# Patient Record
Sex: Female | Born: 1935
Health system: Southern US, Community
[De-identification: ages and names within clinical notes are randomized; demographics above are authoritative.]

## PROBLEM LIST (undated history)

## (undated) DIAGNOSIS — Z9889 Other specified postprocedural states: Secondary | ICD-10-CM

## (undated) DIAGNOSIS — K922 Gastrointestinal hemorrhage, unspecified: Secondary | ICD-10-CM

## (undated) DIAGNOSIS — K649 Unspecified hemorrhoids: Secondary | ICD-10-CM

## (undated) DIAGNOSIS — K5790 Diverticulosis of intestine, part unspecified, without perforation or abscess without bleeding: Secondary | ICD-10-CM

## (undated) DIAGNOSIS — J4489 Other specified chronic obstructive pulmonary disease: Secondary | ICD-10-CM

## (undated) DIAGNOSIS — E7800 Pure hypercholesterolemia, unspecified: Secondary | ICD-10-CM

## (undated) DIAGNOSIS — R112 Nausea with vomiting, unspecified: Secondary | ICD-10-CM

## (undated) DIAGNOSIS — R011 Cardiac murmur, unspecified: Secondary | ICD-10-CM

## (undated) DIAGNOSIS — D649 Anemia, unspecified: Secondary | ICD-10-CM

## (undated) DIAGNOSIS — R0902 Hypoxemia: Secondary | ICD-10-CM

## (undated) DIAGNOSIS — J449 Chronic obstructive pulmonary disease, unspecified: Secondary | ICD-10-CM

## (undated) DIAGNOSIS — T8859XA Other complications of anesthesia, initial encounter: Secondary | ICD-10-CM

## (undated) DIAGNOSIS — D122 Benign neoplasm of ascending colon: Secondary | ICD-10-CM

## (undated) DIAGNOSIS — I10 Essential (primary) hypertension: Secondary | ICD-10-CM

## (undated) DIAGNOSIS — Z923 Personal history of irradiation: Secondary | ICD-10-CM

## (undated) DIAGNOSIS — D120 Benign neoplasm of cecum: Secondary | ICD-10-CM

## (undated) HISTORY — PX: COLECTOMY: SHX59

## (undated) HISTORY — DX: Essential (primary) hypertension: I10

## (undated) HISTORY — DX: Anemia, unspecified: D64.9

## (undated) HISTORY — DX: Diverticulosis of intestine, part unspecified, without perforation or abscess without bleeding: K57.90

## (undated) HISTORY — DX: Chronic obstructive pulmonary disease, unspecified: J44.9

## (undated) HISTORY — DX: Unspecified hemorrhoids: K64.9

## (undated) HISTORY — DX: Benign neoplasm of cecum: D12.0

## (undated) HISTORY — DX: Personal history of irradiation: Z92.3

## (undated) HISTORY — DX: Other specified chronic obstructive pulmonary disease: J44.89

## (undated) HISTORY — PX: BUNIONECTOMY: SHX129

## (undated) HISTORY — DX: Cardiac murmur, unspecified: R01.1

## (undated) HISTORY — PX: TONSILLECTOMY: SUR1361

## (undated) HISTORY — DX: Gastrointestinal hemorrhage, unspecified: K92.2

## (undated) HISTORY — DX: Hypoxemia: R09.02

## (undated) HISTORY — DX: Benign neoplasm of ascending colon: D12.2

## (undated) HISTORY — PX: JOINT REPLACEMENT: SHX530

## (undated) HISTORY — DX: Pure hypercholesterolemia, unspecified: E78.00

---

## 2000-05-10 ENCOUNTER — Encounter: Admission: RE | Admit: 2000-05-10 | Discharge: 2000-05-10 | Payer: Self-pay | Admitting: Family Medicine

## 2000-05-10 ENCOUNTER — Encounter: Payer: Self-pay | Admitting: Family Medicine

## 2000-07-27 ENCOUNTER — Inpatient Hospital Stay (HOSPITAL_COMMUNITY): Admission: EM | Admit: 2000-07-27 | Discharge: 2000-07-30 | Payer: Self-pay | Admitting: Emergency Medicine

## 2000-07-27 ENCOUNTER — Encounter (INDEPENDENT_AMBULATORY_CARE_PROVIDER_SITE_OTHER): Payer: Self-pay

## 2001-03-02 ENCOUNTER — Encounter: Payer: Self-pay | Admitting: Cardiology

## 2001-03-02 ENCOUNTER — Ambulatory Visit (HOSPITAL_COMMUNITY): Admission: RE | Admit: 2001-03-02 | Discharge: 2001-03-02 | Payer: Self-pay | Admitting: Cardiology

## 2001-06-13 ENCOUNTER — Encounter: Payer: Self-pay | Admitting: Family Medicine

## 2001-06-13 ENCOUNTER — Encounter: Admission: RE | Admit: 2001-06-13 | Discharge: 2001-06-13 | Payer: Self-pay | Admitting: Family Medicine

## 2002-02-23 ENCOUNTER — Ambulatory Visit (HOSPITAL_COMMUNITY): Admission: RE | Admit: 2002-02-23 | Discharge: 2002-02-23 | Payer: Self-pay | Admitting: Internal Medicine

## 2002-02-23 ENCOUNTER — Encounter: Payer: Self-pay | Admitting: Internal Medicine

## 2002-02-26 ENCOUNTER — Ambulatory Visit (HOSPITAL_COMMUNITY): Admission: RE | Admit: 2002-02-26 | Discharge: 2002-02-26 | Payer: Self-pay | Admitting: Internal Medicine

## 2002-02-26 ENCOUNTER — Encounter: Payer: Self-pay | Admitting: Internal Medicine

## 2002-06-21 ENCOUNTER — Encounter: Payer: Self-pay | Admitting: Family Medicine

## 2002-06-21 ENCOUNTER — Ambulatory Visit (HOSPITAL_COMMUNITY): Admission: RE | Admit: 2002-06-21 | Discharge: 2002-06-21 | Payer: Self-pay | Admitting: Family Medicine

## 2003-01-01 ENCOUNTER — Ambulatory Visit (HOSPITAL_COMMUNITY): Admission: RE | Admit: 2003-01-01 | Discharge: 2003-01-01 | Payer: Self-pay | Admitting: Internal Medicine

## 2003-01-01 ENCOUNTER — Encounter: Payer: Self-pay | Admitting: Internal Medicine

## 2003-03-22 ENCOUNTER — Encounter: Payer: Self-pay | Admitting: Internal Medicine

## 2003-03-22 ENCOUNTER — Ambulatory Visit (HOSPITAL_COMMUNITY): Admission: RE | Admit: 2003-03-22 | Discharge: 2003-03-22 | Payer: Self-pay | Admitting: Internal Medicine

## 2003-03-25 ENCOUNTER — Encounter: Payer: Self-pay | Admitting: Internal Medicine

## 2003-03-25 ENCOUNTER — Ambulatory Visit (HOSPITAL_COMMUNITY): Admission: RE | Admit: 2003-03-25 | Discharge: 2003-03-25 | Payer: Self-pay | Admitting: Internal Medicine

## 2003-05-07 ENCOUNTER — Encounter: Payer: Self-pay | Admitting: Neurosurgery

## 2003-05-07 ENCOUNTER — Ambulatory Visit (HOSPITAL_COMMUNITY): Admission: RE | Admit: 2003-05-07 | Discharge: 2003-05-07 | Payer: Self-pay | Admitting: Neurosurgery

## 2003-08-13 ENCOUNTER — Encounter: Payer: Self-pay | Admitting: Internal Medicine

## 2003-08-13 ENCOUNTER — Ambulatory Visit (HOSPITAL_COMMUNITY): Admission: RE | Admit: 2003-08-13 | Discharge: 2003-08-13 | Payer: Self-pay | Admitting: Internal Medicine

## 2004-04-15 ENCOUNTER — Ambulatory Visit (HOSPITAL_COMMUNITY): Admission: RE | Admit: 2004-04-15 | Discharge: 2004-04-15 | Payer: Self-pay | Admitting: Internal Medicine

## 2004-04-16 ENCOUNTER — Ambulatory Visit (HOSPITAL_COMMUNITY): Admission: RE | Admit: 2004-04-16 | Discharge: 2004-04-16 | Payer: Self-pay | Admitting: Internal Medicine

## 2004-09-22 ENCOUNTER — Ambulatory Visit (HOSPITAL_COMMUNITY): Admission: RE | Admit: 2004-09-22 | Discharge: 2004-09-22 | Payer: Self-pay | Admitting: Internal Medicine

## 2005-04-23 ENCOUNTER — Ambulatory Visit (HOSPITAL_COMMUNITY): Admission: RE | Admit: 2005-04-23 | Discharge: 2005-04-23 | Payer: Self-pay | Admitting: Internal Medicine

## 2005-06-16 ENCOUNTER — Ambulatory Visit: Payer: Self-pay | Admitting: Internal Medicine

## 2005-07-07 ENCOUNTER — Ambulatory Visit: Payer: Self-pay | Admitting: Internal Medicine

## 2005-10-19 ENCOUNTER — Ambulatory Visit (HOSPITAL_COMMUNITY): Admission: RE | Admit: 2005-10-19 | Discharge: 2005-10-19 | Payer: Self-pay | Admitting: Internal Medicine

## 2006-05-24 ENCOUNTER — Ambulatory Visit (HOSPITAL_COMMUNITY): Admission: RE | Admit: 2006-05-24 | Discharge: 2006-05-24 | Payer: Self-pay | Admitting: Ophthalmology

## 2006-06-07 ENCOUNTER — Ambulatory Visit (HOSPITAL_COMMUNITY): Admission: RE | Admit: 2006-06-07 | Discharge: 2006-06-07 | Payer: Self-pay | Admitting: Ophthalmology

## 2006-06-28 ENCOUNTER — Observation Stay (HOSPITAL_COMMUNITY): Admission: EM | Admit: 2006-06-28 | Discharge: 2006-07-01 | Payer: Self-pay | Admitting: Emergency Medicine

## 2006-06-30 ENCOUNTER — Ambulatory Visit: Payer: Self-pay | Admitting: Internal Medicine

## 2006-11-16 ENCOUNTER — Ambulatory Visit (HOSPITAL_COMMUNITY): Admission: RE | Admit: 2006-11-16 | Discharge: 2006-11-16 | Payer: Self-pay | Admitting: Internal Medicine

## 2007-07-20 ENCOUNTER — Ambulatory Visit (HOSPITAL_COMMUNITY): Admission: RE | Admit: 2007-07-20 | Discharge: 2007-07-20 | Payer: Self-pay | Admitting: Internal Medicine

## 2007-07-21 ENCOUNTER — Ambulatory Visit (HOSPITAL_COMMUNITY): Admission: RE | Admit: 2007-07-21 | Discharge: 2007-07-21 | Payer: Self-pay | Admitting: Internal Medicine

## 2007-11-24 ENCOUNTER — Ambulatory Visit (HOSPITAL_COMMUNITY): Admission: RE | Admit: 2007-11-24 | Discharge: 2007-11-24 | Payer: Self-pay | Admitting: Internal Medicine

## 2008-08-20 ENCOUNTER — Ambulatory Visit (HOSPITAL_COMMUNITY): Admission: RE | Admit: 2008-08-20 | Discharge: 2008-08-20 | Payer: Self-pay | Admitting: Internal Medicine

## 2008-12-03 ENCOUNTER — Ambulatory Visit (HOSPITAL_COMMUNITY): Admission: RE | Admit: 2008-12-03 | Discharge: 2008-12-03 | Payer: Self-pay | Admitting: Internal Medicine

## 2009-09-23 ENCOUNTER — Ambulatory Visit (HOSPITAL_COMMUNITY): Admission: RE | Admit: 2009-09-23 | Discharge: 2009-09-23 | Payer: Self-pay | Admitting: Family Medicine

## 2010-03-02 ENCOUNTER — Ambulatory Visit (HOSPITAL_COMMUNITY): Admission: RE | Admit: 2010-03-02 | Discharge: 2010-03-02 | Payer: Self-pay | Admitting: Internal Medicine

## 2011-04-02 NOTE — Discharge Summary (Signed)
Kathleen Pope, Kathleen Pope                  ACCOUNT NO.:  [id number]   MEDICAL RECORD NO.:  [id number]          PATIENT TYPE:  INP   LOCATION:  1621                         FACILITY:  [facility]   PHYSICIAN:  Lonia Blood, M.D.      DATE OF BIRTH:  [birth year]   DATE OF ADMISSION:  06/28/2006  DATE OF DISCHARGE:  07/01/2006                                 DISCHARGE SUMMARY   PRIMARY CARE PHYSICIAN:  Madelin Rear. Sherwood Gambler, M.D.   GASTROENTEROLOGIST:  Barbette Hair. Arlyce Dice, MD, [facility]   DISCHARGE DIAGNOSES:  1. Lower gastrointestinal bleed secondary to an arteriovenous      malformation.  2. Acute blood loss anemia.  3. Hypertension.  4. Tobacco abuse.   DISCHARGE MEDICATIONS:  1. Norvasc 5 mg daily.  2. Iron tablet, two tablets daily.  3. Centrum Silver one daily.  4. Calcium one daily.  5. Echinacea one daily.   DISPOSITION:  The patient is currently stable.  Her hemoglobin is stable at  10.4.  No further bleeds and back to her baseline.  She will be discharged  home to resume her care with her primary care physician and schedule  followup with Dr. Arlyce Dice as needed.   PROCEDURES PERFORMED:  Colonoscopy performed by Dr. Melvia Heaps on June 29, 2006 that showed a bleeding AVM in the cecum.  Laser therapy was used to  ablate it, and it was obliterated.  Another cecal AVM was not bleeding and  was also ablated.   CONSULTATIONS:  Dr. Melvia Heaps, [facility].   HISTORY OF PRESENT ILLNESS:  Please refer to the dictated history and  physical by Dr. Isidor Holts.  In short, however, this is a 77 year old  female who has a prior history of lower GI bleed due to AVM and also history  of hypertension and tobacco use.  She came in secondary to having  hematochezia for a couple of days, with no history of black stools.  The  patient was subsequently admitted for management of lower GI bleed.   HOSPITAL COURSE:  Problem 1.  Lower GI bleed.  The patient was admitted to  the intensive  care unit.  She had two wide bore IV's and was started on IV  Protonix.  Serial H&H's were followed.  GI was consulted.  She subsequently  underwent colonoscopy as indicated above, and a bleeding AVM was found in  the cecal region which has been ablated.  Since then, she has been stable,  with her hemoglobin very much stable.  She will therefore be discharged on  her current therapy.  She will have followup as needed with both Dr. Arlyce Dice  and Dr. Sherwood Gambler.   Problem 2.  Acute blood loss anemia.  The patient's hemoglobin on admission  was 12.1.  It gradually dropped to 10.7 and 11.6.  Since her procedure, her  hemoglobin has stayed stable at 10.4.  She will probably need a check in a  few weeks.  She had longstanding anemia and has been taking an iron tablet,  and she should continue.   Problem 3.  Hypertension.  Her blood pressure was for the most part  controlled.  She has been placed back on her low-dose Norvasc.   Problem 4.  Tobacco abuse.  The patient received nicotine patch in the  hospital.  She has been counseled extensively regarding smoking as well.   Otherwise, all medical issues are stable.   DISCHARGE LABORATORY DATA:  White count 6.3, hemoglobin 10.4, platelets 203.      Lonia Blood, M.D.  Electronically Signed     LG/MEDQ  D:  07/01/2006  T:  07/01/2006  Job:  [id number]

## 2011-04-02 NOTE — H&P (Signed)
NAMELILIANAH, Kathleen Pope                  ACCOUNT NO.:  [id number]   MEDICAL RECORD NO.:  [id number]          PATIENT TYPE:  INP   LOCATION:  0104                         FACILITY:  [facility]   PHYSICIAN:  Isidor Holts, M.D.  DATE OF BIRTH:  1933-08-18   DATE OF ADMISSION:  06/28/2006  DATE OF DISCHARGE:                                HISTORY & PHYSICAL   PRIMARY CARE PHYSICIAN:  Madelin Rear. Sherwood Gambler, M.D., [place], [place]  [place].   PRIMARY GASTROENTEROLOGIST:  Wilhemina Bonito. Marina Goodell, M.D.   CHIEF COMPLAINT:  Passage of blood in the stools.   HISTORY OF PRESENT ILLNESS:  This is a 77 year old female. For Past Medical  History, see below.  The patient has had episodes of lower GI bleed in the  past, i.e. 2000-2001 and is currently on long-term oral iron therapy, for  iron-deficiency anemia.  According to the patient, she had a colonoscopy in  October 2006, which was reportedly normal.  She had been quite well until  morning of June 28, 2006, when following breakfast, she went to the  bathroom to move her bowels and had formed stools with some blood on it.  Later, following lunch at about 12:30 p.m., she passed what she felt was  diarrhea, but was really bright red blood which filled the bowl.  Had some  loose motions. She had some lower abdominal pain just prior, but none since.  She has had no further bloody stools since.  Denies vomiting or nausea.   PAST MEDICAL HISTORY:  1. History of lower GI bleed in 2001 secondary to cecal AVM, confirmed by      colonoscopy and treated with hemostatic Bicap therapy.  2. Lower GI bleed late 2000, status post sigmoid resection early 2001 for      presumed diverticular source.  3. Iron-deficiency anemia.  4. History of hemorrhoids.  5. Hypertension.  6. History of prior foot surgery.  7. Status post bilateral cataract surgery with lens implants,      approximately 1 month ago.  8. History of esophageal ring confirmed by EGD September 2001.  9.  Smoker.   MEDICATION HISTORY.:  1. Norvasc 5 mg p.o. daily.  2. Iron tablets 2 pills p.o. daily.  3. Centrum Silver multivitamins one p.o. daily.  4. Calcium supplements one p.o. daily.  5. Echinacea over-the-counter one p.o. daily.   ALLERGIES:  No known drug allergies.   REVIEW OF SYSTEMS:  As per HPI and Chief Complaint, otherwise negative.   SOCIAL HISTORY:  The patient is married, has one daughter.  She is a smoker,  approximately one pack of cigarettes per day, has been smoking for about 50  years.  Denies alcohol use or drug abuse. 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   FAMILY HISTORY:  Is noncontributory.  The patient's mother died at age 61.  She had a history of breast cancer.  Her father died at age 96 years from  emphysema.  He had a history of prostate cancer.   PHYSICAL EXAMINATION:  VITAL SIGNS:  Temperature 100.5, pulse 97 per minute  and regular, respiratory 18, blood pressure 121/80 mmHg, pulse oximeter  100%.  GENERAL:  The patient does not appear to be in obvious acute distress.  Alert, communicative, not short of breath at rest.  HEENT: No clinical pallor or jaundice.  No conjunctival injection.  NECK: Supple.  JVP not seen.  No palpable lymphadenopathy.  No palpable  goiter.  CHEST:  Clinically clear to auscultation.  No wheezes or crackles.  HEART:  Heart sounds 1 and 2 heard, normal, regular, no murmurs.  ABDOMEN: Full, soft and nontender.  Old surgical scar is noted in the lower  abdomen, infraumbilically. There is no palpable organomegaly.  No palpable  masses.  Normal bowel sounds.  LOWER EXTREMITY EXAMINATION:  No pitting edema.  Palpable peripheral pulses.  MUSCULOSKELETAL: Examination was quite unremarkable.  CENTRAL NERVOUS SYSTEM: No focal neurologic deficit on gross examination.   INVESTIGATIONS:  CBC: WBC 6.8, hemoglobin 12.1, hematocrit 36.4, MCV 95.6,  platelets 234.  Electrolytes: Sodium 143,  potassium 3.9, chloride 108, CO2  30, BUN 15, creatinine 0.8, glucose 104. INR 0.9.   ASSESSMENT AND PLAN:  1. Lower gastrointestinal bleed. Query source.  Differential diagnostic      considerations include diverticula, hemorrhoids recurrent arteriovenous      malformation.  Reportedly normal colonoscopic findings in October 2006,      militate against malignancy or neoplasm.  Since the patient appears to      have active bleeding, we shall admit for close monitoring, to the step-      down unit, do serial hemoglobin and hematocrit and request GI      consultation for workup.   1. Hypertension.  This appears controlled at present.  We shall monitor,      and institute antihypertensive medications as indicated.   1. Smoking history.  The patient will be counseled appropriately, and we      shall utilize Nicoderm CQ patch.   Further management will depend on clinical course.      Isidor Holts, M.D.  Electronically Signed     CO/MEDQ  D:  06/28/2006  T:  06/28/2006  Job:  [id number]   cc:   Madelin Rear. Sherwood Gambler, MD  Fax: [phone]   Wilhemina Bonito. Marina Goodell, M.D. [facility]  [address]  [place]  [place] [address]

## 2011-04-02 NOTE — Consult Note (Signed)
Kathleen Pope, Kathleen Pope                  ACCOUNT NO.:  [id number]   MEDICAL RECORD NO.:  [id number]          PATIENT TYPE:  INP   LOCATION:  0165                         FACILITY:  [facility]   PHYSICIAN:  Iva Boop, MD,FACGDATE OF BIRTH:  10/20/1933   DATE OF CONSULTATION:  06/28/2006  DATE OF DISCHARGE:                                   CONSULTATION   REFERRING PHYSICIAN:  Isidor Holts, M.D.   PRIMARY CARE PHYSICIAN:  Kathleen Pope, M.D.   REASON FOR CONSULTATION:  GI bleed.   ASSESSMENT:  Hematochezia/gastrointestinal bleed which I suspect is a lower  bleed.  The patient has a past history of bleeding from cecal angiodysplasia  we should say and also has diverticulosis, both of which could be causing  bleeding.  Her BUN is 15 and creatinine 0.8 which goes against an upper  gastrointestinal bleed.  She is currently hemodynamically stable on a  hemoglobin of 12.   RECOMMENDATIONS/PLAN:  1. Agree with admission.  2. She should have a colonoscopy and possible upper gastrointestinal      endoscopy tomorrow.  We will plan to prep her tomorrow for colonoscopy      to sort out what is going on here.  She may need ablation of AVMs.  She      did have a colonoscopy just last year in August of 2006 which      demonstrated no significant abnormalities.  No diverticulosis was      described but it is possible that she could have diverticulosis not      seen previously.  She is status post a segmental resection for a      presumed diverticular bleed prior to the discovery of her cecal      angiodysplasia.   HISTORY:  A 77 year old woman that was in her usual state of health,  actually had her physical last week and was doing fine.  Today, she passed  some bright red blood and then had more with a diarrheal and bloody stool  with a fairly large amount.  There has been no melena.  Her stools have been  iron colored.  She was hemoccult positive here in the emergency department.  Laboratory studies as above.  She has not been lightheaded or had abdominal  pain or vomiting or change in bowel habits or any significant weight loss.   GI review of systems otherwise negative.   MEDICATIONS:  1. Norvasc 5 mg daily.  2. Iron tablets 2 a day.  3. Centrum Silver daily.  4. Calcium daily.  5. Echinacea.   ALLERGIES:  None known.   PAST MEDICAL HISTORY:  1. As above regarding GI bleeding.  Note, she did have complaints of      bright red blood per rectum prior to her colonoscopy in August of 2006      but there was no mention of hemorrhoid on that exam.  2. Hypertension.  3. Prior sigmoid resection for a presumed diverticular bleed.  4. History of hemorrhoids reported.  5. Hypertension.  6. Prior foot surgery.  7. Cataract  surgery and lens implant one month ago.  8. Prior esophageal ring dilated by EGD 2001.  9. Smoker.   SOCIAL HISTORY:  Married, one daughter, lives with her husband.  She is  retired from [employer].  She is a smoker.  No alcohol.   FAMILY HISTORY:  No colon cancer.   REVIEW OF SYSTEMS:  As above.  Otherwise all other systems are negative.   PHYSICAL EXAMINATION:  GENERAL:  Reveals a pleasant petite fit-appearing  elderly white woman in no acute distress.  VITAL SIGNS:  Temperature 98.2, blood pressure 128/70, pulse 78,  respirations 18.  HEENT:  Eyes anicteric.  Mouth free of lesions.  NECK:  Supple.  No thyromegaly or mass.  CHEST:  Clear.  HEART:  S1 and S2.  No rubs or gallops.  Somewhat distant heart sounds.  ABDOMEN:  Thin, soft and nontender.  RECTAL:  Not repeated.  EXTREMITIES:  No edema.  SKIN:  Tanned.  LYMPH NODES:  No neck or supraclavicular nodes.   ADDITIONAL LAB DATA:  INR 0.9.  Remainder of her BMET looks normal.  White  count and platelets normal.  MCV 95.   I appreciate the opportunity to care for this patient.  Dr. Brien Few was covering  for Dr. Sherwood Pope.      Iva Boop, MD,FACG  Electronically Signed      CEG/MEDQ  D:  06/28/2006  T:  06/29/2006  Job:  [id number]   cc:   Kathleen Rear. Sherwood Gambler, MD  Fax: [phone]

## 2011-04-02 NOTE — Procedures (Signed)
[place]  Patient:    Kathleen Pope, Kathleen Pope                           MRN: [id number] Proc. Date: 07/29/00 Adm. Date:  [id number] Disc. Date: [id number] Attending:  Estella Husk CC:         Patrica Duel, M.D., [place], [place]  Dr. Audie Pinto, [place]   Procedure Report  PROCEDURE PERFORMED:  Esophagogastroduodenoscopy  INDICATION:  GI bleeding and iron deficiency anemia.  HISTORY:  A 77 year old female with recurrent GI bleeding and anemia.  She just underwent colonoscopy.  See that dictation. She is now for upper endoscopy.  PHYSICAL EXAMINATION:  Completed prior to colonoscopy. See that dictation.  DESCRIPTION OF PROCEDURE:  After completing colonoscopy, the patient remained in left lateral decubitus position.  Additional sedation was provided with Versed 2.5 mg IV.  The Olympus endoscope was passed orally under direct vision into the esophagus.  The esophagus revealed an incidental fibrous ring at the gastroesophageal junction.  The endoscope passed beyond this without resistance.  The stomach, duodenal bulb, and post bulbar duodenum to the third portion appeared normal.  No evidence of vascular malformation or other mucosal lesion.  IMPRESSION:   Fibrous esophageal ring.  Otherwise normal upper endoscopy. Recent problems with bleeding and anemia secondary to cecal arteriovenous malformation (see colonoscopy report). DD:  07/29/00 TD:  08/01/00 Job: [id number]

## 2011-04-02 NOTE — Procedures (Signed)
[place]  Patient:    Kathleen Pope, Kathleen Pope                           MRN: [id number] Proc. Date: 07/29/00 Adm. Date:  [id number] Disc. Date: [id number] Attending:  Estella Husk CC:         Patrica Duel, M.D., [place], [place]  Dr. Sherwood Gambler, M.D., [place], [place]   Procedure Report  PROCEDURE PERFORMED:  Colonoscopy with polypectomy and hemostatic therapy.  INDICATION:  Recurrent GI bleed.  HISTORY:  This is a 77 year old female with recurrent GI bleeding and recurrent anemia.  Earlier this year, she underwent segmental resection of her sigmoid colon as diverticular source was felt the cause for bleeding.  She was admitted to our hospital two days with recurrent lower GI bleeding.  She was also noted to have iron deficiency anemia.  She has been transfused.  She is now for colonoscopy and upper endoscopy.  The nature of the procedure, as well as the risks, benefits and alternatives were reviewed.  She understood and agreed to proceed.  PHYSICAL EXAMINATION:  GENERAL:  Thin female in no acute distress.  She is alert and oriented.  VITAL SIGNS:  Stable.  LUNGS: Clear.  HEART:  Regular.  ABDOMEN:  Soft.  DESCRIPTION OF PROCEDURE:  After informed consent was obtained, the patient was sedated with 100 mcg of Fentanyl and 7.5 mg of Versed IV.  Rectal exam was performed and revealed no external hemorrhoids.  The Olympus colonoscope was then passed under direct vision per rectum and advanced through the entire length of the colon to the cecal tip.  Preparation was excellent.  In the base of the cecum was a small actively bleeding vascular malformation.  A 7 French bicap probe was used to coagulate the lesion.  Complete hemostasis was achieved.  Photographs were taken pre and post therapy.  The remainder of the colon was unremarkable.  She did have prominent vascular pattern throughout. Nothing that would be characterized as true vascular  malformation.  At approximately 25 cm from the anal verge was healthy appearing surgical anastomosis.  In the rectum was 8 mm adenomatous appearing polyp which was removed with hot biopsy forceps and submitted for pathologic analysis. Retroflexed view of the rectum demonstrated small internal hemorrhoids.  In addition, middle hemorrhoids were noted.  IMPRESSION: 1. Iron deficiency and recurrent lower gastrointestinal bleeding secondary    to vascular malformation of the cecum status post endoscopic hemostatic    therapy. 2. Rectal polyp status post polypectomy. 3. Status post sigmoid colectomy. 4. Innocuous appearing hemorrhoids.  RECOMMENDATIONS: 1. Continue iron therapy t.i.d 2. Observe for evidence of rebleeding. 3. Avoid aspirin and nonsteroidal products. 4. Follow up polyp pathology.  If adenomatous than repeat colonoscopy    in three years. 5. Proceed to upper endoscopy to exclude upper gastrointestinal vascular    malformations. DD:  07/29/00 TD:  08/01/00 Job: [id number]

## 2011-04-02 NOTE — Discharge Summary (Signed)
[place]  Patient:    Kathleen Pope, Kathleen Pope                           MRN: [id number] Adm. Date:  [id number] Disc. Date: [id number] Attending:  Estella Husk Dictator:   Dianah Field, P.A. CC:         Conception Chancy, M.D., [address], [place], [place]  Patrica Duel, M.D., [address], [place], [place] [address]  Barbette Hair. Arlyce Dice, M.D. [facility]   Discharge Summary  ADMISSION DIAGNOSES: 1. Lower gastrointestinal bleed.  Rule out diverticula, rule out hemorrhoidal,    rule out ischemic, though doubt the latter because this is painless and the    bleeding has been chronic.  Rule out arteriovenous malformations, less    likely this represents and upper gastrointestinal source. 2. Anemia, symptomatic with presyncopal symptoms. 3. Slightly increased blood urea nitrogen.  Rule out secondary to volume    contraction, prerenal source versus upper gastrointestinal bleeding source. 4. Status post sigmoid resection for bleeding presumed to be diverticular in    source. 5. History of hemorrhoids to which bleeding has been attributed in the past. 6. Status post foot surgery. 7. Hypertension. 8. Headaches. 9. Status post foot surgery.  DISCHARGE DIAGNOSES: 1. Acute gastrointestinal bleeding of large volumes secondary to bleeding    cecal arteriovenous malformation.  Status post colonoscopy with hemostatic    therapy with hemostatic bicap therapy. 2. Status post rectal polypectomy with pathology pending at the time of this    dictation. 3. Anemia requiring transfusion with 3 units of packed red blood cells during    this admission.  Hemoglobin and hematocrit stable following these    transfusions. 4. Elevated blood urea nitrogen, resolved with hydration and transfusions. 5. Elevated glucose during this admission.  Rule out glucose intolerance/adult    onset diabetes mellitus.  This was not worked up during this admission.  CONSULTATIONS:   None.  PROCEDURES: 1. Colonoscopy with Dr. Yancey Flemings on July 29, 2000:  This showed    anastomosis at 25 cm, a rectal polyp was removed with hot biopsy and sent    for pathology.  In the cecum a bleeding AVM was seen.  This was treated    with bicap therapy. 2. Upper endoscopy on July 27, 2000:  This showed incidental esophageal    ring; otherwise, the study was normal to the third duodenum with no    evidence for AVM.  BRIEF HISTORY:  Kathleen Pope is a 77 year old white female with a history of GI bleeding attributed to both diverticulosis and hemorrhoids in the past.  She had a fairly large bleed in the spring of 2001 and ended up having a sigmoid colon resection for bleeding which was presumed to be diverticular in origin. Dr. Lovell Sheehan performed this in [place] in March 2001.  Endoscopy studies have included colonoscopy about three to four years ago by Dr. Arlyce Dice at which time hemorrhoids were found and felt to be the source of her intermittent mild to moderate bleeding.  Dr. Lovell Sheehan has also performed colonoscopies on the patient in the past.  Since her surgery the patient has had resumption of mild to moderate rectal bleeding which is pretty well controlled with hemorrhoid therapy.  She got frustrated with this and bleeding was becoming somewhat refractory to suppository therapy so she went back to see Dr. Arlyce Dice about a week ago in the office.  He set  her up for colonoscopy and endoscopy in about two weeks; however, on the evening prior to admission and on the morning of admission the patient developed more large scale painless hematochezia.  She became dizzy and weak.  She went to see Dr. Carver Fila at his office in [place].  He sent her to the hospital for transfusions.  However, there were no blood products available for transfusion for this patient so she was sent on to the emergency room at [facility].  There she was intercepted to the emergency department  by Dr. Yancey Flemings, covering the hospital for Dr. Arlyce Dice.  A review of the records show that her blood pressure had been 90/60 during the ambulance ride with a pulse of 110.  She was started on IV fluids with a bolus of normal saline while being transported.  By the time she arrived at [facility] blood pressure was 124/96 with a pulse of 96.  She was not unstable during transport or in the ER.  Dr. Marina Goodell admitted her for transfusions, telemetry monitoring, and studies to include colonoscopy and upper endoscopy once she stabilized and the bleeding slowed down.  Gastric irritant medication product usage includes aspirin daily along with BCs and Aleve on occasion which amounts to about one to two times a week.  She had been on Nu-Iron once daily, but Dr. Arlyce Dice had asked her to stop taking this in anticipation of colonoscopy.  In the emergency room labs did reveal a hemoglobin of 6.3.  Apparently about a month ago it had been somewhere in the 10 to 10.5 range.  Coags were normal.  Her MCV was just minimally reduced.  LABORATORY AND X-RAY DATA:  Hemoglobin was 6.3 on admission and measured 11.8 at discharge.  Hematocrit initially 20.6, 36.5 at discharge.  White blood cell count was 9.6, platelets were 259,000.  PT 13, INR 1.0, PTT 23.  Chemistries were notable for initial BUN of 26, corrected to 15.  Albumin low at 3.1. Bilirubin, alkaline phosphatase, AST, and ALT all within normal limits.  Serum glucoses read 123 and 140.  Sodium, potassium, creatinine were within normal limits.  HOSPITAL COURSE:  The afternoon that the patient came into the hospital she began transfusions with packed red blood cells.  She received a total of 3 units over the next 18 hours.  The following morning her hemoglobin measured 12.1.  Review of her vital signs showed these to be stable without any tachycardia or hypotension.  She had had no further bleeding since she had come to the emergency room.  She was  feeling stronger and had no further  dizziness.  The afternoon of hospital day #2 she drank GoLYTELY prep without incidence.  The following day the colonoscopy and upper endoscopy were performed.  Findings are noted above.  Dr. Marina Goodell had found the source of the bleeding to be a bleeding cecal AVM.  She had no evidence for further diverticular disease.  Anastomosis was widely patent.  He also did remove a rectal polyp.  The pathology was sent, but is pending at time of this dictation.  As the patient has been very stable since her admission and this includes vital signs as well as blood counts, plan is for discharge on the morning of September 15 if she has no further problems.  FOLLOWUP:  Planned follow-up is with Dr. Marina Goodell on Tuesday, October 2 in the afternoon.  DISCHARGE INSTRUCTIONS:  In the meantime she is to follow a low residue diet for  approximately 10 days.  She is to avoid any aspirin or any nonsteroidal products.  She is to call the office if she has any further GI bleeding problems.  MEDICATIONS AT DISCHARGE: 1. Iron sulfate 325 mg one p.o. t.i.d. 2. Norvasc 5 mg p.o. q.d. 3. Tylenol p.r.n.  CONDITION ON DISCHARGE:  Her condition at the time of this dictation was stable. DD:  07/29/00 TD:  08/01/00 Job: [id number]

## 2011-04-22 ENCOUNTER — Other Ambulatory Visit (HOSPITAL_COMMUNITY): Payer: Self-pay | Admitting: Internal Medicine

## 2011-04-22 DIAGNOSIS — Z139 Encounter for screening, unspecified: Secondary | ICD-10-CM

## 2011-05-04 ENCOUNTER — Ambulatory Visit (HOSPITAL_COMMUNITY)
Admission: RE | Admit: 2011-05-04 | Discharge: 2011-05-04 | Disposition: A | Discharge status: Home / Self Care | Payer: Medicare Other | Source: Ambulatory Visit | Attending: Internal Medicine | Admitting: Internal Medicine

## 2011-05-04 DIAGNOSIS — Z139 Encounter for screening, unspecified: Secondary | ICD-10-CM

## 2011-05-04 DIAGNOSIS — Z1231 Encounter for screening mammogram for malignant neoplasm of breast: Secondary | ICD-10-CM | POA: Insufficient documentation

## 2011-07-23 ENCOUNTER — Other Ambulatory Visit (HOSPITAL_COMMUNITY): Payer: Self-pay | Admitting: Internal Medicine

## 2011-07-23 DIAGNOSIS — J449 Chronic obstructive pulmonary disease, unspecified: Secondary | ICD-10-CM

## 2011-07-23 DIAGNOSIS — Z01419 Encounter for gynecological examination (general) (routine) without abnormal findings: Secondary | ICD-10-CM

## 2011-07-23 DIAGNOSIS — Z0000 Encounter for general adult medical examination without abnormal findings: Secondary | ICD-10-CM

## 2011-07-27 ENCOUNTER — Ambulatory Visit (HOSPITAL_COMMUNITY)
Admission: RE | Admit: 2011-07-27 | Discharge: 2011-07-27 | Disposition: A | Discharge status: Home / Self Care | Payer: Medicare Other | Source: Ambulatory Visit | Attending: Internal Medicine | Admitting: Internal Medicine

## 2011-07-27 DIAGNOSIS — I6529 Occlusion and stenosis of unspecified carotid artery: Secondary | ICD-10-CM | POA: Insufficient documentation

## 2011-07-27 DIAGNOSIS — R0989 Other specified symptoms and signs involving the circulatory and respiratory systems: Secondary | ICD-10-CM | POA: Insufficient documentation

## 2011-07-27 DIAGNOSIS — J449 Chronic obstructive pulmonary disease, unspecified: Secondary | ICD-10-CM

## 2011-07-27 DIAGNOSIS — Z0000 Encounter for general adult medical examination without abnormal findings: Secondary | ICD-10-CM

## 2011-07-27 DIAGNOSIS — Z87891 Personal history of nicotine dependence: Secondary | ICD-10-CM | POA: Insufficient documentation

## 2011-07-27 DIAGNOSIS — H538 Other visual disturbances: Secondary | ICD-10-CM | POA: Insufficient documentation

## 2011-07-27 DIAGNOSIS — Z01419 Encounter for gynecological examination (general) (routine) without abnormal findings: Secondary | ICD-10-CM

## 2011-07-27 DIAGNOSIS — I10 Essential (primary) hypertension: Secondary | ICD-10-CM | POA: Insufficient documentation

## 2011-07-27 DIAGNOSIS — R55 Syncope and collapse: Secondary | ICD-10-CM | POA: Insufficient documentation

## 2011-08-13 ENCOUNTER — Other Ambulatory Visit (HOSPITAL_COMMUNITY): Payer: Self-pay | Admitting: Internal Medicine

## 2011-08-17 ENCOUNTER — Ambulatory Visit (HOSPITAL_COMMUNITY): Payer: Medicare Other

## 2012-05-02 ENCOUNTER — Other Ambulatory Visit (HOSPITAL_COMMUNITY): Payer: Self-pay | Admitting: Internal Medicine

## 2012-05-02 DIAGNOSIS — Z139 Encounter for screening, unspecified: Secondary | ICD-10-CM

## 2012-05-05 ENCOUNTER — Ambulatory Visit (HOSPITAL_COMMUNITY)
Admission: RE | Admit: 2012-05-05 | Discharge: 2012-05-05 | Disposition: A | Discharge status: Home / Self Care | Payer: Medicare Other | Source: Ambulatory Visit | Attending: Internal Medicine | Admitting: Internal Medicine

## 2012-05-05 DIAGNOSIS — Z1231 Encounter for screening mammogram for malignant neoplasm of breast: Secondary | ICD-10-CM | POA: Insufficient documentation

## 2012-05-05 DIAGNOSIS — Z139 Encounter for screening, unspecified: Secondary | ICD-10-CM

## 2012-08-01 ENCOUNTER — Other Ambulatory Visit (HOSPITAL_COMMUNITY): Payer: Self-pay | Admitting: Internal Medicine

## 2012-08-01 ENCOUNTER — Ambulatory Visit (HOSPITAL_COMMUNITY)
Admission: RE | Admit: 2012-08-01 | Discharge: 2012-08-01 | Disposition: A | Discharge status: Home / Self Care | Payer: Medicare Other | Source: Ambulatory Visit | Attending: Internal Medicine | Admitting: Internal Medicine

## 2012-08-01 DIAGNOSIS — M899 Disorder of bone, unspecified: Secondary | ICD-10-CM | POA: Insufficient documentation

## 2012-08-01 DIAGNOSIS — R52 Pain, unspecified: Secondary | ICD-10-CM

## 2012-08-01 DIAGNOSIS — M949 Disorder of cartilage, unspecified: Secondary | ICD-10-CM | POA: Insufficient documentation

## 2012-08-01 DIAGNOSIS — M25539 Pain in unspecified wrist: Secondary | ICD-10-CM | POA: Insufficient documentation

## 2012-08-03 ENCOUNTER — Encounter: Payer: Self-pay | Admitting: *Deleted

## 2012-08-04 ENCOUNTER — Encounter: Payer: Self-pay | Admitting: Cardiovascular Disease

## 2012-08-04 ENCOUNTER — Ambulatory Visit (INDEPENDENT_AMBULATORY_CARE_PROVIDER_SITE_OTHER): Payer: Medicare Other | Admitting: Cardiovascular Disease

## 2012-08-04 VITALS — BP 145/84 | HR 82 | Ht 62.0 in | Wt 102.0 lb

## 2012-08-04 DIAGNOSIS — R011 Cardiac murmur, unspecified: Secondary | ICD-10-CM | POA: Insufficient documentation

## 2012-08-04 DIAGNOSIS — I350 Nonrheumatic aortic (valve) stenosis: Secondary | ICD-10-CM

## 2012-08-04 DIAGNOSIS — R0989 Other specified symptoms and signs involving the circulatory and respiratory systems: Secondary | ICD-10-CM

## 2012-08-04 DIAGNOSIS — F17210 Nicotine dependence, cigarettes, uncomplicated: Secondary | ICD-10-CM | POA: Insufficient documentation

## 2012-08-04 DIAGNOSIS — E782 Mixed hyperlipidemia: Secondary | ICD-10-CM | POA: Insufficient documentation

## 2012-08-04 DIAGNOSIS — F17200 Nicotine dependence, unspecified, uncomplicated: Secondary | ICD-10-CM

## 2012-08-04 DIAGNOSIS — I359 Nonrheumatic aortic valve disorder, unspecified: Secondary | ICD-10-CM

## 2012-08-04 NOTE — Patient Instructions (Signed)
Your physician recommends that you schedule a follow-up appointment in: 6 months  Your physician has requested that you have a carotid duplex. This test is an ultrasound of the carotid arteries in your neck. It looks at blood flow through these arteries that supply the brain with blood. Allow one hour for this exam. There are no restrictions or special instructions.  Your physician has requested that you have an echocardiogram. Echocardiography is a painless test that uses sound waves to create images of your heart. It provides your doctor with information about the size and shape of your heart and how well your heart's chambers and valves are working. This procedure takes approximately one hour. There are no restrictions for this procedure.

## 2012-08-04 NOTE — Progress Notes (Signed)
Patient ID: Kathleen Pope, female   DOB: March 09, 1933, 78 y.o.   MRN: [id number] 78 yo smoking since age 39.  No motivation to quit.  Referred by Dr Sherwood Gambler for bruit and murmur.  She indicates these last evaluated 3-4 years ago but I have no records.  No TIA or stroke symptoms.  No chest pain or history of CAD.  Has had murmur before.  She has no claudication.  Compliant with meds and on statin.  Widowed last two years but daughter lives near by.  Mild dyspnea with exertion.  No palpitations or syncope  ROS: Denies fever, malais, weight loss, blurry vision, decreased visual acuity, cough, sputum, SOB, hemoptysis, pleuritic pain, palpitaitons, heartburn, abdominal pain, melena, lower extremity edema, claudication, or rash.  All other systems reviewed and negative   General: Affect appropriate Thin bronchitic female HEENT: normal Neck supple with no adenopathy JVP normal bilateral  bruits no thyromegaly Lungs clear with no wheezing and good diaphragmatic motion Heart:  S1/S2 mild to moderate AS  murmur,rub, gallop or click PMI normal Abdomen: benighn, BS positve, no tenderness, no AAA no bruit.  No HSM or HJR Distal pulses intact with no bruits No edema Neuro non-focal Skin warm and dry No muscular weakness  Medications Current Outpatient Prescriptions  Medication Sig Dispense Refill  . amLODipine (NORVASC) 5 MG tablet Take 5 mg by mouth daily.      Marland Kitchen aspirin 81 MG tablet Take 81 mg by mouth daily.      . calcium carbonate (OS-CAL) 600 MG TABS Take 600 mg by mouth daily.      . ciprofloxacin (CIPRO) 500 MG tablet Take 500 mg by mouth 2 (two) times daily.      . ferrous sulfate 325 (65 FE) MG EC tablet Take 325 mg by mouth 3 (three) times daily with meals.      . fish oil-omega-3 fatty acids 1000 MG capsule Take 1 g by mouth daily.      . Multiple Vitamin (MULTIVITAMIN) tablet Take 1 tablet by mouth daily.      . pravastatin (PRAVACHOL) 10 MG tablet Take 10 mg by mouth daily.         Allergies Review of patient's allergies indicates no known allergies.  Family History: Family History  Problem Relation Age of Onset  . Cancer Mother     Breast Ca  . Cancer Father     Lung Ca  . Cancer Sister     Breast Ca    Social History: History   Social History  . Marital Status: Widowed    Spouse Name: N/A    Number of Children: N/A  . Years of Education: N/A   Occupational History  . Not on file.   Social History Main Topics  . Smoking status: Current Every Day Smoker -- 0.8 packs/day for 59 years    Types: Cigarettes  . Smokeless tobacco: Not on file  . Alcohol Use: No  . Drug Use: No  . Sexually Active: Not on file   Other Topics Concern  . Not on file   Social History Narrative  . No narrative on file    Electrocardiogram:  NSR rate 79  nonspecfic T wave changes  Assessment and Plan

## 2012-08-04 NOTE — Assessment & Plan Note (Signed)
Counseled for less than 10 minutes no motivation to quit.   

## 2012-08-04 NOTE — Assessment & Plan Note (Signed)
Bilateral carotid bruits  Wants duplex done at [facility]  Discussed criteria for surgery  If stenosis more than 40 % by velocity criteria should have duplex q 6 months.  ASA  Gave her Dr Lacinda Axon name for future reference  Relation of smoking to vascular disease discussed

## 2012-08-04 NOTE — Assessment & Plan Note (Signed)
Cholesterol is at goal.  Continue current dose of statin and diet Rx.  No myalgias or side effects.  F/U  LFT's in 6 months. No results found for this basename: LDLCALC  Labs with primary            

## 2012-08-04 NOTE — Assessment & Plan Note (Signed)
Mild to moderate AS murmur No symptoms F/U echo

## 2012-08-07 ENCOUNTER — Ambulatory Visit (HOSPITAL_COMMUNITY)
Admission: RE | Admit: 2012-08-07 | Discharge: 2012-08-07 | Disposition: A | Discharge status: Home / Self Care | Payer: Medicare Other | Source: Ambulatory Visit | Attending: Cardiovascular Disease | Admitting: Cardiovascular Disease

## 2012-08-07 DIAGNOSIS — F17200 Nicotine dependence, unspecified, uncomplicated: Secondary | ICD-10-CM | POA: Insufficient documentation

## 2012-08-07 DIAGNOSIS — I359 Nonrheumatic aortic valve disorder, unspecified: Secondary | ICD-10-CM

## 2012-08-07 DIAGNOSIS — I350 Nonrheumatic aortic (valve) stenosis: Secondary | ICD-10-CM

## 2012-08-07 DIAGNOSIS — R0989 Other specified symptoms and signs involving the circulatory and respiratory systems: Secondary | ICD-10-CM | POA: Insufficient documentation

## 2012-08-07 DIAGNOSIS — E785 Hyperlipidemia, unspecified: Secondary | ICD-10-CM | POA: Insufficient documentation

## 2012-08-07 NOTE — Progress Notes (Signed)
  Echocardiogram 2D Echocardiogram has been performed.  Efrat, Zuidema 08/07/2012, 2:32 PM

## 2012-08-08 ENCOUNTER — Other Ambulatory Visit: Payer: Self-pay | Admitting: *Deleted

## 2012-08-08 ENCOUNTER — Telehealth: Payer: Self-pay | Admitting: Internal Medicine

## 2012-08-08 DIAGNOSIS — I350 Nonrheumatic aortic (valve) stenosis: Secondary | ICD-10-CM

## 2012-08-08 DIAGNOSIS — I340 Nonrheumatic mitral (valve) insufficiency: Secondary | ICD-10-CM

## 2012-08-08 NOTE — Telephone Encounter (Signed)
Left message at home number and on cell number to call back.  Pt states that she has had dark stools for the past 2-3 days. Pt states she has had bleeding areas in her colon in the past that had to be taken care of by [facility]. Pt requesting sooner appt .  Pt scheduled to see Dr. Marina Goodell 08/09/12@8 :45am. Pt aware of appt date and time.

## 2012-08-09 ENCOUNTER — Telehealth: Payer: Self-pay

## 2012-08-09 ENCOUNTER — Ambulatory Visit (INDEPENDENT_AMBULATORY_CARE_PROVIDER_SITE_OTHER): Payer: Medicare Other | Admitting: Internal Medicine

## 2012-08-09 ENCOUNTER — Encounter: Payer: Self-pay | Admitting: Internal Medicine

## 2012-08-09 ENCOUNTER — Encounter (HOSPITAL_COMMUNITY): Payer: Self-pay | Admitting: *Deleted

## 2012-08-09 ENCOUNTER — Observation Stay (HOSPITAL_COMMUNITY)
Admission: AD | Admit: 2012-08-09 | Discharge: 2012-08-10 | Disposition: A | Discharge status: Home / Self Care | Payer: Medicare Other | Source: Ambulatory Visit | Attending: Internal Medicine | Admitting: Internal Medicine

## 2012-08-09 VITALS — BP 120/70 | HR 88 | Ht 61.0 in | Wt 103.4 lb

## 2012-08-09 DIAGNOSIS — K5521 Angiodysplasia of colon with hemorrhage: Principal | ICD-10-CM | POA: Insufficient documentation

## 2012-08-09 DIAGNOSIS — E7800 Pure hypercholesterolemia, unspecified: Secondary | ICD-10-CM | POA: Insufficient documentation

## 2012-08-09 DIAGNOSIS — K922 Gastrointestinal hemorrhage, unspecified: Secondary | ICD-10-CM

## 2012-08-09 DIAGNOSIS — K5520 Angiodysplasia of colon without hemorrhage: Secondary | ICD-10-CM

## 2012-08-09 DIAGNOSIS — Z79899 Other long term (current) drug therapy: Secondary | ICD-10-CM | POA: Insufficient documentation

## 2012-08-09 DIAGNOSIS — Q2733 Arteriovenous malformation of digestive system vessel: Secondary | ICD-10-CM

## 2012-08-09 DIAGNOSIS — J4489 Other specified chronic obstructive pulmonary disease: Secondary | ICD-10-CM | POA: Insufficient documentation

## 2012-08-09 DIAGNOSIS — K5730 Diverticulosis of large intestine without perforation or abscess without bleeding: Secondary | ICD-10-CM | POA: Insufficient documentation

## 2012-08-09 DIAGNOSIS — K921 Melena: Secondary | ICD-10-CM

## 2012-08-09 DIAGNOSIS — Z23 Encounter for immunization: Secondary | ICD-10-CM | POA: Insufficient documentation

## 2012-08-09 DIAGNOSIS — J449 Chronic obstructive pulmonary disease, unspecified: Secondary | ICD-10-CM | POA: Insufficient documentation

## 2012-08-09 DIAGNOSIS — Z7982 Long term (current) use of aspirin: Secondary | ICD-10-CM | POA: Insufficient documentation

## 2012-08-09 DIAGNOSIS — E785 Hyperlipidemia, unspecified: Secondary | ICD-10-CM | POA: Insufficient documentation

## 2012-08-09 DIAGNOSIS — I10 Essential (primary) hypertension: Secondary | ICD-10-CM | POA: Insufficient documentation

## 2012-08-09 DIAGNOSIS — D649 Anemia, unspecified: Secondary | ICD-10-CM | POA: Insufficient documentation

## 2012-08-09 DIAGNOSIS — K648 Other hemorrhoids: Secondary | ICD-10-CM | POA: Insufficient documentation

## 2012-08-09 LAB — HEMOGLOBIN AND HEMATOCRIT, BLOOD: HCT: 41.5 % (ref 36.0–46.0)

## 2012-08-09 LAB — CBC
MCHC: 34.8 g/dL (ref 30.0–36.0)
RDW: 14.1 % (ref 11.5–15.5)

## 2012-08-09 LAB — COMPREHENSIVE METABOLIC PANEL
ALT: 17 U/L (ref 0–35)
Albumin: 4.2 g/dL (ref 3.5–5.2)
Alkaline Phosphatase: 72 U/L (ref 39–117)
Potassium: 4.7 mEq/L (ref 3.5–5.1)
Sodium: 142 mEq/L (ref 135–145)
Total Protein: 7.5 g/dL (ref 6.0–8.3)

## 2012-08-09 LAB — PROTIME-INR: Prothrombin Time: 11.9 seconds (ref 11.6–15.2)

## 2012-08-09 LAB — PREPARE RBC (CROSSMATCH)

## 2012-08-09 MED ORDER — INFLUENZA VIRUS VACC SPLIT PF IM SUSP
0.5000 mL | INTRAMUSCULAR | Status: AC
  Administered 2012-08-10: 0.5 mL via INTRAMUSCULAR
  Filled 2012-08-09: qty 0.5

## 2012-08-09 MED ORDER — PEG-KCL-NACL-NASULF-NA ASC-C 100 G PO SOLR
1.0000 | Freq: Once | ORAL | Status: AC
  Administered 2012-08-09: 100 g via ORAL
  Filled 2012-08-09: qty 1

## 2012-08-09 MED ORDER — KCL IN DEXTROSE-NACL 10-5-0.45 MEQ/L-%-% IV SOLN
INTRAVENOUS | Status: DC
  Administered 2012-08-09: 14:00:00 via INTRAVENOUS
  Administered 2012-08-10: 1000 mL via INTRAVENOUS
  Filled 2012-08-09 (×4): qty 1000

## 2012-08-09 MED ORDER — SIMVASTATIN 10 MG PO TABS
10.0000 mg | ORAL_TABLET | Freq: Every day | ORAL | Status: DC
  Administered 2012-08-09: 10 mg via ORAL
  Filled 2012-08-09 (×2): qty 1

## 2012-08-09 MED ORDER — HYDROMORPHONE HCL PF 1 MG/ML IJ SOLN: 0.5000 mg | INTRAMUSCULAR | Status: DC | PRN

## 2012-08-09 MED ORDER — ONDANSETRON HCL 4 MG/2ML IJ SOLN: 4.0000 mg | Freq: Four times a day (QID) | INTRAMUSCULAR | Status: DC | PRN

## 2012-08-09 MED ORDER — PANTOPRAZOLE SODIUM 40 MG PO TBEC
40.0000 mg | DELAYED_RELEASE_TABLET | Freq: Every day | ORAL | Status: DC
  Filled 2012-08-09: qty 1

## 2012-08-09 MED ORDER — ONDANSETRON HCL 4 MG PO TABS: 4.0000 mg | ORAL_TABLET | Freq: Four times a day (QID) | ORAL | Status: DC | PRN

## 2012-08-09 MED ORDER — ACETAMINOPHEN 650 MG RE SUPP: 650.0000 mg | Freq: Four times a day (QID) | RECTAL | Status: DC | PRN

## 2012-08-09 MED ORDER — ACETAMINOPHEN 325 MG PO TABS: 650.0000 mg | ORAL_TABLET | Freq: Four times a day (QID) | ORAL | Status: DC | PRN

## 2012-08-09 MED ORDER — HYDROCODONE-ACETAMINOPHEN 5-325 MG PO TABS: 1.0000 | ORAL_TABLET | ORAL | Status: DC | PRN

## 2012-08-09 NOTE — Patient Instructions (Addendum)
You have been given a separate informational sheet regarding your tobacco use, the importance of quitting and local resources to help you quit.  You will be hearing from the admitting department for [facility] later today to let you know they have a bed for you.

## 2012-08-09 NOTE — H&P (Signed)
Primary Care Physician:  FUSCO,LAWRENCE J., MD Primary Gastroenterologist:  Dr. John Perry  CHIEF COMPLAINT:  Gi bleeding  HPI: Kathleen Pope is a 78 y.o. female , known to Dr. Perry, but not seen over the past several years. She has history of hypertension, hyperlipidemia, COPD, and recurrent GI bleeding felt secondary to colonic AVMs . Patient has remote history of GI bleed dating back 10-15 years, and at that time her bleeding was felt to be diverticular, and she underwent a sigmoid colectomy. Unfortunately her bleeding recurred thereafter and she was transferred to our institution. She had colonoscopy which showed bleeding AVMs in the cecum. She underwent endoscopic treatment with cauterization and did well thereafter without any problems. She has been maintained on chronic iron therapy. She had colonoscopy in 2006 for asymptomatic Hemoccult positive stool without anemia ,at that time  no significant abnormalities were noted. In August of 2008 she had of recurrent hematochezia and underwent colonoscopy while she was hospitalized. At that time she was found to have 2 cecal AVMs which were treated endoscopically.  Last week patient had a routine physical and was feeling well however about 3 days ago she started having dark stools that she states were "leaching out red blood" i, also noted occasional red streaks within the dark stool. Patient feels that this is typical for her bleeding. She has no complaints of shortness of breath, dizziness, chest pain or abdominal pain. Her GI review of systems was otherwise negative . He does take a baby ASA  everyday She was seen in the office this morning by Dr. Perry and decision was made to admit her for recurrent lower GI bleeding probably secondary to colonic AVM's.    Past Medical History:   Chronic airway obstruction, not elsewhere clas*              Lower GI bleed                                  07/26/2000    HTN (hypertension)                                            Hypercholesteremia                                           Anemia                                                       COPD (chronic obstructive pulmonary disease)                Past Surgical History:   COLECTOMY                                                    TONSILLECTOMY                                                  BUNIONECTOMY                                                   Comment:left  Prior to Admission medications : Medication amLODipine (NORVASC) 5 MG tablet, Sig Take 5 mg by mouth daily., Start Date , End Date , Taking? Yes, Authorizing Provider Historical Provider, MD  Medication aspirin 81 MG tablet, Sig Take 81 mg by mouth daily., Start Date , End Date , Taking? Yes, Authorizing Provider Historical Provider, MD  Medication calcium carbonate (OS-CAL) 600 MG TABS, Sig Take 600 mg by mouth daily., Start Date , End Date , Taking? Yes, Authorizing Provider Historical Provider, MD  Medication ferrous sulfate 325 (65 FE) MG EC tablet, Sig Take 325 mg by mouth 3 (three) times daily with meals., Start Date , End Date , Taking? Yes, Authorizing Provider Historical Provider, MD  Medication fish oil-omega-3 fatty acids 1000 MG capsule, Sig Take 1 g by mouth daily., Start Date , End Date , Taking? Yes, Authorizing Provider Historical Provider, MD  Medication Multiple Vitamin (MULTIVITAMIN) tablet, Sig Take 1 tablet by mouth daily., Start Date , End Date , Taking? Yes, Authorizing Provider Historical Provider, MD  Medication pravastatin (PRAVACHOL) 10 MG tablet, Sig Take 10 mg by mouth daily., Start Date , End Date , Taking? Yes, Authorizing Provider Historical Provider, MD    Current Outpatient Prescriptions: amLODipine (NORVASC) 5 MG tablet, Take 5 mg by mouth daily., Disp: , Rfl:  aspirin 81 MG tablet, Take 81 mg by mouth daily., Disp: , Rfl:  calcium carbonate (OS-CAL) 600 MG TABS, Take 600 mg by mouth daily., Disp: , Rfl:  ferrous sulfate 325 (65 FE) MG EC  tablet, Take 325 mg by mouth 3 (three) times daily with meals., Disp: , Rfl:  fish oil-omega-3 fatty acids 1000 MG capsule, Take 1 g by mouth daily., Disp: , Rfl:  Multiple Vitamin (MULTIVITAMIN) tablet, Take 1 tablet by mouth daily., Disp: , Rfl:  pravastatin (PRAVACHOL) 10 MG tablet, Take 10 mg by mouth daily., Disp: , Rfl:     Allergies as of 08/09/2012 (No Known Allergies)  - Review Complete 08/09/2012   Review of patient's family history indicates:   Breast cancer                  Mother                   Lung cancer                    Father                   Breast cancer                  Sister                   Anuerysm                       Brother                    Comment: in stomach   Social History   Marital Status: Widowed             Spouse Name:                        Years of Education:                 Number of children: 1           Occupational History Occupation          Employer            Comment              retired                                   Social History Main Topics   Smoking Status: Current Every Day Smoker        Packs/Day: .8    Years: 59        Types: Cigarettes   Smokeless Status: Never Used                       Alcohol Use: No             Drug Use: No             Sexual Activity: Not on file        Other Topics            Concern   None on file  Social History Narrative   None on file    Review of Systems: Pertinent positive and negative review of systems were noted in the above HPI section.  All other review of systems was otherwise negative.  Physical Exam: Vital signs in last 24 hours: Blood pressure 120/70 pulse 88 height 5 foot 1 weight 103   General:   Alert,  well-developed, cooperative female in NAD Head:  Normocephalic and atraumatic. Eyes:  Sclera clear, no icterus.   Conjunctiva pink. Ears:  Normal auditory acuity. Mouth:  No deformity or lesions.  Oropharynx pink & moist. Neck:  Supple; no masses or  thyromegaly. Lungs:  Clear throughout to auscultation.   No wheezes, crackles, or rhonchi. No acute distress. Heart:  Regular rate and rhythm; no murmurs. Abdomen:  Soft, nontender and nondistended. No masses, hepatomegaly. No obvious masses.  Normal bowel sounds.    Rectal:  Per Dr. Perry  dark and. cherry-red streaked stool grossly Hemoccult-positive Msk:  Symmetrical without gross deformities. Normal posture. Pulses:  Normal pulses noted. Extremities:  Without edema. Neurologic:  Alert and  oriented x4;  grossly normal neurologically. Skin:  Intact without significant lesions or rashes.  Psych:  Alert and cooperative. Normal mood and affect.  Lab Results: Pending  Studies/Results: Us Carotid Duplex Bilateral  08/07/2012  *RADIOLOGY REPORT*  Clinical Data: Carotid bruit  BILATERAL CAROTID DUPLEX ULTRASOUND  Technique: Gray scale imaging, color Doppler and duplex ultrasound was performed of bilateral carotid and vertebral arteries in the neck.  Comparison:  None.  Criteria:  Quantification of carotid stenosis is based on velocity parameters that correlate the residual internal carotid diameter with NASCET-based stenosis levels, using the diameter of the distal internal carotid lumen as the denominator for stenosis measurement.  The following velocity measurements were obtained:                   PEAK SYSTOLIC/END DIASTOLIC RIGHT ICA:                        76/20cm/sec CCA:                          66/13cm/sec SYSTOLIC ICA/CCA RATIO:     1.15 DIASTOLIC ICA/CCA RATIO:    1.57 ECA:                        87cm/sec  LEFT ICA:                        76/25cm/sec CCA:                        73/11cm/sec SYSTOLIC ICA/CCA RATIO:     1.04 DIASTOLIC ICA/CCA RATIO:    2.22 ECA:                        78cm/sec  Findings:  RIGHT CAROTID ARTERY: Some calcific plaque is noted in the region of the carotid bulb extending into the proximal internal carotid artery.  The waveforms, velocities and flow velocity ratios  however demonstrate no evidence of focal hemodynamically significant stenosis.  RIGHT VERTEBRAL ARTERY:  Antegrade in nature  LEFT CAROTID ARTERY: Some calcific plaque is noted in the region of the carotid bulb extending into the proximal internal carotid artery.  Evaluation of the waveforms, velocities and flow velocity ratios however demonstrate no evidence of focal hemodynamically significant stenosis.  LEFT VERTEBRAL ARTERY:  Antegrade in nature  IMPRESSION: Atherosclerotic plaque bilaterally without focal hemodynamically significant stenosis.   Original Report Authenticated By: MARK L. LUKENS, M.D.     Impression / Plan:  #1  79-year-old female with recurrent lower GI bleeding, probably secondary to known colonic AVMs. Patient stable hemodynamically at the time of admission. #2  previous history of lower GI bleeding secondary to colonic AVMs requiring endoscopic therapy on 2 previous occasions #3 diverticulosis-that is post remote sigmoid colectomy #4 hypertension #5 COPD #6 hyperlipidemia  Plan; patient is admitted to the [facility] service for supportive management Serial hemoglobins and transfusions as indicated She will  need repeat colonoscopy and endoscopic therapy if indicated this admission For details please see the orders.     [email or web address]  Gregroy Dombkowski  08/09/2012, 11:10 AM    

## 2012-08-09 NOTE — Telephone Encounter (Signed)
Spoke with Shaun in Patient Placement and gave him all applicable information.  They are going to call pt as soon as they are sure they have a bed at [facility].

## 2012-08-09 NOTE — Progress Notes (Signed)
HISTORY OF PRESENT ILLNESS:  Kathleen Pope is a 78 y.o. female with hypertension, hyperlipidemia, COPD, and recurrent GI bleeding secondary to colonic AVMs. The patient's history dates back 10-15 years ago when she developed significant recurrent GI bleeding that was initially evaluated elsewhere. The cause was felt to be diverticular in she underwent sigmoid colectomy. Unfortunately, bleeding recurred and she was transferred to our institution. Colonoscopy was performed and revealed bleeding AV malformations of the cecum. These were treated endoscopically with cauterization. She did quite well thereafter without problems. She has been maintained on chronic iron therapy. She did undergo colonoscopy in 2006 for asymptomatic Hemoccult positive stool without anemia. No significant abnormalities identified. Again, she continued on iron. However in August of 2008, she had moderate hematochezia for which she underwent repeat colonoscopy during a three-day hospitalization. At that time, she was found to have 2 cecal AVMs which were treated endoscopically. Last week she underwent her routine annual physical exam with no problems found. However, about 3 days ago she began to notice dark stools that were leaching out red blood. As well occasional red streaks within the dark stool. This was typical for her bleeding. No associated symptoms such as shortness of breath, abdominal pain, or chest pain. She denies dizziness. GI review of systems are otherwise negative.  REVIEW OF SYSTEMS:  All non-GI ROS negative except for smoker's cough  Past Medical History  Diagnosis Date  . Chronic airway obstruction, not elsewhere classified   . Lower GI bleed 07/26/2000  . HTN (hypertension)   . Hypercholesteremia   . Anemia   . COPD (chronic obstructive pulmonary disease)     Past Surgical History  Procedure Date  . Colectomy   . Tonsillectomy   . Bunionectomy     left    Social History TRISH MANCINELLI  reports that she  has been smoking Cigarettes.  She has a 47.2 pack-year smoking history. She has never used smokeless tobacco. She reports that she does not drink alcohol or use illicit drugs.  family history includes Anuerysm in her brother; Breast cancer in her mother and sister; and Lung cancer in her father.  No Known Allergies     PHYSICAL EXAMINATION: Vital signs: BP 120/70  Pulse 88  Ht 5\' 1"  (1.549 m)  Wt 103 lb 6 oz (46.891 kg)  BMI 19.53 kg/m2 General: Well-developed, well-nourished, no acute distress HEENT: Sclerae are anicteric, conjunctiva pink. Oral mucosa intact Lungs: Clear Heart: Regular Abdomen: soft, nontender, nondistended, no obvious ascites, no peritoneal signs, normal bowel sounds. No organomegaly. Rectal: Dark and cherry-red Street Hemoccult-positive stool Extremities: No edema Psychiatric: alert and oriented x3. Cooperative    ASSESSMENT:  #1. Hemodynamically stable low-grade persisting lower GI bleed. Presumably due to AVMs. Last colonoscopy 2007 #2. General medical problems   PLAN:  I think it would be best to admit the patient to the hospital as AVMs generally continue to bleed and less treated. Certainly this is her history. We prep her bowel for colonoscopy with possible therapeutics. I have discussed this with our hospital team GI physician and physician assistant.

## 2012-08-09 NOTE — Plan of Care (Signed)
Problem: Altered GI Function ([place]-1.4) Goal: Adequate hydration Outcome: Progressing Clear liquids

## 2012-08-10 ENCOUNTER — Encounter (HOSPITAL_COMMUNITY)
Admission: AD | Disposition: A | Discharge status: Home / Self Care | Payer: Self-pay | Source: Ambulatory Visit | Attending: Internal Medicine

## 2012-08-10 ENCOUNTER — Encounter (HOSPITAL_COMMUNITY): Payer: Self-pay | Admitting: Gastroenterology

## 2012-08-10 HISTORY — PX: COLONOSCOPY: SHX5424

## 2012-08-10 LAB — HEMOGLOBIN AND HEMATOCRIT, BLOOD: Hemoglobin: 12.9 g/dL (ref 12.0–15.0)

## 2012-08-10 SURGERY — COLONOSCOPY
Anesthesia: Moderate Sedation

## 2012-08-10 MED ORDER — FENTANYL CITRATE 0.05 MG/ML IJ SOLN
INTRAMUSCULAR | Status: DC | PRN
  Administered 2012-08-10: 12.5 ug via INTRAVENOUS
  Administered 2012-08-10: 25 ug via INTRAVENOUS
  Administered 2012-08-10 (×2): 12.5 ug via INTRAVENOUS

## 2012-08-10 MED ORDER — FENTANYL CITRATE 0.05 MG/ML IJ SOLN
INTRAMUSCULAR | Status: AC
  Filled 2012-08-10: qty 2

## 2012-08-10 MED ORDER — INFLUENZA VIRUS VACC SPLIT PF IM SUSP: 0.5000 mL | INTRAMUSCULAR | Status: DC

## 2012-08-10 MED ORDER — BIOTENE DRY MOUTH MT LIQD: 15.0000 mL | Freq: Two times a day (BID) | OROMUCOSAL | Status: DC

## 2012-08-10 MED ORDER — CHLORHEXIDINE GLUCONATE 0.12 % MT SOLN
15.0000 mL | Freq: Two times a day (BID) | OROMUCOSAL | Status: DC
  Filled 2012-08-10 (×3): qty 15

## 2012-08-10 MED ORDER — MIDAZOLAM HCL 5 MG/5ML IJ SOLN
INTRAMUSCULAR | Status: DC | PRN
  Administered 2012-08-10 (×2): 1 mg via INTRAVENOUS
  Administered 2012-08-10: 2 mg via INTRAVENOUS
  Administered 2012-08-10: 1 mg via INTRAVENOUS

## 2012-08-10 MED ORDER — MIDAZOLAM HCL 10 MG/2ML IJ SOLN
INTRAMUSCULAR | Status: AC
  Filled 2012-08-10: qty 2

## 2012-08-10 NOTE — Discharge Summary (Signed)
[facility] Discharge Summary  Name: Kathleen Pope MRN: [id number] DOB: 07/14/1933 78 y.o. 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 Patient currently stable status post colonoscopy and endoscopic ablation of cecal AVMs 08/10/2012 #2 mild anemia secondary to above  Consultations: None  Procedures Performed:  Dg Wrist Complete Left  08/01/2012  *RADIOLOGY REPORT*  Clinical Data: Left wrist pain for 2 months, no known injury  LEFT WRIST - COMPLETE 3+ VIEW  Comparison: None  Findings: Osseous demineralization. Joint spaces preserved. No acute fracture, dislocation, or bone destruction.  IMPRESSION: Osseous demineralization. No acute abnormalities.   Original Report Authenticated By: Lollie Marrow, M.D.    US Carotid Duplex Bilateral  08/07/2012  *RADIOLOGY REPORT*  Clinical Data: Carotid bruit  BILATERAL CAROTID DUPLEX ULTRASOUND  Technique: Wallace Cullens scale imaging, color Doppler and duplex ultrasound was performed of bilateral carotid and vertebral arteries in the neck.  Comparison:  None.  Criteria:  Quantification of carotid stenosis is based on velocity parameters that correlate the residual internal carotid diameter with NASCET-based stenosis levels, using the diameter of the distal internal carotid lumen as the denominator for stenosis measurement.  The following velocity measurements were obtained:                   PEAK SYSTOLIC/END DIASTOLIC RIGHT ICA:                        76/20cm/sec CCA:                        66/13cm/sec SYSTOLIC ICA/CCA RATIO:     1.15 DIASTOLIC ICA/CCA RATIO:    1.57 ECA:                        87cm/sec  LEFT ICA:                        76/25cm/sec CCA:                        73/11cm/sec SYSTOLIC ICA/CCA RATIO:     1.04 DIASTOLIC ICA/CCA RATIO:    2.22 ECA:                         78cm/sec  Findings:  RIGHT CAROTID ARTERY: Some calcific plaque is noted in the region of the carotid bulb extending into the proximal internal carotid artery.  The waveforms, velocities and flow velocity ratios however demonstrate no evidence of focal hemodynamically significant stenosis.  RIGHT VERTEBRAL ARTERY:  Antegrade in nature  LEFT CAROTID ARTERY: Some calcific plaque is noted in the region of the carotid bulb extending into the proximal internal carotid artery.  Evaluation of the waveforms, velocities and flow velocity ratios however demonstrate no evidence of focal hemodynamically significant stenosis.  LEFT VERTEBRAL ARTERY:  Antegrade in nature  IMPRESSION: Atherosclerotic plaque bilaterally without focal hemodynamically significant stenosis.   Original Report Authenticated By: Phillips Odor, M.D.     GI Procedures: Colonoscopy per Dr. Russella Dar 08/10/2012  History/Physical Exam:  See Admission H&P  Admission HPI: Kathleen Pope is a very nice 78 year old white female known to Dr. Marina Goodell who has prior history of lower GI bleeding secondary to  cecal AVMs and had had endoscopic ablation twice in the past. Her last colonoscopy I believe was in 2008. Patient presented to the office yesterday with several day history of black stools very similar to her prior episodes. She had been taking a baby aspirin daily. She had no other GI symptoms. She was admitted with recurrent lower GI bleeding.  Hospital Course by problem list: 28 was admitted on 08/09/2012 with recurrent lower GI bleeding. She was hemodynamically stable and was in the normal range at 14.8 She was scheduled for colonoscopy on the morning of 08/10/2012 and underwent bowel prep the evening prior. Caps tolerated the prep without difficulty was still passing small amounts of dark red blood. On the morning of 9/26 her hemoglobin was 12.9 and she felt fine. Colonoscopy was done per Dr. Russella Dar and she was found to have at least 2 cecal AVMs one  of which was actively oozing in the AVMs were cauterized with good hemostasis. Patient is allowed discharged home this afternoon. She will remain off of all aspirin and NSAIDs for the next 2 weeks. As her hemoglobin is still in the normal range I do not think she needs followup CBC. She will followup with Dr. Wendie Agreste on an as-needed basis for any evidence of recurrent bleeding  Discharge Vitals:  BP 122/68  Pulse 98  Temp 98.4 F (36.9 C) (Oral)  Resp 11  Ht 5\' 1"  (1.549 m)  Wt 103 lb 12.8 oz (47.083 kg)  BMI 19.61 kg/m2  SpO2 97%  Discharge Labs:  Results for orders placed during the hospital encounter of 08/09/12 (from the past 24 hour(s))  PREPARE RBC (CROSSMATCH)     Status: Normal   Collection Time   08/09/12  1:30 PM      Component Value Range   Order Confirmation ORDER PROCESSED BY BLOOD BANK    TYPE AND SCREEN     Status: Normal (Preliminary result)   Collection Time   08/09/12  2:00 PM      Component Value Range   ABO/RH(D) B POS     Antibody Screen NEG     Sample Expiration 08/12/2012     Unit Number [id number]     Blood Component Type RED CELLS,LR     Unit division 00     Status of Unit ALLOCATED     Transfusion Status OK TO TRANSFUSE     Crossmatch Result Compatible     Unit Number [id number]     Blood Component Type RED CELLS,LR     Unit division 00     Status of Unit ALLOCATED     Transfusion Status OK TO TRANSFUSE     Crossmatch Result Compatible    CBC     Status: Normal   Collection Time   08/09/12  2:00 PM      Component Value Range   WBC 9.6  4.0 - 10.5 K/uL   RBC 4.39  3.87 - 5.11 MIL/uL   Hemoglobin 14.3  12.0 - 15.0 g/dL   HCT 30.8  65.7 - 84.6 %   MCV 93.6  78.0 - 100.0 fL   MCH 32.6  26.0 - 34.0 pg   MCHC 34.8  30.0 - 36.0 g/dL   RDW 96.2  95.2 - 84.1 %   Platelets 313  150 - 400 K/uL  COMPREHENSIVE METABOLIC PANEL     Status: Abnormal   Collection Time   08/09/12  2:00 PM      Component Value Range   Sodium  142  135 - 145 mEq/L    Potassium 4.7  3.5 - 5.1 mEq/L   Chloride 103  96 - 112 mEq/L   CO2 30  19 - 32 mEq/L   Glucose, Bld 95  70 - 99 mg/dL   BUN 13  6 - 23 mg/dL   Creatinine, Ser 5.62  0.50 - 1.10 mg/dL   Calcium 13.0  8.4 - 86.5 mg/dL   Total Protein 7.5  6.0 - 8.3 g/dL   Albumin 4.2  3.5 - 5.2 g/dL   AST 23  0 - 37 U/L   ALT 17  0 - 35 U/L   Alkaline Phosphatase 72  39 - 117 U/L   Total Bilirubin 0.4  0.3 - 1.2 mg/dL   GFR calc non Af Amer 81 (*) >90 mL/min   GFR calc Af Amer >90  >90 mL/min  PROTIME-INR     Status: Normal   Collection Time   08/09/12  2:00 PM      Component Value Range   Prothrombin Time 11.9  11.6 - 15.2 seconds   INR 0.88  0.00 - 1.49  HEMOGLOBIN AND HEMATOCRIT, BLOOD     Status: Normal   Collection Time   08/09/12  8:57 PM      Component Value Range   Hemoglobin 14.2  12.0 - 15.0 g/dL   HCT 78.4  69.6 - 29.5 %  HEMOGLOBIN AND HEMATOCRIT, BLOOD     Status: Normal   Collection Time   08/10/12  5:15 AM      Component Value Range   Hemoglobin 12.9  12.0 - 15.0 g/dL   HCT 28.4  13.2 - 44.0 %    Disposition and follow-up:   Ms.Iliyana S Rankin was discharged from [facility] in stable condition.    Follow-up Appointments: Follow up with Dr. Marina Goodell  As needed for recurrent problems with bleeding   Discharge Medications:   Medication List     As of 08/10/2012  1:13 PM    STOP taking these medications         aspirin 81 MG tablet      TAKE these medications         acetaminophen 500 MG tablet   Commonly known as: TYLENOL   Take 1,000 mg by mouth every 6 (six) hours as needed. Pain      amLODipine 5 MG tablet   Commonly known as: NORVASC   Take 5 mg by mouth daily.      calcium carbonate 600 MG Tabs   Commonly known as: OS-CAL   Take 600 mg by mouth daily.      ferrous sulfate 325 (65 FE) MG EC tablet   Take 325 mg by mouth 3 (three) times daily with meals.      fish oil-omega-3 fatty acids 1000 MG capsule   Take 1 g by mouth daily.      multivitamin  tablet   Take 1 tablet by mouth daily.      pravastatin 10 MG tablet   Commonly known as: PRAVACHOL   Take 10 mg by mouth daily.        Signed: Mike Gip 08/10/2012, 1:13 PM

## 2012-08-10 NOTE — Op Note (Signed)
[facility] [address] [place], [address]   COLONOSCOPY PROCEDURE REPORT PATIENT: Kathleen Pope, Kathleen Pope  MR#: [id number] BIRTHDATE: May 25, 1933 , 79  yrs. old GENDER: Female ENDOSCOPIST: Meryl Dare, MD, [facility]  PROCEDURE DATE:  08/10/2012 PROCEDURE:   Colonoscopy with control of bleeding ASA CLASS:   Class III INDICATIONS: hematochezia and heme-positive stool. MEDICATIONS: These medications were titrated to patient response per physician's verbal order and Fentanyl 62.5 mcg IV and Versed 5 mg IV DESCRIPTION OF PROCEDURE:   After the risks benefits and alternatives of the procedure were thoroughly explained, informed consent was obtained.  A digital rectal exam revealed no abnormalities of the rectum.   The Colonoscope [id number]-[id number] endoscope was introduced through the anus and advanced to the cecum, which was identified by both the appendix and ileocecal valve. No adverse events experienced.   The quality of the prep was Moviprep fair  The instrument was then slowly withdrawn as the colon was fully examined.   COLON FINDINGS: 2 small, 3 mm, angiodysplastic lesions with active hemorrhaging was found at the cecum.  Thermal therapy was used to control bleeding. Hemostasis was achieved. 1 medium sized, 6 mm, angiodysplastic lesion was found at the cecum.  Thermal therapy was used to ablate the AVM.   There was evidence of a prior colo-colonic surgical anastomosis in the sigmoid colon.  The colon was otherwise normal. There was no diverticulosis, inflammation, polyps or cancers unless previously stated.  Retroflexed views revealed moderate internal hemorrhoids. The time to cecum=3 minutes 00 seconds.  Withdrawal time=12 minutes 00 seconds.  The scope was withdrawn and the procedure completed. COMPLICATIONS: There were no complications.  ENDOSCOPIC IMPRESSION: 1.   2 angiodysplastic lesions with hemorrhaging at the cecum 2.   Medium sized angiodysplastic  lesion without hemorrhaging at the cecum 3.   There was evidence of a prior colo-colonic surgical anastomosis in the sigmoid colon 4.   Moderate sized internal hemorrhoids   RECOMMENDATIONS: 1.  Hold aspirin, aspirin products, and anti-inflammatory medication for at least 2 weeks. 2.  Call to schedule a follow-up appointment with Dr. Marina Goodell in 3-4 weeks 3.  If she has persistent or recurrent bleeding the colonoscopy may need to be repeated as the bowel prep was only fair  eSigned:  Meryl Dare, MD, [facility] 08/10/2012 11:31 AM   cc: Yancey Flemings, MD and Elfredia Nevins, MD   PATIENT NAME:  [name], [name] MR#: [id number]

## 2012-08-10 NOTE — Discharge Summary (Signed)
I have taken an interval history, reviewed the chart and examined the patient. I agree with the extender's note, impression and recommendations.   Eshan Trupiano T. Russell Quinney MD FACG 

## 2012-08-10 NOTE — Interval H&P Note (Signed)
History and Physical Interval Note:  08/10/2012 10:52 AM  Kathleen Pope  has presented today for surgery, with the diagnosis of gi bleed -colonic AVM's  The various methods of treatment have been discussed with the patient and family. After consideration of risks, benefits and other options for treatment, the patient has consented to  Procedure(s) (LRB) with comments: COLONOSCOPY (N/A) as a surgical intervention .  The patient's history has been reviewed, patient examined, no change in status, stable for surgery.  I have reviewed the patient's chart and labs.  Questions were answered to the patient's satisfaction.     Venita Lick. Russella Dar MD Clementeen Graham

## 2012-08-10 NOTE — Progress Notes (Signed)
Patient ID: Kathleen Pope, female   DOB: 10/15/1933, 78 y.o.   MRN: [id number]  [facility] Progress Note  Subjective: Tolerated prep without difficulty- stool liquid but has some dark red blood in it. HGB 14 down to 12.9 this am.  Objective:  Vital signs in last 24 hours: Temp:  [97.8 F (36.6 C)-98.4 F (36.9 C)] 98.4 F (36.9 C) (09/26 0605) Pulse Rate:  [72-82] 82  (09/26 0605) Resp:  [16-17] 17  (09/26 0605) BP: (108-146)/(66-81) 108/66 mmHg (09/26 0605) SpO2:  [99 %] 99 % (09/26 0605) Weight:  [103 lb 12.8 oz (47.083 kg)] 103 lb 12.8 oz (47.083 kg) (09/25 1446) Last BM Date: 08/09/12 General:   Alert,  Well-developed,    in NAD Heart:  Regular rate and rhythm; no murmurs Pulm;clear Abdomen:  Soft, nontender and nondistended. Normal bowel sounds, without guarding, and without rebound.   Extremities:  Without edema. Neurologic:  Alert and  oriented x4;  grossly normal neurologically. Psych:  Alert and cooperative. Normal mood and affect.  Intake/Output from previous day: 09/25 0701 - 09/26 0700 In: 4087.6 [P.O.:2880; I.V.:1207.6] Out: 2650 [Urine:2650] Intake/Output this shift: Total I/O In: 1374 [P.O.:360; I.V.:1014] Out: -   Lab Results:  Basename 08/10/12 0515 08/09/12 2057 08/09/12 1400  WBC -- -- 9.6  HGB 12.9 14.2 14.3  HCT 37.6 41.5 41.1  PLT -- -- 313   BMET  Basename 08/09/12 1400  NA 142  K 4.7  CL 103  CO2 30  GLUCOSE 95  BUN 13  CREATININE 0.70  CALCIUM 10.1   LFT  Basename 08/09/12 1400  PROT 7.5  ALBUMIN 4.2  AST 23  ALT 17  ALKPHOS 72  BILITOT 0.4  BILIDIR --  IBILI --   PT/INR  Basename 08/09/12 1400  LABPROT 11.9  INR 0.88     Assessment / Plan: #1 78 yo female with hx of GI bleeding secondary to colonic AVM's- now with recurrent bleeding.Not anemic but  hgb has dropped. For Colonoscopy this am- await findings- if AVM's found and treated, can probably be discharged home later today- if colon negative will  need EGD.   Active Problems:  * No active hospital problems. *     LOS: 1 day   Amy Esterwood  08/10/2012, 8:47 AM   I have taken an interval history, reviewed the chart and examined the patient. I agree with the extender's note, impression and recommendations. Presumed bleeding from known cecal AVMs. For colonoscopy today. Mild Hb drop from 14.3 to 12.9.  Venita Lick. Russella Dar MD Clementeen Graham

## 2012-08-10 NOTE — H&P (View-Only) (Signed)
Primary Care Physician:  Cassell Smiles., MD Primary Gastroenterologist:  Dr. Yancey Flemings  CHIEF COMPLAINT:  Gi bleeding  HPI: Kathleen Pope is a 78 y.o. female , known to Dr. Marina Goodell, but not seen over the past several years. She has history of hypertension, hyperlipidemia, COPD, and recurrent GI bleeding felt secondary to colonic AVMs . Patient has remote history of GI bleed dating back 10-15 years, and at that time her bleeding was felt to be diverticular, and she underwent a sigmoid colectomy. Unfortunately her bleeding recurred thereafter and she was transferred to our institution. She had colonoscopy which showed bleeding AVMs in the cecum. She underwent endoscopic treatment with cauterization and did well thereafter without any problems. She has been maintained on chronic iron therapy. She had colonoscopy in 2006 for asymptomatic Hemoccult positive stool without anemia ,at that time  no significant abnormalities were noted. In August of 2008 she had of recurrent hematochezia and underwent colonoscopy while she was hospitalized. At that time she was found to have 2 cecal AVMs which were treated endoscopically.  Last week patient had a routine physical and was feeling well however about 3 days ago she started having dark stools that she states were "leaching out red blood" i, also noted occasional red streaks within the dark stool. Patient feels that this is typical for her bleeding. She has no complaints of shortness of breath, dizziness, chest pain or abdominal pain. Her GI review of systems was otherwise negative . He does take a baby ASA  everyday She was seen in the office this morning by Dr. Marina Goodell and decision was made to admit her for recurrent lower GI bleeding probably secondary to colonic AVM's.    Past Medical History:   Chronic airway obstruction, not elsewhere clas*              Lower GI bleed                                  07/26/2000    HTN (hypertension)                                            Hypercholesteremia                                           Anemia                                                       COPD (chronic obstructive pulmonary disease)                Past Surgical History:   COLECTOMY                                                    TONSILLECTOMY  BUNIONECTOMY                                                   Comment:left  Prior to Admission medications : Medication amLODipine (NORVASC) 5 MG tablet, Sig Take 5 mg by mouth daily., Start Date , End Date , Taking? Yes, Authorizing Provider Historical Provider, MD  Medication aspirin 81 MG tablet, Sig Take 81 mg by mouth daily., Start Date , End Date , Taking? Yes, Authorizing Provider Historical Provider, MD  Medication calcium carbonate (OS-CAL) 600 MG TABS, Sig Take 600 mg by mouth daily., Start Date , End Date , Taking? Yes, Authorizing Provider Historical Provider, MD  Medication ferrous sulfate 325 (65 FE) MG EC tablet, Sig Take 325 mg by mouth 3 (three) times daily with meals., Start Date , End Date , Taking? Yes, Authorizing Provider Historical Provider, MD  Medication fish oil-omega-3 fatty acids 1000 MG capsule, Sig Take 1 g by mouth daily., Start Date , End Date , Taking? Yes, Authorizing Provider Historical Provider, MD  Medication Multiple Vitamin (MULTIVITAMIN) tablet, Sig Take 1 tablet by mouth daily., Start Date , End Date , Taking? Yes, Authorizing Provider Historical Provider, MD  Medication pravastatin (PRAVACHOL) 10 MG tablet, Sig Take 10 mg by mouth daily., Start Date , End Date , Taking? Yes, Authorizing Provider Historical Provider, MD    Current Outpatient Prescriptions: amLODipine (NORVASC) 5 MG tablet, Take 5 mg by mouth daily., Disp: , Rfl:  aspirin 81 MG tablet, Take 81 mg by mouth daily., Disp: , Rfl:  calcium carbonate (OS-CAL) 600 MG TABS, Take 600 mg by mouth daily., Disp: , Rfl:  ferrous sulfate 325 (65 FE) MG EC  tablet, Take 325 mg by mouth 3 (three) times daily with meals., Disp: , Rfl:  fish oil-omega-3 fatty acids 1000 MG capsule, Take 1 g by mouth daily., Disp: , Rfl:  Multiple Vitamin (MULTIVITAMIN) tablet, Take 1 tablet by mouth daily., Disp: , Rfl:  pravastatin (PRAVACHOL) 10 MG tablet, Take 10 mg by mouth daily., Disp: , Rfl:     Allergies as of 08/09/2012 (No Known Allergies)  - Review Complete 08/09/2012   Review of patient's family history indicates:   Breast cancer                  Mother                   Lung cancer                    Father                   Breast cancer                  Sister                   Anuerysm                       Brother                    Comment: in stomach   Social History   Marital Status: Widowed             Spouse Name:  Years of Education:                 Number of children: 1           Occupational History Occupation          Associate Professor            Comment              retired                                   Chief Executive Officer History Main Topics   Smoking Status: Current Every Day Smoker        Packs/Day: .8    Years: 78        Types: Cigarettes   Smokeless Status: Never Used                       Alcohol Use: No             Drug Use: No             Sexual Activity: Not on file        Other Topics            Concern   None on file  Social History Narrative   None on file    Review of Systems: Pertinent positive and negative review of systems were noted in the above HPI section.  All other review of systems was otherwise negative.  Physical Exam: Vital signs in last 24 hours: Blood pressure 120/70 pulse 88 height 5 foot 1 weight 103   General:   Alert,  well-developed, cooperative female in NAD Head:  Normocephalic and atraumatic. Eyes:  Sclera clear, no icterus.   Conjunctiva pink. Ears:  Normal auditory acuity. Mouth:  No deformity or lesions.  Oropharynx pink & moist. Neck:  Supple; no masses or  thyromegaly. Lungs:  Clear throughout to auscultation.   No wheezes, crackles, or rhonchi. No acute distress. Heart:  Regular rate and rhythm; no murmurs. Abdomen:  Soft, nontender and nondistended. No masses, hepatomegaly. No obvious masses.  Normal bowel sounds.    Rectal:  Per Dr. Marina Goodell  dark and. cherry-red streaked stool grossly Hemoccult-positive Msk:  Symmetrical without gross deformities. Normal posture. Pulses:  Normal pulses noted. Extremities:  Without edema. Neurologic:  Alert and  oriented x4;  grossly normal neurologically. Skin:  Intact without significant lesions or rashes.  Psych:  Alert and cooperative. Normal mood and affect.  Lab Results: Pending  Studies/Results: US Carotid Duplex Bilateral  08/07/2012  *RADIOLOGY REPORT*  Clinical Data: Carotid bruit  BILATERAL CAROTID DUPLEX ULTRASOUND  Technique: Wallace Cullens scale imaging, color Doppler and duplex ultrasound was performed of bilateral carotid and vertebral arteries in the neck.  Comparison:  None.  Criteria:  Quantification of carotid stenosis is based on velocity parameters that correlate the residual internal carotid diameter with NASCET-based stenosis levels, using the diameter of the distal internal carotid lumen as the denominator for stenosis measurement.  The following velocity measurements were obtained:                   PEAK SYSTOLIC/END DIASTOLIC RIGHT ICA:                        76/20cm/sec CCA:  66/13cm/sec SYSTOLIC ICA/CCA RATIO:     1.15 DIASTOLIC ICA/CCA RATIO:    1.57 ECA:                        87cm/sec  LEFT ICA:                        76/25cm/sec CCA:                        73/11cm/sec SYSTOLIC ICA/CCA RATIO:     1.04 DIASTOLIC ICA/CCA RATIO:    2.22 ECA:                        78cm/sec  Findings:  RIGHT CAROTID ARTERY: Some calcific plaque is noted in the region of the carotid bulb extending into the proximal internal carotid artery.  The waveforms, velocities and flow velocity ratios  however demonstrate no evidence of focal hemodynamically significant stenosis.  RIGHT VERTEBRAL ARTERY:  Antegrade in nature  LEFT CAROTID ARTERY: Some calcific plaque is noted in the region of the carotid bulb extending into the proximal internal carotid artery.  Evaluation of the waveforms, velocities and flow velocity ratios however demonstrate no evidence of focal hemodynamically significant stenosis.  LEFT VERTEBRAL ARTERY:  Antegrade in nature  IMPRESSION: Atherosclerotic plaque bilaterally without focal hemodynamically significant stenosis.   Original Report Authenticated By: Phillips Odor, M.D.     Impression / Plan:  #19  78 year old female with recurrent lower GI bleeding, probably secondary to known colonic AVMs. Patient stable hemodynamically at the time of admission. #2  previous history of lower GI bleeding secondary to colonic AVMs requiring endoscopic therapy on 2 previous occasions #3 diverticulosis-that is post remote sigmoid colectomy #4 hypertension #5 COPD #6 hyperlipidemia  Plan; patient is admitted to the [facility] GI service for supportive management Serial hemoglobins and transfusions as indicated She will  need repeat colonoscopy and endoscopic therapy if indicated this admission For details please see the orders.     @RRHLOS @  Amy Esterwood  08/09/2012, 11:10 AM

## 2012-08-11 ENCOUNTER — Encounter (HOSPITAL_COMMUNITY): Payer: Self-pay | Admitting: Gastroenterology

## 2012-08-11 ENCOUNTER — Encounter (HOSPITAL_COMMUNITY): Payer: Self-pay

## 2012-08-13 LAB — TYPE AND SCREEN
ABO/RH(D): B POS
Antibody Screen: NEGATIVE
Unit division: 0

## 2012-08-17 ENCOUNTER — Inpatient Hospital Stay (HOSPITAL_COMMUNITY)
Admission: EM | Admit: 2012-08-17 | Discharge: 2012-08-20 | DRG: 919 | Disposition: A | Discharge status: Home / Self Care | Payer: Medicare Other | Attending: Family Medicine | Admitting: Family Medicine

## 2012-08-17 ENCOUNTER — Encounter (HOSPITAL_COMMUNITY): Payer: Self-pay

## 2012-08-17 DIAGNOSIS — E782 Mixed hyperlipidemia: Secondary | ICD-10-CM | POA: Diagnosis present

## 2012-08-17 DIAGNOSIS — Z79899 Other long term (current) drug therapy: Secondary | ICD-10-CM

## 2012-08-17 DIAGNOSIS — F17210 Nicotine dependence, cigarettes, uncomplicated: Secondary | ICD-10-CM | POA: Diagnosis present

## 2012-08-17 DIAGNOSIS — F17200 Nicotine dependence, unspecified, uncomplicated: Secondary | ICD-10-CM

## 2012-08-17 DIAGNOSIS — K648 Other hemorrhoids: Secondary | ICD-10-CM | POA: Diagnosis present

## 2012-08-17 DIAGNOSIS — I10 Essential (primary) hypertension: Secondary | ICD-10-CM | POA: Diagnosis present

## 2012-08-17 DIAGNOSIS — Y838 Other surgical procedures as the cause of abnormal reaction of the patient, or of later complication, without mention of misadventure at the time of the procedure: Secondary | ICD-10-CM | POA: Diagnosis present

## 2012-08-17 DIAGNOSIS — Z9089 Acquired absence of other organs: Secondary | ICD-10-CM

## 2012-08-17 DIAGNOSIS — R011 Cardiac murmur, unspecified: Secondary | ICD-10-CM | POA: Diagnosis present

## 2012-08-17 DIAGNOSIS — K5730 Diverticulosis of large intestine without perforation or abscess without bleeding: Secondary | ICD-10-CM | POA: Diagnosis present

## 2012-08-17 DIAGNOSIS — J449 Chronic obstructive pulmonary disease, unspecified: Secondary | ICD-10-CM | POA: Diagnosis present

## 2012-08-17 DIAGNOSIS — Z9049 Acquired absence of other specified parts of digestive tract: Secondary | ICD-10-CM

## 2012-08-17 DIAGNOSIS — IMO0002 Reserved for concepts with insufficient information to code with codable children: Principal | ICD-10-CM | POA: Diagnosis present

## 2012-08-17 DIAGNOSIS — J4489 Other specified chronic obstructive pulmonary disease: Secondary | ICD-10-CM | POA: Diagnosis present

## 2012-08-17 DIAGNOSIS — K5521 Angiodysplasia of colon with hemorrhage: Secondary | ICD-10-CM | POA: Diagnosis present

## 2012-08-17 DIAGNOSIS — K922 Gastrointestinal hemorrhage, unspecified: Secondary | ICD-10-CM | POA: Diagnosis present

## 2012-08-17 DIAGNOSIS — K5520 Angiodysplasia of colon without hemorrhage: Secondary | ICD-10-CM

## 2012-08-17 DIAGNOSIS — Y92009 Unspecified place in unspecified non-institutional (private) residence as the place of occurrence of the external cause: Secondary | ICD-10-CM

## 2012-08-17 DIAGNOSIS — D62 Acute posthemorrhagic anemia: Secondary | ICD-10-CM | POA: Diagnosis present

## 2012-08-17 DIAGNOSIS — K625 Hemorrhage of anus and rectum: Secondary | ICD-10-CM

## 2012-08-17 HISTORY — DX: Gastrointestinal hemorrhage, unspecified: K92.2

## 2012-08-17 LAB — SAMPLE TO BLOOD BANK

## 2012-08-17 MED ORDER — SODIUM CHLORIDE 0.9 % IV SOLN
INTRAVENOUS | Status: DC
  Administered 2012-08-17 – 2012-08-19 (×5): via INTRAVENOUS

## 2012-08-17 NOTE — ED Notes (Signed)
Pt c/o rectal bleeding.  Was seen last week by Dr Russella Dar.  Pt had dx of AVM cauterized last Thursday 9/26.  Pt had no bleeding until tonight.  Pt was having diarrhea tonight with blood red x 3.

## 2012-08-18 ENCOUNTER — Encounter (HOSPITAL_COMMUNITY): Payer: Self-pay | Admitting: Internal Medicine

## 2012-08-18 DIAGNOSIS — Q2733 Arteriovenous malformation of digestive system vessel: Secondary | ICD-10-CM

## 2012-08-18 DIAGNOSIS — K5521 Angiodysplasia of colon with hemorrhage: Secondary | ICD-10-CM

## 2012-08-18 DIAGNOSIS — R011 Cardiac murmur, unspecified: Secondary | ICD-10-CM

## 2012-08-18 DIAGNOSIS — K5520 Angiodysplasia of colon without hemorrhage: Secondary | ICD-10-CM

## 2012-08-18 DIAGNOSIS — F17200 Nicotine dependence, unspecified, uncomplicated: Secondary | ICD-10-CM

## 2012-08-18 DIAGNOSIS — D62 Acute posthemorrhagic anemia: Secondary | ICD-10-CM | POA: Diagnosis present

## 2012-08-18 DIAGNOSIS — I10 Essential (primary) hypertension: Secondary | ICD-10-CM | POA: Diagnosis present

## 2012-08-18 DIAGNOSIS — K625 Hemorrhage of anus and rectum: Secondary | ICD-10-CM

## 2012-08-18 DIAGNOSIS — K922 Gastrointestinal hemorrhage, unspecified: Secondary | ICD-10-CM

## 2012-08-18 HISTORY — DX: Angiodysplasia of colon without hemorrhage: K55.20

## 2012-08-18 HISTORY — DX: Acute posthemorrhagic anemia: D62

## 2012-08-18 HISTORY — DX: Gastrointestinal hemorrhage, unspecified: K92.2

## 2012-08-18 LAB — CBC
HCT: 29.6 % — ABNORMAL LOW (ref 36.0–46.0)
HCT: 30.6 % — ABNORMAL LOW (ref 36.0–46.0)
HCT: 33.2 % — ABNORMAL LOW (ref 36.0–46.0)
Hemoglobin: 10.2 g/dL — ABNORMAL LOW (ref 12.0–15.0)
Hemoglobin: 10.2 g/dL — ABNORMAL LOW (ref 12.0–15.0)
Hemoglobin: 11.5 g/dL — ABNORMAL LOW (ref 12.0–15.0)
MCH: 32.5 pg (ref 26.0–34.0)
MCH: 32.6 pg (ref 26.0–34.0)
MCHC: 34.6 g/dL (ref 30.0–36.0)
MCV: 93.9 fL (ref 78.0–100.0)
MCV: 94.1 fL (ref 78.0–100.0)
MCV: 95 fL (ref 78.0–100.0)
Platelets: 249 10*3/uL (ref 150–400)
RBC: 3.04 MIL/uL — ABNORMAL LOW (ref 3.87–5.11)
RBC: 3.13 MIL/uL — ABNORMAL LOW (ref 3.87–5.11)
RBC: 3.14 MIL/uL — ABNORMAL LOW (ref 3.87–5.11)
RBC: 3.53 MIL/uL — ABNORMAL LOW (ref 3.87–5.11)
RDW: 14.1 % (ref 11.5–15.5)
WBC: 8.3 10*3/uL (ref 4.0–10.5)
WBC: 7.3 10*3/uL (ref 4.0–10.5)
WBC: 8.6 10*3/uL (ref 4.0–10.5)

## 2012-08-18 LAB — TYPE AND SCREEN

## 2012-08-18 LAB — COMPREHENSIVE METABOLIC PANEL
BUN: 21 mg/dL (ref 6–23)
CO2: 25 mEq/L (ref 19–32)
Calcium: 9.3 mg/dL (ref 8.4–10.5)
Creatinine, Ser: 0.76 mg/dL (ref 0.50–1.10)
GFR calc Af Amer: >90 mL/min (ref >90)
GFR calc non Af Amer: 79 mL/min — ABNORMAL LOW (ref >90)
Glucose, Bld: 118 mg/dL — ABNORMAL HIGH (ref 70–99)

## 2012-08-18 LAB — PROTIME-INR: Prothrombin Time: 13.2 seconds (ref 11.6–15.2)

## 2012-08-18 LAB — OCCULT BLOOD, POC DEVICE: Fecal Occult Bld: POSITIVE

## 2012-08-18 MED ORDER — ACETAMINOPHEN 500 MG PO TABS: 1000.0000 mg | ORAL_TABLET | Freq: Four times a day (QID) | ORAL | Status: DC | PRN

## 2012-08-18 MED ORDER — OMEGA-3-ACID ETHYL ESTERS 1 G PO CAPS
1.0000 g | ORAL_CAPSULE | Freq: Every day | ORAL | Status: DC
  Administered 2012-08-18 – 2012-08-20 (×3): 1 g via ORAL
  Filled 2012-08-18 (×3): qty 1

## 2012-08-18 MED ORDER — METOCLOPRAMIDE HCL 5 MG/ML IJ SOLN
10.0000 mg | Freq: Once | INTRAMUSCULAR | Status: AC
  Administered 2012-08-18: 10 mg via INTRAVENOUS
  Filled 2012-08-18: qty 2

## 2012-08-18 MED ORDER — AMLODIPINE BESYLATE 5 MG PO TABS
5.0000 mg | ORAL_TABLET | Freq: Every day | ORAL | Status: DC
  Administered 2012-08-18 – 2012-08-20 (×3): 5 mg via ORAL
  Filled 2012-08-18 (×3): qty 1

## 2012-08-18 MED ORDER — ONDANSETRON HCL 4 MG PO TABS: 4.0000 mg | ORAL_TABLET | Freq: Four times a day (QID) | ORAL | Status: DC | PRN

## 2012-08-18 MED ORDER — METOCLOPRAMIDE HCL 5 MG/ML IJ SOLN: 10.0000 mg | Freq: Once | INTRAMUSCULAR | Status: DC

## 2012-08-18 MED ORDER — PEG-KCL-NACL-NASULF-NA ASC-C 100 G PO SOLR
0.5000 | Freq: Once | ORAL | Status: AC
  Administered 2012-08-18: 50 g via ORAL
  Filled 2012-08-18: qty 1

## 2012-08-18 MED ORDER — PEG-KCL-NACL-NASULF-NA ASC-C 100 G PO SOLR
1.0000 | Freq: Once | ORAL | Status: DC
  Filled 2012-08-18: qty 1

## 2012-08-18 MED ORDER — PEG-KCL-NACL-NASULF-NA ASC-C 100 G PO SOLR: 1.0000 | Freq: Once | ORAL | Status: DC

## 2012-08-18 MED ORDER — DEXTROSE-NACL 5-0.9 % IV SOLN
INTRAVENOUS | Status: DC
  Administered 2012-08-18: 05:00:00 via INTRAVENOUS

## 2012-08-18 MED ORDER — METOCLOPRAMIDE HCL 5 MG/ML IJ SOLN
10.0000 mg | Freq: Once | INTRAMUSCULAR | Status: AC
  Administered 2012-08-19: 10 mg via INTRAVENOUS
  Filled 2012-08-18: qty 2

## 2012-08-18 MED ORDER — ONDANSETRON HCL 4 MG/2ML IJ SOLN: 4.0000 mg | Freq: Four times a day (QID) | INTRAMUSCULAR | Status: DC | PRN

## 2012-08-18 MED ORDER — SIMVASTATIN 5 MG PO TABS
5.0000 mg | ORAL_TABLET | Freq: Every day | ORAL | Status: DC
  Administered 2012-08-18 – 2012-08-19 (×2): 5 mg via ORAL
  Filled 2012-08-18 (×3): qty 1

## 2012-08-18 MED ORDER — PEG-KCL-NACL-NASULF-NA ASC-C 100 G PO SOLR
0.5000 | Freq: Once | ORAL | Status: AC
  Administered 2012-08-19: 50 g via ORAL
  Filled 2012-08-18: qty 1

## 2012-08-18 MED ORDER — ADULT MULTIVITAMIN W/MINERALS CH
1.0000 | ORAL_TABLET | Freq: Every day | ORAL | Status: DC
  Administered 2012-08-18 – 2012-08-20 (×3): 1 via ORAL
  Filled 2012-08-18 (×3): qty 1

## 2012-08-18 MED ORDER — ACETAMINOPHEN 325 MG PO TABS
650.0000 mg | ORAL_TABLET | Freq: Four times a day (QID) | ORAL | Status: DC | PRN
  Administered 2012-08-19: 650 mg via ORAL
  Filled 2012-08-18: qty 2

## 2012-08-18 NOTE — H&P (Signed)
[facility] History and Physical  Kathleen Pope ZOX:[id number] DOB: 05-Jan-1933    PCP:   Kathleen Pope., MD   Chief Complaint: Several episodes of BRBPR.  HPI: Kathleen Pope is an 78 y.o. 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  She denied lightheadedness, CP, SOB, or N/V.  She does have mild abdominal cramps. Evaluation in the ER showed stable hemodynamics, Hb of 11.5 grams/DL, Cr of 8.11 with BUN of 21.  Because of her advanced age, hospitalist was asked to admit her for known cecal AVM bleed in order to follow her Hct.  Rewiew of Systems:  Constitutional: Negative for malaise, fever and chills. No significant weight loss or weight gain Eyes: Negative for eye pain, redness and discharge, diplopia, visual changes, or flashes of light. ENMT: Negative for ear pain, hoarseness, nasal congestion, sinus pressure and sore throat. No headaches; tinnitus, drooling, or problem swallowing. Cardiovascular: Negative for chest pain, palpitations, diaphoresis, dyspnea and peripheral edema. ; No orthopnea, PND Respiratory: Negative for cough, hemoptysis, wheezing and stridor. No pleuritic chestpain. Gastrointestinal: Negative for nausea, vomiting, diarrhea, constipation, abdominal pain, melena,  hematemesis, jaundice    Genitourinary: Negative for frequency, dysuria, incontinence,flank pain and hematuria; Musculoskeletal: Negative for back pain and neck pain. Negative for swelling and trauma.;  Skin: . Negative for pruritus, rash, abrasions, bruising and skin lesion.; ulcerations Neuro: Negative for headache, lightheadedness and neck stiffness. Negative for weakness, altered level of consciousness , altered  mental status, extremity weakness, burning feet, involuntary movement, seizure and syncope.  Psych: negative for anxiety, depression, insomnia, tearfulness, panic attacks, hallucinations, paranoia, suicidal or homicidal ideation    Past Medical History  Diagnosis Date  . Chronic airway obstruction, not elsewhere classified   . Lower GI bleed 07/26/2000  . HTN (hypertension)   . Hypercholesteremia   . Anemia   . COPD (chronic obstructive pulmonary disease)     Past Surgical History  Procedure Date  . Colectomy   . Tonsillectomy   . Bunionectomy     left  . Colonoscopy 08/10/2012    Procedure: COLONOSCOPY;  Surgeon: Kathleen Dare, MD,FACG;  Location: [facility] ENDOSCOPY;  Service: Endoscopy;  Laterality: N/A;    Medications:  HOME MEDS: Prior to Admission medications   Medication Sig Start Date End Date Taking? Authorizing Provider  acetaminophen (TYLENOL) 500 MG tablet Take 1,000 mg by mouth every 6 (six) hours as needed. Pain   Yes Historical Provider, MD  amLODipine (NORVASC) 5 MG tablet Take 5 mg by mouth daily.   Yes Historical Provider, MD  calcium carbonate (OS-CAL) 600 MG TABS Take 600 mg by mouth daily.   Yes Historical Provider, MD  ferrous sulfate 325 (65 FE) MG EC tablet Take 325 mg by mouth 3 (three) times daily with meals.   Yes Historical Provider, MD  fish oil-omega-3 fatty acids 1000 MG capsule Take 1 g by mouth daily.   Yes Historical Provider, MD  Multiple Vitamin (MULTIVITAMIN) tablet Take 1 tablet by mouth daily.   Yes Historical Provider, MD  pravastatin (PRAVACHOL) 10 MG tablet Take 10 mg by mouth daily.   Yes Historical Provider, MD     Allergies:  No Known Allergies  Social History:   reports that  she has been smoking Cigarettes.  She has a 47.2 pack-year smoking history. She has never used smokeless tobacco. She reports that she does not drink alcohol or use illicit drugs.  Family History: Family History  Problem Relation Age of Onset  . Breast cancer  Mother   . Lung cancer Father   . Breast cancer Sister   . Anuerysm Brother     in stomach     Physical Exam: Filed Vitals:   08/17/12 2140 08/18/12 0017 08/18/12 0018 08/18/12 0018  BP: 126/72 120/65 125/75 111/74  Pulse: 100 74 80 87  Temp: 98.7 F (37.1 C)     TempSrc: Oral     Resp: 18     SpO2: 100% 97% 96% 98%   Blood pressure 111/74, pulse 87, temperature 98.7 F (37.1 C), temperature source Oral, resp. rate 18, SpO2 98.00%.  GEN:  Pleasant  patient lying in the stretcher in no acute distress; cooperative with exam. PSYCH:  alert and oriented x4; does not appear anxious or depressed; affect is appropriate. HEENT: Mucous membranes pink and anicteric; PERRLA; EOM intact; no cervical lymphadenopathy nor thyromegaly or carotid bruit; no JVD; There were no stridor. Neck is very supple. Breasts:: Not examined CHEST WALL: No tenderness CHEST: Normal respiration, clear to auscultation bilaterally.  HEART: Regular rate and rhythm.  There is a III/IV SEM consistent with AS. BACK: No kyphosis or scoliosis; no CVA tenderness ABDOMEN: soft and non-tender; no masses, no organomegaly, normal abdominal bowel sounds; no pannus; no intertriginous candida. There is no rebound and no distention. Rectal Exam: Not done EXTREMITIES: No bone or joint deformity; age-appropriate arthropathy of the hands and knees; no edema; no ulcerations.  There is no calf tenderness. Genitalia: not examined PULSES: 2+ and symmetric SKIN: Normal hydration no rash or ulceration CNS: Cranial nerves 2-12 grossly intact no focal lateralizing neurologic deficit.  Speech is fluent; uvula elevated with phonation, facial symmetry and tongue midline. DTR are normal bilaterally, cerebella exam is intact, barbinski is negative and strengths are equaled bilaterally.  No sensory loss.   Labs on Admission:  Basic Metabolic Panel:  Lab 08/17/12 1610  NA 138  K 4.1  CL 100  CO2 25  GLUCOSE 118*  BUN 21  CREATININE 0.76   CALCIUM 9.3  MG --  PHOS --   Liver Function Tests:  Lab 08/17/12 2330  AST 26  ALT 15  ALKPHOS 64  BILITOT 0.2*  PROT 6.5  ALBUMIN 3.7   No results found for this basename: LIPASE:5,AMYLASE:5 in the last 168 hours No results found for this basename: AMMONIA:5 in the last 168 hours CBC:  Lab 08/17/12 2330  WBC 12.1*  NEUTROABS --  HGB 11.5*  HCT 33.2*  MCV 94.1  PLT 354   Cardiac Enzymes: No results found for this basename: CKTOTAL:5,CKMB:5,CKMBINDEX:5,TROPONINI:5 in the last 168 hours  CBG: No results found for this basename: GLUCAP:5 in the last 168 hours   Radiological Exams on Admission: No results found.   Assessment/Plan Present on Admission:  .Murmur .Smoker .Mixed hyperlipidemia .Chronic lower GI bleeding   PLAN:  Will admit her to general medical floor.  Follow her H/H every 6 hours.  If she continues to bleed, I suspect she will need to be re-scope.  If she stops bleeding, I am not convinced the utility of being rescoped, but will defer to GI.  Please consult Dr Russella Dar.  I have considered conjugated estrogen, but will defer that to GI as well.  She is  very stable, but I will hold her Norvasc until we are sure she is not bleeding again.  For her murmur, her echo did not show any significant AS, so we'll follow.  I have recommended that she stop using cigarettes.  She is full code and will be admitted to [facility] service.  Other plans as per orders.  Code Status:FULL CODE.   Houston Siren, MD. [facility] Pager [phone] 7pm to 7am.  08/18/2012, 1:51 AM

## 2012-08-18 NOTE — Consult Note (Signed)
Referring Provider: Dr. Irene Limbo Primary Care Physician:  Cassell Smiles., MD Primary Gastroenterologist:  Dr. Marina Goodell  Reason for Consultation:  GIB  HPI: Kathleen Pope is a 78 y.o. female who is known to Dr. Marina Goodell.  She was recently admitted to [facility] hospital and discharged on 9/26 after suffering a GIB that was secondary to cecal AVM's.  She had been admitted from the office on that occasion for dark stools with blood streaks.  She underwent a colonoscopy by Dr. Russella Dar on 9/26 and was found to have cecal AVM's (one was oozing) to which APC was applied.  Colonoscopy prep was only fair, according to Dr. Ardell Isaacs report, and it was recommended that if she were to bleed again then a repeat colonoscopy may need to be considered.  On discharge her Hgb was 12.9 grams.  Her ASA was to be held for two weeks, and she has not been taking it.  She returned to [facility] hospital on the evening of 10/3 with complaints of recurrent bleeding.  Started around 730 last night.  Described as bloody diarrhea, about 3 times before coming to the ED, then continued while she is here.  Last was about a couple of hours ago and the blood is becoming less in amounts.  Hgb in ER  Was 11.5 grams, then dropping to 10.1 grams, but has then been stable over the last 5 hours.  Denies abdominal pain, nausea, vomiting, dizziness, SOB, chest pain, fatigue, weakness, etc.  Patient has remote history of GI bleed dating back 10-15 years, and at that time her bleeding was felt to be diverticular, and she underwent a sigmoid colectomy. Unfortunately her bleeding recurred thereafter and she was transferred to our institution. She had colonoscopy which showed bleeding AVMs in the cecum. She underwent endoscopic treatment with cauterization and did well thereafter without any problems. She has been maintained on chronic iron therapy. She had colonoscopy in 2006 for asymptomatic Hemoccult positive stool without anemia ,at that time no significant abnormalities  were noted. In August of 2008 she had of recurrent hematochezia and underwent colonoscopy while she was hospitalized. At that time she was found to have 2 cecal AVMs which were treated endoscopically.  After that episode she did not have any further issues until the most recent occasion just one week ago.   Past Medical History  Diagnosis Date  . Lower GI bleed 07/26/2000  . HTN (hypertension)   . Hypercholesteremia   . Anemia   . COPD (chronic obstructive pulmonary disease)     Past Surgical History  Procedure Date  . Colectomy (sigmoid colectomy for diverticular disease)   . Tonsillectomy   . Bunionectomy     left  . Colonoscopy 08/10/2012    Procedure: COLONOSCOPY;  Surgeon: Meryl Dare, MD,FACG;  Location: [facility];  Service: Endoscopy;  Laterality: N/A;    Prior to Admission medications   Medication Sig Start Date End Date Taking? Authorizing Provider  acetaminophen (TYLENOL) 500 MG tablet Take 1,000 mg by mouth every 6 (six) hours as needed. Pain   Yes Historical Provider, MD  amLODipine (NORVASC) 5 MG tablet Take 5 mg by mouth daily.   Yes Historical Provider, MD  calcium carbonate (OS-CAL) 600 MG TABS Take 600 mg by mouth daily.   Yes Historical Provider, MD  ferrous sulfate 325 (65 FE) MG EC tablet Take 325 mg by mouth 3 (three) times daily with meals.   Yes Historical Provider, MD  fish oil-omega-3 fatty acids 1000 MG capsule Take  1 g by mouth daily.   Yes Historical Provider, MD  Multiple Vitamin (MULTIVITAMIN) tablet Take 1 tablet by mouth daily.   Yes Historical Provider, MD  pravastatin (PRAVACHOL) 10 MG tablet Take 10 mg by mouth daily.   Yes Historical Provider, MD    Current Facility-Administered Medications  Medication Dose Route Frequency Provider Last Rate Last Dose  . 0.9 %  sodium chloride infusion   Intravenous Continuous Ward Givens, MD 100 mL/hr at 08/18/12 0950    . acetaminophen (TYLENOL) tablet 650 mg  650 mg Oral Q6H PRN Standley Brooking, MD       . amLODipine (NORVASC) tablet 5 mg  5 mg Oral Daily Standley Brooking, MD      . multivitamin with minerals tablet 1 tablet  1 tablet Oral Daily Houston Siren, MD   1 tablet at 08/18/12 0954  . omega-3 acid ethyl esters (LOVAZA) capsule 1 g  1 g Oral Daily Houston Siren, MD   1 g at 08/18/12 0954  . ondansetron (ZOFRAN) tablet 4 mg  4 mg Oral Q6H PRN Houston Siren, MD       Or  . ondansetron [facility]) injection 4 mg  4 mg Intravenous Q6H PRN Houston Siren, MD      . simvastatin (ZOCOR) tablet 5 mg  5 mg Oral q1800 Houston Siren, MD      . DISCONTD: acetaminophen (TYLENOL) tablet 1,000 mg  1,000 mg Oral Q6H PRN Houston Siren, MD      . DISCONTD: dextrose 5 %-0.9 % sodium chloride infusion   Intravenous Continuous Houston Siren, MD 100 mL/hr at 08/18/12 0436      Allergies as of 08/17/2012  . (No Known Allergies)    Family History  Problem Relation Age of Onset  . Breast cancer Mother   . Lung cancer Father   . Breast cancer Sister   . Anuerysm Brother     in stomach    History   Social History  . Marital Status: Widowed    Spouse Name: N/A    Number of Children: 1  . Years of Education: N/A   Occupational History  . retired    Social History Main Topics  . Smoking status: Current Every Day Smoker -- 0.8 packs/day for 59 years    Types: Cigarettes  . Smokeless tobacco: Never Used  . Alcohol Use: No  . Drug Use: No  . Sexually Active: Not on file   Other Topics Concern  . Not on file   Social History Narrative  . No narrative on file    Review of Systems: Ten point ROS O/W negative except as mentioned in HPI.  Physical Exam: Vital signs in last 24 hours: Temp:  [98.5 F (36.9 C)-98.7 F (37.1 C)] 98.5 F (36.9 C) (10/04 0255) Pulse Rate:  [74-100] 76  (10/04 0332) Resp:  [18] 18  (10/04 0255) BP: (111-144)/(65-75) 127/71 mmHg (10/04 0330) SpO2:  [90 %-100 %] 98 % (10/04 0332) Weight:  [103 lb (46.72 kg)] 103 lb (46.72 kg) (10/04 0255) Last BM Date: 08/18/12 General:   Alert,   Well-developed, well-nourished, pleasant and cooperative in NAD Head:  Normocephalic and atraumatic. Eyes:  Sclera clear, no icterus.  Conjunctiva pink. Ears:  Normal auditory acuity. Mouth:  No deformity or lesions.   Lungs:  Clear throughout to auscultation.  No wheezes, crackles, or rhonchi.  Heart:  Regular rate and rhythm; 3/6 SEM noted Abdomen:  Soft, nontender, BS active, nonpalp mass or hsm.  Rectal:  Deferred.  Msk:  Symmetrical without gross deformities. Pulses:  Normal pulses noted. Extremities:  Without clubbing or edema. Neurologic:  Alert and  oriented x4;  grossly normal neurologically. Skin:  Intact without significant lesions or rashes. Psych:  Alert and cooperative. Normal mood and affect.  Intake/Output from previous day: 10/03 0701 - 10/04 0700 In: 1 [P.O.:1] Out: -   Lab Results:  Basename 08/18/12 0824 08/18/12 0310 08/17/12 2330  WBC 7.3 8.3 12.1*  HGB 10.2* 10.1* 11.5*  HCT 29.6* 28.5* 33.2*  PLT 293 249 354   BMET  Basename 08/17/12 2330  NA 138  K 4.1  CL 100  CO2 25  GLUCOSE 118*  BUN 21  CREATININE 0.76  CALCIUM 9.3   LFT  Basename 08/17/12 2330  PROT 6.5  ALBUMIN 3.7  AST 26  ALT 15  ALKPHOS 64  BILITOT 0.2*  BILIDIR --  IBILI --   PT/INR  Basename 08/18/12 0230  LABPROT 13.2  INR 1.01    IMPRESSION:  -LGIB:  Likely secondary to her known cecal AVM's. -History of cecal AVM's with bleeding in the past, requiring endoscopic ablation on three occasions. -ABL anemia:  Secondary to the above.  Hgb down, but currently stable. -S/P sigmoid colectomy several years ago for diverticular disease  PLAN: -Monitor Hgb and transfuse prn. -Will discuss with Dr. Leone Payor regarding repeat colonoscopic therapy.  ZEHR, JESSICA D.  08/18/2012, 10:05 AM  Pager number [phone]  [facility] Attending  I have also seen and assessed the patient and agree with the above note. My hx and PE same.  She has recurrent hematochezia about 1 week  after ablation of AVM's at colonoscopy, and the prep was fair so perhaps has other bleeding AVM's. Plan repeat colonoscopy tomorrow The risks and benefits as well as alternatives of endoscopic procedure(s) have been discussed and reviewed. All questions answered. The patient agrees to proceed.  Iva Boop, MD, [facility] [phone] (pager) 08/18/2012 5:01 PM

## 2012-08-18 NOTE — ED Provider Notes (Cosign Needed)
History     CSN: [id number]  Arrival date & time 08/17/12  2135   First MD Initiated Contact with Patient 08/17/12 2302      Chief Complaint  Patient presents with  . Rectal Bleeding    (Consider location/radiation/quality/duration/timing/severity/associated sxs/prior treatment) HPI  Patient reports about 7:30 PM she started having bright red rectal bleeding and had about 3 episodes. She denies feeling weak or dizzy. She denies having chest pain or shortness of breath. She states she feels like she has a "rolling" in her lower abdomen. She relates she's had similar episodes in the past, the first time was in 2001 and she required 9 transfusions of red blood cells, the second episode was in 2007. She reports last month she was seen by Dr. Russella Dar and had cautery done of some AVM malformations in her GI tract.  PCP Dr. Sherwood Gambler GI Dr. Marina Goodell  Past Medical History  Diagnosis Date  . Chronic airway obstruction, not elsewhere classified   . Lower GI bleed 07/26/2000  . HTN (hypertension)   . Hypercholesteremia   . Anemia   . COPD (chronic obstructive pulmonary disease)     Past Surgical History  Procedure Date  . Colectomy   . Tonsillectomy   . Bunionectomy     left  . Colonoscopy 08/10/2012    Procedure: COLONOSCOPY;  Surgeon: Meryl Dare, MD,FACG;  Location: [facility];  Service: Endoscopy;  Laterality: N/A;    Family History  Problem Relation Age of Onset  . Breast cancer Mother   . Lung cancer Father   . Breast cancer Sister   . Anuerysm Brother     in stomach     History   Social History  . Marital Status: Widowed    Spouse Name: N/A    Number of Children: 1  . Years of Education: N/A   Occupational History  . retired    Social History Main Topics  . Smoking status: Current Every Day Smoker -- 0.8 packs/day for 59 years    Types: Cigarettes  . Smokeless tobacco: Never Used  . Alcohol Use: No  . Drug Use: No  . Sexually Active: Not on file        OB History    Grav Para Term Preterm Abortions TAB SAB Ect Mult Living                  Review of Systems  All other systems reviewed and are negative.    Allergies  Review of patient's allergies indicates no known allergies.  Home Medications   Current Outpatient Rx  Name Route Sig Dispense Refill  . ACETAMINOPHEN 500 MG PO TABS Oral Take 1,000 mg by mouth every 6 (six) hours as needed. Pain    . AMLODIPINE BESYLATE 5 MG PO TABS Oral Take 5 mg by mouth daily.    Marland Kitchen CALCIUM CARBONATE 600 MG PO TABS Oral Take 600 mg by mouth daily.    Marland Kitchen FERROUS SULFATE 325 (65 FE) MG PO TBEC Oral Take 325 mg by mouth 3 (three) times daily with meals.    . OMEGA-3 FATTY ACIDS 1000 MG PO CAPS Oral Take 1 g by mouth daily.    Marland Kitchen ONE-DAILY MULTI VITAMINS PO TABS Oral Take 1 tablet by mouth daily.    Marland Kitchen PRAVASTATIN SODIUM 10 MG PO TABS Oral Take 10 mg by mouth daily.      BP 111/74  Pulse 87  Temp 98.7 F (37.1 C) (Oral)  Resp 18  SpO2 98%  Vital signs normal   Orthostatic vital signs normal   Physical Exam  Nursing note and vitals reviewed. Constitutional: She is oriented to person, place, and time. She appears well-developed and well-nourished.  Non-toxic appearance. She does not appear ill. No distress.  HENT:  Head: Normocephalic and atraumatic.  Right Ear: External ear normal.  Left Ear: External ear normal.  Nose: Nose normal. No mucosal edema or rhinorrhea.  Mouth/Throat: Oropharynx is clear and moist and mucous membranes are normal. No dental abscesses or uvula swelling.  Eyes: Conjunctivae normal and EOM are normal. Pupils are equal, round, and reactive to light.  Neck: Normal range of motion and full passive range of motion without pain. Neck supple.  Cardiovascular: Normal rate and regular rhythm.  Exam reveals no gallop and no friction rub.   Murmur heard.  Systolic murmur is present  Pulmonary/Chest: Effort normal and breath sounds normal. No respiratory distress.  She has no wheezes. She has no rhonchi. She has no rales. She exhibits no tenderness and no crepitus.  Abdominal: Soft. Normal appearance and bowel sounds are normal. She exhibits no distension. There is no tenderness. There is no rebound and no guarding.  Musculoskeletal: Normal range of motion. She exhibits no edema and no tenderness.       Moves all extremities well.   Neurological: She is alert and oriented to person, place, and time. She has normal strength. No cranial nerve deficit.  Skin: Skin is warm, dry and intact. No rash noted. No erythema. No pallor.  Psychiatric: She has a normal mood and affect. Her speech is normal and behavior is normal. Her mood appears not anxious.    ED Course  Procedures (including critical care time)   Medications  0.9 %  sodium chloride infusion (  Intravenous New Bag/Given 08/17/12 2347)   Pt passed BRBPR while in the ED.  Review of prior note shows patient had hemoglobin of 14.8 on September 26. She had rectal bleeding felt to be from AV malformations in her cecum and had ablation done.  01:17 Dr Conley Rolls admit to obs, med-surg bed, team 8  Results for orders placed during the hospital encounter of 08/17/12  CBC      Component Value Range   WBC 12.1 (*) 4.0 - 10.5 K/uL   RBC 3.53 (*) 3.87 - 5.11 MIL/uL   Hemoglobin 11.5 (*) 12.0 - 15.0 g/dL   HCT 91.4 (*) 78.2 - 95.6 %   MCV 94.1  78.0 - 100.0 fL   MCH 32.6  26.0 - 34.0 pg   MCHC 34.6  30.0 - 36.0 g/dL   RDW 21.3  08.6 - 57.8 %   Platelets 354  150 - 400 K/uL  COMPREHENSIVE METABOLIC PANEL      Component Value Range   Sodium 138  135 - 145 mEq/L   Potassium 4.1  3.5 - 5.1 mEq/L   Chloride 100  96 - 112 mEq/L   CO2 25  19 - 32 mEq/L   Glucose, Bld 118 (*) 70 - 99 mg/dL   BUN 21  6 - 23 mg/dL   Creatinine, Ser 4.69  0.50 - 1.10 mg/dL   Calcium 9.3  8.4 - 62.9 mg/dL   Total Protein 6.5  6.0 - 8.3 g/dL   Albumin 3.7  3.5 - 5.2 g/dL   AST 26  0 - 37 U/L   ALT 15  0 - 35 U/L   Alkaline  Phosphatase 64  39 - 117  U/L   Total Bilirubin 0.2 (*) 0.3 - 1.2 mg/dL   GFR calc non Af Amer 79 (*) >90 mL/min   GFR calc Af Amer >90  >90 mL/min  SAMPLE TO BLOOD BANK      Component Value Range   Blood Bank Specimen SAMPLE AVAILABLE FOR TESTING     Sample Expiration 08/20/2012    OCCULT BLOOD, POC DEVICE      Component Value Range   Fecal Occult Bld POSITIVE     Laboratory interpretation all normal except mild  Anemia compared to most recent Hb's    1. Bright red rectal bleeding     Plan admission  Devoria Albe, MD, FACEP   MDM    CRITICAL CARE Performed by: Devoria Albe L   Total critical care time: 31 min  Critical care time was exclusive of separately billable procedures and treating other patients.  Critical care was necessary to treat or prevent imminent or life-threatening deterioration.  Critical care was time spent personally by me on the following activities: development of treatment plan with patient and/or surrogate as well as nursing, discussions with consultants, evaluation of patient's response to treatment, examination of patient, obtaining history from patient or surrogate, ordering and performing treatments and interventions, ordering and review of laboratory studies, ordering and review of radiographic studies, pulse oximetry and re-evaluation of patient's condition.       Ward Givens, MD 08/18/12 1610  Ward Givens, MD 08/18/12 [phone]

## 2012-08-18 NOTE — Care Management Note (Signed)
    Page 1 of 1   08/21/2012     8:25:43 AM   CARE MANAGEMENT NOTE 08/21/2012  Patient:  Kathleen Pope, Kathleen Pope   Account Number:  [id number]  Date Initiated:  08/18/2012  Documentation initiated by:  Lorenda Ishihara  Subjective/Objective Assessment:   78 yo female admitted with hematochezia. PTA lived at home alone.     Action/Plan:   Anticipated DC Date:  08/21/2012   Anticipated DC Plan:  HOME/SELF CARE      DC Planning Services  CM consult      Choice offered to / List presented to:             Status of service:  Completed, signed off Medicare Important Message given?   (If response is "NO", the following Medicare IM given date fields will be blank) Date Medicare IM given:   Date Additional Medicare IM given:    Discharge Disposition:  HOME/SELF CARE  Per UR Regulation:  Reviewed for med. necessity/level of care/duration of stay  If discussed at Long Length of Stay Meetings, dates discussed:    Comments:

## 2012-08-18 NOTE — Progress Notes (Addendum)
[facility] PROGRESS NOTE  Kathleen Pope ZOX:[id number] DOB: 10-31-1933 DOA: 08/17/2012 PCP: Cassell Smiles., MD GI: Yancey Flemings, MD  Assessment/Plan: 1. GIB--continues intermittently per patient. Presumed lower based on HPI and clinical history (cecal AVM). GI consult placed.  2. ABLA--slight decrease from 10/3, down from 14.2 08/09/2012 but stable over last 5 hours. Serial CBC, GI consult. 3. History of cecal AVM 4. HTN--stable. Resume Norvasc. 5. COPD--stable.  6. Cigarette smoker--I recommended cessation to patient, patient declines nicotine patch  Code Status: Full code Family Communication: none present Disposition Plan: home when improved  Brendia Sacks, MD  [facility] Team 6 Pager [phone]. If 8PM-8AM, please contact night-coverage at [email or web address], password [facility] 08/18/2012, 9:08 AM  LOS: 1 day   Brief narrative: 78 year old woman presented to ED with rectal bleeding. PMH cecal AVM cauterized 9/26. Discharged 9/26. Admitted for recurrent GIB.  Consultants:  [facility]  Procedures:    HPI/Subjective: Afebrile, vitals stable. Reports intermittent bleeding, no pain. No other complaints.   Objective: Filed Vitals:   08/18/12 0018 08/18/12 0255 08/18/12 0330 08/18/12 0332  BP: 111/74 144/72 127/71   Pulse: 87 76  76  Temp:  98.5 F (36.9 C)    TempSrc:  Oral    Resp:  18    Height:  5' 1.5" (1.562 m)    Weight:  46.72 kg (103 lb)    SpO2: 98% 90%  98%    Intake/Output Summary (Last 24 hours) at 08/18/12 0908 Last data filed at 08/18/12 0622  Gross per 24 hour  Intake      1 ml  Output      0 ml  Net      1 ml   Filed Weights   08/18/12 0255  Weight: 46.72 kg (103 lb)    Exam:  General:  Appears calm and comfortable Cardiovascular: RRR, no m/r/g. No LE edema. Respiratory: CTA bilaterally, no w/r/r. Normal respiratory effort. Abdomen: soft, ntnd Psychiatric: grossly normal mood and affect, speech fluent and appropriate  Data  Reviewed: Basic Metabolic Panel:  Lab 08/17/12 8295  NA 138  K 4.1  CL 100  CO2 25  GLUCOSE 118*  BUN 21  CREATININE 0.76  CALCIUM 9.3  MG --  PHOS --   Liver Function Tests:  Lab 08/17/12 2330  AST 26  ALT 15  ALKPHOS 64  BILITOT 0.2*  PROT 6.5  ALBUMIN 3.7   CBC:  Lab 08/18/12 0824 08/18/12 0310 08/17/12 2330  WBC 7.3 8.3 12.1*  NEUTROABS -- -- --  HGB 10.2* 10.1* 11.5*  HCT 29.6* 28.5* 33.2*  MCV 94.6 93.8 94.1  PLT 293 249 354   Studies: No results found.  Scheduled Meds:   . multivitamin with minerals  1 tablet Oral Daily  . omega-3 acid ethyl esters  1 g Oral Daily  . simvastatin  5 mg Oral q1800   Continuous Infusions:   . sodium chloride 100 mL/hr at 08/17/12 2347  . dextrose 5 % and 0.9% NaCl 100 mL/hr at 08/18/12 0436    Principal Problem:  *GIB (gastrointestinal bleeding) Active Problems:  Smoker  AVM (arteriovenous malformation) of colon with hemorrhage  HTN (hypertension)  Acute blood loss anemia     Brendia Sacks, MD  [facility] Team 6 Pager [phone]. If 8PM-8AM, please contact night-coverage at [email or web address], password [facility] 08/18/2012, 9:08 AM  LOS: 1 day   Time spent: 25 minutes

## 2012-08-18 NOTE — ED Notes (Signed)
Pt reports stools that were little dark in the morning, but did not see any blood until 1930 in the evening. Pt reports, "It just gushed out of me. Two more times it did that." Pt c/o hx of rectal bleeding, most recent episode was last week. Pt had colonoscopy last week, pt reports "two blood vessels that were leaking and the third one looked iffy."

## 2012-08-19 ENCOUNTER — Encounter (HOSPITAL_COMMUNITY)
Admission: EM | Disposition: A | Discharge status: Home / Self Care | Payer: Self-pay | Source: Home / Self Care | Attending: Family Medicine

## 2012-08-19 ENCOUNTER — Encounter (HOSPITAL_COMMUNITY): Payer: Self-pay | Admitting: *Deleted

## 2012-08-19 HISTORY — PX: COLONOSCOPY: SHX5424

## 2012-08-19 LAB — CBC
Hemoglobin: 9.7 g/dL — ABNORMAL LOW (ref 12.0–15.0)
MCH: 32.2 pg (ref 26.0–34.0)
MCHC: 33.9 g/dL (ref 30.0–36.0)

## 2012-08-19 SURGERY — COLONOSCOPY
Anesthesia: Moderate Sedation

## 2012-08-19 MED ORDER — FENTANYL CITRATE 0.05 MG/ML IJ SOLN
INTRAMUSCULAR | Status: DC | PRN
  Administered 2012-08-19 (×2): 25 ug via INTRAVENOUS

## 2012-08-19 MED ORDER — SODIUM CHLORIDE 0.9 % IV SOLN: INTRAVENOUS | Status: DC

## 2012-08-19 MED ORDER — MIDAZOLAM HCL 5 MG/5ML IJ SOLN
INTRAMUSCULAR | Status: DC | PRN
  Administered 2012-08-19: 2 mg via INTRAVENOUS
  Administered 2012-08-19: 1 mg via INTRAVENOUS
  Administered 2012-08-19: 2 mg via INTRAVENOUS

## 2012-08-19 MED ORDER — EPINEPHRINE HCL 0.1 MG/ML IJ SOLN
INTRAMUSCULAR | Status: AC
  Filled 2012-08-19: qty 10

## 2012-08-19 NOTE — Progress Notes (Signed)
[facility] PROGRESS NOTE  ARIEL DIMITRI ZOX:[id number] DOB: 13-Dec-1932 DOA: 08/17/2012 PCP: Cassell Smiles., MD GI: Yancey Flemings, MD  Assessment/Plan: 1. GIB--secondary to cecal AVM. Follow CBC, home 1-2 days. No  NSAIDS/ASA/anticoagulants, avoid MRI x 1 month due to clips 2. ABLA--stable, repeat CBC in AM, secondary to GIB. 3. History of cecal AVM 4. HTN--stable. Continue Norvasc. 5. COPD--stable.  6. Cigarette smoker--I recommended cessation to patient, patient declines nicotine patch  Code Status: Full code Family Communication: discussed with daughter at bedside Disposition Plan: home when improved  Brendia Sacks, MD  [facility] Team 6 Pager [phone]. If 8PM-8AM, please contact night-coverage at [email or web address], password [facility] 08/19/2012, 3:07 PM  LOS: 2 days   Brief narrative: 78 year old woman presented to ED with rectal bleeding. PMH cecal AVM cauterized 9/26. Discharged 9/26. Admitted for recurrent GIB.  Consultants:  [facility]  Procedures:  10/5 colonoscopy--ENDOSCOPIC IMPRESSION: 1. Small ulcer with non-bleeding visible vesselat the cecum; injection was given to prevent more and control bleeding. 1:10,000 Epinephrine solution; 4 hemoclips placed also - 3 adherent. This ulcer is a post-treatment ulcer from previous APC ablation Another smaller cecal ulcer with clean-base also seen. 2. Mild diverticulosis was noted in the right colon 3. Small internal hemorrhoids 4. The colon mucosa was otherwise normal good prep   HPI/Subjective: No new complaints, no pain. Had bleeding up to procedure.   Objective: Filed Vitals:   08/19/12 0848 08/19/12 0859 08/19/12 1028 08/19/12 1400  BP: 121/62 133/71 126/58 120/70  Pulse:    78  Temp:   97.8 F (36.6 C) 98 F (36.7 C)  TempSrc:   Oral Oral  Resp: 14 16 16 16   Height:      Weight:      SpO2: 98% 99% 98% 96%    Intake/Output Summary (Last 24 hours) at 08/19/12 1507 Last data filed at 08/19/12 1400  Gross per 24  hour  Intake   1990 ml  Output    625 ml  Net   1365 ml   Filed Weights   08/18/12 0255  Weight: 46.72 kg (103 lb)    Exam:  General:  Appears calm and comfortable Cardiovascular: RRR, no m/r/g. No LE edema. Respiratory: CTA bilaterally, no w/r/r. Normal respiratory effort. Psychiatric: grossly normal mood and affect, speech fluent and appropriate  Data Reviewed: Basic Metabolic Panel:  Lab 08/17/12 8295  NA 138  K 4.1  CL 100  CO2 25  GLUCOSE 118*  BUN 21  CREATININE 0.76  CALCIUM 9.3  MG --  PHOS --   Liver Function Tests:  Lab 08/17/12 2330  AST 26  ALT 15  ALKPHOS 64  BILITOT 0.2*  PROT 6.5  ALBUMIN 3.7   CBC:  Lab 08/19/12 0440 08/18/12 2100 08/18/12 1501 08/18/12 0824 08/18/12 0310  WBC 7.1 8.6 7.6 7.3 8.3  NEUTROABS -- -- -- -- --  HGB 9.7* 10.6* 10.2* 10.2* 10.1*  HCT 28.6* 30.6* 29.5* 29.6* 28.5*  MCV 95.0 95.0 93.9 94.6 93.8  PLT 298 304 291 293 249   Studies: No results found.  Scheduled Meds:    . amLODipine  5 mg Oral Daily  . metoCLOPramide (REGLAN) injection  10 mg Intravenous Once  . metoCLOPramide (REGLAN) injection  10 mg Intravenous Once  . metoCLOPramide (REGLAN) injection  10 mg Intravenous Once  . multivitamin with minerals  1 tablet Oral Daily  . omega-3 acid ethyl esters  1 g Oral Daily  . peg 3350 powder  0.5 kit Oral Once  Followed by  . peg 3350 powder  0.5 kit Oral Once  . simvastatin  5 mg Oral q1800   Continuous Infusions:    . sodium chloride 100 mL/hr at 08/19/12 1333  . DISCONTD: sodium chloride      Principal Problem:  *GI hemorrhage from post-treatment cecal ulcer Active Problems:  Smoker  AVM (arteriovenous malformation) of colon with hemorrhage  HTN (hypertension)  Acute blood loss anemia     Brendia Sacks, MD  [facility] Team 6 Pager [phone]. If 8PM-8AM, please contact night-coverage at [email or web address], password [facility] 08/19/2012, 3:07 PM  LOS: 2 days   Time spent: 15  minutes

## 2012-08-19 NOTE — Op Note (Signed)
[facility] [address] [place], [address]   COLONOSCOPY PROCEDURE REPORT  PATIENT: Kathleen Pope, Kathleen Pope  MR#: [id number] BIRTHDATE: Mar 22, 1933 , 79  yrs. old GENDER: Female ENDOSCOPIST: Iva Boop, MD, [facility] REFERRED XL:KGMWN Hospitalist PROCEDURE DATE:  08/19/2012 PROCEDURE:   Colonoscopy with control of bleeding ASA CLASS:   Class III INDICATIONS:hematochezia, lower GI bleed about 1 week after APC treatment of cecal AVM's. MEDICATIONS: Fentanyl 50 mcg IV and Versed 5 mg IV  DESCRIPTION OF PROCEDURE:   After the risks benefits and alternatives of the procedure were thoroughly explained, informed consent was obtained.  A digital rectal exam revealed no abnormalities of the rectum.   The [id number])  endoscope was introduced through the anus and advanced to the cecum, which was identified by both the appendix and ileocecal valve. No adverse events experienced.   The quality of the prep was good, using MoviPrep  The instrument was then slowly withdrawn as the colon was fully examined.      COLON FINDINGS: A small ulcer (4-5 mm) was found at the cecum. There was a non-bleeding visible vessel in the ulcer.  A 1:10,000 Epinephrine solution injection was given to prevent further bleeding. 4 cc total.   Four (4) hemoclip  placements were made to prevent further bleeding. 3 were adherent. there was some difficulty maintining appropriate placement across the ulcer but after these three that was accomplished.  There was no blood loss from the maneuver. Another smaller ulcer with clean base also seen in cecum- no therapy.   Mild diverticulosis was noted The finding was in the right colon.   Small internal hemorrhoids were found. The colon mucosa was otherwise normal.  Retroflexed views revealed internal hemorrhoids. The time to cecum=6 minutes 0 seconds. Withdrawal time=15 minutes 0 seconds.  The scope was withdrawn and the procedure  completed. COMPLICATIONS: There were no complications.  ENDOSCOPIC IMPRESSION: 1.   Small ulcer with non-bleeding visible vesselat the cecum; injection was given to prevent more and control bleeding.  1:10,000 Epinephrine solution;  4 hemoclips placed also - 3 adherent. This ulcer is a post-treatment ulcer from previous APC ablation Another smaller cecal ulcer with clean-base also seen. 2.   Mild diverticulosis was noted in the right colon 3.   Small internal hemorrhoids 4.   The colon mucosa was otherwise normal good prep  RECOMMENDATIONS: clears observe x 24-48 hours for durability of therapy no NSAIDS/ASA/anticoagulants avoid MRI x 1 month due to clips   eSigned:  Iva Boop, MD, [facility] 08/19/2012 8:46 AM   cc: Elfredia Nevins, MD and The Patient   PATIENT NAME:  Kathleen Pope, Kathleen Pope MR#: [id number]

## 2012-08-20 ENCOUNTER — Encounter (HOSPITAL_COMMUNITY): Payer: Self-pay | Admitting: Internal Medicine

## 2012-08-20 LAB — CBC
Hemoglobin: 9.2 g/dL — ABNORMAL LOW (ref 12.0–15.0)
MCHC: 34.7 g/dL (ref 30.0–36.0)
RBC: 2.81 MIL/uL — ABNORMAL LOW (ref 3.87–5.11)

## 2012-08-20 NOTE — Discharge Summary (Signed)
Physician Discharge Summary  Kathleen Pope Kathleen Pope DOB: 09-Apr-1933 DOA: 08/17/2012  PCP: Kathleen Smiles., Pope GI: Kathleen Pope  Admit date: 08/17/2012 Discharge date: 08/20/2012  Recommendations for Outpatient Follow-up:  1. Follow-up CBC 2-5 days to follow-up anemia.  2. Avoid MRI x 1 month due to clips   Follow-up Information    Follow up with Kathleen Smiles., Pope. In 2 weeks.   Contact information:   [address] [place] [address] [phone]       Follow up with Kathleen Pope. On 09/01/2012. (as scheduled)    Contact information:   [address] [place] [address] [phone]         Discharge Diagnoses:  1. GIB 2. ABLA 3. History of cecal AVM  4. HTN 5. COPD 6. Cigarette smoker  Discharge Condition: improved Disposition: home  Diet recommendation: heart healthy  History of present illness:  78 year old woman presented to ED with rectal bleeding. PMH cecal AVM cauterized 9/26. Discharged 9/26. Admitted for recurrent GIB.  Hospital Course:  [name] was admitted for GIB, ABLA and seen by [facility]. She underwent colonoscopy (results below) with injection of vessel at cecum and clip placement. She had no further bleeding after this procedure, hemoglobin is stable and she has no pain. She was seen by Dr. Leone Pope today and cleared for discharge. She has follow-up with Dr. Marina Pope 10/18  1. GIB--secondary to cecal AVM. CBC essentially stable, no further bleeding since yesterday AM. No  NSAIDS/ASA/anticoagulants, avoid MRI x 1 month due to clips  2. ABLA--stable, repeat CBC next week at [facility].  3. History of cecal AVM  4. HTN--stable. Continue Norvasc.  5. COPD--stable.   6. Cigarette smoker--I recommended cessation to patient  Consultants:  [facility]  Procedures:  10/5 colonoscopy--ENDOSCOPIC IMPRESSION: 1. Small ulcer with non-bleeding visible vessel at the cecum; injection was given to prevent  more and control bleeding. 1:10,000 Epinephrine solution; 4 hemoclips placed also - 3 adherent. This ulcer is a post-treatment ulcer from previous APC ablation Another smaller cecal ulcer with clean-base also seen. 2. Mild diverticulosis was noted in the right colon 3. Small internal hemorrhoids 4. The colon mucosa was otherwise normal good prep   Discharge Instructions  Discharge Orders    Future Appointments: Provider: Department: Dept Phone: Center:   09/01/2012 10:45 AM Kathleen Pope [facility] Office [phone] [facility]     Future Orders Please Complete By Expires   Diet - low sodium heart healthy      Discharge instructions      Comments:   Keep follow-up appointment with Dr. Marina Pope. Kathleen Pope office will contact you this coming week to arrange for repeat blood count. No NSAIDS-- no aspirin, ibuprofen, naproxen, naprosyn, BC/Goody powders, Advil, Motrin or blood thinners. Call physician or seek immediate medical attention for bleeding, pain or worsening of condition.   Activity as tolerated - No restrictions          Medication List     As of 08/20/2012  1:06 PM    TAKE these medications         acetaminophen 500 MG tablet   Commonly known as: TYLENOL   Take 1,000 mg by mouth every 6 (six) hours as needed. Pain      amLODipine 5 MG tablet   Commonly known as: NORVASC   Take 5 mg by mouth daily.      calcium carbonate 600  MG Tabs   Commonly known as: OS-CAL   Take 600 mg by mouth daily.      ferrous sulfate 325 (65 FE) MG EC tablet   Take 325 mg by mouth 3 (three) times daily with meals.      fish oil-omega-3 fatty acids 1000 MG capsule   Take 1 g by mouth daily.      multivitamin tablet   Take 1 tablet by mouth daily.      pravastatin 10 MG tablet   Commonly known as: PRAVACHOL   Take 10 mg by mouth daily.        The results of significant diagnostics from this hospitalization (including imaging, microbiology, ancillary and laboratory) are listed  below for reference.    Labs: Basic Metabolic Panel:  Lab 08/17/12 4098  NA 138  K 4.1  CL 100  CO2 25  GLUCOSE 118*  BUN 21  CREATININE 0.76  CALCIUM 9.3  MG --  PHOS --   Liver Function Tests:  Lab 08/17/12 2330  AST 26  ALT 15  ALKPHOS 64  BILITOT 0.2*  PROT 6.5  ALBUMIN 3.7   CBC:  Lab 08/20/12 0427 08/19/12 0440 08/18/12 2100 08/18/12 1501 08/18/12 0824  WBC 7.0 7.1 8.6 7.6 7.3  NEUTROABS -- -- -- -- --  HGB 9.2* 9.7* 10.6* 10.2* 10.2*  HCT 26.5* 28.6* 30.6* 29.5* 29.6*  MCV 94.3 95.0 95.0 93.9 94.6  PLT 275 298 304 291 293    Principal Problem:  *GI hemorrhage from post-treatment cecal ulcer Active Problems:  Smoker  AVM (arteriovenous malformation) of colon with hemorrhage  HTN (hypertension)  Acute blood loss anemia   Time coordinating discharge: 20 minutes  Signed:  Brendia Sacks, Pope [facility] 08/20/2012, 1:06 PM

## 2012-08-20 NOTE — Progress Notes (Signed)
[facility] PROGRESS NOTE  Kathleen Pope:[id number] DOB: 08/01/1933 DOA: 08/17/2012 PCP: Cassell Smiles., MD GI: Yancey Flemings, MD  Assessment/Plan: 1. GIB--secondary to cecal AVM. CBC essentially stable, no further bleeding since yesterday AM. No  NSAIDS/ASA/anticoagulants, avoid MRI x 1 month due to clips 2. ABLA--stable, repeat CBC next week at [facility]. 3. History of cecal AVM 4. HTN--stable. Continue Norvasc. 5. COPD--stable.  6. Cigarette smoker--I recommended cessation to patient, patient declines nicotine patch  Code Status: Full code Family Communication: none present Disposition Plan: home this afternoon  Brendia Sacks, MD  [facility] Team 6 Pager [phone]. If 8PM-8AM, please contact night-coverage at [email or web address], password [facility] 08/20/2012, 12:54 PM  LOS: 3 days   Brief narrative: 78 year old woman presented to ED with rectal bleeding. PMH cecal AVM cauterized 9/26. Discharged 9/26. Admitted for recurrent GIB.  Consultants:  [facility]  Procedures:  10/5 colonoscopy--ENDOSCOPIC IMPRESSION: 1. Small ulcer with non-bleeding visible vesselat the cecum; injection was given to prevent more and control bleeding. 1:10,000 Epinephrine solution; 4 hemoclips placed also - 3 adherent. This ulcer is a post-treatment ulcer from previous APC ablation Another smaller cecal ulcer with clean-base also seen. 2. Mild diverticulosis was noted in the right colon 3. Small internal hemorrhoids 4. The colon mucosa was otherwise normal good prep   HPI/Subjective: No further bleeding, no complaints, no pain.   Objective: Filed Vitals:   08/19/12 1400 08/19/12 2210 08/20/12 0530 08/20/12 1031  BP: 120/70 101/65 94/55 126/73  Pulse: 78 80 79   Temp: 98 F (36.7 C) 99.3 F (37.4 C) 98.1 F (36.7 C)   TempSrc: Oral Oral Oral   Resp: 16 18 18    Height:      Weight:      SpO2: 96% 97% 100%     Intake/Output Summary (Last 24 hours) at 08/20/12 1254 Last data filed at 08/20/12  1058  Gross per 24 hour  Intake   1720 ml  Output   2550 ml  Net   -830 ml   Filed Weights   08/18/12 0255  Weight: 46.72 kg (103 lb)    Exam:  General:  Appears calm and comfortable. Cardiovascular: RRR, no m/r/g.  Respiratory: CTA bilaterally, no w/r/r. Normal respiratory effort. Psychiatric: grossly normal mood and affect, speech fluent and appropriate  Data Reviewed: Basic Metabolic Panel:  Lab 08/17/12 8295  NA 138  K 4.1  CL 100  CO2 25  GLUCOSE 118*  BUN 21  CREATININE 0.76  CALCIUM 9.3  MG --  PHOS --   Liver Function Tests:  Lab 08/17/12 2330  AST 26  ALT 15  ALKPHOS 64  BILITOT 0.2*  PROT 6.5  ALBUMIN 3.7   CBC:  Lab 08/20/12 0427 08/19/12 0440 08/18/12 2100 08/18/12 1501 08/18/12 0824  WBC 7.0 7.1 8.6 7.6 7.3  NEUTROABS -- -- -- -- --  HGB 9.2* 9.7* 10.6* 10.2* 10.2*  HCT 26.5* 28.6* 30.6* 29.5* 29.6*  MCV 94.3 95.0 95.0 93.9 94.6  PLT 275 298 304 291 293   Studies: No results found.  Scheduled Meds:    . amLODipine  5 mg Oral Daily  . metoCLOPramide (REGLAN) injection  10 mg Intravenous Once  . multivitamin with minerals  1 tablet Oral Daily  . omega-3 acid ethyl esters  1 g Oral Daily  . simvastatin  5 mg Oral q1800   Continuous Infusions:    . DISCONTD: sodium chloride 100 mL/hr at 08/19/12 1333    Principal Problem:  *GI hemorrhage  from post-treatment cecal ulcer Active Problems:  Smoker  AVM (arteriovenous malformation) of colon with hemorrhage  HTN (hypertension)  Acute blood loss anemia     Brendia Sacks, MD  [facility] Team 6 Pager [phone]. If 8PM-8AM, please contact night-coverage at [email or web address], password [facility] 08/20/2012, 12:54 PM  LOS: 3 days

## 2012-08-20 NOTE — Progress Notes (Signed)
     [facility] Daily Rounding Note 08/20/2012, 9:29 AM  SUBJECTIVE: No stools overnight No abdominal pain today + hungry OBJECTIVE: General: NAD  Vital signs in last 24 hours: Temp:  [97.8 F (36.6 C)-99.3 F (37.4 C)] 98.1 F (36.7 C) (10/06 0530) Pulse Rate:  [78-80] 79  (10/06 0530) Resp:  [16-18] 18  (10/06 0530) BP: (94-126)/(55-70) 94/55 mmHg (10/06 0530) SpO2:  [96 %-100 %] 100 % (10/06 0530) Last BM Date: 08/19/12  Abdomen: soft and nontedner Neuro/Psych:  appropriate  Intake/Output from previous day: 10/05 0701 - 10/06 0700 In: 1538.3 [P.O.:720; I.V.:818.3] Out: 2050 [Urine:2050]   Lab Results:  [facility] 08/20/12 0427 08/19/12 0440 08/18/12 2100  WBC 7.0 7.1 8.6  HGB 9.2* 9.7* 10.6*  HCT 26.5* 28.6* 30.6*  PLT 275 298 304      Studies/Results: Colonoscopy 10/5 1. Small ulcer with non-bleeding visible vesselat the cecum;  injection was given to prevent more and control bleeding. 1:10,000  Epinephrine solution; 4 hemoclips placed also - 3 adherent. This  ulcer is a post-treatment ulcer from previous APC ablation Another  smaller cecal ulcer with clean-base also seen.  2. Mild diverticulosis was noted in the right colon  3. Small internal hemorrhoids  4. The colon mucosa was otherwise normal good prep  ASSESMENT: 1) GI bleed from post-treatment cecal ulcer - appears to be resolved 2) Acute blood loss anemia - overall stable, slight drift   PLAN: 1) solid food, home later today (PM) or tomorrow as long as stable 2) She already has a 10/18 follow-up with Dr. Yancey Flemings 3) She should have a CBC next week - let me know w/ EMR communication if we need to do that in our office and we will call her   LOS: 3 days   Stan Head  08/20/2012, 9:29 AM  Pager [phone]

## 2012-08-21 ENCOUNTER — Encounter (HOSPITAL_COMMUNITY): Payer: Self-pay | Admitting: Internal Medicine

## 2012-08-22 ENCOUNTER — Telehealth: Payer: Self-pay

## 2012-08-22 DIAGNOSIS — D649 Anemia, unspecified: Secondary | ICD-10-CM

## 2012-08-22 NOTE — Telephone Encounter (Signed)
Message copied by Annett Fabian on Tue Aug 22, 2012  4:31 PM ------      Message from: Iva Boop      Created: Sun Aug 20, 2012  2:03 PM      Regarding: needs a CBC next week                    ----- Message -----         From: Standley Brooking, MD         Sent: 08/20/2012  12:56 PM           To: Iva Boop, MD            Baldo Ash,            Thanks for seeing her today. I'm planning discharge later today. Can your office contact her next week for follow-up CBC.            Thanks,            Jesusita Oka

## 2012-08-29 ENCOUNTER — Other Ambulatory Visit (INDEPENDENT_AMBULATORY_CARE_PROVIDER_SITE_OTHER): Payer: Medicare Other

## 2012-08-29 DIAGNOSIS — D649 Anemia, unspecified: Secondary | ICD-10-CM

## 2012-08-29 LAB — CBC WITH DIFFERENTIAL/PLATELET
Basophils Relative: 0.4 % (ref 0.0–3.0)
Eosinophils Relative: 2.6 % (ref 0.0–5.0)
MCV: 101.4 fl — ABNORMAL HIGH (ref 78.0–100.0)
Monocytes Absolute: 0.6 10*3/uL (ref 0.1–1.0)
Monocytes Relative: 8.3 % (ref 3.0–12.0)
Neutrophils Relative %: 61.8 % (ref 43.0–77.0)
RBC: 3.35 Mil/uL — ABNORMAL LOW (ref 3.87–5.11)
WBC: 7.6 10*3/uL (ref 4.5–10.5)

## 2012-08-29 NOTE — Progress Notes (Signed)
Quick Note:  Hgb much better She sees Dr. Marina Goodell 10/18 ______

## 2012-09-01 ENCOUNTER — Encounter: Payer: Self-pay | Admitting: Internal Medicine

## 2012-09-01 ENCOUNTER — Ambulatory Visit: Payer: Medicare Other | Admitting: Internal Medicine

## 2012-09-01 ENCOUNTER — Ambulatory Visit (INDEPENDENT_AMBULATORY_CARE_PROVIDER_SITE_OTHER): Payer: Medicare Other | Admitting: Internal Medicine

## 2012-09-01 VITALS — BP 118/70 | HR 80 | Ht 61.0 in | Wt 101.0 lb

## 2012-09-01 DIAGNOSIS — K5521 Angiodysplasia of colon with hemorrhage: Secondary | ICD-10-CM

## 2012-09-01 DIAGNOSIS — D62 Acute posthemorrhagic anemia: Secondary | ICD-10-CM

## 2012-09-01 DIAGNOSIS — Q2733 Arteriovenous malformation of digestive system vessel: Secondary | ICD-10-CM

## 2012-09-01 NOTE — Progress Notes (Signed)
HISTORY OF PRESENT ILLNESS:  Kathleen Pope is a 78 y.o. female with past medical history as listed below. She was recently hospitalized for subacute GI bleeding secondary to colonic AVMs that were treated endoscopically. She was readmitted to the hospital with recurrent bleeding. Repeat colonoscopy revealed ulceration in the area of previous therapy. Epinephrine injection and endoclips applied. She was subsequently discharged home in good condition on iron therapy 3 times daily. She presents today for schedule followup. Since her hospital discharge she reports no complaints. No further bleeding. Could energy levels. Recent hemoglobin improved to 11.4.  REVIEW OF SYSTEMS:  All non-GI ROS negative   Past Medical History  Diagnosis Date  . Chronic airway obstruction, not elsewhere classified   . Lower GI bleed multiple    AVM's  . HTN (hypertension)   . Hypercholesteremia   . Anemia   . COPD (chronic obstructive pulmonary disease)   . GI hemorrhage from post-treatment cecal ulcer 08/18/2012  . Diverticulosis   . Hemorrhoids     Past Surgical History  Procedure Date  . Colectomy   . Tonsillectomy   . Bunionectomy     left  . Colonoscopy 08/10/2012    Procedure: COLONOSCOPY;  Surgeon: Meryl Dare, MD,FACG;  Location: [facility];  Service: Endoscopy;  Laterality: N/A;  . Colonoscopy 08/19/2012    Procedure: COLONOSCOPY;  Surgeon: Iva Boop, MD;  Location: [facility];  Service: Endoscopy;  Laterality: N/A;    Social History Kathleen Pope  reports that she has been smoking Cigarettes.  She has a 47.2 pack-year smoking history. She has never used smokeless tobacco. She reports that she does not drink alcohol or use illicit drugs.  family history includes Anuerysm in her brother; Breast cancer in her mother and sister; and Lung cancer in her father.  No Known Allergies     PHYSICAL EXAMINATION: Vital signs: BP 118/70  Pulse 80  Ht 5\' 1"  (1.549 m)  Wt 101 lb (45.813 kg)   BMI 19.08 kg/m2 General: Well-developed, well-nourished, no acute distress Abdomen: not reexamined. Psychiatric: alert and oriented x3. Cooperative    ASSESSMENT:  #1. History of recurrent GI bleeding secondary to colonic AVMs. Recent hospitalization x2 as described. Currently asymptomatic on iron   PLAN:  #1. Return to iron twice daily as previous #2. Her PCP check CBC in one month. Thereafter as clinically indicated. GI followup as needed

## 2012-09-01 NOTE — Patient Instructions (Signed)
You have been given a separate informational sheet regarding your tobacco use, the importance of quitting and local resources to help you quit.  

## 2012-12-21 ENCOUNTER — Other Ambulatory Visit: Payer: Self-pay | Admitting: Physician Assistant

## 2013-03-08 ENCOUNTER — Encounter: Payer: Self-pay | Admitting: Nurse Practitioner

## 2013-03-08 ENCOUNTER — Telehealth: Payer: Self-pay

## 2013-03-08 ENCOUNTER — Ambulatory Visit (INDEPENDENT_AMBULATORY_CARE_PROVIDER_SITE_OTHER): Payer: Medicare Other | Admitting: Nurse Practitioner

## 2013-03-08 VITALS — BP 150/72 | HR 80 | Ht 61.0 in | Wt 105.5 lb

## 2013-03-08 DIAGNOSIS — K644 Residual hemorrhoidal skin tags: Secondary | ICD-10-CM | POA: Insufficient documentation

## 2013-03-08 DIAGNOSIS — K625 Hemorrhage of anus and rectum: Secondary | ICD-10-CM

## 2013-03-08 HISTORY — DX: Residual hemorrhoidal skin tags: K64.4

## 2013-03-08 MED ORDER — HYDROCORTISONE ACETATE 25 MG RE SUPP: RECTAL | Status: DC

## 2013-03-08 NOTE — Progress Notes (Addendum)
03/08/2013 Kathleen Pope [id number] 10-20-1933   History of Present Illness:  Patient is a 79 year old female known to Dr. 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 She has a remote history of a sigmoid colectomy for diverticular disease. She was last hospitalized for bleeding AVMs October of last year.   Patient is worked in today for evaluation of painless rectal bleeding. She had a BM this am with bright red blood. One hour later she had a bowel movement without any blood. She denies constipation or any other bowel changes. No associated abdominal pain, nausea or vomiting. She is not dizzy or short of breath. Patient does not take any blood thinners.  Current Medications, Allergies, Past Medical History, Past Surgical History, Family History and Social History were reviewed in [employer] record.   Physical Exam: General: Thin white female in no acute distress Head: Normocephalic and atraumatic Eyes:  sclerae anicteric, conjunctiva pink  Ears: Normal auditory acuity Lungs: Clear throughout to auscultation Heart: Regular rate and rhythm. Murmur present Abdomen: Soft, non tender and non distended. No masses, no hepatomegaly. Normal bowel sounds Rectal: smal external hemorrhoid Musculoskeletal: Symmetrical with no gross deformities  Extremities: No edema  Neurological: Alert oriented x 4, grossly nonfocal Psychological:  Alert and cooperative. Normal mood and affect  Assessment and Recommendations:  Painless rectal bleeding, isolated episode this am. There is a small external hemorrhoid present, no appreciable internal hemorrhoids on anoscopy. Will treat her with Anusol HC suppositories in case bleeding was in fact hemorrhoidal. She has a history of bleeding secondary to colonic AVMs .Recurrent AVMs not excluded but it is reassuring that the patient had a second bowel movement this morning without any blood. Patient knows if bleeding  increases/persists she will need to call us back ASAP.

## 2013-03-08 NOTE — Telephone Encounter (Signed)
Pt with h/o gi bleeds with hospitalization due to avm's. Pt states she was told to come in emergently if she ever had rectal bleeding. Pt showed up in the lobby stating she is having rectal bleeding. Pt scheduled to see Willette Cluster NP today at 11am. Pt aware.

## 2013-03-08 NOTE — Patient Instructions (Addendum)
You have been given a separate informational sheet regarding your tobacco use, the importance of quitting and local resources to help you quit. We have sent a prescription for suppositories to [facility].   Call us Monday the 28th, you can ask for Pam at [phone]. We want to know how you are doing regarding the bleeding.

## 2013-03-09 ENCOUNTER — Encounter (HOSPITAL_COMMUNITY): Payer: Self-pay | Admitting: Emergency Medicine

## 2013-03-09 ENCOUNTER — Encounter: Payer: Self-pay | Admitting: Nurse Practitioner

## 2013-03-09 ENCOUNTER — Inpatient Hospital Stay (HOSPITAL_COMMUNITY)
Admission: EM | Admit: 2013-03-09 | Discharge: 2013-03-11 | DRG: 393 | Disposition: A | Discharge status: Home / Self Care | Payer: Medicare Other | Attending: Internal Medicine | Admitting: Internal Medicine

## 2013-03-09 DIAGNOSIS — K648 Other hemorrhoids: Principal | ICD-10-CM | POA: Diagnosis present

## 2013-03-09 DIAGNOSIS — K625 Hemorrhage of anus and rectum: Secondary | ICD-10-CM

## 2013-03-09 DIAGNOSIS — J4489 Other specified chronic obstructive pulmonary disease: Secondary | ICD-10-CM | POA: Diagnosis present

## 2013-03-09 DIAGNOSIS — R0989 Other specified symptoms and signs involving the circulatory and respiratory systems: Secondary | ICD-10-CM

## 2013-03-09 DIAGNOSIS — J449 Chronic obstructive pulmonary disease, unspecified: Secondary | ICD-10-CM | POA: Diagnosis present

## 2013-03-09 DIAGNOSIS — K921 Melena: Secondary | ICD-10-CM

## 2013-03-09 DIAGNOSIS — K644 Residual hemorrhoidal skin tags: Secondary | ICD-10-CM

## 2013-03-09 DIAGNOSIS — Z79899 Other long term (current) drug therapy: Secondary | ICD-10-CM

## 2013-03-09 DIAGNOSIS — K922 Gastrointestinal hemorrhage, unspecified: Secondary | ICD-10-CM

## 2013-03-09 DIAGNOSIS — I10 Essential (primary) hypertension: Secondary | ICD-10-CM

## 2013-03-09 DIAGNOSIS — K5521 Angiodysplasia of colon with hemorrhage: Secondary | ICD-10-CM | POA: Diagnosis present

## 2013-03-09 DIAGNOSIS — E785 Hyperlipidemia, unspecified: Secondary | ICD-10-CM | POA: Diagnosis present

## 2013-03-09 DIAGNOSIS — E782 Mixed hyperlipidemia: Secondary | ICD-10-CM

## 2013-03-09 DIAGNOSIS — D62 Acute posthemorrhagic anemia: Secondary | ICD-10-CM

## 2013-03-09 DIAGNOSIS — E7800 Pure hypercholesterolemia, unspecified: Secondary | ICD-10-CM | POA: Diagnosis present

## 2013-03-09 DIAGNOSIS — K5520 Angiodysplasia of colon without hemorrhage: Secondary | ICD-10-CM

## 2013-03-09 DIAGNOSIS — R011 Cardiac murmur, unspecified: Secondary | ICD-10-CM

## 2013-03-09 DIAGNOSIS — Q2733 Arteriovenous malformation of digestive system vessel: Secondary | ICD-10-CM

## 2013-03-09 DIAGNOSIS — F17200 Nicotine dependence, unspecified, uncomplicated: Secondary | ICD-10-CM | POA: Diagnosis present

## 2013-03-09 LAB — OCCULT BLOOD, POC DEVICE: Fecal Occult Bld: POSITIVE — AB

## 2013-03-09 LAB — URINALYSIS, ROUTINE W REFLEX MICROSCOPIC
Glucose, UA: 500 mg/dL — AB
Hgb urine dipstick: NEGATIVE
Protein, ur: NEGATIVE mg/dL
Specific Gravity, Urine: 1.036 — ABNORMAL HIGH (ref 1.005–1.030)
pH: 6.5 (ref 5.0–8.0)

## 2013-03-09 LAB — COMPREHENSIVE METABOLIC PANEL
Alkaline Phosphatase: 74 U/L (ref 39–117)
BUN: 9 mg/dL (ref 6–23)
CO2: 27 mEq/L (ref 19–32)
Chloride: 102 mEq/L (ref 96–112)
Creatinine, Ser: 0.72 mg/dL (ref 0.50–1.10)
GFR calc non Af Amer: 80 mL/min — ABNORMAL LOW (ref >90)
Glucose, Bld: 124 mg/dL — ABNORMAL HIGH (ref 70–99)
Potassium: 3.5 mEq/L (ref 3.5–5.1)
Total Bilirubin: 0.3 mg/dL (ref 0.3–1.2)

## 2013-03-09 LAB — CBC
HCT: 40 % (ref 36.0–46.0)
Hemoglobin: 13.9 g/dL (ref 12.0–15.0)
MCV: 94.6 fL (ref 78.0–100.0)
RBC: 4.23 MIL/uL (ref 3.87–5.11)
WBC: 7.7 10*3/uL (ref 4.0–10.5)

## 2013-03-09 LAB — PROTIME-INR: Prothrombin Time: 12.1 seconds (ref 11.6–15.2)

## 2013-03-09 LAB — APTT: aPTT: 31 seconds (ref 24–37)

## 2013-03-09 MED ORDER — SODIUM CHLORIDE 0.9 % IV SOLN
INTRAVENOUS | Status: DC
  Administered 2013-03-09: 22:00:00 via INTRAVENOUS

## 2013-03-09 NOTE — Progress Notes (Signed)
Patient well known to me. Agree with initial assessment and plans 

## 2013-03-09 NOTE — ED Provider Notes (Signed)
History    CSN: [id number] Arrival date & time 03/09/13  1939 First MD Initiated Contact with Patient 03/09/13 2059    GI Dr Marina Goodell  Chief Complaint  Patient presents with  . Rectal Bleeding    HPI Patient presents to emergency room with complaints of rectal bleeding. Patient noticed the onset of the symptoms yesterday. She went to go to the bathroom and noticed blood mixed in the stool. She remained appointment and saw her gastroenterologist's office yesterday. He denied in and didn't see any obvious source for the bleeding although felt it was possible it could be related to hemorrhoids. Patient was discharged home and instructed to monitor for any recurrent episodes of bleeding. Patient had another episode of bleeding today. Again it was blood mixed with stool. She has not noticed any clots. She called her doctor was told to come to the emergency department. Patient denies any chest pain abdominal pain or shortness of breath. She does not feel lightheaded or weak.  She does have a history of prior intestinal bleeding that was ultimately diagnosed as an arteriovenous malformation. The last time she required any hospitalization was many years ago. Past Medical History  Diagnosis Date  . Chronic airway obstruction, not elsewhere classified   . Lower GI bleed multiple    AVM's  . HTN (hypertension)   . Hypercholesteremia   . Anemia   . COPD (chronic obstructive pulmonary disease)   . GI hemorrhage from post-treatment cecal ulcer 08/18/2012  . Diverticulosis   . Hemorrhoids     Past Surgical History  Procedure Laterality Date  . Colectomy    . Tonsillectomy    . Bunionectomy      left  . Colonoscopy  08/10/2012    Procedure: COLONOSCOPY;  Surgeon: Meryl Dare, MD,FACG;  Location: [facility];  Service: Endoscopy;  Laterality: N/A;  . Colonoscopy  08/19/2012    Procedure: COLONOSCOPY;  Surgeon: Iva Boop, MD;  Location: [facility];  Service: Endoscopy;  Laterality: N/A;     Family History  Problem Relation Age of Onset  . Breast cancer Mother   . Lung cancer Father   . Breast cancer Sister   . Anuerysm Brother     in stomach    History  Substance Use Topics  . Smoking status: Current Every Day Smoker -- 0.80 packs/day for 59 years    Types: Cigarettes  . Smokeless tobacco: Never Used  . Alcohol Use: No    OB History   Grav Para Term Preterm Abortions TAB SAB Ect Mult Living                  Review of Systems  All other systems reviewed and are negative.    Allergies  Review of patient's allergies indicates no known allergies.  Home Medications   Current Outpatient Rx  Name  Route  Sig  Dispense  Refill  . acetaminophen (TYLENOL) 500 MG tablet   Oral   Take 1,000 mg by mouth every 6 (six) hours as needed. Pain         . amLODipine (NORVASC) 5 MG tablet   Oral   Take 5 mg by mouth daily.         . calcium carbonate (OS-CAL) 600 MG TABS   Oral   Take 600 mg by mouth daily.         Marland Kitchen ECHINACEA PO   Oral   Take 1 capsule by mouth daily.         Marland Kitchen  ferrous sulfate 325 (65 FE) MG EC tablet   Oral   Take 325 mg by mouth 2 (two) times daily.          . fish oil-omega-3 fatty acids 1000 MG capsule   Oral   Take 1 g by mouth daily.         . hydrocortisone (ANUSOL-HC) 25 MG suppository      Use 1 suppository at bedtime.   7 suppository   1   . Multiple Vitamin (MULTIVITAMIN) tablet   Oral   Take 1 tablet by mouth daily.         . pravastatin (PRAVACHOL) 10 MG tablet   Oral   Take 10 mg by mouth daily.           BP 137/80  Pulse 83  Temp(Src) 98.6 F (37 C) (Oral)  Resp 18  Wt 105 lb (47.628 kg)  BMI 19.85 kg/m2  SpO2 97%  Physical Exam  Nursing note and vitals reviewed. Constitutional: She appears well-developed and well-nourished. No distress.  HENT:  Head: Normocephalic and atraumatic.  Right Ear: External ear normal.  Left Ear: External ear normal.  Eyes: Conjunctivae are normal.  Right eye exhibits no discharge. Left eye exhibits no discharge. No scleral icterus.  Neck: Neck supple. No tracheal deviation present.  Cardiovascular: Normal rate, regular rhythm and intact distal pulses.   Pulmonary/Chest: Effort normal and breath sounds normal. No stridor. No respiratory distress. She has no wheezes. She has no rales.  Abdominal: Soft. Bowel sounds are normal. She exhibits no distension. There is no tenderness. There is no rebound and no guarding.  Genitourinary:  Small amount of blood noted on DRE, no clots, small external non thrombosed hemorrhoid without bleeding  Musculoskeletal: She exhibits no edema and no tenderness.  Neurological: She is alert. She has normal strength. No sensory deficit. Cranial nerve deficit:  no gross defecits noted. She exhibits normal muscle tone. She displays no seizure activity. Coordination normal.  Skin: Skin is warm and dry. No rash noted.  Psychiatric: She has a normal mood and affect.    ED Course  Procedures (including critical care time)  Labs Reviewed  URINALYSIS, ROUTINE W REFLEX MICROSCOPIC - Abnormal; Notable for the following:    Specific Gravity, Urine 1.036 (*)    Glucose, UA 500 (*)    Ketones, ur 15 (*)    All other components within normal limits  COMPREHENSIVE METABOLIC PANEL - Abnormal; Notable for the following:    Glucose, Bld 124 (*)    GFR calc non Af Amer 80 (*)    All other components within normal limits  OCCULT BLOOD, POC DEVICE - Abnormal; Notable for the following:    Fecal Occult Bld POSITIVE (*)    All other components within normal limits  CBC  PROTIME-INR  APTT  TYPE AND SCREEN   No results found.   1. Rectal bleeding       MDM  The patient is fortunately hemodynamically stable.  She has a history of significant GI bleeding associated with arterial venous malformations. I have spoken with Dr. Marina Goodell, gastroenterology.  He recommends admission for observation. He will see the patient in the  morning.  I've spoken with Dr. Ardyth Harps and we will admit  the patient for observation.       Celene Kras, MD 03/09/13 [phone]

## 2013-03-09 NOTE — ED Notes (Signed)
Patient has history of weak blood vessels in her colon which causes bleeding in which she has to be hospitalized.  Patient has had area cauterized to prevent bleeding.  Patient denies pain, constipation, vomiting, or nausea.

## 2013-03-09 NOTE — H&P (Signed)
[facility]          History and Physical    PCP:   Cassell Smiles., MD   Chief Complaint:  Rectal bleeding  HPI: Patient is a pleasant 79 year old white woman with history of repeated episodes of GI bleed secondary to colonic AVMs. She had her first episode of bleeding this time around yesterday. She states she filled the toilet bowl of red blood. She went to see her gastroenterologist, Dr. Marina Goodell. Since she was stable she was allowed to go back home and advised to return to the emergency department with any further bleeding. She again had another episode tonight after she returned from dinner. She decided to come into the hospital. EDP has already consulted Dr. Marina Goodell who has recommended admission for observation given she has had pretty severe episodes of GI bleeding in the past. We have been asked to admit her for further evaluation and management.  Allergies:  No Known Allergies    Past Medical History  Diagnosis Date  . Chronic airway obstruction, not elsewhere classified   . Lower GI bleed multiple    AVM's  . HTN (hypertension)   . Hypercholesteremia   . Anemia   . COPD (chronic obstructive pulmonary disease)   . GI hemorrhage from post-treatment cecal ulcer 08/18/2012  . Diverticulosis   . Hemorrhoids     Past Surgical History  Procedure Laterality Date  . Colectomy    . Tonsillectomy    . Bunionectomy      left  . Colonoscopy  08/10/2012    Procedure: COLONOSCOPY;  Surgeon: Meryl Dare, MD,FACG;  Location: [facility];  Service: Endoscopy;  Laterality: N/A;  . Colonoscopy  08/19/2012    Procedure: COLONOSCOPY;  Surgeon: Iva Boop, MD;  Location: [facility];  Service: Endoscopy;  Laterality: N/A;    Prior to Admission medications   Medication Sig Start Date End Date Taking? Authorizing Provider  acetaminophen (TYLENOL) 500 MG tablet Take 1,000 mg by mouth every 6 (six) hours as needed. Pain   Yes Historical Provider, MD  amLODipine  (NORVASC) 5 MG tablet Take 5 mg by mouth daily.   Yes Historical Provider, MD  calcium carbonate (OS-CAL) 600 MG TABS Take 600 mg by mouth daily.   Yes Historical Provider, MD  ECHINACEA PO Take 1 capsule by mouth daily.   Yes Historical Provider, MD  ferrous sulfate 325 (65 FE) MG EC tablet Take 325 mg by mouth 2 (two) times daily.    Yes Historical Provider, MD  fish oil-omega-3 fatty acids 1000 MG capsule Take 1 g by mouth daily.   Yes Historical Provider, MD  hydrocortisone (ANUSOL-HC) 25 MG suppository Use 1 suppository at bedtime. 03/08/13  Yes Meredith Pel, NP  Multiple Vitamin (MULTIVITAMIN) tablet Take 1 tablet by mouth daily.   Yes Historical Provider, MD  pravastatin (PRAVACHOL) 10 MG tablet Take 10 mg by mouth daily.   Yes Historical Provider, MD    Social History:  reports that she has been smoking Cigarettes.  She has a 47.2 pack-year smoking history. She has never used smokeless tobacco. She reports that she does not drink alcohol or use illicit drugs.  Family History  Problem Relation Age of Onset  . Breast cancer Mother   . Lung cancer Father   . Breast cancer Sister   . Anuerysm Brother     in stomach    Review of Systems:  Constitutional: Denies fever, chills, diaphoresis, appetite change and fatigue.  HEENT: Denies photophobia,  eye pain, redness, hearing loss, ear pain, congestion, sore throat, rhinorrhea, sneezing, mouth sores, trouble swallowing, neck pain, neck stiffness and tinnitus.   Respiratory: Denies SOB, DOE, cough, chest tightness,  and wheezing.   Cardiovascular: Denies chest pain, palpitations and leg swelling.  Gastrointestinal: Denies nausea, vomiting, abdominal pain, diarrhea, constipation  and abdominal distention.  Genitourinary: Denies dysuria, urgency, frequency, hematuria, flank pain and difficulty urinating.  Musculoskeletal: Denies myalgias, back pain, joint swelling, arthralgias and gait problem.  Skin: Denies pallor, rash and wound.   Neurological: Denies dizziness, seizures, syncope, weakness, light-headedness, numbness and headaches.  Hematological: Denies adenopathy. Easy bruising, personal or family bleeding history  Psychiatric/Behavioral: Denies suicidal ideation, mood changes, confusion, nervousness, sleep disturbance and agitation   Physical Exam: Blood pressure 137/72, pulse 72, temperature 98.7 F (37.1 C), temperature source Oral, resp. rate 20, weight 47.628 kg (105 lb), SpO2 96.00%. General: Alert, awake, oriented x3, in no acute distress, pleasant and cooperative with exam. HEENT: Normocephalic, atraumatic, pupils equal round and reactive to light, extraocular movements intact, moist mucous membranes. Neck: Supple, no JVD, no lymphadenopathy, no bruits, no goiter. Cardiovascular: Regular rate and rhythm, prominent systolic ejection murmur loudest at the aortic area. Lungs: Clear to auscultation bilaterally. Abdomen: Soft, nontender, nondistended, positive bowel sounds, no masses or organomegaly noted. Extremities: No clubbing, cyanosis or edema, positive pedal pulses. Neurologic: Grossly intact and nonfocal. I have not ambulated her.  Labs on Admission:  Results for orders placed during the hospital encounter of 03/09/13 (from the past 48 hour(s))  URINALYSIS, ROUTINE W REFLEX MICROSCOPIC     Status: Abnormal   Collection Time    03/09/13  8:28 PM      Result Value Range   Color, Urine YELLOW  YELLOW   APPearance CLEAR  CLEAR   Specific Gravity, Urine 1.036 (*) 1.005 - 1.030   pH 6.5  5.0 - 8.0   Glucose, UA 500 (*) NEGATIVE mg/dL   Hgb urine dipstick NEGATIVE  NEGATIVE   Bilirubin Urine NEGATIVE  NEGATIVE   Ketones, ur 15 (*) NEGATIVE mg/dL   Protein, ur NEGATIVE  NEGATIVE mg/dL   Urobilinogen, UA 1.0  0.0 - 1.0 mg/dL   Nitrite NEGATIVE  NEGATIVE   Leukocytes, UA NEGATIVE  NEGATIVE   Comment: MICROSCOPIC NOT DONE ON URINES WITH NEGATIVE PROTEIN, BLOOD, LEUKOCYTES, NITRITE, OR GLUCOSE <1000  mg/dL.  CBC     Status: None   Collection Time    03/09/13  9:10 PM      Result Value Range   WBC 7.7  4.0 - 10.5 K/uL   RBC 4.23  3.87 - 5.11 MIL/uL   Hemoglobin 13.9  12.0 - 15.0 g/dL   HCT 47.8  29.5 - 62.1 %   MCV 94.6  78.0 - 100.0 fL   MCH 32.9  26.0 - 34.0 pg   MCHC 34.8  30.0 - 36.0 g/dL   RDW 30.8  65.7 - 84.6 %   Platelets 284  150 - 400 K/uL  COMPREHENSIVE METABOLIC PANEL     Status: Abnormal   Collection Time    03/09/13  9:10 PM      Result Value Range   Sodium 140  135 - 145 mEq/L   Potassium 3.5  3.5 - 5.1 mEq/L   Chloride 102  96 - 112 mEq/L   CO2 27  19 - 32 mEq/L   Glucose, Bld 124 (*) 70 - 99 mg/dL   BUN 9  6 - 23 mg/dL   Creatinine, Ser  0.72  0.50 - 1.10 mg/dL   Calcium 9.6  8.4 - 40.9 mg/dL   Total Protein 6.9  6.0 - 8.3 g/dL   Albumin 3.8  3.5 - 5.2 g/dL   AST 28  0 - 37 U/L   ALT 17  0 - 35 U/L   Alkaline Phosphatase 74  39 - 117 U/L   Total Bilirubin 0.3  0.3 - 1.2 mg/dL   GFR calc non Af Amer 80 (*) >90 mL/min   GFR calc Af Amer >90  >90 mL/min   Comment:            The eGFR has been calculated     using the CKD EPI equation.     This calculation has not been     validated in all clinical     situations.     eGFR's persistently     <90 mL/min signify     possible Chronic Kidney Disease.  TYPE AND SCREEN     Status: None   Collection Time    03/09/13  9:10 PM      Result Value Range   ABO/RH(D) B POS     Antibody Screen NEG     Sample Expiration 03/12/2013    PROTIME-INR     Status: None   Collection Time    03/09/13  9:10 PM      Result Value Range   Prothrombin Time 12.1  11.6 - 15.2 seconds   INR 0.90  0.00 - 1.49  APTT     Status: None   Collection Time    03/09/13  9:10 PM      Result Value Range   aPTT 31  24 - 37 seconds  OCCULT BLOOD, POC DEVICE     Status: Abnormal   Collection Time    03/09/13  9:27 PM      Result Value Range   Fecal Occult Bld POSITIVE (*) NEGATIVE    Radiological Exams on Admission: No results  found.  Assessment/Plan Principal Problem:   Hematochezia Active Problems:   AVM (arteriovenous malformation) of colon with hemorrhage   Lower GI bleed -Likely from her history of AVMs versus hemorrhoids. -She is hemodynamically stable and hemoglobin is 13.9. Because of this I will elect admit her to a telemetry floor. -2 units of PRBCs have been placed on hold but no transfusion has been ordered at the moment. -We'll recheck CBC in the morning. -EDP has consulted Dr. Marina Goodell with [facility] and they will see her in the morning.  DVT prophylaxis -SCDs.   Time Spent on Admission: 70 minutes  HERNANDEZ ACOSTA,ESTELA [facility] Pager: [phone] 03/09/2013, 11:59 PM

## 2013-03-09 NOTE — ED Notes (Signed)
Pt states she went to bathroom yesterday morning and noticed bright red blood in stool. She has a history of rectal bleeding since 2001. She did see Yancey Flemings her gastro MD, and he instructed her to come to the ED

## 2013-03-10 ENCOUNTER — Encounter (HOSPITAL_COMMUNITY): Payer: Self-pay | Admitting: *Deleted

## 2013-03-10 DIAGNOSIS — R0989 Other specified symptoms and signs involving the circulatory and respiratory systems: Secondary | ICD-10-CM

## 2013-03-10 DIAGNOSIS — K921 Melena: Secondary | ICD-10-CM

## 2013-03-10 DIAGNOSIS — D62 Acute posthemorrhagic anemia: Secondary | ICD-10-CM

## 2013-03-10 LAB — CBC
Hemoglobin: 12.7 g/dL (ref 12.0–15.0)
MCHC: 34 g/dL (ref 30.0–36.0)
Platelets: 250 10*3/uL (ref 150–400)
RDW: 14.6 % (ref 11.5–15.5)

## 2013-03-10 LAB — HEMOGLOBIN AND HEMATOCRIT, BLOOD
HCT: 39.5 % (ref 36.0–46.0)
Hemoglobin: 13.5 g/dL (ref 12.0–15.0)

## 2013-03-10 LAB — BASIC METABOLIC PANEL
BUN: 6 mg/dL (ref 6–23)
Calcium: 8.7 mg/dL (ref 8.4–10.5)
GFR calc Af Amer: >90 mL/min (ref >90)
GFR calc non Af Amer: 83 mL/min — ABNORMAL LOW (ref >90)
Potassium: 3.6 mEq/L (ref 3.5–5.1)

## 2013-03-10 MED ORDER — SODIUM CHLORIDE 0.9 % IJ SOLN
3.0000 mL | Freq: Two times a day (BID) | INTRAMUSCULAR | Status: DC
  Administered 2013-03-10: 3 mL via INTRAVENOUS

## 2013-03-10 MED ORDER — ONDANSETRON HCL 4 MG PO TABS: 4.0000 mg | ORAL_TABLET | Freq: Four times a day (QID) | ORAL | Status: DC | PRN

## 2013-03-10 MED ORDER — SODIUM CHLORIDE 0.9 % IV SOLN: INTRAVENOUS | Status: AC

## 2013-03-10 MED ORDER — PEG-KCL-NACL-NASULF-NA ASC-C 100 G PO SOLR: 1.0000 | Freq: Once | ORAL | Status: DC

## 2013-03-10 MED ORDER — PEG-KCL-NACL-NASULF-NA ASC-C 100 G PO SOLR
0.5000 | Freq: Once | ORAL | Status: AC
  Administered 2013-03-11: 50 g via ORAL

## 2013-03-10 MED ORDER — CARVEDILOL 3.125 MG PO TABS
3.1250 mg | ORAL_TABLET | Freq: Two times a day (BID) | ORAL | Status: DC
  Administered 2013-03-10 – 2013-03-11 (×2): 3.125 mg via ORAL
  Filled 2013-03-10 (×4): qty 1

## 2013-03-10 MED ORDER — ACETAMINOPHEN 325 MG PO TABS: 650.0000 mg | ORAL_TABLET | Freq: Four times a day (QID) | ORAL | Status: DC | PRN

## 2013-03-10 MED ORDER — PEG-KCL-NACL-NASULF-NA ASC-C 100 G PO SOLR
0.5000 | Freq: Once | ORAL | Status: AC
  Administered 2013-03-10: 50 g via ORAL
  Filled 2013-03-10: qty 1

## 2013-03-10 MED ORDER — PANTOPRAZOLE SODIUM 40 MG PO TBEC
40.0000 mg | DELAYED_RELEASE_TABLET | Freq: Every day | ORAL | Status: DC
  Administered 2013-03-10 – 2013-03-11 (×2): 40 mg via ORAL
  Filled 2013-03-10 (×2): qty 1

## 2013-03-10 MED ORDER — OMEGA-3 FATTY ACIDS 1000 MG PO CAPS: 1.0000 g | ORAL_CAPSULE | Freq: Every day | ORAL | Status: DC

## 2013-03-10 MED ORDER — ACETAMINOPHEN 650 MG RE SUPP: 650.0000 mg | Freq: Four times a day (QID) | RECTAL | Status: DC | PRN

## 2013-03-10 MED ORDER — AMLODIPINE BESYLATE 5 MG PO TABS
5.0000 mg | ORAL_TABLET | Freq: Every day | ORAL | Status: DC
  Administered 2013-03-10 – 2013-03-11 (×2): 5 mg via ORAL
  Filled 2013-03-10 (×2): qty 1

## 2013-03-10 MED ORDER — SODIUM CHLORIDE 0.9 % IV SOLN
INTRAVENOUS | Status: DC
  Administered 2013-03-10: 07:00:00 via INTRAVENOUS

## 2013-03-10 MED ORDER — ONDANSETRON HCL 4 MG/2ML IJ SOLN: 4.0000 mg | Freq: Four times a day (QID) | INTRAMUSCULAR | Status: DC | PRN

## 2013-03-10 MED ORDER — OMEGA-3-ACID ETHYL ESTERS 1 G PO CAPS
1.0000 g | ORAL_CAPSULE | Freq: Every day | ORAL | Status: DC
  Administered 2013-03-10 – 2013-03-11 (×2): 1 g via ORAL
  Filled 2013-03-10 (×2): qty 1

## 2013-03-10 MED ORDER — FERROUS SULFATE 325 (65 FE) MG PO TABS
325.0000 mg | ORAL_TABLET | Freq: Two times a day (BID) | ORAL | Status: DC
  Administered 2013-03-10 – 2013-03-11 (×3): 325 mg via ORAL
  Filled 2013-03-10 (×5): qty 1

## 2013-03-10 MED ORDER — CALCIUM CARBONATE 1250 (500 CA) MG PO TABS
1250.0000 mg | ORAL_TABLET | Freq: Every day | ORAL | Status: DC
Start: 2013-03-10 — End: 2013-03-11
  Administered 2013-03-10 – 2013-03-11 (×2): 1250 mg via ORAL
  Filled 2013-03-10 (×4): qty 1

## 2013-03-10 MED ORDER — ADULT MULTIVITAMIN W/MINERALS CH
1.0000 | ORAL_TABLET | Freq: Every day | ORAL | Status: DC
  Administered 2013-03-10 – 2013-03-11 (×2): 1 via ORAL
  Filled 2013-03-10 (×2): qty 1

## 2013-03-10 MED ORDER — OXYCODONE HCL 5 MG PO TABS: 5.0000 mg | ORAL_TABLET | ORAL | Status: DC | PRN

## 2013-03-10 MED ORDER — SENNOSIDES-DOCUSATE SODIUM 8.6-50 MG PO TABS
1.0000 | ORAL_TABLET | Freq: Every evening | ORAL | Status: DC | PRN
  Filled 2013-03-10: qty 1

## 2013-03-10 MED ORDER — SIMVASTATIN 5 MG PO TABS
5.0000 mg | ORAL_TABLET | Freq: Every day | ORAL | Status: DC
  Administered 2013-03-10: 5 mg via ORAL
  Filled 2013-03-10 (×2): qty 1

## 2013-03-10 NOTE — Consult Note (Signed)
Referring Provider: [facility] Primary Care Physician:  Cassell Smiles., MD Primary Gastroenterologist:  Dr.Rowen Hur Marina Goodell  Reason for Consultation:  Rectal bleeding  HPI: Kathleen Pope is a 79 y.o. female known to Dr. Marina Goodell from prior GI evaluation . She has history of colonic AVMs and prior GI bleeding. Actually is status post remote sigmoid colectomy for diverticular disease. She had a GI bleed in September of 2013 and was found to have 2 small cecal AVM's which were treated with APC. Unfortunately she developed recurrent bleeding in October of 2013 and was found to have a small ulcer at the prior APC site .  She had done well until recently and started noticing bright red blood with a bowel movement 2-3 days ago. She was seen in the office by Willette Cluster nurse practitioner on 03/08/2013 and at that time bleeding was thought possibly secondary to internal hemorrhoids and she was treated with suppositories. Her hemoglobin was stable. Unfortunately she continued to pass bright red blood and says that she passed a large amount yesterday with the bowel movement although being bright red blood and then had dripping of bright red blood in the commode. She was concerned that she was bleeding from AVMs again and came onto the emergency room. Since admitted she has been very stable and has actually not had any further active bleeding. She says she did have a small bowel movement this morning with no obvious blood. Her hemoglobin has remained about 13  She's not on any aspirin or NSAIDs and denies any abdominal pain cramping etc. with these episodes.   Past Medical History  Diagnosis Date  . Chronic airway obstruction, not elsewhere classified   . Lower GI bleed multiple    AVM's  . HTN (hypertension)   . Hypercholesteremia   . Anemia   . COPD (chronic obstructive pulmonary disease)   . GI hemorrhage from post-treatment cecal ulcer 08/18/2012  . Diverticulosis   . Hemorrhoids     Past  Surgical History  Procedure Laterality Date  . Colectomy    . Tonsillectomy    . Bunionectomy      left  . Colonoscopy  08/10/2012    Procedure: COLONOSCOPY;  Surgeon: Meryl Dare, MD,FACG;  Location: [facility];  Service: Endoscopy;  Laterality: N/A;  . Colonoscopy  08/19/2012    Procedure: COLONOSCOPY;  Surgeon: Iva Boop, MD;  Location: [facility];  Service: Endoscopy;  Laterality: N/A;    Prior to Admission medications   Medication Sig Start Date End Date Taking? Authorizing Provider  acetaminophen (TYLENOL) 500 MG tablet Take 1,000 mg by mouth every 6 (six) hours as needed. Pain   Yes Historical Provider, MD  amLODipine (NORVASC) 5 MG tablet Take 5 mg by mouth daily.   Yes Historical Provider, MD  calcium carbonate (OS-CAL) 600 MG TABS Take 600 mg by mouth daily.   Yes Historical Provider, MD  ECHINACEA PO Take 1 capsule by mouth daily.   Yes Historical Provider, MD  ferrous sulfate 325 (65 FE) MG EC tablet Take 325 mg by mouth 2 (two) times daily.    Yes Historical Provider, MD  fish oil-omega-3 fatty acids 1000 MG capsule Take 1 g by mouth daily.   Yes Historical Provider, MD  hydrocortisone (ANUSOL-HC) 25 MG suppository Use 1 suppository at bedtime. 03/08/13  Yes Meredith Pel, NP  Multiple Vitamin (MULTIVITAMIN) tablet Take 1 tablet by mouth daily.   Yes Historical Provider, MD  pravastatin (PRAVACHOL) 10 MG tablet Take 10  mg by mouth daily.   Yes Historical Provider, MD    Current Facility-Administered Medications  Medication Dose Route Frequency Provider Last Rate Last Dose  . 0.9 %  sodium chloride infusion   Intravenous Continuous Leroy Sea, MD 35 mL/hr at 03/10/13 0845    . acetaminophen (TYLENOL) tablet 650 mg  650 mg Oral Q6H PRN Henderson Cloud, MD       Or  . acetaminophen (TYLENOL) suppository 650 mg  650 mg Rectal Q6H PRN Henderson Cloud, MD      . amLODipine (NORVASC) tablet 5 mg  5 mg Oral Daily Estela Isaiah Blakes, MD    5 mg at 03/10/13 0935  . calcium carbonate (OS-CAL - dosed in mg of elemental calcium) tablet 1,250 mg  1,250 mg Oral Q breakfast Henderson Cloud, MD   1,250 mg at 03/10/13 0806  . carvedilol (COREG) tablet 3.125 mg  3.125 mg Oral BID WC Leroy Sea, MD      . ferrous sulfate tablet 325 mg  325 mg Oral BID PC Estela Isaiah Blakes, MD   325 mg at 03/10/13 0806  . multivitamin with minerals tablet 1 tablet  1 tablet Oral Daily Henderson Cloud, MD   1 tablet at 03/10/13 0935  . omega-3 acid ethyl esters (LOVAZA) capsule 1 g  1 g Oral Daily Meredeth Ide, MD   1 g at 03/10/13 0935  . ondansetron (ZOFRAN) tablet 4 mg  4 mg Oral Q6H PRN Henderson Cloud, MD       Or  . ondansetron [facility]) injection 4 mg  4 mg Intravenous Q6H PRN Henderson Cloud, MD      . oxyCODONE (Oxy IR/ROXICODONE) immediate release tablet 5 mg  5 mg Oral Q4H PRN Henderson Cloud, MD      . pantoprazole (PROTONIX) EC tablet 40 mg  40 mg Oral Daily Leroy Sea, MD   40 mg at 03/10/13 1333  . senna-docusate (Senokot-S) tablet 1 tablet  1 tablet Oral QHS PRN Henderson Cloud, MD      . simvastatin (ZOCOR) tablet 5 mg  5 mg Oral q1800 Henderson Cloud, MD      . sodium chloride 0.9 % injection 3 mL  3 mL Intravenous Q12H Henderson Cloud, MD        Allergies as of 03/09/2013  . (No Known Allergies)    Family History  Problem Relation Age of Onset  . Breast cancer Mother   . Lung cancer Father   . Breast cancer Sister   . Anuerysm Brother     in stomach    History   Social History  . Marital Status: Widowed    Spouse Name: N/A    Number of Children: 1  . Years of Education: N/A   Occupational History  . retired    Social History Main Topics  . Smoking status: Current Every Day Smoker -- 0.80 packs/day for 59 years    Types: Cigarettes  . Smokeless tobacco: Never Used  . Alcohol Use: No  . Drug Use: No  . Sexually Active:  No   Other Topics Concern  . Not on file   Social History Narrative  . No narrative on file    Review of Systems: Pertinent positive and negative review of systems were noted in the above HPI section.  All other review of systems was otherwise  negative.   Physical Exam: Vital signs in last 24 hours: Temp:  [97.7 F (36.5 C)-98.7 F (37.1 C)] 97.7 F (36.5 C) (04/26 0130) Pulse Rate:  [64-83] 64 (04/26 0130) Resp:  [14-20] 14 (04/26 0130) BP: (137-167)/(72-80) 167/76 mmHg (04/26 0130) SpO2:  [96 %-99 %] 99 % (04/26 0130) Weight:  [103 lb 8 oz (46.947 kg)-105 lb (47.628 kg)] 103 lb 8 oz (46.947 kg) (04/26 0130) Last BM Date: 03/09/13 General:   Alert,  Well-developed, well-nourished,thin elderly WF pleasant and cooperative in NAD Head:  Normocephalic and atraumatic. Eyes:  Sclera clear, no icterus.   Conjunctiva pink. Ears:  Normal auditory acuity. Nose:  No deformity, discharge,  or lesions. Mouth:  No deformity or lesions.   Neck:  Supple; no masses or thyromegaly. Lungs:  Clear throughout to auscultation.   No wheezes, crackles, or rhonchi. Heart:  Regular rate and rhythm; soft murmur Abdomen:  Soft,nontender, BS active,nonpalp mass or hsm.   Rectal:  Deferred  Msk:  Symmetrical without gross deformities. . Pulses:  Normal pulses noted. Extremities:  Without clubbing or edema. Neurologic:  Alert and  oriented x4;  grossly normal neurologically. Skin:  Intact without significant lesions or rashes.. Psych:  Alert and cooperative. Normal mood and affect.  Intake/Output from previous day: 04/25 0701 - 04/26 0700 In: 720 [P.O.:120; I.V.:600] Out: 950 [Urine:950] Intake/Output this shift:    Lab Results:  Recent Labs  03/09/13 2110 03/10/13 0535 03/10/13 1250  WBC 7.7 6.2  --   HGB 13.9 12.7 13.5  HCT 40.0 37.4 39.5  PLT 284 250  --    BMET  Recent Labs  03/09/13 2110 03/10/13 0535  NA 140 143  K 3.5 3.6  CL 102 107  CO2 27 28  GLUCOSE 124* 86  BUN 9  6  CREATININE 0.72 0.64  CALCIUM 9.6 8.7   LFT  Recent Labs  03/09/13 2110  PROT 6.9  ALBUMIN 3.8  AST 28  ALT 17  ALKPHOS 74  BILITOT 0.3   PT/INR  Recent Labs  03/09/13 2110  LABPROT 12.1  INR 0.90    IMPRESSION:  #1 79 yo female with persistent low-grade rectal bleeding and prior history of 2 admissions for acute hemorrhage secondary to colonic AVMs. Her bleeding may in fact be local anal rectal, however cannot rule out bleeding secondary to recurrent AVMs  #2 history of diverticulosis status post sigmoid colectomy #3 hypertension #4 smoker   PLAN: Will schedule for colonoscopy with Dr. Marina Goodell tomorrow morning and plan bowel prep later in the day today. Procedure was discussed in detail with the patient and she is agreeable to proceed Continue serial hemoglobins Further plans based on colonoscopy findings   Amy Esterwood  03/10/2013, 1:48 PM     GI ATTENDING  History, laboratories, prior endoscopy reports reviewed. Patient seen and examined. Agree with history, physical, assessment, and plans as outlined above.Kathleen Pope is 79 years old and well known to me. Prior history of recurrent lower GI bleeding secondary to AVMs. Also status post sigmoid colectomy for suspected diverticular bleeding which was subsequently proven to be AVMs. She has had low-grade bleeding recently. This has made her anxious. She was seen in the office as noted. Hemoglobin have been stable. Hemodynamically stable. We will plan colonoscopy tomorrow to further assess the nature of her bleeding. I have discussed this with her.The nature of the procedure, as well as the risks, benefits, and alternatives were carefully and thoroughly reviewed with the patient. Ample time for discussion  and questions allowed. The patient understood, was satisfied, and agreed to proceed.  Wilhemina Bonito. Eda Keys., M.D. [facility] Division of Gastroenterology

## 2013-03-10 NOTE — Progress Notes (Signed)
[facility]                                                                                Patient Demographics  Kathleen Pope, is a 79 y.o. female, DOB - June 21, 1933, WUJ:[id number], NFA:[id number]  Admit date - 03/09/2013  Admitting Physician Kathleen Cloud, MD  Outpatient Primary MD for the patient is Kathleen Smiles., MD  LOS - 1   Chief Complaint  Patient presents with  . Rectal Bleeding        Assessment & Plan    1. Hematochezia with H/O AVM (arteriovenous malformation) of colon with hemorrhage - no further bleeds, H&H is stable, she has no abdominal cramping or pain, await GI to see and recommend, continue monitoring H&H type screen has been done. Will add PPI.    2. Dyslipidemia home dose statin.    3. Hypertension on Norvasc, added Coreg for better control.      Code Status: Full  Family Communication:  None present  Disposition Plan: home   Procedures     Consults  GI   DVT Prophylaxis    SCDs   Lab Results  Component Value Date   PLT 250 03/10/2013    Medications  Scheduled Meds: . amLODipine  5 mg Oral Daily  . calcium carbonate  1,250 mg Oral Q breakfast  . ferrous sulfate  325 mg Oral BID PC  . multivitamin with minerals  1 tablet Oral Daily  . omega-3 acid ethyl esters  1 g Oral Daily  . simvastatin  5 mg Oral q1800  . sodium chloride  3 mL Intravenous Q12H   Continuous Infusions: . sodium chloride     PRN Meds:.acetaminophen, acetaminophen, ondansetron (ZOFRAN) IV, ondansetron, oxyCODONE, senna-docusate  Antibiotics     Anti-infectives   None       Time Spent in minutes  35   Kathleen Pope on 03/10/2013 at 10:08 AM  Between 7am to 7pm - Pager - [phone]  After 7pm go to [email or web address] - password [email or web address]  And look for the night coverage person covering for me after hours  [facility] Office  [phone]    Subjective:   Kathleen Pope today has, No headache, No chest pain, No  abdominal pain - No Nausea, No new weakness tingling or numbness, No Cough - SOB.    Objective:   Filed Vitals:   03/09/13 1947 03/09/13 2342 03/10/13 0130  BP: 137/80 137/72 167/76  Pulse: 83 72 64  Temp: 98.6 F (37 C) 98.7 F (37.1 C) 97.7 F (36.5 C)  TempSrc: Oral Oral Oral  Resp: 18 20 14   Height:   5\' 1"  (1.549 m)  Weight: 47.628 kg (105 lb)  46.947 kg (103 lb 8 oz)  SpO2: 97% 96% 99%    Wt Readings from Last 3 Encounters:  03/10/13 46.947 kg (103 lb 8 oz)  03/08/13 47.854 kg (105 lb 8 oz)  09/01/12 45.813 kg (101 lb)     Intake/Output Summary (Last 24 hours) at 03/10/13 1008 Last data filed at 03/10/13 0641  Gross per 24 hour  Intake    720 ml  Output    950 ml  Net   -  230 ml    Exam Awake Alert, Oriented X 3, No new F.N deficits, Normal affect [place].AT,PERRAL Supple Neck,No JVD, No cervical lymphadenopathy appriciated.  Symmetrical Chest wall movement, Good air movement bilaterally, CTAB RRR,No Gallops,Rubs or new Murmurs, No Parasternal Heave +ve B.Sounds, Abd Soft, Non tender, No organomegaly appriciated, No rebound - guarding or rigidity. No Cyanosis, Clubbing or edema, No new Rash or bruise      Data Review   Micro Results No results found for this or any previous visit (from the past 240 hour(s)).  Radiology Reports No results found.  CBC  Recent Labs Lab 03/09/13 2110 03/10/13 0535  WBC 7.7 6.2  HGB 13.9 12.7  HCT 40.0 37.4  PLT 284 250  MCV 94.6 95.7  MCH 32.9 32.5  MCHC 34.8 34.0  RDW 14.6 14.6    Chemistries   Recent Labs Lab 03/09/13 2110 03/10/13 0535  NA 140 143  K 3.5 3.6  CL 102 107  CO2 27 28  GLUCOSE 124* 86  BUN 9 6  CREATININE 0.72 0.64  CALCIUM 9.6 8.7  AST 28  --   ALT 17  --   ALKPHOS 74  --   BILITOT 0.3  --    ------------------------------------------------------------------------------------------------------------------ estimated creatinine clearance is 42.9 ml/min (by C-G formula based on Cr of  0.64). ------------------------------------------------------------------------------------------------------------------ No results found for this basename: HGBA1C,  in the last 72 hours ------------------------------------------------------------------------------------------------------------------ No results found for this basename: CHOL, HDL, LDLCALC, TRIG, CHOLHDL, LDLDIRECT,  in the last 72 hours ------------------------------------------------------------------------------------------------------------------ No results found for this basename: TSH, T4TOTAL, FREET3, T3FREE, THYROIDAB,  in the last 72 hours ------------------------------------------------------------------------------------------------------------------ No results found for this basename: VITAMINB12, FOLATE, FERRITIN, TIBC, IRON, RETICCTPCT,  in the last 72 hours  Coagulation profile  Recent Labs Lab 03/09/13 2110  INR 0.90    No results found for this basename: DDIMER,  in the last 72 hours  Cardiac Enzymes No results found for this basename: CK, CKMB, TROPONINI, MYOGLOBIN,  in the last 168 hours ------------------------------------------------------------------------------------------------------------------ No components found with this basename: POCBNP,

## 2013-03-11 ENCOUNTER — Encounter (HOSPITAL_COMMUNITY)
Admission: EM | Disposition: A | Discharge status: Home / Self Care | Payer: Self-pay | Source: Home / Self Care | Attending: Internal Medicine

## 2013-03-11 ENCOUNTER — Encounter (HOSPITAL_COMMUNITY): Payer: Self-pay | Admitting: *Deleted

## 2013-03-11 HISTORY — PX: COLONOSCOPY: SHX5424

## 2013-03-11 SURGERY — COLONOSCOPY
Anesthesia: Moderate Sedation

## 2013-03-11 MED ORDER — SODIUM CHLORIDE 0.9 % IV SOLN
INTRAVENOUS | Status: DC
  Administered 2013-03-11: 500 mL via INTRAVENOUS

## 2013-03-11 MED ORDER — MIDAZOLAM HCL 5 MG/5ML IJ SOLN
INTRAMUSCULAR | Status: DC | PRN
  Administered 2013-03-11: 1 mg via INTRAVENOUS
  Administered 2013-03-11 (×2): 2 mg via INTRAVENOUS

## 2013-03-11 MED ORDER — PANTOPRAZOLE SODIUM 40 MG PO TBEC: 40.0000 mg | DELAYED_RELEASE_TABLET | Freq: Every day | ORAL | Status: DC

## 2013-03-11 MED ORDER — FENTANYL CITRATE 0.05 MG/ML IJ SOLN
INTRAMUSCULAR | Status: DC | PRN
  Administered 2013-03-11 (×2): 25 ug via INTRAVENOUS

## 2013-03-11 NOTE — Progress Notes (Signed)
Patient ID: Kathleen Pope, female   DOB: [birth year], 79 y.o.   MRN: [id number] [facility] Progress Note  Subjective: Tolerated prep without difficulty, no active bleeding- hoping for discharge later today. HGB 13.8-stable  Objective:  Vital signs in last 24 hours: Temp:  [97.7 F (36.5 C)-97.8 F (36.6 C)] 97.7 F (36.5 C) (04/27 0519) Pulse Rate:  [63-70] 63 (04/27 0519) Resp:  [16-18] 16 (04/27 0519) BP: (123-153)/(73-78) 153/75 mmHg (04/27 0519) SpO2:  [97 %-100 %] 100 % (04/27 0519) Last BM Date: 03/09/13 General:   Alert,  Well-developed, thin elderly wf   in NAD Heart:  Regular rate and rhythm; no murmurs Pulm;clear Abdomen:  Soft, nontender and nondistended. Normal bowel sounds, without guarding, and without rebound.   Extremities:  Without edema. Neurologic:  Alert and  oriented x4;  grossly normal neurologically. Psych:  Alert and cooperative. Normal mood and affect.  Intake/Output from previous day: 04/26 0701 - 04/27 0700 In: 765 [I.V.:765] Out: 950 [Urine:950] Intake/Output this shift:    Lab Results:  Recent Labs  03/09/13 2110 03/10/13 0535 03/10/13 1250 03/10/13 1906 03/11/13 0103  WBC 7.7 6.2  --   --   --   HGB 13.9 12.7 13.5 13.6 13.8  HCT 40.0 37.4 39.5 40.0 40.9  PLT 284 250  --   --   --    BMET  Recent Labs  03/09/13 2110 03/10/13 0535  NA 140 143  K 3.5 3.6  CL 102 107  CO2 27 28  GLUCOSE 124* 86  BUN 9 6  CREATININE 0.72 0.64  CALCIUM 9.6 8.7   LFT  Recent Labs  03/09/13 2110  PROT 6.9  ALBUMIN 3.8  AST 28  ALT 17  ALKPHOS 74  BILITOT 0.3   PT/INR  Recent Labs  03/09/13 2110  LABPROT 12.1  INR 0.90    Assessment / Plan: # 79 yo female with hx of colonic AVM's  With recurrent low grade GI bleeding For colonoscopy today with APC if indicated Discharge home this afternoon  As long as no unexpected findings on colonoscopy. Principal Problem:   Hematochezia Active Problems:   AVM (arteriovenous  malformation) of colon with hemorrhage     LOS: 2 days   Amy Esterwood  03/11/2013, 9:56 AM   GI ATTENDING.  Seen and examined. Agree. For colonoscopy now.The nature of the procedure, as well as the risks, benefits, and alternatives were carefully and thoroughly reviewed with the patient. Ample time for discussion and questions allowed. The patient understood, was satisfied, and agreed to proceed.  Wilhemina Bonito. Eda Keys., M.D. [facility] Division of Gastroenterology

## 2013-03-11 NOTE — Discharge Summary (Signed)
[facility]                                                                                   Kathleen Pope, is a 79 y.o. female  DOB Oct 25, 1933  MRN [id number].  Admission date:  03/09/2013  Discharge Date:  03/11/2013  Primary MD  Cassell Smiles., MD  Admitting Physician  Henderson Cloud, MD  Admission Diagnosis  Rectal bleeding [569.3] AVM (arteriovenous malformation) of colon with hemorrhage [747.61]  Discharge Diagnosis     Principal Problem:   Hematochezia Active Problems:   AVM (arteriovenous malformation) of colon with hemorrhage       Past Medical History  Diagnosis Date  . Chronic airway obstruction, not elsewhere classified   . Lower GI bleed multiple    AVM's  . HTN (hypertension)   . Hypercholesteremia   . Anemia   . COPD (chronic obstructive pulmonary disease)   . GI hemorrhage from post-treatment cecal ulcer 08/18/2012  . Diverticulosis   . Hemorrhoids     Past Surgical History  Procedure Laterality Date  . Colectomy    . Tonsillectomy    . Bunionectomy      left  . Colonoscopy  08/10/2012    Procedure: COLONOSCOPY;  Surgeon: Meryl Dare, MD,FACG;  Location: [facility];  Service: Endoscopy;  Laterality: N/A;  . Colonoscopy  08/19/2012    Procedure: COLONOSCOPY;  Surgeon: Iva Boop, MD;  Location: [facility];  Service: Endoscopy;  Laterality: N/A;     Recommendations for primary care physician for things to follow:   Monitor CBC closely   Discharge Diagnoses:   Principal Problem:   Hematochezia Active Problems:   AVM (arteriovenous malformation) of colon with hemorrhage    Discharge Condition: Stable   Diet recommendation: See Discharge Instructions below   Consults GI    History of present illness and  Hospital Course:     Kindly see H&P for history of present illness and admission details, please review complete Labs, Consult reports and Test reports for all details in brief Kathleen Pope, is a 79  y.o. female, with history of lower GI bleed due to AV malformations, dyslipidemia and hypertension was to the hospital after she had some bright red blood per rectum, her H&H remained stable and she never required any transfusions, she remained stable hemodynamically and had no further lower GI bleed while in the hospital. She was seen by her gastroenterologist Dr. Marina Goodell and underwent a colonoscopy with results below. She is symptom free and stable will be discharged home with close followup with PCP and her GI physician.      Today   Subjective:   Kathleen Pope today has no headache,no chest abdominal pain,no new weakness tingling or numbness, feels much better wants to go home today.    Objective:   Blood pressure 153/75, pulse 63, temperature 97.7 F (36.5 C), temperature source Oral, resp. rate 16, height 5\' 1"  (1.549 m), weight 46.947 kg (103 lb 8 oz), SpO2 100.00%.   Intake/Output Summary (Last 24 hours) at 03/11/13 0836 Last data filed at 03/11/13 0600  Gross per 24 hour  Intake    765  ml  Output    950 ml  Net   -185 ml    Exam Awake Alert, Oriented *3, No new F.N deficits, Normal affect [place].AT,PERRAL Supple Neck,No JVD, No cervical lymphadenopathy appriciated.  Symmetrical Chest wall movement, Good air movement bilaterally, CTAB RRR,No Gallops,Rubs or new Murmurs, No Parasternal Heave +ve B.Sounds, Abd Soft, Non tender, No organomegaly appriciated, No rebound -guarding or rigidity. No Cyanosis, Clubbing or edema, No new Rash or bruise   Data Review   Major procedures and Radiology Reports - PLEASE review detailed and final reports for all details in brief -   Colonoscopy -   ENDOSCOPIC IMPRESSION:   1. There was evidence of a prior colo-colonic surgical anastomosis in the sigmoid colon  2. The colonic mucosa was otherwise normal  3. Bleeding secondary to internal hemorrhoids    RECOMMENDATIONS:   1 .Continue medicated suppositories and hemorrhoidal care  2.  Okay for discharge home later today with family. Discussed with patient and family.Will sign off      Micro Results     No results found for this or any previous visit (from the past 240 hour(s)).   CBC w Diff: Lab Results  Component Value Date   WBC 6.2 03/10/2013   HGB 13.8 03/11/2013   HCT 40.9 03/11/2013   PLT 250 03/10/2013   LYMPHOPCT 26.9 08/29/2012   MONOPCT 8.3 08/29/2012   EOSPCT 2.6 08/29/2012   BASOPCT 0.4 08/29/2012    CMP: Lab Results  Component Value Date   NA 143 03/10/2013   K 3.6 03/10/2013   CL 107 03/10/2013   CO2 28 03/10/2013   BUN 6 03/10/2013   CREATININE 0.64 03/10/2013   PROT 6.9 03/09/2013   ALBUMIN 3.8 03/09/2013   BILITOT 0.3 03/09/2013   ALKPHOS 74 03/09/2013   AST 28 03/09/2013   ALT 17 03/09/2013  .   Discharge Instructions     Follow with Primary MD Cassell Smiles., MD in 3 days   Get CBC, CMP, checked 3 days by Primary MD and again as instructed by your Primary MD.   Get Medicines reviewed and adjusted.  Please request your Prim.MD to go over all Hospital Tests and Procedure/Radiological results at the follow up, please get all Hospital records sent to your Prim MD by signing hospital release before you go home.  Activity: As tolerated with Full fall precautions use walker/cane & assistance as needed   Diet:  Heart Healthy  For Heart failure patients - Check your Weight same time everyday, if you gain over 2 pounds, or you develop in leg swelling, experience more shortness of breath or chest pain, call your Primary MD immediately. Follow Cardiac Low Salt Diet and 1.8 lit/day fluid restriction.  Disposition Home   If you experience worsening of your admission symptoms, develop shortness of breath, life threatening emergency, suicidal or homicidal thoughts you must seek medical attention immediately by calling 911 or calling your MD immediately  if symptoms less severe.  You Must read complete instructions/literature along with all the  possible adverse reactions/side effects for all the Medicines you take and that have been prescribed to you. Take any new Medicines after you have completely understood and accpet all the possible adverse reactions/side effects.   Do not drive and provide baby sitting services if your were admitted for syncope or siezures until you have seen by Primary MD or a Neurologist and advised to do so again.  Do not drive when taking Pain medications.  Do not take more than prescribed Pain, Sleep and Anxiety Medications  Special Instructions: If you have smoked or chewed Tobacco  in the last 2 yrs please stop smoking, stop any regular Alcohol  and or any Recreational drug use.  Wear Seat belts while driving.   Follow-up Information   Follow up with Cassell Smiles., MD. Schedule an appointment as soon as possible for a visit in 3 days.   Contact information:   [address] [place] [address] [phone]       Follow up with Yancey Flemings, MD. Schedule an appointment as soon as possible for a visit in 1 week.   Contact information:   [address] [place] [address] [phone]         Discharge Medications     Medication List    TAKE these medications       acetaminophen 500 MG tablet  Commonly known as:  TYLENOL  Take 1,000 mg by mouth every 6 (six) hours as needed. Pain     amLODipine 5 MG tablet  Commonly known as:  NORVASC  Take 5 mg by mouth daily.     calcium carbonate 600 MG Tabs  Commonly known as:  OS-CAL  Take 600 mg by mouth daily.     ECHINACEA PO  Take 1 capsule by mouth daily.     ferrous sulfate 325 (65 FE) MG EC tablet  Take 325 mg by mouth 2 (two) times daily.     fish oil-omega-3 fatty acids 1000 MG capsule  Take 1 g by mouth daily.     hydrocortisone 25 MG suppository  Commonly known as:  ANUSOL-HC  Use 1 suppository at bedtime.     multivitamin tablet  Take 1 tablet by mouth daily.     pantoprazole 40 MG  tablet  Commonly known as:  PROTONIX  Take 1 tablet (40 mg total) by mouth daily.     pravastatin 10 MG tablet  Commonly known as:  PRAVACHOL  Take 10 mg by mouth daily.           Total Time in preparing paper work, data evaluation and todays exam - 35 minutes  Leroy Sea M.D on 03/11/2013 at 8:36 AM  [facility] Office  [phone]

## 2013-03-11 NOTE — Op Note (Signed)
[facility] [address] [place], [address]   COLONOSCOPY PROCEDURE REPORT  PATIENT: Kathleen Pope, Kathleen Pope  MR#: [id number] BIRTHDATE: 12-01-1932 , 80  yrs. old GENDER: Female ENDOSCOPIST: Roxy Cedar, MD REFERRED AO:[facility] PROCEDURE DATE:  03/11/2013 PROCEDURE:   Colonoscopy, diagnostic ASA CLASS:   Class II INDICATIONS:hematochezia. MEDICATIONS: Fentanyl 50 mcg IV and Versed 5 mg IV  DESCRIPTION OF PROCEDURE:   After the risks benefits and alternatives of the procedure were thoroughly explained, informed consent was obtained.  A digital rectal exam revealed no abnormalities of the rectum.   The Pentax Adult Colonscope [id number] endoscope was introduced through the anus and advanced to the cecum, which was identified by both the appendix and ileocecal valve. No adverse events experienced.   The quality of the prep was excellent, using MoviPrep  The instrument was then slowly withdrawn as the colon was fully examined.      COLON FINDINGS: The colonoscope was advanced per rectum to the cecal tip. There was evidence of scarring from prior treatment of cecal AVMs. However, no significant AVMs were bleeding.There was evidence of a prior colo-colonic surgical anastomosis in the sigmoid colon. The colon mucosa was otherwise normal.  Retroflexed views revealed internal hemorrhoids. The time to cecum=4 minutes 0 seconds. Withdrawal time=9 minutes 0 seconds.  The scope was withdrawn and the procedure completed. COMPLICATIONS: There were no complications.  ENDOSCOPIC IMPRESSION: 1.   There was evidence of a prior colo-colonic surgical anastomosis in the sigmoid colon 2.   The colonic mucosa was otherwise normal 3.   Bleeding secondary to internal hemorrhoids  RECOMMENDATIONS: 1 .Continue medicated suppositories and hemorrhoidal care 2. Okay for discharge home later today with family. Discussed with patient and family.Will sign off    eSigned:  Roxy Cedar, MD 03/11/2013 1:16 PM  cc: The Patient and Elfredia Nevins, MD   PATIENT NAME:  Kathleen Pope, Kathleen Pope MR#: [id number]

## 2013-03-11 NOTE — Progress Notes (Signed)
Pt to endo for colonscopy.

## 2013-03-12 ENCOUNTER — Encounter (HOSPITAL_COMMUNITY): Payer: Self-pay | Admitting: Internal Medicine

## 2013-03-13 LAB — TYPE AND SCREEN
ABO/RH(D): B POS
Unit division: 0

## 2013-03-21 ENCOUNTER — Telehealth: Payer: Self-pay | Admitting: Internal Medicine

## 2013-03-21 NOTE — Telephone Encounter (Signed)
Pt states she has been having rectal bleeding since her colon from her hemorrhoids. Pt is using the suppositories she was given but wants to know how long this may last. Discussed with pt that it takes a while for the irritation to calm down and to give [place] a call if she is still having bleeding once she has finished the script. Pt aware.

## 2013-03-22 ENCOUNTER — Telehealth: Payer: Self-pay | Admitting: Nurse Practitioner

## 2013-03-22 MED ORDER — HYDROCORTISONE ACETATE 25 MG RE SUPP: 25.0000 mg | Freq: Two times a day (BID) | RECTAL | Status: AC

## 2013-03-22 NOTE — Telephone Encounter (Signed)
Daily metamucil, sitz baths, Anusol suppositories BID for 4 more weeks. If still bleeding at that point can refer to general surgery

## 2013-03-22 NOTE — Telephone Encounter (Signed)
Pt aware and script sent to pharmacy. 

## 2013-07-02 ENCOUNTER — Other Ambulatory Visit (HOSPITAL_COMMUNITY): Payer: Self-pay | Admitting: Internal Medicine

## 2013-07-02 DIAGNOSIS — Z139 Encounter for screening, unspecified: Secondary | ICD-10-CM

## 2013-07-05 ENCOUNTER — Ambulatory Visit (HOSPITAL_COMMUNITY)
Admission: RE | Admit: 2013-07-05 | Discharge: 2013-07-05 | Disposition: A | Discharge status: Home / Self Care | Payer: Medicare Other | Source: Ambulatory Visit | Attending: Internal Medicine | Admitting: Internal Medicine

## 2013-07-05 DIAGNOSIS — Z1231 Encounter for screening mammogram for malignant neoplasm of breast: Secondary | ICD-10-CM | POA: Insufficient documentation

## 2013-07-05 DIAGNOSIS — Z139 Encounter for screening, unspecified: Secondary | ICD-10-CM

## 2013-07-10 ENCOUNTER — Other Ambulatory Visit: Payer: Self-pay | Admitting: Internal Medicine

## 2013-07-10 DIAGNOSIS — R928 Other abnormal and inconclusive findings on diagnostic imaging of breast: Secondary | ICD-10-CM

## 2013-07-25 ENCOUNTER — Ambulatory Visit (HOSPITAL_COMMUNITY)
Admission: RE | Admit: 2013-07-25 | Discharge: 2013-07-25 | Disposition: A | Discharge status: Home / Self Care | Payer: Medicare Other | Source: Ambulatory Visit | Attending: Internal Medicine | Admitting: Internal Medicine

## 2013-07-25 ENCOUNTER — Other Ambulatory Visit: Payer: Self-pay | Admitting: Internal Medicine

## 2013-07-25 DIAGNOSIS — R928 Other abnormal and inconclusive findings on diagnostic imaging of breast: Secondary | ICD-10-CM

## 2013-09-21 ENCOUNTER — Other Ambulatory Visit (HOSPITAL_COMMUNITY): Payer: Self-pay | Admitting: Internal Medicine

## 2013-09-21 DIAGNOSIS — R0989 Other specified symptoms and signs involving the circulatory and respiratory systems: Secondary | ICD-10-CM

## 2013-09-21 DIAGNOSIS — R011 Cardiac murmur, unspecified: Secondary | ICD-10-CM

## 2013-09-25 ENCOUNTER — Ambulatory Visit (HOSPITAL_COMMUNITY)
Admission: RE | Admit: 2013-09-25 | Discharge: 2013-09-25 | Disposition: A | Discharge status: Home / Self Care | Payer: Medicare Other | Source: Ambulatory Visit | Attending: Internal Medicine | Admitting: Internal Medicine

## 2013-09-25 DIAGNOSIS — R0989 Other specified symptoms and signs involving the circulatory and respiratory systems: Secondary | ICD-10-CM

## 2013-09-25 DIAGNOSIS — I658 Occlusion and stenosis of other precerebral arteries: Secondary | ICD-10-CM | POA: Insufficient documentation

## 2013-09-25 DIAGNOSIS — R011 Cardiac murmur, unspecified: Secondary | ICD-10-CM

## 2013-09-25 DIAGNOSIS — I6529 Occlusion and stenosis of unspecified carotid artery: Secondary | ICD-10-CM | POA: Insufficient documentation

## 2013-09-25 DIAGNOSIS — I700 Atherosclerosis of aorta: Secondary | ICD-10-CM | POA: Insufficient documentation

## 2013-12-19 ENCOUNTER — Other Ambulatory Visit (HOSPITAL_COMMUNITY): Payer: Self-pay | Admitting: Internal Medicine

## 2013-12-19 DIAGNOSIS — R928 Other abnormal and inconclusive findings on diagnostic imaging of breast: Secondary | ICD-10-CM

## 2014-01-23 ENCOUNTER — Ambulatory Visit (HOSPITAL_COMMUNITY)
Admission: RE | Admit: 2014-01-23 | Discharge: 2014-01-23 | Disposition: A | Discharge status: Home / Self Care | Payer: Medicare Other | Source: Ambulatory Visit | Attending: Internal Medicine | Admitting: Internal Medicine

## 2014-01-23 ENCOUNTER — Encounter (HOSPITAL_COMMUNITY): Payer: Medicare Other

## 2014-01-23 DIAGNOSIS — Z1231 Encounter for screening mammogram for malignant neoplasm of breast: Secondary | ICD-10-CM | POA: Insufficient documentation

## 2014-01-23 DIAGNOSIS — R928 Other abnormal and inconclusive findings on diagnostic imaging of breast: Secondary | ICD-10-CM

## 2014-03-14 ENCOUNTER — Ambulatory Visit (INDEPENDENT_AMBULATORY_CARE_PROVIDER_SITE_OTHER): Payer: Medicare Other | Admitting: Orthopedic Surgery

## 2014-03-14 ENCOUNTER — Encounter: Payer: Self-pay | Admitting: Orthopedic Surgery

## 2014-03-14 ENCOUNTER — Ambulatory Visit (INDEPENDENT_AMBULATORY_CARE_PROVIDER_SITE_OTHER): Payer: Medicare Other

## 2014-03-14 VITALS — BP 139/80 | Ht 61.5 in | Wt 105.0 lb

## 2014-03-14 DIAGNOSIS — M654 Radial styloid tenosynovitis [de Quervain]: Secondary | ICD-10-CM

## 2014-03-14 DIAGNOSIS — M25539 Pain in unspecified wrist: Secondary | ICD-10-CM

## 2014-03-14 DIAGNOSIS — M25531 Pain in right wrist: Secondary | ICD-10-CM

## 2014-03-14 HISTORY — DX: Radial styloid tenosynovitis (de quervain): M65.4

## 2014-03-14 NOTE — Progress Notes (Signed)
   Subjective:    Patient ID: Kathleen Pope, female    DOB: 19-Aug-1933, 80 y.o.   MRN: [id number]  HPI Comments: Pain right wrist x 6 weeks   Wrist Pain  This is a new problem. Episode onset: 6 weeks. The problem occurs constantly. The quality of the pain is described as aching, burning and sharp. The pain is at a severity of 9/10. Associated symptoms include joint locking and a limited range of motion. Pertinent negatives include no fever, inability to bear weight, itching, numbness, stiffness or tingling.      Review of Systems  Constitutional: Negative for fever.  Eyes: Positive for discharge.  Musculoskeletal: Positive for gait problem. Negative for stiffness.  Skin: Negative for itching.  Neurological: Negative for tingling and numbness.  Hematological: Bruises/bleeds easily.  All other systems reviewed and are negative.      Objective:   Physical Exam  Nursing note and vitals reviewed. Constitutional: She is oriented to person, place, and time. She appears well-developed and well-nourished. No distress.  HENT:  Head: Normocephalic and atraumatic.  Cardiovascular: Intact distal pulses.   Neurological: She is alert and oriented to person, place, and time. She has normal reflexes. She displays normal reflexes. She exhibits normal muscle tone.  Skin: Skin is warm and dry. No rash noted. She is not diaphoretic. No pallor.  Psychiatric: She has a normal mood and affect. Judgment and thought content normal.  Ortho Exam Right wrist compared to left wrist right wrist is swollen there is tenderness over the extensor compartment #1 is painful ulnar deviation positive Finkelstein sign. Extensor flexor tendons are intact strong normal excursion. No instability noted in the metacarpophalangeal joint       Assessment & Plan:  X-rays done in our office showed no major changes some mild degenerative changes in the hand and wrist area.  Impression Encounter Diagnoses  Name Primary?  .  Right wrist pain Yes  . De Quervain's disease (tenosynovitis)     Our plan is for wrist splint involving the thumb. Ice 3 times a day Aspercreme 3 times a day Tylenol for pain secondary to GI problems.

## 2014-03-14 NOTE — Patient Instructions (Addendum)
Apply Aspercreme 3 times a day Ice 3 times a day Wear splint for 6 weeks    De Quervain's Disease Harriet Pho disease is a condition often seen in racquet sports where there is a soreness (inflammation) in the cord like structures (tendons) which attach muscle to bone on the thumb side of the wrist. There may be a tightening of the tissuesaround the tendons. This condition is often helped by giving up or modifying the activity which caused it. When conservative treatment does not help, surgery may be required. Conservative treatment could include changes in the activity which brought about the problem or made it worse. Anti-inflammatory medications and injections may be used to help decrease the inflammation and help with pain control. Your caregiver will help you determine which is best for you. DIAGNOSIS  Often the diagnosis (learning what is wrong) can be made by examination. Sometimes x-rays are required. HOME CARE INSTRUCTIONS   Apply ice to the sore area for 15-20 minutes, 03-04 times per day while awake. Put the ice in a plastic bag and place a towel between the bag of ice and your skin. This is especially helpful if it can be done after all activities involving the sore wrist.  Temporary splinting may help.  Only take over-the-counter or prescription medicines for pain, discomfort or fever as directed by your caregiver. SEEK MEDICAL CARE IF:   Pain relief is not obtained with medications, or if you have increasing pain and seem to be getting worse rather than better. MAKE SURE YOU:   Understand these instructions.  Will watch your condition.  Will get help right away if you are not doing well or get worse. Document Released: 07/27/2001 Document Revised: 01/24/2012 Document Reviewed: 11/01/2005 [facility] Patient Information 2014 [place].

## 2014-04-11 ENCOUNTER — Other Ambulatory Visit (HOSPITAL_COMMUNITY): Payer: Self-pay | Admitting: Family Medicine

## 2014-04-11 ENCOUNTER — Ambulatory Visit (HOSPITAL_COMMUNITY)
Admission: RE | Admit: 2014-04-11 | Discharge: 2014-04-11 | Disposition: A | Discharge status: Home / Self Care | Payer: Medicare Other | Source: Ambulatory Visit | Attending: Family Medicine | Admitting: Family Medicine

## 2014-04-11 DIAGNOSIS — J069 Acute upper respiratory infection, unspecified: Secondary | ICD-10-CM

## 2014-04-11 DIAGNOSIS — J449 Chronic obstructive pulmonary disease, unspecified: Secondary | ICD-10-CM | POA: Insufficient documentation

## 2014-04-11 DIAGNOSIS — R0989 Other specified symptoms and signs involving the circulatory and respiratory systems: Secondary | ICD-10-CM | POA: Insufficient documentation

## 2014-04-11 DIAGNOSIS — R05 Cough: Secondary | ICD-10-CM | POA: Insufficient documentation

## 2014-04-11 DIAGNOSIS — J4489 Other specified chronic obstructive pulmonary disease: Secondary | ICD-10-CM | POA: Insufficient documentation

## 2014-04-11 DIAGNOSIS — R059 Cough, unspecified: Secondary | ICD-10-CM | POA: Insufficient documentation

## 2014-04-25 ENCOUNTER — Encounter: Payer: Self-pay | Admitting: Orthopedic Surgery

## 2014-04-25 ENCOUNTER — Ambulatory Visit (INDEPENDENT_AMBULATORY_CARE_PROVIDER_SITE_OTHER): Payer: Medicare Other | Admitting: Orthopedic Surgery

## 2014-04-25 DIAGNOSIS — M654 Radial styloid tenosynovitis [de Quervain]: Secondary | ICD-10-CM

## 2014-04-25 NOTE — Patient Instructions (Signed)
Ice and topical arthritis cream

## 2014-04-25 NOTE — Progress Notes (Signed)
Patient ID: Kathleen Pope, female   DOB: 1933/08/08, 80 y.o.   MRN: [id number]  Chief Complaint  Patient presents with  . Wrist Pain    dequervains f/u     previous history: HPI Comments: Pain right wrist x 6 weeks   Wrist Pain   This is a new problem. Episode onset: 6 weeks. The problem occurs constantly. The quality of the pain is described as aching, burning and sharp. The pain is at a severity of 9/10. Associated symptoms include joint locking and a limited range of motion. Pertinent negatives include no fever, inability to bear weight, itching, numbness, stiffness or tingling.   She says its better   Our plan is for wrist splint involving the thumb. Ice 3 times a day Aspercreme 3 times a day Tylenol for pain secondary to GI problems.  ROS normal     General the patient is well-developed and well-nourished grooming and hygiene are normal Oriented x3 Mood and affect normal Inspection of the right wrist no tenderness  Range of motion normal  All joints are stable Motor exam is normal Skin clean dry and intact  Cardiovascular exam is normal Sensory exam normal  Nrmal Finkelsteins test   rec ice and aspercreme   Discharge

## 2014-06-28 ENCOUNTER — Other Ambulatory Visit (HOSPITAL_COMMUNITY): Payer: Self-pay | Admitting: Internal Medicine

## 2014-06-28 DIAGNOSIS — R928 Other abnormal and inconclusive findings on diagnostic imaging of breast: Secondary | ICD-10-CM

## 2014-07-30 ENCOUNTER — Ambulatory Visit (HOSPITAL_COMMUNITY)
Admission: RE | Admit: 2014-07-30 | Discharge: 2014-07-30 | Disposition: A | Discharge status: Home / Self Care | Payer: Medicare Other | Source: Ambulatory Visit | Attending: Internal Medicine | Admitting: Internal Medicine

## 2014-07-30 DIAGNOSIS — Z09 Encounter for follow-up examination after completed treatment for conditions other than malignant neoplasm: Secondary | ICD-10-CM | POA: Insufficient documentation

## 2014-07-30 DIAGNOSIS — N6489 Other specified disorders of breast: Secondary | ICD-10-CM | POA: Insufficient documentation

## 2014-07-30 DIAGNOSIS — R928 Other abnormal and inconclusive findings on diagnostic imaging of breast: Secondary | ICD-10-CM

## 2014-10-01 ENCOUNTER — Other Ambulatory Visit (HOSPITAL_COMMUNITY): Payer: Self-pay | Admitting: Internal Medicine

## 2014-10-01 DIAGNOSIS — I7090 Unspecified atherosclerosis: Secondary | ICD-10-CM

## 2014-10-01 DIAGNOSIS — Z0000 Encounter for general adult medical examination without abnormal findings: Secondary | ICD-10-CM

## 2014-10-07 ENCOUNTER — Ambulatory Visit (HOSPITAL_COMMUNITY)
Admission: RE | Admit: 2014-10-07 | Discharge: 2014-10-07 | Disposition: A | Discharge status: Home / Self Care | Payer: Medicare Other | Source: Ambulatory Visit | Attending: Internal Medicine | Admitting: Internal Medicine

## 2014-10-07 DIAGNOSIS — I7090 Unspecified atherosclerosis: Secondary | ICD-10-CM

## 2014-10-07 DIAGNOSIS — F17210 Nicotine dependence, cigarettes, uncomplicated: Secondary | ICD-10-CM | POA: Insufficient documentation

## 2014-10-07 DIAGNOSIS — E119 Type 2 diabetes mellitus without complications: Secondary | ICD-10-CM | POA: Diagnosis not present

## 2014-10-07 DIAGNOSIS — I10 Essential (primary) hypertension: Secondary | ICD-10-CM | POA: Diagnosis not present

## 2014-10-07 DIAGNOSIS — M8580 Other specified disorders of bone density and structure, unspecified site: Secondary | ICD-10-CM | POA: Diagnosis present

## 2014-10-07 DIAGNOSIS — Z720 Tobacco use: Secondary | ICD-10-CM | POA: Insufficient documentation

## 2014-10-07 DIAGNOSIS — Z780 Asymptomatic menopausal state: Secondary | ICD-10-CM | POA: Insufficient documentation

## 2014-10-07 DIAGNOSIS — Z0000 Encounter for general adult medical examination without abnormal findings: Secondary | ICD-10-CM

## 2014-10-07 DIAGNOSIS — Z13820 Encounter for screening for osteoporosis: Secondary | ICD-10-CM | POA: Insufficient documentation

## 2014-10-07 DIAGNOSIS — I6523 Occlusion and stenosis of bilateral carotid arteries: Secondary | ICD-10-CM | POA: Diagnosis not present

## 2015-08-07 ENCOUNTER — Other Ambulatory Visit (HOSPITAL_COMMUNITY): Payer: Self-pay | Admitting: Internal Medicine

## 2015-08-07 DIAGNOSIS — Z1231 Encounter for screening mammogram for malignant neoplasm of breast: Secondary | ICD-10-CM

## 2015-08-13 ENCOUNTER — Ambulatory Visit (HOSPITAL_COMMUNITY)
Admission: RE | Admit: 2015-08-13 | Discharge: 2015-08-13 | Disposition: A | Discharge status: Home / Self Care | Payer: Medicare Other | Source: Ambulatory Visit | Attending: Internal Medicine | Admitting: Internal Medicine

## 2015-08-13 DIAGNOSIS — Z1231 Encounter for screening mammogram for malignant neoplasm of breast: Secondary | ICD-10-CM | POA: Diagnosis not present

## 2016-08-10 ENCOUNTER — Other Ambulatory Visit (HOSPITAL_COMMUNITY): Payer: Self-pay | Admitting: Internal Medicine

## 2016-08-10 DIAGNOSIS — Z1231 Encounter for screening mammogram for malignant neoplasm of breast: Secondary | ICD-10-CM

## 2016-08-16 ENCOUNTER — Ambulatory Visit (HOSPITAL_COMMUNITY)
Admission: RE | Admit: 2016-08-16 | Discharge: 2016-08-16 | Disposition: A | Discharge status: Home / Self Care | Payer: Medicare Other | Source: Ambulatory Visit | Attending: Internal Medicine | Admitting: Internal Medicine

## 2016-08-16 DIAGNOSIS — Z1231 Encounter for screening mammogram for malignant neoplasm of breast: Secondary | ICD-10-CM | POA: Diagnosis not present

## 2016-10-05 ENCOUNTER — Other Ambulatory Visit (HOSPITAL_COMMUNITY): Payer: Self-pay | Admitting: Internal Medicine

## 2016-10-05 DIAGNOSIS — I6529 Occlusion and stenosis of unspecified carotid artery: Secondary | ICD-10-CM | POA: Diagnosis not present

## 2016-10-05 DIAGNOSIS — Z0001 Encounter for general adult medical examination with abnormal findings: Secondary | ICD-10-CM | POA: Diagnosis not present

## 2016-10-05 DIAGNOSIS — Z1389 Encounter for screening for other disorder: Secondary | ICD-10-CM | POA: Diagnosis not present

## 2016-10-05 DIAGNOSIS — I6523 Occlusion and stenosis of bilateral carotid arteries: Secondary | ICD-10-CM

## 2016-10-05 DIAGNOSIS — J439 Emphysema, unspecified: Secondary | ICD-10-CM | POA: Diagnosis not present

## 2016-10-05 DIAGNOSIS — Z6820 Body mass index (BMI) 20.0-20.9, adult: Secondary | ICD-10-CM | POA: Diagnosis not present

## 2016-10-05 DIAGNOSIS — Z23 Encounter for immunization: Secondary | ICD-10-CM | POA: Diagnosis not present

## 2016-10-05 DIAGNOSIS — J449 Chronic obstructive pulmonary disease, unspecified: Secondary | ICD-10-CM | POA: Diagnosis not present

## 2016-10-12 ENCOUNTER — Ambulatory Visit (HOSPITAL_COMMUNITY)
Admission: RE | Admit: 2016-10-12 | Discharge: 2016-10-12 | Disposition: A | Discharge status: Home / Self Care | Payer: Medicare Other | Source: Ambulatory Visit | Attending: Internal Medicine | Admitting: Internal Medicine

## 2016-10-12 DIAGNOSIS — E041 Nontoxic single thyroid nodule: Secondary | ICD-10-CM | POA: Diagnosis not present

## 2016-10-12 DIAGNOSIS — I6523 Occlusion and stenosis of bilateral carotid arteries: Secondary | ICD-10-CM

## 2016-10-12 DIAGNOSIS — I6529 Occlusion and stenosis of unspecified carotid artery: Secondary | ICD-10-CM | POA: Insufficient documentation

## 2017-03-10 DIAGNOSIS — K529 Noninfective gastroenteritis and colitis, unspecified: Secondary | ICD-10-CM | POA: Diagnosis not present

## 2017-03-10 DIAGNOSIS — J449 Chronic obstructive pulmonary disease, unspecified: Secondary | ICD-10-CM | POA: Diagnosis not present

## 2017-03-10 DIAGNOSIS — Z681 Body mass index (BMI) 19 or less, adult: Secondary | ICD-10-CM | POA: Diagnosis not present

## 2017-03-10 DIAGNOSIS — R011 Cardiac murmur, unspecified: Secondary | ICD-10-CM | POA: Diagnosis not present

## 2017-03-14 ENCOUNTER — Other Ambulatory Visit (INDEPENDENT_AMBULATORY_CARE_PROVIDER_SITE_OTHER): Payer: Medicare Other

## 2017-03-14 ENCOUNTER — Ambulatory Visit (INDEPENDENT_AMBULATORY_CARE_PROVIDER_SITE_OTHER): Payer: Medicare Other | Admitting: Physician Assistant

## 2017-03-14 ENCOUNTER — Encounter: Payer: Self-pay | Admitting: Physician Assistant

## 2017-03-14 VITALS — BP 126/74 | Ht 61.0 in | Wt 103.0 lb

## 2017-03-14 DIAGNOSIS — R112 Nausea with vomiting, unspecified: Secondary | ICD-10-CM

## 2017-03-14 DIAGNOSIS — K529 Noninfective gastroenteritis and colitis, unspecified: Secondary | ICD-10-CM

## 2017-03-14 DIAGNOSIS — R197 Diarrhea, unspecified: Secondary | ICD-10-CM

## 2017-03-14 LAB — CBC WITH DIFFERENTIAL/PLATELET
Basophils Absolute: 0 10*3/uL (ref 0.0–0.1)
Basophils Relative: 0.1 % (ref 0.0–3.0)
EOS ABS: 0.2 10*3/uL (ref 0.0–0.7)
Eosinophils Relative: 2.6 % (ref 0.0–5.0)
HCT: 43.2 % (ref 36.0–46.0)
Hemoglobin: 14.9 g/dL (ref 12.0–15.0)
LYMPHS ABS: 2.6 10*3/uL (ref 0.7–4.0)
Lymphocytes Relative: 30.7 % (ref 12.0–46.0)
MCHC: 34.4 g/dL (ref 30.0–36.0)
MCV: 96.2 fl (ref 78.0–100.0)
Monocytes Absolute: 0.8 10*3/uL (ref 0.1–1.0)
Monocytes Relative: 9.2 % (ref 3.0–12.0)
Neutro Abs: 4.9 10*3/uL (ref 1.4–7.7)
Neutrophils Relative %: 57.4 % (ref 43.0–77.0)
Platelets: 295 10*3/uL (ref 150.0–400.0)
RBC: 4.49 Mil/uL (ref 3.87–5.11)
RDW: 13.9 % (ref 11.5–15.5)
WBC: 8.6 10*3/uL (ref 4.0–10.5)

## 2017-03-14 LAB — BASIC METABOLIC PANEL
BUN: 10 mg/dL (ref 6–23)
CO2: 30 mEq/L (ref 19–32)
Calcium: 10 mg/dL (ref 8.4–10.5)
Chloride: 105 mEq/L (ref 96–112)
Creatinine, Ser: 0.71 mg/dL (ref 0.40–1.20)
GFR: 83.75 mL/min (ref >60.00)
Glucose, Bld: 105 mg/dL — ABNORMAL HIGH (ref 70–99)
Potassium: 3.9 mEq/L (ref 3.5–5.1)
SODIUM: 142 meq/L (ref 135–145)

## 2017-03-14 MED ORDER — SACCHAROMYCES BOULARDII 250 MG PO CAPS: 250.0000 mg | ORAL_CAPSULE | Freq: Two times a day (BID) | ORAL | 0 refills | Status: DC

## 2017-03-14 MED ORDER — DICYCLOMINE HCL 10 MG PO CAPS: ORAL_CAPSULE | ORAL | 0 refills | Status: DC

## 2017-03-14 NOTE — Progress Notes (Signed)
Subjective:    Patient ID: Kathleen Pope, female    DOB: 04-28-1933, 83 y.o.   MRN: [id number]  HPI Kathleen Pope is a pleasant 83 year old white female known to Dr. Henrene Pastor who was last seen in our office in 2014. She has history of colonic AVMs with previous GI bleeding and APC treatments. She is also status post sigmoid colectomy for sigmoid diverticular disease, has history of hypertension. Colonoscopy was done in April 2014. At that time no AVMs were seen however there was scarring from prior APC treatment and small internal hemorrhoids, no polyps. Patient comes in today with complaints of 2 week history of diarrhea. She says she had been having diarrhea fairly immediately after every meal and describes loose but not watery stools without blood. She has not had much abdominal pain or cramping but has had a constant rumbling unsettled feeling in her abdomen. She had one day of nausea and vomiting associated with this illness, no fever or chills. Appetite has been okay and she has been able to eat without difficulty. She says the diarrhea had about resolved and then had a recurrence last night. She has not been on any new medications, no antibiotics over the past 3-4 months. No known infectious exposures. She does have well water at home but uses bottled water or drinking.  Review of Systems Pertinent positive and negative review of systems were noted in the above HPI section.  All other review of systems was otherwise negative.  Outpatient Encounter Prescriptions as of 03/14/2017  Medication Sig  . acetaminophen (TYLENOL) 500 MG tablet Take 1,000 mg by mouth every 6 (six) hours as needed. Pain  . amLODipine (NORVASC) 5 MG tablet Take 5 mg by mouth daily.  . calcium carbonate (OS-CAL) 600 MG TABS Take 600 mg by mouth daily.  Marland Kitchen ECHINACEA PO Take 1 capsule by mouth daily.  . ferrous sulfate 325 (65 FE) MG EC tablet Take 325 mg by mouth 2 (two) times daily.   . fish oil-omega-3 fatty acids 1000 MG capsule  Take 1 g by mouth daily.  . hydrocortisone (ANUSOL-HC) 25 MG suppository Use 1 suppository at bedtime.  . Multiple Vitamin (MULTIVITAMIN) tablet Take 1 tablet by mouth daily.  . pantoprazole (PROTONIX) 40 MG tablet Take 1 tablet (40 mg total) by mouth daily.  . pravastatin (PRAVACHOL) 10 MG tablet Take 10 mg by mouth daily.  Marland Kitchen dicyclomine (BENTYL) 10 MG capsule Take 1 tab every 6 hours as neded for diarrhea and abdominal  Upset.  . saccharomyces boulardii (FLORASTOR) 250 MG capsule Take 1 capsule (250 mg total) by mouth 2 (two) times daily.   No facility-administered encounter medications on file as of 03/14/2017.    No Known Allergies Patient Active Problem List   Diagnosis Date Noted  . De Quervain's disease (tenosynovitis) 03/14/2014  . Rectal bleeding 03/09/2013  . Hematochezia 03/09/2013  . External hemorrhoids 03/08/2013  . Chronic lower GI bleeding 08/18/2012  . GI hemorrhage from post-treatment cecal ulcer 08/18/2012  . AVM (arteriovenous malformation) of colon with hemorrhage 08/18/2012  . HTN (hypertension) 08/18/2012  . Acute blood loss anemia 08/18/2012  . Bruit 08/04/2012  . Mixed hyperlipidemia 08/04/2012  . Smoker 08/04/2012  . Murmur 08/04/2012   Social History   Social History  . Marital status: Widowed    Spouse name: N/A  . Number of children: 1  . Years of education: N/A   Occupational History  . retired Retired   Social History Main Topics  . Smoking  status: Current Every Day Smoker    Packs/day: 0.80    Years: 59.00    Types: Cigarettes  . Smokeless tobacco: Never Used  . Alcohol use No  . Drug use: No  . Sexual activity: No   Other Topics Concern  . Not on file   Social History Narrative  . No narrative on file    [name] family history includes Anuerysm in her brother; Breast cancer in her mother and sister; Lung cancer in her father.      Objective:    Vitals:   03/14/17 1044  BP: 126/74    Physical Exam  well-developed  elderly white female in no acute distress, pleasant blood pressure 126/74, height 5 foot 1, weight 103, BMI 19.4. HEENT; nontraumatic normocephalic EOMI PERRLA sclerae anicteric, Cardiovascular;regular rate and rhythm with G5-X6 systolic murmur, Pulmonary; clear bilaterally, Abdomen; flat soft minimally tender bilateral lower quadrants is no guarding or rebound no palpable mass or hepatosplenomegaly, Rectal ;exam not done, Extremities; no clubbing cyanosis or edema skin warm and dry, Neuropsych ;mood and affect appropriate       Assessment & Plan:   #4 83 year old white female with 2 week history of acute diarrhea, moderate with 3-4 bowel movements per day and one day of associated nausea and vomiting . Symptoms are most consistent with an acute gastroenteritis likely viral, rule out food borne or possible giardiasis. #2 history of colonic AVMs status post previous APC #3 history of iron deficiency anemia secondary to above on chronic oral iron #4 status post previous sigmoid colectomy for diverticular disease  Plan; CBC with differential, BMET,, and GI pathogen panel Start Florastor one by mouth twice a day 1 month Bentyl 10 mg every 6 hours when necessary for diarrhea and GI upset. Patient was reassured that symptoms should continue to improve over the next 1-2 weeks. We will call her with results of the GI pathogen panel, and have asked her to call if symptoms have not resolved within 2 weeks.  Amy S Esterwood PA-C 03/14/2017   Cc: Redmond School, MD

## 2017-03-14 NOTE — Patient Instructions (Signed)
Please go to the basement level to have your labs drawn.   We sent prescriptions to [facility].  1.  Florasor  2. Bentyl ( dicyclomine)   Call us back in 2 weeks to speak to Amy's nurse.  Please call us sooner if you need to speak to the nurse with concerns.

## 2017-03-14 NOTE — Progress Notes (Signed)
Assessment and plans reviewed  

## 2017-03-24 ENCOUNTER — Telehealth: Payer: Self-pay | Admitting: Physician Assistant

## 2017-03-24 NOTE — Telephone Encounter (Signed)
Yes , that is fine -take for another month- if she has any worsening of sxs she needs to do the stool studies

## 2017-03-24 NOTE — Telephone Encounter (Signed)
She is continuing to have loose stools within the hour of eating her meals. She has taken 1 box of Florastor (20 days). She takes Bentyl with each meal. No liquid stools. She did not do the stools study. She asks should she repeat the Florastor?  She says she does not feel sick.

## 2017-03-25 ENCOUNTER — Other Ambulatory Visit: Payer: Self-pay

## 2017-03-25 MED ORDER — SACCHAROMYCES BOULARDII 250 MG PO CAPS
250.0000 mg | ORAL_CAPSULE | Freq: Two times a day (BID) | ORAL | 0 refills | Status: DC
Start: 2017-03-25 — End: 2017-04-06

## 2017-03-25 NOTE — Telephone Encounter (Signed)
Patient agrees with this plan.

## 2017-03-31 ENCOUNTER — Other Ambulatory Visit: Payer: Medicare Other

## 2017-03-31 DIAGNOSIS — R197 Diarrhea, unspecified: Secondary | ICD-10-CM | POA: Diagnosis not present

## 2017-03-31 DIAGNOSIS — R112 Nausea with vomiting, unspecified: Secondary | ICD-10-CM | POA: Diagnosis not present

## 2017-03-31 DIAGNOSIS — K529 Noninfective gastroenteritis and colitis, unspecified: Secondary | ICD-10-CM | POA: Diagnosis not present

## 2017-04-04 LAB — GASTROINTESTINAL PATHOGEN PANEL PCR
C. DIFFICILE TOX A/B, PCR: NOT DETECTED
CAMPYLOBACTER, PCR: NOT DETECTED
CRYPTOSPORIDIUM, PCR: NOT DETECTED
E coli (ETEC) LT/ST PCR: NOT DETECTED
E coli (STEC) stx1/stx2, PCR: NOT DETECTED
E coli 0157, PCR: NOT DETECTED
Giardia lamblia, PCR: NOT DETECTED
Norovirus, PCR: NOT DETECTED
ROTAVIRUS, PCR: NOT DETECTED
SALMONELLA, PCR: NOT DETECTED
Shigella, PCR: NOT DETECTED

## 2017-04-06 ENCOUNTER — Telehealth: Payer: Self-pay | Admitting: Gastroenterology

## 2017-04-06 ENCOUNTER — Ambulatory Visit (INDEPENDENT_AMBULATORY_CARE_PROVIDER_SITE_OTHER): Payer: Medicare Other | Admitting: Gastroenterology

## 2017-04-06 ENCOUNTER — Encounter: Payer: Self-pay | Admitting: Gastroenterology

## 2017-04-06 VITALS — BP 130/76 | HR 86 | Temp 98.9°F | Ht 61.0 in | Wt 99.5 lb

## 2017-04-06 DIAGNOSIS — R195 Other fecal abnormalities: Secondary | ICD-10-CM | POA: Diagnosis not present

## 2017-04-06 MED ORDER — RIFAXIMIN 550 MG PO TABS: 550.0000 mg | ORAL_TABLET | Freq: Three times a day (TID) | ORAL | 0 refills | Status: DC

## 2017-04-06 NOTE — Patient Instructions (Signed)
We have sent the following medications to [facility] . 1. Xifaxan 550 mg.  You will be getting a call from [facility] regarding cost.   After taking the medication call us in 2 weeks with an  Update. As for Katina Degree RN.

## 2017-04-06 NOTE — Progress Notes (Signed)
04/06/2017 Kathleen Pope [id number] [birth year]   HISTORY OF PRESENT ILLNESS:  This is an 83 year old female who was seen here on April 30 by another one of our physician assistants for complaints of ongoing loose stools/diarrhea following an acute onset of diarrheal illness accompanied by an episode of vomiting. Thought to be infectious gastroenteritis. GI pathogen panel was negative. She tried taking Florastor probiotic for the past few weeks as recommended, but has not had any improvement in her symptoms. She continues with 3-4 liquid stools daily. She has lost 3.5 pounds since her last visit here.  She is eating well.  Otherwise denies any abdominal pain, rectal bleeding, etc.   Past Medical History:  Diagnosis Date  . Anemia   . Chronic airway obstruction, not elsewhere classified   . COPD (chronic obstructive pulmonary disease) ([place])   . Diverticulosis   . GI hemorrhage from post-treatment cecal ulcer 08/18/2012  . Hemorrhoids   . HTN (hypertension)   . Hypercholesteremia   . Lower GI bleed multiple   AVM's   Past Surgical History:  Procedure Laterality Date  . BUNIONECTOMY     left  . COLECTOMY    . COLONOSCOPY  08/10/2012   Procedure: COLONOSCOPY;  Surgeon: Ladene Artist, MD,FACG;  Location: [facility];  Service: Endoscopy;  Laterality: N/A;  . COLONOSCOPY  08/19/2012   Procedure: COLONOSCOPY;  Surgeon: Gatha Mayer, MD;  Location: [facility];  Service: Endoscopy;  Laterality: N/A;  . COLONOSCOPY N/A 03/11/2013   Procedure: COLONOSCOPY;  Surgeon: Irene Shipper, MD;  Location: [facility];  Service: Endoscopy;  Laterality: N/A;  . TONSILLECTOMY      reports that she has been smoking Cigarettes.  She has a 47.20 pack-year smoking history. She has never used smokeless tobacco. She reports that she does not drink alcohol or use drugs. family history includes Anuerysm in her brother; Breast cancer in her mother and sister; Lung cancer in her father. No Known  Allergies    Outpatient Encounter Prescriptions as of 04/06/2017  Medication Sig  . acetaminophen (TYLENOL) 500 MG tablet Take 1,000 mg by mouth every 6 (six) hours as needed. Pain  . amLODipine (NORVASC) 5 MG tablet Take 5 mg by mouth daily.  . calcium carbonate (OS-CAL) 600 MG TABS Take 600 mg by mouth daily.  Marland Kitchen dicyclomine (BENTYL) 10 MG capsule Take 1 tab every 6 hours as neded for diarrhea and abdominal  Upset.  Marland Kitchen ECHINACEA PO Take 1 capsule by mouth daily.  . ferrous sulfate 325 (65 FE) MG EC tablet Take 325 mg by mouth 2 (two) times daily.   . fish oil-omega-3 fatty acids 1000 MG capsule Take 1 g by mouth daily.  . hydrocortisone (ANUSOL-HC) 25 MG suppository Use 1 suppository at bedtime.  . Multiple Vitamin (MULTIVITAMIN) tablet Take 1 tablet by mouth daily.  . pantoprazole (PROTONIX) 40 MG tablet Take 1 tablet (40 mg total) by mouth daily.  . pravastatin (PRAVACHOL) 10 MG tablet Take 10 mg by mouth daily.  . [DISCONTINUED] saccharomyces boulardii (FLORASTOR) 250 MG capsule Take 1 capsule (250 mg total) by mouth 2 (two) times daily.   No facility-administered encounter medications on file as of 04/06/2017.     REVIEW OF SYSTEMS  : All other systems reviewed and negative except where noted in the History of Present Illness.   PHYSICAL EXAM: BP 130/76   Pulse 86   Temp 98.9 F (37.2 C)   Ht '5\' 1"'$  (1.549 m)  Wt 99 lb 8 oz (45.1 kg)   BMI 18.80 kg/m  General: Well developed white female in no acute distress Head: Normocephalic and atraumatic Eyes:  Sclerae anicteric, conjunctiva pink. Ears: Normal auditory acuity Lungs: Clear throughout to auscultation; no increased WOB. Heart: Regular rate and rhythm; SEM noted. Abdomen: Soft, nontender, non distended. No masses or hepatomegaly noted. Normal bowel sounds Musculoskeletal: Symmetrical with no gross deformities  Skin: No lesions on visible extremities Extremities: No edema  Neurological: Alert oriented x 4, grossly  non-focal Psychological:  Alert and cooperative. Normal mood and affect  ASSESSMENT AND PLAN: #66 83 year old white female with 5-6 week history of acute diarrhea, moderate with 3-4 bowel movements per day and one day of associated nausea and vomiting . Symptoms are most consistent with an acute gastroenteritis.  Loose stools still persist.  ? Post-infectious IBS.  Stool studies negative.  No improvement with Florastor and dicyclomine.  Will try two week course of Xifaxan 550 mg TID to see if that helps.  If no resolution with that then will consider colonoscopy.  She will call back after the treatment with an update on her symptoms. #2 history of colonic AVMs status post previous APC #3 history of iron deficiency anemia secondary to above on chronic oral iron #4 status post previous sigmoid colectomy for diverticular disease  CC:  Redmond School, MD

## 2017-04-06 NOTE — Progress Notes (Signed)
Initial assessment and plans reviewed

## 2017-04-07 NOTE — Telephone Encounter (Signed)
Well we can try a course of flagyl instead.  250 mg TID for 10 days.  No ETOH while on the medication or for 3 days following the medication.

## 2017-04-07 NOTE — Telephone Encounter (Signed)
Patient states that she cannot afford xifixan. Best # [phone]. Patient states that her copay would be $600

## 2017-04-07 NOTE — Telephone Encounter (Signed)
Kathleen Pope,  The co-pay for the Xifaxan 550 mg is $ 600.00 and the patient stated she cannot afford that.  What do you want to do?

## 2017-04-08 ENCOUNTER — Telehealth: Payer: Self-pay | Admitting: *Deleted

## 2017-04-08 ENCOUNTER — Other Ambulatory Visit: Payer: Self-pay | Admitting: *Deleted

## 2017-04-08 MED ORDER — METRONIDAZOLE 250 MG PO TABS: ORAL_TABLET | ORAL | 0 refills | Status: DC

## 2017-04-08 NOTE — Telephone Encounter (Signed)
[place] and asked what her co-pay would be for the Flagyl 250 mg, TID for 10 days. I was told the cost to the patient would be $3.30 . Called the patient to advise.

## 2017-04-08 NOTE — Telephone Encounter (Signed)
Called patient to advise we sent another script for Flagyl 250 mg to [facility].  Advised her that we sent it electronically and she is to avoid alcohol while on the medication and 3 days after.

## 2017-04-18 ENCOUNTER — Telehealth: Payer: Self-pay | Admitting: Internal Medicine

## 2017-04-18 NOTE — Telephone Encounter (Signed)
Colestid 2 grams bid. Have her call back in 2 weeks with update. May need colon with biopsies if problem persists. Thanks

## 2017-04-18 NOTE — Telephone Encounter (Signed)
Pt states she ran into Dr. Henrene Pastor when her daughter was having a procedure and he told her to call back and let him know how the flagyl and xifaxan worked. Pt states neither med helped at all. Now pt states she has a white coating on her tongue. Pt reports Dr. Henrene Pastor had something else he might have her try before doing a colon. Please advise.

## 2017-04-19 ENCOUNTER — Telehealth: Payer: Self-pay | Admitting: Gastroenterology

## 2017-04-19 MED ORDER — COLESTIPOL HCL 1 G PO TABS: 2.0000 g | ORAL_TABLET | Freq: Two times a day (BID) | ORAL | 3 refills | Status: DC

## 2017-04-19 NOTE — Telephone Encounter (Signed)
Discussed with pt that it is used to treat diarrhea also. Pt verbalized understanding and knows to call back with an update in a couple of weeks.

## 2017-04-19 NOTE — Telephone Encounter (Signed)
Routed to [name] 

## 2017-04-19 NOTE — Telephone Encounter (Signed)
Script sent to the pharmacy, pt aware.

## 2017-08-11 ENCOUNTER — Other Ambulatory Visit (HOSPITAL_COMMUNITY): Payer: Self-pay | Admitting: Internal Medicine

## 2017-08-11 DIAGNOSIS — Z1231 Encounter for screening mammogram for malignant neoplasm of breast: Secondary | ICD-10-CM

## 2017-08-17 ENCOUNTER — Ambulatory Visit (HOSPITAL_COMMUNITY)
Admission: RE | Admit: 2017-08-17 | Discharge: 2017-08-17 | Disposition: A | Discharge status: Home / Self Care | Payer: Medicare Other | Source: Ambulatory Visit | Attending: Internal Medicine | Admitting: Internal Medicine

## 2017-08-17 DIAGNOSIS — Z1231 Encounter for screening mammogram for malignant neoplasm of breast: Secondary | ICD-10-CM | POA: Insufficient documentation

## 2017-09-14 DIAGNOSIS — Z23 Encounter for immunization: Secondary | ICD-10-CM | POA: Diagnosis not present

## 2017-10-21 DIAGNOSIS — Z0001 Encounter for general adult medical examination with abnormal findings: Secondary | ICD-10-CM | POA: Diagnosis not present

## 2017-10-21 DIAGNOSIS — I6523 Occlusion and stenosis of bilateral carotid arteries: Secondary | ICD-10-CM | POA: Diagnosis not present

## 2017-10-21 DIAGNOSIS — I350 Nonrheumatic aortic (valve) stenosis: Secondary | ICD-10-CM | POA: Diagnosis not present

## 2017-10-21 DIAGNOSIS — Z681 Body mass index (BMI) 19 or less, adult: Secondary | ICD-10-CM | POA: Diagnosis not present

## 2017-10-21 DIAGNOSIS — E782 Mixed hyperlipidemia: Secondary | ICD-10-CM | POA: Diagnosis not present

## 2017-10-21 DIAGNOSIS — Z1389 Encounter for screening for other disorder: Secondary | ICD-10-CM | POA: Diagnosis not present

## 2017-10-21 DIAGNOSIS — E748 Other specified disorders of carbohydrate metabolism: Secondary | ICD-10-CM | POA: Diagnosis not present

## 2017-11-11 ENCOUNTER — Other Ambulatory Visit (HOSPITAL_COMMUNITY): Payer: Self-pay | Admitting: Internal Medicine

## 2017-11-11 DIAGNOSIS — I6529 Occlusion and stenosis of unspecified carotid artery: Secondary | ICD-10-CM

## 2017-11-17 DIAGNOSIS — E748 Other specified disorders of carbohydrate metabolism: Secondary | ICD-10-CM | POA: Diagnosis not present

## 2017-11-18 ENCOUNTER — Ambulatory Visit (HOSPITAL_COMMUNITY)
Admission: RE | Admit: 2017-11-18 | Discharge: 2017-11-18 | Disposition: A | Discharge status: Home / Self Care | Payer: Medicare Other | Source: Ambulatory Visit | Attending: Internal Medicine | Admitting: Internal Medicine

## 2017-11-18 DIAGNOSIS — I6529 Occlusion and stenosis of unspecified carotid artery: Secondary | ICD-10-CM

## 2017-11-18 DIAGNOSIS — I6523 Occlusion and stenosis of bilateral carotid arteries: Secondary | ICD-10-CM | POA: Insufficient documentation

## 2018-02-21 ENCOUNTER — Telehealth: Payer: Self-pay | Admitting: Internal Medicine

## 2018-02-24 ENCOUNTER — Other Ambulatory Visit: Payer: Self-pay

## 2018-02-24 MED ORDER — CHOLESTYRAMINE 4 G PO PACK: 4.0000 g | PACK | Freq: Two times a day (BID) | ORAL | 12 refills | Status: DC

## 2018-02-24 NOTE — Telephone Encounter (Signed)
Dr Henrene Pastor what alternative would you like to give?

## 2018-02-24 NOTE — Telephone Encounter (Signed)
Pt notified and aware. ?

## 2018-02-24 NOTE — Telephone Encounter (Signed)
Questran 4 g twice daily in juice

## 2018-06-09 ENCOUNTER — Ambulatory Visit: Payer: Medicare Other | Admitting: Internal Medicine

## 2018-06-09 ENCOUNTER — Encounter: Payer: Self-pay | Admitting: Internal Medicine

## 2018-06-09 VITALS — BP 118/68 | HR 78 | Ht 61.5 in | Wt 93.4 lb

## 2018-06-09 DIAGNOSIS — R197 Diarrhea, unspecified: Secondary | ICD-10-CM

## 2018-06-09 MED ORDER — NA SULFATE-K SULFATE-MG SULF 17.5-3.13-1.6 GM/177ML PO SOLN: 1.0000 | Freq: Once | ORAL | 0 refills | Status: AC

## 2018-06-09 NOTE — Progress Notes (Signed)
HISTORY OF PRESENT ILLNESS:  Kathleen Pope is a 84 y.o. female with past medical history as listed below who has a history of GI bleeding secondary to colonic AVMs requiring endoscopic hemostatic therapy. I last saw the patient in the office in 2013. She last underwent complete colonoscopy in 2014 with the only findings of prior sigmoid colon surgery and internal hemorrhoids. She was last seen in this office by the GI physician assistant May 2018 or problems with diarrhea. Workup negative. Did not try Xifaxan due to cost. Was placed on Colestid with resolution of diarrhea. Came off Colestid and did well until approximately 2 months ago. She now describes postprandial diarrhea over that time course. Occasionally she is awoken in the early hours of the morning with the need to defecate. She did try to reinstitute Colestid at the previous dose but this did not help. Imodium does help though she has been reluctant. Otherwise she feels well. No abdominal pain or bleeding. She has lost some weight since her diarrhea returned. This was the case/G her. However she was able to gain weight after her problems with diarrhea resolved. She is currently 6 pounds less than 1 year ago. She is accompanied today by her daughter Wednesday. No new medications. She does stay on iron chronically due to the history of AVMs.Marland Kitchen  REVIEW OF SYSTEMS:  All non-GI ROS negative except for urinary leakage  Past Medical History:  Diagnosis Date  . Anemia   . Chronic airway obstruction, not elsewhere classified   . COPD (chronic obstructive pulmonary disease) ([place])   . Diverticulosis   . GI hemorrhage from post-treatment cecal ulcer 08/18/2012  . Hemorrhoids   . HTN (hypertension)   . Hypercholesteremia   . Lower GI bleed multiple   AVM's    Past Surgical History:  Procedure Laterality Date  . BUNIONECTOMY     left  . COLECTOMY    . COLONOSCOPY  08/10/2012   Procedure: COLONOSCOPY;  Surgeon: Ladene Artist, MD,FACG;   Location: [facility];  Service: Endoscopy;  Laterality: N/A;  . COLONOSCOPY  08/19/2012   Procedure: COLONOSCOPY;  Surgeon: Gatha Mayer, MD;  Location: [facility];  Service: Endoscopy;  Laterality: N/A;  . COLONOSCOPY N/A 03/11/2013   Procedure: COLONOSCOPY;  Surgeon: Irene Shipper, MD;  Location: [facility];  Service: Endoscopy;  Laterality: N/A;  . TONSILLECTOMY      Social History JEANEANE ADAMEC  reports that she has been smoking cigarettes.  She has a 47.20 pack-year smoking history. She has never used smokeless tobacco. She reports that she does not drink alcohol or use drugs.  family history includes Anuerysm in her brother; Breast cancer in her mother and sister; Lung cancer in her father.  No Known Allergies     PHYSICAL EXAMINATION: Vital signs: BP 118/68   Pulse 78   Ht 5' 1.5" (1.562 m)   Wt 93 lb 6.4 oz (42.4 kg)   BMI 17.36 kg/m   Constitutional:pleasant, thin, generally well-appearing, no acute distress Psychiatric: alert and oriented x3, cooperative Eyes: extraocular movements intact, anicteric, conjunctiva pink Mouth: oral pharynx moist, no lesions Neck: supple no lymphadenopathy Cardiovascular: heart regular rate and rhythm, no murmur Lungs: clear to auscultation bilaterally Abdomen: soft, nontender, nondistended, no obvious ascites, no peritoneal signs, normal bowel sounds, no organomegaly Rectal:omitted Extremities: no clubbing, cyanosis, or lower extremity edema bilaterally Skin: no lesions on visible extremities Neuro: No focal deficits. Cranial nerves intact  ASSESSMENT:  #1. Recurrent chronic diarrhea. Rule out  collagenous colitis #2. History of bleeding colonic AVM status post endoscopic hemostatic therapy   PLAN:  #1. Okay to use Imodium on a scheduled basis. #2. Schedule colonoscopy with biopsies.The nature of the procedure, as well as the risks, benefits, and alternatives were carefully and thoroughly reviewed with the patient. Ample time  for discussion and questions allowed. The patient understood, was satisfied, and agreed to proceed.

## 2018-06-09 NOTE — Patient Instructions (Signed)
You have been scheduled for a colonoscopy. Please follow written instructions given to you at your visit today.  Please pick up your prep supplies at the pharmacy within the next 1-3 days. If you use inhalers (even only as needed), please bring them with you on the day of your procedure. Your physician has requested that you go to www.startemmi.com and enter the access code given to you at your visit today. This web site gives a general overview about your procedure. However, you should still follow specific instructions given to you by our office regarding your preparation for the procedure.   Use Imodium as needed

## 2018-06-15 ENCOUNTER — Telehealth: Payer: Self-pay | Admitting: Internal Medicine

## 2018-06-15 MED ORDER — SUPREP BOWEL PREP KIT 17.5-3.13-1.6 GM/177ML PO SOLN: 1.0000 | ORAL | 0 refills | Status: DC

## 2018-06-15 NOTE — Telephone Encounter (Signed)
Patient has been advised that Suprep was sent to [facility]. She also asked that colonoscopy be made as "screening procedure" so insurance will cover all the way. I advised that it appears Dr Henrene Pastor is doing procedure for diarrhea. We cannot just place as screening procedure as this would be insurance fraud. She verbalizes understanding.

## 2018-07-05 ENCOUNTER — Encounter: Payer: Self-pay | Admitting: Internal Medicine

## 2018-07-19 ENCOUNTER — Encounter: Payer: Self-pay | Admitting: Internal Medicine

## 2018-07-19 ENCOUNTER — Ambulatory Visit (AMBULATORY_SURGERY_CENTER): Payer: Medicare Other | Admitting: Internal Medicine

## 2018-07-19 VITALS — BP 155/75 | HR 77 | Temp 98.4°F | Resp 18 | Ht 65.0 in | Wt 93.0 lb

## 2018-07-19 DIAGNOSIS — R197 Diarrhea, unspecified: Secondary | ICD-10-CM

## 2018-07-19 DIAGNOSIS — I10 Essential (primary) hypertension: Secondary | ICD-10-CM | POA: Diagnosis not present

## 2018-07-19 DIAGNOSIS — J449 Chronic obstructive pulmonary disease, unspecified: Secondary | ICD-10-CM | POA: Diagnosis not present

## 2018-07-19 MED ORDER — SODIUM CHLORIDE 0.9 % IV SOLN: 500.0000 mL | Freq: Once | INTRAVENOUS | Status: DC

## 2018-07-19 NOTE — Progress Notes (Signed)
Pt's states no medical or surgical changes since previsit or office visit. 

## 2018-07-19 NOTE — Progress Notes (Signed)
Report to PACU, RN, vss, BBS= Clear.  

## 2018-07-19 NOTE — Progress Notes (Signed)
Called to room to assist during endoscopic procedure.  Patient ID and intended procedure confirmed with present staff. Received instructions for my participation in the procedure from the performing physician.  

## 2018-07-19 NOTE — Patient Instructions (Signed)
Okay to use Imodium 1-2 tabs up to three times a day for diarrhea.   YOU HAD AN ENDOSCOPIC PROCEDURE TODAY AT [facility]:   Refer to the procedure report that was given to you for any specific questions about what was found during the examination.  If the procedure report does not answer your questions, please call your gastroenterologist to clarify.  If you requested that your care partner not be given the details of your procedure findings, then the procedure report has been included in a sealed envelope for you to review at your convenience later.  YOU SHOULD EXPECT: Some feelings of bloating in the abdomen. Passage of more gas than usual.  Walking can help get rid of the air that was put into your GI tract during the procedure and reduce the bloating. If you had a lower endoscopy (such as a colonoscopy or flexible sigmoidoscopy) you may notice spotting of blood in your stool or on the toilet paper. If you underwent a bowel prep for your procedure, you may not have a normal bowel movement for a few days.  Please Note:  You might notice some irritation and congestion in your nose or some drainage.  This is from the oxygen used during your procedure.  There is no need for concern and it should clear up in a day or so.  SYMPTOMS TO REPORT IMMEDIATELY:   Following lower endoscopy (colonoscopy or flexible sigmoidoscopy):  Excessive amounts of blood in the stool  Significant tenderness or worsening of abdominal pains  Swelling of the abdomen that is new, acute  Fever of 100F or higher   For urgent or emergent issues, a gastroenterologist can be reached at any hour by calling [phone].   DIET:  We do recommend a small meal at first, but then you may proceed to your regular diet.  Drink plenty of fluids but you should avoid alcoholic beverages for 24 hours.  ACTIVITY:  You should plan to take it easy for the rest of today and you should NOT DRIVE or use heavy machinery  until tomorrow (because of the sedation medicines used during the test).    FOLLOW UP: Our staff will call the number listed on your records the next business day following your procedure to check on you and address any questions or concerns that you may have regarding the information given to you following your procedure. If we do not reach you, we will leave a message.  However, if you are feeling well and you are not experiencing any problems, there is no need to return our call.  We will assume that you have returned to your regular daily activities without incident.  If any biopsies were taken you will be contacted by phone or by letter within the next 1-3 weeks.  Please call us at [phone] if you have not heard about the biopsies in 3 weeks.    SIGNATURES/CONFIDENTIALITY: You and/or your care partner have signed paperwork which will be entered into your electronic medical record.  These signatures attest to the fact that that the information above on your After Visit Summary has been reviewed and is understood.  Full responsibility of the confidentiality of this discharge information lies with you and/or your care-partner.

## 2018-07-19 NOTE — Op Note (Signed)
[place] Patient Name: Kathleen Pope Procedure Date: 07/19/2018 1:49 PM MRN: [id number] Endoscopist: Docia Chuck. Henrene Pastor , MD Age: 84 Referring MD:  Date of Birth: [birth year] Gender: Female Account #: [id number] Procedure:                Colonoscopy, with biopsies Indications:              Clinically significant diarrhea of unexplained                            origin Medicines:                Monitored Anesthesia Care Procedure:                Pre-Anesthesia Assessment:                           - Prior to the procedure, a History and Physical                            was performed, and patient medications and                            allergies were reviewed. The patient's tolerance of                            previous anesthesia was also reviewed. The risks                            and benefits of the procedure and the sedation                            options and risks were discussed with the patient.                            All questions were answered, and informed consent                            was obtained. Prior Anticoagulants: The patient has                            taken no previous anticoagulant or antiplatelet                            agents. ASA Grade Assessment: II - A patient with                            mild systemic disease. After reviewing the risks                            and benefits, the patient was deemed in                            satisfactory condition to undergo the procedure.  After obtaining informed consent, the colonoscope                            was passed under direct vision. Throughout the                            procedure, the patient's blood pressure, pulse, and                            oxygen saturations were monitored continuously. The                            Colonoscope was introduced through the anus and                            advanced to the the cecum, identified by                      appendiceal orifice and ileocecal valve. The                            ileocecal valve, appendiceal orifice, and rectum                            were photographed. The quality of the bowel                            preparation was adequate. The colonoscopy was                            performed without difficulty. The patient tolerated                            the procedure well. The bowel preparation used was                            SUPREP. Scope In: 1:59:48 PM Scope Out: 2:26:20 PM Scope Withdrawal Time: 0 hours 19 minutes 34 seconds  Total Procedure Duration: 0 hours 26 minutes 32 seconds  Findings:                 The entire examined colon appeared normal on direct                            and retroflexion views. There was evidence of prior                            sigmoid colon resection. Biopsies for histology                            were taken with a cold forceps from the entire                            colon for evaluation of microscopic colitis. Complications:  No immediate complications. Estimated blood loss:                            None. Estimated Blood Loss:     Estimated blood loss: none. Impression:               - The entire examined colon is normal on direct and                            retroflexion views. Recommendation:           - Repeat colonoscopy is not recommended for                            surveillance.                           - Patient has a contact number available for                            emergencies. The signs and symptoms of potential                            delayed complications were discussed with the                            patient. Return to normal activities tomorrow.                            Written discharge instructions were provided to the                            patient.                           - Resume previous diet.                           - Continue present  medications.                           - Await pathology results. We will contact you with                            the results.                           - okay to use Imodium 1 or 2 three times daily for                            diarrhea Porchea Charrier N. Henrene Pastor, MD 07/19/2018 2:30:21 PM This report has been signed electronically.

## 2018-07-20 ENCOUNTER — Other Ambulatory Visit (HOSPITAL_COMMUNITY): Payer: Self-pay | Admitting: Internal Medicine

## 2018-07-20 ENCOUNTER — Telehealth: Payer: Self-pay | Admitting: *Deleted

## 2018-07-20 DIAGNOSIS — Z1231 Encounter for screening mammogram for malignant neoplasm of breast: Secondary | ICD-10-CM

## 2018-07-20 NOTE — Telephone Encounter (Signed)
  Follow up Call-  Call back number 07/19/2018  Post procedure Call Back phone  # [phone]  Permission to leave phone message Yes  Some recent data might be hidden     Patient questions:  Do you have a fever, pain , or abdominal swelling? No. Pain Score  0 *  Have you tolerated food without any problems? Yes.    Have you been able to return to your normal activities? Yes.    Do you have any questions about your discharge instructions: Diet   No. Medications  No. Follow up visit  No.  Do you have questions or concerns about your Care? Yes.    Actions: * If pain score is 4 or above: No action needed, pain <4.  Pt described a small amount of blood on the toilet tissue.  Advised her that this is normal and to call if symptoms worsen.

## 2018-07-21 ENCOUNTER — Other Ambulatory Visit: Payer: Self-pay

## 2018-07-21 ENCOUNTER — Encounter: Payer: Self-pay | Admitting: Internal Medicine

## 2018-07-21 DIAGNOSIS — R197 Diarrhea, unspecified: Secondary | ICD-10-CM

## 2018-07-21 MED ORDER — BUDESONIDE 3 MG PO CPEP: 9.0000 mg | ORAL_CAPSULE | Freq: Every day | ORAL | 1 refills | Status: DC

## 2018-08-08 DIAGNOSIS — Z23 Encounter for immunization: Secondary | ICD-10-CM | POA: Diagnosis not present

## 2018-08-18 ENCOUNTER — Ambulatory Visit (HOSPITAL_COMMUNITY)
Admission: RE | Admit: 2018-08-18 | Discharge: 2018-08-18 | Disposition: A | Discharge status: Home / Self Care | Payer: Medicare Other | Source: Ambulatory Visit | Attending: Internal Medicine | Admitting: Internal Medicine

## 2018-08-18 ENCOUNTER — Encounter (HOSPITAL_COMMUNITY): Payer: Self-pay

## 2018-08-18 DIAGNOSIS — Z1231 Encounter for screening mammogram for malignant neoplasm of breast: Secondary | ICD-10-CM | POA: Diagnosis not present

## 2018-08-31 ENCOUNTER — Encounter (INDEPENDENT_AMBULATORY_CARE_PROVIDER_SITE_OTHER): Payer: Self-pay

## 2018-08-31 ENCOUNTER — Ambulatory Visit: Payer: Medicare Other | Admitting: Internal Medicine

## 2018-08-31 ENCOUNTER — Encounter: Payer: Self-pay | Admitting: Internal Medicine

## 2018-08-31 VITALS — BP 142/84 | HR 90 | Wt 92.0 lb

## 2018-08-31 DIAGNOSIS — K52831 Collagenous colitis: Secondary | ICD-10-CM

## 2018-08-31 DIAGNOSIS — R197 Diarrhea, unspecified: Secondary | ICD-10-CM | POA: Diagnosis not present

## 2018-08-31 MED ORDER — BUDESONIDE 3 MG PO CPEP: 9.0000 mg | ORAL_CAPSULE | Freq: Every day | ORAL | 6 refills | Status: DC

## 2018-08-31 NOTE — Patient Instructions (Signed)
We have sent the following medications to your pharmacy for you to pick up at your convenience:  Budesonide  Take 9mg  (3 a day) until January 1.  Then take 6mg  (2 a day) until February 1.  Then take 3mg  (1 a day) until March 1.  Then stop.   Please follow up in 6 months (April)

## 2018-08-31 NOTE — Progress Notes (Signed)
HISTORY OF PRESENT ILLNESS:  Kathleen Pope is a 84 y.o. female with past medical history as listed below who presents today for follow-up regarding problems with diarrhea.  Last evaluated in this office June 09, 2018.  See that dictation.  Underwent colonoscopy July 19, 2018.  The entire colon appeared normal with evidence of prior surgery.  Random colon biopsies revealed collagenous colitis.  She was started on budesonide 9 mg daily.  She presents today for follow-up.  She is accompanied by her daughter.  Over the course of several weeks the patient's diarrhea resolved.  She continues on her medication.  Currently with formed bowel movements without urgency or diarrhea.  She is quite pleased.  No appreciable medication side effects.  REVIEW OF SYSTEMS:  All non-GI ROS negative entirely  Past Medical History:  Diagnosis Date  . Anemia    pt denies, yet takes iron  . Chronic airway obstruction, not elsewhere classified   . COPD (chronic obstructive pulmonary disease) ([place])   . Diverticulosis   . GI hemorrhage from post-treatment cecal ulcer 08/18/2012  . Hemorrhoids   . HTN (hypertension)   . Hypercholesteremia   . Lower GI bleed multiple   AVM's    Past Surgical History:  Procedure Laterality Date  . BUNIONECTOMY     left  . COLECTOMY    . COLONOSCOPY  08/10/2012   Procedure: COLONOSCOPY;  Surgeon: Ladene Artist, MD,FACG;  Location: [facility];  Service: Endoscopy;  Laterality: N/A;  . COLONOSCOPY  08/19/2012   Procedure: COLONOSCOPY;  Surgeon: Gatha Mayer, MD;  Location: [facility];  Service: Endoscopy;  Laterality: N/A;  . COLONOSCOPY N/A 03/11/2013   Procedure: COLONOSCOPY;  Surgeon: Irene Shipper, MD;  Location: [facility];  Service: Endoscopy;  Laterality: N/A;  . TONSILLECTOMY      Social History SHENIECE RUGGLES  reports that she has been smoking cigarettes. She has a 47.20 pack-year smoking history. She has never used smokeless tobacco. She reports that she does not  drink alcohol or use drugs.  family history includes Anuerysm in her brother; Breast cancer in her mother and sister; Lung cancer in her father.  No Known Allergies     PHYSICAL EXAMINATION: Vital signs: BP (!) 142/84   Pulse 90   Wt 92 lb (41.7 kg)   BMI 15.31 kg/m   Constitutional: Thin but otherwise generally well-appearing, no acute distress Psychiatric: alert and oriented x3, cooperative Abdomen: Not reexamined Neuro: Alert and oriented  ASSESSMENT:  1.  Collagenous colitis.  Diarrhea improved on budesonide   PLAN:  1.  Continue budesonide 9 mg daily until January 1.  Then decrease to 6 mg daily for 1 month, 3 mg daily for 1 month, then stop.   2.  Routine GI office follow-up 6 months.  Contact the office in the interim for any questions or problems   25 minutes spent face-to-face with the patient.  Greater than 50% the time used for counseling regarding her collagenous colitis, its etiology, treatment, and reviewing medication tapering strategy and follow-up

## 2018-09-20 ENCOUNTER — Ambulatory Visit (HOSPITAL_COMMUNITY)
Admission: RE | Admit: 2018-09-20 | Discharge: 2018-09-20 | Disposition: A | Discharge status: Home / Self Care | Payer: Medicare Other | Source: Ambulatory Visit | Attending: Family Medicine | Admitting: Family Medicine

## 2018-09-20 ENCOUNTER — Other Ambulatory Visit (HOSPITAL_COMMUNITY): Payer: Self-pay | Admitting: Family Medicine

## 2018-09-20 DIAGNOSIS — Z1389 Encounter for screening for other disorder: Secondary | ICD-10-CM | POA: Diagnosis not present

## 2018-09-20 DIAGNOSIS — J449 Chronic obstructive pulmonary disease, unspecified: Secondary | ICD-10-CM

## 2018-09-20 DIAGNOSIS — I700 Atherosclerosis of aorta: Secondary | ICD-10-CM | POA: Diagnosis not present

## 2018-09-20 DIAGNOSIS — J069 Acute upper respiratory infection, unspecified: Secondary | ICD-10-CM

## 2018-09-20 DIAGNOSIS — J439 Emphysema, unspecified: Secondary | ICD-10-CM | POA: Diagnosis not present

## 2018-09-20 DIAGNOSIS — Z681 Body mass index (BMI) 19 or less, adult: Secondary | ICD-10-CM | POA: Diagnosis not present

## 2018-10-30 DIAGNOSIS — I10 Essential (primary) hypertension: Secondary | ICD-10-CM | POA: Diagnosis not present

## 2018-10-30 DIAGNOSIS — Z1389 Encounter for screening for other disorder: Secondary | ICD-10-CM | POA: Diagnosis not present

## 2018-10-30 DIAGNOSIS — Z0001 Encounter for general adult medical examination with abnormal findings: Secondary | ICD-10-CM | POA: Diagnosis not present

## 2018-10-30 DIAGNOSIS — J449 Chronic obstructive pulmonary disease, unspecified: Secondary | ICD-10-CM | POA: Diagnosis not present

## 2018-10-30 DIAGNOSIS — Z681 Body mass index (BMI) 19 or less, adult: Secondary | ICD-10-CM | POA: Diagnosis not present

## 2019-03-06 ENCOUNTER — Ambulatory Visit: Payer: Medicare Other | Admitting: Internal Medicine

## 2019-08-24 ENCOUNTER — Other Ambulatory Visit (HOSPITAL_COMMUNITY): Payer: Self-pay | Admitting: Internal Medicine

## 2019-08-24 DIAGNOSIS — Z1231 Encounter for screening mammogram for malignant neoplasm of breast: Secondary | ICD-10-CM

## 2019-08-27 DIAGNOSIS — Z23 Encounter for immunization: Secondary | ICD-10-CM | POA: Diagnosis not present

## 2019-09-05 ENCOUNTER — Ambulatory Visit (HOSPITAL_COMMUNITY)
Admission: RE | Admit: 2019-09-05 | Discharge: 2019-09-05 | Disposition: A | Discharge status: Home / Self Care | Payer: Medicare Other | Source: Ambulatory Visit | Attending: Internal Medicine | Admitting: Internal Medicine

## 2019-09-05 ENCOUNTER — Other Ambulatory Visit: Payer: Self-pay

## 2019-09-05 DIAGNOSIS — Z1231 Encounter for screening mammogram for malignant neoplasm of breast: Secondary | ICD-10-CM | POA: Insufficient documentation

## 2019-11-01 DIAGNOSIS — Z681 Body mass index (BMI) 19 or less, adult: Secondary | ICD-10-CM | POA: Diagnosis not present

## 2019-11-01 DIAGNOSIS — I10 Essential (primary) hypertension: Secondary | ICD-10-CM | POA: Diagnosis not present

## 2019-11-01 DIAGNOSIS — J449 Chronic obstructive pulmonary disease, unspecified: Secondary | ICD-10-CM | POA: Diagnosis not present

## 2019-11-01 DIAGNOSIS — Z0000 Encounter for general adult medical examination without abnormal findings: Secondary | ICD-10-CM | POA: Diagnosis not present

## 2019-11-01 DIAGNOSIS — Z1389 Encounter for screening for other disorder: Secondary | ICD-10-CM | POA: Diagnosis not present

## 2019-11-12 ENCOUNTER — Other Ambulatory Visit (HOSPITAL_COMMUNITY): Payer: Self-pay | Admitting: Internal Medicine

## 2019-11-12 ENCOUNTER — Other Ambulatory Visit: Payer: Self-pay | Admitting: Internal Medicine

## 2019-11-12 DIAGNOSIS — I6523 Occlusion and stenosis of bilateral carotid arteries: Secondary | ICD-10-CM

## 2019-11-15 ENCOUNTER — Ambulatory Visit (HOSPITAL_COMMUNITY)
Admission: RE | Admit: 2019-11-15 | Discharge: 2019-11-15 | Disposition: A | Discharge status: Home / Self Care | Payer: Medicare Other | Source: Ambulatory Visit | Attending: Internal Medicine | Admitting: Internal Medicine

## 2019-11-15 ENCOUNTER — Other Ambulatory Visit: Payer: Self-pay

## 2019-11-15 DIAGNOSIS — I6523 Occlusion and stenosis of bilateral carotid arteries: Secondary | ICD-10-CM | POA: Insufficient documentation

## 2020-01-31 ENCOUNTER — Telehealth: Payer: Self-pay | Admitting: Internal Medicine

## 2020-01-31 NOTE — Telephone Encounter (Signed)
Okay to refill budesonide.  Start 9 mg daily and stay on that dose until she sees me in follow-up.  She should follow-up with me in 4 to 6 weeks.  Thanks

## 2020-01-31 NOTE — Telephone Encounter (Signed)
Spoke with pt and she is aware, script sent to pharmacy. Pt scheduled to see Dr. Henrene Pastor 03/12/20@10am . Pt aware of appt.

## 2020-01-31 NOTE — Telephone Encounter (Signed)
Pt states she was diagnosed with collagenous colitis in 2019 by Dr. Henrene Pastor and was prescribed budesonide. Pt reports that her diarrhea symptoms are back, states she has had diarrhea for about a week. Patient is requesting refill on budesonide. Please advise.

## 2020-02-01 ENCOUNTER — Other Ambulatory Visit: Payer: Self-pay | Admitting: Internal Medicine

## 2020-02-15 DIAGNOSIS — H5213 Myopia, bilateral: Secondary | ICD-10-CM | POA: Diagnosis not present

## 2020-03-03 ENCOUNTER — Ambulatory Visit: Payer: Medicare Other | Admitting: Dermatology

## 2020-03-12 ENCOUNTER — Other Ambulatory Visit: Payer: Self-pay

## 2020-03-12 ENCOUNTER — Ambulatory Visit: Payer: Medicare Other | Admitting: Internal Medicine

## 2020-03-12 ENCOUNTER — Encounter: Payer: Self-pay | Admitting: Internal Medicine

## 2020-03-12 VITALS — BP 128/76 | HR 74 | Temp 97.7°F | Ht 61.5 in | Wt 90.8 lb

## 2020-03-12 DIAGNOSIS — K52831 Collagenous colitis: Secondary | ICD-10-CM

## 2020-03-12 MED ORDER — BUDESONIDE 3 MG PO CPEP: ORAL_CAPSULE | ORAL | 1 refills | Status: DC

## 2020-03-12 NOTE — Progress Notes (Signed)
HISTORY OF PRESENT ILLNESS:  Kathleen Pope is a 86 y.o. female with past medical history as listed below who presents today regarding recent problems with recurrent diarrhea felt secondary to collagenous colitis.  She was diagnosed with collagenous colitis September 2019 when she underwent colonoscopy to evaluate clinically significant diarrhea of unexplained origin.  She was placed on budesonide which was tapered over the course of about 4 months.  She did well until 6 weeks ago when she contacted the office complaining of recurrent diarrhea.  Typically has 1 bowel movement per day.  Was having 4 diarrheal stools per day which did not respond to Imodium.  She was reinitiated on budesonide 9 mg daily.  She has continued on that dose until this follow-up.  She is accompanied today by her daughter.  After about 3 weeks she reports significant improvement in her symptoms.  Currently experiencing 1 bowel movement per day which is only slightly loose.  No blood.  No abdominal pain.  No other complaints.  She has received her Covid vaccination series  REVIEW OF SYSTEMS:  All non-GI ROS negative unless otherwise stated in the HPI except for urinary leakage  Past Medical History:  Diagnosis Date  . Anemia    pt denies, yet takes iron  . Chronic airway obstruction, not elsewhere classified   . COPD (chronic obstructive pulmonary disease) ([place])   . Diverticulosis   . GI hemorrhage from post-treatment cecal ulcer 08/18/2012  . Hemorrhoids   . HTN (hypertension)   . Hypercholesteremia   . Lower GI bleed multiple   AVM's    Past Surgical History:  Procedure Laterality Date  . BUNIONECTOMY     left  . COLECTOMY    . COLONOSCOPY  08/10/2012   Procedure: COLONOSCOPY;  Surgeon: Ladene Artist, MD,FACG;  Location: [facility];  Service: Endoscopy;  Laterality: N/A;  . COLONOSCOPY  08/19/2012   Procedure: COLONOSCOPY;  Surgeon: Gatha Mayer, MD;  Location: [facility];  Service: Endoscopy;  Laterality:  N/A;  . COLONOSCOPY N/A 03/11/2013   Procedure: COLONOSCOPY;  Surgeon: Irene Shipper, MD;  Location: [facility];  Service: Endoscopy;  Laterality: N/A;  . TONSILLECTOMY      Social History Kathleen Pope  reports that she has been smoking cigarettes. She has a 47.20 pack-year smoking history. She has never used smokeless tobacco. She reports that she does not drink alcohol or use drugs.  family history includes Anuerysm in her brother; Breast cancer in her mother and sister; Lung cancer in her father.  No Known Allergies     PHYSICAL EXAMINATION: Vital signs: BP 128/76   Pulse 74   Temp 97.7 F (36.5 C)   Ht 5' 1.5" (1.562 m)   Wt 90 lb 12.8 oz (41.2 kg)   BMI 16.88 kg/m   Constitutional: Thin but generally well-appearing, no acute distress Psychiatric: alert and oriented x3, cooperative Eyes: extraocular movements intact, anicteric, conjunctiva pink Mouth: oral pharynx moist, no lesions Neck: supple no lymphadenopathy Cardiovascular: heart regular rate and rhythm, no murmur Lungs: clear to auscultation bilaterally Abdomen: soft, nontender, nondistended, no obvious ascites, no peritoneal signs, normal bowel sounds, no organomegaly Rectal: Omitted Extremities: no clubbing, cyanosis, or lower extremity edema bilaterally Skin: no lesions on visible extremities Neuro: No focal deficits.  Cranial nerves intact  ASSESSMENT:  1.  Collagenous colitis.  Recent flare with associated diarrhea.  Has responded to budesonide 2.  History of bleeding AVMs of the colon   PLAN:  1.  Continue budesonide 9 mg daily for an additional 2 weeks then begin to taper by 3 mg monthly until off.  Contact the office in the interim for questions or problems.  Her prescription has been refilled.  Medication risks reviewed.

## 2020-03-12 NOTE — Patient Instructions (Signed)
We have sent the following medications to your pharmacy for you to pick up at your convenience:  Budesonide

## 2020-03-14 DIAGNOSIS — J449 Chronic obstructive pulmonary disease, unspecified: Secondary | ICD-10-CM | POA: Diagnosis not present

## 2020-03-14 DIAGNOSIS — Z720 Tobacco use: Secondary | ICD-10-CM | POA: Diagnosis not present

## 2020-03-14 DIAGNOSIS — I10 Essential (primary) hypertension: Secondary | ICD-10-CM | POA: Diagnosis not present

## 2020-03-14 DIAGNOSIS — I6522 Occlusion and stenosis of left carotid artery: Secondary | ICD-10-CM | POA: Diagnosis not present

## 2020-05-07 DIAGNOSIS — Z681 Body mass index (BMI) 19 or less, adult: Secondary | ICD-10-CM | POA: Diagnosis not present

## 2020-05-07 DIAGNOSIS — M66811 Spontaneous rupture of other tendons, right shoulder: Secondary | ICD-10-CM | POA: Diagnosis not present

## 2020-05-07 DIAGNOSIS — J449 Chronic obstructive pulmonary disease, unspecified: Secondary | ICD-10-CM | POA: Diagnosis not present

## 2020-05-07 DIAGNOSIS — I10 Essential (primary) hypertension: Secondary | ICD-10-CM | POA: Diagnosis not present

## 2020-05-14 DIAGNOSIS — I10 Essential (primary) hypertension: Secondary | ICD-10-CM | POA: Diagnosis not present

## 2020-05-14 DIAGNOSIS — J449 Chronic obstructive pulmonary disease, unspecified: Secondary | ICD-10-CM | POA: Diagnosis not present

## 2020-05-14 DIAGNOSIS — Z720 Tobacco use: Secondary | ICD-10-CM | POA: Diagnosis not present

## 2020-05-14 DIAGNOSIS — I6523 Occlusion and stenosis of bilateral carotid arteries: Secondary | ICD-10-CM | POA: Diagnosis not present

## 2020-07-15 DIAGNOSIS — J449 Chronic obstructive pulmonary disease, unspecified: Secondary | ICD-10-CM | POA: Diagnosis not present

## 2020-07-15 DIAGNOSIS — I6523 Occlusion and stenosis of bilateral carotid arteries: Secondary | ICD-10-CM | POA: Diagnosis not present

## 2020-07-15 DIAGNOSIS — Z720 Tobacco use: Secondary | ICD-10-CM | POA: Diagnosis not present

## 2020-07-15 DIAGNOSIS — I10 Essential (primary) hypertension: Secondary | ICD-10-CM | POA: Diagnosis not present

## 2020-07-28 ENCOUNTER — Other Ambulatory Visit (HOSPITAL_COMMUNITY): Payer: Self-pay | Admitting: Internal Medicine

## 2020-07-28 DIAGNOSIS — Z1231 Encounter for screening mammogram for malignant neoplasm of breast: Secondary | ICD-10-CM

## 2020-09-05 ENCOUNTER — Ambulatory Visit (HOSPITAL_COMMUNITY): Payer: Medicare Other

## 2020-09-13 DIAGNOSIS — E7849 Other hyperlipidemia: Secondary | ICD-10-CM | POA: Diagnosis not present

## 2020-09-13 DIAGNOSIS — I10 Essential (primary) hypertension: Secondary | ICD-10-CM | POA: Diagnosis not present

## 2020-09-17 ENCOUNTER — Ambulatory Visit (HOSPITAL_COMMUNITY)
Admission: RE | Admit: 2020-09-17 | Discharge: 2020-09-17 | Disposition: A | Discharge status: Home / Self Care | Payer: Medicare Other | Source: Ambulatory Visit | Attending: Internal Medicine | Admitting: Internal Medicine

## 2020-09-17 ENCOUNTER — Other Ambulatory Visit: Payer: Self-pay

## 2020-09-17 DIAGNOSIS — Z1231 Encounter for screening mammogram for malignant neoplasm of breast: Secondary | ICD-10-CM | POA: Diagnosis not present

## 2020-09-18 ENCOUNTER — Other Ambulatory Visit: Payer: Self-pay

## 2020-09-18 ENCOUNTER — Other Ambulatory Visit (INDEPENDENT_AMBULATORY_CARE_PROVIDER_SITE_OTHER): Payer: Medicare Other

## 2020-09-18 ENCOUNTER — Telehealth: Payer: Self-pay | Admitting: Internal Medicine

## 2020-09-18 DIAGNOSIS — R718 Other abnormality of red blood cells: Secondary | ICD-10-CM

## 2020-09-18 DIAGNOSIS — K921 Melena: Secondary | ICD-10-CM

## 2020-09-18 LAB — CBC WITH DIFFERENTIAL/PLATELET
Basophils Absolute: 0 10*3/uL (ref 0.0–0.1)
Basophils Relative: 0.2 % (ref 0.0–3.0)
Eosinophils Absolute: 0.1 10*3/uL (ref 0.0–0.7)
Eosinophils Relative: 1.5 % (ref 0.0–5.0)
HCT: 33.2 % — ABNORMAL LOW (ref 36.0–46.0)
Hemoglobin: 11.5 g/dL — ABNORMAL LOW (ref 12.0–15.0)
Lymphocytes Relative: 16.1 % (ref 12.0–46.0)
Lymphs Abs: 1.6 10*3/uL (ref 0.7–4.0)
MCHC: 34.8 g/dL (ref 30.0–36.0)
MCV: 103.5 fl — ABNORMAL HIGH (ref 78.0–100.0)
Monocytes Absolute: 1.1 10*3/uL — ABNORMAL HIGH (ref 0.1–1.0)
Monocytes Relative: 11.2 % (ref 3.0–12.0)
Neutro Abs: 7 10*3/uL (ref 1.4–7.7)
Neutrophils Relative %: 71 % (ref 43.0–77.0)
Platelets: 395 10*3/uL (ref 150.0–400.0)
RBC: 3.21 Mil/uL — ABNORMAL LOW (ref 3.87–5.11)
RDW: 13.8 % (ref 11.5–15.5)
WBC: 9.8 10*3/uL (ref 4.0–10.5)

## 2020-09-18 NOTE — Telephone Encounter (Signed)
Patient called states she is having blood come out when she has a BM started happening today please advise

## 2020-09-18 NOTE — Telephone Encounter (Signed)
1.  Keep a close eye on this 2.  She just had colonoscopy in 2019.  I do not think she needs a repeat colonoscopy just yet. 3.  Add Metamucil 2 tablespoons daily 4.  For persistent significant recurrent bleeding, proceed to the emergency room for evaluation 5.  Obtain CBC 6.  Have her give [place] a clinical update tomorrow

## 2020-09-18 NOTE — Telephone Encounter (Signed)
Pt states this morning she had a BM and there was bright red blood on/in the stool and on the toilet tissue when she wiped. She passes a little more stool later this am and there was more on the toilet tissue this time. Pt states it does not color the toilet bowl red. Reports she has had this happen before. States she has not been constipated recently at all. Pt calling wanting to schedule a colonoscopy, reports she had had this happen in the past. Please advise.

## 2020-09-18 NOTE — Telephone Encounter (Signed)
Spoke with pt and went over Dr. Blanch Media recommendations step by step with pt and she repeated them. Pt knows to come for lab and call with update tomorrow.

## 2020-09-20 ENCOUNTER — Emergency Department (HOSPITAL_COMMUNITY)
Admission: EM | Admit: 2020-09-20 | Discharge: 2020-09-20 | Disposition: A | Discharge status: Home / Self Care | Payer: Medicare Other | Attending: Emergency Medicine | Admitting: Emergency Medicine

## 2020-09-20 ENCOUNTER — Other Ambulatory Visit: Payer: Self-pay

## 2020-09-20 ENCOUNTER — Encounter (HOSPITAL_COMMUNITY): Payer: Self-pay | Admitting: Emergency Medicine

## 2020-09-20 ENCOUNTER — Emergency Department (HOSPITAL_COMMUNITY): Payer: Medicare Other

## 2020-09-20 DIAGNOSIS — J90 Pleural effusion, not elsewhere classified: Secondary | ICD-10-CM

## 2020-09-20 DIAGNOSIS — Z7982 Long term (current) use of aspirin: Secondary | ICD-10-CM | POA: Diagnosis not present

## 2020-09-20 DIAGNOSIS — J181 Lobar pneumonia, unspecified organism: Secondary | ICD-10-CM | POA: Insufficient documentation

## 2020-09-20 DIAGNOSIS — J918 Pleural effusion in other conditions classified elsewhere: Secondary | ICD-10-CM | POA: Diagnosis not present

## 2020-09-20 DIAGNOSIS — R059 Cough, unspecified: Secondary | ICD-10-CM | POA: Diagnosis not present

## 2020-09-20 DIAGNOSIS — Z20822 Contact with and (suspected) exposure to covid-19: Secondary | ICD-10-CM | POA: Insufficient documentation

## 2020-09-20 DIAGNOSIS — R0602 Shortness of breath: Secondary | ICD-10-CM | POA: Diagnosis not present

## 2020-09-20 DIAGNOSIS — I10 Essential (primary) hypertension: Secondary | ICD-10-CM | POA: Diagnosis not present

## 2020-09-20 DIAGNOSIS — J439 Emphysema, unspecified: Secondary | ICD-10-CM | POA: Diagnosis not present

## 2020-09-20 DIAGNOSIS — J449 Chronic obstructive pulmonary disease, unspecified: Secondary | ICD-10-CM | POA: Diagnosis not present

## 2020-09-20 DIAGNOSIS — F17210 Nicotine dependence, cigarettes, uncomplicated: Secondary | ICD-10-CM | POA: Diagnosis not present

## 2020-09-20 DIAGNOSIS — J189 Pneumonia, unspecified organism: Secondary | ICD-10-CM | POA: Diagnosis not present

## 2020-09-20 DIAGNOSIS — Z79899 Other long term (current) drug therapy: Secondary | ICD-10-CM | POA: Diagnosis not present

## 2020-09-20 LAB — COMPREHENSIVE METABOLIC PANEL
ALT: 14 U/L (ref 0–44)
AST: 17 U/L (ref 15–41)
Albumin: 3.2 g/dL — ABNORMAL LOW (ref 3.5–5.0)
Alkaline Phosphatase: 58 U/L (ref 38–126)
Anion gap: 9 (ref 5–15)
BUN: 12 mg/dL (ref 8–23)
CO2: 28 mmol/L (ref 22–32)
Calcium: 9.4 mg/dL (ref 8.9–10.3)
Chloride: 105 mmol/L (ref 98–111)
Creatinine, Ser: 0.67 mg/dL (ref 0.44–1.00)
GFR, Estimated: >60 mL/min (ref >60)
Glucose, Bld: 118 mg/dL — ABNORMAL HIGH (ref 70–99)
Potassium: 3.8 mmol/L (ref 3.5–5.1)
Sodium: 142 mmol/L (ref 135–145)
Total Bilirubin: 0.5 mg/dL (ref 0.3–1.2)
Total Protein: 6.4 g/dL — ABNORMAL LOW (ref 6.5–8.1)

## 2020-09-20 LAB — RESPIRATORY PANEL BY RT PCR (FLU A&B, COVID)
Influenza A by PCR: NEGATIVE
Influenza B by PCR: NEGATIVE
SARS Coronavirus 2 by RT PCR: NEGATIVE

## 2020-09-20 LAB — CBC WITH DIFFERENTIAL/PLATELET
Abs Immature Granulocytes: 0.03 10*3/uL (ref 0.00–0.07)
Basophils Absolute: 0 10*3/uL (ref 0.0–0.1)
Basophils Relative: 0 %
Eosinophils Absolute: 0.3 10*3/uL (ref 0.0–0.5)
Eosinophils Relative: 4 %
HCT: 32.9 % — ABNORMAL LOW (ref 36.0–46.0)
Hemoglobin: 11 g/dL — ABNORMAL LOW (ref 12.0–15.0)
Immature Granulocytes: 0 %
Lymphocytes Relative: 13 %
Lymphs Abs: 1.1 10*3/uL (ref 0.7–4.0)
MCH: 32.4 pg (ref 26.0–34.0)
MCHC: 33.4 g/dL (ref 30.0–36.0)
MCV: 97.1 fL (ref 80.0–100.0)
Monocytes Absolute: 0.8 10*3/uL (ref 0.1–1.0)
Monocytes Relative: 10 %
Neutro Abs: 6 10*3/uL (ref 1.7–7.7)
Neutrophils Relative %: 73 %
Platelets: 489 10*3/uL — ABNORMAL HIGH (ref 150–400)
RBC: 3.39 MIL/uL — ABNORMAL LOW (ref 3.87–5.11)
RDW: 13.4 % (ref 11.5–15.5)
WBC: 8.3 10*3/uL (ref 4.0–10.5)
nRBC: 0 % (ref 0.0–0.2)

## 2020-09-20 MED ORDER — ALBUTEROL SULFATE HFA 108 (90 BASE) MCG/ACT IN AERS: 2.0000 | INHALATION_SPRAY | Freq: Once | RESPIRATORY_TRACT | Status: DC

## 2020-09-20 MED ORDER — IPRATROPIUM BROMIDE HFA 17 MCG/ACT IN AERS
2.0000 | INHALATION_SPRAY | Freq: Once | RESPIRATORY_TRACT | Status: DC
  Filled 2020-09-20: qty 12.9

## 2020-09-20 MED ORDER — DOXYCYCLINE HYCLATE 100 MG PO CAPS: 100.0000 mg | ORAL_CAPSULE | Freq: Two times a day (BID) | ORAL | 0 refills | Status: AC

## 2020-09-20 MED ORDER — AEROCHAMBER PLUS FLO-VU SMALL MISC
1.0000 | Freq: Once | Status: DC
  Filled 2020-09-20: qty 1

## 2020-09-20 MED ORDER — FUROSEMIDE 20 MG PO TABS: 20.0000 mg | ORAL_TABLET | Freq: Every day | ORAL | 0 refills | Status: DC

## 2020-09-20 NOTE — Discharge Instructions (Addendum)
You were seen and evaluated in the emergency department for your cough, and shortness of breath.  You tested negative for COVID-19, influenza A/B.  This is good news! Unfortunately, your chest x-ray revealed the changes concerning for pneumonia in your left lung, as well as some surrounding fluid in your left lung base, called a pleural effusion.  Some patients do require admission to the hospital for treatment of this kind of infection, however your vital signs were very reassuring and remained normal throughout your stay in the emergency department, as well as when you were walking around.  For this reason we feel you are safe to go home and be treated outpatient.  You have been prescribed a course of antibiotics called doxycycline; it will be important that you take these for the entire duration of 1 week.  Additionally you can call within 3 days of fluid pill called Lasix, which will help remove the fluid from your lungs.  Please be aware that this medication will make you urinate more frequently.  Please follow-up within the next week with your primary care doctor, so that he may reevaluate you.  Please follow-up with your gastroenterologist, Dr. Henrene Pastor in the next few days as well.  We rechecked your hemoglobin while you were here, and while it was a bit lower today than it was 2 days ago at his office, it is not dangerous for you to go home.  Please follow-up with him so that he may continue to monitor this blood level.  Please return to the emergency department if develop your symptoms worsen or do not improve despite antibiotic therapy.  For example you should return here if you develop any worsening shortness of breath, new chest pain or palpitations, nausea or vomiting that does not stop, or any other new severe symptoms.

## 2020-09-20 NOTE — ED Notes (Signed)
Pt ambulated in hallway. Oxygen stayed at 93-94% on room air. Tolerated well.

## 2020-09-20 NOTE — ED Triage Notes (Signed)
Pt c/o of sob and cough x 3 days

## 2020-09-20 NOTE — ED Provider Notes (Signed)
[facility] EMERGENCY DEPARTMENT Provider Note   CSN: [id number] Arrival date & time: 09/20/20  0846     History Chief Complaint  Patient presents with  . Shortness of Breath    Kathleen Pope is a 86 y.o. female who presents with concern for 3 days of cough, chest tightness, feeling short of breath.  Patient has history of COPD, but is not on any medications at home, is not on supplemental oxygen at home. She denies chest pain, but endorses chest tightness, discomfort.  That 3 days ago she started coughing very persistently, coughing up thick yellow mucus.  She states that now she is coughing up more watery clear to yellow mucus.  She states that she is coughing constantly, inhibiting her ability to sleep at night.  She feels short of breath.  She endorses impaired sense of taste and smell, states that has been happening for years secondary to her smoking, but "has gotten worse this week".  States she is eating and drinking normally for her at this time.  Patient did receive 2 doses of COVID-19 vaccination in January, February 2021.  She denies fevers, chills at home, denies abdominal pain, nausea, vomiting, diarrhea.  She states she has history of GI bleeding secondary to AVM of the colon, states that on Monday she had 1 episode of blood in her stool, "a large amount" of blood on the toilet tissue.  She states that she call to Dr. Henrene Pastor in [place], her gastroenterologist, who had her presented to their office for basic blood work.  They stated that her hemoglobin was low at that time, but not critically so.  States that she has not had any further bleeding per rectum, or blood in her stools.  Normal brown bowel movements, without hematochezia or melena since Monday.  Also endorse history of hemorrhoids, feels that this was likely the source of her symptoms.  I personally reviewed the patient's medical records.  His history of COPD, hyperlipidemia, chronic lower GI bleeding secondary to AVM  of the colon (chronically on iron supplementation), cecal ulcer, hemorrhoids, hypertension and known existing heart murmur.  Current smoker, 1 pack/day.  CBC on 09/18/2020 with microcytic anemia, hemoglobin 11.5, MCV 103.5.  HPI     Past Medical History:  Diagnosis Date  . Anemia    pt denies, yet takes iron  . Chronic airway obstruction, not elsewhere classified   . COPD (chronic obstructive pulmonary disease) ([place])   . Diverticulosis   . GI hemorrhage from post-treatment cecal ulcer 08/18/2012  . Hemorrhoids   . HTN (hypertension)   . Hypercholesteremia   . Lower GI bleed multiple   AVM's    Patient Active Problem List   Diagnosis Date Noted  . Loose stools 04/06/2017  . De Quervain's disease (tenosynovitis) 03/14/2014  . Rectal bleeding 03/09/2013  . Hematochezia 03/09/2013  . External hemorrhoids 03/08/2013  . Chronic lower GI bleeding 08/18/2012  . GI hemorrhage from post-treatment cecal ulcer 08/18/2012  . AVM (arteriovenous malformation) of colon with hemorrhage 08/18/2012  . HTN (hypertension) 08/18/2012  . Acute blood loss anemia 08/18/2012  . Bruit 08/04/2012  . Mixed hyperlipidemia 08/04/2012  . Smoker 08/04/2012  . Murmur 08/04/2012    Past Surgical History:  Procedure Laterality Date  . BUNIONECTOMY     left  . COLECTOMY    . COLONOSCOPY  08/10/2012   Procedure: COLONOSCOPY;  Surgeon: Ladene Artist, MD,FACG;  Location: [facility];  Service: Endoscopy;  Laterality: N/A;  . COLONOSCOPY  08/19/2012   Procedure: COLONOSCOPY;  Surgeon: Gatha Mayer, MD;  Location: [facility];  Service: Endoscopy;  Laterality: N/A;  . COLONOSCOPY N/A 03/11/2013   Procedure: COLONOSCOPY;  Surgeon: Irene Shipper, MD;  Location: [facility];  Service: Endoscopy;  Laterality: N/A;  . TONSILLECTOMY       OB History   No obstetric history on file.     Family History  Problem Relation Age of Onset  . Breast cancer Mother   . Lung cancer Father   . Breast cancer  Sister   . Anuerysm Brother        in stomach  . Colon cancer Neg Hx   . Esophageal cancer Neg Hx   . Rectal cancer Neg Hx     Social History   Tobacco Use  . Smoking status: Current Every Day Smoker    Packs/day: 0.80    Years: 59.00    Pack years: 47.20    Types: Cigarettes  . Smokeless tobacco: Never Used  . Tobacco comment: tobacco info given 06/09/18  Vaping Use  . Vaping Use: Never used  Substance Use Topics  . Alcohol use: No  . Drug use: No    Home Medications Prior to Admission medications   Medication Sig Start Date End Date Taking? Authorizing Provider  acetaminophen (TYLENOL) 500 MG tablet Take 1,000 mg by mouth every 6 (six) hours as needed. Pain    [provider]  amLODipine (NORVASC) 5 MG tablet Take 5 mg by mouth daily.    [provider]  aspirin EC 81 MG tablet Take 81 mg by mouth every other day.    [provider]  budesonide (ENTOCORT EC) 3 MG 24 hr capsule Take 9mg  for one month; then 6 mg for one month; then 3 mg for one month; then stop 03/12/20   Irene Shipper, MD  calcium carbonate (OS-CAL) 600 MG TABS Take 600 mg by mouth daily.    [provider]  doxycycline (VIBRAMYCIN) 100 MG capsule Take 1 capsule (100 mg total) by mouth 2 (two) times daily for 7 days. 09/20/20 09/27/20  Karma Hiney, Eugene Garnet R, PA-C  ECHINACEA PO Take 1 capsule by mouth daily.    [provider]  ferrous sulfate 325 (65 FE) MG EC tablet Take 325 mg by mouth 2 (two) times daily.     [provider]  fish oil-omega-3 fatty acids 1000 MG capsule Take 1 g by mouth daily.    [provider]  furosemide (LASIX) 20 MG tablet Take 1 tablet (20 mg total) by mouth daily for 3 days. 09/20/20 09/23/20  Rahiem Schellinger, Eugene Garnet R, PA-C  Multiple Vitamin (MULTIVITAMIN) tablet Take 1 tablet by mouth daily.    [provider]  pravastatin (PRAVACHOL) 10 MG tablet Take 10 mg by mouth daily.    [provider]    Allergies     Patient has no known allergies.  Review of Systems   Review of Systems  Constitutional: Positive for fatigue. Negative for activity change, appetite change, chills, diaphoresis and fever.  HENT: Negative for trouble swallowing and voice change.   Eyes: Positive for discharge. Negative for visual disturbance.       Watery discharge from both eyes.  Respiratory: Positive for cough, chest tightness and shortness of breath. Negative for stridor.   Cardiovascular: Negative for chest pain, palpitations and leg swelling.  Gastrointestinal: Positive for blood in stool. Negative for abdominal pain, diarrhea, nausea and vomiting.  Genitourinary: Negative for difficulty  urinating, dysuria, frequency and urgency.  Musculoskeletal: Negative.   Skin: Negative.   Neurological: Negative for dizziness, syncope, weakness, light-headedness and headaches.  Hematological: Bruises/bleeds easily.       AVM near the colon, history of GI bleeding.  Psychiatric/Behavioral: Negative.     Physical Exam Updated Vital Signs BP (!) 168/90   Pulse 86   Temp 98.8 F (37.1 C) (Oral)   Resp 16   Ht 5\' 1"  (1.549 m)   Wt 40.8 kg   SpO2 97%   BMI 17.01 kg/m   Physical Exam Vitals and nursing note reviewed.  Constitutional:      General: She is awake.     Appearance: She is well-groomed. She is cachectic.  HENT:     Head: Normocephalic and atraumatic.     Nose: Rhinorrhea present. Rhinorrhea is clear.     Mouth/Throat:     Mouth: Mucous membranes are moist.     Pharynx: Oropharynx is clear. Uvula midline. No oropharyngeal exudate or posterior oropharyngeal erythema.  Eyes:     General:        Right eye: No discharge.        Left eye: No discharge.     Extraocular Movements: Extraocular movements intact.     Conjunctiva/sclera: Conjunctivae normal.     Pupils: Pupils are equal, round, and reactive to light.     Comments: Watering of the eyes bilaterally.  Neck:     Trachea: Phonation normal.    Cardiovascular:     Rate and Rhythm: Normal rate and regular rhythm.     Pulses: Normal pulses.          Radial pulses are 2+ on the right side and 2+ on the left side.       Dorsalis pedis pulses are 2+ on the right side and 2+ on the left side.     Heart sounds: Murmur heard.  Systolic murmur is present with a grade of 4/6.   Pulmonary:     Effort: Pulmonary effort is normal. Prolonged expiration present. No tachypnea, accessory muscle usage, respiratory distress or retractions.     Breath sounds: Examination of the left-middle field reveals wheezing and rales. Examination of the left-lower field reveals decreased breath sounds and rales. Decreased breath sounds, wheezing and rales present. No rhonchi.  Abdominal:     General: There is no distension.     Tenderness: There is no abdominal tenderness. There is no guarding or rebound.  Musculoskeletal:        General: No deformity.     Cervical back: Neck supple. No muscular tenderness.     Right lower leg: No edema.     Left lower leg: No edema.  Lymphadenopathy:     Cervical: No cervical adenopathy.  Skin:    General: Skin is warm and dry.     Capillary Refill: Capillary refill takes 2 to 3 seconds.  Neurological:     Mental Status: She is alert and oriented to person, place, and time.  Psychiatric:        Mood and Affect: Mood normal.        Behavior: Behavior is cooperative.     ED Results / Procedures / Treatments   Labs (all labs ordered are listed, but only abnormal results are displayed) Labs Reviewed  CBC WITH DIFFERENTIAL/PLATELET - Abnormal; Notable for the following components:      Result Value   RBC 3.39 (*)    Hemoglobin 11.0 (*)    HCT  32.9 (*)    Platelets 489 (*)    All other components within normal limits  COMPREHENSIVE METABOLIC PANEL - Abnormal; Notable for the following components:   Glucose, Bld 118 (*)    Total Protein 6.4 (*)    Albumin 3.2 (*)    All other components within normal limits   RESPIRATORY PANEL BY RT PCR (FLU A&B, COVID)    EKG EKG Interpretation  Date/Time:  Saturday September 20 2020 09:19:02 EDT Ventricular Rate:  89 PR Interval:    QRS Duration: 95 QT Interval:  366 QTC Calculation: 446 R Axis:   59 Text Interpretation: Normal sinus rhythm LVH with secondary repolarization abnormality Baseline wander in lead(s) V1 V3 No STEMI Confirmed by Nanda Quinton [phone]) on 09/20/2020 10:29:22 AM   Radiology DG Chest Portable 1 View  Result Date: 09/20/2020 CLINICAL DATA:  86 year old female with history of shortness of breath and cough for the past 3 days. History of COPD. EXAM: PORTABLE CHEST 1 VIEW COMPARISON:  Chest x-ray 09/20/2018. FINDINGS: Moderate left pleural effusion with opacity at the left lung base which may reflect atelectasis and/or consolidation. Mild diffuse interstitial prominence and peribronchial cuffing with emphysematous changes, similar to prior studies. No pneumothorax. No evidence of pulmonary edema. Heart size is normal. Upper mediastinal contours are within normal limits. Aortic atherosclerosis. IMPRESSION: 1. Atelectasis and/or consolidation in the left lung base with moderate left pleural effusion. 2. Emphysema. 3. Aortic atherosclerosis. Electronically Signed   By: Vinnie Langton M.D.   On: 09/20/2020 11:05    Procedures Procedures (including critical care time)  Medications Ordered in ED Medications - No data to display  ED Course  I have reviewed the triage vital signs and the nursing notes.  Pertinent labs & imaging results that were available during my care of the patient were reviewed by me and considered in my medical decision making (see chart for details).  Clinical Course as of Sep 20 1242  Sat Sep 20, 2020  1010 ED EKG [RS]    Clinical Course User Index [RS] Rayola Everhart, Sharlene Dory   MDM Rules/Calculators/A&P                         86 year old patient with history of COPD, chronic GI bleeding, who presents  with 3 days of cough, chest tightness, shortness of breath.  She has been vaccinated against COVID-19.   Differential diagnosis for this patient's shortness of breath include but are not limited to COPD exacerbation, pneumonia, pulmonary edema, pleural effusion, PE anemia, COVID-19 infection.   Patient hypertensive on intake 175/81, tachypneic with 21 breaths/min.  SPO2 97% on room air, patient is afebrile.  Physical exam concerning for rales on the left lung, otherwise reassuring physical exam.  Patient maintained oxygen saturations over 93% on room air throughout my examination.  She is talking in full sentences, without increased work of breathing.  Chest x-ray, EKG, CBC, CMP, respiratory pathogen panel pending.  EKG with Normal sinus rhythm, no STEMI.  Chest x-ray with Moderate left pleural effusion with opacity in the left lung base which may represent atelectasis and/or consolidation.  Emphysema.  Aortic atherosclerosis.  CBC, mild anemia hemoglobin 11; previously 11.5 on 09/18/20. CMP unremarkable.   Respiratory pathogen panel was negative for COVID-19, influenza A/B.  Patient ambulated on pulse ox by nurse; patient maintained oxygen saturations of 93 to 94% on room air while ambulating.  At the time of my reevaluation of the patient she is resting  comfortably in her hospital bed.  Heart rate, oxygen saturation, blood pressure all remained reassuring.  She is no longer tachypneic.  Case discussed with attending physician, Dr. Laverta Baltimore, who also saw the patient.   At this time I do not feel any further work-up is necessary in the emergency department.  Clear etiology for patient's symptoms with possible pneumonia as well as pleural effusion of the left lung.  Will discharge home with antibiotic therapy for CAP, short course of Lasix. Recommend close follow-up with her primary care doctor for reevaluation.  Recommend close follow-up with her GI providers for further monitoring of her  hemoglobin, stable at this time, in context of chronic GI bleed secondary to colonic AVM.  Patient to continue her oral iron supplementation at home.  Glada voiced understanding of her medical evaluation and treatment plan.  Each of her questions were answered to her expressed satisfaction.  Strict return precautions were given.  Patient is stable for discharge.  CHEROKEE BOCCIO was evaluated in Emergency Department on 09/20/2020 for the symptoms described in the history of present illness. She was evaluated in the context of the global COVID-19 pandemic, which necessitated consideration that the patient might be at risk for infection with the SARS-CoV-2 virus that causes COVID-19. Institutional protocols and algorithms that pertain to the evaluation of patients at risk for COVID-19 are in a state of rapid change based on information released by regulatory bodies including the CDC and federal and state organizations. These policies and algorithms were followed during the patient's care in the ED.  Final Clinical Impression(s) / ED Diagnoses Final diagnoses:  Community acquired pneumonia of left lower lobe of lung  Pleural effusion    Rx / DC Orders ED Discharge Orders         Ordered    furosemide (LASIX) 20 MG tablet  Daily        09/20/20 1154    doxycycline (VIBRAMYCIN) 100 MG capsule  2 times daily        09/20/20 [address] Gypsy Balsam, PA-C 09/20/20 1243    [place] Olds, MD 09/21/20 [phone]

## 2020-09-30 DIAGNOSIS — I10 Essential (primary) hypertension: Secondary | ICD-10-CM | POA: Diagnosis not present

## 2020-09-30 DIAGNOSIS — J449 Chronic obstructive pulmonary disease, unspecified: Secondary | ICD-10-CM | POA: Diagnosis not present

## 2020-09-30 DIAGNOSIS — Z681 Body mass index (BMI) 19 or less, adult: Secondary | ICD-10-CM | POA: Diagnosis not present

## 2020-09-30 DIAGNOSIS — J189 Pneumonia, unspecified organism: Secondary | ICD-10-CM | POA: Diagnosis not present

## 2020-10-13 DIAGNOSIS — C44629 Squamous cell carcinoma of skin of left upper limb, including shoulder: Secondary | ICD-10-CM | POA: Diagnosis not present

## 2020-11-03 ENCOUNTER — Other Ambulatory Visit (HOSPITAL_COMMUNITY): Payer: Self-pay | Admitting: Internal Medicine

## 2020-11-03 ENCOUNTER — Other Ambulatory Visit: Payer: Self-pay

## 2020-11-03 ENCOUNTER — Ambulatory Visit (HOSPITAL_COMMUNITY)
Admission: RE | Admit: 2020-11-03 | Discharge: 2020-11-03 | Disposition: A | Discharge status: Home / Self Care | Payer: Medicare Other | Source: Ambulatory Visit | Attending: Internal Medicine | Admitting: Internal Medicine

## 2020-11-03 DIAGNOSIS — J439 Emphysema, unspecified: Secondary | ICD-10-CM | POA: Diagnosis not present

## 2020-11-03 DIAGNOSIS — R053 Chronic cough: Secondary | ICD-10-CM | POA: Insufficient documentation

## 2020-11-03 DIAGNOSIS — R059 Cough, unspecified: Secondary | ICD-10-CM | POA: Diagnosis not present

## 2020-11-03 DIAGNOSIS — J189 Pneumonia, unspecified organism: Secondary | ICD-10-CM | POA: Diagnosis not present

## 2020-11-03 DIAGNOSIS — Z0001 Encounter for general adult medical examination with abnormal findings: Secondary | ICD-10-CM | POA: Diagnosis not present

## 2020-11-03 DIAGNOSIS — J449 Chronic obstructive pulmonary disease, unspecified: Secondary | ICD-10-CM | POA: Diagnosis not present

## 2020-11-03 DIAGNOSIS — J90 Pleural effusion, not elsewhere classified: Secondary | ICD-10-CM | POA: Diagnosis not present

## 2020-11-12 DIAGNOSIS — Z1389 Encounter for screening for other disorder: Secondary | ICD-10-CM | POA: Diagnosis not present

## 2020-11-12 DIAGNOSIS — Z681 Body mass index (BMI) 19 or less, adult: Secondary | ICD-10-CM | POA: Diagnosis not present

## 2020-11-12 DIAGNOSIS — Z0000 Encounter for general adult medical examination without abnormal findings: Secondary | ICD-10-CM | POA: Diagnosis not present

## 2020-11-14 DIAGNOSIS — I10 Essential (primary) hypertension: Secondary | ICD-10-CM | POA: Diagnosis not present

## 2020-11-14 DIAGNOSIS — E7849 Other hyperlipidemia: Secondary | ICD-10-CM | POA: Diagnosis not present

## 2020-11-27 ENCOUNTER — Other Ambulatory Visit: Payer: Self-pay | Admitting: Internal Medicine

## 2020-11-27 ENCOUNTER — Other Ambulatory Visit: Payer: Self-pay

## 2020-11-27 ENCOUNTER — Ambulatory Visit: Payer: Medicare Other | Admitting: Internal Medicine

## 2020-11-27 ENCOUNTER — Encounter: Payer: Self-pay | Admitting: Internal Medicine

## 2020-11-27 ENCOUNTER — Other Ambulatory Visit (HOSPITAL_COMMUNITY): Payer: Self-pay | Admitting: Internal Medicine

## 2020-11-27 DIAGNOSIS — R0989 Other specified symptoms and signs involving the circulatory and respiratory systems: Secondary | ICD-10-CM

## 2020-11-27 DIAGNOSIS — J90 Pleural effusion, not elsewhere classified: Secondary | ICD-10-CM | POA: Insufficient documentation

## 2020-11-27 DIAGNOSIS — F17210 Nicotine dependence, cigarettes, uncomplicated: Secondary | ICD-10-CM | POA: Diagnosis not present

## 2020-11-27 NOTE — Patient Instructions (Addendum)
For cough / congestion >  Mucinex dm 1200 mg every 12 hours as needed   Please remember to go to the lab and x-ray department at [facility]   for your tests - we will call you with the results when they are available.  The key is to stop smoking completely before smoking completely stops you!   I will send my recommendations to Dr Gerarda Fraction

## 2020-11-27 NOTE — Progress Notes (Signed)
Kathleen Pope, female    DOB: September 04, 1933   MRN: [id number]   Brief patient profile:  50  yowf active smoker with "collagenous colitis" per Dr Kathleen Pope remembers one episode of "bad bronchitis"  Around 2012 requiring prolonged abx but then did fine again s flares until November 2021 > ER  09/20/20 with sob with cough x 3 days with  cxr 1. Atelectasis and/or consolidation in the left lung base with moderate left pleural effusion. 2. Emphysema. 3. Aortic atherosclerosis.  rx doxy x 3 rounds / prednisone and still cough with discolored mucus so referred to pulmonary clinic in [place]  11/27/2020 by Dr  Kathleen Pope     History of Present Illness  11/27/2020  Pulmonary/ 1st office eval/ Kathleen Pope / Kathleen Pope Office  Chief Complaint  Patient presents with  . Consult    Patient recently diagnosed with Pneumonia and currently taking antibiotic and has finished steroids. Patient has productive cough with light tan sputum. Worse at night  Dyspnea:  Not limited by breathing from desired activities  / sedentary now now longer walking neighborhood  Cough: tan never bloody worse immediately hs x "always"  Sleep: finally gets to sleep and does fine but flares again in am  SABA use: none   No obvious day to day or daytime variability or assoc   mucus plugs or hemoptysis or cp or chest tightness, subjective wheeze or overt sinus or hb symptoms.     Also denies any obvious fluctuation of symptoms with weather or environmental changes or other aggravating or alleviating factors except as outlined above   No unusual exposure hx or h/o childhood pna/ asthma or knowledge of premature birth.  Current Allergies, Complete Past Medical History, Past Surgical History, Family History, and Social History were reviewed in [employer] record.  ROS  The following are not active complaints unless bolded Hoarseness, sore throat, dysphagia, dental problems, itching, sneezing,  nasal congestion or discharge  of excess mucus or purulent secretions, ear ache,   fever, chills, sweats, unintended wt loss or wt gain, classically pleuritic or exertional cp,  orthopnea pnd or arm/hand swelling  or leg swelling, presyncope, palpitations, abdominal pain, anorexia, nausea, vomiting, diarrhea  or change in bowel habits or change in bladder habits, change in stools or change in urine, dysuria, hematuria,  rash, arthralgias, visual complaints, headache, numbness, weakness or ataxia or problems with walking or coordination,  change in mood or  memory.           Past Medical History:  Diagnosis Date  . Anemia    pt denies, yet takes iron  . Chronic airway obstruction, not elsewhere classified   . COPD (chronic obstructive pulmonary disease) ([place])   . Diverticulosis   . GI hemorrhage from post-treatment cecal ulcer 08/18/2012  . Hemorrhoids   . HTN (hypertension)   . Hypercholesteremia   . Lower GI bleed multiple   AVM's    Outpatient Medications Prior to Visit  Medication Sig Dispense Refill  . acetaminophen (TYLENOL) 500 MG tablet Take 1,000 mg by mouth every 6 (six) hours as needed. Pain    . amLODipine (NORVASC) 5 MG tablet Take 5 mg by mouth daily.    Marland Kitchen aspirin EC 81 MG tablet Take 81 mg by mouth every other day.    . budesonide (ENTOCORT EC) 3 MG 24 hr capsule Take 51m for one month; then 6 mg for one month; then 3 mg for one month; then stop 180 capsule 1  .  calcium carbonate (OS-CAL) 600 MG TABS Take 600 mg by mouth daily.    Marland Kitchen doxycycline (VIBRAMYCIN) 100 MG capsule Take 100 mg by mouth every 12 (twelve) hours.    Marland Kitchen ECHINACEA PO Take 1 capsule by mouth daily.    . ferrous sulfate 325 (65 FE) MG EC tablet Take 325 mg by mouth 2 (two) times daily.     . fish oil-omega-3 fatty acids 1000 MG capsule Take 1 g by mouth daily.    . furosemide (LASIX) 20 MG tablet Take 1 tablet (20 mg total) by mouth daily for 3 days. 3 tablet 0  . methylPREDNISolone (MEDROL DOSEPAK) 4 MG TBPK tablet Take by mouth.    .  Multiple Vitamin (MULTIVITAMIN) tablet Take 1 tablet by mouth daily.    . pravastatin (PRAVACHOL) 10 MG tablet Take 10 mg by mouth daily.     No facility-administered medications prior to visit.     Objective:     BP (!) 160/70 (BP Location: Left Arm, Patient Position: Sitting, Cuff Size: Normal)   Pulse 91   Temp (!) 97 F (36.1 C) (Temporal)   Ht 5' 1"  (1.549 m)   Wt 87 lb 3.2 oz (39.6 kg)   SpO2 98%   BMI 16.48 kg/m   SpO2: 98 %   Thin amb wf who tends to avoiding  answering a single question asked in a straightforward manner, tending to go off on tangents or answer questions with ambiguous medical terms  And likes to focus on what other doctors say than her actual symptoms, which she minimizes then makes comments like "I couldn't possible have fluid on my lungs"   HEENT : pt wearing mask not removed for exam due to covid -19 concerns.    NECK :  without JVD/Nodes/TM/ nl carotid upstrokes bilaterally   LUNGS: no acc muscle use,  Nl contour chest with minimal insp/exp rhonchi and decreased bs with dullness o@ R base  without cough on insp or exp maneuvers   CV:  RRR  no s3   3/6 SEM or increase in P2, and no edema   ABD:  soft and nontender with nl inspiratory excursion in the supine position. No bruits or organomegaly appreciated, bowel sounds nl  MS:  Nl gait/ ext warm without deformities, calf tenderness, cyanosis or clubbing No obvious joint restrictions   SKIN: warm and dry without lesions    NEURO:  alert,  Peculiar affect,  nl sensorium with  no motor or cerebellar deficits apparent.     I personally reviewed images and agree with radiology impression as follows:  CXR:   Pa and lat 11/03/20  1. Interval resolution of left base infiltrate and left pleural effusion. 2. Mild right base infiltrate and right pleural effusion noted on today's exam.    Assessment   Pleural effusion on right New onset in setting of pna with effusion on L 09/20/20 rx  doxy/pred  She has assoc ongoing cough with slt discolored mucus and aortic stenosis by exam so ddx is small parapneumonic effusion but w/u should start with repeat cxr/ bnp/esr and go from there - understand AS w/u in progress and I support that as well.  Pt declined further w/u - wants to speak to Dr Kathleen Pope to see if there is really necessary.   Discussed in detail all the  indications, usual  risks and alternatives  relative to the benefits with patient who does not agree to proceed with w/u as outlined so can f/u  with another pulmonary doctor of Dr Nolon Rod chosing    Cigarette smoker Counseled re importance of smoking cessation but did not meet time criteria for separate billing          Each maintenance medication was reviewed in detail including emphasizing most importantly the difference between maintenance and prns and under what circumstances the prns are to be triggered using an action plan format where appropriate.  Total time for H and P, chart review, counseling,   and generating customized AVS unique to this initial  office visit / same day charting = 45 min          Christinia Gully, MD 11/27/2020

## 2020-11-28 ENCOUNTER — Encounter: Payer: Self-pay | Admitting: Internal Medicine

## 2020-11-28 NOTE — Assessment & Plan Note (Addendum)
New onset in setting of pna with effusion on L 09/20/20 rx doxy/pred  She has assoc ongoing cough with slt discolored mucus and aortic stenosis by exam so ddx is small parapneumonic effusion but w/u should start with repeat cxr/ bnp/esr and go from there - understand AS w/u in progress and I support that as well.  Pt declined further w/u - wants to speak to Dr Gerarda Fraction to see if there is really necessary.   Discussed in detail all the  indications, usual  risks and alternatives  relative to the benefits with patient who does not agree to proceed with w/u as outlined so can f/u with another pulmonary doctor of Dr Nolon Rod chosing

## 2020-11-28 NOTE — Assessment & Plan Note (Signed)
Counseled re importance of smoking cessation but did not meet time criteria for separate billing           Each maintenance medication was reviewed in detail including emphasizing most importantly the difference between maintenance and prns and under what circumstances the prns are to be triggered using an action plan format where appropriate.  Total time for H and P, chart review, counseling,   and generating customized AVS unique to this initial  office visit / same day charting = 45 min

## 2020-12-03 DIAGNOSIS — Z85828 Personal history of other malignant neoplasm of skin: Secondary | ICD-10-CM | POA: Diagnosis not present

## 2020-12-03 DIAGNOSIS — Z08 Encounter for follow-up examination after completed treatment for malignant neoplasm: Secondary | ICD-10-CM | POA: Diagnosis not present

## 2020-12-03 DIAGNOSIS — C44622 Squamous cell carcinoma of skin of right upper limb, including shoulder: Secondary | ICD-10-CM | POA: Diagnosis not present

## 2020-12-04 ENCOUNTER — Other Ambulatory Visit (HOSPITAL_COMMUNITY)
Admission: RE | Admit: 2020-12-04 | Discharge: 2020-12-04 | Disposition: A | Discharge status: Home / Self Care | Payer: Medicare Other | Source: Ambulatory Visit | Attending: Internal Medicine | Admitting: Internal Medicine

## 2020-12-04 ENCOUNTER — Other Ambulatory Visit: Payer: Self-pay

## 2020-12-04 ENCOUNTER — Ambulatory Visit (HOSPITAL_COMMUNITY)
Admission: RE | Admit: 2020-12-04 | Discharge: 2020-12-04 | Disposition: A | Discharge status: Home / Self Care | Payer: Medicare Other | Source: Ambulatory Visit | Attending: Internal Medicine | Admitting: Internal Medicine

## 2020-12-04 DIAGNOSIS — J90 Pleural effusion, not elsewhere classified: Secondary | ICD-10-CM

## 2020-12-04 DIAGNOSIS — R0609 Other forms of dyspnea: Secondary | ICD-10-CM | POA: Insufficient documentation

## 2020-12-04 DIAGNOSIS — R0989 Other specified symptoms and signs involving the circulatory and respiratory systems: Secondary | ICD-10-CM | POA: Insufficient documentation

## 2020-12-04 DIAGNOSIS — I6523 Occlusion and stenosis of bilateral carotid arteries: Secondary | ICD-10-CM | POA: Insufficient documentation

## 2020-12-04 LAB — BRAIN NATRIURETIC PEPTIDE: B Natriuretic Peptide: 178 pg/mL — ABNORMAL HIGH (ref 0.0–100.0)

## 2020-12-04 LAB — BASIC METABOLIC PANEL
Anion gap: 7 (ref 5–15)
BUN: 11 mg/dL (ref 8–23)
CO2: 28 mmol/L (ref 22–32)
Calcium: 9.7 mg/dL (ref 8.9–10.3)
Chloride: 104 mmol/L (ref 98–111)
Creatinine, Ser: 0.75 mg/dL (ref 0.44–1.00)
GFR, Estimated: >60 mL/min (ref >60)
Glucose, Bld: 134 mg/dL — ABNORMAL HIGH (ref 70–99)
Potassium: 3.3 mmol/L — ABNORMAL LOW (ref 3.5–5.1)
Sodium: 139 mmol/L (ref 135–145)

## 2020-12-04 LAB — CBC WITH DIFFERENTIAL/PLATELET
Abs Immature Granulocytes: 0.02 10*3/uL (ref 0.00–0.07)
Basophils Absolute: 0 10*3/uL (ref 0.0–0.1)
Basophils Relative: 0 %
Eosinophils Absolute: 0.2 10*3/uL (ref 0.0–0.5)
Eosinophils Relative: 2 %
HCT: 41.5 % (ref 36.0–46.0)
Hemoglobin: 13.1 g/dL (ref 12.0–15.0)
Immature Granulocytes: 0 %
Lymphocytes Relative: 24 %
Lymphs Abs: 1.5 10*3/uL (ref 0.7–4.0)
MCH: 31.6 pg (ref 26.0–34.0)
MCHC: 31.6 g/dL (ref 30.0–36.0)
MCV: 100.2 fL — ABNORMAL HIGH (ref 80.0–100.0)
Monocytes Absolute: 0.6 10*3/uL (ref 0.1–1.0)
Monocytes Relative: 9 %
Neutro Abs: 4.1 10*3/uL (ref 1.7–7.7)
Neutrophils Relative %: 65 %
Platelets: 281 10*3/uL (ref 150–400)
RBC: 4.14 MIL/uL (ref 3.87–5.11)
RDW: 13.5 % (ref 11.5–15.5)
WBC: 6.4 10*3/uL (ref 4.0–10.5)
nRBC: 0 % (ref 0.0–0.2)

## 2020-12-04 LAB — SEDIMENTATION RATE: Sed Rate: 30 mm/hr — ABNORMAL HIGH (ref 0–22)

## 2020-12-05 ENCOUNTER — Ambulatory Visit: Payer: Medicare Other | Admitting: Cardiology

## 2020-12-05 ENCOUNTER — Other Ambulatory Visit: Payer: Self-pay

## 2020-12-05 DIAGNOSIS — J90 Pleural effusion, not elsewhere classified: Secondary | ICD-10-CM

## 2020-12-05 NOTE — Progress Notes (Signed)
Called patient and let her know Dr Gustavus Bryant response that all signs of pneumonia are gone. All questions answered and patient expressed full understanding. Nothing further needed at this time.

## 2020-12-05 NOTE — Progress Notes (Signed)
Called and went over xray result with patient. Patient would like to know if there is still any pneumonia in either lung? Dr Melvyn Novas please advise.   Patient ok with repeat chest xray with nipple markers and states she will go to [facility] Monday to complete. Order placed per Dr Melvyn Novas.

## 2020-12-05 NOTE — Progress Notes (Signed)
Called and went over lab result per Dr Melvyn Novas with patient. All questions answered and patient expressed full understanding of result and Dr Gustavus Bryant recommendation to eat more fruits and vegetables. Nothing further needed at this time.

## 2020-12-08 ENCOUNTER — Other Ambulatory Visit: Payer: Self-pay

## 2020-12-08 ENCOUNTER — Ambulatory Visit (HOSPITAL_COMMUNITY)
Admission: RE | Admit: 2020-12-08 | Discharge: 2020-12-08 | Disposition: A | Discharge status: Home / Self Care | Payer: Medicare Other | Source: Ambulatory Visit | Attending: Internal Medicine | Admitting: Internal Medicine

## 2020-12-08 DIAGNOSIS — I700 Atherosclerosis of aorta: Secondary | ICD-10-CM | POA: Diagnosis not present

## 2020-12-08 DIAGNOSIS — J449 Chronic obstructive pulmonary disease, unspecified: Secondary | ICD-10-CM | POA: Diagnosis not present

## 2020-12-08 DIAGNOSIS — J90 Pleural effusion, not elsewhere classified: Secondary | ICD-10-CM | POA: Insufficient documentation

## 2020-12-08 DIAGNOSIS — J8410 Pulmonary fibrosis, unspecified: Secondary | ICD-10-CM | POA: Diagnosis not present

## 2020-12-08 DIAGNOSIS — J984 Other disorders of lung: Secondary | ICD-10-CM | POA: Diagnosis not present

## 2020-12-09 NOTE — Progress Notes (Signed)
Spoke with pt and notified of results per Dr. Wert. Pt verbalized understanding and denied any questions. 

## 2020-12-11 DIAGNOSIS — C44622 Squamous cell carcinoma of skin of right upper limb, including shoulder: Secondary | ICD-10-CM | POA: Diagnosis not present

## 2020-12-14 NOTE — Progress Notes (Signed)
Cardiology Office Note:    Date:  12/17/2020   ID:  Kathleen Pope, DOB 08-09-1933, MRN [id number]  PCP:  Redmond School, MD  [place] Cardiologist:  No primary care provider on file.  [facility] Electrophysiologist:  None   Referring MD: Redmond School, MD     History of Present Illness:    Kathleen Pope is a 87 y.o. female with a hx of active tobacco use, HTN, COPD, HLD, history of GIB from AVM who was referred by Dr. Gerarda Fraction for further management of aortic stenosis.  Patient states that she feels overall well. No chest pain on exertion, SOB, lightheadedness or dizziness. Feels less steady on her feet and does not walk as regularly as she used to but she is overall active and runs errands and cleaning the house. No symptoms with exertion. No palpitations, dizziness, syncope, LE edema, orthopnea. No bleeding issues. Current smoker and smokes 1ppd since the age of 69. Not interested in quitting.  Blood pressure is being monitored by PCP with plans to increase amlodipine. She also was noted to have possible significant plaque in bilateral carotids. Planned for vascular evaluation.  Last TTE 2013 with mild AS with AVA 1.43, mean gradient 64mHg  Past Medical History:  Diagnosis Date  . Anemia    pt denies, yet takes iron  . Chronic airway obstruction, not elsewhere classified   . COPD (chronic obstructive pulmonary disease) ([place]   . Diverticulosis   . GI hemorrhage from post-treatment cecal ulcer 08/18/2012  . Hemorrhoids   . HTN (hypertension)   . Hypercholesteremia   . Lower GI bleed multiple   AVM's    Past Surgical History:  Procedure Laterality Date  . BUNIONECTOMY     left  . COLECTOMY    . COLONOSCOPY  08/10/2012   Procedure: COLONOSCOPY;  Surgeon: MLadene Artist MD,FACG;  Location: [facility];  Service: Endoscopy;  Laterality: N/A;  . COLONOSCOPY  08/19/2012   Procedure: COLONOSCOPY;  Surgeon: CGatha Mayer MD;  Location: [facility];  Service:  Endoscopy;  Laterality: N/A;  . COLONOSCOPY N/A 03/11/2013   Procedure: COLONOSCOPY;  Surgeon: JIrene Shipper MD;  Location: [facility];  Service: Endoscopy;  Laterality: N/A;  . TONSILLECTOMY      Current Medications: Current Meds  Medication Sig  . acetaminophen (TYLENOL) 500 MG tablet Take 1,000 mg by mouth every 6 (six) hours as needed. Pain  . amLODipine (NORVASC) 5 MG tablet Take 5 mg by mouth daily.  .Marland Kitchenaspirin EC 81 MG tablet Take 81 mg by mouth every other day.  . budesonide (ENTOCORT EC) 3 MG 24 hr capsule Take 977mfor one month; then 6 mg for one month; then 3 mg for one month; then stop  . calcium carbonate (OS-CAL) 600 MG TABS Take 600 mg by mouth daily.  . Marland Kitchenoxycycline (VIBRAMYCIN) 100 MG capsule Take 100 mg by mouth every 12 (twelve) hours.  . Marland KitchenCHINACEA PO Take 1 capsule by mouth daily.  . ferrous sulfate 325 (65 FE) MG EC tablet Take 325 mg by mouth 2 (two) times daily.   . fish oil-omega-3 fatty acids 1000 MG capsule Take 1 g by mouth daily.  . methylPREDNISolone (MEDROL DOSEPAK) 4 MG TBPK tablet Take by mouth.  . Multiple Vitamin (MULTIVITAMIN) tablet Take 1 tablet by mouth daily.  . pravastatin (PRAVACHOL) 10 MG tablet Take 10 mg by mouth daily.     Allergies:   Patient has no known allergies.   Social History  Socioeconomic History  . Marital status: Widowed    Spouse name: Not on file  . Number of children: 1  . Years of education: Not on file  . Highest education level: Not on file  Occupational History  . Occupation: retired    Fish farm manager: RETIRED  Tobacco Use  . Smoking status: Current Every Day Smoker    Packs/day: 1.00    Years: 59.00    Pack years: 59.00    Types: Cigarettes  . Smokeless tobacco: Never Used  . Tobacco comment: tobacco info given 06/09/18  Vaping Use  . Vaping Use: Never used  Substance and Sexual Activity  . Alcohol use: No  . Drug use: No  . Sexual activity: Never  Other Topics Concern  . Not on file  Social History  Narrative  . Not on file   Social Determinants of Health   Financial Resource Strain: Not on file  Food Insecurity: Not on file  Transportation Needs: Not on file  Physical Activity: Not on file  Stress: Not on file  Social Connections: Not on file     Family History: The patient's family history includes Anuerysm in her brother; Breast cancer in her mother and sister; Lung cancer in her father. There is no history of Colon cancer, Esophageal cancer, or Rectal cancer.  ROS:   Please see the history of present illness.    Review of Systems  Constitutional: Negative for chills, fever and weight loss.  HENT: Negative for nosebleeds.   Eyes: Negative for blurred vision.  Respiratory: Negative for shortness of breath.   Cardiovascular: Negative for chest pain, palpitations, orthopnea, claudication, leg swelling and PND.  Gastrointestinal: Negative for nausea and vomiting.  Genitourinary: Negative for hematuria.  Musculoskeletal: Negative for falls.  Neurological: Negative for dizziness and loss of consciousness.  Endo/Heme/Allergies: Negative for polydipsia.  Psychiatric/Behavioral: Negative for substance abuse.    EKGs/Labs/Other Studies Reviewed:    The following studies were reviewed today: Carotid ultrasound 12/04/20:  RIGHT CAROTID ARTERY: No significant calcified disease of the right common carotid artery. Intermediate waveform maintained. Heterogeneous plaque without significant calcifications at the right carotid bifurcation. Low resistance waveform of the right ICA. No significant tortuosity.  RIGHT VERTEBRAL ARTERY: Antegrade flow with low resistance waveform.  LEFT CAROTID ARTERY: No significant calcified disease of the left common carotid artery. Intermediate waveform maintained. Heterogeneous plaque at the left carotid bifurcation without significant calcifications. Low resistance waveform of the left ICA.  LEFT VERTEBRAL ARTERY:  Antegrade flow with low  resistance waveform.  IMPRESSION: Heterogeneous plaque at the bilateral carotid bifurcation, with discordant results regarding degree of stenosis by established duplex criteria. Peak velocity suggests no significant stenosis, with the ICA/ CCA ratio suggesting a greater degree of stenosis. If establishing a more accurate degree of stenosis is required, cerebral angiogram should be considered, or as a second best test, CTA.  TTE 2013: Study Conclusions   - Left ventricle: Systolic function was vigorous. The  estimated ejection fraction was in the range of 70% to  75%. Wall motion was normal; there were no regional wall  motion abnormalities. Doppler parameters are consistent  with abnormal left ventricular relaxation (grade 1  diastolic dysfunction). Doppler parameters are consistent  with elevated mean left atrial filling pressure.  - Aortic valve: Moderately calcified leaflets. Valve  structure poorly visualized, asymmetric, cannot exclude  bicuspid morphology. There was mild stenosis. Trivial  regurgitation. Mean gradient: 54m Hg (S). Valve area:  1.43cm^2(VTI). Valve area: 1.47cm^2 (Vmax).  - Mitral valve:  Calcified annulus. Mildly thickened leaflets  . Mild regurgitation directed posteriorly.  - Tricuspid valve: Mild regurgitation.  - Pericardium, extracardiac: A prominent pericardial fat pad  was present.    EKG:  EKG 09/22/20: NSR with LVH and repol  Recent Labs: 09/20/2020: ALT 14 12/04/2020: B Natriuretic Peptide 178.0; BUN 11; Creatinine, Ser 0.75; Hemoglobin 13.1; Platelets 281; Potassium 3.3; Sodium 139  Recent Lipid Panel No results found for: CHOL, TRIG, HDL, CHOLHDL, VLDL, LDLCALC, LDLDIRECT    Physical Exam:    VS:  BP (!) 134/92   Pulse 96   Ht 5' 1"  (1.549 m)   Wt 88 lb (39.9 kg)   SpO2 97%   BMI 16.63 kg/m     Wt Readings from Last 3 Encounters:  12/17/20 88 lb (39.9 kg)  11/27/20 87 lb 3.2 oz (39.6 kg)  09/20/20 90 lb  (40.8 kg)     GEN:  Thin, elderly female, NAD HEENT: Normal NECK: No JVD; prominent AS murmur heard in carotids CARDIAC: RRR, 2/6 harsh, early peaking systolic murmur radiating to the carotids RESPIRATORY:  Clear to auscultation without rales, wheezing or rhonchi  ABDOMEN: Soft, non-tender, non-distended MUSCULOSKELETAL:  No edema; No deformity  SKIN: Warm and dry NEUROLOGIC:  Alert and oriented x 3 PSYCHIATRIC:  Normal affect   ASSESSMENT:    1. Aortic valve stenosis, etiology of cardiac valve disease unspecified   2. Primary hypertension   3. Pure hypercholesterolemia   4. Chronic obstructive pulmonary disease, unspecified COPD type ([place])   5. Bilateral carotid artery stenosis   6. Tobacco abuse    PLAN:    In order of problems listed above:  #Mild Aortic Stenosis: Last TTE with  mild AS with AVA 1.43, mean gradient 36mHg. Patient is reassuringly asymptomatic with no chest pain, exertional symptoms, LE edema, orthopnea, dizziness/lightheadedness or SOB.  -Check TTE to monitor for progression  #New onset parapneumonic effusion: Seen by pulmonary and recommended CXR, BNP and ESR however patient declined at that time. No SOB today and received a short course of lasix with improved symptoms -Follow-up with pulm as scheduled  #Suspected Carotid Artery Stenosis: U/s suggestive of carotid stenosis but velocities discordant. Asymptomatic with no stroke like symptoms. Plan for vascular follow-up. -Follow-up with vascular as scheduled -Continue ASA and pravastatin  #COPD: #Active Tobacco use: Patient is not interested in quitting at this time. -Follow-up with pulm as scheduled -Continue to try to encourage smoking cessation  #HTN: Managed by PCP. Has been mildly elevated at home with plans to up-titrate per PCP. -PCP plans to up-titrate amlodipine  #HLD: -Continue prava 122mdaily  Medication Adjustments/Labs and Tests Ordered: Current medicines are reviewed at length  with the patient today.  Concerns regarding medicines are outlined above.  Orders Placed This Encounter  Procedures  . ECHOCARDIOGRAM COMPLETE   No orders of the defined types were placed in this encounter.   Patient Instructions  Medication Instructions:  Your physician recommends that you continue on your current medications as directed. Please refer to the Current Medication list given to you today.  *If you need a refill on your cardiac medications before your next appointment, please call your pharmacy*   Lab Work: NONE If you have labs (blood work) drawn today and your tests are completely normal, you will receive your results only by: . Marland KitchenyChart Message (if you have MyChart) OR . A paper copy in the mail If you have any lab test that is abnormal or we need to change your treatment,  we will call you to review the results.   Testing/Procedures: Your physician has requested that you have an echocardiogram. Echocardiography is a painless test that uses sound waves to create images of your heart. It provides your doctor with information about the size and shape of your heart and how well your heart's chambers and valves are working. This procedure takes approximately one hour. There are no restrictions for this procedure.  Follow-Up: At [facility], you and your health needs are our priority.  As part of our continuing mission to provide you with exceptional heart care, we have created designated Provider Care Teams.  These Care Teams include your primary Cardiologist (physician) and Advanced Practice Providers (APPs -  Physician Assistants and Nurse Practitioners) who all work together to provide you with the care you need, when you need it.  We recommend signing up for the patient portal called "MyChart".  Sign up information is provided on this After Visit Summary.  MyChart is used to connect with patients for Virtual Visits (Telemedicine).  Patients are able to view lab/test  results, encounter notes, upcoming appointments, etc.  Non-urgent messages can be sent to your provider as well.   To learn more about what you can do with MyChart, go to [email or web address].    Your next appointment:   6 month(s)  The format for your next appointment:   In Person  Provider:   You may see DR. Khushi Zupko or one of the following Advanced Practice Providers on your designated Care Team:    Richardson Dopp, PA-C  Robbie Lis, [place]         Signed, Freada Bergeron, MD  12/17/2020 10:27 AM    [place]

## 2020-12-16 DIAGNOSIS — C4490 Unspecified malignant neoplasm of skin, unspecified: Secondary | ICD-10-CM | POA: Diagnosis not present

## 2020-12-16 DIAGNOSIS — I10 Essential (primary) hypertension: Secondary | ICD-10-CM | POA: Diagnosis not present

## 2020-12-16 DIAGNOSIS — Z681 Body mass index (BMI) 19 or less, adult: Secondary | ICD-10-CM | POA: Diagnosis not present

## 2020-12-16 DIAGNOSIS — I6523 Occlusion and stenosis of bilateral carotid arteries: Secondary | ICD-10-CM | POA: Diagnosis not present

## 2020-12-16 DIAGNOSIS — J449 Chronic obstructive pulmonary disease, unspecified: Secondary | ICD-10-CM | POA: Diagnosis not present

## 2020-12-17 ENCOUNTER — Ambulatory Visit: Payer: Medicare Other | Admitting: Cardiology

## 2020-12-17 ENCOUNTER — Encounter: Payer: Self-pay | Admitting: Cardiology

## 2020-12-17 ENCOUNTER — Other Ambulatory Visit: Payer: Self-pay

## 2020-12-17 VITALS — BP 134/92 | HR 96 | Ht 61.0 in | Wt 88.0 lb

## 2020-12-17 DIAGNOSIS — J449 Chronic obstructive pulmonary disease, unspecified: Secondary | ICD-10-CM

## 2020-12-17 DIAGNOSIS — I10 Essential (primary) hypertension: Secondary | ICD-10-CM

## 2020-12-17 DIAGNOSIS — I6523 Occlusion and stenosis of bilateral carotid arteries: Secondary | ICD-10-CM

## 2020-12-17 DIAGNOSIS — I350 Nonrheumatic aortic (valve) stenosis: Secondary | ICD-10-CM | POA: Diagnosis not present

## 2020-12-17 DIAGNOSIS — E7800 Pure hypercholesterolemia, unspecified: Secondary | ICD-10-CM

## 2020-12-17 DIAGNOSIS — Z720 Tobacco use: Secondary | ICD-10-CM

## 2020-12-17 NOTE — Patient Instructions (Signed)
Medication Instructions:  Your physician recommends that you continue on your current medications as directed. Please refer to the Current Medication list given to you today.  *If you need a refill on your cardiac medications before your next appointment, please call your pharmacy*   Lab Work: NONE If you have labs (blood work) drawn today and your tests are completely normal, you will receive your results only by: Marland Kitchen MyChart Message (if you have MyChart) OR . A paper copy in the mail If you have any lab test that is abnormal or we need to change your treatment, we will call you to review the results.   Testing/Procedures: Your physician has requested that you have an echocardiogram. Echocardiography is a painless test that uses sound waves to create images of your heart. It provides your doctor with information about the size and shape of your heart and how well your heart's chambers and valves are working. This procedure takes approximately one hour. There are no restrictions for this procedure.  Follow-Up: At [facility], you and your health needs are our priority.  As part of our continuing mission to provide you with exceptional heart care, we have created designated Provider Care Teams.  These Care Teams include your primary Cardiologist (physician) and Advanced Practice Providers (APPs -  Physician Assistants and Nurse Practitioners) who all work together to provide you with the care you need, when you need it.  We recommend signing up for the patient portal called "MyChart".  Sign up information is provided on this After Visit Summary.  MyChart is used to connect with patients for Virtual Visits (Telemedicine).  Patients are able to view lab/test results, encounter notes, upcoming appointments, etc.  Non-urgent messages can be sent to your provider as well.   To learn more about what you can do with MyChart, go to [email or web address].    Your next appointment:   6  month(s)  The format for your next appointment:   In Person  Provider:   You may see DR. PEMBERTON or one of the following Advanced Practice Providers on your designated Care Team:    Richardson Dopp, PA-C  [place], [place]

## 2020-12-19 DIAGNOSIS — C44622 Squamous cell carcinoma of skin of right upper limb, including shoulder: Secondary | ICD-10-CM | POA: Diagnosis not present

## 2020-12-29 ENCOUNTER — Ambulatory Visit: Payer: Medicare Other | Admitting: Vascular Surgery

## 2020-12-29 ENCOUNTER — Encounter: Payer: Self-pay | Admitting: Vascular Surgery

## 2020-12-29 ENCOUNTER — Other Ambulatory Visit: Payer: Self-pay

## 2020-12-29 VITALS — BP 210/91 | HR 80 | Temp 98.4°F | Resp 16 | Ht 61.5 in | Wt 89.0 lb

## 2020-12-29 DIAGNOSIS — I6523 Occlusion and stenosis of bilateral carotid arteries: Secondary | ICD-10-CM | POA: Diagnosis not present

## 2020-12-29 NOTE — Progress Notes (Signed)
Vascular and Vein Specialist of [place]  Patient name: Kathleen Pope MRN: [id number] DOB: 1933-08-15 Sex: female  REASON FOR CONSULT: Evaluation bilateral carotid disease  HPI: Kathleen Pope is a 87 y.o. female, who is here today for evaluation of bilateral carotid disease.  She denies any prior neurologic deficits.  Specifically no history of amaurosis fugax, aphasia, TIA or stroke.  She is right-handed.  She was found to have bilateral carotid bruits and underwent further carotid duplex.  Duplex was at [facility] on 12/04/2020.  She is here today for discussion of this.  She is scheduled to have a echocardiogram in the next several weeks.  In reviewing echocardiogram from 2013 she did have aortic valvular calcification.  Past Medical History:  Diagnosis Date  . Anemia    pt denies, yet takes iron  . Chronic airway obstruction, not elsewhere classified   . COPD (chronic obstructive pulmonary disease) ([place])   . Diverticulosis   . GI hemorrhage from post-treatment cecal ulcer 08/18/2012  . Hemorrhoids   . HTN (hypertension)   . Hypercholesteremia   . Lower GI bleed multiple   AVM's    Family History  Problem Relation Age of Onset  . Breast cancer Mother   . Lung cancer Father   . Breast cancer Sister   . Anuerysm Brother        in stomach  . Colon cancer Neg Hx   . Esophageal cancer Neg Hx   . Rectal cancer Neg Hx     SOCIAL HISTORY: Social History   Socioeconomic History  . Marital status: Widowed    Spouse name: Not on file  . Number of children: 1  . Years of education: Not on file  . Highest education level: Not on file  Occupational History  . Occupation: retired    Fish farm manager: RETIRED  Tobacco Use  . Smoking status: Current Every Day Smoker    Packs/day: 1.00    Years: 59.00    Pack years: 59.00    Types: Cigarettes  . Smokeless tobacco: Never Used  . Tobacco comment: tobacco info given 06/09/18  Vaping Use  .  Vaping Use: Never used  Substance and Sexual Activity  . Alcohol use: No  . Drug use: No  . Sexual activity: Never  Other Topics Concern  . Not on file  Social History Narrative  . Not on file   Social Determinants of Health   Financial Resource Strain: Not on file  Food Insecurity: Not on file  Transportation Needs: Not on file  Physical Activity: Not on file  Stress: Not on file  Social Connections: Not on file  Intimate Partner Violence: Not on file    No Known Allergies  Current Outpatient Medications  Medication Sig Dispense Refill  . acetaminophen (TYLENOL) 500 MG tablet Take 1,000 mg by mouth every 6 (six) hours as needed. Pain    . amLODipine (NORVASC) 5 MG tablet Take 5 mg by mouth daily.    Marland Kitchen aspirin EC 81 MG tablet Take 81 mg by mouth every other day.    . budesonide (ENTOCORT EC) 3 MG 24 hr capsule Take 9mg  for one month; then 6 mg for one month; then 3 mg for one month; then stop 180 capsule 1  . calcium carbonate (OS-CAL) 600 MG TABS Take 600 mg by mouth daily.    Marland Kitchen ECHINACEA PO Take 1 capsule by mouth daily.    . ferrous sulfate 325 (65 FE) MG EC  tablet Take 325 mg by mouth 2 (two) times daily.     . fish oil-omega-3 fatty acids 1000 MG capsule Take 1 g by mouth daily.    . Multiple Vitamin (MULTIVITAMIN) tablet Take 1 tablet by mouth daily.    . pravastatin (PRAVACHOL) 10 MG tablet Take 10 mg by mouth daily.    . furosemide (LASIX) 20 MG tablet Take 1 tablet (20 mg total) by mouth daily for 3 days. 3 tablet 0   No current facility-administered medications for this visit.    REVIEW OF SYSTEMS:  [X]  denotes positive finding, [ ]  denotes negative finding Cardiac  Comments:  Chest pain or chest pressure:    Shortness of breath upon exertion:    Short of breath when lying flat:    Irregular heart rhythm:        Vascular    Pain in calf, thigh, or hip brought on by ambulation:    Pain in feet at night that wakes you up from your sleep:     Blood clot in  your veins:    Leg swelling:         Pulmonary    Oxygen at home:    Productive cough:     Wheezing:         Neurologic    Sudden weakness in arms or legs:     Sudden numbness in arms or legs:     Sudden onset of difficulty speaking or slurred speech:    Temporary loss of vision in one eye:     Problems with dizziness:         Gastrointestinal    Blood in stool:     Vomited blood:         Genitourinary    Burning when urinating:     Blood in urine:        Psychiatric    Major depression:         Hematologic    Bleeding problems:    Problems with blood clotting too easily:        Skin    Rashes or ulcers:        Constitutional    Fever or chills:      PHYSICAL EXAM: Vitals:   12/29/20 1117  BP: (!) 210/91  Pulse: 80  Resp: 16  Temp: 98.4 F (36.9 C)  TempSrc: Other (Comment)  SpO2: 94%  Weight: 89 lb (40.4 kg)  Height: 5' 1.5" (1.562 m)    GENERAL: The patient is a well-nourished female, in no acute distress. The vital signs are documented above. CARDIOVASCULAR: Harsh bilateral carotid bruits.  Loud systolic murmur.  2-3+ radial pulses bilaterally PULMONARY: There is good air exchange  MUSCULOSKELETAL: There are no major deformities or cyanosis. NEUROLOGIC: No focal weakness or paresthesias are detected. SKIN: There are no ulcers or rashes noted. PSYCHIATRIC: The patient has a normal affect.  DATA:  Carotid duplex was reviewed.  This showed no significant increases in her systolic or end-diastolic flow in her internal carotid artery.  There was some concern regarding the interpreting radiologist regarding discordant readings comparing the ICA/CCA ratio.  MEDICAL ISSUES: Asymptomatic carotid disease.  Difficult to determine if this is harsh bruits related to carotid disease or transmitted from severe heart murmur.  She does have a 2D echocardiogram pending for further evaluation.  I would not recommend any further evaluation currently have her carotids  since she is asymptomatic.  She certainly does not have significant disease based on  her duplex criteria.  I would recommend a repeat duplex in 6 months and we will see her in our office for follow-up at that time.  She knows to present immediately to the emergency room should she develop any neurologic deficits   Rosetta Posner, MD [facility] and Ve[facility] Specialists [facility] [phone] Pager [phone]  Note: Portions of this report may have been transcribed using voice recognition software.  Every effort has been made to ensure accuracy; however, inadvertent computerized transcription errors may still be present.

## 2020-12-31 DIAGNOSIS — Z08 Encounter for follow-up examination after completed treatment for malignant neoplasm: Secondary | ICD-10-CM | POA: Diagnosis not present

## 2020-12-31 DIAGNOSIS — Z85828 Personal history of other malignant neoplasm of skin: Secondary | ICD-10-CM | POA: Diagnosis not present

## 2020-12-31 DIAGNOSIS — L570 Actinic keratosis: Secondary | ICD-10-CM | POA: Diagnosis not present

## 2020-12-31 DIAGNOSIS — L821 Other seborrheic keratosis: Secondary | ICD-10-CM | POA: Diagnosis not present

## 2021-01-02 ENCOUNTER — Other Ambulatory Visit: Payer: Self-pay

## 2021-01-02 DIAGNOSIS — I6523 Occlusion and stenosis of bilateral carotid arteries: Secondary | ICD-10-CM

## 2021-01-08 ENCOUNTER — Observation Stay (HOSPITAL_COMMUNITY): Payer: Medicare Other

## 2021-01-08 ENCOUNTER — Encounter (HOSPITAL_COMMUNITY): Payer: Self-pay

## 2021-01-08 ENCOUNTER — Emergency Department (HOSPITAL_COMMUNITY): Payer: Medicare Other

## 2021-01-08 ENCOUNTER — Other Ambulatory Visit: Payer: Self-pay

## 2021-01-08 ENCOUNTER — Inpatient Hospital Stay (HOSPITAL_COMMUNITY)
Admission: EM | Admit: 2021-01-08 | Discharge: 2021-01-10 | DRG: 377 | Disposition: A | Discharge status: Home / Self Care | Payer: Medicare Other | Attending: Family Medicine | Admitting: Family Medicine

## 2021-01-08 DIAGNOSIS — I10 Essential (primary) hypertension: Secondary | ICD-10-CM | POA: Diagnosis not present

## 2021-01-08 DIAGNOSIS — Z79899 Other long term (current) drug therapy: Secondary | ICD-10-CM | POA: Diagnosis not present

## 2021-01-08 DIAGNOSIS — K52831 Collagenous colitis: Secondary | ICD-10-CM | POA: Diagnosis present

## 2021-01-08 DIAGNOSIS — Z7951 Long term (current) use of inhaled steroids: Secondary | ICD-10-CM | POA: Diagnosis not present

## 2021-01-08 DIAGNOSIS — E43 Unspecified severe protein-calorie malnutrition: Secondary | ICD-10-CM | POA: Diagnosis present

## 2021-01-08 DIAGNOSIS — I700 Atherosclerosis of aorta: Secondary | ICD-10-CM | POA: Diagnosis not present

## 2021-01-08 DIAGNOSIS — D122 Benign neoplasm of ascending colon: Secondary | ICD-10-CM | POA: Diagnosis not present

## 2021-01-08 DIAGNOSIS — Z20822 Contact with and (suspected) exposure to covid-19: Secondary | ICD-10-CM | POA: Diagnosis present

## 2021-01-08 DIAGNOSIS — F17210 Nicotine dependence, cigarettes, uncomplicated: Secondary | ICD-10-CM | POA: Diagnosis not present

## 2021-01-08 DIAGNOSIS — R011 Cardiac murmur, unspecified: Secondary | ICD-10-CM

## 2021-01-08 DIAGNOSIS — K5521 Angiodysplasia of colon with hemorrhage: Principal | ICD-10-CM | POA: Diagnosis present

## 2021-01-08 DIAGNOSIS — Z681 Body mass index (BMI) 19 or less, adult: Secondary | ICD-10-CM | POA: Diagnosis not present

## 2021-01-08 DIAGNOSIS — Z9049 Acquired absence of other specified parts of digestive tract: Secondary | ICD-10-CM

## 2021-01-08 DIAGNOSIS — K5520 Angiodysplasia of colon without hemorrhage: Secondary | ICD-10-CM | POA: Diagnosis not present

## 2021-01-08 DIAGNOSIS — J449 Chronic obstructive pulmonary disease, unspecified: Secondary | ICD-10-CM | POA: Diagnosis present

## 2021-01-08 DIAGNOSIS — K625 Hemorrhage of anus and rectum: Secondary | ICD-10-CM | POA: Diagnosis not present

## 2021-01-08 DIAGNOSIS — Z716 Tobacco abuse counseling: Secondary | ICD-10-CM | POA: Diagnosis not present

## 2021-01-08 DIAGNOSIS — K921 Melena: Secondary | ICD-10-CM

## 2021-01-08 DIAGNOSIS — K641 Second degree hemorrhoids: Secondary | ICD-10-CM | POA: Diagnosis present

## 2021-01-08 DIAGNOSIS — D120 Benign neoplasm of cecum: Secondary | ICD-10-CM

## 2021-01-08 DIAGNOSIS — I959 Hypotension, unspecified: Secondary | ICD-10-CM | POA: Diagnosis not present

## 2021-01-08 DIAGNOSIS — K922 Gastrointestinal hemorrhage, unspecified: Secondary | ICD-10-CM

## 2021-01-08 DIAGNOSIS — E7800 Pure hypercholesterolemia, unspecified: Secondary | ICD-10-CM | POA: Diagnosis not present

## 2021-01-08 DIAGNOSIS — Z7982 Long term (current) use of aspirin: Secondary | ICD-10-CM

## 2021-01-08 DIAGNOSIS — Z66 Do not resuscitate: Secondary | ICD-10-CM | POA: Diagnosis present

## 2021-01-08 DIAGNOSIS — K644 Residual hemorrhoidal skin tags: Secondary | ICD-10-CM | POA: Diagnosis present

## 2021-01-08 DIAGNOSIS — J9811 Atelectasis: Secondary | ICD-10-CM | POA: Diagnosis present

## 2021-01-08 DIAGNOSIS — R000 Tachycardia, unspecified: Secondary | ICD-10-CM | POA: Diagnosis not present

## 2021-01-08 DIAGNOSIS — D62 Acute posthemorrhagic anemia: Secondary | ICD-10-CM

## 2021-01-08 DIAGNOSIS — K5730 Diverticulosis of large intestine without perforation or abscess without bleeding: Secondary | ICD-10-CM | POA: Diagnosis not present

## 2021-01-08 DIAGNOSIS — I350 Nonrheumatic aortic (valve) stenosis: Secondary | ICD-10-CM | POA: Diagnosis present

## 2021-01-08 DIAGNOSIS — E782 Mixed hyperlipidemia: Secondary | ICD-10-CM | POA: Diagnosis present

## 2021-01-08 DIAGNOSIS — R935 Abnormal findings on diagnostic imaging of other abdominal regions, including retroperitoneum: Secondary | ICD-10-CM

## 2021-01-08 DIAGNOSIS — K635 Polyp of colon: Secondary | ICD-10-CM | POA: Diagnosis present

## 2021-01-08 LAB — COMPREHENSIVE METABOLIC PANEL
ALT: 17 U/L (ref 0–44)
AST: 23 U/L (ref 15–41)
Albumin: 4 g/dL (ref 3.5–5.0)
Alkaline Phosphatase: 57 U/L (ref 38–126)
Anion gap: 11 (ref 5–15)
BUN: 14 mg/dL (ref 8–23)
CO2: 27 mmol/L (ref 22–32)
Calcium: 10.1 mg/dL (ref 8.9–10.3)
Chloride: 104 mmol/L (ref 98–111)
Creatinine, Ser: 0.74 mg/dL (ref 0.44–1.00)
GFR, Estimated: >60 mL/min (ref >60)
Glucose, Bld: 154 mg/dL — ABNORMAL HIGH (ref 70–99)
Potassium: 4.1 mmol/L (ref 3.5–5.1)
Sodium: 142 mmol/L (ref 135–145)
Total Bilirubin: 0.7 mg/dL (ref 0.3–1.2)
Total Protein: 7.2 g/dL (ref 6.5–8.1)

## 2021-01-08 LAB — CBC
HCT: 36.1 % (ref 36.0–46.0)
Hemoglobin: 12.3 g/dL (ref 12.0–15.0)
MCH: 33.7 pg (ref 26.0–34.0)
MCHC: 34.1 g/dL (ref 30.0–36.0)
MCV: 98.9 fL (ref 80.0–100.0)
Platelets: 304 10*3/uL (ref 150–400)
RBC: 3.65 MIL/uL — ABNORMAL LOW (ref 3.87–5.11)
RDW: 14.4 % (ref 11.5–15.5)
WBC: 10.1 10*3/uL (ref 4.0–10.5)
nRBC: 0 % (ref 0.0–0.2)

## 2021-01-08 LAB — RESP PANEL BY RT-PCR (FLU A&B, COVID) ARPGX2
Influenza A by PCR: NEGATIVE
Influenza B by PCR: NEGATIVE
SARS Coronavirus 2 by RT PCR: NEGATIVE

## 2021-01-08 LAB — PREPARE RBC (CROSSMATCH)

## 2021-01-08 LAB — HEMOGLOBIN AND HEMATOCRIT, BLOOD
HCT: 29.6 % — ABNORMAL LOW (ref 36.0–46.0)
HCT: 26.1 % — ABNORMAL LOW (ref 36.0–46.0)
Hemoglobin: 10 g/dL — ABNORMAL LOW (ref 12.0–15.0)
Hemoglobin: 8.7 g/dL — ABNORMAL LOW (ref 12.0–15.0)

## 2021-01-08 LAB — PROTIME-INR
INR: 0.9 (ref 0.8–1.2)
Prothrombin Time: 11.6 seconds (ref 11.4–15.2)

## 2021-01-08 LAB — POC OCCULT BLOOD, ED: Fecal Occult Bld: POSITIVE — AB

## 2021-01-08 MED ORDER — MIDAZOLAM HCL 2 MG/2ML IJ SOLN
INTRAMUSCULAR | Status: AC
  Filled 2021-01-08: qty 2

## 2021-01-08 MED ORDER — DEXTROSE-NACL 5-0.45 % IV SOLN: INTRAVENOUS | Status: DC

## 2021-01-08 MED ORDER — TECHNETIUM TC 99M-LABELED RED BLOOD CELLS IV KIT
19.8000 | PACK | Freq: Once | INTRAVENOUS | Status: AC
  Administered 2021-01-08: 19.8 via INTRAVENOUS

## 2021-01-08 MED ORDER — MIDAZOLAM HCL 2 MG/2ML IJ SOLN
INTRAMUSCULAR | Status: DC | PRN
  Administered 2021-01-08: 1 mg via INTRAVENOUS
  Administered 2021-01-08: 0.5 mg via INTRAVENOUS

## 2021-01-08 MED ORDER — METOPROLOL TARTRATE 5 MG/5ML IV SOLN: 5.0000 mg | Freq: Four times a day (QID) | INTRAVENOUS | Status: DC | PRN

## 2021-01-08 MED ORDER — PEG-KCL-NACL-NASULF-NA ASC-C 100 G PO SOLR
0.5000 | Freq: Once | ORAL | Status: DC
  Filled 2021-01-08: qty 1

## 2021-01-08 MED ORDER — BUDESONIDE 3 MG PO CPEP
6.0000 mg | ORAL_CAPSULE | Freq: Every day | ORAL | Status: DC
  Administered 2021-01-09 – 2021-01-10 (×2): 6 mg via ORAL
  Filled 2021-01-08 (×3): qty 2

## 2021-01-08 MED ORDER — SODIUM CHLORIDE 0.9% IV SOLUTION: Freq: Once | INTRAVENOUS | Status: DC

## 2021-01-08 MED ORDER — FENTANYL CITRATE (PF) 100 MCG/2ML IJ SOLN
INTRAMUSCULAR | Status: DC | PRN
  Administered 2021-01-08: 25 ug via INTRAVENOUS
  Administered 2021-01-08: 50 ug via INTRAVENOUS

## 2021-01-08 MED ORDER — PANTOPRAZOLE SODIUM 40 MG IV SOLR
40.0000 mg | Freq: Two times a day (BID) | INTRAVENOUS | Status: DC
  Administered 2021-01-08 – 2021-01-10 (×4): 40 mg via INTRAVENOUS
  Filled 2021-01-08 (×4): qty 40

## 2021-01-08 MED ORDER — PEG-KCL-NACL-NASULF-NA ASC-C 100 G PO SOLR: 1.0000 | Freq: Once | ORAL | Status: DC

## 2021-01-08 MED ORDER — ONDANSETRON HCL 4 MG/2ML IJ SOLN
4.0000 mg | Freq: Four times a day (QID) | INTRAMUSCULAR | Status: DC | PRN
  Administered 2021-01-08: 4 mg via INTRAVENOUS
  Filled 2021-01-08: qty 2

## 2021-01-08 MED ORDER — ACETAMINOPHEN 650 MG RE SUPP: 650.0000 mg | Freq: Four times a day (QID) | RECTAL | Status: DC | PRN

## 2021-01-08 MED ORDER — PRAVASTATIN SODIUM 20 MG PO TABS
10.0000 mg | ORAL_TABLET | Freq: Every day | ORAL | Status: DC
  Administered 2021-01-09 – 2021-01-10 (×2): 10 mg via ORAL
  Filled 2021-01-08 (×2): qty 1

## 2021-01-08 MED ORDER — FENTANYL CITRATE (PF) 100 MCG/2ML IJ SOLN
INTRAMUSCULAR | Status: AC
  Filled 2021-01-08: qty 2

## 2021-01-08 MED ORDER — IOHEXOL 300 MG/ML  SOLN
50.0000 mL | Freq: Once | INTRAMUSCULAR | Status: AC | PRN
  Administered 2021-01-09: 30 mL via INTRA_ARTERIAL

## 2021-01-08 MED ORDER — IOHEXOL 300 MG/ML  SOLN
75.0000 mL | Freq: Once | INTRAMUSCULAR | Status: AC | PRN
  Administered 2021-01-08: 75 mL via INTRAVENOUS

## 2021-01-08 MED ORDER — SODIUM CHLORIDE 0.9% IV SOLUTION: Freq: Once | INTRAVENOUS | Status: AC

## 2021-01-08 MED ORDER — IOHEXOL 300 MG/ML  SOLN
100.0000 mL | Freq: Once | INTRAMUSCULAR | Status: AC | PRN
  Administered 2021-01-09: 36 mL via INTRA_ARTERIAL

## 2021-01-08 MED ORDER — ACETAMINOPHEN 325 MG PO TABS: 650.0000 mg | ORAL_TABLET | Freq: Four times a day (QID) | ORAL | Status: DC | PRN

## 2021-01-08 MED ORDER — LIDOCAINE HCL 1 % IJ SOLN
INTRAMUSCULAR | Status: AC
  Filled 2021-01-08: qty 20

## 2021-01-08 MED ORDER — IOHEXOL 300 MG/ML  SOLN
100.0000 mL | Freq: Once | INTRAMUSCULAR | Status: AC | PRN
  Administered 2021-01-09: 32 mL via INTRA_ARTERIAL

## 2021-01-08 MED ORDER — FERROUS SULFATE 325 (65 FE) MG PO TABS
325.0000 mg | ORAL_TABLET | Freq: Two times a day (BID) | ORAL | Status: DC
  Administered 2021-01-08 – 2021-01-10 (×4): 325 mg via ORAL
  Filled 2021-01-08 (×4): qty 1

## 2021-01-08 NOTE — Sedation Documentation (Signed)
Spoke with pts nurse, Clyde Canterbury, and requested that she be transported to IR for angiogram.

## 2021-01-08 NOTE — Progress Notes (Signed)
Patient arrived on unit in stable condition. Patient's brief contained a large amount of blood upon assessment. Patient cleaned up. Vital signs taken. See epic for full assessment.

## 2021-01-08 NOTE — ED Notes (Signed)
Ginger ale and chicken broth provided. Brief changed with large Blood clot

## 2021-01-08 NOTE — ED Notes (Signed)
Family at bedside. 

## 2021-01-08 NOTE — ED Provider Notes (Signed)
[place] DEPT Provider Note   CSN: [id number] Arrival date & time: 01/08/21  1107     History Chief Complaint  Patient presents with  . Rectal Bleeding    Kathleen Pope is a 87 y.o. female.  HPI Patient is an 87 year old female with a history of anemia, COPD, diverticulosis, external hemorrhoids, hypertension, lower GI bleed, who presents the emergency department due to rectal bleeding.  Patient states that she felt the need to have a BM today and had 2 bouts of bright red blood in the toilet.  She states there was so much blood that she cannot see if stool was even present.  Reports a history of similar symptoms and has been evaluated by GI in the past for this (Dr. Henrene Pastor).  She is a smoker.  Denies any abdominal pain.  No chest pain or shortness of breath.  Patient has no other complaints at this time.  Colonoscopy in September 2019: Biopsies at the time showed collagenous colitis.  Gastroenterologist: Dr. Scarlette Shorts     Past Medical History:  Diagnosis Date  . Anemia    pt denies, yet takes iron  . Chronic airway obstruction, not elsewhere classified   . COPD (chronic obstructive pulmonary disease) ([place])   . Diverticulosis   . GI hemorrhage from post-treatment cecal ulcer 08/18/2012  . Hemorrhoids   . HTN (hypertension)   . Hypercholesteremia   . Lower GI bleed multiple   AVM's    Patient Active Problem List   Diagnosis Date Noted  . Pleural effusion on right 11/27/2020  . Loose stools 04/06/2017  . De Quervain's disease (tenosynovitis) 03/14/2014  . Rectal bleeding 03/09/2013  . Hematochezia 03/09/2013  . External hemorrhoids 03/08/2013  . Chronic lower GI bleeding 08/18/2012  . GI hemorrhage from post-treatment cecal ulcer 08/18/2012  . AVM (arteriovenous malformation) of colon with hemorrhage 08/18/2012  . HTN (hypertension) 08/18/2012  . Acute blood loss anemia 08/18/2012  . Bruit 08/04/2012  . Mixed hyperlipidemia 08/04/2012   . Cigarette smoker 08/04/2012  . Murmur 08/04/2012    Past Surgical History:  Procedure Laterality Date  . BUNIONECTOMY     left  . COLECTOMY    . COLONOSCOPY  08/10/2012   Procedure: COLONOSCOPY;  Surgeon: Ladene Artist, MD,FACG;  Location: [facility];  Service: Endoscopy;  Laterality: N/A;  . COLONOSCOPY  08/19/2012   Procedure: COLONOSCOPY;  Surgeon: Gatha Mayer, MD;  Location: [facility];  Service: Endoscopy;  Laterality: N/A;  . COLONOSCOPY N/A 03/11/2013   Procedure: COLONOSCOPY;  Surgeon: Irene Shipper, MD;  Location: [facility];  Service: Endoscopy;  Laterality: N/A;  . TONSILLECTOMY       OB History   No obstetric history on file.     Family History  Problem Relation Age of Onset  . Breast cancer Mother   . Lung cancer Father   . Breast cancer Sister   . Anuerysm Brother        in stomach  . Colon cancer Neg Hx   . Esophageal cancer Neg Hx   . Rectal cancer Neg Hx     Social History   Tobacco Use  . Smoking status: Current Every Day Smoker    Packs/day: 1.00    Years: 59.00    Pack years: 59.00    Types: Cigarettes  . Smokeless tobacco: Never Used  . Tobacco comment: tobacco info given 06/09/18  Vaping Use  . Vaping Use: Never used  Substance Use Topics  .  Alcohol use: No  . Drug use: No    Home Medications Prior to Admission medications   Medication Sig Start Date End Date Taking? Authorizing Provider  acetaminophen (TYLENOL) 500 MG tablet Take 1,000 mg by mouth every 6 (six) hours as needed for moderate pain. Pain   Yes [provider]  aspirin EC 81 MG tablet Take 81 mg by mouth every other day.   Yes [provider]  budesonide (ENTOCORT EC) 3 MG 24 hr capsule Take 9mg  for one month; then 6 mg for one month; then 3 mg for one month; then stop Patient taking differently: Take 6 mg by mouth daily. Take 9mg  for one month; then 6 mg for one month; then 3 mg for one month; then stop 03/12/20  Yes Irene Shipper, MD  calcium  carbonate (OS-CAL) 600 MG TABS Take 600 mg by mouth daily.   Yes [provider]  ECHINACEA PO Take 1 capsule by mouth daily.   Yes [provider]  ferrous sulfate 325 (65 FE) MG EC tablet Take 325 mg by mouth 2 (two) times daily.    Yes [provider]  fish oil-omega-3 fatty acids 1000 MG capsule Take 1 g by mouth daily.   Yes [provider]  losartan (COZAAR) 50 MG tablet Take 50 mg by mouth daily.   Yes [provider]  Multiple Vitamin (MULTIVITAMIN) tablet Take 1 tablet by mouth daily.   Yes [provider]  pravastatin (PRAVACHOL) 10 MG tablet Take 10 mg by mouth daily.   Yes [provider]  furosemide (LASIX) 20 MG tablet Take 1 tablet (20 mg total) by mouth daily for 3 days. Patient not taking: Reported on 01/08/2021 09/20/20 09/23/20  Emeline Darling, PA-C    Allergies    Patient has no known allergies.  Review of Systems   Review of Systems  All other systems reviewed and are negative. Ten systems reviewed and are negative for acute change, except as noted in the HPI.   Physical Exam Updated Vital Signs BP (!) 144/87   Pulse (!) 105   Temp 98 F (36.7 C) (Oral)   Resp 20   Ht 5' 1.5" (1.562 m)   Wt 40.4 kg   SpO2 99%   BMI 16.54 kg/m   Physical Exam Vitals and nursing note reviewed.  Constitutional:      General: She is not in acute distress.    Appearance: Normal appearance. She is not ill-appearing, toxic-appearing or diaphoretic.  HENT:     Head: Normocephalic and atraumatic.     Right Ear: External ear normal.     Left Ear: External ear normal.     Nose: Nose normal.     Mouth/Throat:     Mouth: Mucous membranes are moist.     Pharynx: Oropharynx is clear. No oropharyngeal exudate or posterior oropharyngeal erythema.  Eyes:     Extraocular Movements: Extraocular movements intact.  Cardiovascular:     Rate and Rhythm: Regular rhythm. Tachycardia present.     Pulses: Normal pulses.      Heart sounds: Murmur heard.  No friction rub. No gallop.   Pulmonary:     Effort: Pulmonary effort is normal. No respiratory distress.     Breath sounds: Normal breath sounds. No stridor. No wheezing, rhonchi or rales.  Abdominal:     General: Abdomen is flat.     Tenderness: There is no abdominal tenderness.  Genitourinary:    Comments: Female nursing chaperone  present.  Bleeding noted from the anus.  1 external hemorrhoid that is nontender and appears nonthrombosed.  No visible bleeding from the site.  No fissures appreciated.  No palpable internal hemorrhoids noted.  Small amount of bright red blood noted in the rectal vault.  No pain appreciated throughout the exam. Musculoskeletal:        General: Normal range of motion.     Cervical back: Normal range of motion and neck supple. No tenderness.  Skin:    General: Skin is warm and dry.  Neurological:     General: No focal deficit present.     Mental Status: She is alert and oriented to person, place, and time.  Psychiatric:        Mood and Affect: Mood normal.        Behavior: Behavior normal.    ED Results / Procedures / Treatments   Labs (all labs ordered are listed, but only abnormal results are displayed) Labs Reviewed  COMPREHENSIVE METABOLIC PANEL - Abnormal; Notable for the following components:      Result Value   Glucose, Bld 154 (*)    All other components within normal limits  CBC - Abnormal; Notable for the following components:   RBC 3.65 (*)    All other components within normal limits  POC OCCULT BLOOD, ED - Abnormal; Notable for the following components:   Fecal Occult Bld POSITIVE (*)    All other components within normal limits  PROTIME-INR  TYPE AND SCREEN   EKG EKG Interpretation  Date/Time:  Thursday January 08 2021 11:42:37 EST Ventricular Rate:  102 PR Interval:    QRS Duration: 90 QT Interval:  384 QTC Calculation: 501 R Axis:   -6 Text Interpretation: Sinus tachycardia Consider left  ventricular hypertrophy Prolonged QT interval When compared to prior, similar appreance with slightly longer QTc. No STEMI Confirmed by Antony Blackbird [id number]) on 01/08/2021 12:25:29 PM   Radiology CT ABDOMEN PELVIS W CONTRAST  Result Date: 01/08/2021 CLINICAL DATA:  [place]. EXAM: CT ABDOMEN AND PELVIS WITH CONTRAST TECHNIQUE: Multidetector CT imaging of the abdomen and pelvis was performed using the standard protocol following bolus administration of intravenous contrast. CONTRAST:  61mL OMNIPAQUE IOHEXOL 300 MG/ML  SOLN COMPARISON:  None. FINDINGS: Lower chest: Minimal left pleural effusion is noted. Bibasilar subsegmental atelectasis is noted. Hepatobiliary: No focal liver abnormality is seen. No gallstones, gallbladder wall thickening, or biliary dilatation. Pancreas: Unremarkable. No pancreatic ductal dilatation or surrounding inflammatory changes. Spleen: Normal in size without focal abnormality. Adrenals/Urinary Tract: Adrenal glands are unremarkable. Kidneys are normal, without renal calculi, focal lesion, or hydronephrosis. Bladder is unremarkable. Stomach/Bowel: The stomach and appendix appear normal. No small bowel dilatation is noted. Postsurgical changes are seen involving the descending colon. The colon is dilated and fluid-filled. Moderate amount of stool is seen throughout the colon. No definite wall thickening is noted. Vascular/Lymphatic: Aortic atherosclerosis. No enlarged abdominal or pelvic lymph nodes. Reproductive: Uterus and bilateral adnexa are unremarkable. Other: No abdominal wall hernia or abnormality. No abdominopelvic ascites. Musculoskeletal: Grade 1 anterolisthesis of L5-S1 is noted secondary to bilateral L5 spondylolysis. No acute osseous abnormality is noted. IMPRESSION: 1. Minimal left pleural effusion is noted with bibasilar subsegmental atelectasis. 2. Postsurgical changes are seen involving the descending colon. The colon is dilated and fluid-filled. Moderate amount of stool  is seen throughout the colon. No definite wall thickening is noted. 3. Grade 1 anterolisthesis of L5-S1 secondary to bilateral L5 spondylolysis. Aortic Atherosclerosis (ICD10-I70.0). Electronically Signed   By:  Marijo Conception M.D.   On: 01/08/2021 14:20    Procedures Procedures   Medications Ordered in ED Medications  iohexol (OMNIPAQUE) 300 MG/ML solution 75 mL (75 mLs Intravenous Contrast Given 01/08/21 1252)    ED Course  I have reviewed the triage vital signs and the nursing notes.  Pertinent labs & imaging results that were available during my care of the patient were reviewed by me and considered in my medical decision making (see chart for details).    MDM Rules/Calculators/A&P                          Pt is a 87 y.o. female who presents the emergency department due to 2 episodes of gross hematochezia.  Labs: CBC with RBCs of 3.65.  Otherwise no abnormalities.  Hemoglobin of 12.3. CMP with a glucose of 154, otherwise no abnormalities. Fecal occult is positive.  Patient had gross hematochezia on my exam. PT/INR within normal limits.  Imaging: CT scan of the abdomen and pelvis shows minimal left pleural effusion with bibasilar subsegmental atelectasis.  Postsurgical changes are seen involving the descending colon.  The colon is dilated and fluid-filled.  There is a moderate amount of stool throughout the colon.  No definite wall thickening.  I, Rayna Sexton, PA-C, personally reviewed and evaluated these images and lab results as part of my medical decision-making.  Patient discussed with [facility] gastroenterology.  On reevaluation patient notes that she had another stool earlier today since arriving to the emergency department with a moderate amount of blood.  With patient's active rectal bleeding, complicated medical history, as well as her age, feel that admission is warranted and they are in agreement.  Recommended admission for observation.  They will consult on the  patient and likely evaluate her later this afternoon.  Note: Portions of this report may have been transcribed using voice recognition software. Every effort was made to ensure accuracy; however, inadvertent computerized transcription errors may be present.   Final Clinical Impression(s) / ED Diagnoses Final diagnoses:  Rectal bleeding    Rx / DC Orders ED Discharge Orders    None       Rayna Sexton, PA-C 01/08/21 1451    Tegeler, Gwenyth Allegra, MD 01/12/21 [phone]

## 2021-01-08 NOTE — Progress Notes (Signed)
GI UPDATE  I called Dr. Clydene Fake of radiology who interpreted tagged RBC study this evening. There is active bleeding in the sigmoid colon. This appears to be coming from a diverticulum 7cm proximal to the surgical anastomosis. We reviewed the CT scan that was done earlier today, there was also apparently active extravasation at that site on the CT where a diverticulum is appreciated.   I called the patient's nurse and spoke with the patient as well over the phone. She continues to have ongoing bleeding symptoms. Her hemoglobin has dropped from admission value of 12.3 to 8.7 this evening. Her blood pressure within the past hour has decreased, hypotensive to 20U systolic. I discussed the situation with the patient. I have called Dr. Jarvis Newcomer of IR who is on call tonight to evaluate the patient for embolization to treat this suspected diverticular bleed. We discussed the imaging findings, he graciously agreed to evaluate the patient for embolization. Patient is agreeable following discussion of this on the phone with her. Her renal function is fortunately normal. She will remain NPO, cancel bowel prep at this time given she is going for IR intervention. Patient is nauseated, provided some Zofran. Primary service has ordered PRBC transfusion given ongoing bleeding with and hypotension. She should be monitored closely and If hemodynamics do not improve or worsens despite resuscitation she needs to go to the ICU.  Call with questions, appreciate IR assistance in this case.  [place] Cellar, MD [facility]

## 2021-01-08 NOTE — ED Triage Notes (Signed)
Patient reports that she has 2 episodes of bright red blood when having a BM today.

## 2021-01-08 NOTE — H&P (Signed)
History and Physical        Hospital Admission Note Date: 01/08/2021  Patient name: Kathleen Pope Medical record number: [id number] Date of birth: 05-04-1933 Age: 87 y.o. Gender: female  PCP: Irene Shipper, MD   Chief Complaint    Chief Complaint  Patient presents with  . Rectal Bleeding      HPI:   This is an 87 year old female with past medical history of hypertension, hyperlipidemia, mild aortic stenosis, COPD, tobacco use, lower GI bleed from AVMs, hemorrhoids who presented to the ED due to painless rectal bleeding.  Patient felt the need to have a BM today and had 2 bouts of bright red blood BMs this a.m.  Was evaluated by GI in the past for similar symptoms.  Patient had a colonoscopy in 2019 with biopsies that showed collagenous colitis and was started on budesonide at the time. She states that she has been having further bleeding while in the ED. She takes aspirin every other day and is not on anticoagulation. She denies other NSAID use. No other complaints.    ED Course: Afebrile, tachycardic, hemodynamically stable, on room air. Notable Labs: Hb 12.3, FOBT positive, flu and COVID-19 pending. Notable Imaging: CT abdomen pelvis with contrast-minimal left pleural effusion with bibasilar subsegmental atelectasis, aortic atherosclerosis, L5-S1 grade 1 anterolisthesis and postsurgical changes involving the descending colon with moderate amount of stool seen.  GI was consulted in the ED.  Vitals:   01/08/21 1530 01/08/21 1600  BP: 114/76   Pulse: (!) 110   Resp: 20 20  Temp: 98 F (36.7 C)   SpO2: 98%      Review of Systems:  Review of Systems  All other systems reviewed and are negative.   Medical/Social/Family History   Past Medical History: Past Medical History:  Diagnosis Date  . Anemia    pt denies, yet takes iron  . Chronic airway obstruction, not elsewhere  classified   . COPD (chronic obstructive pulmonary disease) ([place])   . Diverticulosis   . GI hemorrhage from post-treatment cecal ulcer 08/18/2012  . Hemorrhoids   . HTN (hypertension)   . Hypercholesteremia   . Lower GI bleed multiple   AVM's    Past Surgical History:  Procedure Laterality Date  . BUNIONECTOMY     left  . COLECTOMY    . COLONOSCOPY  08/10/2012   Procedure: COLONOSCOPY;  Surgeon: Ladene Artist, MD,FACG;  Location: [facility];  Service: Endoscopy;  Laterality: N/A;  . COLONOSCOPY  08/19/2012   Procedure: COLONOSCOPY;  Surgeon: Gatha Mayer, MD;  Location: [facility];  Service: Endoscopy;  Laterality: N/A;  . COLONOSCOPY N/A 03/11/2013   Procedure: COLONOSCOPY;  Surgeon: Irene Shipper, MD;  Location: [facility];  Service: Endoscopy;  Laterality: N/A;  . TONSILLECTOMY      Medications: Prior to Admission medications   Medication Sig Start Date End Date Taking? Authorizing Provider  acetaminophen (TYLENOL) 500 MG tablet Take 1,000 mg by mouth every 6 (six) hours as needed for moderate pain. Pain   Yes [provider]  aspirin EC 81 MG tablet Take 81 mg by mouth every other day.   Yes [provider]  budesonide (ENTOCORT EC) 3  MG 24 hr capsule Take 9mg  for one month; then 6 mg for one month; then 3 mg for one month; then stop Patient taking differently: Take 6 mg by mouth daily. Take 9mg  for one month; then 6 mg for one month; then 3 mg for one month; then stop 03/12/20  Yes Irene Shipper, MD  calcium carbonate (OS-CAL) 600 MG TABS Take 600 mg by mouth daily.   Yes [provider]  ECHINACEA PO Take 1 capsule by mouth daily.   Yes [provider]  ferrous sulfate 325 (65 FE) MG EC tablet Take 325 mg by mouth 2 (two) times daily.    Yes [provider]  fish oil-omega-3 fatty acids 1000 MG capsule Take 1 g by mouth daily.   Yes [provider]  losartan (COZAAR) 50 MG tablet Take 50 mg by mouth daily.   Yes  [provider]  Multiple Vitamin (MULTIVITAMIN) tablet Take 1 tablet by mouth daily.   Yes [provider]  pravastatin (PRAVACHOL) 10 MG tablet Take 10 mg by mouth daily.   Yes [provider]  furosemide (LASIX) 20 MG tablet Take 1 tablet (20 mg total) by mouth daily for 3 days. Patient not taking: Reported on 01/08/2021 09/20/20 09/23/20  Sponseller, Gypsy Balsam, PA-C    Allergies:  No Known Allergies  Social History:  reports that she has been smoking cigarettes. She has a 59.00 pack-year smoking history. She has never used smokeless tobacco. She reports that she does not drink alcohol and does not use drugs.  Family History: Family History  Problem Relation Age of Onset  . Breast cancer Mother   . Lung cancer Father   . Breast cancer Sister   . Anuerysm Brother        in stomach  . Colon cancer Neg Hx   . Esophageal cancer Neg Hx   . Rectal cancer Neg Hx      Objective   Physical Exam: Blood pressure 114/76, pulse (!) 110, temperature 98 F (36.7 C), resp. rate 20, height 5' 1.5" (1.562 m), weight 40.4 kg, SpO2 98 %.  Physical Exam Vitals and nursing note reviewed.  Constitutional:      Appearance: Normal appearance.  HENT:     Head: Normocephalic and atraumatic.  Eyes:     Conjunctiva/sclera: Conjunctivae normal.  Cardiovascular:     Rate and Rhythm: Normal rate and regular rhythm.     Heart sounds: Murmur heard.    Pulmonary:     Effort: Pulmonary effort is normal.     Breath sounds: Normal breath sounds.  Abdominal:     General: Abdomen is flat.     Palpations: Abdomen is soft.  Musculoskeletal:        General: No swelling or tenderness.  Skin:    Coloration: Skin is not jaundiced or pale.  Neurological:     Mental Status: She is alert. Mental status is at baseline.  Psychiatric:        Mood and Affect: Mood normal.        Behavior: Behavior normal.     LABS on Admission: I have personally reviewed all the labs and imaging  below    Basic Metabolic Panel: Recent Labs  Lab 01/08/21 1140  NA 142  K 4.1  CL 104  CO2 27  GLUCOSE 154*  BUN 14  CREATININE 0.74  CALCIUM 10.1   Liver Function Tests: Recent Labs  Lab 01/08/21 1140  AST 23  ALT 17  ALKPHOS 57  BILITOT 0.7  PROT 7.2  ALBUMIN 4.0   No results for input(s): LIPASE, AMYLASE in the last 168 hours. No results for input(s): AMMONIA in the last 168 hours. CBC: Recent Labs  Lab 01/08/21 1140 01/08/21 1542  WBC 10.1  --   HGB 12.3 10.0*  HCT 36.1 29.6*  MCV 98.9  --   PLT 304  --    Cardiac Enzymes: No results for input(s): CKTOTAL, CKMB, CKMBINDEX, TROPONINI in the last 168 hours. BNP: Invalid input(s): POCBNP CBG: No results for input(s): GLUCAP in the last 168 hours.  Radiological Exams on Admission:  CT ABDOMEN PELVIS W CONTRAST  Result Date: 01/08/2021 CLINICAL DATA:  [place]. EXAM: CT ABDOMEN AND PELVIS WITH CONTRAST TECHNIQUE: Multidetector CT imaging of the abdomen and pelvis was performed using the standard protocol following bolus administration of intravenous contrast. CONTRAST:  71mL OMNIPAQUE IOHEXOL 300 MG/ML  SOLN COMPARISON:  None. FINDINGS: Lower chest: Minimal left pleural effusion is noted. Bibasilar subsegmental atelectasis is noted. Hepatobiliary: No focal liver abnormality is seen. No gallstones, gallbladder wall thickening, or biliary dilatation. Pancreas: Unremarkable. No pancreatic ductal dilatation or surrounding inflammatory changes. Spleen: Normal in size without focal abnormality. Adrenals/Urinary Tract: Adrenal glands are unremarkable. Kidneys are normal, without renal calculi, focal lesion, or hydronephrosis. Bladder is unremarkable. Stomach/Bowel: The stomach and appendix appear normal. No small bowel dilatation is noted. Postsurgical changes are seen involving the descending colon. The colon is dilated and fluid-filled. Moderate amount of stool is seen throughout the colon. No definite wall thickening is  noted. Vascular/Lymphatic: Aortic atherosclerosis. No enlarged abdominal or pelvic lymph nodes. Reproductive: Uterus and bilateral adnexa are unremarkable. Other: No abdominal wall hernia or abnormality. No abdominopelvic ascites. Musculoskeletal: Grade 1 anterolisthesis of L5-S1 is noted secondary to bilateral L5 spondylolysis. No acute osseous abnormality is noted. IMPRESSION: 1. Minimal left pleural effusion is noted with bibasilar subsegmental atelectasis. 2. Postsurgical changes are seen involving the descending colon. The colon is dilated and fluid-filled. Moderate amount of stool is seen throughout the colon. No definite wall thickening is noted. 3. Grade 1 anterolisthesis of L5-S1 secondary to bilateral L5 spondylolysis. Aortic Atherosclerosis (ICD10-I70.0). Electronically Signed   By: Marijo Conception M.D.   On: 01/08/2021 14:20      EKG: unchanged from previous tracings, normal sinus rhythm, prolonged QT interval   A & P   Active Problems:   Mixed hyperlipidemia   Cigarette smoker   Murmur   AVM (arteriovenous malformation) of colon   HTN (hypertension)   GI bleed   1. Painless Lower GI bleed with history of AVMs and hemorrhoids a. Tachycardic and hemodynamically stable b. Trend H/h, transfuse for Hb < 8 c. Holding aspirin d. GI consulted: Stat nuc med bleeding scan and if positive then she may need to go to CT angio for embolization. If negative, plan is for bowel prep then colonoscopy early tomorrow. CLD after bleeding scan  2. Mild AS a. Asymptomatic and follows with cardiology outpatient  3. COPD, not in acute exacerbation  Tobacco use a. Outpatient pulm follow up as scheduled b. Encourage smoking cessation  4. Hypertension a. Holding home losartan b. PRN lopressor  5. Hyperlipidemia  Asymptomatic carotid artery disease a. She is on aspirin for this b. Continue home statin c. Outpatient follow up with vascular    DVT prophylaxis: SCDs   Code Status: DNR   Diet: clear liquid diet and NPO after midnight  Family Communication: Admission, patients condition and plan of care  including tests being ordered have been discussed with the patient who indicates understanding and agrees with the plan and Code Status.   Disposition Plan: The appropriate patient status for this patient is OBSERVATION. Observation status is judged to be reasonable and necessary in order to provide the required intensity of service to ensure the patient's safety. The patient's presenting symptoms, physical exam findings, and initial radiographic and laboratory data in the context of their medical condition is felt to place them at decreased risk for further clinical deterioration. Furthermore, it is anticipated that the patient will be medically stable for discharge from the hospital within 2 midnights of admission. The following factors support the patient status of observation.   " The patient's presenting symptoms include GI bleed. " The physical exam findings include unremarkable. " The initial radiographic and laboratory data are positive FOBT.    Status is: Observation  The patient remains OBS appropriate and will d/c before 2 midnights.  Dispo: The patient is from: Home              Anticipated d/c is to: Home              Patient currently is not medically stable to d/c.   Difficult to place patient No      Consultants  . GI  Procedures  . none  Time Spent on Admission: 66 minutes    Harold Hedge, DO [facility]  01/08/2021, 4:51 PM

## 2021-01-08 NOTE — Consult Note (Addendum)
Attending physician's note   I have taken an interval history, reviewed the chart and examined the patient. I agree with the Advanced Practitioner's note, impression, and recommendations as outlined.   87 year old female with medical history as outlined below presents with acute, recurrent GI bleed, described as painless hematochezia.  Symptoms similar to previous episodes, last of which was 2014.  Interestingly, underwent sigmoid resection 2001 for presumed diverticular bleeding.  Was readmitted in 07/2000 for recurrent bleeding.   -Colonoscopy at that time with actively bleeding cecal AVM treated with cautery. -EGD with benign esophageal ring, otherwise unremarkable.  -Interval colonoscopy in 2006 was normal. -Colonoscopy 06/2006 again with active bleeding cecal AVM x2 treated with cautery -Colonoscopy in 07/2012 for active bleeding n/f cecal AVM x3 treated with APC. -Colonoscopy in 08/2012 with ulcer with VV in cecum treated with hemostatic clips and epinephrine -Colonoscopy in 02/2013 (for bleeding) was without active bleeding and no diverticulosis, healthy-appearing sigmoid anastomosis. -Colonoscopy in 07/2018 as outpatient for diarrhea notable for collagenous colitis.  No AVMs noted.  Of note, no diverticular disease noted on any of the above colonoscopies.  Internal hemorrhoids noted on each of the colonoscopies.  Admission hemoglobin 12.3, which was 13.1 on 12/04/2020.  Repeat hemoglobin 10.0.  Otherwise hemodynamically stable.  CT unremarkable.  1) Hematochezia 2) History of colonic AVMs 3) Acute blood loss anemia 4) Internal hemorrhoids 5) History of sigmoid resection  -Tagged RBC scan to help localize and if positive IR consultation for directed embolization -Otherwise plan for colonoscopy tomorrow afternoon with Kathleen Pope for probable ablation of AVMs -Serial H/H checks as ordered with blood products as needed per protocol -N.p.o. at midnight for probable procedure  tomorrow  6) History of collagenous colitis -Stable on budesonide   Kathleen Heck, DO, [place] [phone] office          Consultation  Referring Provider:  [facility] Primary Care Physician:  Kathleen Shipper, MD Primary Gastroenterologist:  Kathleen Pope  Reason for Consultation:  Rectal bleeding   HPI: Kathleen Pope is a 87 y.o. female, known to Kathleen Pope with history of recurrent GI bleeding, and collagenous colitis.  She also has history of COPD, hypertension, aortic stenosis and is status post remote sigmoid colectomy done at [facility].  She says this was done for GI bleeding that was felt to be diverticular in etiology. Patient presents to the emergency room today after acute onset of bright red blood per rectum this morning.  She says she had urge for bowel movement, passed very little stool and a lot of red blood.  About 5 minutes later she had a second episode.  She was feeling a little weak and shaky and presented to the emergency room. She has not noticed any recent dark stools or hematochezia.  She has been on budesonide at 6 mg over the past couple of months that she has had some recurrence of diarrhea secondary to collagenous colitis.  Her symptoms have been under good control having 1-2 formed stools per day.  She has not been having any abdominal pain or cramping and did not have any pain or cramping today. She has been taking a baby aspirin every other day she does not use any NSAIDs.  No blood thinners. Is hemodynamically stable although tachycardic currently with a pulse of 116 and blood pressure of 100/70.  CT scan was done through the emergency room which shows minimal left effusion and bibasilar segmental atelectasis, prior surgical changes involving the descending colon,  colon somewhat dilated and fluid-filled no definite wall thickening.  Lab-WBC 10.1, hemoglobin 12.3/hematocrit 36.  She had labs done in January 2022 with hemoglobin 13.1  INR 0.9 be met  unremarkable, and LFTs within normal limits.  Patient says she can tell that she is still bleeding as she feels the blood oozing.  She is wearing a pad and on examination she has 150 to 200 cc of dark red blood and some clots.   Last colonoscopy done for diarrhea in September 2019 (normal biopsies consistent with collagenous colitis.  Prior to that colonoscopy in 2014 done for bleeding showed no evidence of diverticulosis she did have grade 2 internal hemorrhoids and sigmoid anastomosis noted. Colonoscopy September 2013 done for bleeding revealed 3 cecal AVMs which were treated with APC and grade 2 internal hemorrhoids.     Past Medical History:  Diagnosis Date  . Anemia    pt denies, yet takes iron  . Chronic airway obstruction, not elsewhere classified   . COPD (chronic obstructive pulmonary disease) ([place])   . Diverticulosis   . GI hemorrhage from post-treatment cecal ulcer 08/18/2012  . Hemorrhoids   . HTN (hypertension)   . Hypercholesteremia   . Lower GI bleed multiple   AVM's    Past Surgical History:  Procedure Laterality Date  . BUNIONECTOMY     left  . COLECTOMY    . COLONOSCOPY  08/10/2012   Procedure: COLONOSCOPY;  Surgeon: Ladene Artist, MD,FACG;  Location: [facility];  Service: Endoscopy;  Laterality: N/A;  . COLONOSCOPY  08/19/2012   Procedure: COLONOSCOPY;  Surgeon: Gatha Mayer, MD;  Location: [facility];  Service: Endoscopy;  Laterality: N/A;  . COLONOSCOPY N/A 03/11/2013   Procedure: COLONOSCOPY;  Surgeon: Kathleen Shipper, MD;  Location: [facility];  Service: Endoscopy;  Laterality: N/A;  . TONSILLECTOMY      Prior to Admission medications   Medication Sig Start Date End Date Taking? Authorizing Provider  acetaminophen (TYLENOL) 500 MG tablet Take 1,000 mg by mouth every 6 (six) hours as needed for moderate pain. Pain   Yes [provider]  aspirin EC 81 MG tablet Take 81 mg by mouth every other day.   Yes [provider]  budesonide  (ENTOCORT EC) 3 MG 24 hr capsule Take 33m for one month; then 6 mg for one month; then 3 mg for one month; then stop Patient taking differently: Take 6 mg by mouth daily. Take 9108mfor one month; then 6 mg for one month; then 3 mg for one month; then stop 03/12/20  Yes PeIrene ShipperMD  calcium carbonate (OS-CAL) 600 MG TABS Take 600 mg by mouth daily.   Yes [provider]  ECHINACEA PO Take 1 capsule by mouth daily.   Yes [provider]  ferrous sulfate 325 (65 FE) MG EC tablet Take 325 mg by mouth 2 (two) times daily.    Yes [provider]  fish oil-omega-3 fatty acids 1000 MG capsule Take 1 g by mouth daily.   Yes [provider]  losartan (COZAAR) 50 MG tablet Take 50 mg by mouth daily.   Yes [provider]  Multiple Vitamin (MULTIVITAMIN) tablet Take 1 tablet by mouth daily.   Yes [provider]  pravastatin (PRAVACHOL) 10 MG tablet Take 10 mg by mouth daily.   Yes [provider]  furosemide (LASIX) 20 MG tablet Take 1 tablet (20 mg total) by mouth daily for 3 days. Patient not taking: Reported on  01/08/2021 09/20/20 09/23/20  Sponseller, Eugene Garnet R, PA-C    No current facility-administered medications for this encounter.   Current Outpatient Medications  Medication Sig Dispense Refill  . acetaminophen (TYLENOL) 500 MG tablet Take 1,000 mg by mouth every 6 (six) hours as needed for moderate pain. Pain    . aspirin EC 81 MG tablet Take 81 mg by mouth every other day.    . budesonide (ENTOCORT EC) 3 MG 24 hr capsule Take 8m for one month; then 6 mg for one month; then 3 mg for one month; then stop (Patient taking differently: Take 6 mg by mouth daily. Take 970mfor one month; then 6 mg for one month; then 3 mg for one month; then stop) 180 capsule 1  . calcium carbonate (OS-CAL) 600 MG TABS Take 600 mg by mouth daily.    . Marland KitchenCHINACEA PO Take 1 capsule by mouth daily.    . ferrous sulfate 325 (65 FE) MG EC tablet Take 325 mg by  mouth 2 (two) times daily.     . fish oil-omega-3 fatty acids 1000 MG capsule Take 1 g by mouth daily.    . Marland Kitchenosartan (COZAAR) 50 MG tablet Take 50 mg by mouth daily.    . Multiple Vitamin (MULTIVITAMIN) tablet Take 1 tablet by mouth daily.    . pravastatin (PRAVACHOL) 10 MG tablet Take 10 mg by mouth daily.    . furosemide (LASIX) 20 MG tablet Take 1 tablet (20 mg total) by mouth daily for 3 days. (Patient not taking: Reported on 01/08/2021) 3 tablet 0    Allergies as of 01/08/2021  . (No Known Allergies)    Family History  Problem Relation Age of Onset  . Breast cancer Mother   . Lung cancer Father   . Breast cancer Sister   . Anuerysm Brother        in stomach  . Colon cancer Neg Hx   . Esophageal cancer Neg Hx   . Rectal cancer Neg Hx     Social History   Socioeconomic History  . Marital status: Widowed    Spouse name: Not on file  . Number of children: 1  . Years of education: Not on file  . Highest education level: Not on file  Occupational History  . Occupation: retired    EmFish farm managerRETIRED  Tobacco Use  . Smoking status: Current Every Day Smoker    Packs/day: 1.00    Years: 59.00    Pack years: 59.00    Types: Cigarettes  . Smokeless tobacco: Never Used  . Tobacco comment: tobacco info given 06/09/18  Vaping Use  . Vaping Use: Never used  Substance and Sexual Activity  . Alcohol use: No  . Drug use: No  . Sexual activity: Never  Other Topics Concern  . Not on file  Social History Narrative  . Not on file   Social Determinants of Health   Financial Resource Strain: Not on file  Food Insecurity: Not on file  Transportation Needs: Not on file  Physical Activity: Not on file  Stress: Not on file  Social Connections: Not on file  Intimate Partner Violence: Not on file    Review of Systems: Pertinent positive and negative review of systems were noted in the above HPI section.  All other review of systems was otherwise negative.. Marland KitchenPhysical  Exam: Vital signs in last 24 hours: Temp:  [98 F (36.7 C)] 98 F (36.7 C) (02/24 1113) Pulse Rate:  [101-126] 105 (02/24  1445) Resp:  [16-23] 20 (02/24 1445) BP: (110-183)/(69-131) 144/87 (02/24 1445) SpO2:  [96 %-100 %] 99 % (02/24 1445) Weight:  [40.4 kg] 40.4 kg (02/24 1115)   General:   Alert,  Well-developed, elderly man white female ,, pleasant and cooperative in NAD Head:  Normocephalic and atraumatic. Eyes:  Sclera clear, no icterus.   Conjunctiva pink. Ears:  Normal auditory acuity. Nose:  No deformity, discharge,  or lesions. Mouth:  No deformity or lesions.   Neck:  Supple; no masses or thyromegaly. Lungs:  Clear throughout to auscultation.  Decreased breath sounds bases  Heart:t achy  Regular rate and rhythm; no murmurs, clicks, rubs,  or gallops. Abdomen:  Soft,nontender, BS active,nonpalp mass or hsm.  Midline incisional scar Rectal: Done per ER with finding of bright red blood oozing, he has 150 cc at least of dark red blood and clots in her pad currently Msk:  Symmetrical without gross deformities. . Pulses:  Normal pulses noted. Extremities:  Without clubbing or edema. Neurologic:  Alert and  oriented x4;  grossly normal neurologically. Skin:  Intact without significant lesions or rashes.. Psych:  Alert and cooperative. Normal mood and affect.  Intake/Output from previous day: No intake/output data recorded. Intake/Output this shift: No intake/output data recorded.  Lab Results: Recent Labs    01/08/21 1140  WBC 10.1  HGB 12.3  HCT 36.1  PLT 304   BMET Recent Labs    01/08/21 1140  NA 142  K 4.1  CL 104  CO2 27  GLUCOSE 154*  BUN 14  CREATININE 0.74  CALCIUM 10.1   LFT Recent Labs    01/08/21 1140  PROT 7.2  ALBUMIN 4.0  AST 23  ALT 17  ALKPHOS 57  BILITOT 0.7   PT/INR Recent Labs    01/08/21 1140  LABPROT 11.6  INR 0.9   Hepatitis Panel No results for input(s): HEPBSAG, HCVAB, HEPAIGM, HEPBIGM in the last 72  hours.   IMPRESSION:   #56 87 year old white female with acute lower GI bleed onset this morning with 2 episodes of bright red blood in the commode.  No associated abdominal pain or cramping patient did have some weakness. He has been hemodynamically stable though is tachycardic with pulse 116. She is continuing to ooze blood-with 150 to 200 cc of dark red blood and clots on her pad  CT scan negative Hemoglobin down from 13.39-monthago to 12.3 today  Prior colonoscopies have not shown any diverticuli, she does have history of cecal AVMs with bleeding in 2013, also has internal hemorrhoids.  Rule out acute bleed secondary to AVMs, rule out diverticular (no significant diverticuli noted on prior imaging or colonoscopies)  #2 history of collagenous colitis-stable on budesonide 6 mg/day over past 2 months with resolution of diarrhea #3 COPD #4 aortic stenosis 5.  Hypertension 6.  Status post remote sigmoid resection for acute bleed felt secondary to diverticular disease   PLAN: Stat nuc med bleeding scan, if positive may need to go to CT angio with embolization If negative, will plan bowel prep and then colonoscopy early tomorrow afternoon with Dr. PHenrene Pope Clear liquids after bleeding scan Serial hemoglobins every 6 hours, transfuse for hemoglobin less than 8 Bedrest Thank you will follow with you   Amy EsterwoodPA-C  01/08/2021, 2:57 PM

## 2021-01-08 NOTE — ED Notes (Signed)
Assisted patient with use of bed pan, moderate amount of blood noted.

## 2021-01-08 NOTE — ED Notes (Signed)
Called transport to move patient to [id number]

## 2021-01-09 DIAGNOSIS — K52831 Collagenous colitis: Secondary | ICD-10-CM | POA: Diagnosis present

## 2021-01-09 DIAGNOSIS — I350 Nonrheumatic aortic (valve) stenosis: Secondary | ICD-10-CM | POA: Diagnosis present

## 2021-01-09 DIAGNOSIS — Z20822 Contact with and (suspected) exposure to covid-19: Secondary | ICD-10-CM | POA: Diagnosis present

## 2021-01-09 DIAGNOSIS — K641 Second degree hemorrhoids: Secondary | ICD-10-CM | POA: Diagnosis present

## 2021-01-09 DIAGNOSIS — Z9049 Acquired absence of other specified parts of digestive tract: Secondary | ICD-10-CM | POA: Diagnosis not present

## 2021-01-09 DIAGNOSIS — Z681 Body mass index (BMI) 19 or less, adult: Secondary | ICD-10-CM | POA: Diagnosis not present

## 2021-01-09 DIAGNOSIS — K625 Hemorrhage of anus and rectum: Secondary | ICD-10-CM | POA: Diagnosis present

## 2021-01-09 DIAGNOSIS — E43 Unspecified severe protein-calorie malnutrition: Secondary | ICD-10-CM | POA: Diagnosis present

## 2021-01-09 DIAGNOSIS — E782 Mixed hyperlipidemia: Secondary | ICD-10-CM | POA: Diagnosis present

## 2021-01-09 DIAGNOSIS — K635 Polyp of colon: Secondary | ICD-10-CM | POA: Diagnosis present

## 2021-01-09 DIAGNOSIS — K5521 Angiodysplasia of colon with hemorrhage: Secondary | ICD-10-CM | POA: Diagnosis present

## 2021-01-09 DIAGNOSIS — K921 Melena: Secondary | ICD-10-CM | POA: Diagnosis not present

## 2021-01-09 DIAGNOSIS — Z66 Do not resuscitate: Secondary | ICD-10-CM | POA: Diagnosis present

## 2021-01-09 DIAGNOSIS — Z79899 Other long term (current) drug therapy: Secondary | ICD-10-CM | POA: Diagnosis not present

## 2021-01-09 DIAGNOSIS — K5520 Angiodysplasia of colon without hemorrhage: Secondary | ICD-10-CM | POA: Diagnosis not present

## 2021-01-09 DIAGNOSIS — I959 Hypotension, unspecified: Secondary | ICD-10-CM | POA: Diagnosis not present

## 2021-01-09 DIAGNOSIS — Z716 Tobacco abuse counseling: Secondary | ICD-10-CM | POA: Diagnosis not present

## 2021-01-09 DIAGNOSIS — F17210 Nicotine dependence, cigarettes, uncomplicated: Secondary | ICD-10-CM | POA: Diagnosis present

## 2021-01-09 DIAGNOSIS — K5730 Diverticulosis of large intestine without perforation or abscess without bleeding: Secondary | ICD-10-CM | POA: Diagnosis present

## 2021-01-09 DIAGNOSIS — I700 Atherosclerosis of aorta: Secondary | ICD-10-CM | POA: Diagnosis present

## 2021-01-09 DIAGNOSIS — J449 Chronic obstructive pulmonary disease, unspecified: Secondary | ICD-10-CM | POA: Diagnosis present

## 2021-01-09 DIAGNOSIS — R935 Abnormal findings on diagnostic imaging of other abdominal regions, including retroperitoneum: Secondary | ICD-10-CM

## 2021-01-09 DIAGNOSIS — I10 Essential (primary) hypertension: Secondary | ICD-10-CM | POA: Diagnosis present

## 2021-01-09 DIAGNOSIS — K644 Residual hemorrhoidal skin tags: Secondary | ICD-10-CM | POA: Diagnosis present

## 2021-01-09 DIAGNOSIS — D62 Acute posthemorrhagic anemia: Secondary | ICD-10-CM | POA: Diagnosis present

## 2021-01-09 DIAGNOSIS — J9811 Atelectasis: Secondary | ICD-10-CM | POA: Diagnosis present

## 2021-01-09 DIAGNOSIS — K922 Gastrointestinal hemorrhage, unspecified: Secondary | ICD-10-CM | POA: Diagnosis not present

## 2021-01-09 DIAGNOSIS — Z7951 Long term (current) use of inhaled steroids: Secondary | ICD-10-CM | POA: Diagnosis not present

## 2021-01-09 DIAGNOSIS — Z7982 Long term (current) use of aspirin: Secondary | ICD-10-CM | POA: Diagnosis not present

## 2021-01-09 HISTORY — PX: IR US GUIDE VASC ACCESS RIGHT: IMG2390

## 2021-01-09 HISTORY — PX: IR ANGIOGRAM VISCERAL SELECTIVE: IMG657

## 2021-01-09 LAB — BASIC METABOLIC PANEL
Anion gap: 8 (ref 5–15)
BUN: 16 mg/dL (ref 8–23)
CO2: 27 mmol/L (ref 22–32)
Calcium: 8.3 mg/dL — ABNORMAL LOW (ref 8.9–10.3)
Chloride: 105 mmol/L (ref 98–111)
Creatinine, Ser: 0.81 mg/dL (ref 0.44–1.00)
GFR, Estimated: >60 mL/min (ref >60)
Glucose, Bld: 119 mg/dL — ABNORMAL HIGH (ref 70–99)
Potassium: 5.1 mmol/L (ref 3.5–5.1)
Sodium: 140 mmol/L (ref 135–145)

## 2021-01-09 LAB — CBC
HCT: 32.9 % — ABNORMAL LOW (ref 36.0–46.0)
Hemoglobin: 11 g/dL — ABNORMAL LOW (ref 12.0–15.0)
MCH: 31.3 pg (ref 26.0–34.0)
MCHC: 33.4 g/dL (ref 30.0–36.0)
MCV: 93.5 fL (ref 80.0–100.0)
Platelets: 198 10*3/uL (ref 150–400)
RBC: 3.52 MIL/uL — ABNORMAL LOW (ref 3.87–5.11)
RDW: 15.6 % — ABNORMAL HIGH (ref 11.5–15.5)
WBC: 8.3 10*3/uL (ref 4.0–10.5)
nRBC: 0 % (ref 0.0–0.2)

## 2021-01-09 LAB — HEMOGLOBIN AND HEMATOCRIT, BLOOD
HCT: 32.9 % — ABNORMAL LOW (ref 36.0–46.0)
HCT: 29.2 % — ABNORMAL LOW (ref 36.0–46.0)
HCT: 33.2 % — ABNORMAL LOW (ref 36.0–46.0)
Hemoglobin: 11.1 g/dL — ABNORMAL LOW (ref 12.0–15.0)
Hemoglobin: 10.2 g/dL — ABNORMAL LOW (ref 12.0–15.0)
Hemoglobin: 11.6 g/dL — ABNORMAL LOW (ref 12.0–15.0)

## 2021-01-09 LAB — PREPARE RBC (CROSSMATCH)

## 2021-01-09 MED ORDER — PEG-KCL-NACL-NASULF-NA ASC-C 100 G PO SOLR
0.5000 | ORAL | Status: AC
  Administered 2021-01-09 (×2): 100 g via ORAL
  Filled 2021-01-09: qty 1

## 2021-01-09 NOTE — Progress Notes (Addendum)
PROGRESS NOTE   Kathleen Pope  IRJ:[id number] DOB: 03/25/1933 DOA: 01/08/2021 PCP: Irene Shipper, MD  Brief Narrative:   87 y/o wf community dwelling Mild A OS, COPD presumed, HTN, HLD Recurrent acute GI bleeds painless hematochezia despite sigmoid resection 2001+ known cecal AVMs followed by [facility]  Last colonoscopy 2019 collagenous colitis-on budesonide chronically-does not take NSAIDs  Found to be tachycardic afebrile-2/2 listed history GI recommended stat nuclear bleeding scan Bleeding scan +, however on angiogram no target for IR embolization 2/24 GI determining next steps  Hospital-Problem based course  Hematochezia 2/2 cecal AVM status post [-] SMA angiogram 2/24 Prior collagenous colitis  Defer to GI next steps/diet/Entocort etc.-?  Colonoscopy 2/26  anemia of acute blood loss from GI bleed  Transfusion threshold less than 7  Labs in a.m.   q6 hourly hemoglobin today Mild AOS per echo 2013  Outpatient repeat in 3 to 6 months-has holosystolic murmur  No alarm symptoms at this time  Holding Lasix/losartan 50 mg daily from PTA  COPD presumed-former smoker  Asymptomatic HTN  Holding meds as above HLD  DVT prophylaxis: SCD Code Status: Full Family Communication: None currently present at bedside Disposition:  Status is: Observation  The patient will require care spanning > 2 midnights and should be moved to inpatient because: Hemodynamically unstable, Persistent severe electrolyte disturbances, Ongoing active pain requiring inpatient pain management and Unsafe d/c plan  Dispo: The patient is from: Home              Anticipated d/c is to: Home              Patient currently is not medically stable to d/c.   Difficult to place patient Yes   Consultants:   GI  Procedures:   SMA arteriogram 2/25 without bleeding  Antimicrobials:     Subjective:  No further bleeding reported per RN-labs reviewed and stable Patient herself is quite oriented does not feel  lightheaded does not have chest pain chills or fever   Objective: Vitals:   01/09/21 0346 01/09/21 0452 01/09/21 0615 01/09/21 0910  BP: 105/66 123/80 117/67 137/70  Pulse: 80 92 72 79  Resp: _0 Temp: 98 F (36.7 C) 98.2 F (36.8 C) 98.6 F (37 C) 98.3 F (36.8 C)  TempSrc: Oral Oral Oral Oral  SpO2: 99% 99% 95% 99%  Weight:      Height:        Intake/Output Summary (Last 24 hours) at 01/09/2021 1016 Last data filed at 01/09/2021 0600 Gross per 24 hour  Intake 1417 ml  Output --  Net 1417 ml   Filed Weights   01/08/21 1115  Weight: 40.4 kg    Examination:  Awake coherent no distress EOMI NCAT no focal deficit CTA B no added sound no rales no rhonchi Abdomen soft nontender no rebound no guarding ROM intact moving all 4 limbs equally No lower extremity edema Systolic murmur across precordium 3/6  Data Reviewed: personally reviewed   CBC    Component Value Date/Time   WBC 8.3 01/09/2021 0815   RBC 3.52 (L) 01/09/2021 0815   HGB 11.1 (L) 01/09/2021 0815   HGB 11.0 (L) 01/09/2021 0815   HCT 32.9 (L) 01/09/2021 0815   HCT 32.9 (L) 01/09/2021 0815   PLT 198 01/09/2021 0815   MCV 93.5 01/09/2021 0815   MCH 31.3 01/09/2021 0815   MCHC 33.4 01/09/2021 0815   RDW 15.6 (H) 01/09/2021 0815   LYMPHSABS 1.5 12/04/2020  1301   MONOABS 0.6 12/04/2020 1301   EOSABS 0.2 12/04/2020 1301   BASOSABS 0.0 12/04/2020 1301   CMP Latest Ref Rng & Units 01/09/2021 01/08/2021 12/04/2020  Glucose 70 - 99 mg/dL 119(H) 154(H) 134(H)  BUN 8 - 23 mg/dL _0 Creatinine 0.44 - 1.00 mg/dL 0.81 0.74 0.75  Sodium 135 - 145 mmol/L 140 142 139  Potassium 3.5 - 5.1 mmol/L 5.1 4.1 3.3(L)  Chloride 98 - 111 mmol/L 105 104 104  CO2 22 - 32 mmol/L _1 Calcium 8.9 - 10.3 mg/dL 8.3(L) 10.1 9.7  Total Protein 6.5 - 8.1 g/dL - 7.2 -  Total Bilirubin 0.3 - 1.2 mg/dL - 0.7 -  Alkaline Phos 38 - 126 U/L - 57 -  AST 15 - 41 U/L - 23 -  ALT 0 - 44 U/L - 17 -     Radiology  Studies: NM GI Blood Loss  Addendum Date: 01/08/2021   ADDENDUM REPORT: 01/08/2021 22:08 ADDENDUM: These results were discussed by telephone at the time of interpretation on 01/08/2021 at 10:07 pm to Dr. Havery Moros, who verbally acknowledged these results. Electronically Signed   By: Anner Crete M.D.   On: 01/08/2021 22:08   Result Date: 01/08/2021 CLINICAL DATA:  87 year old female with lower GI bleed. EXAM: NUCLEAR MEDICINE GASTROINTESTINAL BLEEDING SCAN TECHNIQUE: Sequential abdominal images were obtained following intravenous administration of Tc-1mlabeled red blood cells. RADIOPHARMACEUTICALS:  19.8 mCi Tc-957mertechnetate in-vitro labeled red cells. COMPARISON:  CT abdomen pelvis dated 01/08/2021. FINDINGS: There is physiologic uptake within the liver, kidneys, and cardiac blood pool. Physiologic activity noted in the major vasculature. There is radiotracer activity in the left lower quadrant conforming to the distal descending colon and sigmoid consistent with active GI bleed. Please note active diverticular bleed in the region of the mid descending colon is present on the earlier CT. IMPRESSION: Active GI bleed in the mid to distal descending colon corresponding to the diverticular bleed on the earlier CT. Electronically Signed: By: ArAnner Crete.D. On: 01/08/2021 21:11   CT ABDOMEN PELVIS W CONTRAST  Result Date: 01/08/2021 CLINICAL DATA:  Melena. EXAM: CT ABDOMEN AND PELVIS WITH CONTRAST TECHNIQUE: Multidetector CT imaging of the abdomen and pelvis was performed using the standard protocol following bolus administration of intravenous contrast. CONTRAST:  7581mMNIPAQUE IOHEXOL 300 MG/ML  SOLN COMPARISON:  None. FINDINGS: Lower chest: Minimal left pleural effusion is noted. Bibasilar subsegmental atelectasis is noted. Hepatobiliary: No focal liver abnormality is seen. No gallstones, gallbladder wall thickening, or biliary dilatation. Pancreas: Unremarkable. No pancreatic ductal  dilatation or surrounding inflammatory changes. Spleen: Normal in size without focal abnormality. Adrenals/Urinary Tract: Adrenal glands are unremarkable. Kidneys are normal, without renal calculi, focal lesion, or hydronephrosis. Bladder is unremarkable. Stomach/Bowel: The stomach and appendix appear normal. No small bowel dilatation is noted. Postsurgical changes are seen involving the descending colon. The colon is dilated and fluid-filled. Moderate amount of stool is seen throughout the colon. No definite wall thickening is noted. Vascular/Lymphatic: Aortic atherosclerosis. No enlarged abdominal or pelvic lymph nodes. Reproductive: Uterus and bilateral adnexa are unremarkable. Other: No abdominal wall hernia or abnormality. No abdominopelvic ascites. Musculoskeletal: Grade 1 anterolisthesis of L5-S1 is noted secondary to bilateral L5 spondylolysis. No acute osseous abnormality is noted. IMPRESSION: 1. Minimal left pleural effusion is noted with bibasilar subsegmental atelectasis. 2. Postsurgical changes are seen involving the descending colon. The colon is dilated and fluid-filled. Moderate amount of stool is seen throughout the colon. No definite  wall thickening is noted. 3. Grade 1 anterolisthesis of L5-S1 secondary to bilateral L5 spondylolysis. Aortic Atherosclerosis (ICD10-I70.0). Electronically Signed   By: Marijo Conception M.D.   On: 01/08/2021 14:20   IR Angiogram Visceral Selective  Result Date: 01/09/2021 CLINICAL DATA:  GI bleed, localized to proximal descending colon on CTA and scintigraphy EXAM: SELECTIVE VISCERAL ARTERIOGRAPHY; IR ULTRASOUND GUIDANCE VASC ACCESS RIGHT ANESTHESIA/SEDATION: Intravenous Fentanyl 68mg and Versed or 1.556mwere administered as conscious sedation during continuous monitoring of the patient's level of consciousness and physiological / cardiorespiratory status by the radiology RN, with a total moderate sedation time of 41 minutes. MEDICATIONS: Lidocaine 1% subcutaneous  CONTRAST:  3057mMNIPAQUE IOHEXOL 300 MG/ML SOLN, 4m64mNIPAQUE IOHEXOL 300 MG/ML SOLN, 32mL52mIPAQUE IOHEXOL 300 MG/ML SOLN PROCEDURE: The procedure, risks (including but not limited to bleeding, infection, organ damage ), benefits, and alternatives were explained to the patient. Questions regarding the procedure were encouraged and answered. The patient understands and consents to the procedure. Right femoral region prepped and draped in usual sterile fashion. Maximal barrier sterile technique was utilized including caps, mask, sterile gowns, sterile gloves, sterile drape, hand hygiene and skin antiseptic. The right common femoral artery was localized under ultrasound. Under real-time ultrasound guidance, the vessel was accessed with a 21-gauge micropuncture needle, exchanged over a 018 guidewire for a transitional dilator, through which a 035 guidewire was advanced. Over this, a 5 [place]ren[place] sheath was placed, through which a 5 [place]renc[place]atheter was advanced and used to selectively catheterize the superior mesenteric artery for selective arteriography in multiple projections. A coaxial Renegade microcatheter with a fathom guidewire was advanced and used for selective arteriography of 2 third order branches in multiple projections. The catheter and sheath were removed and hemostasis achieved with the aid of the Exoseal device after confirmatory femoral arteriography. The patient tolerated the procedure well. COMPLICATIONS: None immediate FINDINGS: No evidence of active extravasation, early draining vein, AVM, or other lesion to suggest a site or etiology of the patient's GI bleeding. Venous phase confirms patency of the portal venous system. IMPRESSION: 1. Negative superior mesenteric arteriogram. No evidence of active extravasation or other focal lesion to suggest etiology of GI bleed. Electronically Signed   By: D  HaLucrezia Europe   On: 01/09/2021 09:46   IR US Gu[place]oreae Vasc Access Right  Result Date:  01/09/2021 CLINICAL DATA:  GI bleed, localized to proximal descending colon on CTA and scintigraphy EXAM: SELECTIVE VISCERAL ARTERIOGRAPHY; IR ULTRASOUND GUIDANCE VASC ACCESS RIGHT ANESTHESIA/SEDATION: Intravenous Fentanyl 75mcg71m Versed or 1.5mg we64madministered as conscious sedation during continuous monitoring of the patient's level of consciousness and physiological / cardiorespiratory status by the radiology RN, with a total moderate sedation time of 41 minutes. MEDICATIONS: Lidocaine 1% subcutaneous CONTRAST:  30mL OM43mQUE IOHEXOL 300 MG/ML SOLN, 4mL OMN71mUE IOHEXOL 300 MG/ML SOLN, 32mL OMNI10mE IOHEXOL 300 MG/ML SOLN PROCEDURE: The procedure, risks (including but not limited to bleeding, infection, organ damage ), benefits, and alternatives were explained to the patient. Questions regarding the procedure were encouraged and answered. The patient understands and consents to the procedure. Right femoral region prepped and draped in usual sterile fashion. Maximal barrier sterile technique was utilized including caps, mask, sterile gowns, sterile gloves, sterile drape, hand hygiene and skin antiseptic. The right common femoral artery was localized under ultrasound. Under real-time ultrasound guidance, the vessel was accessed with a 21-gauge micropuncture needle, exchanged over a 018 guidewire for a transitional dilator, through which a 035 guidewire was advanced. Over this,  a 5 [place] vascular sheath was placed, through which a 5 [place] C2 catheter was advanced and used to selectively catheterize the superior mesenteric artery for selective arteriography in multiple projections. A coaxial Renegade microcatheter with a fathom guidewire was advanced and used for selective arteriography of 2 third order branches in multiple projections. The catheter and sheath were removed and hemostasis achieved with the aid of the Exoseal device after confirmatory femoral arteriography. The patient tolerated the procedure  well. COMPLICATIONS: None immediate FINDINGS: No evidence of active extravasation, early draining vein, AVM, or other lesion to suggest a site or etiology of the patient's GI bleeding. Venous phase confirms patency of the portal venous system. IMPRESSION: 1. Negative superior mesenteric arteriogram. No evidence of active extravasation or other focal lesion to suggest etiology of GI bleed. Electronically Signed   By: Lucrezia Europe M.D.   On: 01/09/2021 09:46     Scheduled Meds: . sodium chloride   Intravenous Once  . budesonide  6 mg Oral Daily  . ferrous sulfate  325 mg Oral BID  . lidocaine      . pantoprazole (PROTONIX) IV  40 mg Intravenous Q12H  . peg 3350 powder  0.5 kit Oral 2 times per day  . pravastatin  10 mg Oral Daily   Continuous Infusions: . dextrose 5 % and 0.45% NaCl 75 mL/hr at 01/09/21 0623     LOS: 0 days   Time spent: Urich, MD [facility] To contact the attending provider between 7A-7P or the covering provider during after hours 7P-7A, please log into the web site [email or web address] and access using universal  password for that web site. If you do not have the password, please call the hospital operator.  01/09/2021, 10:16 AM

## 2021-01-09 NOTE — Sedation Documentation (Signed)
5Fr sheath removed from RIGHT femoral artery by Frederich Cha, RTR. Hemostasis achieved with Exoseal closure device. Groin level 0.

## 2021-01-09 NOTE — Procedures (Signed)
  Procedure: Superior mesenteric arteriogram no bleed EBL:   minimal Complications:  none immediate  See full dictation in [facility].  Dillard Cannon MD Main # [phone] Pager  [phone] Mobile [phone]

## 2021-01-09 NOTE — Progress Notes (Signed)
Patient did not have any rectal bleeding today. Patient tolerated clear liquids well. Will start prep for procedure tomorrow.

## 2021-01-09 NOTE — Progress Notes (Addendum)
Patient ID: Kathleen Pope, female   DOB: 1933-01-14, 87 y.o.   MRN: [id number]    Progress Note   Subjective   Day # 2 CC; GI bleed - BRB  Bleeding scan last p.m.- positive in the left colon, in retrospect apparently initial CT scan also showed evidence of bleeding from  A left colon diverticuli  CT angio last p.m.-no active bleed identified  Hemoglobin late last night down to 8.7, transfused 2 units-hemoglobin 11.0 this a.m.Marland Kitchen  Patient says she feels better, she says the bleeding stopped around the same time that they "put the wire in me".  She has had no further stools or bleeding.  She is hungry.     Objective   Vital signs in last 24 hours: Temp:  [97.6 F (36.4 C)-98.8 F (37.1 C)] 98.6 F (37 C) (02/25 0615) Pulse Rate:  [72-126] 72 (02/25 0615) Resp:  [12-25] 18 (02/25 0615) BP: (85-183)/(57-131) 117/67 (02/25 0615) SpO2:  [95 %-100 %] 95 % (02/25 0615) Weight:  [40.4 kg] 40.4 kg (02/24 1115) Last BM Date: 01/08/21 General:   Elderly white female in NAD Heart:  Regular rate and rhythm; no murmurs Lungs: Respirations even and unlabored, lungs CTA bilaterally Abdomen:  Soft, nontender and nondistended. Normal bowel sounds. Extremities:  Without edema. Neurologic:  Alert and oriented,  grossly normal neurologically. Psych:  Cooperative. Normal mood and affect.  Intake/Output from previous day: 02/24 0701 - 02/25 0700 In: 4268 [P.O.:240; I.V.:549.5; Blood:627.5] Out: -  Intake/Output this shift: No intake/output data recorded.  Lab Results: Recent Labs    01/08/21 1140 01/08/21 1542 01/08/21 2126  WBC 10.1  --   --   HGB 12.3 10.0* 8.7*  HCT 36.1 29.6* 26.1*  PLT 304  --   --    BMET Recent Labs    01/08/21 1140  NA 142  K 4.1  CL 104  CO2 27  GLUCOSE 154*  BUN 14  CREATININE 0.74  CALCIUM 10.1   LFT Recent Labs    01/08/21 1140  PROT 7.2  ALBUMIN 4.0  AST 23  ALT 17  ALKPHOS 57  BILITOT 0.7   PT/INR Recent Labs    01/08/21 1140   LABPROT 11.6  INR 0.9    Studies/Results: NM GI Blood Loss  Addendum Date: 01/08/2021   ADDENDUM REPORT: 01/08/2021 22:08 ADDENDUM: These results were discussed by telephone at the time of interpretation on 01/08/2021 at 10:07 pm to Dr. Havery Moros, who verbally acknowledged these results. Electronically Signed   By: Anner Crete M.D.   On: 01/08/2021 22:08   Result Date: 01/08/2021 CLINICAL DATA:  87 year old female with lower GI bleed. EXAM: NUCLEAR MEDICINE GASTROINTESTINAL BLEEDING SCAN TECHNIQUE: Sequential abdominal images were obtained following intravenous administration of Tc-21m labeled red blood cells. RADIOPHARMACEUTICALS:  19.8 mCi Tc-45m pertechnetate in-vitro labeled red cells. COMPARISON:  CT abdomen pelvis dated 01/08/2021. FINDINGS: There is physiologic uptake within the liver, kidneys, and cardiac blood pool. Physiologic activity noted in the major vasculature. There is radiotracer activity in the left lower quadrant conforming to the distal descending colon and sigmoid consistent with active GI bleed. Please note active diverticular bleed in the region of the mid descending colon is present on the earlier CT. IMPRESSION: Active GI bleed in the mid to distal descending colon corresponding to the diverticular bleed on the earlier CT. Electronically Signed: By: Anner Crete M.D. On: 01/08/2021 21:11   CT ABDOMEN PELVIS W CONTRAST  Result Date: 01/08/2021 CLINICAL DATA:  Melena. EXAM:  CT ABDOMEN AND PELVIS WITH CONTRAST TECHNIQUE: Multidetector CT imaging of the abdomen and pelvis was performed using the standard protocol following bolus administration of intravenous contrast. CONTRAST:  10mL OMNIPAQUE IOHEXOL 300 MG/ML  SOLN COMPARISON:  None. FINDINGS: Lower chest: Minimal left pleural effusion is noted. Bibasilar subsegmental atelectasis is noted. Hepatobiliary: No focal liver abnormality is seen. No gallstones, gallbladder wall thickening, or biliary dilatation. Pancreas:  Unremarkable. No pancreatic ductal dilatation or surrounding inflammatory changes. Spleen: Normal in size without focal abnormality. Adrenals/Urinary Tract: Adrenal glands are unremarkable. Kidneys are normal, without renal calculi, focal lesion, or hydronephrosis. Bladder is unremarkable. Stomach/Bowel: The stomach and appendix appear normal. No small bowel dilatation is noted. Postsurgical changes are seen involving the descending colon. The colon is dilated and fluid-filled. Moderate amount of stool is seen throughout the colon. No definite wall thickening is noted. Vascular/Lymphatic: Aortic atherosclerosis. No enlarged abdominal or pelvic lymph nodes. Reproductive: Uterus and bilateral adnexa are unremarkable. Other: No abdominal wall hernia or abnormality. No abdominopelvic ascites. Musculoskeletal: Grade 1 anterolisthesis of L5-S1 is noted secondary to bilateral L5 spondylolysis. No acute osseous abnormality is noted. IMPRESSION: 1. Minimal left pleural effusion is noted with bibasilar subsegmental atelectasis. 2. Postsurgical changes are seen involving the descending colon. The colon is dilated and fluid-filled. Moderate amount of stool is seen throughout the colon. No definite wall thickening is noted. 3. Grade 1 anterolisthesis of L5-S1 secondary to bilateral L5 spondylolysis. Aortic Atherosclerosis (ICD10-I70.0). Electronically Signed   By: Marijo Conception M.D.   On: 01/08/2021 14:20       Assessment / Plan:    #39 87 year old white female with history of recurrent GI bleeding who presents with acute onset of large-volume hematochezia, painless.  Drop in hemoglobin from 12.3 on arrival to 8.7 last night.  She has remained hemodynamically stable, received 2 units of packed RBCs and hemoglobin 11 this morning.  Tagged RBC scan positive for bleed in the left colon, subsequent CT angio negative for any evidence of active bleed. Apparently CT earlier in the day yesterday on review did show evidence  of some extravasation in the left colon possibly secondary to a single diverticulum.  Unclear if this acute bleed is secondary to a diverticular hemorrhage (no diverticuli noted on most recent colonoscopies), versus left colon AVM, versus potential anastomotic ulceration.   Plan; clear liquid diet today, n.p.o. after midnight Continue serial hemoglobins and transfuse hemoglobin to keep above 8 due to advanced age.  Bowel prep later today and have rescheduled colonoscopy for tomorrow a.m. with Dr. Henrene Pastor.     LOS: 0 days   Amy Esterwood PA-C 01/09/2021, 8:43 AM   GI ATTENDING  Interval history data reviewed.  Patient personally seen and examined.  Agree with interval progress note as outlined above.  No further bleeding.  Investigations from yesterday's radiologic examinations favor sigmoid diverticular bleeding.  She has had resection in that area.  Need to rule out anastomotic ulceration.  As well, she has had a history of significantly bleeding AVMs.  Need to rule out recurrent AVMs.  She is for colonoscopy tomorrow.  Patient is high risk given her age and comorbidities.  She will require monitored anesthesia care.The nature of the procedure, as well as the risks, benefits, and alternatives were carefully and thoroughly reviewed with the patient. Ample time for discussion and questions allowed. The patient understood, was satisfied, and agreed to proceed.  Docia Chuck. Geri Seminole., M.D. [facility] Division of Gastroenterology

## 2021-01-09 NOTE — Sedation Documentation (Signed)
Gauze/tegaderm bandage applied to RIGHT femoral puncture site. Groin level 0. 1+RDP, 1+RPT.

## 2021-01-10 ENCOUNTER — Inpatient Hospital Stay (HOSPITAL_COMMUNITY): Payer: Medicare Other | Admitting: Certified Registered"

## 2021-01-10 ENCOUNTER — Encounter (HOSPITAL_COMMUNITY)
Admission: EM | Disposition: A | Discharge status: Home / Self Care | Payer: Self-pay | Source: Home / Self Care | Attending: Family Medicine

## 2021-01-10 ENCOUNTER — Encounter (HOSPITAL_COMMUNITY): Payer: Self-pay | Admitting: Family Medicine

## 2021-01-10 DIAGNOSIS — D122 Benign neoplasm of ascending colon: Secondary | ICD-10-CM

## 2021-01-10 DIAGNOSIS — K635 Polyp of colon: Secondary | ICD-10-CM

## 2021-01-10 DIAGNOSIS — K625 Hemorrhage of anus and rectum: Secondary | ICD-10-CM | POA: Diagnosis not present

## 2021-01-10 DIAGNOSIS — D120 Benign neoplasm of cecum: Secondary | ICD-10-CM

## 2021-01-10 HISTORY — PX: HOT HEMOSTASIS: SHX5433

## 2021-01-10 HISTORY — PX: COLONOSCOPY WITH PROPOFOL: SHX5780

## 2021-01-10 HISTORY — PX: POLYPECTOMY: SHX5525

## 2021-01-10 LAB — CBC
HCT: 30.1 % — ABNORMAL LOW (ref 36.0–46.0)
Hemoglobin: 10.2 g/dL — ABNORMAL LOW (ref 12.0–15.0)
MCH: 31.8 pg (ref 26.0–34.0)
MCHC: 33.9 g/dL (ref 30.0–36.0)
MCV: 93.8 fL (ref 80.0–100.0)
Platelets: 189 10*3/uL (ref 150–400)
RBC: 3.21 MIL/uL — ABNORMAL LOW (ref 3.87–5.11)
RDW: 15.6 % — ABNORMAL HIGH (ref 11.5–15.5)
WBC: 7.1 10*3/uL (ref 4.0–10.5)
nRBC: 0 % (ref 0.0–0.2)

## 2021-01-10 LAB — BASIC METABOLIC PANEL
Anion gap: 6 (ref 5–15)
BUN: 9 mg/dL (ref 8–23)
CO2: 25 mmol/L (ref 22–32)
Calcium: 8.2 mg/dL — ABNORMAL LOW (ref 8.9–10.3)
Chloride: 111 mmol/L (ref 98–111)
Creatinine, Ser: 0.72 mg/dL (ref 0.44–1.00)
GFR, Estimated: >60 mL/min (ref >60)
Glucose, Bld: 117 mg/dL — ABNORMAL HIGH (ref 70–99)
Potassium: 3.6 mmol/L (ref 3.5–5.1)
Sodium: 142 mmol/L (ref 135–145)

## 2021-01-10 LAB — HEMOGLOBIN AND HEMATOCRIT, BLOOD
HCT: 29.8 % — ABNORMAL LOW (ref 36.0–46.0)
HCT: 32.2 % — ABNORMAL LOW (ref 36.0–46.0)
Hemoglobin: 10.1 g/dL — ABNORMAL LOW (ref 12.0–15.0)
Hemoglobin: 10.9 g/dL — ABNORMAL LOW (ref 12.0–15.0)

## 2021-01-10 SURGERY — COLONOSCOPY WITH PROPOFOL
Anesthesia: Monitor Anesthesia Care

## 2021-01-10 MED ORDER — PROPOFOL 500 MG/50ML IV EMUL
INTRAVENOUS | Status: DC | PRN
  Administered 2021-01-10: 75 ug/kg/min via INTRAVENOUS

## 2021-01-10 MED ORDER — ASPIRIN EC 81 MG PO TBEC: 81.0000 mg | DELAYED_RELEASE_TABLET | ORAL | 11 refills | Status: DC

## 2021-01-10 MED ORDER — PROPOFOL 500 MG/50ML IV EMUL
INTRAVENOUS | Status: AC
  Filled 2021-01-10: qty 50

## 2021-01-10 MED ORDER — ONDANSETRON HCL 4 MG/2ML IJ SOLN
INTRAMUSCULAR | Status: DC | PRN
  Administered 2021-01-10: 4 mg via INTRAVENOUS

## 2021-01-10 MED ORDER — LABETALOL HCL 5 MG/ML IV SOLN
INTRAVENOUS | Status: DC | PRN
  Administered 2021-01-10: 5 mg via INTRAVENOUS

## 2021-01-10 MED ORDER — LIDOCAINE 2% (20 MG/ML) 5 ML SYRINGE
INTRAMUSCULAR | Status: DC | PRN
  Administered 2021-01-10: 20 mg via INTRAVENOUS

## 2021-01-10 MED ORDER — PANTOPRAZOLE SODIUM 40 MG PO TBEC: 40.0000 mg | DELAYED_RELEASE_TABLET | Freq: Two times a day (BID) | ORAL | 3 refills | Status: DC

## 2021-01-10 MED ORDER — PROPOFOL 10 MG/ML IV BOLUS
INTRAVENOUS | Status: DC | PRN
  Administered 2021-01-10 (×8): 10 mg via INTRAVENOUS

## 2021-01-10 MED ORDER — PROPOFOL 10 MG/ML IV BOLUS
INTRAVENOUS | Status: AC
  Filled 2021-01-10: qty 20

## 2021-01-10 MED ORDER — LACTATED RINGERS IV SOLN: INTRAVENOUS | Status: DC | PRN

## 2021-01-10 SURGICAL SUPPLY — 22 items

## 2021-01-10 NOTE — Anesthesia Postprocedure Evaluation (Signed)
Anesthesia Post Note  Patient: JALEIAH ASAY  Procedure(s) Performed: COLONOSCOPY WITH PROPOFOL (N/A ) POLYPECTOMY HOT HEMOSTASIS (ARGON PLASMA COAGULATION/BICAP) (N/A )     Patient location during evaluation: PACU Anesthesia Type: MAC Level of consciousness: awake and alert Pain management: pain level controlled Vital Signs Assessment: post-procedure vital signs reviewed and stable Respiratory status: spontaneous breathing, nonlabored ventilation, respiratory function stable and patient connected to nasal cannula oxygen Cardiovascular status: stable and blood pressure returned to baseline Postop Assessment: no apparent nausea or vomiting Anesthetic complications: no   No complications documented.  Last Vitals:  Vitals:   01/10/21 1230 01/10/21 1256  BP: (!) 165/91 (!) 163/84  Pulse: 74 69  Resp: 19 18  Temp: 36.9 C 36.6 C  SpO2: 100% 100%    Last Pain:  Vitals:   01/10/21 1300  TempSrc:   PainSc: 0-No pain                 Tiajuana Amass

## 2021-01-10 NOTE — Transfer of Care (Signed)
Immediate Anesthesia Transfer of Care Note  Patient: Kathleen Pope  Procedure(s) Performed: COLONOSCOPY WITH PROPOFOL (N/A ) POLYPECTOMY HOT HEMOSTASIS (ARGON PLASMA COAGULATION/BICAP) (N/A )  Patient Location: PACU  Anesthesia Type:MAC  Level of Consciousness: drowsy and patient cooperative  Airway & Oxygen Therapy: Patient Spontanous Breathing and Patient connected to face mask oxygen  Post-op Assessment: Report given to RN and Post -op Vital signs reviewed and stable  Post vital signs: Reviewed and stable  Last Vitals:  Vitals Value Taken Time  BP 128/72 01/10/21 1211  Temp 36.7 C 01/10/21 1211  Pulse 67 01/10/21 1214  Resp 16 01/10/21 1214  SpO2 100 % 01/10/21 1214  Vitals shown include unvalidated device data.  Last Pain:  Vitals:   01/10/21 1211  TempSrc:   PainSc: 0-No pain         Complications: No complications documented.

## 2021-01-10 NOTE — Progress Notes (Signed)
Patient currently eating lunch and will be discharging with daughter. Belongings were returned. Education on medications will be provided.

## 2021-01-10 NOTE — Anesthesia Preprocedure Evaluation (Addendum)
Anesthesia Evaluation  Patient identified by MRN, date of birth, ID band Patient awake    Reviewed: Allergy & Precautions, NPO status , Patient's Chart, lab work & pertinent test results  Airway Mallampati: II  TM Distance: >3 FB     Dental  (+) Dental Advisory Given   Pulmonary COPD, Current Smoker and Patient abstained from smoking.,    breath sounds clear to auscultation       Cardiovascular hypertension,  Rhythm:Regular Rate:Normal     Neuro/Psych negative neurological ROS     GI/Hepatic negative GI ROS, Neg liver ROS,   Endo/Other  negative endocrine ROS  Renal/GU negative Renal ROS     Musculoskeletal   Abdominal   Peds  Hematology  (+) anemia ,   Anesthesia Other Findings   Reproductive/Obstetrics                            Anesthesia Physical Anesthesia Plan  ASA: III  Anesthesia Plan: MAC   Post-op Pain Management:    Induction:   PONV Risk Score and Plan: 1 and Propofol infusion, Ondansetron and Treatment may vary due to age or medical condition  Airway Management Planned: Natural Airway and Simple Face Mask  Additional Equipment:   Intra-op Plan:   Post-operative Plan:   Informed Consent: I have reviewed the patients History and Physical, chart, labs and discussed the procedure including the risks, benefits and alternatives for the proposed anesthesia with the patient or authorized representative who has indicated his/her understanding and acceptance.   Patient has DNR.  Discussed DNR with patient and Suspend DNR.     Plan Discussed with: CRNA  Anesthesia Plan Comments:        Anesthesia Quick Evaluation

## 2021-01-10 NOTE — Progress Notes (Addendum)
Patient ID: Kathleen Pope, female   DOB: June 19, 1933, 87 y.o.   MRN: [id number]    Progress Note   Subjective   Day # 3  CC; GI bleed HGB 10.2.>11.6>10.2  Tolerated prep without difficulty, no obvious bleeding during prep  Patient says she feels okay, is hoping to go home later today.    Objective   Vital signs in last 24 hours: Temp:  [98.1 F (36.7 C)-99.1 F (37.3 C)] 98.6 F (37 C) (02/26 0414) Pulse Rate:  [75-86] 75 (02/26 0414) Resp:  [14-18] 18 (02/26 0414) BP: (115-141)/(66-87) 125/80 (02/26 0414) SpO2:  [96 %-99 %] 96 % (02/26 0414) Last BM Date: 01/10/21 General:   Elderly  white female in NAD Heart:  Regular rate and rhythm; no murmurs Lungs: Respirations even and unlabored, lungs CTA bilaterally Abdomen:  Soft, nontender and nondistended. Normal bowel sounds.  Angio site bandaged right groin Extremities:  Without edema. Neurologic:  Alert and oriented,  grossly normal neurologically. Psych:  Cooperative. Normal mood and affect.  Intake/Output from previous day: 02/25 0701 - 02/26 0700 In: -  Out: 500 [Urine:500] Intake/Output this shift: No intake/output data recorded.  Lab Results: Recent Labs    01/08/21 1140 01/08/21 1542 01/09/21 0815 01/09/21 1437 01/09/21 2149 01/10/21 0305  WBC 10.1  --  8.3  --   --  7.1  HGB 12.3   < > 11.0*  11.1* 10.2* 11.6* 10.1*  10.2*  HCT 36.1   < > 32.9*  32.9* 29.2* 33.2* 29.8*  30.1*  PLT 304  --  198  --   --  189   < > = values in this interval not displayed.   BMET Recent Labs    01/08/21 1140 01/09/21 0815 01/10/21 0305  NA 142 140 142  K 4.1 5.1 3.6  CL 104 105 111  CO2 27 27 25   GLUCOSE 154* 119* 117*  BUN 14 16 9   CREATININE 0.74 0.81 0.72  CALCIUM 10.1 8.3* 8.2*   LFT Recent Labs    01/08/21 1140  PROT 7.2  ALBUMIN 4.0  AST 23  ALT 17  ALKPHOS 57  BILITOT 0.7   PT/INR Recent Labs    01/08/21 1140  LABPROT 11.6  INR 0.9    Studies/Results: NM GI Blood Loss  Addendum  Date: 01/08/2021   ADDENDUM REPORT: 01/08/2021 22:08 ADDENDUM: These results were discussed by telephone at the time of interpretation on 01/08/2021 at 10:07 pm to Dr. Havery Moros, who verbally acknowledged these results. Electronically Signed   By: Anner Crete M.D.   On: 01/08/2021 22:08   Result Date: 01/08/2021 CLINICAL DATA:  87 year old female with lower GI bleed. EXAM: NUCLEAR MEDICINE GASTROINTESTINAL BLEEDING SCAN TECHNIQUE: Sequential abdominal images were obtained following intravenous administration of Tc-60m labeled red blood cells. RADIOPHARMACEUTICALS:  19.8 mCi Tc-70m pertechnetate in-vitro labeled red cells. COMPARISON:  CT abdomen pelvis dated 01/08/2021. FINDINGS: There is physiologic uptake within the liver, kidneys, and cardiac blood pool. Physiologic activity noted in the major vasculature. There is radiotracer activity in the left lower quadrant conforming to the distal descending colon and sigmoid consistent with active GI bleed. Please note active diverticular bleed in the region of the mid descending colon is present on the earlier CT. IMPRESSION: Active GI bleed in the mid to distal descending colon corresponding to the diverticular bleed on the earlier CT. Electronically Signed: By: Anner Crete M.D. On: 01/08/2021 21:11   CT ABDOMEN PELVIS W CONTRAST  Result Date: 01/08/2021 CLINICAL DATA:  Melena. EXAM: CT ABDOMEN AND PELVIS WITH CONTRAST TECHNIQUE: Multidetector CT imaging of the abdomen and pelvis was performed using the standard protocol following bolus administration of intravenous contrast. CONTRAST:  61mL OMNIPAQUE IOHEXOL 300 MG/ML  SOLN COMPARISON:  None. FINDINGS: Lower chest: Minimal left pleural effusion is noted. Bibasilar subsegmental atelectasis is noted. Hepatobiliary: No focal liver abnormality is seen. No gallstones, gallbladder wall thickening, or biliary dilatation. Pancreas: Unremarkable. No pancreatic ductal dilatation or surrounding inflammatory  changes. Spleen: Normal in size without focal abnormality. Adrenals/Urinary Tract: Adrenal glands are unremarkable. Kidneys are normal, without renal calculi, focal lesion, or hydronephrosis. Bladder is unremarkable. Stomach/Bowel: The stomach and appendix appear normal. No small bowel dilatation is noted. Postsurgical changes are seen involving the descending colon. The colon is dilated and fluid-filled. Moderate amount of stool is seen throughout the colon. No definite wall thickening is noted. Vascular/Lymphatic: Aortic atherosclerosis. No enlarged abdominal or pelvic lymph nodes. Reproductive: Uterus and bilateral adnexa are unremarkable. Other: No abdominal wall hernia or abnormality. No abdominopelvic ascites. Musculoskeletal: Grade 1 anterolisthesis of L5-S1 is noted secondary to bilateral L5 spondylolysis. No acute osseous abnormality is noted. IMPRESSION: 1. Minimal left pleural effusion is noted with bibasilar subsegmental atelectasis. 2. Postsurgical changes are seen involving the descending colon. The colon is dilated and fluid-filled. Moderate amount of stool is seen throughout the colon. No definite wall thickening is noted. 3. Grade 1 anterolisthesis of L5-S1 secondary to bilateral L5 spondylolysis. Aortic Atherosclerosis (ICD10-I70.0). Electronically Signed   By: Marijo Conception M.D.   On: 01/08/2021 14:20   IR Angiogram Visceral Selective  Result Date: 01/09/2021 CLINICAL DATA:  GI bleed, localized to proximal descending colon on CTA and scintigraphy EXAM: SELECTIVE VISCERAL ARTERIOGRAPHY; IR ULTRASOUND GUIDANCE VASC ACCESS RIGHT ANESTHESIA/SEDATION: Intravenous Fentanyl 40mcg and Versed or 1.5mg  were administered as conscious sedation during continuous monitoring of the patient's level of consciousness and physiological / cardiorespiratory status by the radiology RN, with a total moderate sedation time of 41 minutes. MEDICATIONS: Lidocaine 1% subcutaneous CONTRAST:  67mL OMNIPAQUE IOHEXOL 300  MG/ML SOLN, 35mL OMNIPAQUE IOHEXOL 300 MG/ML SOLN, 11mL OMNIPAQUE IOHEXOL 300 MG/ML SOLN PROCEDURE: The procedure, risks (including but not limited to bleeding, infection, organ damage ), benefits, and alternatives were explained to the patient. Questions regarding the procedure were encouraged and answered. The patient understands and consents to the procedure. Right femoral region prepped and draped in usual sterile fashion. Maximal barrier sterile technique was utilized including caps, mask, sterile gowns, sterile gloves, sterile drape, hand hygiene and skin antiseptic. The right common femoral artery was localized under ultrasound. Under real-time ultrasound guidance, the vessel was accessed with a 21-gauge micropuncture needle, exchanged over a 018 guidewire for a transitional dilator, through which a 035 guidewire was advanced. Over this, a 5 [place] vascular sheath was placed, through which a 5 [place] C2 catheter was advanced and used to selectively catheterize the superior mesenteric artery for selective arteriography in multiple projections. A coaxial Renegade microcatheter with a fathom guidewire was advanced and used for selective arteriography of 2 third order branches in multiple projections. The catheter and sheath were removed and hemostasis achieved with the aid of the Exoseal device after confirmatory femoral arteriography. The patient tolerated the procedure well. COMPLICATIONS: None immediate FINDINGS: No evidence of active extravasation, early draining vein, AVM, or other lesion to suggest a site or etiology of the patient's GI bleeding. Venous phase confirms patency of the portal venous system. IMPRESSION: 1. Negative superior mesenteric arteriogram. No evidence of active extravasation  or other focal lesion to suggest etiology of GI bleed. Electronically Signed   By: Lucrezia Europe M.D.   On: 01/09/2021 09:46   IR US Guide Vasc Access Right  Result Date: 01/09/2021 CLINICAL DATA:  GI bleed,  localized to proximal descending colon on CTA and scintigraphy EXAM: SELECTIVE VISCERAL ARTERIOGRAPHY; IR ULTRASOUND GUIDANCE VASC ACCESS RIGHT ANESTHESIA/SEDATION: Intravenous Fentanyl 48mcg and Versed or 1.5mg  were administered as conscious sedation during continuous monitoring of the patient's level of consciousness and physiological / cardiorespiratory status by the radiology RN, with a total moderate sedation time of 41 minutes. MEDICATIONS: Lidocaine 1% subcutaneous CONTRAST:  48mL OMNIPAQUE IOHEXOL 300 MG/ML SOLN, 34mL OMNIPAQUE IOHEXOL 300 MG/ML SOLN, 41mL OMNIPAQUE IOHEXOL 300 MG/ML SOLN PROCEDURE: The procedure, risks (including but not limited to bleeding, infection, organ damage ), benefits, and alternatives were explained to the patient. Questions regarding the procedure were encouraged and answered. The patient understands and consents to the procedure. Right femoral region prepped and draped in usual sterile fashion. Maximal barrier sterile technique was utilized including caps, mask, sterile gowns, sterile gloves, sterile drape, hand hygiene and skin antiseptic. The right common femoral artery was localized under ultrasound. Under real-time ultrasound guidance, the vessel was accessed with a 21-gauge micropuncture needle, exchanged over a 018 guidewire for a transitional dilator, through which a 035 guidewire was advanced. Over this, a 5 [place] vascular sheath was placed, through which a 5 [place] C2 catheter was advanced and used to selectively catheterize the superior mesenteric artery for selective arteriography in multiple projections. A coaxial Renegade microcatheter with a fathom guidewire was advanced and used for selective arteriography of 2 third order branches in multiple projections. The catheter and sheath were removed and hemostasis achieved with the aid of the Exoseal device after confirmatory femoral arteriography. The patient tolerated the procedure well. COMPLICATIONS: None immediate  FINDINGS: No evidence of active extravasation, early draining vein, AVM, or other lesion to suggest a site or etiology of the patient's GI bleeding. Venous phase confirms patency of the portal venous system. IMPRESSION: 1. Negative superior mesenteric arteriogram. No evidence of active extravasation or other focal lesion to suggest etiology of GI bleed. Electronically Signed   By: Lucrezia Europe M.D.   On: 01/09/2021 09:46       Assessment / Plan:    #14 87 year old white female with acute significant lower GI bleed. Bleeding has stopped fortunately, and hemoglobin stable at 10 post transfusions.  Etiology of bleeding felt secondary to possible diverticulum, AVM versus anastomotic ulceration. Bleeding scan + 01/09/2021 left colon, subsequent angio negative and no further active bleeding since.  Patient is scheduled for colonoscopy today with Dr. Henrene Pastor. Further recommendations pending findings. Possible discharge later today       Active Problems:   Mixed hyperlipidemia   Cigarette smoker   Murmur   AVM (arteriovenous malformation) of colon   HTN (hypertension)   GI bleed   Abnormal CT of the abdomen     LOS: 1 day   Amy Esterwood PA-C 01/10/2021, 8:26 AM   GI ATTENDING  Interval history data reviewed.  Patient personally seen and examined in the endoscopy suite preprocedure.  No further bleeding.  Completed her prep without difficulty.  Bleeding from left colon on imaging.  Differential diagnosis includes diverticular bleeding, AVM (she has a history of same), and anastomotic ulceration (prior sigmoid resection).The nature of the procedure, as well as the risks, benefits, and alternatives were carefully and thoroughly reviewed with the patient. Ample time for discussion  and questions allowed. The patient understood, was satisfied, and agreed to proceed.  Docia Chuck. Geri Seminole., M.D. [facility] Division of Gastroenterology

## 2021-01-10 NOTE — Discharge Summary (Signed)
Physician Discharge Summary  Kathleen Pope TSV:[id number] DOB: Dec 18, 1932 DOA: 01/08/2021  PCP: Irene Shipper, MD  Admit date: 01/08/2021 Discharge date: 01/10/2021  Time spent: 40 minutes  Recommendations for Outpatient Follow-up:  1. Resume aspirin in 1 week-indication carotid artery stenosis 2. Outpatient CBC Chem-12 etc.  Discharge Diagnoses:  MAIN problem for hospitalization   Lower GI bleed acute Acute blood loss anemia  Please see below for itemized issues addressed in [facility]- refer to other progress notes for clarity if needed  Discharge Condition: Improved  Diet recommendation:   Regular  Filed Weights   01/08/21 1115  Weight: 40.4 kg    History of present illness:  87 y/o wf community dwelling Mild A OS, COPD presumed, HTN, HLD Recurrent acute GI bleeds painless hematochezia despite sigmoid resection 2001+ known cecal AVMs followed by [facility]  Last colonoscopy 2019 collagenous colitis-on budesonide chronically-does not take NSAIDs  Found to be tachycardic afebrile-2/2 listed history GI recommended stat nuclear bleeding scan Bleeding scan +, however on angiogram no target for IR embolization 2/24 GI determining next steps  Hospital Course:   Hematochezia 2/2 cecal AVM status post [-] SMA angiogram 2/24 Prior collagenous colitis             Bleeding scan done on admission was positive Mesenteric CTA however no confirmation Colonoscopy 2/26 revealed multiple small polyps Patient discharged on Protonix twice daily, aspirin held for 1 week as per instructions from gastroenterology Carotid artery stenosis Cautious reimplementation of aspirin as above  anemia of acute blood loss from GI bleed             Transfusion threshold less than 7            Hemoglobin stabilized during hospital stay Mild AOS per echo 2013             Has appointment for outpatient echo in the next 2 to 3 weeks             Holding Lasix on discharge  Resumed losartan 50 mg daily           COPD presumed-former smoker             Asymptomatic HTN             Holding meds as above HLD  Procedures:  CT angiogram  Endoscopy  Consultations: Impression:                - Four 2 to 6 mm polyps in the ascending colon and                             in the cecum, removed with a cold snare. Resected                            and retrieved.                            - A single non-bleeding colonic angiodysplastic                            lesion. Treated with argon beam coagulation.                            - Diverticulosis in the sigmoid colon region.  Status post sigmoid colectomy with unremarkable                            anastomosis.                            - The examination was otherwise normal on direct                            and retroflexion views.  Discharge Exam: Vitals:   01/10/21 1230 01/10/21 1256  BP: (!) 165/91 (!) 163/84  Pulse: 74 69  Resp: 19 18  Temp: 98.4 F (36.9 C) 97.9 F (36.6 C)  SpO2: 100% 100%    Subj on day of d/c   I want to go home"  General Exam on discharge  Pleasant coherent no distress NCAT no focal deficit chest clear no added sound no rales no rhonchi abdomen soft no rebound extremity edema Abdomen soft   Discharge Instructions   Discharge Instructions    Diet - low sodium heart healthy   Complete by: As directed    Discharge instructions   Complete by: As directed    Stop Aspirin until 3/5/22as per Dr. Henrene Pastor, GI Continue protonix 40 bid for 8 weeks and discuss continued use with Gastroenterology Need Repeat labs in 1 week at Primary MD office   Pls seek care if recurrent dark stools   Increase activity slowly   Complete by: As directed      Allergies as of 01/10/2021   No Known Allergies     Medication List    STOP taking these medications   acetaminophen 500 MG tablet Commonly known as: TYLENOL   aspirin EC 81 MG tablet   fish oil-omega-3 fatty acids  1000 MG capsule   losartan 50 MG tablet Commonly known as: COZAAR   multivitamin tablet     TAKE these medications   budesonide 3 MG 24 hr capsule Commonly known as: ENTOCORT EC Take 9mg  for one month; then 6 mg for one month; then 3 mg for one month; then stop What changed:   how much to take  how to take this  when to take this   calcium carbonate 600 MG Tabs tablet Commonly known as: OS-CAL Take 600 mg by mouth daily.   ECHINACEA PO Take 1 capsule by mouth daily.   ferrous sulfate 325 (65 FE) MG EC tablet Take 325 mg by mouth 2 (two) times daily.   furosemide 20 MG tablet Commonly known as: LASIX Take 1 tablet (20 mg total) by mouth daily for 3 days.   pantoprazole 40 MG tablet Commonly known as: Protonix Take 1 tablet (40 mg total) by mouth 2 (two) times daily.   pravastatin 10 MG tablet Commonly known as: PRAVACHOL Take 10 mg by mouth daily.      No Known Allergies    The results of significant diagnostics from this hospitalization (including imaging, microbiology, ancillary and laboratory) are listed below for reference.    Significant Diagnostic Studies: NM GI Blood Loss  Addendum Date: 01/08/2021   ADDENDUM REPORT: 01/08/2021 22:08 ADDENDUM: These results were discussed by telephone at the time of interpretation on 01/08/2021 at 10:07 pm to Dr. Havery Moros, who verbally acknowledged these results. Electronically Signed   By: Anner Crete M.D.   On: 01/08/2021 22:08   Result Date: 01/08/2021 CLINICAL DATA:  87 year old female with lower GI bleed. EXAM: NUCLEAR MEDICINE GASTROINTESTINAL BLEEDING SCAN TECHNIQUE: Sequential abdominal images were obtained following intravenous administration of Tc-94m labeled red blood cells. RADIOPHARMACEUTICALS:  19.8 mCi Tc-17m pertechnetate in-vitro labeled red cells. COMPARISON:  CT abdomen pelvis dated 01/08/2021. FINDINGS: There is physiologic uptake within the liver, kidneys, and cardiac blood pool. Physiologic  activity noted in the major vasculature. There is radiotracer activity in the left lower quadrant conforming to the distal descending colon and sigmoid consistent with active GI bleed. Please note active diverticular bleed in the region of the mid descending colon is present on the earlier CT. IMPRESSION: Active GI bleed in the mid to distal descending colon corresponding to the diverticular bleed on the earlier CT. Electronically Signed: By: Anner Crete M.D. On: 01/08/2021 21:11   CT ABDOMEN PELVIS W CONTRAST  Result Date: 01/08/2021 CLINICAL DATA:  Melena. EXAM: CT ABDOMEN AND PELVIS WITH CONTRAST TECHNIQUE: Multidetector CT imaging of the abdomen and pelvis was performed using the standard protocol following bolus administration of intravenous contrast. CONTRAST:  30mL OMNIPAQUE IOHEXOL 300 MG/ML  SOLN COMPARISON:  None. FINDINGS: Lower chest: Minimal left pleural effusion is noted. Bibasilar subsegmental atelectasis is noted. Hepatobiliary: No focal liver abnormality is seen. No gallstones, gallbladder wall thickening, or biliary dilatation. Pancreas: Unremarkable. No pancreatic ductal dilatation or surrounding inflammatory changes. Spleen: Normal in size without focal abnormality. Adrenals/Urinary Tract: Adrenal glands are unremarkable. Kidneys are normal, without renal calculi, focal lesion, or hydronephrosis. Bladder is unremarkable. Stomach/Bowel: The stomach and appendix appear normal. No small bowel dilatation is noted. Postsurgical changes are seen involving the descending colon. The colon is dilated and fluid-filled. Moderate amount of stool is seen throughout the colon. No definite wall thickening is noted. Vascular/Lymphatic: Aortic atherosclerosis. No enlarged abdominal or pelvic lymph nodes. Reproductive: Uterus and bilateral adnexa are unremarkable. Other: No abdominal wall hernia or abnormality. No abdominopelvic ascites. Musculoskeletal: Grade 1 anterolisthesis of L5-S1 is noted secondary  to bilateral L5 spondylolysis. No acute osseous abnormality is noted. IMPRESSION: 1. Minimal left pleural effusion is noted with bibasilar subsegmental atelectasis. 2. Postsurgical changes are seen involving the descending colon. The colon is dilated and fluid-filled. Moderate amount of stool is seen throughout the colon. No definite wall thickening is noted. 3. Grade 1 anterolisthesis of L5-S1 secondary to bilateral L5 spondylolysis. Aortic Atherosclerosis (ICD10-I70.0). Electronically Signed   By: Marijo Conception M.D.   On: 01/08/2021 14:20   IR Angiogram Visceral Selective  Result Date: 01/09/2021 CLINICAL DATA:  GI bleed, localized to proximal descending colon on CTA and scintigraphy EXAM: SELECTIVE VISCERAL ARTERIOGRAPHY; IR ULTRASOUND GUIDANCE VASC ACCESS RIGHT ANESTHESIA/SEDATION: Intravenous Fentanyl 2mcg and Versed or 1.5mg  were administered as conscious sedation during continuous monitoring of the patient's level of consciousness and physiological / cardiorespiratory status by the radiology RN, with a total moderate sedation time of 41 minutes. MEDICATIONS: Lidocaine 1% subcutaneous CONTRAST:  26mL OMNIPAQUE IOHEXOL 300 MG/ML SOLN, 17mL OMNIPAQUE IOHEXOL 300 MG/ML SOLN, 80mL OMNIPAQUE IOHEXOL 300 MG/ML SOLN PROCEDURE: The procedure, risks (including but not limited to bleeding, infection, organ damage ), benefits, and alternatives were explained to the patient. Questions regarding the procedure were encouraged and answered. The patient understands and consents to the procedure. Right femoral region prepped and draped in usual sterile fashion. Maximal barrier sterile technique was utilized including caps, mask, sterile gowns, sterile gloves, sterile drape, hand hygiene and skin antiseptic. The right common femoral artery was localized under ultrasound. Under real-time ultrasound guidance, the vessel was accessed with  a 21-gauge micropuncture needle, exchanged over a 018 guidewire for a transitional  dilator, through which a 035 guidewire was advanced. Over this, a 5 [place] vascular sheath was placed, through which a 5 [place] C2 catheter was advanced and used to selectively catheterize the superior mesenteric artery for selective arteriography in multiple projections. A coaxial Renegade microcatheter with a fathom guidewire was advanced and used for selective arteriography of 2 third order branches in multiple projections. The catheter and sheath were removed and hemostasis achieved with the aid of the Exoseal device after confirmatory femoral arteriography. The patient tolerated the procedure well. COMPLICATIONS: None immediate FINDINGS: No evidence of active extravasation, early draining vein, AVM, or other lesion to suggest a site or etiology of the patient's GI bleeding. Venous phase confirms patency of the portal venous system. IMPRESSION: 1. Negative superior mesenteric arteriogram. No evidence of active extravasation or other focal lesion to suggest etiology of GI bleed. Electronically Signed   By: Lucrezia Europe M.D.   On: 01/09/2021 09:46   IR US Guide Vasc Access Right  Result Date: 01/09/2021 CLINICAL DATA:  GI bleed, localized to proximal descending colon on CTA and scintigraphy EXAM: SELECTIVE VISCERAL ARTERIOGRAPHY; IR ULTRASOUND GUIDANCE VASC ACCESS RIGHT ANESTHESIA/SEDATION: Intravenous Fentanyl 72mcg and Versed or 1.5mg  were administered as conscious sedation during continuous monitoring of the patient's level of consciousness and physiological / cardiorespiratory status by the radiology RN, with a total moderate sedation time of 41 minutes. MEDICATIONS: Lidocaine 1% subcutaneous CONTRAST:  63mL OMNIPAQUE IOHEXOL 300 MG/ML SOLN, 68mL OMNIPAQUE IOHEXOL 300 MG/ML SOLN, 43mL OMNIPAQUE IOHEXOL 300 MG/ML SOLN PROCEDURE: The procedure, risks (including but not limited to bleeding, infection, organ damage ), benefits, and alternatives were explained to the patient. Questions regarding the procedure  were encouraged and answered. The patient understands and consents to the procedure. Right femoral region prepped and draped in usual sterile fashion. Maximal barrier sterile technique was utilized including caps, mask, sterile gowns, sterile gloves, sterile drape, hand hygiene and skin antiseptic. The right common femoral artery was localized under ultrasound. Under real-time ultrasound guidance, the vessel was accessed with a 21-gauge micropuncture needle, exchanged over a 018 guidewire for a transitional dilator, through which a 035 guidewire was advanced. Over this, a 5 [place] vascular sheath was placed, through which a 5 [place] C2 catheter was advanced and used to selectively catheterize the superior mesenteric artery for selective arteriography in multiple projections. A coaxial Renegade microcatheter with a fathom guidewire was advanced and used for selective arteriography of 2 third order branches in multiple projections. The catheter and sheath were removed and hemostasis achieved with the aid of the Exoseal device after confirmatory femoral arteriography. The patient tolerated the procedure well. COMPLICATIONS: None immediate FINDINGS: No evidence of active extravasation, early draining vein, AVM, or other lesion to suggest a site or etiology of the patient's GI bleeding. Venous phase confirms patency of the portal venous system. IMPRESSION: 1. Negative superior mesenteric arteriogram. No evidence of active extravasation or other focal lesion to suggest etiology of GI bleed. Electronically Signed   By: Lucrezia Europe M.D.   On: 01/09/2021 09:46    Microbiology: Recent Results (from the past 240 hour(s))  Resp Panel by RT-PCR (Flu A&B, Covid) Nasopharyngeal Swab     Status: None   Collection Time: 01/08/21  2:51 PM   Specimen: Nasopharyngeal Swab; Nasopharyngeal(NP) swabs in vial transport medium  Result Value Ref Range Status   SARS Coronavirus 2 by RT PCR NEGATIVE NEGATIVE Final  Comment:  (NOTE) SARS-CoV-2 target nucleic acids are NOT DETECTED.  The SARS-CoV-2 RNA is generally detectable in upper respiratory specimens during the acute phase of infection. The lowest concentration of SARS-CoV-2 viral copies this assay can detect is 138 copies/mL. A negative result does not preclude SARS-Cov-2 infection and should not be used as the sole basis for treatment or other patient management decisions. A negative result may occur with  improper specimen collection/handling, submission of specimen other than nasopharyngeal swab, presence of viral mutation(s) within the areas targeted by this assay, and inadequate number of viral copies(<138 copies/mL). A negative result must be combined with clinical observations, patient history, and epidemiological information. The expected result is Negative.  Fact Sheet for Patients:  EntrepreneurPulse.com.au  Fact Sheet for Healthcare Providers:  IncredibleEmployment.be  This test is no t yet approved or cleared by the [place] FDA and  has been authorized for detection and/or diagnosis of SARS-CoV-2 by FDA under an Emergency Use Authorization (EUA). This EUA will remain  in effect (meaning this test can be used) for the duration of the COVID-19 declaration under Section 564(b)(1) of the Act, 21 U.S.C.section 360bbb-3(b)(1), unless the authorization is terminated  or revoked sooner.       Influenza A by PCR NEGATIVE NEGATIVE Final   Influenza B by PCR NEGATIVE NEGATIVE Final    Comment: (NOTE) The Xpert Xpress SARS-CoV-2/FLU/RSV plus assay is intended as an aid in the diagnosis of influenza from Nasopharyngeal swab specimens and should not be used as a sole basis for treatment. Nasal washings and aspirates are unacceptable for Xpert Xpress SARS-CoV-2/FLU/RSV testing.  Fact Sheet for Patients: EntrepreneurPulse.com.au  Fact Sheet for Healthcare  Providers: IncredibleEmployment.be  This test is not yet approved or cleared by the [place] FDA and has been authorized for detection and/or diagnosis of SARS-CoV-2 by FDA under an Emergency Use Authorization (EUA). This EUA will remain in effect (meaning this test can be used) for the duration of the COVID-19 declaration under Section 564(b)(1) of the Act, 21 U.S.C. section 360bbb-3(b)(1), unless the authorization is terminated or revoked.  Performed at [facility], [place] [address], [place], [place] [address]      Labs: Basic Metabolic Panel: Recent Labs  Lab 01/08/21 1140 01/09/21 0815 01/10/21 0305  NA 142 140 142  K 4.1 5.1 3.6  CL 104 105 111  CO2 27 27 25   GLUCOSE 154* 119* 117*  BUN 14 16 9   CREATININE 0.74 0.81 0.72  CALCIUM 10.1 8.3* 8.2*   Liver Function Tests: Recent Labs  Lab 01/08/21 1140  AST 23  ALT 17  ALKPHOS 57  BILITOT 0.7  PROT 7.2  ALBUMIN 4.0   No results for input(s): LIPASE, AMYLASE in the last 168 hours. No results for input(s): AMMONIA in the last 168 hours. CBC: Recent Labs  Lab 01/08/21 1140 01/08/21 1542 01/09/21 0815 01/09/21 1437 01/09/21 2149 01/10/21 0305 01/10/21 0829  WBC 10.1  --  8.3  --   --  7.1  --   HGB 12.3   < > 11.0*  11.1* 10.2* 11.6* 10.1*  10.2* 10.9*  HCT 36.1   < > 32.9*  32.9* 29.2* 33.2* 29.8*  30.1* 32.2*  MCV 98.9  --  93.5  --   --  93.8  --   PLT 304  --  198  --   --  189  --    < > = values in this interval not displayed.   Cardiac Enzymes: No  results for input(s): CKTOTAL, CKMB, CKMBINDEX, TROPONINI in the last 168 hours. BNP: BNP (last 3 results) Recent Labs    12/04/20 1259  BNP 178.0*    ProBNP (last 3 results) No results for input(s): PROBNP in the last 8760 hours.  CBG: No results for input(s): GLUCAP in the last 168 hours.     Signed:  Nita Sells MD   [facility] 01/10/2021, 1:37 PM

## 2021-01-10 NOTE — Op Note (Signed)
[facility] Patient Name: Kathleen Pope Procedure Date: 01/10/2021 MRN: [id number] Attending MD: Docia Chuck. Henrene Pastor , MD Date of Birth: Dec 29, 1932 CSN: [id number] Age: 87 Admit Type: Inpatient Procedure:                Colonoscopy with cold snare polypectomy x 4; with                            control of bleeding Indications:              Hematochezia. Prior history of sigmoid resection                            for presumed diverticular bleeding. Also documented                            history of recurrent GI bleeding secondary to cecal                            AVMs. Positive CTA in the left colon. Negative                            angiography. Last colonoscopy 2019 to evaluate                            diarrhea (microscopic colitis identified on                            biopsies) was unremarkable. Providers:                Docia Chuck. Henrene Pastor, MD, Kary Kos, RN, Lesia Sago, Technician Referring MD:             [facility] Medicines:                Monitored Anesthesia Care Complications:            No immediate complications. Estimated blood loss:                            None. Estimated Blood Loss:     Estimated blood loss: none. Procedure:                Pre-Anesthesia Assessment:                           - Prior to the procedure, a History and Physical                            was performed, and patient medications and                            allergies were reviewed. The patient's tolerance of                            previous anesthesia was also reviewed.  The risks                            and benefits of the procedure and the sedation                            options and risks were discussed with the patient.                            All questions were answered, and informed consent                            was obtained. Prior Anticoagulants: The patient has                            taken no previous  anticoagulant or antiplatelet                            agents. After reviewing the risks and benefits, the                            patient was deemed in satisfactory condition to                            undergo the procedure.                           After obtaining informed consent, the colonoscope                            was passed under direct vision. Throughout the                            procedure, the patient's blood pressure, pulse, and                            oxygen saturations were monitored continuously. The                            [id number]-[id number] ([id number] Olympus pediatric colonscope                            was introduced through the anus and advanced to the                            the cecum, identified by appendiceal orifice and                            ileocecal valve. The ileocecal valve, appendiceal                            orifice, and rectum were photographed. The quality  of the bowel preparation was good. The colonoscopy                            was performed without difficulty. The patient                            tolerated the procedure well. The bowel preparation                            used was MoviPrep via split dose instruction. Scope In: 11:27:56 AM Scope Out: 12:00:40 PM Scope Withdrawal Time: 0 hours 24 minutes 59 seconds  Total Procedure Duration: 0 hours 32 minutes 44 seconds  Findings:      Four polyps were found in the ascending colon and cecum. The polyps were       2 to 6 mm in size. These polyps were removed with a cold snare.       Resection and retrieval were complete.      A single large angiodysplastic lesion without bleeding was found in the       cecum. Coagulation for bleeding prevention using argon beam was       successful. See images      A few small-mouthed diverticula were found in the sigmoid colon. The       prior sigmoid colonic anastomosis was unremarkable.      The exam was  otherwise without abnormality on direct and retroflexion       views. There is no blood in the colon or active bleeding. Impression:               - Four 2 to 6 mm polyps in the ascending colon and                            in the cecum, removed with a cold snare. Resected                            and retrieved.                           - A single non-bleeding colonic angiodysplastic                            lesion. Treated with argon beam coagulation.                           - Diverticulosis in the sigmoid colon region.                            Status post sigmoid colectomy with unremarkable                            anastomosis.                           - The examination was otherwise normal on direct  and retroflexion views. Moderate Sedation:      none Recommendation:           1. Regular diet                           2. Okay for discharge home this afternoon                           3. Follow-up pathology (we will)                           4. Outpatient GI follow-up will be arranged (we                            will arrange)                           I have discussed the results with the patient and                            the patient's daughter Kathleen Pope by telephone                            [phone]. They have been provided a copy of the                            procedure report                           GI will sign off. Thanks Procedure Code(s):        --- Professional ---                           [phone], 50, Colonoscopy, flexible; with control of                            bleeding, any method                           45385, Colonoscopy, flexible; with removal of                            tumor(s), polyp(s), or other lesion(s) by snare                            technique Diagnosis Code(s):        --- Professional ---                           K63.5, Polyp of colon                           K55.20, Angiodysplasia of colon  without hemorrhage                           K92.1, Melena (includes Hematochezia)  K57.30, Diverticulosis of large intestine without                            perforation or abscess without bleeding CPT copyright 2019 American [employer] Association. All rights reserved. The codes documented in this report are preliminary and upon coder review may  be revised to meet current compliance requirements. Docia Chuck. Henrene Pastor, MD 01/10/2021 12:24:28 PM This report has been signed electronically. Number of Addenda: 0

## 2021-01-11 ENCOUNTER — Other Ambulatory Visit: Payer: Self-pay

## 2021-01-11 ENCOUNTER — Encounter (HOSPITAL_COMMUNITY): Payer: Self-pay

## 2021-01-11 ENCOUNTER — Telehealth: Payer: Self-pay | Admitting: Gastroenterology

## 2021-01-11 ENCOUNTER — Inpatient Hospital Stay (HOSPITAL_COMMUNITY)
Admission: EM | Admit: 2021-01-11 | Discharge: 2021-01-29 | DRG: 329 | Disposition: A | Discharge status: Home Health | Payer: Medicare Other | Attending: Surgery | Admitting: Surgery

## 2021-01-11 ENCOUNTER — Emergency Department (HOSPITAL_COMMUNITY): Payer: Medicare Other

## 2021-01-11 DIAGNOSIS — D62 Acute posthemorrhagic anemia: Secondary | ICD-10-CM | POA: Diagnosis present

## 2021-01-11 DIAGNOSIS — K644 Residual hemorrhoidal skin tags: Secondary | ICD-10-CM | POA: Diagnosis not present

## 2021-01-11 DIAGNOSIS — E7800 Pure hypercholesterolemia, unspecified: Secondary | ICD-10-CM | POA: Diagnosis not present

## 2021-01-11 DIAGNOSIS — K222 Esophageal obstruction: Secondary | ICD-10-CM | POA: Diagnosis present

## 2021-01-11 DIAGNOSIS — B370 Candidal stomatitis: Secondary | ICD-10-CM | POA: Diagnosis not present

## 2021-01-11 DIAGNOSIS — E876 Hypokalemia: Secondary | ICD-10-CM | POA: Diagnosis not present

## 2021-01-11 DIAGNOSIS — K658 Other peritonitis: Secondary | ICD-10-CM | POA: Diagnosis not present

## 2021-01-11 DIAGNOSIS — K648 Other hemorrhoids: Secondary | ICD-10-CM | POA: Diagnosis not present

## 2021-01-11 DIAGNOSIS — E8770 Fluid overload, unspecified: Secondary | ICD-10-CM | POA: Diagnosis present

## 2021-01-11 DIAGNOSIS — K625 Hemorrhage of anus and rectum: Secondary | ICD-10-CM | POA: Diagnosis not present

## 2021-01-11 DIAGNOSIS — R188 Other ascites: Secondary | ICD-10-CM | POA: Diagnosis present

## 2021-01-11 DIAGNOSIS — R012 Other cardiac sounds: Secondary | ICD-10-CM | POA: Diagnosis not present

## 2021-01-11 DIAGNOSIS — R198 Other specified symptoms and signs involving the digestive system and abdomen: Secondary | ICD-10-CM

## 2021-01-11 DIAGNOSIS — I350 Nonrheumatic aortic (valve) stenosis: Secondary | ICD-10-CM | POA: Diagnosis present

## 2021-01-11 DIAGNOSIS — D72829 Elevated white blood cell count, unspecified: Secondary | ICD-10-CM | POA: Diagnosis not present

## 2021-01-11 DIAGNOSIS — Z20822 Contact with and (suspected) exposure to covid-19: Secondary | ICD-10-CM | POA: Diagnosis not present

## 2021-01-11 DIAGNOSIS — R739 Hyperglycemia, unspecified: Secondary | ICD-10-CM | POA: Diagnosis present

## 2021-01-11 DIAGNOSIS — D649 Anemia, unspecified: Secondary | ICD-10-CM | POA: Diagnosis not present

## 2021-01-11 DIAGNOSIS — R0602 Shortness of breath: Secondary | ICD-10-CM | POA: Diagnosis not present

## 2021-01-11 DIAGNOSIS — K922 Gastrointestinal hemorrhage, unspecified: Secondary | ICD-10-CM | POA: Diagnosis not present

## 2021-01-11 DIAGNOSIS — E785 Hyperlipidemia, unspecified: Secondary | ICD-10-CM | POA: Diagnosis present

## 2021-01-11 DIAGNOSIS — J9811 Atelectasis: Secondary | ICD-10-CM | POA: Diagnosis not present

## 2021-01-11 DIAGNOSIS — J811 Chronic pulmonary edema: Secondary | ICD-10-CM | POA: Diagnosis not present

## 2021-01-11 DIAGNOSIS — K5521 Angiodysplasia of colon with hemorrhage: Secondary | ICD-10-CM | POA: Diagnosis not present

## 2021-01-11 DIAGNOSIS — E782 Mixed hyperlipidemia: Secondary | ICD-10-CM | POA: Diagnosis not present

## 2021-01-11 DIAGNOSIS — R578 Other shock: Secondary | ICD-10-CM | POA: Diagnosis not present

## 2021-01-11 DIAGNOSIS — K633 Ulcer of intestine: Secondary | ICD-10-CM | POA: Diagnosis present

## 2021-01-11 DIAGNOSIS — Z803 Family history of malignant neoplasm of breast: Secondary | ICD-10-CM

## 2021-01-11 DIAGNOSIS — K5190 Ulcerative colitis, unspecified, without complications: Secondary | ICD-10-CM | POA: Diagnosis not present

## 2021-01-11 DIAGNOSIS — K921 Melena: Secondary | ICD-10-CM | POA: Diagnosis present

## 2021-01-11 DIAGNOSIS — E43 Unspecified severe protein-calorie malnutrition: Secondary | ICD-10-CM | POA: Diagnosis present

## 2021-01-11 DIAGNOSIS — E861 Hypovolemia: Secondary | ICD-10-CM | POA: Diagnosis present

## 2021-01-11 DIAGNOSIS — Z79899 Other long term (current) drug therapy: Secondary | ICD-10-CM

## 2021-01-11 DIAGNOSIS — R531 Weakness: Secondary | ICD-10-CM | POA: Diagnosis not present

## 2021-01-11 DIAGNOSIS — Z681 Body mass index (BMI) 19 or less, adult: Secondary | ICD-10-CM

## 2021-01-11 DIAGNOSIS — Z801 Family history of malignant neoplasm of trachea, bronchus and lung: Secondary | ICD-10-CM

## 2021-01-11 DIAGNOSIS — J449 Chronic obstructive pulmonary disease, unspecified: Secondary | ICD-10-CM | POA: Diagnosis not present

## 2021-01-11 DIAGNOSIS — J90 Pleural effusion, not elsewhere classified: Secondary | ICD-10-CM | POA: Diagnosis not present

## 2021-01-11 DIAGNOSIS — Z66 Do not resuscitate: Secondary | ICD-10-CM | POA: Diagnosis present

## 2021-01-11 DIAGNOSIS — E871 Hypo-osmolality and hyponatremia: Secondary | ICD-10-CM

## 2021-01-11 DIAGNOSIS — D75839 Thrombocytosis, unspecified: Secondary | ICD-10-CM | POA: Diagnosis present

## 2021-01-11 DIAGNOSIS — D122 Benign neoplasm of ascending colon: Secondary | ICD-10-CM | POA: Diagnosis not present

## 2021-01-11 DIAGNOSIS — J9601 Acute respiratory failure with hypoxia: Secondary | ICD-10-CM | POA: Diagnosis not present

## 2021-01-11 DIAGNOSIS — K626 Ulcer of anus and rectum: Secondary | ICD-10-CM | POA: Diagnosis not present

## 2021-01-11 DIAGNOSIS — K52831 Collagenous colitis: Secondary | ICD-10-CM | POA: Diagnosis present

## 2021-01-11 DIAGNOSIS — K6389 Other specified diseases of intestine: Secondary | ICD-10-CM | POA: Diagnosis not present

## 2021-01-11 DIAGNOSIS — K2971 Gastritis, unspecified, with bleeding: Secondary | ICD-10-CM

## 2021-01-11 DIAGNOSIS — R571 Hypovolemic shock: Secondary | ICD-10-CM | POA: Diagnosis not present

## 2021-01-11 DIAGNOSIS — Z86010 Personal history of colonic polyps: Secondary | ICD-10-CM

## 2021-01-11 DIAGNOSIS — F17210 Nicotine dependence, cigarettes, uncomplicated: Secondary | ICD-10-CM | POA: Diagnosis present

## 2021-01-11 DIAGNOSIS — Z7982 Long term (current) use of aspirin: Secondary | ICD-10-CM

## 2021-01-11 DIAGNOSIS — K449 Diaphragmatic hernia without obstruction or gangrene: Secondary | ICD-10-CM | POA: Diagnosis not present

## 2021-01-11 DIAGNOSIS — Z7951 Long term (current) use of inhaled steroids: Secondary | ICD-10-CM

## 2021-01-11 DIAGNOSIS — R0600 Dyspnea, unspecified: Secondary | ICD-10-CM

## 2021-01-11 DIAGNOSIS — K52839 Microscopic colitis, unspecified: Secondary | ICD-10-CM | POA: Diagnosis present

## 2021-01-11 DIAGNOSIS — K5731 Diverticulosis of large intestine without perforation or abscess with bleeding: Secondary | ICD-10-CM | POA: Diagnosis not present

## 2021-01-11 DIAGNOSIS — K559 Vascular disorder of intestine, unspecified: Secondary | ICD-10-CM | POA: Diagnosis not present

## 2021-01-11 DIAGNOSIS — K55041 Focal (segmental) acute infarction of large intestine: Principal | ICD-10-CM | POA: Diagnosis present

## 2021-01-11 DIAGNOSIS — K660 Peritoneal adhesions (postprocedural) (postinfection): Secondary | ICD-10-CM | POA: Diagnosis present

## 2021-01-11 DIAGNOSIS — K5520 Angiodysplasia of colon without hemorrhage: Secondary | ICD-10-CM | POA: Diagnosis not present

## 2021-01-11 DIAGNOSIS — I10 Essential (primary) hypertension: Secondary | ICD-10-CM | POA: Diagnosis not present

## 2021-01-11 DIAGNOSIS — E7849 Other hyperlipidemia: Secondary | ICD-10-CM | POA: Diagnosis not present

## 2021-01-11 DIAGNOSIS — Z932 Ileostomy status: Secondary | ICD-10-CM

## 2021-01-11 LAB — RESP PANEL BY RT-PCR (FLU A&B, COVID) ARPGX2
Influenza A by PCR: NEGATIVE
Influenza B by PCR: NEGATIVE
SARS Coronavirus 2 by RT PCR: NEGATIVE

## 2021-01-11 LAB — CBC WITH DIFFERENTIAL/PLATELET
Abs Immature Granulocytes: 0.03 10*3/uL (ref 0.00–0.07)
Basophils Absolute: 0 10*3/uL (ref 0.0–0.1)
Basophils Relative: 0 %
Eosinophils Absolute: 0.2 10*3/uL (ref 0.0–0.5)
Eosinophils Relative: 2 %
HCT: 25.7 % — ABNORMAL LOW (ref 36.0–46.0)
Hemoglobin: 8.7 g/dL — ABNORMAL LOW (ref 12.0–15.0)
Immature Granulocytes: 0 %
Lymphocytes Relative: 13 %
Lymphs Abs: 1.1 10*3/uL (ref 0.7–4.0)
MCH: 33 pg (ref 26.0–34.0)
MCHC: 33.9 g/dL (ref 30.0–36.0)
MCV: 97.3 fL (ref 80.0–100.0)
Monocytes Absolute: 0.8 10*3/uL (ref 0.1–1.0)
Monocytes Relative: 10 %
Neutro Abs: 6.2 10*3/uL (ref 1.7–7.7)
Neutrophils Relative %: 75 %
Platelets: 225 10*3/uL (ref 150–400)
RBC: 2.64 MIL/uL — ABNORMAL LOW (ref 3.87–5.11)
RDW: 15.1 % (ref 11.5–15.5)
WBC: 8.4 10*3/uL (ref 4.0–10.5)
nRBC: 0 % (ref 0.0–0.2)

## 2021-01-11 LAB — COMPREHENSIVE METABOLIC PANEL
ALT: 15 U/L (ref 0–44)
AST: 19 U/L (ref 15–41)
Albumin: 3 g/dL — ABNORMAL LOW (ref 3.5–5.0)
Alkaline Phosphatase: 43 U/L (ref 38–126)
Anion gap: 10 (ref 5–15)
BUN: 16 mg/dL (ref 8–23)
CO2: 27 mmol/L (ref 22–32)
Calcium: 8.4 mg/dL — ABNORMAL LOW (ref 8.9–10.3)
Chloride: 106 mmol/L (ref 98–111)
Creatinine, Ser: 0.85 mg/dL (ref 0.44–1.00)
GFR, Estimated: >60 mL/min (ref >60)
Glucose, Bld: 200 mg/dL — ABNORMAL HIGH (ref 70–99)
Potassium: 3.6 mmol/L (ref 3.5–5.1)
Sodium: 143 mmol/L (ref 135–145)
Total Bilirubin: 0.4 mg/dL (ref 0.3–1.2)
Total Protein: 5.4 g/dL — ABNORMAL LOW (ref 6.5–8.1)

## 2021-01-11 MED ORDER — IOHEXOL 350 MG/ML SOLN
100.0000 mL | Freq: Once | INTRAVENOUS | Status: AC | PRN
  Administered 2021-01-11: 100 mL via INTRAVENOUS

## 2021-01-11 MED ORDER — SODIUM CHLORIDE 0.9% IV SOLUTION: Freq: Once | INTRAVENOUS | Status: DC

## 2021-01-11 MED ORDER — SODIUM CHLORIDE 0.9 % IV BOLUS
500.0000 mL | Freq: Once | INTRAVENOUS | Status: AC
  Administered 2021-01-11: 500 mL via INTRAVENOUS

## 2021-01-11 NOTE — H&P (Signed)
History and Physical   Kathleen Pope WCH:[id number] DOB: 1933/06/26 DOA: 01/11/2021  Referring MD/NP/PA: Dr. Eulis Foster  PCP: Irene Shipper, MD   Outpatient Specialists: Dr. Scarlette Shorts, GI  Patient coming from: Home  Chief Complaint: Rectal bleed  HPI: Kathleen Pope is a 87 y.o. female with medical history significant of recurrent rectal bleed due to AVMs and polyps, COPD, hypertension, hyperlipidemia, diverticulosis who was just discharged from the hospital yesterday secondary to admission for GI bleed.  During that hospitalization she had a bleeding scan done that was positive with mesenteric CT and she was embolized.  This was done on February 24.  She also had colonoscopy on 226 that showed multiple small polyps.  Patient went home on 1 day later was having massive rectal bleed.  She is still noted to be bleeding in the ER.  GI consulted.  Patient initiated on IV Protonix and recommendation was to repeat repeated CT angiogram.  This is now positive for active bleeding at the same site from previous.  Patient being readmitted to the hospital for further evaluation and treatment..  ED Course: Temperature is 98.2 blood pressure 100/64 dropping to 80s momentarily pulse 130 respirate 20 oxygen sat 98% on room air.  White count 8.4 hemoglobin has dropped to 8.7 from 10.1 overnight platelets 224.  Sodium 143 potassium 3.6 chloride 106 CO2 27 BUN 16 creatinine 0.85 calcium 8.4.  Glucose is 200.  CT angiogram of the abdomen and pelvis showed active bleeding in the mesenteric site where patient recently had embolization.  Interventional radiology has been paged and patient will be admitted for further evaluation and treatment  Review of Systems: As per HPI otherwise 10 point review of systems negative.    Past Medical History:  Diagnosis Date  . Anemia    pt denies, yet takes iron  . Chronic airway obstruction, not elsewhere classified   . COPD (chronic obstructive pulmonary disease) ([place])   .  Diverticulosis   . GI hemorrhage from post-treatment cecal ulcer 08/18/2012  . Hemorrhoids   . HTN (hypertension)   . Hypercholesteremia   . Lower GI bleed multiple   AVM's    Past Surgical History:  Procedure Laterality Date  . BUNIONECTOMY     left  . COLECTOMY    . COLONOSCOPY  08/10/2012   Procedure: COLONOSCOPY;  Surgeon: Ladene Artist, MD,FACG;  Location: [facility];  Service: Endoscopy;  Laterality: N/A;  . COLONOSCOPY  08/19/2012   Procedure: COLONOSCOPY;  Surgeon: Gatha Mayer, MD;  Location: [facility];  Service: Endoscopy;  Laterality: N/A;  . COLONOSCOPY N/A 03/11/2013   Procedure: COLONOSCOPY;  Surgeon: Irene Shipper, MD;  Location: [facility];  Service: Endoscopy;  Laterality: N/A;  . IR ANGIOGRAM VISCERAL SELECTIVE  01/09/2021  . IR US GUIDE VASC ACCESS RIGHT  01/09/2021  . TONSILLECTOMY       reports that she has been smoking cigarettes. She has a 59.00 pack-year smoking history. She has never used smokeless tobacco. She reports that she does not drink alcohol and does not use drugs.  No Known Allergies  Family History  Problem Relation Age of Onset  . Breast cancer Mother   . Lung cancer Father   . Breast cancer Sister   . Anuerysm Brother        in stomach  . Colon cancer Neg Hx   . Esophageal cancer Neg Hx   . Rectal cancer Neg Hx      Prior to Admission medications  Medication Sig Start Date End Date Taking? Authorizing Provider  aspirin EC 81 MG tablet Take 1 tablet (81 mg total) by mouth every other day. 01/17/21  Yes Nita Sells, MD  budesonide (ENTOCORT EC) 3 MG 24 hr capsule Take 9mg  for one month; then 6 mg for one month; then 3 mg for one month; then stop Patient taking differently: Take 6 mg by mouth daily. Take 9mg  for one month; then 6 mg for one month; then 3 mg for one month; then stop 03/12/20  Yes Irene Shipper, MD  calcium carbonate (OS-CAL) 600 MG TABS Take 600 mg by mouth daily.   Yes [provider]  ECHINACEA PO  Take 1 capsule by mouth daily.   Yes [provider]  ferrous sulfate 325 (65 FE) MG EC tablet Take 325 mg by mouth 2 (two) times daily.    Yes [provider]  losartan (COZAAR) 50 MG tablet Take 50 mg by mouth daily.   Yes [provider]  pravastatin (PRAVACHOL) 10 MG tablet Take 10 mg by mouth daily.   Yes [provider]  pantoprazole (PROTONIX) 40 MG tablet Take 1 tablet (40 mg total) by mouth 2 (two) times daily. 01/10/21 03/11/21  Nita Sells, MD    Physical Exam: Vitals:   01/11/21 2215 01/11/21 2230 01/11/21 2245 01/11/21 2315  BP: (!) 164/78 (!) 148/78 109/65 112/74  Pulse:  100 96 95  Resp: 19 15 16 15   Temp:      TempSrc:      SpO2:  100% 99% 98%  Weight:      Height:          Constitutional: Anxious and worried Vitals:   01/11/21 2215 01/11/21 2230 01/11/21 2245 01/11/21 2315  BP: (!) 164/78 (!) 148/78 109/65 112/74  Pulse:  100 96 95  Resp: 19 15 16 15   Temp:      TempSrc:      SpO2:  100% 99% 98%  Weight:      Height:       Eyes: PERRL, lids and conjunctivae normal ENMT: Mucous membranes are moist. Posterior pharynx clear of any exudate or lesions.Normal dentition.  Neck: normal, supple, no masses, no thyromegaly Respiratory: clear to auscultation bilaterally, no wheezing, no crackles. Normal respiratory effort. No accessory muscle use.  Cardiovascular: Sinus tachycardia, no murmurs / rubs / gallops. No extremity edema. 2+ pedal pulses. No carotid bruits.  Abdomen: no tenderness, no masses palpated. No hepatosplenomegaly. Bowel sounds positive.  Musculoskeletal: no clubbing / cyanosis. No joint deformity upper and lower extremities. Good ROM, no contractures. Normal muscle tone.  Skin: no rashes, lesions, ulcers. No induration Neurologic: CN 2-12 grossly intact. Sensation intact, DTR normal. Strength 5/5 in all 4.  Psychiatric: Normal judgment and insight. Alert and oriented x 3. Normal mood.     Labs on  Admission: I have personally reviewed following labs and imaging studies  CBC: Recent Labs  Lab 01/08/21 1140 01/08/21 1542 01/09/21 0815 01/09/21 1437 01/09/21 2149 01/10/21 0305 01/10/21 0829 01/11/21 2015  WBC 10.1  --  8.3  --   --  7.1  --  8.4  NEUTROABS  --   --   --   --   --   --   --  6.2  HGB 12.3   < > 11.0*  11.1* 10.2* 11.6* 10.1*  10.2* 10.9* 8.7*  HCT 36.1   < > 32.9*  32.9* 29.2* 33.2* 29.8*  30.1* 32.2* 25.7*  MCV  98.9  --  93.5  --   --  93.8  --  97.3  PLT 304  --  198  --   --  189  --  225   < > = values in this interval not displayed.   Basic Metabolic Panel: Recent Labs  Lab 01/08/21 1140 01/09/21 0815 01/10/21 0305 01/11/21 2015  NA 142 140 142 143  K 4.1 5.1 3.6 3.6  CL 104 105 111 106  CO2 27 27 25 27   GLUCOSE 154* 119* 117* 200*  BUN 14 16 9 16   CREATININE 0.74 0.81 0.72 0.85  CALCIUM 10.1 8.3* 8.2* 8.4*   GFR: Estimated Creatinine Clearance: 30.5 mL/min (by C-G formula based on SCr of 0.85 mg/dL). Liver Function Tests: Recent Labs  Lab 01/08/21 1140 01/11/21 2015  AST 23 19  ALT 17 15  ALKPHOS 57 43  BILITOT 0.7 0.4  PROT 7.2 5.4*  ALBUMIN 4.0 3.0*   No results for input(s): LIPASE, AMYLASE in the last 168 hours. No results for input(s): AMMONIA in the last 168 hours. Coagulation Profile: Recent Labs  Lab 01/08/21 1140  INR 0.9   Cardiac Enzymes: No results for input(s): CKTOTAL, CKMB, CKMBINDEX, TROPONINI in the last 168 hours. BNP (last 3 results) No results for input(s): PROBNP in the last 8760 hours. HbA1C: No results for input(s): HGBA1C in the last 72 hours. CBG: No results for input(s): GLUCAP in the last 168 hours. Lipid Profile: No results for input(s): CHOL, HDL, LDLCALC, TRIG, CHOLHDL, LDLDIRECT in the last 72 hours. Thyroid Function Tests: No results for input(s): TSH, T4TOTAL, FREET4, T3FREE, THYROIDAB in the last 72 hours. Anemia Panel: No results for input(s): VITAMINB12, FOLATE, FERRITIN, TIBC,  IRON, RETICCTPCT in the last 72 hours. Urine analysis:    Component Value Date/Time   COLORURINE YELLOW 03/09/2013 2028   APPEARANCEUR CLEAR 03/09/2013 2028   LABSPEC 1.036 (H) 03/09/2013 2028   PHURINE 6.5 03/09/2013 2028   GLUCOSEU 500 (A) 03/09/2013 2028   HGBUR NEGATIVE 03/09/2013 2028   BILIRUBINUR NEGATIVE 03/09/2013 2028   KETONESUR 15 (A) 03/09/2013 2028   PROTEINUR NEGATIVE 03/09/2013 2028   UROBILINOGEN 1.0 03/09/2013 2028   NITRITE NEGATIVE 03/09/2013 2028   LEUKOCYTESUR NEGATIVE 03/09/2013 2028   Sepsis Labs: @LABRCNTIP (procalcitonin:4,lacticidven:4) ) Recent Results (from the past 240 hour(s))  Resp Panel by RT-PCR (Flu A&B, Covid) Nasopharyngeal Swab     Status: None   Collection Time: 01/08/21  2:51 PM   Specimen: Nasopharyngeal Swab; Nasopharyngeal(NP) swabs in vial transport medium  Result Value Ref Range Status   SARS Coronavirus 2 by RT PCR NEGATIVE NEGATIVE Final    Comment: (NOTE) SARS-CoV-2 target nucleic acids are NOT DETECTED.  The SARS-CoV-2 RNA is generally detectable in upper respiratory specimens during the acute phase of infection. The lowest concentration of SARS-CoV-2 viral copies this assay can detect is 138 copies/mL. A negative result does not preclude SARS-Cov-2 infection and should not be used as the sole basis for treatment or other patient management decisions. A negative result may occur with  improper specimen collection/handling, submission of specimen other than nasopharyngeal swab, presence of viral mutation(s) within the areas targeted by this assay, and inadequate number of viral copies(<138 copies/mL). A negative result must be combined with clinical observations, patient history, and epidemiological information. The expected result is Negative.  Fact Sheet for Patients:  EntrepreneurPulse.com.au  Fact Sheet for Healthcare Providers:  IncredibleEmployment.be  This test is no t yet approved  or cleared by the [place]  FDA and  has been authorized for detection and/or diagnosis of SARS-CoV-2 by FDA under an Emergency Use Authorization (EUA). This EUA will remain  in effect (meaning this test can be used) for the duration of the COVID-19 declaration under Section 564(b)(1) of the Act, 21 U.S.C.section 360bbb-3(b)(1), unless the authorization is terminated  or revoked sooner.       Influenza A by PCR NEGATIVE NEGATIVE Final   Influenza B by PCR NEGATIVE NEGATIVE Final    Comment: (NOTE) The Xpert Xpress SARS-CoV-2/FLU/RSV plus assay is intended as an aid in the diagnosis of influenza from Nasopharyngeal swab specimens and should not be used as a sole basis for treatment. Nasal washings and aspirates are unacceptable for Xpert Xpress SARS-CoV-2/FLU/RSV testing.  Fact Sheet for Patients: EntrepreneurPulse.com.au  Fact Sheet for Healthcare Providers: IncredibleEmployment.be  This test is not yet approved or cleared by the [place] FDA and has been authorized for detection and/or diagnosis of SARS-CoV-2 by FDA under an Emergency Use Authorization (EUA). This EUA will remain in effect (meaning this test can be used) for the duration of the COVID-19 declaration under Section 564(b)(1) of the Act, 21 U.S.C. section 360bbb-3(b)(1), unless the authorization is terminated or revoked.  Performed at [facility], [place] [address] [place], [place] [address]   Resp Panel by RT-PCR (Flu A&B, Covid) Nasopharyngeal Swab     Status: None   Collection Time: 01/11/21  7:53 PM   Specimen: Nasopharyngeal Swab; Nasopharyngeal(NP) swabs in vial transport medium  Result Value Ref Range Status   SARS Coronavirus 2 by RT PCR NEGATIVE NEGATIVE Final    Comment: (NOTE) SARS-CoV-2 target nucleic acids are NOT DETECTED.  The SARS-CoV-2 RNA is generally detectable in upper respiratory specimens during the acute phase of infection.  The lowest concentration of SARS-CoV-2 viral copies this assay can detect is 138 copies/mL. A negative result does not preclude SARS-Cov-2 infection and should not be used as the sole basis for treatment or other patient management decisions. A negative result may occur with  improper specimen collection/handling, submission of specimen other than nasopharyngeal swab, presence of viral mutation(s) within the areas targeted by this assay, and inadequate number of viral copies(<138 copies/mL). A negative result must be combined with clinical observations, patient history, and epidemiological information. The expected result is Negative.  Fact Sheet for Patients:  EntrepreneurPulse.com.au  Fact Sheet for Healthcare Providers:  IncredibleEmployment.be  This test is no t yet approved or cleared by the [place] FDA and  has been authorized for detection and/or diagnosis of SARS-CoV-2 by FDA under an Emergency Use Authorization (EUA). This EUA will remain  in effect (meaning this test can be used) for the duration of the COVID-19 declaration under Section 564(b)(1) of the Act, 21 U.S.C.section 360bbb-3(b)(1), unless the authorization is terminated  or revoked sooner.       Influenza A by PCR NEGATIVE NEGATIVE Final   Influenza B by PCR NEGATIVE NEGATIVE Final    Comment: (NOTE) The Xpert Xpress SARS-CoV-2/FLU/RSV plus assay is intended as an aid in the diagnosis of influenza from Nasopharyngeal swab specimens and should not be used as a sole basis for treatment. Nasal washings and aspirates are unacceptable for Xpert Xpress SARS-CoV-2/FLU/RSV testing.  Fact Sheet for Patients: EntrepreneurPulse.com.au  Fact Sheet for Healthcare Providers: IncredibleEmployment.be  This test is not yet approved or cleared by the [place] FDA and has been authorized for detection and/or diagnosis of SARS-CoV-2 by FDA  under an Emergency Use Authorization (EUA). This  EUA will remain in effect (meaning this test can be used) for the duration of the COVID-19 declaration under Section 564(b)(1) of the Act, 21 U.S.C. section 360bbb-3(b)(1), unless the authorization is terminated or revoked.  Performed at [facility], [place] [address], [place], [place] [address]      Radiological Exams on Admission: CT Angio Abd/Pel W and/or Wo Contrast  Result Date: 01/11/2021 CLINICAL DATA:  Gastrointestinal bleed.  bright red blood. EXAM: CTA ABDOMEN AND PELVIS WITHOUT AND WITH CONTRAST TECHNIQUE: Multidetector CT imaging of the abdomen and pelvis was performed using the standard protocol during bolus administration of intravenous contrast. Multiplanar reconstructed images and MIPs were obtained and reviewed to evaluate the vascular anatomy. CONTRAST:  137mL OMNIPAQUE IOHEXOL 350 MG/ML SOLN COMPARISON:  Nuclear medicine gastrointestinal bleeding scan 01/08/2021, CT abdomen pelvis 01/08/2021 FINDINGS: VASCULAR Aorta: Severe atherosclerotic plaque. Normal caliber aorta without aneurysm, dissection, vasculitis or significant stenosis. Celiac: Patent without evidence of aneurysm, dissection, vasculitis or significant stenosis. SMA: Patent without evidence of aneurysm, dissection, vasculitis or significant stenosis. Renals: Mild atherosclerotic plaque of the origins. Both renal arteries are patent without evidence of aneurysm, dissection, vasculitis, fibromuscular dysplasia or significant stenosis. IMA: Patent without evidence of aneurysm, dissection, vasculitis or significant stenosis. Inflow: Severe atherosclerotic plaque. Patent without evidence of aneurysm, dissection, vasculitis or significant stenosis. Proximal Outflow: At least moderate atherosclerotic plaque. Bilateral common femoral and visualized portions of the superficial and profunda femoral arteries are patent without evidence of aneurysm, dissection,  vasculitis or significant stenosis. Veins: The hepatic, portal, splenic, superior mesenteric veins are patent. NON-VASCULAR Lower chest: Interval development of a 1.6 x 1.3 cm right lower lobe pulmonary nodular like consolidation likely represents atelectasis (not visualized on CT 2 days ago. Subsegmental atelectasis within bilateral lower lobes. Trace left pleural effusion. Hepatobiliary: No focal liver abnormality. No gallstones, gallbladder wall thickening, or pericholecystic fluid. No biliary dilatation. Pancreas: No focal lesion. Normal pancreatic contour. No surrounding inflammatory changes. No main pancreatic ductal dilatation. Spleen: Normal in size without focal abnormality. Adrenals/Urinary Tract: No adrenal nodule bilaterally. Bilateral kidneys enhance symmetrically. Subcentimeter hypodensities within the kidneys are too small to characterize. No hydronephrosis. No hydroureter. The urinary bladder is unremarkable. Stomach/Bowel: Left distal descending colon/sigmoid colon reanastomosis. Layering hyperdensity within the mid to distal descending colon is noted to slightly increase in density on the portal venous phase but is also not visualized on noncontrast study. Associated slightly hyperdense material within the colonic lumen distally. The colon is noted to be fluid-filled past the splenic flexure. No bowel wall thickening or dilatation. The appendix not definitely identified. Lymphatic: No lymphadenopathy. Reproductive: Status post hysterectomy. No adnexal masses. Other: No intraperitoneal free fluid. No intraperitoneal free gas. No organized fluid collection. Musculoskeletal: No abdominal wall hernia or abnormality. No suspicious lytic or blastic osseous lesions. No acute displaced fracture. Multilevel degenerative changes of the spine. 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 IMPRESSION: VASCULAR Aortic Atherosclerosis (ICD10-I70.0). NON-VASCULAR  Mid to distal descending colon gastrointestinal hemorrhage that is located proximal to the colonic anastomosis. Electronically Signed   By: Iven Finn M.D.   On: 01/11/2021 22:36     Assessment/Plan Principal Problem:   Rectal bleed Active Problems:   Chronic lower GI bleeding   AVM (arteriovenous malformation) of colon   HTN (hypertension)   Acute blood loss anemia     #1 rectal bleed: Patient has active bleeding based on CT angiogram.  Initiated on IV Protonix.  Serial H&H.  Keeping her n.p.o.  Patient will likely need another embolization to stop the bleeding.  Discussed case with GI.  Also waiting for callback from interventional radiology.  #2 essential hypertension: Hold blood pressure medications.  #3 acute blood loss anemia: Patient is awaiting transfusion of packed red blood cells.  2 units have been ordered.  We will have 2 more units pending.  #4 COPD: No acute exacerbation   DVT prophylaxis: SCD Code Status: Full code Family Communication: No family at bedside Disposition Plan: Home Consults called: Dr. Havery Moros, GI Admission status: Inpatient  Severity of Illness: The appropriate patient status for this patient is INPATIENT. Inpatient status is judged to be reasonable and necessary in order to provide the required intensity of service to ensure the patient's safety. The patient's presenting symptoms, physical exam findings, and initial radiographic and laboratory data in the context of their chronic comorbidities is felt to place them at high risk for further clinical deterioration. Furthermore, it is not anticipated that the patient will be medically stable for discharge from the hospital within 2 midnights of admission. The following factors support the patient status of inpatient.   " The patient's presenting symptoms include rectal bleed. " The worrisome physical exam findings include hypotension. " The initial radiographic and laboratory data are worrisome  because of hemoglobin drop to 8.7. " The chronic co-morbidities include recurrent GI bleed and polyps.   * I certify that at the point of admission it is my clinical judgment that the patient will require inpatient hospital care spanning beyond 2 midnights from the point of admission due to high intensity of service, high risk for further deterioration and high frequency of surveillance required.Barbette Merino MD [facility] Pager [phone]  If 7PM-7AM, please contact night-coverage [email or web address] Password [facility]  01/11/2021, 11:51 PM

## 2021-01-11 NOTE — ED Provider Notes (Signed)
Kathleen Pope   CSN: [id number] Arrival date & time: 01/11/21  1902     History Chief Complaint  Patient presents with  . GI Bleeding    Kathleen Pope is a 87 y.o. female.  The history is provided by the patient and a relative (Daughter).        Kathleen Pope is a 87 y.o. female, with a history of anemia, COPD, HTN, hypercholesterolemia, presenting to the ED accompanied by her daughter with right red blood per rectum beginning around 6 PM this evening.  Patient underwent colonoscopy yesterday performed by Dr. Henrene Pastor with [facility] due to blood per rectum noted on February 24. She had a nonbloody bowel movement yesterday after the colonoscopy. This evening, she had 3 instances of bright red blood filling the toilet bowl. Also endorses generalized weakness. Additionally, she has continued to ooze blood, almost filling one pad since onset. Denies anticoagulation.  Last food intake around 5 PM this evening. Denies fever/chills, abdominal pain, chest pain, shortness of breath, dizziness, syncope, or any other complaints.  Past Medical History:  Diagnosis Date  . Anemia    pt denies, yet takes iron  . Chronic airway obstruction, not elsewhere classified   . COPD (chronic obstructive pulmonary disease) ([place])   . Diverticulosis   . GI hemorrhage from post-treatment cecal ulcer 08/18/2012  . Hemorrhoids   . HTN (hypertension)   . Hypercholesteremia   . Lower GI bleed multiple   AVM's    Patient Active Problem List   Diagnosis Date Noted  . Benign neoplasm of cecum   . Benign neoplasm of ascending colon   . Abnormal CT of the abdomen   . GI bleed 01/08/2021  . Pleural effusion on right 11/27/2020  . Loose stools 04/06/2017  . De Quervain's disease (tenosynovitis) 03/14/2014  . Rectal bleeding 03/09/2013  . Hematochezia 03/09/2013  . External hemorrhoids 03/08/2013  . Chronic lower GI bleeding 08/18/2012  . GI hemorrhage from  post-treatment cecal ulcer 08/18/2012  . AVM (arteriovenous malformation) of colon 08/18/2012  . HTN (hypertension) 08/18/2012  . Acute blood loss anemia 08/18/2012  . Bruit 08/04/2012  . Mixed hyperlipidemia 08/04/2012  . Cigarette smoker 08/04/2012  . Murmur 08/04/2012    Past Surgical History:  Procedure Laterality Date  . BUNIONECTOMY     left  . COLECTOMY    . COLONOSCOPY  08/10/2012   Procedure: COLONOSCOPY;  Surgeon: Ladene Artist, MD,FACG;  Location: [facility];  Service: Endoscopy;  Laterality: N/A;  . COLONOSCOPY  08/19/2012   Procedure: COLONOSCOPY;  Surgeon: Gatha Mayer, MD;  Location: [facility];  Service: Endoscopy;  Laterality: N/A;  . COLONOSCOPY N/A 03/11/2013   Procedure: COLONOSCOPY;  Surgeon: Irene Shipper, MD;  Location: [facility];  Service: Endoscopy;  Laterality: N/A;  . IR ANGIOGRAM VISCERAL SELECTIVE  01/09/2021  . IR US GUIDE VASC ACCESS RIGHT  01/09/2021  . TONSILLECTOMY       OB History   No obstetric history on file.     Family History  Problem Relation Age of Onset  . Breast cancer Mother   . Lung cancer Father   . Breast cancer Sister   . Anuerysm Brother        in stomach  . Colon cancer Neg Hx   . Esophageal cancer Neg Hx   . Rectal cancer Neg Hx     Social History   Tobacco Use  . Smoking status: Current Every Day  Smoker    Packs/day: 1.00    Years: 59.00    Pack years: 59.00    Types: Cigarettes  . Smokeless tobacco: Never Used  . Tobacco comment: tobacco info given 06/09/18  Vaping Use  . Vaping Use: Never used  Substance Use Topics  . Alcohol use: No  . Drug use: No    Home Medications Prior to Admission medications   Medication Sig Start Date End Date Taking? Authorizing Provider  aspirin EC 81 MG tablet Take 1 tablet (81 mg total) by mouth every other day. 01/17/21  Yes Nita Sells, MD  budesonide (ENTOCORT EC) 3 MG 24 hr capsule Take 9mg  for one month; then 6 mg for one month; then 3 mg for one month;  then stop Patient taking differently: Take 6 mg by mouth daily. Take 9mg  for one month; then 6 mg for one month; then 3 mg for one month; then stop 03/12/20  Yes Irene Shipper, MD  calcium carbonate (OS-CAL) 600 MG TABS Take 600 mg by mouth daily.   Yes [provider]  ECHINACEA PO Take 1 capsule by mouth daily.   Yes [provider]  ferrous sulfate 325 (65 FE) MG EC tablet Take 325 mg by mouth 2 (two) times daily.    Yes [provider]  losartan (COZAAR) 50 MG tablet Take 50 mg by mouth daily.   Yes [provider]  pravastatin (PRAVACHOL) 10 MG tablet Take 10 mg by mouth daily.   Yes [provider]  pantoprazole (PROTONIX) 40 MG tablet Take 1 tablet (40 mg total) by mouth 2 (two) times daily. 01/10/21 03/11/21  Nita Sells, MD    Allergies    Patient has no known allergies.  Review of Systems   Review of Systems  Constitutional: Negative for chills, diaphoresis and fever.  Respiratory: Negative for shortness of breath.   Cardiovascular: Negative for chest pain.  Gastrointestinal: Positive for blood in stool. Negative for abdominal pain, nausea and vomiting.  Neurological: Positive for weakness. Negative for dizziness and syncope.  All other systems reviewed and are negative.   Physical Exam Updated Vital Signs BP 107/66 (BP Location: Left Arm)   Pulse (!) 130   Temp 98.2 F (36.8 C) (Oral)   Resp 16   Ht 5' 1.5" (1.562 m)   Wt 39.9 kg   SpO2 100%   BMI 16.36 kg/m   Physical Exam Vitals and nursing Pope reviewed.  Constitutional:      General: She is not in acute distress.    Appearance: She is well-developed. She is not diaphoretic.  HENT:     Head: Normocephalic and atraumatic.     Mouth/Throat:     Mouth: Mucous membranes are moist.     Pharynx: Oropharynx is clear.  Eyes:     Conjunctiva/sclera: Conjunctivae normal.  Cardiovascular:     Rate and Rhythm: Normal rate and regular rhythm.     Pulses: Normal  pulses.          Radial pulses are 2+ on the right side and 2+ on the left side.       Posterior tibial pulses are 2+ on the right side and 2+ on the left side.     Heart sounds: Murmur (known) heard.      Comments: Tactile temperature in the extremities appropriate and equal bilaterally. Pulmonary:     Effort: Pulmonary effort is normal. No respiratory distress.     Breath sounds: Normal breath sounds.  Abdominal:  Palpations: Abdomen is soft.     Tenderness: There is no abdominal tenderness. There is no guarding.  Genitourinary:    Comments: Rectal Exam:  Frank bright red blood.  Blood within a urinary protection pad also noted.  No noted free-flowing hemorrhage. No external hemorrhoids, fissures, or lesions noted.  No stool burden.  No rectal tenderness. No foreign bodies noted.   Musculoskeletal:     Cervical back: Neck supple.  Skin:    General: Skin is warm and dry.  Neurological:     Mental Status: She is alert.  Psychiatric:        Mood and Affect: Mood and affect normal.        Speech: Speech normal.        Behavior: Behavior normal.     ED Results / Procedures / Treatments   Labs (all labs ordered are listed, but only abnormal results are displayed) Labs Reviewed  COMPREHENSIVE METABOLIC PANEL - Abnormal; Notable for the following components:      Result Value   Glucose, Bld 200 (*)    Calcium 8.4 (*)    Total Protein 5.4 (*)    Albumin 3.0 (*)    All other components within normal limits  CBC WITH DIFFERENTIAL/PLATELET - Abnormal; Notable for the following components:   RBC 2.64 (*)    Hemoglobin 8.7 (*)    HCT 25.7 (*)    All other components within normal limits  RESP PANEL BY RT-PCR (FLU A&B, COVID) ARPGX2  TYPE AND SCREEN  PREPARE RBC (CROSSMATCH)   Hemoglobin  Date Value Ref Range Status  01/11/2021 8.7 (L) 12.0 - 15.0 g/dL Final  01/10/2021 10.9 (L) 12.0 - 15.0 g/dL Final  01/10/2021 10.1 (L) 12.0 - 15.0 g/dL Final  01/10/2021 10.2 (L) 12.0  - 15.0 g/dL Final    EKG None  Radiology No results found.  Procedures .Critical Care Performed by: Lorayne Bender, PA-C Authorized by: Lorayne Bender, PA-C   Critical care provider statement:    Critical care time (minutes):  35   Critical care time was exclusive of:  Separately billable procedures and treating other patients   Critical care was necessary to treat or prevent imminent or life-threatening deterioration of the following conditions: Symptomatic anemia from active GI bleeding requiring emergent transfusion.   Critical care was time spent personally by me on the following activities:  Ordering and performing treatments and interventions, ordering and review of laboratory studies, ordering and review of radiographic studies, examination of patient, evaluation of patient's response to treatment, discussions with consultants, development of treatment plan with patient or surrogate, re-evaluation of patient's condition, review of old charts, pulse oximetry and obtaining history from patient or surrogate   I assumed direction of critical care for this patient from another provider in my specialty: no     Care discussed with: admitting provider       Medications Ordered in ED Medications  0.9 %  sodium chloride infusion (Manually program via Guardrails IV Fluids) (has no administration in time range)  sodium chloride 0.9 % bolus 500 mL (0 mLs Intravenous Stopped 01/11/21 2153)  iohexol (OMNIPAQUE) 350 MG/ML injection 100 mL (100 mLs Intravenous Contrast Given 01/11/21 2156)    ED Course  I have reviewed the triage vital signs and the nursing notes.  Pertinent labs & imaging results that were available during my care of the patient were reviewed by me and considered in my medical decision making (see chart for details).  Clinical  Course as of 01/11/21 2236  Sun Jan 11, 2021  1938 Pulse Rate(!): 130 Pulse rate 104 on my exam. [SJ]  2122 Spoke with Dr. Havery Moros, [facility]. We  discussed the patient's symptoms, lab results, physical exam findings, and vital signs. Requests we obtain CTA abdomen/pelvis IV contrast only. If this shows an area of bleeding, contact IR. Admit via the hospitalist with GI continuing to consult. [SJ]  2235 Spoke with Dr. Jonelle Sidle, hospitalist. Agrees to admit the patient. [SJ]    Clinical Course User Index [SJ] Fortune Brannigan, Helane Gunther, PA-C   MDM Rules/Calculators/A&P                          Patient presents complaining of at least 3 episodes of bright red blood per rectum with continued oozing hemorrhage in between bowel movements. Complains of generalized weakness. Afebrile, nontoxic.  Initially tachycardic.  Also, although she is not hypotensive, her blood pressure is lower than previous instances.  She has had a hemoglobin drop of greater than 2 g since yesterday. Blood transfusion initiated. Patient will require admission for continued monitoring and management.  Findings and plan of care discussed with attending physician, Adrian Prows, MD. Dr. Vanita Panda personally evaluated and examined this patient.  Vitals:   01/11/21 2100 01/11/21 2115 01/11/21 2130 01/11/21 2145  BP: (!) 103/59 130/72 121/65 135/65  Pulse: 85 92 78 80  Resp: 17 20 18 13   Temp:      TempSrc:      SpO2: 98% 99% 100% 100%  Weight:      Height:         Final Clinical Impression(s) / ED Diagnoses Final diagnoses:  Bright red blood per rectum  Symptomatic anemia    Rx / DC Orders ED Discharge Orders    None       Layla Maw 01/11/21 2236    Carmin Muskrat, MD 01/13/21 [phone]

## 2021-01-11 NOTE — Telephone Encounter (Signed)
Patient's daughter called this evening. The patient was recently admitted for lower GI bleeding. CT abdomen and tagged RBC scan suggested a sigmoid diverticular bleed. IR embolization attempted however the patient had stopped bleeding at the time of that exam. Patient had a colonoscopy yesterday, sigmoid diverticulosis noted. A few polyps removed via cold snare and cecal AVM ablated with APC. NO active bleeding noted during her case. She was discharged yesterday PM.  Daughter states patient has had 2 large volume episode of painless hematochezia this evening, similar to prior episodes that brought ahher in before. No lightheadedness, chest pain, shortness of breath, etc. Family and patient are anxious about her rebleeding. I recommend that she go back to the hospital for labs and further evaluation and for likely admission. Following discussion they were agreeable and want her admitted to the hospital.   I spoke with charge nurse at [facility] about her case, they are expecting her. Will get basic labs, if she is actively bleeding in the ED would pursue CT angio to help localize again. I am available for questions about her case.

## 2021-01-11 NOTE — ED Triage Notes (Signed)
Pt came in with c/o GI Bleeding. Pt states she had a colonoscopy yesterday. Pt states she has had three episodes of bleeding today. Pt states it is bright red blood.

## 2021-01-12 ENCOUNTER — Inpatient Hospital Stay (HOSPITAL_COMMUNITY): Payer: Medicare Other

## 2021-01-12 DIAGNOSIS — E785 Hyperlipidemia, unspecified: Secondary | ICD-10-CM

## 2021-01-12 DIAGNOSIS — E7849 Other hyperlipidemia: Secondary | ICD-10-CM | POA: Diagnosis not present

## 2021-01-12 DIAGNOSIS — K921 Melena: Secondary | ICD-10-CM

## 2021-01-12 DIAGNOSIS — J449 Chronic obstructive pulmonary disease, unspecified: Secondary | ICD-10-CM

## 2021-01-12 DIAGNOSIS — D62 Acute posthemorrhagic anemia: Secondary | ICD-10-CM | POA: Diagnosis not present

## 2021-01-12 DIAGNOSIS — K922 Gastrointestinal hemorrhage, unspecified: Secondary | ICD-10-CM

## 2021-01-12 DIAGNOSIS — K5731 Diverticulosis of large intestine without perforation or abscess with bleeding: Secondary | ICD-10-CM

## 2021-01-12 DIAGNOSIS — I10 Essential (primary) hypertension: Secondary | ICD-10-CM

## 2021-01-12 DIAGNOSIS — K5520 Angiodysplasia of colon without hemorrhage: Secondary | ICD-10-CM | POA: Diagnosis not present

## 2021-01-12 DIAGNOSIS — K625 Hemorrhage of anus and rectum: Secondary | ICD-10-CM | POA: Diagnosis not present

## 2021-01-12 HISTORY — PX: IR ANGIOGRAM SELECTIVE EACH ADDITIONAL VESSEL: IMG667

## 2021-01-12 HISTORY — PX: IR EMBO ARTERIAL NOT HEMORR HEMANG INC GUIDE ROADMAPPING: IMG5448

## 2021-01-12 HISTORY — PX: IR ANGIOGRAM VISCERAL SELECTIVE: IMG657

## 2021-01-12 HISTORY — PX: IR US GUIDE VASC ACCESS RIGHT: IMG2390

## 2021-01-12 LAB — TYPE AND SCREEN
ABO/RH(D): B POS
Antibody Screen: NEGATIVE
Unit division: 0
Unit division: 0
Unit division: 0
Unit division: 0

## 2021-01-12 LAB — BASIC METABOLIC PANEL
Anion gap: 9 (ref 5–15)
BUN: 16 mg/dL (ref 8–23)
CO2: 23 mmol/L (ref 22–32)
Calcium: 7.3 mg/dL — ABNORMAL LOW (ref 8.9–10.3)
Chloride: 110 mmol/L (ref 98–111)
Creatinine, Ser: 0.59 mg/dL (ref 0.44–1.00)
GFR, Estimated: >60 mL/min (ref >60)
Glucose, Bld: 107 mg/dL — ABNORMAL HIGH (ref 70–99)
Potassium: 3.3 mmol/L — ABNORMAL LOW (ref 3.5–5.1)
Sodium: 142 mmol/L (ref 135–145)

## 2021-01-12 LAB — CBC
HCT: 22.9 % — ABNORMAL LOW (ref 36.0–46.0)
HCT: 22.9 % — ABNORMAL LOW (ref 36.0–46.0)
HCT: 21.6 % — ABNORMAL LOW (ref 36.0–46.0)
Hemoglobin: 7.7 g/dL — ABNORMAL LOW (ref 12.0–15.0)
Hemoglobin: 7.9 g/dL — ABNORMAL LOW (ref 12.0–15.0)
Hemoglobin: 7.3 g/dL — ABNORMAL LOW (ref 12.0–15.0)
MCH: 31.2 pg (ref 26.0–34.0)
MCH: 32.9 pg (ref 26.0–34.0)
MCH: 32.2 pg (ref 26.0–34.0)
MCHC: 33.6 g/dL (ref 30.0–36.0)
MCHC: 34.5 g/dL (ref 30.0–36.0)
MCHC: 33.8 g/dL (ref 30.0–36.0)
MCV: 92.7 fL (ref 80.0–100.0)
MCV: 95.4 fL (ref 80.0–100.0)
MCV: 95.2 fL (ref 80.0–100.0)
Platelets: 180 10*3/uL (ref 150–400)
Platelets: 203 10*3/uL (ref 150–400)
Platelets: 191 10*3/uL (ref 150–400)
RBC: 2.47 MIL/uL — ABNORMAL LOW (ref 3.87–5.11)
RBC: 2.4 MIL/uL — ABNORMAL LOW (ref 3.87–5.11)
RBC: 2.27 MIL/uL — ABNORMAL LOW (ref 3.87–5.11)
RDW: 17.2 % — ABNORMAL HIGH (ref 11.5–15.5)
RDW: 18.3 % — ABNORMAL HIGH (ref 11.5–15.5)
RDW: 18 % — ABNORMAL HIGH (ref 11.5–15.5)
WBC: 8.8 10*3/uL (ref 4.0–10.5)
WBC: 14.8 10*3/uL — ABNORMAL HIGH (ref 4.0–10.5)
WBC: 16 10*3/uL — ABNORMAL HIGH (ref 4.0–10.5)
nRBC: 0 % (ref 0.0–0.2)
nRBC: 0 % (ref 0.0–0.2)
nRBC: 0 % (ref 0.0–0.2)

## 2021-01-12 LAB — PREPARE RBC (CROSSMATCH)

## 2021-01-12 LAB — BPAM RBC
Blood Product Expiration Date: 202203162359
Blood Product Expiration Date: 202203222359
Blood Product Expiration Date: 202203222359
Blood Product Expiration Date: 202203232359
ISSUE DATE / TIME: 202202250035
ISSUE DATE / TIME: 202202250330
Unit Type and Rh: 7300
Unit Type and Rh: 7300
Unit Type and Rh: 7300
Unit Type and Rh: 7300

## 2021-01-12 LAB — MRSA PCR SCREENING: MRSA by PCR: NEGATIVE

## 2021-01-12 MED ORDER — MIDAZOLAM HCL 2 MG/2ML IJ SOLN
INTRAMUSCULAR | Status: AC | PRN
  Administered 2021-01-12 (×2): 0.5 mg via INTRAVENOUS

## 2021-01-12 MED ORDER — PRAVASTATIN SODIUM 20 MG PO TABS
10.0000 mg | ORAL_TABLET | Freq: Every day | ORAL | Status: DC
  Administered 2021-01-12 – 2021-01-29 (×17): 10 mg via ORAL
  Filled 2021-01-12 (×17): qty 1

## 2021-01-12 MED ORDER — CHLORHEXIDINE GLUCONATE CLOTH 2 % EX PADS
6.0000 | MEDICATED_PAD | Freq: Every day | CUTANEOUS | Status: DC
  Administered 2021-01-12 – 2021-01-28 (×15): 6 via TOPICAL

## 2021-01-12 MED ORDER — FERROUS SULFATE 325 (65 FE) MG PO TABS
325.0000 mg | ORAL_TABLET | Freq: Two times a day (BID) | ORAL | Status: DC
  Administered 2021-01-12 – 2021-01-14 (×6): 325 mg via ORAL
  Filled 2021-01-12 (×6): qty 1

## 2021-01-12 MED ORDER — IOHEXOL 300 MG/ML  SOLN
100.0000 mL | Freq: Once | INTRAMUSCULAR | Status: AC | PRN
  Administered 2021-01-12: 15 mL via INTRA_ARTERIAL

## 2021-01-12 MED ORDER — POTASSIUM CHLORIDE CRYS ER 20 MEQ PO TBCR
40.0000 meq | EXTENDED_RELEASE_TABLET | Freq: Two times a day (BID) | ORAL | Status: AC
  Administered 2021-01-12 (×2): 40 meq via ORAL
  Filled 2021-01-12 (×2): qty 2

## 2021-01-12 MED ORDER — LIDOCAINE HCL 1 % IJ SOLN
INTRAMUSCULAR | Status: AC
  Filled 2021-01-12: qty 20

## 2021-01-12 MED ORDER — BUDESONIDE 3 MG PO CPEP
6.0000 mg | ORAL_CAPSULE | Freq: Every day | ORAL | Status: DC
  Administered 2021-01-12 – 2021-01-29 (×17): 6 mg via ORAL
  Filled 2021-01-12 (×18): qty 2

## 2021-01-12 MED ORDER — ONDANSETRON HCL 4 MG/2ML IJ SOLN
4.0000 mg | Freq: Four times a day (QID) | INTRAMUSCULAR | Status: DC | PRN
  Administered 2021-01-13 – 2021-01-27 (×5): 4 mg via INTRAVENOUS
  Filled 2021-01-12 (×6): qty 2

## 2021-01-12 MED ORDER — LACTATED RINGERS IV SOLN: INTRAVENOUS | Status: DC

## 2021-01-12 MED ORDER — FENTANYL CITRATE (PF) 100 MCG/2ML IJ SOLN
INTRAMUSCULAR | Status: AC
  Filled 2021-01-12: qty 2

## 2021-01-12 MED ORDER — IOHEXOL 300 MG/ML  SOLN
100.0000 mL | Freq: Once | INTRAMUSCULAR | Status: AC | PRN
  Administered 2021-01-12: 10 mL via INTRA_ARTERIAL

## 2021-01-12 MED ORDER — IOHEXOL 300 MG/ML  SOLN
100.0000 mL | Freq: Once | INTRAMUSCULAR | Status: AC | PRN
  Administered 2021-01-12: 20 mL via INTRA_ARTERIAL

## 2021-01-12 MED ORDER — ONDANSETRON HCL 4 MG PO TABS
4.0000 mg | ORAL_TABLET | Freq: Four times a day (QID) | ORAL | Status: DC | PRN
  Filled 2021-01-12: qty 1

## 2021-01-12 MED ORDER — SODIUM CHLORIDE 0.9 % IV SOLN
100.0000 mg | Freq: Once | INTRAVENOUS | Status: AC
  Administered 2021-01-12: 100 mg via INTRAVENOUS
  Filled 2021-01-12: qty 5

## 2021-01-12 MED ORDER — PANTOPRAZOLE SODIUM 40 MG IV SOLR
40.0000 mg | Freq: Two times a day (BID) | INTRAVENOUS | Status: DC
  Administered 2021-01-12 – 2021-01-14 (×7): 40 mg via INTRAVENOUS
  Filled 2021-01-12 (×7): qty 40

## 2021-01-12 MED ORDER — LIDOCAINE HCL (PF) 1 % IJ SOLN
INTRAMUSCULAR | Status: AC | PRN
  Administered 2021-01-12: 5 mL

## 2021-01-12 MED ORDER — CALCIUM CARBONATE 1250 (500 CA) MG PO TABS
1250.0000 mg | ORAL_TABLET | Freq: Every day | ORAL | Status: DC
  Administered 2021-01-12 – 2021-01-29 (×17): 1250 mg via ORAL
  Filled 2021-01-12 (×17): qty 1

## 2021-01-12 MED ORDER — MIDAZOLAM HCL 2 MG/2ML IJ SOLN
INTRAMUSCULAR | Status: AC
  Filled 2021-01-12: qty 4

## 2021-01-12 NOTE — Sedation Documentation (Signed)
Fluids NS wide open due to low B/P 76/54. Unit of PRBCs called for.

## 2021-01-12 NOTE — Procedures (Signed)
Pre-procedure Diagnosis: Recurrent lower GI bleeding Post-procedure Diagnosis: Same  Post mesenteric arteriogram and percutaneous embolization of 2 distal arcades of the IMA supplying an ill defined area of active extravasation within the mid descending colon correlating with the findings on preceding CTA.    Complications: Small hematoma at right groin access site EBL: Trace  Keep right leg straight for 4 hrs (until 0730)  Signed: Sandi Mariscal Pager: [phone] 01/12/2021, 3:26 AM

## 2021-01-12 NOTE — Progress Notes (Signed)
GI UPDATE:  CT angio reviewed with Dr. Pascal Lux of IR and Dr. Jonelle Sidle of hospitalist service. CTA done in the ED and active bleeding at the same site noted on prior CT and tagged RBC scan a few days ago. Colonoscopy yesterday showed diverticulosis in this area, no other pathology appreciated in that region or around the anastomosis. Polyps removed with cold snare were in the right colon, not in the area of concern on CT. The patient has developed hypotension and recurrent bleeding, awaiting RBC transfusion, discussed options. Very likely having recurrent diverticular bleed of the left colon. Dr. Pascal Lux is coming in to evaluate the patient for embolization attempt again tonight, hopefully if the patient has active bleeding on this exam it will be amenable to embolization. Call if further questions overnight pending her course. Our team will follow.  Jolly Mango, MD [facility]

## 2021-01-12 NOTE — TOC Initial Note (Signed)
Transition of Care [facility]) - Initial/Assessment Note    Patient Details  Name: Kathleen Pope MRN: [id number] Date of Birth: 19-Jan-1933  Transition of Care [facility]) CM/SW Contact:    Leeroy Cha, RN Phone Number: 01/12/2021, 8:37 AM  Clinical Narrative:                  87 y.o. female with medical history significant of recurrent rectal bleed due to AVMs and polyps, COPD, hypertension, hyperlipidemia, diverticulosis who was just discharged from the hospital yesterday secondary to admission for GI bleed.  During that hospitalization she had a bleeding scan done that was positive with mesenteric CT and she was embolized.  This was done on February 24.  She also had colonoscopy on 226 that showed multiple small polyps.  Patient went home on 1 day later was having massive rectal bleed.  She is still noted to be bleeding in the ER.  GI consulted.  Patient initiated on IV Protonix and recommendation was to repeat repeated CT angiogram.  This is now positive for active bleeding at the same site from previous.  Patient being readmitted to the hospital for further evaluation and treatment..  ED Course: Temperature is 98.2 blood pressure 100/64 dropping to 80s momentarily pulse 130 respirate 20 oxygen sat 98% on room air.  White count 8.4 hemoglobin has dropped to 8.7 from 10.1 overnight platelets 224.  Sodium 143 potassium 3.6 chloride 106 CO2 27 BUN 16 creatinine 0.85 calcium 8.4.  Glucose is 200.  CT angiogram of the abdomen and pelvis showed active bleeding in the mesenteric site where patient recently had embolization.  Interventional radiology has been paged and patient will be admitted for further evaluation and treatment 022822/hgb 7.7 s/p one unit of prbc, for embolization this am, Following for progression and toc needs Expected Discharge Plan: Home/Self Care Barriers to Discharge: Continued Medical Work up   Patient Goals and CMS Choice Patient states their goals for this hospitalization and  ongoing recovery are:: to go home CMS Medicare.gov Compare Post Acute Care list provided to:: Patient Choice offered to / list presented to : Patient  Expected Discharge Plan and Services Expected Discharge Plan: Home/Self Care   Discharge Planning Services: CM Consult   Living arrangements for the past 2 months: Single Family Home                                      Prior Living Arrangements/Services Living arrangements for the past 2 months: Single Family Home Lives with:: Self Patient language and need for interpreter reviewed:: Yes Do you feel safe going back to the place where you live?: Yes      Need for Family Participation in Patient Care: No (Comment) Care giver support system in place?: No (comment)   Criminal Activity/Legal Involvement Pertinent to Current Situation/Hospitalization: No - Comment as needed  Activities of Daily Living      Permission Sought/Granted                  Emotional Assessment Appearance:: Appears stated age Attitude/Demeanor/Rapport: Engaged Affect (typically observed): Calm Orientation: : Oriented to Self,Oriented to Place,Oriented to  Time,Oriented to Situation Alcohol / Substance Use: Not Applicable Psych Involvement: No (comment)  Admission diagnosis:  Bright red blood per rectum [K62.5] Gastrointestinal bleed [K92.2] GI bleed [K92.2] Rectal bleed [K62.5] Symptomatic anemia [D64.9] Patient Active Problem List   Diagnosis Date Noted  .  Rectal bleed 01/11/2021  . Benign neoplasm of cecum   . Benign neoplasm of ascending colon   . Abnormal CT of the abdomen   . GI bleed 01/08/2021  . Pleural effusion on right 11/27/2020  . Loose stools 04/06/2017  . De Quervain's disease (tenosynovitis) 03/14/2014  . Rectal bleeding 03/09/2013  . Hematochezia 03/09/2013  . External hemorrhoids 03/08/2013  . Chronic lower GI bleeding 08/18/2012  . GI hemorrhage from post-treatment cecal ulcer 08/18/2012  . AVM (arteriovenous  malformation) of colon 08/18/2012  . HTN (hypertension) 08/18/2012  . Acute blood loss anemia 08/18/2012  . Bruit 08/04/2012  . Mixed hyperlipidemia 08/04/2012  . Cigarette smoker 08/04/2012  . Murmur 08/04/2012   PCP:  Irene Shipper, MD Pharmacy:   [facility], [address] [place] [address] Phone: [phone] Fax: [phone]     Social Determinants of Health (SDOH) Interventions    Readmission Risk Interventions No flowsheet data found.

## 2021-01-12 NOTE — Progress Notes (Signed)
Patient arrives to room 1527 from ICU via wheelchair.  Patient independent from wheelchair to recliner with steady gait. Patient educated on callbell use and bed controls with stated understanding

## 2021-01-12 NOTE — Progress Notes (Signed)
PROGRESS NOTE    [name]  XVQ:[id number] DOB: 1933/04/05 DOA: 01/11/2021 PCP: Irene Shipper, MD   Brief Narrative:  HPI per Dr. Gala Romney on 01/11/21 Kathleen Pope is a 87 y.o. female with medical history significant of recurrent rectal bleed due to AVMs and polyps, COPD, hypertension, hyperlipidemia, diverticulosis who was just discharged from the hospital yesterday secondary to admission for GI bleed.  During that hospitalization she had a bleeding scan done that was positive with mesenteric CT and she was embolized.  This was done on February 24.  She also had colonoscopy on 226 that showed multiple small polyps.  Patient went home on 1 day later was having massive rectal bleed.  She is still noted to be bleeding in the ER.  GI consulted.  Patient initiated on IV Protonix and recommendation was to repeat repeated CT angiogram.  This is now positive for active bleeding at the same site from previous.  Patient being readmitted to the hospital for further evaluation and treatment..  ED Course: Temperature is 98.2 blood pressure 100/64 dropping to 80s momentarily pulse 130 respirate 20 oxygen sat 98% on room air.  White count 8.4 hemoglobin has dropped to 8.7 from 10.1 overnight platelets 224.  Sodium 143 potassium 3.6 chloride 106 CO2 27 BUN 16 creatinine 0.85 calcium 8.4.  Glucose is 200.  CT angiogram of the abdomen and pelvis showed active bleeding in the mesenteric site where patient recently had embolization.  Interventional radiology has been paged and patient will be admitted for further evaluation and treatment  **Interim History Patient had an embolization yesterday of the 2 distal arcades of the IMA supplying an ill defined area of the active extravasation within the mid descending colon correlating with the findings to the CTA.  She is appears to be improved now and continues on LR.  GI following and recommending clear liquid diet and transfusion for hemoglobin less than 7.  Will need  PT OT to further evaluate and treat.  Patient is doing better and will transferred her to medical for now.  Assessment & Plan:   Principal Problem:   Rectal bleed Active Problems:   Chronic lower GI bleeding   AVM (arteriovenous malformation) of colon   HTN (hypertension)   Acute blood loss anemia   GI bleed  Recurrent GI Bleeding  With Rectal Bleeding  -Recently was admitted and discharged on 01/10/21 -Hgb/Hct went from 10.9/32.2 -> 8.7/25.7 -> 7.7/22.9 and was typed and screend and transfused 1 unit of pRBC's; Repeating CBC q6h x 4  -Patient has active bleeding based on repeat CT angiogram. CTA showed "Mid to distal descending colon gastrointestinal hemorrhage that is located proximal to the colonic anastomosis." -She was started on Pantoprazole IV 40 mg q12h and will continue  -Given Budesonide 6 mg po Daily  -Was NPO for Emobolization and now on Clear Liquid Diet  -Patient is s/p "Post mesenteric arteriogram and percutaneous embolization of 2 distal arcades of the IMA supplying an ill defined area of active extravasation within the mid descending colon correlating with the findings on preceding CTA." -Contininuing LR at 50 mL/hr -C/w Ferrous Sulfate 325 mg po BID -C/w Supportive Care and appreciate GI and IR assistance and management  -GI hoping that the embolization yesterday was successful and the hemoglobin is stabilized with minimal further bloody bowel movements.  They are recommending observing the patient for the least the next 24 to 48 hours -She is medically improved and does not need to be  in the intensive care unit so we will transfer to the telemetry floor. -GI recommending continue to monitor hemoglobin with transfusion of less than 7 -Will get PT/OT to evaluate and Treat  Acute Blood Loss Anemia -Patient was awaiting transfusion of packed red blood cells and was only transfused 1 unit PRBCs -Hgb/Hct went from 10.9/32.2 -> 8.7/25.7 -> 7.7/22.9 and was typed and screend  and transfused 1 unit of pRBC's; Repeating CBC q6h x 4 -Transfusion threshold less than 7 -Continue to monitor for signs and symptoms of bleeding; currently no overt bleeding noted -Repeat CBC as above and in the a.m.  Mild AOS -Per echo 201 -Has appointment for outpatient echo in the next 2 to 3 weeks -Had her Lasix held on discharge on 01/10/21 -Holding Losartan given Hypotension   Hypokalemia -Patient's K+ was 3.3 this AM -Check Mag Level -Replete with po KCl 40 mEQ BID x2  -Continue to Monitor and Replete as Necessary -Repeat CMP in the AM   Hyperglycemia -Patient's Blood Sugar on Admission was 200 and repeat this AM on BMP was 107 -Check HbA1c in the AM -Continue to Monitor Blood Sugars Carefeully; If necessary will place on Sensitive Novolog SSI  COPD  -presumed-former smoker -Asymptomatic and currently not in Acute Exacerbation -If necessary will add Nebs   HTN but was Hypotensive due to GIB as above -Holding meds of Losartan 50 mg po daily and Furosemide  -Continue to Monitor BP per Protocol -Last BP is now 147/70  HLD -C/w Pravastatin 10 mg po Daily   GOC: DNR, poA  DVT prophylaxis: SCDs given her Bleeding  Code Status: DO NOT RESUSCITATE  Family Communication: No family present at bedside  Disposition Plan: Transfer to Telemetry and will need evaluation by PT/OT and Clearance by GI for Safe Discharge Disposition.  Status is: Inpatient  Remains inpatient appropriate because:Unsafe d/c plan, IV treatments appropriate due to intensity of illness or inability to take PO and Inpatient level of care appropriate due to severity of illness   Dispo: The patient is from: Home              Anticipated d/c is to: TBD              Patient currently is not medically stable to d/c.   Difficult to place patient No  Consultants:   Gastroenterology  Interventional Radiology    Procedures:  Post mesenteric arteriogram and percutaneous embolization of 2 distal  arcades of the IMA supplying an ill defined area of active extravasation within the mid descending colon correlating with the findings on preceding CTA.   Antimicrobials: Anti-infectives (From admission, onward)   None        Subjective: Seen and examined at bedside and states that she is feeling much better today than she was yesterday.  No nausea or vomiting.  States that she actually feels okay now.  No chest pain or shortness breath.  Has not had any bowel movements after her procedure.  No other concerns or complaints at this time.  Objective: Vitals:   01/12/21 0600 01/12/21 0700 01/12/21 0800 01/12/21 0900  BP: 133/60  137/64 (!) 147/70  Pulse: 89 92 91 93  Resp: 12 18 18 19   Temp:   98.2 F (36.8 C)   TempSrc:   Oral   SpO2: 99%  100% 100%  Weight:      Height:        Intake/Output Summary (Last 24 hours) at 01/12/2021 1039 Last data filed at  01/12/2021 0900 Gross per 24 hour  Intake 998.24 ml  Output -  Net 998.24 ml   Filed Weights   01/11/21 1908  Weight: 39.9 kg   Examination: Physical Exam:  Constitutional: The patient is a thin elderly Caucasian female currently in NAD and appears calm and comfortable Eyes: Lids and conjunctivae normal, sclerae anicteric  ENMT: External Ears, Nose appear normal. Grossly normal hearing. Neck: Appears normal, supple, no cervical masses, normal ROM, no appreciable thyromegaly; no JVD Respiratory: Diminished to auscultation bilaterally, no wheezing, rales, rhonchi or crackles. Normal respiratory effort and patient is not tachypenic. No accessory muscle use.  Unlabored breathing Cardiovascular: RRR, no murmurs / rubs / gallops. S1 and S2 auscultated.  Abdomen: Soft, mildly tender, non-distended. Bowel sounds positive.  GU: Deferred. Musculoskeletal: No clubbing / cyanosis of digits/nails. No joint deformity upper and lower extremities.  Skin: No rashes, lesions, ulcers on limited skin evaluation. No induration; Warm and dry.   Neurologic: CN 2-12 grossly intact with no focal deficits. Romberg sign and cerebellar reflexes not assessed.  Psychiatric: Normal judgment and insight. Alert and oriented x 3. Normal mood and appropriate affect.   Data Reviewed: I have personally reviewed following labs and imaging studies  CBC: Recent Labs  Lab 01/08/21 1140 01/08/21 1542 01/09/21 0815 01/09/21 1437 01/09/21 2149 01/10/21 0305 01/10/21 0829 01/11/21 2015 01/12/21 0700  WBC 10.1  --  8.3  --   --  7.1  --  8.4 8.8  NEUTROABS  --   --   --   --   --   --   --  6.2  --   HGB 12.3   < > 11.0*  11.1*   < > 11.6* 10.1*  10.2* 10.9* 8.7* 7.7*  HCT 36.1   < > 32.9*  32.9*   < > 33.2* 29.8*  30.1* 32.2* 25.7* 22.9*  MCV 98.9  --  93.5  --   --  93.8  --  97.3 92.7  PLT 304  --  198  --   --  189  --  225 180   < > = values in this interval not displayed.   Basic Metabolic Panel: Recent Labs  Lab 01/08/21 1140 01/09/21 0815 01/10/21 0305 01/11/21 2015 01/12/21 0700  NA 142 140 142 143 142  K 4.1 5.1 3.6 3.6 3.3*  CL 104 105 111 106 110  CO2 27 27 25 27 23   GLUCOSE 154* 119* 117* 200* 107*  BUN 14 16 9 16 16   CREATININE 0.74 0.81 0.72 0.85 0.59  CALCIUM 10.1 8.3* 8.2* 8.4* 7.3*   GFR: Estimated Creatinine Clearance: 32.4 mL/min (by C-G formula based on SCr of 0.59 mg/dL). Liver Function Tests: Recent Labs  Lab 01/08/21 1140 01/11/21 2015  AST 23 19  ALT 17 15  ALKPHOS 57 43  BILITOT 0.7 0.4  PROT 7.2 5.4*  ALBUMIN 4.0 3.0*   No results for input(s): LIPASE, AMYLASE in the last 168 hours. No results for input(s): AMMONIA in the last 168 hours. Coagulation Profile: Recent Labs  Lab 01/08/21 1140  INR 0.9   Cardiac Enzymes: No results for input(s): CKTOTAL, CKMB, CKMBINDEX, TROPONINI in the last 168 hours. BNP (last 3 results) No results for input(s): PROBNP in the last 8760 hours. HbA1C: No results for input(s): HGBA1C in the last 72 hours. CBG: No results for input(s): GLUCAP in the  last 168 hours. Lipid Profile: No results for input(s): CHOL, HDL, LDLCALC, TRIG, CHOLHDL, LDLDIRECT in the last  72 hours. Thyroid Function Tests: No results for input(s): TSH, T4TOTAL, FREET4, T3FREE, THYROIDAB in the last 72 hours. Anemia Panel: No results for input(s): VITAMINB12, FOLATE, FERRITIN, TIBC, IRON, RETICCTPCT in the last 72 hours. Sepsis Labs: No results for input(s): PROCALCITON, LATICACIDVEN in the last 168 hours.  Recent Results (from the past 240 hour(s))  Resp Panel by RT-PCR (Flu A&B, Covid) Nasopharyngeal Swab     Status: None   Collection Time: 01/08/21  2:51 PM   Specimen: Nasopharyngeal Swab; Nasopharyngeal(NP) swabs in vial transport medium  Result Value Ref Range Status   SARS Coronavirus 2 by RT PCR NEGATIVE NEGATIVE Final    Comment: (NOTE) SARS-CoV-2 target nucleic acids are NOT DETECTED.  The SARS-CoV-2 RNA is generally detectable in upper respiratory specimens during the acute phase of infection. The lowest concentration of SARS-CoV-2 viral copies this assay can detect is 138 copies/mL. A negative result does not preclude SARS-Cov-2 infection and should not be used as the sole basis for treatment or other patient management decisions. A negative result may occur with  improper specimen collection/handling, submission of specimen other than nasopharyngeal swab, presence of viral mutation(s) within the areas targeted by this assay, and inadequate number of viral copies(<138 copies/mL). A negative result must be combined with clinical observations, patient history, and epidemiological information. The expected result is Negative.  Fact Sheet for Patients:  EntrepreneurPulse.com.au  Fact Sheet for Healthcare Providers:  IncredibleEmployment.be  This test is no t yet approved or cleared by the [place] FDA and  has been authorized for detection and/or diagnosis of SARS-CoV-2 by FDA under an Emergency Use  Authorization (EUA). This EUA will remain  in effect (meaning this test can be used) for the duration of the COVID-19 declaration under Section 564(b)(1) of the Act, 21 U.S.C.section 360bbb-3(b)(1), unless the authorization is terminated  or revoked sooner.       Influenza A by PCR NEGATIVE NEGATIVE Final   Influenza B by PCR NEGATIVE NEGATIVE Final    Comment: (NOTE) The Xpert Xpress SARS-CoV-2/FLU/RSV plus assay is intended as an aid in the diagnosis of influenza from Nasopharyngeal swab specimens and should not be used as a sole basis for treatment. Nasal washings and aspirates are unacceptable for Xpert Xpress SARS-CoV-2/FLU/RSV testing.  Fact Sheet for Patients: EntrepreneurPulse.com.au  Fact Sheet for Healthcare Providers: IncredibleEmployment.be  This test is not yet approved or cleared by the [place] FDA and has been authorized for detection and/or diagnosis of SARS-CoV-2 by FDA under an Emergency Use Authorization (EUA). This EUA will remain in effect (meaning this test can be used) for the duration of the COVID-19 declaration under Section 564(b)(1) of the Act, 21 U.S.C. section 360bbb-3(b)(1), unless the authorization is terminated or revoked.  Performed at [facility], [place] [address] [place], [place] [address]   Resp Panel by RT-PCR (Flu A&B, Covid) Nasopharyngeal Swab     Status: None   Collection Time: 01/11/21  7:53 PM   Specimen: Nasopharyngeal Swab; Nasopharyngeal(NP) swabs in vial transport medium  Result Value Ref Range Status   SARS Coronavirus 2 by RT PCR NEGATIVE NEGATIVE Final    Comment: (NOTE) SARS-CoV-2 target nucleic acids are NOT DETECTED.  The SARS-CoV-2 RNA is generally detectable in upper respiratory specimens during the acute phase of infection. The lowest concentration of SARS-CoV-2 viral copies this assay can detect is 138 copies/mL. A negative result does not preclude  SARS-Cov-2 infection and should not be used as the sole basis for treatment or other  patient management decisions. A negative result may occur with  improper specimen collection/handling, submission of specimen other than nasopharyngeal swab, presence of viral mutation(s) within the areas targeted by this assay, and inadequate number of viral copies(<138 copies/mL). A negative result must be combined with clinical observations, patient history, and epidemiological information. The expected result is Negative.  Fact Sheet for Patients:  EntrepreneurPulse.com.au  Fact Sheet for Healthcare Providers:  IncredibleEmployment.be  This test is no t yet approved or cleared by the [place] FDA and  has been authorized for detection and/or diagnosis of SARS-CoV-2 by FDA under an Emergency Use Authorization (EUA). This EUA will remain  in effect (meaning this test can be used) for the duration of the COVID-19 declaration under Section 564(b)(1) of the Act, 21 U.S.C.section 360bbb-3(b)(1), unless the authorization is terminated  or revoked sooner.       Influenza A by PCR NEGATIVE NEGATIVE Final   Influenza B by PCR NEGATIVE NEGATIVE Final    Comment: (NOTE) The Xpert Xpress SARS-CoV-2/FLU/RSV plus assay is intended as an aid in the diagnosis of influenza from Nasopharyngeal swab specimens and should not be used as a sole basis for treatment. Nasal washings and aspirates are unacceptable for Xpert Xpress SARS-CoV-2/FLU/RSV testing.  Fact Sheet for Patients: EntrepreneurPulse.com.au  Fact Sheet for Healthcare Providers: IncredibleEmployment.be  This test is not yet approved or cleared by the [place] FDA and has been authorized for detection and/or diagnosis of SARS-CoV-2 by FDA under an Emergency Use Authorization (EUA). This EUA will remain in effect (meaning this test can be used) for the duration of  the COVID-19 declaration under Section 564(b)(1) of the Act, 21 U.S.C. section 360bbb-3(b)(1), unless the authorization is terminated or revoked.  Performed at [facility], [place] [address] [place], [place] [address]   MRSA PCR Screening     Status: None   Collection Time: 01/12/21  1:47 AM   Specimen: Nasopharyngeal  Result Value Ref Range Status   MRSA by PCR NEGATIVE NEGATIVE Final    Comment:        The GeneXpert MRSA Assay (FDA approved for NASAL specimens only), is one component of a comprehensive MRSA colonization surveillance program. It is not intended to diagnose MRSA infection nor to guide or monitor treatment for MRSA infections. Performed at [facility], [place] [address] [place], [place] [address]      RN Pressure Injury Documentation:     Estimated body mass index is 16.36 kg/m as calculated from the following:   Height as of this encounter: 5' 1.5" (1.562 m).   Weight as of this encounter: 39.9 kg.  Malnutrition Type:   Malnutrition Characteristics:   Nutrition Interventions:    Radiology Studies: IR Angiogram Visceral Selective  Result Date: 01/12/2021 INDICATION: Recurrent lower GI bleeding. Positive CTA. Please perform mesenteric arteriogram with potential embolization. EXAM: 1. ULTRASOUND GUIDANCE FOR ARTERIAL ACCESS 2. SELECTIVE INFERIOR MESENTERIC ARTERIOGRAM 3. SUB SELECTIVE LEFT COLIC ARTERIOGRAM 4. SUB SELECTIVE DISTAL ARCADE DIVISIONS OF THE LEFT COLIC ARTERY AND PERCUTANEOUS COIL EMBOLIZATION COMPARISON:  CTA abdomen and pelvis - 01/11/2021 Mesenteric arteriogram - 01/09/2021 Nuclear medicine tagged red blood cell bleeding scan - 01/08/2021 CT abdomen and pelvis - 01/08/2021 MEDICATIONS: None ANESTHESIA/SEDATION: Moderate (conscious) sedation was employed during this procedure. A total of Versed 0.5 mg was administered intravenously. Moderate Sedation Time: 60 minutes. The patient's level of consciousness and  vital signs were monitored continuously by radiology nursing throughout the procedure under my direct supervision. CONTRAST:  45 cc Omnipaque 300 FLUOROSCOPY TIME:  19 minutes (308 mGy) COMPLICATIONS: SIR Level A - No therapy, no consequence. Procedure complicated by development of a small hematoma at the right groin access site. PROCEDURE: Informed consent was obtained from the patient following explanation of the procedure, risks, benefits and alternatives. All questions were addressed. A time out was performed prior to the initiation of the procedure. Maximal barrier sterile technique utilized including caps, mask, sterile gowns, sterile gloves, large sterile drape, hand hygiene, and Betadine prep. The right femoral head was marked fluoroscopically. Under sterile conditions and local anesthesia, the right common femoral artery access was performed with a micropuncture needle. Under direct ultrasound guidance, the right common femoral was accessed with a micropuncture kit. An ultrasound image was saved for documentation purposes. This allowed for placement of a 5-French vascular sheath. A limited arteriogram was performed through the side arm of the sheath confirming appropriate access within the right common femoral artery. Over a Bentson wire, a Mickelson catheter was advanced the caudal aspect of the thoracic aorta where was reformed, back bled and flushed. The Mickelson catheter was then utilized to select the inferior mesenteric artery and a selective superior mesenteric arteriogram was performed. With the use of a fathom 14 microwire, a regular Renegade microcatheter was advanced to the level of the left colic artery and a selective left colic arteriogram was performed. The microcatheter was then utilized to select the inferior division of the left colic artery and a sub selective arteriogram was performed. The microcatheter was then advanced to select the distal arcade supplying the ill-defined area of  extravasation involving the mid descending colon. Sub selective injection confirmed appropriate positioning and the vessel was percutaneously coil embolized with two overlapping 2 mm diameter interlock coils. The microcatheter was retracted to the level the left colic artery and a post embolization left colic arteriogram was performed. The microcatheter was then advanced to select the distal arcade felt to potentially be supplying additional collateral supply to the ill-defined area of active extravasation involving the mid descending colon. Sub selective injection confirmed appropriate positioning and the vessel was percutaneously coil embolized with two overlapping 2 mm diameter interlock coils. The microcatheter was then retracted to the to the level pelvic artery and a selective left colic arteriogram was performed. Microcatheter was removed and a post embolization arteriogram was performed at the level of the IMA Images were reviewed and the procedure was terminated. All wires, catheters and sheaths were removed from the patient. Hemostasis was achieved at the right groin access site with manual compression. Procedure was complicated by development of a small hematoma at the right groin access site. The patient otherwise tolerated the procedure well without immediate post procedural complication. FINDINGS: Selective inferior mesenteric arteriogram demonstrates conventional branching pattern with ill-defined area of extravasation involving the mid descending colon compatible with findings on preceding CTA. Selective left colic arteriogram redemonstrates the ill-defined area of extravasation within the mid descending colon supplied predominantly via the inferior division of the left colic artery with potential collateral supply from the superior division. Sub selective arteriogram of the inferior division the left colic artery confirms supply to the ill-defined area of extravasation and as such the supplying  distal arcade was percutaneously coil embolized. Sub selective arteriogram of the superior division of the left colic artery suggests additional collateral supply to the ill-defined area of extravasation and as such the supplying distal arcade was percutaneously coil embolized. Completion left colic and inferior mesenteric arteriograms are negative for residual  areas of extravasation or collateral supply to the mid descending colon. Note, superior mesenteric arteriogram was not performed given exhaustive evaluation on dedicated mesenteric arteriogram performed 3 days prior (01/09/2021). IMPRESSION: Technically successful percutaneous coil embolization of distal arcades arising from both the superior and inferior divisions of the left colic artery supplying the ill-defined area of intraluminal contrast extravasation involving the mid descending colon compatible with the findings seen on preceding CTA. PLAN: - The patient is to remain flat for 4 hours with right leg straight (until at least 0730). - The patient will continue to experience several additional bloody bowel movements and may continue to require additional resuscitation (as the patient was bleeding both before and during the procedure), however ultimately I am hopeful she will stabilize in the coming days. Electronically Signed   By: Sandi Mariscal M.D.   On: 01/12/2021 07:24   IR Angiogram Selective Each Additional Vessel  Result Date: 01/12/2021 INDICATION: Recurrent lower GI bleeding. Positive CTA. Please perform mesenteric arteriogram with potential embolization. EXAM: 1. ULTRASOUND GUIDANCE FOR ARTERIAL ACCESS 2. SELECTIVE INFERIOR MESENTERIC ARTERIOGRAM 3. SUB SELECTIVE LEFT COLIC ARTERIOGRAM 4. SUB SELECTIVE DISTAL ARCADE DIVISIONS OF THE LEFT COLIC ARTERY AND PERCUTANEOUS COIL EMBOLIZATION COMPARISON:  CTA abdomen and pelvis - 01/11/2021 Mesenteric arteriogram - 01/09/2021 Nuclear medicine tagged red blood cell bleeding scan - 01/08/2021 CT  abdomen and pelvis - 01/08/2021 MEDICATIONS: None ANESTHESIA/SEDATION: Moderate (conscious) sedation was employed during this procedure. A total of Versed 0.5 mg was administered intravenously. Moderate Sedation Time: 60 minutes. The patient's level of consciousness and vital signs were monitored continuously by radiology nursing throughout the procedure under my direct supervision. CONTRAST:  45 cc Omnipaque 300 FLUOROSCOPY TIME:  19 minutes (630 mGy) COMPLICATIONS: SIR Level A - No therapy, no consequence. Procedure complicated by development of a small hematoma at the right groin access site. PROCEDURE: Informed consent was obtained from the patient following explanation of the procedure, risks, benefits and alternatives. All questions were addressed. A time out was performed prior to the initiation of the procedure. Maximal barrier sterile technique utilized including caps, mask, sterile gowns, sterile gloves, large sterile drape, hand hygiene, and Betadine prep. The right femoral head was marked fluoroscopically. Under sterile conditions and local anesthesia, the right common femoral artery access was performed with a micropuncture needle. Under direct ultrasound guidance, the right common femoral was accessed with a micropuncture kit. An ultrasound image was saved for documentation purposes. This allowed for placement of a 5-French vascular sheath. A limited arteriogram was performed through the side arm of the sheath confirming appropriate access within the right common femoral artery. Over a Bentson wire, a Mickelson catheter was advanced the caudal aspect of the thoracic aorta where was reformed, back bled and flushed. The Mickelson catheter was then utilized to select the inferior mesenteric artery and a selective superior mesenteric arteriogram was performed. With the use of a fathom 14 microwire, a regular Renegade microcatheter was advanced to the level of the left colic artery and a selective left  colic arteriogram was performed. The microcatheter was then utilized to select the inferior division of the left colic artery and a sub selective arteriogram was performed. The microcatheter was then advanced to select the distal arcade supplying the ill-defined area of extravasation involving the mid descending colon. Sub selective injection confirmed appropriate positioning and the vessel was percutaneously coil embolized with two overlapping 2 mm diameter interlock coils. The microcatheter was retracted to the level the left colic artery and a post  embolization left colic arteriogram was performed. The microcatheter was then advanced to select the distal arcade felt to potentially be supplying additional collateral supply to the ill-defined area of active extravasation involving the mid descending colon. Sub selective injection confirmed appropriate positioning and the vessel was percutaneously coil embolized with two overlapping 2 mm diameter interlock coils. The microcatheter was then retracted to the to the level pelvic artery and a selective left colic arteriogram was performed. Microcatheter was removed and a post embolization arteriogram was performed at the level of the IMA Images were reviewed and the procedure was terminated. All wires, catheters and sheaths were removed from the patient. Hemostasis was achieved at the right groin access site with manual compression. Procedure was complicated by development of a small hematoma at the right groin access site. The patient otherwise tolerated the procedure well without immediate post procedural complication. FINDINGS: Selective inferior mesenteric arteriogram demonstrates conventional branching pattern with ill-defined area of extravasation involving the mid descending colon compatible with findings on preceding CTA. Selective left colic arteriogram redemonstrates the ill-defined area of extravasation within the mid descending colon supplied predominantly  via the inferior division of the left colic artery with potential collateral supply from the superior division. Sub selective arteriogram of the inferior division the left colic artery confirms supply to the ill-defined area of extravasation and as such the supplying distal arcade was percutaneously coil embolized. Sub selective arteriogram of the superior division of the left colic artery suggests additional collateral supply to the ill-defined area of extravasation and as such the supplying distal arcade was percutaneously coil embolized. Completion left colic and inferior mesenteric arteriograms are negative for residual areas of extravasation or collateral supply to the mid descending colon. Note, superior mesenteric arteriogram was not performed given exhaustive evaluation on dedicated mesenteric arteriogram performed 3 days prior (01/09/2021). IMPRESSION: Technically successful percutaneous coil embolization of distal arcades arising from both the superior and inferior divisions of the left colic artery supplying the ill-defined area of intraluminal contrast extravasation involving the mid descending colon compatible with the findings seen on preceding CTA. PLAN: - The patient is to remain flat for 4 hours with right leg straight (until at least 0730). - The patient will continue to experience several additional bloody bowel movements and may continue to require additional resuscitation (as the patient was bleeding both before and during the procedure), however ultimately I am hopeful she will stabilize in the coming days. Electronically Signed   By: Sandi Mariscal M.D.   On: 01/12/2021 07:24   IR Angiogram Selective Each Additional Vessel  Result Date: 01/12/2021 INDICATION: Recurrent lower GI bleeding. Positive CTA. Please perform mesenteric arteriogram with potential embolization. EXAM: 1. ULTRASOUND GUIDANCE FOR ARTERIAL ACCESS 2. SELECTIVE INFERIOR MESENTERIC ARTERIOGRAM 3. SUB SELECTIVE LEFT COLIC  ARTERIOGRAM 4. SUB SELECTIVE DISTAL ARCADE DIVISIONS OF THE LEFT COLIC ARTERY AND PERCUTANEOUS COIL EMBOLIZATION COMPARISON:  CTA abdomen and pelvis - 01/11/2021 Mesenteric arteriogram - 01/09/2021 Nuclear medicine tagged red blood cell bleeding scan - 01/08/2021 CT abdomen and pelvis - 01/08/2021 MEDICATIONS: None ANESTHESIA/SEDATION: Moderate (conscious) sedation was employed during this procedure. A total of Versed 0.5 mg was administered intravenously. Moderate Sedation Time: 60 minutes. The patient's level of consciousness and vital signs were monitored continuously by radiology nursing throughout the procedure under my direct supervision. CONTRAST:  45 cc Omnipaque 300 FLUOROSCOPY TIME:  19 minutes (063 mGy) COMPLICATIONS: SIR Level A - No therapy, no consequence. Procedure complicated by development of a small hematoma at the right groin  access site. PROCEDURE: Informed consent was obtained from the patient following explanation of the procedure, risks, benefits and alternatives. All questions were addressed. A time out was performed prior to the initiation of the procedure. Maximal barrier sterile technique utilized including caps, mask, sterile gowns, sterile gloves, large sterile drape, hand hygiene, and Betadine prep. The right femoral head was marked fluoroscopically. Under sterile conditions and local anesthesia, the right common femoral artery access was performed with a micropuncture needle. Under direct ultrasound guidance, the right common femoral was accessed with a micropuncture kit. An ultrasound image was saved for documentation purposes. This allowed for placement of a 5-French vascular sheath. A limited arteriogram was performed through the side arm of the sheath confirming appropriate access within the right common femoral artery. Over a Bentson wire, a Mickelson catheter was advanced the caudal aspect of the thoracic aorta where was reformed, back bled and flushed. The Mickelson catheter was  then utilized to select the inferior mesenteric artery and a selective superior mesenteric arteriogram was performed. With the use of a fathom 14 microwire, a regular Renegade microcatheter was advanced to the level of the left colic artery and a selective left colic arteriogram was performed. The microcatheter was then utilized to select the inferior division of the left colic artery and a sub selective arteriogram was performed. The microcatheter was then advanced to select the distal arcade supplying the ill-defined area of extravasation involving the mid descending colon. Sub selective injection confirmed appropriate positioning and the vessel was percutaneously coil embolized with two overlapping 2 mm diameter interlock coils. The microcatheter was retracted to the level the left colic artery and a post embolization left colic arteriogram was performed. The microcatheter was then advanced to select the distal arcade felt to potentially be supplying additional collateral supply to the ill-defined area of active extravasation involving the mid descending colon. Sub selective injection confirmed appropriate positioning and the vessel was percutaneously coil embolized with two overlapping 2 mm diameter interlock coils. The microcatheter was then retracted to the to the level pelvic artery and a selective left colic arteriogram was performed. Microcatheter was removed and a post embolization arteriogram was performed at the level of the IMA Images were reviewed and the procedure was terminated. All wires, catheters and sheaths were removed from the patient. Hemostasis was achieved at the right groin access site with manual compression. Procedure was complicated by development of a small hematoma at the right groin access site. The patient otherwise tolerated the procedure well without immediate post procedural complication. FINDINGS: Selective inferior mesenteric arteriogram demonstrates conventional branching  pattern with ill-defined area of extravasation involving the mid descending colon compatible with findings on preceding CTA. Selective left colic arteriogram redemonstrates the ill-defined area of extravasation within the mid descending colon supplied predominantly via the inferior division of the left colic artery with potential collateral supply from the superior division. Sub selective arteriogram of the inferior division the left colic artery confirms supply to the ill-defined area of extravasation and as such the supplying distal arcade was percutaneously coil embolized. Sub selective arteriogram of the superior division of the left colic artery suggests additional collateral supply to the ill-defined area of extravasation and as such the supplying distal arcade was percutaneously coil embolized. Completion left colic and inferior mesenteric arteriograms are negative for residual areas of extravasation or collateral supply to the mid descending colon. Note, superior mesenteric arteriogram was not performed given exhaustive evaluation on dedicated mesenteric arteriogram performed 3 days prior (01/09/2021). IMPRESSION: Technically  successful percutaneous coil embolization of distal arcades arising from both the superior and inferior divisions of the left colic artery supplying the ill-defined area of intraluminal contrast extravasation involving the mid descending colon compatible with the findings seen on preceding CTA. PLAN: - The patient is to remain flat for 4 hours with right leg straight (until at least 0730). - The patient will continue to experience several additional bloody bowel movements and may continue to require additional resuscitation (as the patient was bleeding both before and during the procedure), however ultimately I am hopeful she will stabilize in the coming days. Electronically Signed   By: Sandi Mariscal M.D.   On: 01/12/2021 07:24   IR Angiogram Selective Each Additional Vessel  Result  Date: 01/12/2021 INDICATION: Recurrent lower GI bleeding. Positive CTA. Please perform mesenteric arteriogram with potential embolization. EXAM: 1. ULTRASOUND GUIDANCE FOR ARTERIAL ACCESS 2. SELECTIVE INFERIOR MESENTERIC ARTERIOGRAM 3. SUB SELECTIVE LEFT COLIC ARTERIOGRAM 4. SUB SELECTIVE DISTAL ARCADE DIVISIONS OF THE LEFT COLIC ARTERY AND PERCUTANEOUS COIL EMBOLIZATION COMPARISON:  CTA abdomen and pelvis - 01/11/2021 Mesenteric arteriogram - 01/09/2021 Nuclear medicine tagged red blood cell bleeding scan - 01/08/2021 CT abdomen and pelvis - 01/08/2021 MEDICATIONS: None ANESTHESIA/SEDATION: Moderate (conscious) sedation was employed during this procedure. A total of Versed 0.5 mg was administered intravenously. Moderate Sedation Time: 60 minutes. The patient's level of consciousness and vital signs were monitored continuously by radiology nursing throughout the procedure under my direct supervision. CONTRAST:  45 cc Omnipaque 300 FLUOROSCOPY TIME:  19 minutes (794 mGy) COMPLICATIONS: SIR Level A - No therapy, no consequence. Procedure complicated by development of a small hematoma at the right groin access site. PROCEDURE: Informed consent was obtained from the patient following explanation of the procedure, risks, benefits and alternatives. All questions were addressed. A time out was performed prior to the initiation of the procedure. Maximal barrier sterile technique utilized including caps, mask, sterile gowns, sterile gloves, large sterile drape, hand hygiene, and Betadine prep. The right femoral head was marked fluoroscopically. Under sterile conditions and local anesthesia, the right common femoral artery access was performed with a micropuncture needle. Under direct ultrasound guidance, the right common femoral was accessed with a micropuncture kit. An ultrasound image was saved for documentation purposes. This allowed for placement of a 5-French vascular sheath. A limited arteriogram was performed through  the side arm of the sheath confirming appropriate access within the right common femoral artery. Over a Bentson wire, a Mickelson catheter was advanced the caudal aspect of the thoracic aorta where was reformed, back bled and flushed. The Mickelson catheter was then utilized to select the inferior mesenteric artery and a selective superior mesenteric arteriogram was performed. With the use of a fathom 14 microwire, a regular Renegade microcatheter was advanced to the level of the left colic artery and a selective left colic arteriogram was performed. The microcatheter was then utilized to select the inferior division of the left colic artery and a sub selective arteriogram was performed. The microcatheter was then advanced to select the distal arcade supplying the ill-defined area of extravasation involving the mid descending colon. Sub selective injection confirmed appropriate positioning and the vessel was percutaneously coil embolized with two overlapping 2 mm diameter interlock coils. The microcatheter was retracted to the level the left colic artery and a post embolization left colic arteriogram was performed. The microcatheter was then advanced to select the distal arcade felt to potentially be supplying additional collateral supply to the ill-defined area of active extravasation involving  the mid descending colon. Sub selective injection confirmed appropriate positioning and the vessel was percutaneously coil embolized with two overlapping 2 mm diameter interlock coils. The microcatheter was then retracted to the to the level pelvic artery and a selective left colic arteriogram was performed. Microcatheter was removed and a post embolization arteriogram was performed at the level of the IMA Images were reviewed and the procedure was terminated. All wires, catheters and sheaths were removed from the patient. Hemostasis was achieved at the right groin access site with manual compression. Procedure was  complicated by development of a small hematoma at the right groin access site. The patient otherwise tolerated the procedure well without immediate post procedural complication. FINDINGS: Selective inferior mesenteric arteriogram demonstrates conventional branching pattern with ill-defined area of extravasation involving the mid descending colon compatible with findings on preceding CTA. Selective left colic arteriogram redemonstrates the ill-defined area of extravasation within the mid descending colon supplied predominantly via the inferior division of the left colic artery with potential collateral supply from the superior division. Sub selective arteriogram of the inferior division the left colic artery confirms supply to the ill-defined area of extravasation and as such the supplying distal arcade was percutaneously coil embolized. Sub selective arteriogram of the superior division of the left colic artery suggests additional collateral supply to the ill-defined area of extravasation and as such the supplying distal arcade was percutaneously coil embolized. Completion left colic and inferior mesenteric arteriograms are negative for residual areas of extravasation or collateral supply to the mid descending colon. Note, superior mesenteric arteriogram was not performed given exhaustive evaluation on dedicated mesenteric arteriogram performed 3 days prior (01/09/2021). IMPRESSION: Technically successful percutaneous coil embolization of distal arcades arising from both the superior and inferior divisions of the left colic artery supplying the ill-defined area of intraluminal contrast extravasation involving the mid descending colon compatible with the findings seen on preceding CTA. PLAN: - The patient is to remain flat for 4 hours with right leg straight (until at least 0730). - The patient will continue to experience several additional bloody bowel movements and may continue to require additional resuscitation  (as the patient was bleeding both before and during the procedure), however ultimately I am hopeful she will stabilize in the coming days. Electronically Signed   By: Sandi Mariscal M.D.   On: 01/12/2021 07:24   IR US Guide Vasc Access Right  Result Date: 01/12/2021 INDICATION: Recurrent lower GI bleeding. Positive CTA. Please perform mesenteric arteriogram with potential embolization. EXAM: 1. ULTRASOUND GUIDANCE FOR ARTERIAL ACCESS 2. SELECTIVE INFERIOR MESENTERIC ARTERIOGRAM 3. SUB SELECTIVE LEFT COLIC ARTERIOGRAM 4. SUB SELECTIVE DISTAL ARCADE DIVISIONS OF THE LEFT COLIC ARTERY AND PERCUTANEOUS COIL EMBOLIZATION COMPARISON:  CTA abdomen and pelvis - 01/11/2021 Mesenteric arteriogram - 01/09/2021 Nuclear medicine tagged red blood cell bleeding scan - 01/08/2021 CT abdomen and pelvis - 01/08/2021 MEDICATIONS: None ANESTHESIA/SEDATION: Moderate (conscious) sedation was employed during this procedure. A total of Versed 0.5 mg was administered intravenously. Moderate Sedation Time: 60 minutes. The patient's level of consciousness and vital signs were monitored continuously by radiology nursing throughout the procedure under my direct supervision. CONTRAST:  45 cc Omnipaque 300 FLUOROSCOPY TIME:  19 minutes (615 mGy) COMPLICATIONS: SIR Level A - No therapy, no consequence. Procedure complicated by development of a small hematoma at the right groin access site. PROCEDURE: Informed consent was obtained from the patient following explanation of the procedure, risks, benefits and alternatives. All questions were addressed. A time out was performed prior to the initiation  of the procedure. Maximal barrier sterile technique utilized including caps, mask, sterile gowns, sterile gloves, large sterile drape, hand hygiene, and Betadine prep. The right femoral head was marked fluoroscopically. Under sterile conditions and local anesthesia, the right common femoral artery access was performed with a micropuncture needle. Under  direct ultrasound guidance, the right common femoral was accessed with a micropuncture kit. An ultrasound image was saved for documentation purposes. This allowed for placement of a 5-French vascular sheath. A limited arteriogram was performed through the side arm of the sheath confirming appropriate access within the right common femoral artery. Over a Bentson wire, a Mickelson catheter was advanced the caudal aspect of the thoracic aorta where was reformed, back bled and flushed. The Mickelson catheter was then utilized to select the inferior mesenteric artery and a selective superior mesenteric arteriogram was performed. With the use of a fathom 14 microwire, a regular Renegade microcatheter was advanced to the level of the left colic artery and a selective left colic arteriogram was performed. The microcatheter was then utilized to select the inferior division of the left colic artery and a sub selective arteriogram was performed. The microcatheter was then advanced to select the distal arcade supplying the ill-defined area of extravasation involving the mid descending colon. Sub selective injection confirmed appropriate positioning and the vessel was percutaneously coil embolized with two overlapping 2 mm diameter interlock coils. The microcatheter was retracted to the level the left colic artery and a post embolization left colic arteriogram was performed. The microcatheter was then advanced to select the distal arcade felt to potentially be supplying additional collateral supply to the ill-defined area of active extravasation involving the mid descending colon. Sub selective injection confirmed appropriate positioning and the vessel was percutaneously coil embolized with two overlapping 2 mm diameter interlock coils. The microcatheter was then retracted to the to the level pelvic artery and a selective left colic arteriogram was performed. Microcatheter was removed and a post embolization arteriogram was  performed at the level of the IMA Images were reviewed and the procedure was terminated. All wires, catheters and sheaths were removed from the patient. Hemostasis was achieved at the right groin access site with manual compression. Procedure was complicated by development of a small hematoma at the right groin access site. The patient otherwise tolerated the procedure well without immediate post procedural complication. FINDINGS: Selective inferior mesenteric arteriogram demonstrates conventional branching pattern with ill-defined area of extravasation involving the mid descending colon compatible with findings on preceding CTA. Selective left colic arteriogram redemonstrates the ill-defined area of extravasation within the mid descending colon supplied predominantly via the inferior division of the left colic artery with potential collateral supply from the superior division. Sub selective arteriogram of the inferior division the left colic artery confirms supply to the ill-defined area of extravasation and as such the supplying distal arcade was percutaneously coil embolized. Sub selective arteriogram of the superior division of the left colic artery suggests additional collateral supply to the ill-defined area of extravasation and as such the supplying distal arcade was percutaneously coil embolized. Completion left colic and inferior mesenteric arteriograms are negative for residual areas of extravasation or collateral supply to the mid descending colon. Note, superior mesenteric arteriogram was not performed given exhaustive evaluation on dedicated mesenteric arteriogram performed 3 days prior (01/09/2021). IMPRESSION: Technically successful percutaneous coil embolization of distal arcades arising from both the superior and inferior divisions of the left colic artery supplying the ill-defined area of intraluminal contrast extravasation involving the mid descending  colon compatible with the findings seen on  preceding CTA. PLAN: - The patient is to remain flat for 4 hours with right leg straight (until at least 0730). - The patient will continue to experience several additional bloody bowel movements and may continue to require additional resuscitation (as the patient was bleeding both before and during the procedure), however ultimately I am hopeful she will stabilize in the coming days. Electronically Signed   By: Sandi Mariscal M.D.   On: 01/12/2021 07:24   IR EMBO ARTERIAL NOT HEMORR HEMANG INC GUIDE ROADMAPPING  Result Date: 01/12/2021 INDICATION: Recurrent lower GI bleeding. Positive CTA. Please perform mesenteric arteriogram with potential embolization. EXAM: 1. ULTRASOUND GUIDANCE FOR ARTERIAL ACCESS 2. SELECTIVE INFERIOR MESENTERIC ARTERIOGRAM 3. SUB SELECTIVE LEFT COLIC ARTERIOGRAM 4. SUB SELECTIVE DISTAL ARCADE DIVISIONS OF THE LEFT COLIC ARTERY AND PERCUTANEOUS COIL EMBOLIZATION COMPARISON:  CTA abdomen and pelvis - 01/11/2021 Mesenteric arteriogram - 01/09/2021 Nuclear medicine tagged red blood cell bleeding scan - 01/08/2021 CT abdomen and pelvis - 01/08/2021 MEDICATIONS: None ANESTHESIA/SEDATION: Moderate (conscious) sedation was employed during this procedure. A total of Versed 0.5 mg was administered intravenously. Moderate Sedation Time: 60 minutes. The patient's level of consciousness and vital signs were monitored continuously by radiology nursing throughout the procedure under my direct supervision. CONTRAST:  45 cc Omnipaque 300 FLUOROSCOPY TIME:  19 minutes (202 mGy) COMPLICATIONS: SIR Level A - No therapy, no consequence. Procedure complicated by development of a small hematoma at the right groin access site. PROCEDURE: Informed consent was obtained from the patient following explanation of the procedure, risks, benefits and alternatives. All questions were addressed. A time out was performed prior to the initiation of the procedure. Maximal barrier sterile technique utilized including caps,  mask, sterile gowns, sterile gloves, large sterile drape, hand hygiene, and Betadine prep. The right femoral head was marked fluoroscopically. Under sterile conditions and local anesthesia, the right common femoral artery access was performed with a micropuncture needle. Under direct ultrasound guidance, the right common femoral was accessed with a micropuncture kit. An ultrasound image was saved for documentation purposes. This allowed for placement of a 5-French vascular sheath. A limited arteriogram was performed through the side arm of the sheath confirming appropriate access within the right common femoral artery. Over a Bentson wire, a Mickelson catheter was advanced the caudal aspect of the thoracic aorta where was reformed, back bled and flushed. The Mickelson catheter was then utilized to select the inferior mesenteric artery and a selective superior mesenteric arteriogram was performed. With the use of a fathom 14 microwire, a regular Renegade microcatheter was advanced to the level of the left colic artery and a selective left colic arteriogram was performed. The microcatheter was then utilized to select the inferior division of the left colic artery and a sub selective arteriogram was performed. The microcatheter was then advanced to select the distal arcade supplying the ill-defined area of extravasation involving the mid descending colon. Sub selective injection confirmed appropriate positioning and the vessel was percutaneously coil embolized with two overlapping 2 mm diameter interlock coils. The microcatheter was retracted to the level the left colic artery and a post embolization left colic arteriogram was performed. The microcatheter was then advanced to select the distal arcade felt to potentially be supplying additional collateral supply to the ill-defined area of active extravasation involving the mid descending colon. Sub selective injection confirmed appropriate positioning and the vessel was  percutaneously coil embolized with two overlapping 2 mm diameter interlock coils. The microcatheter was  then retracted to the to the level pelvic artery and a selective left colic arteriogram was performed. Microcatheter was removed and a post embolization arteriogram was performed at the level of the IMA Images were reviewed and the procedure was terminated. All wires, catheters and sheaths were removed from the patient. Hemostasis was achieved at the right groin access site with manual compression. Procedure was complicated by development of a small hematoma at the right groin access site. The patient otherwise tolerated the procedure well without immediate post procedural complication. FINDINGS: Selective inferior mesenteric arteriogram demonstrates conventional branching pattern with ill-defined area of extravasation involving the mid descending colon compatible with findings on preceding CTA. Selective left colic arteriogram redemonstrates the ill-defined area of extravasation within the mid descending colon supplied predominantly via the inferior division of the left colic artery with potential collateral supply from the superior division. Sub selective arteriogram of the inferior division the left colic artery confirms supply to the ill-defined area of extravasation and as such the supplying distal arcade was percutaneously coil embolized. Sub selective arteriogram of the superior division of the left colic artery suggests additional collateral supply to the ill-defined area of extravasation and as such the supplying distal arcade was percutaneously coil embolized. Completion left colic and inferior mesenteric arteriograms are negative for residual areas of extravasation or collateral supply to the mid descending colon. Note, superior mesenteric arteriogram was not performed given exhaustive evaluation on dedicated mesenteric arteriogram performed 3 days prior (01/09/2021). IMPRESSION: Technically successful  percutaneous coil embolization of distal arcades arising from both the superior and inferior divisions of the left colic artery supplying the ill-defined area of intraluminal contrast extravasation involving the mid descending colon compatible with the findings seen on preceding CTA. PLAN: - The patient is to remain flat for 4 hours with right leg straight (until at least 0730). - The patient will continue to experience several additional bloody bowel movements and may continue to require additional resuscitation (as the patient was bleeding both before and during the procedure), however ultimately I am hopeful she will stabilize in the coming days. Electronically Signed   By: Sandi Mariscal M.D.   On: 01/12/2021 07:24   CT Angio Abd/Pel W and/or Wo Contrast  Result Date: 01/11/2021 CLINICAL DATA:  Gastrointestinal bleed.  bright red blood. EXAM: CTA ABDOMEN AND PELVIS WITHOUT AND WITH CONTRAST TECHNIQUE: Multidetector CT imaging of the abdomen and pelvis was performed using the standard protocol during bolus administration of intravenous contrast. Multiplanar reconstructed images and MIPs were obtained and reviewed to evaluate the vascular anatomy. CONTRAST:  173m OMNIPAQUE IOHEXOL 350 MG/ML SOLN COMPARISON:  Nuclear medicine gastrointestinal bleeding scan 01/08/2021, CT abdomen pelvis 01/08/2021 FINDINGS: VASCULAR Aorta: Severe atherosclerotic plaque. Normal caliber aorta without aneurysm, dissection, vasculitis or significant stenosis. Celiac: Patent without evidence of aneurysm, dissection, vasculitis or significant stenosis. SMA: Patent without evidence of aneurysm, dissection, vasculitis or significant stenosis. Renals: Mild atherosclerotic plaque of the origins. Both renal arteries are patent without evidence of aneurysm, dissection, vasculitis, fibromuscular dysplasia or significant stenosis. IMA: Patent without evidence of aneurysm, dissection, vasculitis or significant stenosis. Inflow: Severe  atherosclerotic plaque. Patent without evidence of aneurysm, dissection, vasculitis or significant stenosis. Proximal Outflow: At least moderate atherosclerotic plaque. Bilateral common femoral and visualized portions of the superficial and profunda femoral arteries are patent without evidence of aneurysm, dissection, vasculitis or significant stenosis. Veins: The hepatic, portal, splenic, superior mesenteric veins are patent. NON-VASCULAR Lower chest: Interval development of a 1.6 x 1.3 cm right lower lobe  pulmonary nodular like consolidation likely represents atelectasis (not visualized on CT 2 days ago. Subsegmental atelectasis within bilateral lower lobes. Trace left pleural effusion. Hepatobiliary: No focal liver abnormality. No gallstones, gallbladder wall thickening, or pericholecystic fluid. No biliary dilatation. Pancreas: No focal lesion. Normal pancreatic contour. No surrounding inflammatory changes. No main pancreatic ductal dilatation. Spleen: Normal in size without focal abnormality. Adrenals/Urinary Tract: No adrenal nodule bilaterally. Bilateral kidneys enhance symmetrically. Subcentimeter hypodensities within the kidneys are too small to characterize. No hydronephrosis. No hydroureter. The urinary bladder is unremarkable. Stomach/Bowel: Left distal descending colon/sigmoid colon reanastomosis. Layering hyperdensity within the mid to distal descending colon is noted to slightly increase in density on the portal venous phase but is also not visualized on noncontrast study. Associated slightly hyperdense material within the colonic lumen distally. The colon is noted to be fluid-filled past the splenic flexure. No bowel wall thickening or dilatation. The appendix not definitely identified. Lymphatic: No lymphadenopathy. Reproductive: Status post hysterectomy. No adnexal masses. Other: No intraperitoneal free fluid. No intraperitoneal free gas. No organized fluid collection. Musculoskeletal: No abdominal  wall hernia or abnormality. No suspicious lytic or blastic osseous lesions. No acute displaced fracture. Multilevel degenerative changes of the spine. 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 IMPRESSION: VASCULAR Aortic Atherosclerosis (ICD10-I70.0). NON-VASCULAR Mid to distal descending colon gastrointestinal hemorrhage that is located proximal to the colonic anastomosis. Electronically Signed   By: Iven Finn M.D.   On: 01/11/2021 22:36   Scheduled Meds: . sodium chloride   Intravenous Once  . budesonide  6 mg Oral Daily  . calcium carbonate  1,250 mg Oral Daily  . Chlorhexidine Gluconate Cloth  6 each Topical Daily  . ferrous sulfate  325 mg Oral BID  . lidocaine      . midazolam      . pantoprazole (PROTONIX) IV  40 mg Intravenous Q12H  . potassium chloride  40 mEq Oral BID  . pravastatin  10 mg Oral Daily   Continuous Infusions: . lactated ringers 50 mL/hr at 01/12/21 0900    LOS: 1 day   Kerney Elbe, DO [facility] PAGER is on [place]  If 7PM-7AM, please contact night-coverage [email or web address]

## 2021-01-12 NOTE — Consult Note (Addendum)
Consultation  Referring Provider:    Dr. Alfredia Ferguson  Primary Gastroenterologist: Dr. Henrene Pastor       Reason for Consultation:GI  Bleed              HPI:   Kathleen Pope is an 87 y.o. female with a past medical history of recurrent GI bleeding, collagenous colitis, COPD, aortic stenosis and status post remote sigmoid colectomy done), who returned to the hospital 01/11/2021 for GI bleed.    Patient was recently admitted 01/08/2021-01/10/2021 for GI bleed.  Admission bleeding scan was done and positive left-sided source, though is not able to be found on CTA.  Colonoscopy 2/26 revealed multiple small polyps and AVM which was cauterized.  She was discharged on Protonix twice a day and aspirin held for a week.  She was discharged 01/10/2021 and was doing well.    Today, patient explains that the very next day after being discharged on 01/11/2021 around 6:00pm she had 2 episodes of very large volume bright red blood per rectum.  Tells me that she decided to come back to the hospital.  Upon arriving she had another with a lot of clots and they "rushed me" for imaging.  She was able to have a successful CT angio with coil embolization yesterday with IR.  Tells me she has not had another bowel movement since then.  She is tolerating her full liquid diet this morning.  Feeling fairly well.    Denies fever, chills or weight loss.  ED course: Blood pressure 100/64 dropping in the 80s, tachycardic at 130, hemoglobin dropped from 10.1 down to 8.7 overnight, CT angio showed active bleeding in the mesentery  Past Medical History:  Diagnosis Date  . Anemia    pt denies, yet takes iron  . Chronic airway obstruction, not elsewhere classified   . COPD (chronic obstructive pulmonary disease) ([place])   . Diverticulosis   . GI hemorrhage from post-treatment cecal ulcer 08/18/2012  . Hemorrhoids   . HTN (hypertension)   . Hypercholesteremia   . Lower GI bleed multiple   AVM's    Past Surgical History:  Procedure  Laterality Date  . BUNIONECTOMY     left  . COLECTOMY    . COLONOSCOPY  08/10/2012   Procedure: COLONOSCOPY;  Surgeon: Ladene Artist, MD,FACG;  Location: [facility];  Service: Endoscopy;  Laterality: N/A;  . COLONOSCOPY  08/19/2012   Procedure: COLONOSCOPY;  Surgeon: Gatha Mayer, MD;  Location: [facility];  Service: Endoscopy;  Laterality: N/A;  . COLONOSCOPY N/A 03/11/2013   Procedure: COLONOSCOPY;  Surgeon: Irene Shipper, MD;  Location: [facility];  Service: Endoscopy;  Laterality: N/A;  . IR ANGIOGRAM SELECTIVE EACH ADDITIONAL VESSEL  01/12/2021  . IR ANGIOGRAM SELECTIVE EACH ADDITIONAL VESSEL  01/12/2021  . IR ANGIOGRAM SELECTIVE EACH ADDITIONAL VESSEL  01/12/2021  . IR ANGIOGRAM VISCERAL SELECTIVE  01/09/2021  . IR ANGIOGRAM VISCERAL SELECTIVE  01/12/2021  . IR EMBO ARTERIAL NOT HEMORR HEMANG INC GUIDE ROADMAPPING  01/12/2021  . IR US GUIDE VASC ACCESS RIGHT  01/09/2021  . IR US GUIDE VASC ACCESS RIGHT  01/12/2021  . TONSILLECTOMY      Family History  Problem Relation Age of Onset  . Breast cancer Mother   . Lung cancer Father   . Breast cancer Sister   . Anuerysm Brother        in stomach  . Colon cancer Neg Hx   . Esophageal cancer Neg Hx   .  Rectal cancer Neg Hx     Social History   Tobacco Use  . Smoking status: Current Every Day Smoker    Packs/day: 1.00    Years: 59.00    Pack years: 59.00    Types: Cigarettes  . Smokeless tobacco: Never Used  . Tobacco comment: tobacco info given 06/09/18  Vaping Use  . Vaping Use: Never used  Substance Use Topics  . Alcohol use: No  . Drug use: No    Prior to Admission medications   Medication Sig Start Date End Date Taking? Authorizing Provider  aspirin EC 81 MG tablet Take 1 tablet (81 mg total) by mouth every other day. 01/17/21  Yes Nita Sells, MD  budesonide (ENTOCORT EC) 3 MG 24 hr capsule Take 26m for one month; then 6 mg for one month; then 3 mg for one month; then stop Patient taking differently: Take  6 mg by mouth daily. Take 937mfor one month; then 6 mg for one month; then 3 mg for one month; then stop 03/12/20  Yes PeIrene ShipperMD  calcium carbonate (OS-CAL) 600 MG TABS Take 600 mg by mouth daily.   Yes [provider]  ECHINACEA PO Take 1 capsule by mouth daily.   Yes [provider]  ferrous sulfate 325 (65 FE) MG EC tablet Take 325 mg by mouth 2 (two) times daily.    Yes [provider]  losartan (COZAAR) 50 MG tablet Take 50 mg by mouth daily.   Yes [provider]  pravastatin (PRAVACHOL) 10 MG tablet Take 10 mg by mouth daily.   Yes [provider]  pantoprazole (PROTONIX) 40 MG tablet Take 1 tablet (40 mg total) by mouth 2 (two) times daily. 01/10/21 03/11/21  SaNita SellsMD    Current Facility-Administered Medications  Medication Dose Route Frequency Provider Last Rate Last Admin  . 0.9 %  sodium chloride infusion (Manually program via Guardrails IV Fluids)   Intravenous Once Joy, Shawn C, PA-C      . budesonide (ENTOCORT EC) 24 hr capsule 6 mg  6 mg Oral Daily Garba, Mohammad L, MD      . calcium carbonate (OS-CAL - dosed in mg of elemental calcium) tablet 1,250 mg  1,250 mg Oral Daily Garba, Mohammad L, MD      . Chlorhexidine Gluconate Cloth 2 % PADS 6 each  6 each Topical Daily Garba, Mohammad L, MD      . ferrous sulfate tablet 325 mg  325 mg Oral BID GaJonelle SidleMohammad L, MD      . lactated ringers infusion   Intravenous Continuous GaElwyn ReachMD 50 mL/hr at 01/12/21 0500 Infusion Verify at 01/12/21 0500  . lidocaine (XYLOCAINE) 1 % (with pres) injection           . midazolam (VERSED) 2 MG/2ML injection           . ondansetron (ZOFRAN) tablet 4 mg  4 mg Oral Q6H PRN GaElwyn ReachMD       Or  . ondansetron (ZOFRAN) injection 4 mg  4 mg Intravenous Q6H PRN GaGala Romney, MD      . pantoprazole (PROTONIX) injection 40 mg  40 mg Intravenous Q12H GaElwyn ReachMD   40 mg at 01/12/21 0452  . pravastatin  (PRAVACHOL) tablet 10 mg  10 mg Oral Daily GaElwyn ReachMD        Allergies as of 01/11/2021  . (No Known Allergies)  Review of Systems:    Constitutional: No weight loss, fever or chills Skin: No rash Cardiovascular: No chest pain  Respiratory: No SOB Gastrointestinal: See HPI and otherwise negative Genitourinary: No dysuria  Neurological: No headache, dizziness or syncope Musculoskeletal: No new muscle or joint pain Hematologic: No bruising Psychiatric: No history of depression or anxiety     Physical Exam:  Vital signs in last 24 hours: Temp:  [97.5 F (36.4 C)-98.6 F (37 C)] 98.6 F (37 C) (02/28 0445) Pulse Rate:  [78-130] 92 (02/28 0700) Resp:  [11-22] 18 (02/28 0700) BP: (76-164)/(54-78) 133/60 (02/28 0600) SpO2:  [96 %-100 %] 99 % (02/28 0600) Weight:  [39.9 kg] 39.9 kg (02/27 1908)   General:   Pleasant Elderly Caucasian female appears to be in NAD, Well developed, Well nourished, alert and cooperative Head:  Normocephalic and atraumatic. Eyes:   PEERL, EOMI. No icterus. Conjunctiva pink. Ears:  Normal auditory acuity. Neck:  Supple Throat: Oral cavity and pharynx without inflammation, swelling or lesion.  Lungs: Respirations even and unlabored. Lungs clear to auscultation bilaterally.   No wheezes, crackles, or rhonchi.  Heart: Normal S1, S2. No MRG. Regular rate and rhythm. No peripheral edema, cyanosis or pallor.  Abdomen:  Soft, nondistended, nontender. No rebound or guarding. Normal bowel sounds. No appreciable masses or hepatomegaly. Rectal:  Not performed.  Msk:  Symmetrical without gross deformities. Peripheral pulses intact.  Extremities:  Without edema, no deformity or joint abnormality.  Neurologic:  Alert and  oriented x4;  grossly normal neurologically.  Skin:   Dry and intact without significant lesions or rashes. Psychiatric: Demonstrates good judgement and reason without abnormal affect or behaviors.   LAB RESULTS: Recent Labs     01/10/21 0305 01/10/21 0829 01/11/21 2015 01/12/21 0700  WBC 7.1  --  8.4 8.8  HGB 10.1*  10.2* 10.9* 8.7* 7.7*  HCT 29.8*  30.1* 32.2* 25.7* 22.9*  PLT 189  --  225 180   BMET Recent Labs    01/10/21 0305 01/11/21 2015 01/12/21 0700  NA 142 143 142  K 3.6 3.6 3.3*  CL 111 106 110  CO2 25 27 23   GLUCOSE 117* 200* 107*  BUN 9 16 16   CREATININE 0.72 0.85 0.59  CALCIUM 8.2* 8.4* 7.3*   LFT Recent Labs    01/11/21 2015  PROT 5.4*  ALBUMIN 3.0*  AST 19  ALT 15  ALKPHOS 43  BILITOT 0.4    STUDIES: IR Angiogram Visceral Selective  Result Date: 01/12/2021 INDICATION: Recurrent lower GI bleeding. Positive CTA. Please perform mesenteric arteriogram with potential embolization. EXAM: 1. ULTRASOUND GUIDANCE FOR ARTERIAL ACCESS 2. SELECTIVE INFERIOR MESENTERIC ARTERIOGRAM 3. SUB SELECTIVE LEFT COLIC ARTERIOGRAM 4. SUB SELECTIVE DISTAL ARCADE DIVISIONS OF THE LEFT COLIC ARTERY AND PERCUTANEOUS COIL EMBOLIZATION COMPARISON:  CTA abdomen and pelvis - 01/11/2021 Mesenteric arteriogram - 01/09/2021 Nuclear medicine tagged red blood cell bleeding scan - 01/08/2021 CT abdomen and pelvis - 01/08/2021 MEDICATIONS: None ANESTHESIA/SEDATION: Moderate (conscious) sedation was employed during this procedure. A total of Versed 0.5 mg was administered intravenously. Moderate Sedation Time: 60 minutes. The patient's level of consciousness and vital signs were monitored continuously by radiology nursing throughout the procedure under my direct supervision. CONTRAST:  45 cc Omnipaque 300 FLUOROSCOPY TIME:  19 minutes (465 mGy) COMPLICATIONS: SIR Level A - No therapy, no consequence. Procedure complicated by development of a small hematoma at the right groin access site. PROCEDURE: Informed consent was obtained from the patient following explanation of the procedure, risks,  benefits and alternatives. All questions were addressed. A time out was performed prior to the initiation of the procedure. Maximal  barrier sterile technique utilized including caps, mask, sterile gowns, sterile gloves, large sterile drape, hand hygiene, and Betadine prep. The right femoral head was marked fluoroscopically. Under sterile conditions and local anesthesia, the right common femoral artery access was performed with a micropuncture needle. Under direct ultrasound guidance, the right common femoral was accessed with a micropuncture kit. An ultrasound image was saved for documentation purposes. This allowed for placement of a 5-French vascular sheath. A limited arteriogram was performed through the side arm of the sheath confirming appropriate access within the right common femoral artery. Over a Bentson wire, a Mickelson catheter was advanced the caudal aspect of the thoracic aorta where was reformed, back bled and flushed. The Mickelson catheter was then utilized to select the inferior mesenteric artery and a selective superior mesenteric arteriogram was performed. With the use of a fathom 14 microwire, a regular Renegade microcatheter was advanced to the level of the left colic artery and a selective left colic arteriogram was performed. The microcatheter was then utilized to select the inferior division of the left colic artery and a sub selective arteriogram was performed. The microcatheter was then advanced to select the distal arcade supplying the ill-defined area of extravasation involving the mid descending colon. Sub selective injection confirmed appropriate positioning and the vessel was percutaneously coil embolized with two overlapping 2 mm diameter interlock coils. The microcatheter was retracted to the level the left colic artery and a post embolization left colic arteriogram was performed. The microcatheter was then advanced to select the distal arcade felt to potentially be supplying additional collateral supply to the ill-defined area of active extravasation involving the mid descending colon. Sub selective injection  confirmed appropriate positioning and the vessel was percutaneously coil embolized with two overlapping 2 mm diameter interlock coils. The microcatheter was then retracted to the to the level pelvic artery and a selective left colic arteriogram was performed. Microcatheter was removed and a post embolization arteriogram was performed at the level of the IMA Images were reviewed and the procedure was terminated. All wires, catheters and sheaths were removed from the patient. Hemostasis was achieved at the right groin access site with manual compression. Procedure was complicated by development of a small hematoma at the right groin access site. The patient otherwise tolerated the procedure well without immediate post procedural complication. FINDINGS: Selective inferior mesenteric arteriogram demonstrates conventional branching pattern with ill-defined area of extravasation involving the mid descending colon compatible with findings on preceding CTA. Selective left colic arteriogram redemonstrates the ill-defined area of extravasation within the mid descending colon supplied predominantly via the inferior division of the left colic artery with potential collateral supply from the superior division. Sub selective arteriogram of the inferior division the left colic artery confirms supply to the ill-defined area of extravasation and as such the supplying distal arcade was percutaneously coil embolized. Sub selective arteriogram of the superior division of the left colic artery suggests additional collateral supply to the ill-defined area of extravasation and as such the supplying distal arcade was percutaneously coil embolized. Completion left colic and inferior mesenteric arteriograms are negative for residual areas of extravasation or collateral supply to the mid descending colon. Note, superior mesenteric arteriogram was not performed given exhaustive evaluation on dedicated mesenteric arteriogram performed 3 days  prior (01/09/2021). IMPRESSION: Technically successful percutaneous coil embolization of distal arcades arising from both the superior and inferior divisions  of the left colic artery supplying the ill-defined area of intraluminal contrast extravasation involving the mid descending colon compatible with the findings seen on preceding CTA. PLAN: - The patient is to remain flat for 4 hours with right leg straight (until at least 0730). - The patient will continue to experience several additional bloody bowel movements and may continue to require additional resuscitation (as the patient was bleeding both before and during the procedure), however ultimately I am hopeful she will stabilize in the coming days. Electronically Signed   By: Sandi Mariscal M.D.   On: 01/12/2021 07:24   IR Angiogram Selective Each Additional Vessel  Result Date: 01/12/2021 INDICATION: Recurrent lower GI bleeding. Positive CTA. Please perform mesenteric arteriogram with potential embolization. EXAM: 1. ULTRASOUND GUIDANCE FOR ARTERIAL ACCESS 2. SELECTIVE INFERIOR MESENTERIC ARTERIOGRAM 3. SUB SELECTIVE LEFT COLIC ARTERIOGRAM 4. SUB SELECTIVE DISTAL ARCADE DIVISIONS OF THE LEFT COLIC ARTERY AND PERCUTANEOUS COIL EMBOLIZATION COMPARISON:  CTA abdomen and pelvis - 01/11/2021 Mesenteric arteriogram - 01/09/2021 Nuclear medicine tagged red blood cell bleeding scan - 01/08/2021 CT abdomen and pelvis - 01/08/2021 MEDICATIONS: None ANESTHESIA/SEDATION: Moderate (conscious) sedation was employed during this procedure. A total of Versed 0.5 mg was administered intravenously. Moderate Sedation Time: 60 minutes. The patient's level of consciousness and vital signs were monitored continuously by radiology nursing throughout the procedure under my direct supervision. CONTRAST:  45 cc Omnipaque 300 FLUOROSCOPY TIME:  19 minutes (211 mGy) COMPLICATIONS: SIR Level A - No therapy, no consequence. Procedure complicated by development of a small hematoma at the  right groin access site. PROCEDURE: Informed consent was obtained from the patient following explanation of the procedure, risks, benefits and alternatives. All questions were addressed. A time out was performed prior to the initiation of the procedure. Maximal barrier sterile technique utilized including caps, mask, sterile gowns, sterile gloves, large sterile drape, hand hygiene, and Betadine prep. The right femoral head was marked fluoroscopically. Under sterile conditions and local anesthesia, the right common femoral artery access was performed with a micropuncture needle. Under direct ultrasound guidance, the right common femoral was accessed with a micropuncture kit. An ultrasound image was saved for documentation purposes. This allowed for placement of a 5-French vascular sheath. A limited arteriogram was performed through the side arm of the sheath confirming appropriate access within the right common femoral artery. Over a Bentson wire, a Mickelson catheter was advanced the caudal aspect of the thoracic aorta where was reformed, back bled and flushed. The Mickelson catheter was then utilized to select the inferior mesenteric artery and a selective superior mesenteric arteriogram was performed. With the use of a fathom 14 microwire, a regular Renegade microcatheter was advanced to the level of the left colic artery and a selective left colic arteriogram was performed. The microcatheter was then utilized to select the inferior division of the left colic artery and a sub selective arteriogram was performed. The microcatheter was then advanced to select the distal arcade supplying the ill-defined area of extravasation involving the mid descending colon. Sub selective injection confirmed appropriate positioning and the vessel was percutaneously coil embolized with two overlapping 2 mm diameter interlock coils. The microcatheter was retracted to the level the left colic artery and a post embolization left colic  arteriogram was performed. The microcatheter was then advanced to select the distal arcade felt to potentially be supplying additional collateral supply to the ill-defined area of active extravasation involving the mid descending colon. Sub selective injection confirmed appropriate positioning and the vessel was percutaneously  coil embolized with two overlapping 2 mm diameter interlock coils. The microcatheter was then retracted to the to the level pelvic artery and a selective left colic arteriogram was performed. Microcatheter was removed and a post embolization arteriogram was performed at the level of the IMA Images were reviewed and the procedure was terminated. All wires, catheters and sheaths were removed from the patient. Hemostasis was achieved at the right groin access site with manual compression. Procedure was complicated by development of a small hematoma at the right groin access site. The patient otherwise tolerated the procedure well without immediate post procedural complication. FINDINGS: Selective inferior mesenteric arteriogram demonstrates conventional branching pattern with ill-defined area of extravasation involving the mid descending colon compatible with findings on preceding CTA. Selective left colic arteriogram redemonstrates the ill-defined area of extravasation within the mid descending colon supplied predominantly via the inferior division of the left colic artery with potential collateral supply from the superior division. Sub selective arteriogram of the inferior division the left colic artery confirms supply to the ill-defined area of extravasation and as such the supplying distal arcade was percutaneously coil embolized. Sub selective arteriogram of the superior division of the left colic artery suggests additional collateral supply to the ill-defined area of extravasation and as such the supplying distal arcade was percutaneously coil embolized. Completion left colic and inferior  mesenteric arteriograms are negative for residual areas of extravasation or collateral supply to the mid descending colon. Note, superior mesenteric arteriogram was not performed given exhaustive evaluation on dedicated mesenteric arteriogram performed 3 days prior (01/09/2021). IMPRESSION: Technically successful percutaneous coil embolization of distal arcades arising from both the superior and inferior divisions of the left colic artery supplying the ill-defined area of intraluminal contrast extravasation involving the mid descending colon compatible with the findings seen on preceding CTA. PLAN: - The patient is to remain flat for 4 hours with right leg straight (until at least 0730). - The patient will continue to experience several additional bloody bowel movements and may continue to require additional resuscitation (as the patient was bleeding both before and during the procedure), however ultimately I am hopeful she will stabilize in the coming days. Electronically Signed   By: Sandi Mariscal M.D.   On: 01/12/2021 07:24   IR Angiogram Selective Each Additional Vessel  Result Date: 01/12/2021 INDICATION: Recurrent lower GI bleeding. Positive CTA. Please perform mesenteric arteriogram with potential embolization. EXAM: 1. ULTRASOUND GUIDANCE FOR ARTERIAL ACCESS 2. SELECTIVE INFERIOR MESENTERIC ARTERIOGRAM 3. SUB SELECTIVE LEFT COLIC ARTERIOGRAM 4. SUB SELECTIVE DISTAL ARCADE DIVISIONS OF THE LEFT COLIC ARTERY AND PERCUTANEOUS COIL EMBOLIZATION COMPARISON:  CTA abdomen and pelvis - 01/11/2021 Mesenteric arteriogram - 01/09/2021 Nuclear medicine tagged red blood cell bleeding scan - 01/08/2021 CT abdomen and pelvis - 01/08/2021 MEDICATIONS: None ANESTHESIA/SEDATION: Moderate (conscious) sedation was employed during this procedure. A total of Versed 0.5 mg was administered intravenously. Moderate Sedation Time: 60 minutes. The patient's level of consciousness and vital signs were monitored continuously by  radiology nursing throughout the procedure under my direct supervision. CONTRAST:  45 cc Omnipaque 300 FLUOROSCOPY TIME:  19 minutes (983 mGy) COMPLICATIONS: SIR Level A - No therapy, no consequence. Procedure complicated by development of a small hematoma at the right groin access site. PROCEDURE: Informed consent was obtained from the patient following explanation of the procedure, risks, benefits and alternatives. All questions were addressed. A time out was performed prior to the initiation of the procedure. Maximal barrier sterile technique utilized including caps, mask, sterile gowns, sterile gloves,  large sterile drape, hand hygiene, and Betadine prep. The right femoral head was marked fluoroscopically. Under sterile conditions and local anesthesia, the right common femoral artery access was performed with a micropuncture needle. Under direct ultrasound guidance, the right common femoral was accessed with a micropuncture kit. An ultrasound image was saved for documentation purposes. This allowed for placement of a 5-French vascular sheath. A limited arteriogram was performed through the side arm of the sheath confirming appropriate access within the right common femoral artery. Over a Bentson wire, a Mickelson catheter was advanced the caudal aspect of the thoracic aorta where was reformed, back bled and flushed. The Mickelson catheter was then utilized to select the inferior mesenteric artery and a selective superior mesenteric arteriogram was performed. With the use of a fathom 14 microwire, a regular Renegade microcatheter was advanced to the level of the left colic artery and a selective left colic arteriogram was performed. The microcatheter was then utilized to select the inferior division of the left colic artery and a sub selective arteriogram was performed. The microcatheter was then advanced to select the distal arcade supplying the ill-defined area of extravasation involving the mid descending  colon. Sub selective injection confirmed appropriate positioning and the vessel was percutaneously coil embolized with two overlapping 2 mm diameter interlock coils. The microcatheter was retracted to the level the left colic artery and a post embolization left colic arteriogram was performed. The microcatheter was then advanced to select the distal arcade felt to potentially be supplying additional collateral supply to the ill-defined area of active extravasation involving the mid descending colon. Sub selective injection confirmed appropriate positioning and the vessel was percutaneously coil embolized with two overlapping 2 mm diameter interlock coils. The microcatheter was then retracted to the to the level pelvic artery and a selective left colic arteriogram was performed. Microcatheter was removed and a post embolization arteriogram was performed at the level of the IMA Images were reviewed and the procedure was terminated. All wires, catheters and sheaths were removed from the patient. Hemostasis was achieved at the right groin access site with manual compression. Procedure was complicated by development of a small hematoma at the right groin access site. The patient otherwise tolerated the procedure well without immediate post procedural complication. FINDINGS: Selective inferior mesenteric arteriogram demonstrates conventional branching pattern with ill-defined area of extravasation involving the mid descending colon compatible with findings on preceding CTA. Selective left colic arteriogram redemonstrates the ill-defined area of extravasation within the mid descending colon supplied predominantly via the inferior division of the left colic artery with potential collateral supply from the superior division. Sub selective arteriogram of the inferior division the left colic artery confirms supply to the ill-defined area of extravasation and as such the supplying distal arcade was percutaneously coil  embolized. Sub selective arteriogram of the superior division of the left colic artery suggests additional collateral supply to the ill-defined area of extravasation and as such the supplying distal arcade was percutaneously coil embolized. Completion left colic and inferior mesenteric arteriograms are negative for residual areas of extravasation or collateral supply to the mid descending colon. Note, superior mesenteric arteriogram was not performed given exhaustive evaluation on dedicated mesenteric arteriogram performed 3 days prior (01/09/2021). IMPRESSION: Technically successful percutaneous coil embolization of distal arcades arising from both the superior and inferior divisions of the left colic artery supplying the ill-defined area of intraluminal contrast extravasation involving the mid descending colon compatible with the findings seen on preceding CTA. PLAN: - The patient is to  remain flat for 4 hours with right leg straight (until at least 0730). - The patient will continue to experience several additional bloody bowel movements and may continue to require additional resuscitation (as the patient was bleeding both before and during the procedure), however ultimately I am hopeful she will stabilize in the coming days. Electronically Signed   By: Sandi Mariscal M.D.   On: 01/12/2021 07:24   IR Angiogram Selective Each Additional Vessel  Result Date: 01/12/2021 INDICATION: Recurrent lower GI bleeding. Positive CTA. Please perform mesenteric arteriogram with potential embolization. EXAM: 1. ULTRASOUND GUIDANCE FOR ARTERIAL ACCESS 2. SELECTIVE INFERIOR MESENTERIC ARTERIOGRAM 3. SUB SELECTIVE LEFT COLIC ARTERIOGRAM 4. SUB SELECTIVE DISTAL ARCADE DIVISIONS OF THE LEFT COLIC ARTERY AND PERCUTANEOUS COIL EMBOLIZATION COMPARISON:  CTA abdomen and pelvis - 01/11/2021 Mesenteric arteriogram - 01/09/2021 Nuclear medicine tagged red blood cell bleeding scan - 01/08/2021 CT abdomen and pelvis - 01/08/2021 MEDICATIONS:  None ANESTHESIA/SEDATION: Moderate (conscious) sedation was employed during this procedure. A total of Versed 0.5 mg was administered intravenously. Moderate Sedation Time: 60 minutes. The patient's level of consciousness and vital signs were monitored continuously by radiology nursing throughout the procedure under my direct supervision. CONTRAST:  45 cc Omnipaque 300 FLUOROSCOPY TIME:  19 minutes (810 mGy) COMPLICATIONS: SIR Level A - No therapy, no consequence. Procedure complicated by development of a small hematoma at the right groin access site. PROCEDURE: Informed consent was obtained from the patient following explanation of the procedure, risks, benefits and alternatives. All questions were addressed. A time out was performed prior to the initiation of the procedure. Maximal barrier sterile technique utilized including caps, mask, sterile gowns, sterile gloves, large sterile drape, hand hygiene, and Betadine prep. The right femoral head was marked fluoroscopically. Under sterile conditions and local anesthesia, the right common femoral artery access was performed with a micropuncture needle. Under direct ultrasound guidance, the right common femoral was accessed with a micropuncture kit. An ultrasound image was saved for documentation purposes. This allowed for placement of a 5-French vascular sheath. A limited arteriogram was performed through the side arm of the sheath confirming appropriate access within the right common femoral artery. Over a Bentson wire, a Mickelson catheter was advanced the caudal aspect of the thoracic aorta where was reformed, back bled and flushed. The Mickelson catheter was then utilized to select the inferior mesenteric artery and a selective superior mesenteric arteriogram was performed. With the use of a fathom 14 microwire, a regular Renegade microcatheter was advanced to the level of the left colic artery and a selective left colic arteriogram was performed. The  microcatheter was then utilized to select the inferior division of the left colic artery and a sub selective arteriogram was performed. The microcatheter was then advanced to select the distal arcade supplying the ill-defined area of extravasation involving the mid descending colon. Sub selective injection confirmed appropriate positioning and the vessel was percutaneously coil embolized with two overlapping 2 mm diameter interlock coils. The microcatheter was retracted to the level the left colic artery and a post embolization left colic arteriogram was performed. The microcatheter was then advanced to select the distal arcade felt to potentially be supplying additional collateral supply to the ill-defined area of active extravasation involving the mid descending colon. Sub selective injection confirmed appropriate positioning and the vessel was percutaneously coil embolized with two overlapping 2 mm diameter interlock coils. The microcatheter was then retracted to the to the level pelvic artery and a selective left colic arteriogram was performed. Microcatheter was  removed and a post embolization arteriogram was performed at the level of the IMA Images were reviewed and the procedure was terminated. All wires, catheters and sheaths were removed from the patient. Hemostasis was achieved at the right groin access site with manual compression. Procedure was complicated by development of a small hematoma at the right groin access site. The patient otherwise tolerated the procedure well without immediate post procedural complication. FINDINGS: Selective inferior mesenteric arteriogram demonstrates conventional branching pattern with ill-defined area of extravasation involving the mid descending colon compatible with findings on preceding CTA. Selective left colic arteriogram redemonstrates the ill-defined area of extravasation within the mid descending colon supplied predominantly via the inferior division of the left  colic artery with potential collateral supply from the superior division. Sub selective arteriogram of the inferior division the left colic artery confirms supply to the ill-defined area of extravasation and as such the supplying distal arcade was percutaneously coil embolized. Sub selective arteriogram of the superior division of the left colic artery suggests additional collateral supply to the ill-defined area of extravasation and as such the supplying distal arcade was percutaneously coil embolized. Completion left colic and inferior mesenteric arteriograms are negative for residual areas of extravasation or collateral supply to the mid descending colon. Note, superior mesenteric arteriogram was not performed given exhaustive evaluation on dedicated mesenteric arteriogram performed 3 days prior (01/09/2021). IMPRESSION: Technically successful percutaneous coil embolization of distal arcades arising from both the superior and inferior divisions of the left colic artery supplying the ill-defined area of intraluminal contrast extravasation involving the mid descending colon compatible with the findings seen on preceding CTA. PLAN: - The patient is to remain flat for 4 hours with right leg straight (until at least 0730). - The patient will continue to experience several additional bloody bowel movements and may continue to require additional resuscitation (as the patient was bleeding both before and during the procedure), however ultimately I am hopeful she will stabilize in the coming days. Electronically Signed   By: Sandi Mariscal M.D.   On: 01/12/2021 07:24   IR US Guide Vasc Access Right  Result Date: 01/12/2021 INDICATION: Recurrent lower GI bleeding. Positive CTA. Please perform mesenteric arteriogram with potential embolization. EXAM: 1. ULTRASOUND GUIDANCE FOR ARTERIAL ACCESS 2. SELECTIVE INFERIOR MESENTERIC ARTERIOGRAM 3. SUB SELECTIVE LEFT COLIC ARTERIOGRAM 4. SUB SELECTIVE DISTAL ARCADE DIVISIONS OF THE  LEFT COLIC ARTERY AND PERCUTANEOUS COIL EMBOLIZATION COMPARISON:  CTA abdomen and pelvis - 01/11/2021 Mesenteric arteriogram - 01/09/2021 Nuclear medicine tagged red blood cell bleeding scan - 01/08/2021 CT abdomen and pelvis - 01/08/2021 MEDICATIONS: None ANESTHESIA/SEDATION: Moderate (conscious) sedation was employed during this procedure. A total of Versed 0.5 mg was administered intravenously. Moderate Sedation Time: 60 minutes. The patient's level of consciousness and vital signs were monitored continuously by radiology nursing throughout the procedure under my direct supervision. CONTRAST:  45 cc Omnipaque 300 FLUOROSCOPY TIME:  19 minutes (644 mGy) COMPLICATIONS: SIR Level A - No therapy, no consequence. Procedure complicated by development of a small hematoma at the right groin access site. PROCEDURE: Informed consent was obtained from the patient following explanation of the procedure, risks, benefits and alternatives. All questions were addressed. A time out was performed prior to the initiation of the procedure. Maximal barrier sterile technique utilized including caps, mask, sterile gowns, sterile gloves, large sterile drape, hand hygiene, and Betadine prep. The right femoral head was marked fluoroscopically. Under sterile conditions and local anesthesia, the right common femoral artery access was performed with a micropuncture  needle. Under direct ultrasound guidance, the right common femoral was accessed with a micropuncture kit. An ultrasound image was saved for documentation purposes. This allowed for placement of a 5-French vascular sheath. A limited arteriogram was performed through the side arm of the sheath confirming appropriate access within the right common femoral artery. Over a Bentson wire, a Mickelson catheter was advanced the caudal aspect of the thoracic aorta where was reformed, back bled and flushed. The Mickelson catheter was then utilized to select the inferior mesenteric artery and  a selective superior mesenteric arteriogram was performed. With the use of a fathom 14 microwire, a regular Renegade microcatheter was advanced to the level of the left colic artery and a selective left colic arteriogram was performed. The microcatheter was then utilized to select the inferior division of the left colic artery and a sub selective arteriogram was performed. The microcatheter was then advanced to select the distal arcade supplying the ill-defined area of extravasation involving the mid descending colon. Sub selective injection confirmed appropriate positioning and the vessel was percutaneously coil embolized with two overlapping 2 mm diameter interlock coils. The microcatheter was retracted to the level the left colic artery and a post embolization left colic arteriogram was performed. The microcatheter was then advanced to select the distal arcade felt to potentially be supplying additional collateral supply to the ill-defined area of active extravasation involving the mid descending colon. Sub selective injection confirmed appropriate positioning and the vessel was percutaneously coil embolized with two overlapping 2 mm diameter interlock coils. The microcatheter was then retracted to the to the level pelvic artery and a selective left colic arteriogram was performed. Microcatheter was removed and a post embolization arteriogram was performed at the level of the IMA Images were reviewed and the procedure was terminated. All wires, catheters and sheaths were removed from the patient. Hemostasis was achieved at the right groin access site with manual compression. Procedure was complicated by development of a small hematoma at the right groin access site. The patient otherwise tolerated the procedure well without immediate post procedural complication. FINDINGS: Selective inferior mesenteric arteriogram demonstrates conventional branching pattern with ill-defined area of extravasation involving the mid  descending colon compatible with findings on preceding CTA. Selective left colic arteriogram redemonstrates the ill-defined area of extravasation within the mid descending colon supplied predominantly via the inferior division of the left colic artery with potential collateral supply from the superior division. Sub selective arteriogram of the inferior division the left colic artery confirms supply to the ill-defined area of extravasation and as such the supplying distal arcade was percutaneously coil embolized. Sub selective arteriogram of the superior division of the left colic artery suggests additional collateral supply to the ill-defined area of extravasation and as such the supplying distal arcade was percutaneously coil embolized. Completion left colic and inferior mesenteric arteriograms are negative for residual areas of extravasation or collateral supply to the mid descending colon. Note, superior mesenteric arteriogram was not performed given exhaustive evaluation on dedicated mesenteric arteriogram performed 3 days prior (01/09/2021). IMPRESSION: Technically successful percutaneous coil embolization of distal arcades arising from both the superior and inferior divisions of the left colic artery supplying the ill-defined area of intraluminal contrast extravasation involving the mid descending colon compatible with the findings seen on preceding CTA. PLAN: - The patient is to remain flat for 4 hours with right leg straight (until at least 0730). - The patient will continue to experience several additional bloody bowel movements and may continue to require additional resuscitation (  as the patient was bleeding both before and during the procedure), however ultimately I am hopeful she will stabilize in the coming days. Electronically Signed   By: Sandi Mariscal M.D.   On: 01/12/2021 07:24   IR EMBO ARTERIAL NOT HEMORR HEMANG INC GUIDE ROADMAPPING  Result Date: 01/12/2021 INDICATION: Recurrent lower GI  bleeding. Positive CTA. Please perform mesenteric arteriogram with potential embolization. EXAM: 1. ULTRASOUND GUIDANCE FOR ARTERIAL ACCESS 2. SELECTIVE INFERIOR MESENTERIC ARTERIOGRAM 3. SUB SELECTIVE LEFT COLIC ARTERIOGRAM 4. SUB SELECTIVE DISTAL ARCADE DIVISIONS OF THE LEFT COLIC ARTERY AND PERCUTANEOUS COIL EMBOLIZATION COMPARISON:  CTA abdomen and pelvis - 01/11/2021 Mesenteric arteriogram - 01/09/2021 Nuclear medicine tagged red blood cell bleeding scan - 01/08/2021 CT abdomen and pelvis - 01/08/2021 MEDICATIONS: None ANESTHESIA/SEDATION: Moderate (conscious) sedation was employed during this procedure. A total of Versed 0.5 mg was administered intravenously. Moderate Sedation Time: 60 minutes. The patient's level of consciousness and vital signs were monitored continuously by radiology nursing throughout the procedure under my direct supervision. CONTRAST:  45 cc Omnipaque 300 FLUOROSCOPY TIME:  19 minutes (287 mGy) COMPLICATIONS: SIR Level A - No therapy, no consequence. Procedure complicated by development of a small hematoma at the right groin access site. PROCEDURE: Informed consent was obtained from the patient following explanation of the procedure, risks, benefits and alternatives. All questions were addressed. A time out was performed prior to the initiation of the procedure. Maximal barrier sterile technique utilized including caps, mask, sterile gowns, sterile gloves, large sterile drape, hand hygiene, and Betadine prep. The right femoral head was marked fluoroscopically. Under sterile conditions and local anesthesia, the right common femoral artery access was performed with a micropuncture needle. Under direct ultrasound guidance, the right common femoral was accessed with a micropuncture kit. An ultrasound image was saved for documentation purposes. This allowed for placement of a 5-French vascular sheath. A limited arteriogram was performed through the side arm of the sheath confirming  appropriate access within the right common femoral artery. Over a Bentson wire, a Mickelson catheter was advanced the caudal aspect of the thoracic aorta where was reformed, back bled and flushed. The Mickelson catheter was then utilized to select the inferior mesenteric artery and a selective superior mesenteric arteriogram was performed. With the use of a fathom 14 microwire, a regular Renegade microcatheter was advanced to the level of the left colic artery and a selective left colic arteriogram was performed. The microcatheter was then utilized to select the inferior division of the left colic artery and a sub selective arteriogram was performed. The microcatheter was then advanced to select the distal arcade supplying the ill-defined area of extravasation involving the mid descending colon. Sub selective injection confirmed appropriate positioning and the vessel was percutaneously coil embolized with two overlapping 2 mm diameter interlock coils. The microcatheter was retracted to the level the left colic artery and a post embolization left colic arteriogram was performed. The microcatheter was then advanced to select the distal arcade felt to potentially be supplying additional collateral supply to the ill-defined area of active extravasation involving the mid descending colon. Sub selective injection confirmed appropriate positioning and the vessel was percutaneously coil embolized with two overlapping 2 mm diameter interlock coils. The microcatheter was then retracted to the to the level pelvic artery and a selective left colic arteriogram was performed. Microcatheter was removed and a post embolization arteriogram was performed at the level of the IMA Images were reviewed and the procedure was terminated. All wires, catheters and sheaths were removed  from the patient. Hemostasis was achieved at the right groin access site with manual compression. Procedure was complicated by development of a small hematoma  at the right groin access site. The patient otherwise tolerated the procedure well without immediate post procedural complication. FINDINGS: Selective inferior mesenteric arteriogram demonstrates conventional branching pattern with ill-defined area of extravasation involving the mid descending colon compatible with findings on preceding CTA. Selective left colic arteriogram redemonstrates the ill-defined area of extravasation within the mid descending colon supplied predominantly via the inferior division of the left colic artery with potential collateral supply from the superior division. Sub selective arteriogram of the inferior division the left colic artery confirms supply to the ill-defined area of extravasation and as such the supplying distal arcade was percutaneously coil embolized. Sub selective arteriogram of the superior division of the left colic artery suggests additional collateral supply to the ill-defined area of extravasation and as such the supplying distal arcade was percutaneously coil embolized. Completion left colic and inferior mesenteric arteriograms are negative for residual areas of extravasation or collateral supply to the mid descending colon. Note, superior mesenteric arteriogram was not performed given exhaustive evaluation on dedicated mesenteric arteriogram performed 3 days prior (01/09/2021). IMPRESSION: Technically successful percutaneous coil embolization of distal arcades arising from both the superior and inferior divisions of the left colic artery supplying the ill-defined area of intraluminal contrast extravasation involving the mid descending colon compatible with the findings seen on preceding CTA. PLAN: - The patient is to remain flat for 4 hours with right leg straight (until at least 0730). - The patient will continue to experience several additional bloody bowel movements and may continue to require additional resuscitation (as the patient was bleeding both before and  during the procedure), however ultimately I am hopeful she will stabilize in the coming days. Electronically Signed   By: Sandi Mariscal M.D.   On: 01/12/2021 07:24   CT Angio Abd/Pel W and/or Wo Contrast  Result Date: 01/11/2021 CLINICAL DATA:  Gastrointestinal bleed.  bright red blood. EXAM: CTA ABDOMEN AND PELVIS WITHOUT AND WITH CONTRAST TECHNIQUE: Multidetector CT imaging of the abdomen and pelvis was performed using the standard protocol during bolus administration of intravenous contrast. Multiplanar reconstructed images and MIPs were obtained and reviewed to evaluate the vascular anatomy. CONTRAST:  185m OMNIPAQUE IOHEXOL 350 MG/ML SOLN COMPARISON:  Nuclear medicine gastrointestinal bleeding scan 01/08/2021, CT abdomen pelvis 01/08/2021 FINDINGS: VASCULAR Aorta: Severe atherosclerotic plaque. Normal caliber aorta without aneurysm, dissection, vasculitis or significant stenosis. Celiac: Patent without evidence of aneurysm, dissection, vasculitis or significant stenosis. SMA: Patent without evidence of aneurysm, dissection, vasculitis or significant stenosis. Renals: Mild atherosclerotic plaque of the origins. Both renal arteries are patent without evidence of aneurysm, dissection, vasculitis, fibromuscular dysplasia or significant stenosis. IMA: Patent without evidence of aneurysm, dissection, vasculitis or significant stenosis. Inflow: Severe atherosclerotic plaque. Patent without evidence of aneurysm, dissection, vasculitis or significant stenosis. Proximal Outflow: At least moderate atherosclerotic plaque. Bilateral common femoral and visualized portions of the superficial and profunda femoral arteries are patent without evidence of aneurysm, dissection, vasculitis or significant stenosis. Veins: The hepatic, portal, splenic, superior mesenteric veins are patent. NON-VASCULAR Lower chest: Interval development of a 1.6 x 1.3 cm right lower lobe pulmonary nodular like consolidation likely represents  atelectasis (not visualized on CT 2 days ago. Subsegmental atelectasis within bilateral lower lobes. Trace left pleural effusion. Hepatobiliary: No focal liver abnormality. No gallstones, gallbladder wall thickening, or pericholecystic fluid. No biliary dilatation. Pancreas: No focal lesion. Normal pancreatic contour.  No surrounding inflammatory changes. No main pancreatic ductal dilatation. Spleen: Normal in size without focal abnormality. Adrenals/Urinary Tract: No adrenal nodule bilaterally. Bilateral kidneys enhance symmetrically. Subcentimeter hypodensities within the kidneys are too small to characterize. No hydronephrosis. No hydroureter. The urinary bladder is unremarkable. Stomach/Bowel: Left distal descending colon/sigmoid colon reanastomosis. Layering hyperdensity within the mid to distal descending colon is noted to slightly increase in density on the portal venous phase but is also not visualized on noncontrast study. Associated slightly hyperdense material within the colonic lumen distally. The colon is noted to be fluid-filled past the splenic flexure. No bowel wall thickening or dilatation. The appendix not definitely identified. Lymphatic: No lymphadenopathy. Reproductive: Status post hysterectomy. No adnexal masses. Other: No intraperitoneal free fluid. No intraperitoneal free gas. No organized fluid collection. Musculoskeletal: No abdominal wall hernia or abnormality. No suspicious lytic or blastic osseous lesions. No acute displaced fracture. Multilevel degenerative changes of the spine. 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 IMPRESSION: VASCULAR Aortic Atherosclerosis (ICD10-I70.0). NON-VASCULAR Mid to distal descending colon gastrointestinal hemorrhage that is located proximal to the colonic anastomosis. Electronically Signed   By: Iven Finn M.D.   On: 01/11/2021 22:36    Impression / Plan:   Impression: 1.  Recurrent GI  (diverticular) bleed: Recent admission and discharged 2/26 with bleeding when starting on 2/27, hemoglobin dropped 2 points from time of discharge, admitted with positive CTA and coil embolization yesterday, hemoglobin 10.9 on 2/27--> 8.7 on 2/27--> 1 unit prbcs-->7.7 on 2/28 2.  Acute blood loss anemia: With above 3.  COPD  Plan: 1.  Hopefully the intervention yesterday was successful and hemoglobin will stabilize with minimal further bloody bowel movements.  We will need to observe the patient at least for the next 24 to 48 hours. 2.  Okay with clear liquid diet for now 3.  Continue to monitor hemoglobin with transfusion as needed less than 7 4.  Please await any further recommendations from Dr. Loletha Carrow later today.  Thank you for your kind consultation, we will continue to follow.  Lavone Nian [facility]  01/12/2021, 8:37 AM   I have reviewed the entire case in detail with the above APP and discussed the plan in detail.  Therefore, I agree with the diagnoses recorded above. In addition,  I have personally interviewed and examined the patient and have personally reviewed any abdominal/pelvic CT scan images.  My additional thoughts are as follows:  Recurrent left-sided diverticular bleed that was quite fortunately amenable to IR directed therapy overnight. She had a dollar coin sized clot on the pad when I saw her, and that is the only bleeding she has had since the procedure overnight.  That is very encouraging that the procedure was able to get good control of this.  She is lost a lot of blood recently and has severe acute blood loss anemia.  Not yet at threshold for transfusion, but would benefit from a dose of IV iron.  If she is doing well by the time we see her tomorrow with no recurrence of bleeding, then we will probably start regular diet and consider discharge in the next day or 2.   [name] Office:[phone]

## 2021-01-12 NOTE — Evaluation (Signed)
Physical Therapy Evaluation Patient Details Name: Kathleen Pope MRN: [id number] DOB: 1933/09/15 Today's Date: 01/12/2021   History of Present Illness  Kathleen Pope is a 87 y.o. 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  Clinical Impression  Pt admitted with above diagnosis. PTA, pt independent with mobility, denies falls, reports retired daughter lives next door and calls her multiple times per day. Pt currently slightly unsteady when ambulating without AD, reaches for furniture and walls to steady self with occasional lateral protective stepping. Plan to gait train with SPC, recommend RW if not steady with SPC. Pt currently with functional limitations due to the deficits listed below (see PT Problem List). Pt will benefit from skilled PT to increase their independence and safety with mobility to allow discharge to the venue listed below.       Follow Up Recommendations No PT follow up    Equipment Recommendations  None recommended by PT (possibly RW pending SPC gait training)    Recommendations for Other Services       Precautions / Restrictions Precautions Precautions: Fall Restrictions Weight Bearing Restrictions: No      Mobility  Bed Mobility Overal bed mobility: Modified Independent   Transfers Overall transfer level: Needs assistance Equipment used: None Transfers: Sit to/from Stand Sit to Stand: Supervision  General transfer comment: SUPV for safety, BLE lightly against bed to power up, pt holds onto IV pole upon rising  Ambulation/Gait Ambulation/Gait assistance: Min guard Gait Distance (Feet): 80 Feet Assistive device: IV Pole;None Gait Pattern/deviations: Step-through pattern;Decreased stride length;Wide base of support Gait velocity: decreased   General Gait Details: pt ambulates with wide BOS  using IV pole to steady self, pt ambulates without AD reaching out for wall and furniture to steady self, occasional unsteadiness but able to recover with lateral protective stepping  Stairs            Wheelchair Mobility    Modified Rankin (Stroke Patients Only)       Balance Overall balance assessment: Needs assistance  Standing balance support: During functional activity Standing balance-Leahy Scale: Fair Standing balance comment: improved balance with UE support            Pertinent Vitals/Pain Pain Assessment: No/denies pain    Home Living Family/patient expects to be discharged to:: Private residence Living Arrangements: Alone Available Help at Discharge: Family;Available 24 hours/day Type of Home: House Home Access: Stairs to enter Entrance Stairs-Rails: None Entrance Stairs-Number of Steps: 1 Home Layout: One level Home Equipment: Shower seat;Cane - single point      Prior Function Level of Independence: Independent         Comments: Pt reports being independent with ambulation and ADLs, no AD use, denies falls, retired daughter lives next door and calls pt multiple times a day.     Hand Dominance        Extremity/Trunk Assessment   Upper Extremity Assessment Upper Extremity Assessment: Overall WFL for tasks assessed    Lower Extremity Assessment Lower Extremity Assessment: Overall WFL for tasks assessed    Cervical / Trunk Assessment Cervical / Trunk Assessment: Normal  Communication   Communication: No difficulties  Cognition Arousal/Alertness: Awake/alert Behavior During Therapy: WFL for tasks assessed/performed Overall Cognitive Status: Within Functional Limits for tasks assessed           General Comments  Exercises     Assessment/Plan    PT Assessment Patient needs continued PT services  PT Problem List Decreased activity tolerance;Decreased balance;Decreased knowledge of use of DME       PT Treatment  Interventions DME instruction;Gait training;Functional mobility training;Therapeutic activities;Therapeutic exercise;Balance training;Patient/family education    PT Goals (Current goals can be found in the Care Plan section)  Acute Rehab PT Goals Patient Stated Goal: return home PT Goal Formulation: With patient Time For Goal Achievement: 01/26/21 Potential to Achieve Goals: Good    Frequency Min 3X/week   Barriers to discharge        Co-evaluation               AM-PAC PT "6 Clicks" Mobility  Outcome Measure Help needed turning from your back to your side while in a flat bed without using bedrails?: None Help needed moving from lying on your back to sitting on the side of a flat bed without using bedrails?: None Help needed moving to and from a bed to a chair (including a wheelchair)?: None Help needed standing up from a chair using your arms (e.g., wheelchair or bedside chair)?: None Help needed to walk in hospital room?: A Little Help needed climbing 3-5 steps with a railing? : A Little 6 Click Score: 22    End of Session   Activity Tolerance: Patient tolerated treatment well Patient left: in bed;with call bell/phone within reach Nurse Communication: Mobility status PT Visit Diagnosis: Unsteadiness on feet (R26.81);Other abnormalities of gait and mobility (R26.89)    Time: 3086-5784 PT Time Calculation (min) (ACUTE ONLY): 15 min   Charges:   PT Evaluation $PT Eval Low Complexity: 1 Low           Tori Kamia Insalaco PT, DPT 01/12/21, 3:58 PM

## 2021-01-13 DIAGNOSIS — K5520 Angiodysplasia of colon without hemorrhage: Secondary | ICD-10-CM | POA: Diagnosis not present

## 2021-01-13 DIAGNOSIS — K625 Hemorrhage of anus and rectum: Secondary | ICD-10-CM | POA: Diagnosis not present

## 2021-01-13 DIAGNOSIS — D62 Acute posthemorrhagic anemia: Secondary | ICD-10-CM | POA: Diagnosis not present

## 2021-01-13 DIAGNOSIS — K5731 Diverticulosis of large intestine without perforation or abscess with bleeding: Secondary | ICD-10-CM | POA: Diagnosis not present

## 2021-01-13 DIAGNOSIS — K922 Gastrointestinal hemorrhage, unspecified: Secondary | ICD-10-CM | POA: Diagnosis not present

## 2021-01-13 LAB — CBC WITH DIFFERENTIAL/PLATELET
Abs Immature Granulocytes: 0.1 10*3/uL — ABNORMAL HIGH (ref 0.00–0.07)
Basophils Absolute: 0 10*3/uL (ref 0.0–0.1)
Basophils Relative: 0 %
Eosinophils Absolute: 0.1 10*3/uL (ref 0.0–0.5)
Eosinophils Relative: 0 %
HCT: 20.2 % — ABNORMAL LOW (ref 36.0–46.0)
Hemoglobin: 7 g/dL — ABNORMAL LOW (ref 12.0–15.0)
Immature Granulocytes: 1 %
Lymphocytes Relative: 8 %
Lymphs Abs: 1.4 10*3/uL (ref 0.7–4.0)
MCH: 33.2 pg (ref 26.0–34.0)
MCHC: 34.7 g/dL (ref 30.0–36.0)
MCV: 95.7 fL (ref 80.0–100.0)
Monocytes Absolute: 2 10*3/uL — ABNORMAL HIGH (ref 0.1–1.0)
Monocytes Relative: 11 %
Neutro Abs: 14.7 10*3/uL — ABNORMAL HIGH (ref 1.7–7.7)
Neutrophils Relative %: 80 %
Platelets: 198 10*3/uL (ref 150–400)
RBC: 2.11 MIL/uL — ABNORMAL LOW (ref 3.87–5.11)
RDW: 18 % — ABNORMAL HIGH (ref 11.5–15.5)
WBC: 18.2 10*3/uL — ABNORMAL HIGH (ref 4.0–10.5)
nRBC: 0 % (ref 0.0–0.2)

## 2021-01-13 LAB — COMPREHENSIVE METABOLIC PANEL
ALT: 11 U/L (ref 0–44)
AST: 18 U/L (ref 15–41)
Albumin: 2.6 g/dL — ABNORMAL LOW (ref 3.5–5.0)
Alkaline Phosphatase: 39 U/L (ref 38–126)
Anion gap: 8 (ref 5–15)
BUN: 12 mg/dL (ref 8–23)
CO2: 25 mmol/L (ref 22–32)
Calcium: 8 mg/dL — ABNORMAL LOW (ref 8.9–10.3)
Chloride: 107 mmol/L (ref 98–111)
Creatinine, Ser: 0.74 mg/dL (ref 0.44–1.00)
GFR, Estimated: >60 mL/min (ref >60)
Glucose, Bld: 121 mg/dL — ABNORMAL HIGH (ref 70–99)
Potassium: 4.6 mmol/L (ref 3.5–5.1)
Sodium: 140 mmol/L (ref 135–145)
Total Bilirubin: 0.8 mg/dL (ref 0.3–1.2)
Total Protein: 4.7 g/dL — ABNORMAL LOW (ref 6.5–8.1)

## 2021-01-13 LAB — HEMOGLOBIN AND HEMATOCRIT, BLOOD
HCT: 24.4 % — ABNORMAL LOW (ref 36.0–46.0)
Hemoglobin: 8.2 g/dL — ABNORMAL LOW (ref 12.0–15.0)

## 2021-01-13 LAB — PHOSPHORUS: Phosphorus: 2.6 mg/dL (ref 2.5–4.6)

## 2021-01-13 LAB — SURGICAL PATHOLOGY

## 2021-01-13 LAB — PREPARE RBC (CROSSMATCH)

## 2021-01-13 LAB — MAGNESIUM: Magnesium: 1.8 mg/dL (ref 1.7–2.4)

## 2021-01-13 MED ORDER — SODIUM CHLORIDE 0.9% IV SOLUTION: Freq: Once | INTRAVENOUS | Status: AC

## 2021-01-13 MED ORDER — SODIUM CHLORIDE 0.9 % IV BOLUS
250.0000 mL | Freq: Once | INTRAVENOUS | Status: AC
  Administered 2021-01-13: 250 mL via INTRAVENOUS

## 2021-01-13 MED ORDER — ACETAMINOPHEN 325 MG PO TABS
650.0000 mg | ORAL_TABLET | ORAL | Status: AC
  Administered 2021-01-13: 650 mg via ORAL

## 2021-01-13 MED ORDER — ACETAMINOPHEN 325 MG PO TABS
650.0000 mg | ORAL_TABLET | Freq: Four times a day (QID) | ORAL | Status: DC | PRN
  Administered 2021-01-13: 650 mg via ORAL
  Filled 2021-01-13: qty 2

## 2021-01-13 NOTE — Progress Notes (Signed)
Order given for tylenol and to continue blood transfusion.

## 2021-01-13 NOTE — Progress Notes (Addendum)
[facility] Progress Note  CC:  GI bleed   Subjective: She complains of having mild soreness across the middle of her abdomen since her coil embolization procedure yesterday. No significant abdominal pain. No N/V. She is tolerating a clear liquid diet. She passes a small soft black stool this morning without obvious red blood or clots. She stated her stool is usually black in color since taking Ferrous Sulfate.   No chest pain or dyspnea today  Objective:  Vital signs in last 24 hours: Temp:  [97.9 F (36.6 C)-100.3 F (37.9 C)] 100.3 F (37.9 C) (03/01 0401) Pulse Rate:  [76-99] 99 (03/01 0401) Resp:  [16-20] 20 (03/01 0401) BP: (101-136)/(56-82) 101/71 (03/01 0401) SpO2:  [93 %-99 %] 93 % (03/01 0401) Last BM Date: 01/10/21 General:   Alert 87 year old female in NAD. Sitting up in the recliner.  Heart: RRR, 3/6 systolic murmur.  Pulm:  Breath sounds clear throughout.  Abdomen: Soft, protuberant. Very mild tenderness throughout the central abdomen without rebound our guarding. + BX x 4 quads.  Extremities:  Without edema. Neurologic:  Alert and  oriented x4;  grossly normal neurologically. Psych:  Alert and cooperative. Normal mood and affect.  Intake/Output from previous day: 02/28 0701 - 03/01 0700 In: 1789.8 [P.O.:712; I.V.:1077.8] Out: 200 [Urine:200] Intake/Output this shift: Total I/O In: 236 [P.O.:236] Out: -   Lab Results: Recent Labs    01/12/21 1347 01/12/21 1932 01/13/21 0347  WBC 14.8* 16.0* 18.2*  HGB 7.9* 7.3* 7.0*  HCT 22.9* 21.6* 20.2*  PLT 203 191 198   BMET Recent Labs    01/11/21 2015 01/12/21 0700 01/13/21 0347  NA 143 142 140  K 3.6 3.3* 4.6  CL 106 110 107  CO2 27 23 25   GLUCOSE 200* 107* 121*  BUN 16 16 12   CREATININE 0.85 0.59 0.74  CALCIUM 8.4* 7.3* 8.0*   LFT Recent Labs    01/13/21 0347  PROT 4.7*  ALBUMIN 2.6*  AST 18  ALT 11  ALKPHOS 39  BILITOT 0.8   PT/INR No results for input(s): LABPROT,  INR in the last 72 hours. Hepatitis Panel No results for input(s): HEPBSAG, HCVAB, HEPAIGM, HEPBIGM in the last 72 hours.  IR Angiogram Visceral Selective  Result Date: 01/12/2021 INDICATION: Recurrent lower GI bleeding. Positive CTA. Please perform mesenteric arteriogram with potential embolization. EXAM: 1. ULTRASOUND GUIDANCE FOR ARTERIAL ACCESS 2. SELECTIVE INFERIOR MESENTERIC ARTERIOGRAM 3. SUB SELECTIVE LEFT COLIC ARTERIOGRAM 4. SUB SELECTIVE DISTAL ARCADE DIVISIONS OF THE LEFT COLIC ARTERY AND PERCUTANEOUS COIL EMBOLIZATION COMPARISON:  CTA abdomen and pelvis - 01/11/2021 Mesenteric arteriogram - 01/09/2021 Nuclear medicine tagged red blood cell bleeding scan - 01/08/2021 CT abdomen and pelvis - 01/08/2021 MEDICATIONS: None ANESTHESIA/SEDATION: Moderate (conscious) sedation was employed during this procedure. A total of Versed 0.5 mg was administered intravenously. Moderate Sedation Time: 60 minutes. The patient's level of consciousness and vital signs were monitored continuously by radiology nursing throughout the procedure under my direct supervision. CONTRAST:  45 cc Omnipaque 300 FLUOROSCOPY TIME:  19 minutes (423 mGy) COMPLICATIONS: SIR Level A - No therapy, no consequence. Procedure complicated by development of a small hematoma at the right groin access site. PROCEDURE: Informed consent was obtained from the patient following explanation of the procedure, risks, benefits and alternatives. All questions were addressed. A time out was performed prior to the initiation of the procedure. Maximal barrier sterile technique utilized including caps, mask, sterile gowns, sterile gloves, large sterile drape, hand hygiene,  and Betadine prep. The right femoral head was marked fluoroscopically. Under sterile conditions and local anesthesia, the right common femoral artery access was performed with a micropuncture needle. Under direct ultrasound guidance, the right common femoral was accessed with a  micropuncture kit. An ultrasound image was saved for documentation purposes. This allowed for placement of a 5-French vascular sheath. A limited arteriogram was performed through the side arm of the sheath confirming appropriate access within the right common femoral artery. Over a Bentson wire, a Mickelson catheter was advanced the caudal aspect of the thoracic aorta where was reformed, back bled and flushed. The Mickelson catheter was then utilized to select the inferior mesenteric artery and a selective superior mesenteric arteriogram was performed. With the use of a fathom 14 microwire, a regular Renegade microcatheter was advanced to the level of the left colic artery and a selective left colic arteriogram was performed. The microcatheter was then utilized to select the inferior division of the left colic artery and a sub selective arteriogram was performed. The microcatheter was then advanced to select the distal arcade supplying the ill-defined area of extravasation involving the mid descending colon. Sub selective injection confirmed appropriate positioning and the vessel was percutaneously coil embolized with two overlapping 2 mm diameter interlock coils. The microcatheter was retracted to the level the left colic artery and a post embolization left colic arteriogram was performed. The microcatheter was then advanced to select the distal arcade felt to potentially be supplying additional collateral supply to the ill-defined area of active extravasation involving the mid descending colon. Sub selective injection confirmed appropriate positioning and the vessel was percutaneously coil embolized with two overlapping 2 mm diameter interlock coils. The microcatheter was then retracted to the to the level pelvic artery and a selective left colic arteriogram was performed. Microcatheter was removed and a post embolization arteriogram was performed at the level of the IMA Images were reviewed and the procedure was  terminated. All wires, catheters and sheaths were removed from the patient. Hemostasis was achieved at the right groin access site with manual compression. Procedure was complicated by development of a small hematoma at the right groin access site. The patient otherwise tolerated the procedure well without immediate post procedural complication. FINDINGS: Selective inferior mesenteric arteriogram demonstrates conventional branching pattern with ill-defined area of extravasation involving the mid descending colon compatible with findings on preceding CTA. Selective left colic arteriogram redemonstrates the ill-defined area of extravasation within the mid descending colon supplied predominantly via the inferior division of the left colic artery with potential collateral supply from the superior division. Sub selective arteriogram of the inferior division the left colic artery confirms supply to the ill-defined area of extravasation and as such the supplying distal arcade was percutaneously coil embolized. Sub selective arteriogram of the superior division of the left colic artery suggests additional collateral supply to the ill-defined area of extravasation and as such the supplying distal arcade was percutaneously coil embolized. Completion left colic and inferior mesenteric arteriograms are negative for residual areas of extravasation or collateral supply to the mid descending colon. Note, superior mesenteric arteriogram was not performed given exhaustive evaluation on dedicated mesenteric arteriogram performed 3 days prior (01/09/2021). IMPRESSION: Technically successful percutaneous coil embolization of distal arcades arising from both the superior and inferior divisions of the left colic artery supplying the ill-defined area of intraluminal contrast extravasation involving the mid descending colon compatible with the findings seen on preceding CTA. PLAN: - The patient is to remain flat for 4 hours  with right leg  straight (until at least 0730). - The patient will continue to experience several additional bloody bowel movements and may continue to require additional resuscitation (as the patient was bleeding both before and during the procedure), however ultimately I am hopeful she will stabilize in the coming days. Electronically Signed   By: Sandi Mariscal M.D.   On: 01/12/2021 07:24   IR Angiogram Selective Each Additional Vessel  Result Date: 01/12/2021 INDICATION: Recurrent lower GI bleeding. Positive CTA. Please perform mesenteric arteriogram with potential embolization. EXAM: 1. ULTRASOUND GUIDANCE FOR ARTERIAL ACCESS 2. SELECTIVE INFERIOR MESENTERIC ARTERIOGRAM 3. SUB SELECTIVE LEFT COLIC ARTERIOGRAM 4. SUB SELECTIVE DISTAL ARCADE DIVISIONS OF THE LEFT COLIC ARTERY AND PERCUTANEOUS COIL EMBOLIZATION COMPARISON:  CTA abdomen and pelvis - 01/11/2021 Mesenteric arteriogram - 01/09/2021 Nuclear medicine tagged red blood cell bleeding scan - 01/08/2021 CT abdomen and pelvis - 01/08/2021 MEDICATIONS: None ANESTHESIA/SEDATION: Moderate (conscious) sedation was employed during this procedure. A total of Versed 0.5 mg was administered intravenously. Moderate Sedation Time: 60 minutes. The patient's level of consciousness and vital signs were monitored continuously by radiology nursing throughout the procedure under my direct supervision. CONTRAST:  45 cc Omnipaque 300 FLUOROSCOPY TIME:  19 minutes (016 mGy) COMPLICATIONS: SIR Level A - No therapy, no consequence. Procedure complicated by development of a small hematoma at the right groin access site. PROCEDURE: Informed consent was obtained from the patient following explanation of the procedure, risks, benefits and alternatives. All questions were addressed. A time out was performed prior to the initiation of the procedure. Maximal barrier sterile technique utilized including caps, mask, sterile gowns, sterile gloves, large sterile drape, hand hygiene, and Betadine prep. The  right femoral head was marked fluoroscopically. Under sterile conditions and local anesthesia, the right common femoral artery access was performed with a micropuncture needle. Under direct ultrasound guidance, the right common femoral was accessed with a micropuncture kit. An ultrasound image was saved for documentation purposes. This allowed for placement of a 5-French vascular sheath. A limited arteriogram was performed through the side arm of the sheath confirming appropriate access within the right common femoral artery. Over a Bentson wire, a Mickelson catheter was advanced the caudal aspect of the thoracic aorta where was reformed, back bled and flushed. The Mickelson catheter was then utilized to select the inferior mesenteric artery and a selective superior mesenteric arteriogram was performed. With the use of a fathom 14 microwire, a regular Renegade microcatheter was advanced to the level of the left colic artery and a selective left colic arteriogram was performed. The microcatheter was then utilized to select the inferior division of the left colic artery and a sub selective arteriogram was performed. The microcatheter was then advanced to select the distal arcade supplying the ill-defined area of extravasation involving the mid descending colon. Sub selective injection confirmed appropriate positioning and the vessel was percutaneously coil embolized with two overlapping 2 mm diameter interlock coils. The microcatheter was retracted to the level the left colic artery and a post embolization left colic arteriogram was performed. The microcatheter was then advanced to select the distal arcade felt to potentially be supplying additional collateral supply to the ill-defined area of active extravasation involving the mid descending colon. Sub selective injection confirmed appropriate positioning and the vessel was percutaneously coil embolized with two overlapping 2 mm diameter interlock coils. The  microcatheter was then retracted to the to the level pelvic artery and a selective left colic arteriogram was performed. Microcatheter was removed and a post  embolization arteriogram was performed at the level of the IMA Images were reviewed and the procedure was terminated. All wires, catheters and sheaths were removed from the patient. Hemostasis was achieved at the right groin access site with manual compression. Procedure was complicated by development of a small hematoma at the right groin access site. The patient otherwise tolerated the procedure well without immediate post procedural complication. FINDINGS: Selective inferior mesenteric arteriogram demonstrates conventional branching pattern with ill-defined area of extravasation involving the mid descending colon compatible with findings on preceding CTA. Selective left colic arteriogram redemonstrates the ill-defined area of extravasation within the mid descending colon supplied predominantly via the inferior division of the left colic artery with potential collateral supply from the superior division. Sub selective arteriogram of the inferior division the left colic artery confirms supply to the ill-defined area of extravasation and as such the supplying distal arcade was percutaneously coil embolized. Sub selective arteriogram of the superior division of the left colic artery suggests additional collateral supply to the ill-defined area of extravasation and as such the supplying distal arcade was percutaneously coil embolized. Completion left colic and inferior mesenteric arteriograms are negative for residual areas of extravasation or collateral supply to the mid descending colon. Note, superior mesenteric arteriogram was not performed given exhaustive evaluation on dedicated mesenteric arteriogram performed 3 days prior (01/09/2021). IMPRESSION: Technically successful percutaneous coil embolization of distal arcades arising from both the superior and  inferior divisions of the left colic artery supplying the ill-defined area of intraluminal contrast extravasation involving the mid descending colon compatible with the findings seen on preceding CTA. PLAN: - The patient is to remain flat for 4 hours with right leg straight (until at least 0730). - The patient will continue to experience several additional bloody bowel movements and may continue to require additional resuscitation (as the patient was bleeding both before and during the procedure), however ultimately I am hopeful she will stabilize in the coming days. Electronically Signed   By: Sandi Mariscal M.D.   On: 01/12/2021 07:24   IR Angiogram Selective Each Additional Vessel  Result Date: 01/12/2021 INDICATION: Recurrent lower GI bleeding. Positive CTA. Please perform mesenteric arteriogram with potential embolization. EXAM: 1. ULTRASOUND GUIDANCE FOR ARTERIAL ACCESS 2. SELECTIVE INFERIOR MESENTERIC ARTERIOGRAM 3. SUB SELECTIVE LEFT COLIC ARTERIOGRAM 4. SUB SELECTIVE DISTAL ARCADE DIVISIONS OF THE LEFT COLIC ARTERY AND PERCUTANEOUS COIL EMBOLIZATION COMPARISON:  CTA abdomen and pelvis - 01/11/2021 Mesenteric arteriogram - 01/09/2021 Nuclear medicine tagged red blood cell bleeding scan - 01/08/2021 CT abdomen and pelvis - 01/08/2021 MEDICATIONS: None ANESTHESIA/SEDATION: Moderate (conscious) sedation was employed during this procedure. A total of Versed 0.5 mg was administered intravenously. Moderate Sedation Time: 60 minutes. The patient's level of consciousness and vital signs were monitored continuously by radiology nursing throughout the procedure under my direct supervision. CONTRAST:  45 cc Omnipaque 300 FLUOROSCOPY TIME:  19 minutes (371 mGy) COMPLICATIONS: SIR Level A - No therapy, no consequence. Procedure complicated by development of a small hematoma at the right groin access site. PROCEDURE: Informed consent was obtained from the patient following explanation of the procedure, risks, benefits  and alternatives. All questions were addressed. A time out was performed prior to the initiation of the procedure. Maximal barrier sterile technique utilized including caps, mask, sterile gowns, sterile gloves, large sterile drape, hand hygiene, and Betadine prep. The right femoral head was marked fluoroscopically. Under sterile conditions and local anesthesia, the right common femoral artery access was performed with a micropuncture needle. Under direct ultrasound  guidance, the right common femoral was accessed with a micropuncture kit. An ultrasound image was saved for documentation purposes. This allowed for placement of a 5-French vascular sheath. A limited arteriogram was performed through the side arm of the sheath confirming appropriate access within the right common femoral artery. Over a Bentson wire, a Mickelson catheter was advanced the caudal aspect of the thoracic aorta where was reformed, back bled and flushed. The Mickelson catheter was then utilized to select the inferior mesenteric artery and a selective superior mesenteric arteriogram was performed. With the use of a fathom 14 microwire, a regular Renegade microcatheter was advanced to the level of the left colic artery and a selective left colic arteriogram was performed. The microcatheter was then utilized to select the inferior division of the left colic artery and a sub selective arteriogram was performed. The microcatheter was then advanced to select the distal arcade supplying the ill-defined area of extravasation involving the mid descending colon. Sub selective injection confirmed appropriate positioning and the vessel was percutaneously coil embolized with two overlapping 2 mm diameter interlock coils. The microcatheter was retracted to the level the left colic artery and a post embolization left colic arteriogram was performed. The microcatheter was then advanced to select the distal arcade felt to potentially be supplying additional  collateral supply to the ill-defined area of active extravasation involving the mid descending colon. Sub selective injection confirmed appropriate positioning and the vessel was percutaneously coil embolized with two overlapping 2 mm diameter interlock coils. The microcatheter was then retracted to the to the level pelvic artery and a selective left colic arteriogram was performed. Microcatheter was removed and a post embolization arteriogram was performed at the level of the IMA Images were reviewed and the procedure was terminated. All wires, catheters and sheaths were removed from the patient. Hemostasis was achieved at the right groin access site with manual compression. Procedure was complicated by development of a small hematoma at the right groin access site. The patient otherwise tolerated the procedure well without immediate post procedural complication. FINDINGS: Selective inferior mesenteric arteriogram demonstrates conventional branching pattern with ill-defined area of extravasation involving the mid descending colon compatible with findings on preceding CTA. Selective left colic arteriogram redemonstrates the ill-defined area of extravasation within the mid descending colon supplied predominantly via the inferior division of the left colic artery with potential collateral supply from the superior division. Sub selective arteriogram of the inferior division the left colic artery confirms supply to the ill-defined area of extravasation and as such the supplying distal arcade was percutaneously coil embolized. Sub selective arteriogram of the superior division of the left colic artery suggests additional collateral supply to the ill-defined area of extravasation and as such the supplying distal arcade was percutaneously coil embolized. Completion left colic and inferior mesenteric arteriograms are negative for residual areas of extravasation or collateral supply to the mid descending colon. Note,  superior mesenteric arteriogram was not performed given exhaustive evaluation on dedicated mesenteric arteriogram performed 3 days prior (01/09/2021). IMPRESSION: Technically successful percutaneous coil embolization of distal arcades arising from both the superior and inferior divisions of the left colic artery supplying the ill-defined area of intraluminal contrast extravasation involving the mid descending colon compatible with the findings seen on preceding CTA. PLAN: - The patient is to remain flat for 4 hours with right leg straight (until at least 0730). - The patient will continue to experience several additional bloody bowel movements and may continue to require additional resuscitation (as the patient was  bleeding both before and during the procedure), however ultimately I am hopeful she will stabilize in the coming days. Electronically Signed   By: Sandi Mariscal M.D.   On: 01/12/2021 07:24   IR Angiogram Selective Each Additional Vessel  Result Date: 01/12/2021 INDICATION: Recurrent lower GI bleeding. Positive CTA. Please perform mesenteric arteriogram with potential embolization. EXAM: 1. ULTRASOUND GUIDANCE FOR ARTERIAL ACCESS 2. SELECTIVE INFERIOR MESENTERIC ARTERIOGRAM 3. SUB SELECTIVE LEFT COLIC ARTERIOGRAM 4. SUB SELECTIVE DISTAL ARCADE DIVISIONS OF THE LEFT COLIC ARTERY AND PERCUTANEOUS COIL EMBOLIZATION COMPARISON:  CTA abdomen and pelvis - 01/11/2021 Mesenteric arteriogram - 01/09/2021 Nuclear medicine tagged red blood cell bleeding scan - 01/08/2021 CT abdomen and pelvis - 01/08/2021 MEDICATIONS: None ANESTHESIA/SEDATION: Moderate (conscious) sedation was employed during this procedure. A total of Versed 0.5 mg was administered intravenously. Moderate Sedation Time: 60 minutes. The patient's level of consciousness and vital signs were monitored continuously by radiology nursing throughout the procedure under my direct supervision. CONTRAST:  45 cc Omnipaque 300 FLUOROSCOPY TIME:  19 minutes  (053 mGy) COMPLICATIONS: SIR Level A - No therapy, no consequence. Procedure complicated by development of a small hematoma at the right groin access site. PROCEDURE: Informed consent was obtained from the patient following explanation of the procedure, risks, benefits and alternatives. All questions were addressed. A time out was performed prior to the initiation of the procedure. Maximal barrier sterile technique utilized including caps, mask, sterile gowns, sterile gloves, large sterile drape, hand hygiene, and Betadine prep. The right femoral head was marked fluoroscopically. Under sterile conditions and local anesthesia, the right common femoral artery access was performed with a micropuncture needle. Under direct ultrasound guidance, the right common femoral was accessed with a micropuncture kit. An ultrasound image was saved for documentation purposes. This allowed for placement of a 5-French vascular sheath. A limited arteriogram was performed through the side arm of the sheath confirming appropriate access within the right common femoral artery. Over a Bentson wire, a Mickelson catheter was advanced the caudal aspect of the thoracic aorta where was reformed, back bled and flushed. The Mickelson catheter was then utilized to select the inferior mesenteric artery and a selective superior mesenteric arteriogram was performed. With the use of a fathom 14 microwire, a regular Renegade microcatheter was advanced to the level of the left colic artery and a selective left colic arteriogram was performed. The microcatheter was then utilized to select the inferior division of the left colic artery and a sub selective arteriogram was performed. The microcatheter was then advanced to select the distal arcade supplying the ill-defined area of extravasation involving the mid descending colon. Sub selective injection confirmed appropriate positioning and the vessel was percutaneously coil embolized with two overlapping 2  mm diameter interlock coils. The microcatheter was retracted to the level the left colic artery and a post embolization left colic arteriogram was performed. The microcatheter was then advanced to select the distal arcade felt to potentially be supplying additional collateral supply to the ill-defined area of active extravasation involving the mid descending colon. Sub selective injection confirmed appropriate positioning and the vessel was percutaneously coil embolized with two overlapping 2 mm diameter interlock coils. The microcatheter was then retracted to the to the level pelvic artery and a selective left colic arteriogram was performed. Microcatheter was removed and a post embolization arteriogram was performed at the level of the IMA Images were reviewed and the procedure was terminated. All wires, catheters and sheaths were removed from the patient. Hemostasis was achieved at  the right groin access site with manual compression. Procedure was complicated by development of a small hematoma at the right groin access site. The patient otherwise tolerated the procedure well without immediate post procedural complication. FINDINGS: Selective inferior mesenteric arteriogram demonstrates conventional branching pattern with ill-defined area of extravasation involving the mid descending colon compatible with findings on preceding CTA. Selective left colic arteriogram redemonstrates the ill-defined area of extravasation within the mid descending colon supplied predominantly via the inferior division of the left colic artery with potential collateral supply from the superior division. Sub selective arteriogram of the inferior division the left colic artery confirms supply to the ill-defined area of extravasation and as such the supplying distal arcade was percutaneously coil embolized. Sub selective arteriogram of the superior division of the left colic artery suggests additional collateral supply to the ill-defined  area of extravasation and as such the supplying distal arcade was percutaneously coil embolized. Completion left colic and inferior mesenteric arteriograms are negative for residual areas of extravasation or collateral supply to the mid descending colon. Note, superior mesenteric arteriogram was not performed given exhaustive evaluation on dedicated mesenteric arteriogram performed 3 days prior (01/09/2021). IMPRESSION: Technically successful percutaneous coil embolization of distal arcades arising from both the superior and inferior divisions of the left colic artery supplying the ill-defined area of intraluminal contrast extravasation involving the mid descending colon compatible with the findings seen on preceding CTA. PLAN: - The patient is to remain flat for 4 hours with right leg straight (until at least 0730). - The patient will continue to experience several additional bloody bowel movements and may continue to require additional resuscitation (as the patient was bleeding both before and during the procedure), however ultimately I am hopeful she will stabilize in the coming days. Electronically Signed   By: Sandi Mariscal M.D.   On: 01/12/2021 07:24   IR US Guide Vasc Access Right  Result Date: 01/12/2021 INDICATION: Recurrent lower GI bleeding. Positive CTA. Please perform mesenteric arteriogram with potential embolization. EXAM: 1. ULTRASOUND GUIDANCE FOR ARTERIAL ACCESS 2. SELECTIVE INFERIOR MESENTERIC ARTERIOGRAM 3. SUB SELECTIVE LEFT COLIC ARTERIOGRAM 4. SUB SELECTIVE DISTAL ARCADE DIVISIONS OF THE LEFT COLIC ARTERY AND PERCUTANEOUS COIL EMBOLIZATION COMPARISON:  CTA abdomen and pelvis - 01/11/2021 Mesenteric arteriogram - 01/09/2021 Nuclear medicine tagged red blood cell bleeding scan - 01/08/2021 CT abdomen and pelvis - 01/08/2021 MEDICATIONS: None ANESTHESIA/SEDATION: Moderate (conscious) sedation was employed during this procedure. A total of Versed 0.5 mg was administered intravenously. Moderate  Sedation Time: 60 minutes. The patient's level of consciousness and vital signs were monitored continuously by radiology nursing throughout the procedure under my direct supervision. CONTRAST:  45 cc Omnipaque 300 FLUOROSCOPY TIME:  19 minutes (491 mGy) COMPLICATIONS: SIR Level A - No therapy, no consequence. Procedure complicated by development of a small hematoma at the right groin access site. PROCEDURE: Informed consent was obtained from the patient following explanation of the procedure, risks, benefits and alternatives. All questions were addressed. A time out was performed prior to the initiation of the procedure. Maximal barrier sterile technique utilized including caps, mask, sterile gowns, sterile gloves, large sterile drape, hand hygiene, and Betadine prep. The right femoral head was marked fluoroscopically. Under sterile conditions and local anesthesia, the right common femoral artery access was performed with a micropuncture needle. Under direct ultrasound guidance, the right common femoral was accessed with a micropuncture kit. An ultrasound image was saved for documentation purposes. This allowed for placement of a 5-French vascular sheath. A limited arteriogram was  performed through the side arm of the sheath confirming appropriate access within the right common femoral artery. Over a Bentson wire, a Mickelson catheter was advanced the caudal aspect of the thoracic aorta where was reformed, back bled and flushed. The Mickelson catheter was then utilized to select the inferior mesenteric artery and a selective superior mesenteric arteriogram was performed. With the use of a fathom 14 microwire, a regular Renegade microcatheter was advanced to the level of the left colic artery and a selective left colic arteriogram was performed. The microcatheter was then utilized to select the inferior division of the left colic artery and a sub selective arteriogram was performed. The microcatheter was then  advanced to select the distal arcade supplying the ill-defined area of extravasation involving the mid descending colon. Sub selective injection confirmed appropriate positioning and the vessel was percutaneously coil embolized with two overlapping 2 mm diameter interlock coils. The microcatheter was retracted to the level the left colic artery and a post embolization left colic arteriogram was performed. The microcatheter was then advanced to select the distal arcade felt to potentially be supplying additional collateral supply to the ill-defined area of active extravasation involving the mid descending colon. Sub selective injection confirmed appropriate positioning and the vessel was percutaneously coil embolized with two overlapping 2 mm diameter interlock coils. The microcatheter was then retracted to the to the level pelvic artery and a selective left colic arteriogram was performed. Microcatheter was removed and a post embolization arteriogram was performed at the level of the IMA Images were reviewed and the procedure was terminated. All wires, catheters and sheaths were removed from the patient. Hemostasis was achieved at the right groin access site with manual compression. Procedure was complicated by development of a small hematoma at the right groin access site. The patient otherwise tolerated the procedure well without immediate post procedural complication. FINDINGS: Selective inferior mesenteric arteriogram demonstrates conventional branching pattern with ill-defined area of extravasation involving the mid descending colon compatible with findings on preceding CTA. Selective left colic arteriogram redemonstrates the ill-defined area of extravasation within the mid descending colon supplied predominantly via the inferior division of the left colic artery with potential collateral supply from the superior division. Sub selective arteriogram of the inferior division the left colic artery confirms supply  to the ill-defined area of extravasation and as such the supplying distal arcade was percutaneously coil embolized. Sub selective arteriogram of the superior division of the left colic artery suggests additional collateral supply to the ill-defined area of extravasation and as such the supplying distal arcade was percutaneously coil embolized. Completion left colic and inferior mesenteric arteriograms are negative for residual areas of extravasation or collateral supply to the mid descending colon. Note, superior mesenteric arteriogram was not performed given exhaustive evaluation on dedicated mesenteric arteriogram performed 3 days prior (01/09/2021). IMPRESSION: Technically successful percutaneous coil embolization of distal arcades arising from both the superior and inferior divisions of the left colic artery supplying the ill-defined area of intraluminal contrast extravasation involving the mid descending colon compatible with the findings seen on preceding CTA. PLAN: - The patient is to remain flat for 4 hours with right leg straight (until at least 0730). - The patient will continue to experience several additional bloody bowel movements and may continue to require additional resuscitation (as the patient was bleeding both before and during the procedure), however ultimately I am hopeful she will stabilize in the coming days. Electronically Signed   By: Sandi Mariscal M.D.   On: 01/12/2021  07:24   IR EMBO ARTERIAL NOT HEMORR HEMANG INC GUIDE ROADMAPPING  Result Date: 01/12/2021 INDICATION: Recurrent lower GI bleeding. Positive CTA. Please perform mesenteric arteriogram with potential embolization. EXAM: 1. ULTRASOUND GUIDANCE FOR ARTERIAL ACCESS 2. SELECTIVE INFERIOR MESENTERIC ARTERIOGRAM 3. SUB SELECTIVE LEFT COLIC ARTERIOGRAM 4. SUB SELECTIVE DISTAL ARCADE DIVISIONS OF THE LEFT COLIC ARTERY AND PERCUTANEOUS COIL EMBOLIZATION COMPARISON:  CTA abdomen and pelvis - 01/11/2021 Mesenteric arteriogram -  01/09/2021 Nuclear medicine tagged red blood cell bleeding scan - 01/08/2021 CT abdomen and pelvis - 01/08/2021 MEDICATIONS: None ANESTHESIA/SEDATION: Moderate (conscious) sedation was employed during this procedure. A total of Versed 0.5 mg was administered intravenously. Moderate Sedation Time: 60 minutes. The patient's level of consciousness and vital signs were monitored continuously by radiology nursing throughout the procedure under my direct supervision. CONTRAST:  45 cc Omnipaque 300 FLUOROSCOPY TIME:  19 minutes (734 mGy) COMPLICATIONS: SIR Level A - No therapy, no consequence. Procedure complicated by development of a small hematoma at the right groin access site. PROCEDURE: Informed consent was obtained from the patient following explanation of the procedure, risks, benefits and alternatives. All questions were addressed. A time out was performed prior to the initiation of the procedure. Maximal barrier sterile technique utilized including caps, mask, sterile gowns, sterile gloves, large sterile drape, hand hygiene, and Betadine prep. The right femoral head was marked fluoroscopically. Under sterile conditions and local anesthesia, the right common femoral artery access was performed with a micropuncture needle. Under direct ultrasound guidance, the right common femoral was accessed with a micropuncture kit. An ultrasound image was saved for documentation purposes. This allowed for placement of a 5-French vascular sheath. A limited arteriogram was performed through the side arm of the sheath confirming appropriate access within the right common femoral artery. Over a Bentson wire, a Mickelson catheter was advanced the caudal aspect of the thoracic aorta where was reformed, back bled and flushed. The Mickelson catheter was then utilized to select the inferior mesenteric artery and a selective superior mesenteric arteriogram was performed. With the use of a fathom 14 microwire, a regular Renegade  microcatheter was advanced to the level of the left colic artery and a selective left colic arteriogram was performed. The microcatheter was then utilized to select the inferior division of the left colic artery and a sub selective arteriogram was performed. The microcatheter was then advanced to select the distal arcade supplying the ill-defined area of extravasation involving the mid descending colon. Sub selective injection confirmed appropriate positioning and the vessel was percutaneously coil embolized with two overlapping 2 mm diameter interlock coils. The microcatheter was retracted to the level the left colic artery and a post embolization left colic arteriogram was performed. The microcatheter was then advanced to select the distal arcade felt to potentially be supplying additional collateral supply to the ill-defined area of active extravasation involving the mid descending colon. Sub selective injection confirmed appropriate positioning and the vessel was percutaneously coil embolized with two overlapping 2 mm diameter interlock coils. The microcatheter was then retracted to the to the level pelvic artery and a selective left colic arteriogram was performed. Microcatheter was removed and a post embolization arteriogram was performed at the level of the IMA Images were reviewed and the procedure was terminated. All wires, catheters and sheaths were removed from the patient. Hemostasis was achieved at the right groin access site with manual compression. Procedure was complicated by development of a small hematoma at the right groin access site. The patient otherwise tolerated the procedure  well without immediate post procedural complication. FINDINGS: Selective inferior mesenteric arteriogram demonstrates conventional branching pattern with ill-defined area of extravasation involving the mid descending colon compatible with findings on preceding CTA. Selective left colic arteriogram redemonstrates the  ill-defined area of extravasation within the mid descending colon supplied predominantly via the inferior division of the left colic artery with potential collateral supply from the superior division. Sub selective arteriogram of the inferior division the left colic artery confirms supply to the ill-defined area of extravasation and as such the supplying distal arcade was percutaneously coil embolized. Sub selective arteriogram of the superior division of the left colic artery suggests additional collateral supply to the ill-defined area of extravasation and as such the supplying distal arcade was percutaneously coil embolized. Completion left colic and inferior mesenteric arteriograms are negative for residual areas of extravasation or collateral supply to the mid descending colon. Note, superior mesenteric arteriogram was not performed given exhaustive evaluation on dedicated mesenteric arteriogram performed 3 days prior (01/09/2021). IMPRESSION: Technically successful percutaneous coil embolization of distal arcades arising from both the superior and inferior divisions of the left colic artery supplying the ill-defined area of intraluminal contrast extravasation involving the mid descending colon compatible with the findings seen on preceding CTA. PLAN: - The patient is to remain flat for 4 hours with right leg straight (until at least 0730). - The patient will continue to experience several additional bloody bowel movements and may continue to require additional resuscitation (as the patient was bleeding both before and during the procedure), however ultimately I am hopeful she will stabilize in the coming days. Electronically Signed   By: Sandi Mariscal M.D.   On: 01/12/2021 07:24   CT Angio Abd/Pel W and/or Wo Contrast  Result Date: 01/11/2021 CLINICAL DATA:  Gastrointestinal bleed.  bright red blood. EXAM: CTA ABDOMEN AND PELVIS WITHOUT AND WITH CONTRAST TECHNIQUE: Multidetector CT imaging of the abdomen and  pelvis was performed using the standard protocol during bolus administration of intravenous contrast. Multiplanar reconstructed images and MIPs were obtained and reviewed to evaluate the vascular anatomy. CONTRAST:  171m OMNIPAQUE IOHEXOL 350 MG/ML SOLN COMPARISON:  Nuclear medicine gastrointestinal bleeding scan 01/08/2021, CT abdomen pelvis 01/08/2021 FINDINGS: VASCULAR Aorta: Severe atherosclerotic plaque. Normal caliber aorta without aneurysm, dissection, vasculitis or significant stenosis. Celiac: Patent without evidence of aneurysm, dissection, vasculitis or significant stenosis. SMA: Patent without evidence of aneurysm, dissection, vasculitis or significant stenosis. Renals: Mild atherosclerotic plaque of the origins. Both renal arteries are patent without evidence of aneurysm, dissection, vasculitis, fibromuscular dysplasia or significant stenosis. IMA: Patent without evidence of aneurysm, dissection, vasculitis or significant stenosis. Inflow: Severe atherosclerotic plaque. Patent without evidence of aneurysm, dissection, vasculitis or significant stenosis. Proximal Outflow: At least moderate atherosclerotic plaque. Bilateral common femoral and visualized portions of the superficial and profunda femoral arteries are patent without evidence of aneurysm, dissection, vasculitis or significant stenosis. Veins: The hepatic, portal, splenic, superior mesenteric veins are patent. NON-VASCULAR Lower chest: Interval development of a 1.6 x 1.3 cm right lower lobe pulmonary nodular like consolidation likely represents atelectasis (not visualized on CT 2 days ago. Subsegmental atelectasis within bilateral lower lobes. Trace left pleural effusion. Hepatobiliary: No focal liver abnormality. No gallstones, gallbladder wall thickening, or pericholecystic fluid. No biliary dilatation. Pancreas: No focal lesion. Normal pancreatic contour. No surrounding inflammatory changes. No main pancreatic ductal dilatation. Spleen:  Normal in size without focal abnormality. Adrenals/Urinary Tract: No adrenal nodule bilaterally. Bilateral kidneys enhance symmetrically. Subcentimeter hypodensities within the kidneys are too small to characterize.  No hydronephrosis. No hydroureter. The urinary bladder is unremarkable. Stomach/Bowel: Left distal descending colon/sigmoid colon reanastomosis. Layering hyperdensity within the mid to distal descending colon is noted to slightly increase in density on the portal venous phase but is also not visualized on noncontrast study. Associated slightly hyperdense material within the colonic lumen distally. The colon is noted to be fluid-filled past the splenic flexure. No bowel wall thickening or dilatation. The appendix not definitely identified. Lymphatic: No lymphadenopathy. Reproductive: Status post hysterectomy. No adnexal masses. Other: No intraperitoneal free fluid. No intraperitoneal free gas. No organized fluid collection. Musculoskeletal: No abdominal wall hernia or abnormality. No suspicious lytic or blastic osseous lesions. No acute displaced fracture. Multilevel degenerative changes of the spine. 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 IMPRESSION: VASCULAR Aortic Atherosclerosis (ICD10-I70.0). NON-VASCULAR Mid to distal descending colon gastrointestinal hemorrhage that is located proximal to the colonic anastomosis. Electronically Signed   By: Iven Finn M.D.   On: 01/11/2021 22:36    Assessment / Plan:  1. Recurrent diverticular bleed. Readmitted to the hospital 2/27 with GI bleeding and anemia. Admission Hg 7.7. Transfused 1 unit of PRBCs. Abd/pelvic CTA 2/27 identified active bleeding to the distal descending colon/proximal to the colonic anastomosis. S/P mesenteric arteriogram with successful coil embolization of the left colic artery 2/24.  She received IV Venofer x 1 2/28. On Ferrous Sulfate 362m po bid. Hg 7.3 -> 7.0. One unit  of PRBCs ordered. No overt GI bleeding today. -Repeat H/H post transfusion -Transfuse PRN -Clear liquid diet for now. Advance to full liquid -> soft diet after blood transfusion completed and if no further evidence of active GI bleeding -Further recommendations per Dr. DLoletha Carrow -Pain management per the hospitalist   2. Leukocytosis, most likely from Entocort. WBC 18.2. She is afebrile.   3. Microscopic colitis on Entocort. No diarrhea.       Principal Problem:   Rectal bleed Active Problems:   Chronic lower GI bleeding   AVM (arteriovenous malformation) of colon   HTN (hypertension)   Acute blood loss anemia   GI bleed     LOS: 2 days   CNoralyn Pick 01/13/2021, 11:30 AM   I have discussed the case with the PA, and that is the plan I formulated. I personally interviewed and examined the patient.  Mild decrease in hemoglobin overnight without passage of bright red blood per rectum or clots.  She will probably continue to have black stool because she is on oral iron.  She still has some dull bandlike lower abdominal discomfort since the embolization, and that is expected to resolve on its own.  Abdominal exam benign, she would like to eat some regular food so orders were placed.  If she tolerates blood transfusion well and has no overt lower GI bleeding tomorrow, she can be discharged home to see [place].  She would need a repeat CBC with primary care in about a week. Signing off -call as needed.   HNelida MeuseIII Office: [phone]

## 2021-01-13 NOTE — Progress Notes (Signed)
Patient had a huge bloody bowl movement. Dark stool mixed with large bright red blood. Once patient got back into the bed she would not stop bleeding. On call provider notified, will continue to monitor.Thanks.

## 2021-01-13 NOTE — Progress Notes (Signed)
PROGRESS NOTE    Kathleen Pope  OMV:[id number] DOB: Sep 26, 1933 DOA: 01/11/2021 PCP: Irene Shipper, MD   Brief Narrative:  HPI per Dr. Gala Romney on 01/11/21 Kathleen Pope is a 87 y.o. female with medical history significant of recurrent rectal bleed due to AVMs and polyps, COPD, hypertension, hyperlipidemia, diverticulosis who was just discharged from the hospital yesterday secondary to admission for GI bleed.  During that hospitalization she had a bleeding scan done that was positive with mesenteric CT and she was embolized.  This was done on February 24.  She also had colonoscopy on 226 that showed multiple small polyps.  Patient went home on 1 day later was having massive rectal bleed.  She is still noted to be bleeding in the ER.  GI consulted.  Patient initiated on IV Protonix and recommendation was to repeat repeated CT angiogram.  This is now positive for active bleeding at the same site from previous.  Patient being readmitted to the hospital for further evaluation and treatment..  ED Course: Temperature is 98.2 blood pressure 100/64 dropping to 80s momentarily pulse 130 respirate 20 oxygen sat 98% on room air.  White count 8.4 hemoglobin has dropped to 8.7 from 10.1 overnight platelets 224.  Sodium 143 potassium 3.6 chloride 106 CO2 27 BUN 16 creatinine 0.85 calcium 8.4.  Glucose is 200.  CT angiogram of the abdomen and pelvis showed active bleeding in the mesenteric site where patient recently had embolization.  Interventional radiology has been paged and patient will be admitted for further evaluation and treatment  **Interim History Patient had an embolization yesterday of the 2 distal arcades of the IMA supplying an ill defined area of the active extravasation within the mid descending colon correlating with the findings to the CTA.  She is appears to be improved now and continues on LR.  GI following and recommending clear liquid diet and transfusion for hemoglobin less than 7.  Will need  PT OT to further evaluate and treat.  Patient is doing better and will transferred her to medical floor. This morning her hemoglobin dropped to seven so type and screen and transfuse 1 unit PRBCs. She is asking to go home and ask about her diet to be advanced. We will defer to GI to advance her diet given her low hemoglobin. PT OT recommending no follow-up. Likely can be discharged in next 24 to 48 hours if blood count remains stable and cleared from a GI perspective  Assessment & Plan:   Principal Problem:   Rectal bleed Active Problems:   Chronic lower GI bleeding   AVM (arteriovenous malformation) of colon   HTN (hypertension)   Acute blood loss anemia   GI bleed  Recurrent GI Bleeding  With Rectal Bleeding, upper appears to be stable but blood count dropped overnight -Recently was admitted and discharged on 01/10/21 -Hgb/Hct went from 10.9/32.2 -> 8.7/25.7 -> 7.7/22.9 and was typed and screend and transfused 1 unit of pRBC's and will be given another 1 unit today given that her hemoglobin/hematocrit is now 7.0/20.2 -Patient has active bleeding based on repeat CT angiogram. CTA showed "Mid to distal descending colon gastrointestinal hemorrhage that is located proximal to the colonic anastomosis." -She was started on Pantoprazole IV 40 mg q12h and will continue  -Given Budesonide 6 mg po Daily  -Was NPO for Emobolization and now on Clear Liquid Diet and deferring to GI to advance her diet -Patient is s/p "Post mesenteric arteriogram and percutaneous embolization of 2 distal  arcades of the IMA supplying an ill defined area of active extravasation within the mid descending colon correlating with the findings on preceding CTA." -We were continuing LR at 50 mL/hr but will stop now -C/w Ferrous Sulfate 325 mg po BID -C/w Supportive Care and appreciate GI and IR assistance and management  -GI hoping that the embolization yesterday was successful and the hemoglobin is stabilized with minimal  further bloody bowel movements.  They are recommending observing the patient for the least the next 24 to 48 hours -She is medically improved and does not need to be in the intensive care unit so we will transfer to the telemetry floor. -GI recommending continue to monitor hemoglobin with transfusion of less than 7 -Will get PT/OT to evaluate and Treat  Acute Blood Loss Anemia -Patient was awaiting transfusion of packed red blood cells and was only transfused 1 unit PRBCs -Hgb/Hct went from 10.9/32.2 -> 8.7/25.7 -> 7.7/22.9 and she is typed and screened and transfuse 1 unit PRBCs and we will transfuse another 1 unit today given that her hemoglobin is 7.0/20.2 -Transfusion threshold less than 7 -Continue to monitor for signs and symptoms of bleeding; currently no overt bleeding noted -Repeat CBC as above and in the a.m.  Mild AOS -Per echo 201 -Has appointment for outpatient echo in the next 2 to 3 weeks -Had her Lasix held on discharge on 01/10/21 and continue to hold for now -Holding Losartan given Hypotension and soft blood pressure given that it is 101/71  Hypokalemia -Patient's K+ went from 3.3 is now 4.6 -Check Mag Level and was 1.8 -Replete with po KCl 40 mEQ BID x2 yesterday; -We will give IV mag sulfate 2 g -Continue to Monitor and Replete as Necessary -Repeat CMP in the AM   Hyperglycemia -Patient's Blood Sugar on Admission was 200 and repeat this AM on BMP was 121 -Check HbA1c in the AM this is pending -Continue to Monitor Blood Sugars Carefeully; If necessary will place on Sensitive Novolog SSI  COPD  -presumed-former smoker -Asymptomatic and currently not in Acute Exacerbation -If necessary will add Nebs   HTN but was Hypotensive due to GIB as above -Holding meds of Losartan 50 mg po daily and Furosemide  -Continue to Monitor BP per Protocol -Last BP is now 101/71  Leukocytosis -Worsening  -Patient's WBC went from 8.8 and trending up and went to 14.8 -> 16.0 ->  18.2 -Continues to have some abdominal pain after her embolization and may need abdominal imaging but will defer to GI -Continue to monitor and trend leukocytosis and likely is reactive but will need to rule out infection  HLD -C/w Pravastatin 10 mg po Daily   GOC: DNR, poA  DVT prophylaxis: SCDs given her Bleeding  Code Status: DO NOT RESUSCITATE  Family Communication: No family present at bedside  Disposition Plan: Transfer to Telemetry and will need evaluation by PT/OT and Clearance by GI for Safe Discharge Disposition; will need to make sure she is not bleeding and if further hemoglobin is stable prior to safe discharge  Status is: Inpatient  Remains inpatient appropriate because:Unsafe d/c plan, IV treatments appropriate due to intensity of illness or inability to take PO and Inpatient level of care appropriate due to severity of illness   Dispo: The patient is from: Home              Anticipated d/c is to: TBD              Patient currently is  not medically stable to d/c.   Difficult to place patient No  Consultants:   Gastroenterology  Interventional Radiology    Procedures:  Post mesenteric arteriogram and percutaneous embolization of 2 distal arcades of the IMA supplying an ill defined area of active extravasation within the mid descending colon correlating with the findings on preceding CTA.   Antimicrobials: Anti-infectives (From admission, onward)   None        Subjective: Seen and examined at bedside sitting in the chair bedside talking on the phone and complaining of some mild abdominal pain still. States that her abdomen is sore ever since her procedure but is tolerable. No nausea or vomiting. Denies any lightheadedness or dizziness and asking when she can go home. No other concerns or complaints at this time.  Objective: Vitals:   01/12/21 1835 01/12/21 2105 01/13/21 0135 01/13/21 0401  BP: 131/82 (!) 114/59 108/61 101/71  Pulse: 97 98 91 99  Resp:  _0 Temp: 98.1 F (36.7 C) 99.9 F (37.7 C) 98.8 F (37.1 C) 100.3 F (37.9 C)  TempSrc: Oral Oral Oral   SpO2: 98% 98% 98% 93%  Weight:      Height:        Intake/Output Summary (Last 24 hours) at 01/13/2021 1048 Last data filed at 01/13/2021 0903 Gross per 24 hour  Intake 1831.85 ml  Output 200 ml  Net 1631.85 ml   Filed Weights   01/11/21 1908  Weight: 39.9 kg   Examination: Physical Exam:  Constitutional: The patient is a thin elderly Caucasian female currently in no acute distress appears calm and comfortable sitting in the chair bedside Eyes: Has some conjunctival hemorrhage on the right eye. Lids normal ENMT: External Ears, Nose appear normal. Grossly normal hearing. Neck: Appears normal, supple, no cervical masses, normal ROM, no appreciable thyromegaly; no JVD Respiratory: Diminished to auscultation bilaterally, no wheezing, rales, rhonchi or crackles. Normal respiratory effort and patient is not tachypenic. No accessory muscle use. Unlabored breathing Cardiovascular: RRR, no murmurs / rubs / gallops. S1 and S2 auscultated. No extremity edema. Abdomen: Soft, tender to palpate, non-distended. Bowel sounds positive.  GU: Deferred. Musculoskeletal: No clubbing / cyanosis of digits/nails. No joint deformity upper and lower extremities.  Skin: No rashes, lesions, ulcers on limited skin evaluation. No induration; Warm and dry.  Neurologic: CN 2-12 grossly intact with no focal deficits.  Romberg sign and cerebellar reflexes not assessed.  Psychiatric: Normal judgment and insight. Alert and oriented x 3. Mildly anxious mood and appropriate affect.   Data Reviewed: I have personally reviewed following labs and imaging studies  CBC: Recent Labs  Lab 01/11/21 2015 01/12/21 0700 01/12/21 1347 01/12/21 1932 01/13/21 0347  WBC 8.4 8.8 14.8* 16.0* 18.2*  NEUTROABS 6.2  --   --   --  14.7*  HGB 8.7* 7.7* 7.9* 7.3* 7.0*  HCT 25.7* 22.9* 22.9* 21.6* 20.2*  MCV 97.3 92.7  95.4 95.2 95.7  PLT 225 180 203 191 950   Basic Metabolic Panel: Recent Labs  Lab 01/09/21 0815 01/10/21 0305 01/11/21 2015 01/12/21 0700 01/13/21 0347  NA 140 142 143 142 140  K 5.1 3.6 3.6 3.3* 4.6  CL 105 111 106 110 107  CO2 _1 GLUCOSE 119* 117* 200* 107* 121*  BUN _2 CREATININE 0.81 0.72 0.85 0.59 0.74  CALCIUM 8.3* 8.2* 8.4* 7.3* 8.0*  MG  --   --   --   --  1.8  PHOS  --   --   --   --  2.6   GFR: Estimated Creatinine Clearance: 32.4 mL/min (by C-G formula based on SCr of 0.74 mg/dL). Liver Function Tests: Recent Labs  Lab 01/08/21 1140 01/11/21 2015 01/13/21 0347  AST _0 ALT _1 ALKPHOS 57 43 39  BILITOT 0.7 0.4 0.8  PROT 7.2 5.4* 4.7*  ALBUMIN 4.0 3.0* 2.6*   No results for input(s): LIPASE, AMYLASE in the last 168 hours. No results for input(s): AMMONIA in the last 168 hours. Coagulation Profile: Recent Labs  Lab 01/08/21 1140  INR 0.9   Cardiac Enzymes: No results for input(s): CKTOTAL, CKMB, CKMBINDEX, TROPONINI in the last 168 hours. BNP (last 3 results) No results for input(s): PROBNP in the last 8760 hours. HbA1C: No results for input(s): HGBA1C in the last 72 hours. CBG: No results for input(s): GLUCAP in the last 168 hours. Lipid Profile: No results for input(s): CHOL, HDL, LDLCALC, TRIG, CHOLHDL, LDLDIRECT in the last 72 hours. Thyroid Function Tests: No results for input(s): TSH, T4TOTAL, FREET4, T3FREE, THYROIDAB in the last 72 hours. Anemia Panel: No results for input(s): VITAMINB12, FOLATE, FERRITIN, TIBC, IRON, RETICCTPCT in the last 72 hours. Sepsis Labs: No results for input(s): PROCALCITON, LATICACIDVEN in the last 168 hours.  Recent Results (from the past 240 hour(s))  Resp Panel by RT-PCR (Flu A&B, Covid) Nasopharyngeal Swab     Status: None   Collection Time: 01/08/21  2:51 PM   Specimen: Nasopharyngeal Swab; Nasopharyngeal(NP) swabs in vial transport medium  Result Value Ref Range  Status   SARS Coronavirus 2 by RT PCR NEGATIVE NEGATIVE Final    Comment: (NOTE) SARS-CoV-2 target nucleic acids are NOT DETECTED.  The SARS-CoV-2 RNA is generally detectable in upper respiratory specimens during the acute phase of infection. The lowest concentration of SARS-CoV-2 viral copies this assay can detect is 138 copies/mL. A negative result does not preclude SARS-Cov-2 infection and should not be used as the sole basis for treatment or other patient management decisions. A negative result may occur with  improper specimen collection/handling, submission of specimen other than nasopharyngeal swab, presence of viral mutation(s) within the areas targeted by this assay, and inadequate number of viral copies(<138 copies/mL). A negative result must be combined with clinical observations, patient history, and epidemiological information. The expected result is Negative.  Fact Sheet for Patients:  EntrepreneurPulse.com.au  Fact Sheet for Healthcare Providers:  IncredibleEmployment.be  This test is no t yet approved or cleared by the [place] FDA and  has been authorized for detection and/or diagnosis of SARS-CoV-2 by FDA under an Emergency Use Authorization (EUA). This EUA will remain  in effect (meaning this test can be used) for the duration of the COVID-19 declaration under Section 564(b)(1) of the Act, 21 U.S.C.section 360bbb-3(b)(1), unless the authorization is terminated  or revoked sooner.       Influenza A by PCR NEGATIVE NEGATIVE Final   Influenza B by PCR NEGATIVE NEGATIVE Final    Comment: (NOTE) The Xpert Xpress SARS-CoV-2/FLU/RSV plus assay is intended as an aid in the diagnosis of influenza from Nasopharyngeal swab specimens and should not be used as a sole basis for treatment. Nasal washings and aspirates are unacceptable for Xpert Xpress SARS-CoV-2/FLU/RSV testing.  Fact Sheet for  Patients: EntrepreneurPulse.com.au  Fact Sheet for Healthcare Providers: IncredibleEmployment.be  This test is not yet approved or cleared by the [place] FDA and has been authorized for detection and/or  diagnosis of SARS-CoV-2 by FDA under an Emergency Use Authorization (EUA). This EUA will remain in effect (meaning this test can be used) for the duration of the COVID-19 declaration under Section 564(b)(1) of the Act, 21 U.S.C. section 360bbb-3(b)(1), unless the authorization is terminated or revoked.  Performed at [facility], [place] [address] [place], [place] [address]   Resp Panel by RT-PCR (Flu A&B, Covid) Nasopharyngeal Swab     Status: None   Collection Time: 01/11/21  7:53 PM   Specimen: Nasopharyngeal Swab; Nasopharyngeal(NP) swabs in vial transport medium  Result Value Ref Range Status   SARS Coronavirus 2 by RT PCR NEGATIVE NEGATIVE Final    Comment: (NOTE) SARS-CoV-2 target nucleic acids are NOT DETECTED.  The SARS-CoV-2 RNA is generally detectable in upper respiratory specimens during the acute phase of infection. The lowest concentration of SARS-CoV-2 viral copies this assay can detect is 138 copies/mL. A negative result does not preclude SARS-Cov-2 infection and should not be used as the sole basis for treatment or other patient management decisions. A negative result may occur with  improper specimen collection/handling, submission of specimen other than nasopharyngeal swab, presence of viral mutation(s) within the areas targeted by this assay, and inadequate number of viral copies(<138 copies/mL). A negative result must be combined with clinical observations, patient history, and epidemiological information. The expected result is Negative.  Fact Sheet for Patients:  EntrepreneurPulse.com.au  Fact Sheet for Healthcare Providers:  IncredibleEmployment.be  This test  is no t yet approved or cleared by the [place] FDA and  has been authorized for detection and/or diagnosis of SARS-CoV-2 by FDA under an Emergency Use Authorization (EUA). This EUA will remain  in effect (meaning this test can be used) for the duration of the COVID-19 declaration under Section 564(b)(1) of the Act, 21 U.S.C.section 360bbb-3(b)(1), unless the authorization is terminated  or revoked sooner.       Influenza A by PCR NEGATIVE NEGATIVE Final   Influenza B by PCR NEGATIVE NEGATIVE Final    Comment: (NOTE) The Xpert Xpress SARS-CoV-2/FLU/RSV plus assay is intended as an aid in the diagnosis of influenza from Nasopharyngeal swab specimens and should not be used as a sole basis for treatment. Nasal washings and aspirates are unacceptable for Xpert Xpress SARS-CoV-2/FLU/RSV testing.  Fact Sheet for Patients: EntrepreneurPulse.com.au  Fact Sheet for Healthcare Providers: IncredibleEmployment.be  This test is not yet approved or cleared by the [place] FDA and has been authorized for detection and/or diagnosis of SARS-CoV-2 by FDA under an Emergency Use Authorization (EUA). This EUA will remain in effect (meaning this test can be used) for the duration of the COVID-19 declaration under Section 564(b)(1) of the Act, 21 U.S.C. section 360bbb-3(b)(1), unless the authorization is terminated or revoked.  Performed at [facility], [place] [address], [place], [place] [address]   MRSA PCR Screening     Status: None   Collection Time: 01/12/21  1:47 AM   Specimen: Nasopharyngeal  Result Value Ref Range Status   MRSA by PCR NEGATIVE NEGATIVE Final    Comment:        The GeneXpert MRSA Assay (FDA approved for NASAL specimens only), is one component of a comprehensive MRSA colonization surveillance program. It is not intended to diagnose MRSA infection nor to guide or monitor treatment for MRSA  infections. Performed at [facility], [place] [address] [place],  [address]      RN Pressure Injury Documentation:     Estimated  body mass index is 16.36 kg/m as calculated from the following:   Height as of this encounter: 5' 1.5" (1.562 m).   Weight as of this encounter: 39.9 kg.  Malnutrition Type:   Malnutrition Characteristics:   Nutrition Interventions:    Radiology Studies: IR Angiogram Visceral Selective  Result Date: 01/12/2021 INDICATION: Recurrent lower GI bleeding. Positive CTA. Please perform mesenteric arteriogram with potential embolization. EXAM: 1. ULTRASOUND GUIDANCE FOR ARTERIAL ACCESS 2. SELECTIVE INFERIOR MESENTERIC ARTERIOGRAM 3. SUB SELECTIVE LEFT COLIC ARTERIOGRAM 4. SUB SELECTIVE DISTAL ARCADE DIVISIONS OF THE LEFT COLIC ARTERY AND PERCUTANEOUS COIL EMBOLIZATION COMPARISON:  CTA abdomen and pelvis - 01/11/2021 Mesenteric arteriogram - 01/09/2021 Nuclear medicine tagged red blood cell bleeding scan - 01/08/2021 CT abdomen and pelvis - 01/08/2021 MEDICATIONS: None ANESTHESIA/SEDATION: Moderate (conscious) sedation was employed during this procedure. A total of Versed 0.5 mg was administered intravenously. Moderate Sedation Time: 60 minutes. The patient's level of consciousness and vital signs were monitored continuously by radiology nursing throughout the procedure under my direct supervision. CONTRAST:  45 cc Omnipaque 300 FLUOROSCOPY TIME:  19 minutes (161 mGy) COMPLICATIONS: SIR Level A - No therapy, no consequence. Procedure complicated by development of a small hematoma at the right groin access site. PROCEDURE: Informed consent was obtained from the patient following explanation of the procedure, risks, benefits and alternatives. All questions were addressed. A time out was performed prior to the initiation of the procedure. Maximal barrier sterile technique utilized including caps, mask, sterile gowns, sterile gloves, large sterile drape,  hand hygiene, and Betadine prep. The right femoral head was marked fluoroscopically. Under sterile conditions and local anesthesia, the right common femoral artery access was performed with a micropuncture needle. Under direct ultrasound guidance, the right common femoral was accessed with a micropuncture kit. An ultrasound image was saved for documentation purposes. This allowed for placement of a 5-French vascular sheath. A limited arteriogram was performed through the side arm of the sheath confirming appropriate access within the right common femoral artery. Over a Bentson wire, a Mickelson catheter was advanced the caudal aspect of the thoracic aorta where was reformed, back bled and flushed. The Mickelson catheter was then utilized to select the inferior mesenteric artery and a selective superior mesenteric arteriogram was performed. With the use of a fathom 14 microwire, a regular Renegade microcatheter was advanced to the level of the left colic artery and a selective left colic arteriogram was performed. The microcatheter was then utilized to select the inferior division of the left colic artery and a sub selective arteriogram was performed. The microcatheter was then advanced to select the distal arcade supplying the ill-defined area of extravasation involving the mid descending colon. Sub selective injection confirmed appropriate positioning and the vessel was percutaneously coil embolized with two overlapping 2 mm diameter interlock coils. The microcatheter was retracted to the level the left colic artery and a post embolization left colic arteriogram was performed. The microcatheter was then advanced to select the distal arcade felt to potentially be supplying additional collateral supply to the ill-defined area of active extravasation involving the mid descending colon. Sub selective injection confirmed appropriate positioning and the vessel was percutaneously coil embolized with two overlapping 2 mm  diameter interlock coils. The microcatheter was then retracted to the to the level pelvic artery and a selective left colic arteriogram was performed. Microcatheter was removed and a post embolization arteriogram was performed at the level of the IMA Images were reviewed and the procedure was terminated. All wires, catheters and  sheaths were removed from the patient. Hemostasis was achieved at the right groin access site with manual compression. Procedure was complicated by development of a small hematoma at the right groin access site. The patient otherwise tolerated the procedure well without immediate post procedural complication. FINDINGS: Selective inferior mesenteric arteriogram demonstrates conventional branching pattern with ill-defined area of extravasation involving the mid descending colon compatible with findings on preceding CTA. Selective left colic arteriogram redemonstrates the ill-defined area of extravasation within the mid descending colon supplied predominantly via the inferior division of the left colic artery with potential collateral supply from the superior division. Sub selective arteriogram of the inferior division the left colic artery confirms supply to the ill-defined area of extravasation and as such the supplying distal arcade was percutaneously coil embolized. Sub selective arteriogram of the superior division of the left colic artery suggests additional collateral supply to the ill-defined area of extravasation and as such the supplying distal arcade was percutaneously coil embolized. Completion left colic and inferior mesenteric arteriograms are negative for residual areas of extravasation or collateral supply to the mid descending colon. Note, superior mesenteric arteriogram was not performed given exhaustive evaluation on dedicated mesenteric arteriogram performed 3 days prior (01/09/2021). IMPRESSION: Technically successful percutaneous coil embolization of distal arcades arising  from both the superior and inferior divisions of the left colic artery supplying the ill-defined area of intraluminal contrast extravasation involving the mid descending colon compatible with the findings seen on preceding CTA. PLAN: - The patient is to remain flat for 4 hours with right leg straight (until at least 0730). - The patient will continue to experience several additional bloody bowel movements and may continue to require additional resuscitation (as the patient was bleeding both before and during the procedure), however ultimately I am hopeful she will stabilize in the coming days. Electronically Signed   By: Sandi Mariscal M.D.   On: 01/12/2021 07:24   IR Angiogram Selective Each Additional Vessel  Result Date: 01/12/2021 INDICATION: Recurrent lower GI bleeding. Positive CTA. Please perform mesenteric arteriogram with potential embolization. EXAM: 1. ULTRASOUND GUIDANCE FOR ARTERIAL ACCESS 2. SELECTIVE INFERIOR MESENTERIC ARTERIOGRAM 3. SUB SELECTIVE LEFT COLIC ARTERIOGRAM 4. SUB SELECTIVE DISTAL ARCADE DIVISIONS OF THE LEFT COLIC ARTERY AND PERCUTANEOUS COIL EMBOLIZATION COMPARISON:  CTA abdomen and pelvis - 01/11/2021 Mesenteric arteriogram - 01/09/2021 Nuclear medicine tagged red blood cell bleeding scan - 01/08/2021 CT abdomen and pelvis - 01/08/2021 MEDICATIONS: None ANESTHESIA/SEDATION: Moderate (conscious) sedation was employed during this procedure. A total of Versed 0.5 mg was administered intravenously. Moderate Sedation Time: 60 minutes. The patient's level of consciousness and vital signs were monitored continuously by radiology nursing throughout the procedure under my direct supervision. CONTRAST:  45 cc Omnipaque 300 FLUOROSCOPY TIME:  19 minutes (979 mGy) COMPLICATIONS: SIR Level A - No therapy, no consequence. Procedure complicated by development of a small hematoma at the right groin access site. PROCEDURE: Informed consent was obtained from the patient following explanation of the  procedure, risks, benefits and alternatives. All questions were addressed. A time out was performed prior to the initiation of the procedure. Maximal barrier sterile technique utilized including caps, mask, sterile gowns, sterile gloves, large sterile drape, hand hygiene, and Betadine prep. The right femoral head was marked fluoroscopically. Under sterile conditions and local anesthesia, the right common femoral artery access was performed with a micropuncture needle. Under direct ultrasound guidance, the right common femoral was accessed with a micropuncture kit. An ultrasound image was saved for documentation purposes. This allowed for  placement of a 5-French vascular sheath. A limited arteriogram was performed through the side arm of the sheath confirming appropriate access within the right common femoral artery. Over a Bentson wire, a Mickelson catheter was advanced the caudal aspect of the thoracic aorta where was reformed, back bled and flushed. The Mickelson catheter was then utilized to select the inferior mesenteric artery and a selective superior mesenteric arteriogram was performed. With the use of a fathom 14 microwire, a regular Renegade microcatheter was advanced to the level of the left colic artery and a selective left colic arteriogram was performed. The microcatheter was then utilized to select the inferior division of the left colic artery and a sub selective arteriogram was performed. The microcatheter was then advanced to select the distal arcade supplying the ill-defined area of extravasation involving the mid descending colon. Sub selective injection confirmed appropriate positioning and the vessel was percutaneously coil embolized with two overlapping 2 mm diameter interlock coils. The microcatheter was retracted to the level the left colic artery and a post embolization left colic arteriogram was performed. The microcatheter was then advanced to select the distal arcade felt to potentially  be supplying additional collateral supply to the ill-defined area of active extravasation involving the mid descending colon. Sub selective injection confirmed appropriate positioning and the vessel was percutaneously coil embolized with two overlapping 2 mm diameter interlock coils. The microcatheter was then retracted to the to the level pelvic artery and a selective left colic arteriogram was performed. Microcatheter was removed and a post embolization arteriogram was performed at the level of the IMA Images were reviewed and the procedure was terminated. All wires, catheters and sheaths were removed from the patient. Hemostasis was achieved at the right groin access site with manual compression. Procedure was complicated by development of a small hematoma at the right groin access site. The patient otherwise tolerated the procedure well without immediate post procedural complication. FINDINGS: Selective inferior mesenteric arteriogram demonstrates conventional branching pattern with ill-defined area of extravasation involving the mid descending colon compatible with findings on preceding CTA. Selective left colic arteriogram redemonstrates the ill-defined area of extravasation within the mid descending colon supplied predominantly via the inferior division of the left colic artery with potential collateral supply from the superior division. Sub selective arteriogram of the inferior division the left colic artery confirms supply to the ill-defined area of extravasation and as such the supplying distal arcade was percutaneously coil embolized. Sub selective arteriogram of the superior division of the left colic artery suggests additional collateral supply to the ill-defined area of extravasation and as such the supplying distal arcade was percutaneously coil embolized. Completion left colic and inferior mesenteric arteriograms are negative for residual areas of extravasation or collateral supply to the mid  descending colon. Note, superior mesenteric arteriogram was not performed given exhaustive evaluation on dedicated mesenteric arteriogram performed 3 days prior (01/09/2021). IMPRESSION: Technically successful percutaneous coil embolization of distal arcades arising from both the superior and inferior divisions of the left colic artery supplying the ill-defined area of intraluminal contrast extravasation involving the mid descending colon compatible with the findings seen on preceding CTA. PLAN: - The patient is to remain flat for 4 hours with right leg straight (until at least 0730). - The patient will continue to experience several additional bloody bowel movements and may continue to require additional resuscitation (as the patient was bleeding both before and during the procedure), however ultimately I am hopeful she will stabilize in the coming days. Electronically Signed  By: Sandi Mariscal M.D.   On: 01/12/2021 07:24   IR Angiogram Selective Each Additional Vessel  Result Date: 01/12/2021 INDICATION: Recurrent lower GI bleeding. Positive CTA. Please perform mesenteric arteriogram with potential embolization. EXAM: 1. ULTRASOUND GUIDANCE FOR ARTERIAL ACCESS 2. SELECTIVE INFERIOR MESENTERIC ARTERIOGRAM 3. SUB SELECTIVE LEFT COLIC ARTERIOGRAM 4. SUB SELECTIVE DISTAL ARCADE DIVISIONS OF THE LEFT COLIC ARTERY AND PERCUTANEOUS COIL EMBOLIZATION COMPARISON:  CTA abdomen and pelvis - 01/11/2021 Mesenteric arteriogram - 01/09/2021 Nuclear medicine tagged red blood cell bleeding scan - 01/08/2021 CT abdomen and pelvis - 01/08/2021 MEDICATIONS: None ANESTHESIA/SEDATION: Moderate (conscious) sedation was employed during this procedure. A total of Versed 0.5 mg was administered intravenously. Moderate Sedation Time: 60 minutes. The patient's level of consciousness and vital signs were monitored continuously by radiology nursing throughout the procedure under my direct supervision. CONTRAST:  45 cc Omnipaque 300  FLUOROSCOPY TIME:  19 minutes (371 mGy) COMPLICATIONS: SIR Level A - No therapy, no consequence. Procedure complicated by development of a small hematoma at the right groin access site. PROCEDURE: Informed consent was obtained from the patient following explanation of the procedure, risks, benefits and alternatives. All questions were addressed. A time out was performed prior to the initiation of the procedure. Maximal barrier sterile technique utilized including caps, mask, sterile gowns, sterile gloves, large sterile drape, hand hygiene, and Betadine prep. The right femoral head was marked fluoroscopically. Under sterile conditions and local anesthesia, the right common femoral artery access was performed with a micropuncture needle. Under direct ultrasound guidance, the right common femoral was accessed with a micropuncture kit. An ultrasound image was saved for documentation purposes. This allowed for placement of a 5-French vascular sheath. A limited arteriogram was performed through the side arm of the sheath confirming appropriate access within the right common femoral artery. Over a Bentson wire, a Mickelson catheter was advanced the caudal aspect of the thoracic aorta where was reformed, back bled and flushed. The Mickelson catheter was then utilized to select the inferior mesenteric artery and a selective superior mesenteric arteriogram was performed. With the use of a fathom 14 microwire, a regular Renegade microcatheter was advanced to the level of the left colic artery and a selective left colic arteriogram was performed. The microcatheter was then utilized to select the inferior division of the left colic artery and a sub selective arteriogram was performed. The microcatheter was then advanced to select the distal arcade supplying the ill-defined area of extravasation involving the mid descending colon. Sub selective injection confirmed appropriate positioning and the vessel was percutaneously coil  embolized with two overlapping 2 mm diameter interlock coils. The microcatheter was retracted to the level the left colic artery and a post embolization left colic arteriogram was performed. The microcatheter was then advanced to select the distal arcade felt to potentially be supplying additional collateral supply to the ill-defined area of active extravasation involving the mid descending colon. Sub selective injection confirmed appropriate positioning and the vessel was percutaneously coil embolized with two overlapping 2 mm diameter interlock coils. The microcatheter was then retracted to the to the level pelvic artery and a selective left colic arteriogram was performed. Microcatheter was removed and a post embolization arteriogram was performed at the level of the IMA Images were reviewed and the procedure was terminated. All wires, catheters and sheaths were removed from the patient. Hemostasis was achieved at the right groin access site with manual compression. Procedure was complicated by development of a small hematoma at the right groin access site.  The patient otherwise tolerated the procedure well without immediate post procedural complication. FINDINGS: Selective inferior mesenteric arteriogram demonstrates conventional branching pattern with ill-defined area of extravasation involving the mid descending colon compatible with findings on preceding CTA. Selective left colic arteriogram redemonstrates the ill-defined area of extravasation within the mid descending colon supplied predominantly via the inferior division of the left colic artery with potential collateral supply from the superior division. Sub selective arteriogram of the inferior division the left colic artery confirms supply to the ill-defined area of extravasation and as such the supplying distal arcade was percutaneously coil embolized. Sub selective arteriogram of the superior division of the left colic artery suggests additional  collateral supply to the ill-defined area of extravasation and as such the supplying distal arcade was percutaneously coil embolized. Completion left colic and inferior mesenteric arteriograms are negative for residual areas of extravasation or collateral supply to the mid descending colon. Note, superior mesenteric arteriogram was not performed given exhaustive evaluation on dedicated mesenteric arteriogram performed 3 days prior (01/09/2021). IMPRESSION: Technically successful percutaneous coil embolization of distal arcades arising from both the superior and inferior divisions of the left colic artery supplying the ill-defined area of intraluminal contrast extravasation involving the mid descending colon compatible with the findings seen on preceding CTA. PLAN: - The patient is to remain flat for 4 hours with right leg straight (until at least 0730). - The patient will continue to experience several additional bloody bowel movements and may continue to require additional resuscitation (as the patient was bleeding both before and during the procedure), however ultimately I am hopeful she will stabilize in the coming days. Electronically Signed   By: Sandi Mariscal M.D.   On: 01/12/2021 07:24   IR Angiogram Selective Each Additional Vessel  Result Date: 01/12/2021 INDICATION: Recurrent lower GI bleeding. Positive CTA. Please perform mesenteric arteriogram with potential embolization. EXAM: 1. ULTRASOUND GUIDANCE FOR ARTERIAL ACCESS 2. SELECTIVE INFERIOR MESENTERIC ARTERIOGRAM 3. SUB SELECTIVE LEFT COLIC ARTERIOGRAM 4. SUB SELECTIVE DISTAL ARCADE DIVISIONS OF THE LEFT COLIC ARTERY AND PERCUTANEOUS COIL EMBOLIZATION COMPARISON:  CTA abdomen and pelvis - 01/11/2021 Mesenteric arteriogram - 01/09/2021 Nuclear medicine tagged red blood cell bleeding scan - 01/08/2021 CT abdomen and pelvis - 01/08/2021 MEDICATIONS: None ANESTHESIA/SEDATION: Moderate (conscious) sedation was employed during this procedure. A total of  Versed 0.5 mg was administered intravenously. Moderate Sedation Time: 60 minutes. The patient's level of consciousness and vital signs were monitored continuously by radiology nursing throughout the procedure under my direct supervision. CONTRAST:  45 cc Omnipaque 300 FLUOROSCOPY TIME:  19 minutes (443 mGy) COMPLICATIONS: SIR Level A - No therapy, no consequence. Procedure complicated by development of a small hematoma at the right groin access site. PROCEDURE: Informed consent was obtained from the patient following explanation of the procedure, risks, benefits and alternatives. All questions were addressed. A time out was performed prior to the initiation of the procedure. Maximal barrier sterile technique utilized including caps, mask, sterile gowns, sterile gloves, large sterile drape, hand hygiene, and Betadine prep. The right femoral head was marked fluoroscopically. Under sterile conditions and local anesthesia, the right common femoral artery access was performed with a micropuncture needle. Under direct ultrasound guidance, the right common femoral was accessed with a micropuncture kit. An ultrasound image was saved for documentation purposes. This allowed for placement of a 5-French vascular sheath. A limited arteriogram was performed through the side arm of the sheath confirming appropriate access within the right common femoral artery. Over a Bentson wire, a [employer]  catheter was advanced the caudal aspect of the thoracic aorta where was reformed, back bled and flushed. The Mickelson catheter was then utilized to select the inferior mesenteric artery and a selective superior mesenteric arteriogram was performed. With the use of a fathom 14 microwire, a regular Renegade microcatheter was advanced to the level of the left colic artery and a selective left colic arteriogram was performed. The microcatheter was then utilized to select the inferior division of the left colic artery and a sub selective  arteriogram was performed. The microcatheter was then advanced to select the distal arcade supplying the ill-defined area of extravasation involving the mid descending colon. Sub selective injection confirmed appropriate positioning and the vessel was percutaneously coil embolized with two overlapping 2 mm diameter interlock coils. The microcatheter was retracted to the level the left colic artery and a post embolization left colic arteriogram was performed. The microcatheter was then advanced to select the distal arcade felt to potentially be supplying additional collateral supply to the ill-defined area of active extravasation involving the mid descending colon. Sub selective injection confirmed appropriate positioning and the vessel was percutaneously coil embolized with two overlapping 2 mm diameter interlock coils. The microcatheter was then retracted to the to the level pelvic artery and a selective left colic arteriogram was performed. Microcatheter was removed and a post embolization arteriogram was performed at the level of the IMA Images were reviewed and the procedure was terminated. All wires, catheters and sheaths were removed from the patient. Hemostasis was achieved at the right groin access site with manual compression. Procedure was complicated by development of a small hematoma at the right groin access site. The patient otherwise tolerated the procedure well without immediate post procedural complication. FINDINGS: Selective inferior mesenteric arteriogram demonstrates conventional branching pattern with ill-defined area of extravasation involving the mid descending colon compatible with findings on preceding CTA. Selective left colic arteriogram redemonstrates the ill-defined area of extravasation within the mid descending colon supplied predominantly via the inferior division of the left colic artery with potential collateral supply from the superior division. Sub selective arteriogram of the  inferior division the left colic artery confirms supply to the ill-defined area of extravasation and as such the supplying distal arcade was percutaneously coil embolized. Sub selective arteriogram of the superior division of the left colic artery suggests additional collateral supply to the ill-defined area of extravasation and as such the supplying distal arcade was percutaneously coil embolized. Completion left colic and inferior mesenteric arteriograms are negative for residual areas of extravasation or collateral supply to the mid descending colon. Note, superior mesenteric arteriogram was not performed given exhaustive evaluation on dedicated mesenteric arteriogram performed 3 days prior (01/09/2021). IMPRESSION: Technically successful percutaneous coil embolization of distal arcades arising from both the superior and inferior divisions of the left colic artery supplying the ill-defined area of intraluminal contrast extravasation involving the mid descending colon compatible with the findings seen on preceding CTA. PLAN: - The patient is to remain flat for 4 hours with right leg straight (until at least 0730). - The patient will continue to experience several additional bloody bowel movements and may continue to require additional resuscitation (as the patient was bleeding both before and during the procedure), however ultimately I am hopeful she will stabilize in the coming days. Electronically Signed   By: Sandi Mariscal M.D.   On: 01/12/2021 07:24   IR US Guide Vasc Access Right  Result Date: 01/12/2021 INDICATION: Recurrent lower GI bleeding. Positive CTA. Please perform mesenteric  arteriogram with potential embolization. EXAM: 1. ULTRASOUND GUIDANCE FOR ARTERIAL ACCESS 2. SELECTIVE INFERIOR MESENTERIC ARTERIOGRAM 3. SUB SELECTIVE LEFT COLIC ARTERIOGRAM 4. SUB SELECTIVE DISTAL ARCADE DIVISIONS OF THE LEFT COLIC ARTERY AND PERCUTANEOUS COIL EMBOLIZATION COMPARISON:  CTA abdomen and pelvis - 01/11/2021  Mesenteric arteriogram - 01/09/2021 Nuclear medicine tagged red blood cell bleeding scan - 01/08/2021 CT abdomen and pelvis - 01/08/2021 MEDICATIONS: None ANESTHESIA/SEDATION: Moderate (conscious) sedation was employed during this procedure. A total of Versed 0.5 mg was administered intravenously. Moderate Sedation Time: 60 minutes. The patient's level of consciousness and vital signs were monitored continuously by radiology nursing throughout the procedure under my direct supervision. CONTRAST:  45 cc Omnipaque 300 FLUOROSCOPY TIME:  19 minutes (130 mGy) COMPLICATIONS: SIR Level A - No therapy, no consequence. Procedure complicated by development of a small hematoma at the right groin access site. PROCEDURE: Informed consent was obtained from the patient following explanation of the procedure, risks, benefits and alternatives. All questions were addressed. A time out was performed prior to the initiation of the procedure. Maximal barrier sterile technique utilized including caps, mask, sterile gowns, sterile gloves, large sterile drape, hand hygiene, and Betadine prep. The right femoral head was marked fluoroscopically. Under sterile conditions and local anesthesia, the right common femoral artery access was performed with a micropuncture needle. Under direct ultrasound guidance, the right common femoral was accessed with a micropuncture kit. An ultrasound image was saved for documentation purposes. This allowed for placement of a 5-French vascular sheath. A limited arteriogram was performed through the side arm of the sheath confirming appropriate access within the right common femoral artery. Over a Bentson wire, a Mickelson catheter was advanced the caudal aspect of the thoracic aorta where was reformed, back bled and flushed. The Mickelson catheter was then utilized to select the inferior mesenteric artery and a selective superior mesenteric arteriogram was performed. With the use of a fathom 14 microwire, a  regular Renegade microcatheter was advanced to the level of the left colic artery and a selective left colic arteriogram was performed. The microcatheter was then utilized to select the inferior division of the left colic artery and a sub selective arteriogram was performed. The microcatheter was then advanced to select the distal arcade supplying the ill-defined area of extravasation involving the mid descending colon. Sub selective injection confirmed appropriate positioning and the vessel was percutaneously coil embolized with two overlapping 2 mm diameter interlock coils. The microcatheter was retracted to the level the left colic artery and a post embolization left colic arteriogram was performed. The microcatheter was then advanced to select the distal arcade felt to potentially be supplying additional collateral supply to the ill-defined area of active extravasation involving the mid descending colon. Sub selective injection confirmed appropriate positioning and the vessel was percutaneously coil embolized with two overlapping 2 mm diameter interlock coils. The microcatheter was then retracted to the to the level pelvic artery and a selective left colic arteriogram was performed. Microcatheter was removed and a post embolization arteriogram was performed at the level of the IMA Images were reviewed and the procedure was terminated. All wires, catheters and sheaths were removed from the patient. Hemostasis was achieved at the right groin access site with manual compression. Procedure was complicated by development of a small hematoma at the right groin access site. The patient otherwise tolerated the procedure well without immediate post procedural complication. FINDINGS: Selective inferior mesenteric arteriogram demonstrates conventional branching pattern with ill-defined area of extravasation involving the mid descending colon compatible  with findings on preceding CTA. Selective left colic arteriogram  redemonstrates the ill-defined area of extravasation within the mid descending colon supplied predominantly via the inferior division of the left colic artery with potential collateral supply from the superior division. Sub selective arteriogram of the inferior division the left colic artery confirms supply to the ill-defined area of extravasation and as such the supplying distal arcade was percutaneously coil embolized. Sub selective arteriogram of the superior division of the left colic artery suggests additional collateral supply to the ill-defined area of extravasation and as such the supplying distal arcade was percutaneously coil embolized. Completion left colic and inferior mesenteric arteriograms are negative for residual areas of extravasation or collateral supply to the mid descending colon. Note, superior mesenteric arteriogram was not performed given exhaustive evaluation on dedicated mesenteric arteriogram performed 3 days prior (01/09/2021). IMPRESSION: Technically successful percutaneous coil embolization of distal arcades arising from both the superior and inferior divisions of the left colic artery supplying the ill-defined area of intraluminal contrast extravasation involving the mid descending colon compatible with the findings seen on preceding CTA. PLAN: - The patient is to remain flat for 4 hours with right leg straight (until at least 0730). - The patient will continue to experience several additional bloody bowel movements and may continue to require additional resuscitation (as the patient was bleeding both before and during the procedure), however ultimately I am hopeful she will stabilize in the coming days. Electronically Signed   By: Sandi Mariscal M.D.   On: 01/12/2021 07:24   IR EMBO ARTERIAL NOT HEMORR HEMANG INC GUIDE ROADMAPPING  Result Date: 01/12/2021 INDICATION: Recurrent lower GI bleeding. Positive CTA. Please perform mesenteric arteriogram with potential embolization. EXAM: 1.  ULTRASOUND GUIDANCE FOR ARTERIAL ACCESS 2. SELECTIVE INFERIOR MESENTERIC ARTERIOGRAM 3. SUB SELECTIVE LEFT COLIC ARTERIOGRAM 4. SUB SELECTIVE DISTAL ARCADE DIVISIONS OF THE LEFT COLIC ARTERY AND PERCUTANEOUS COIL EMBOLIZATION COMPARISON:  CTA abdomen and pelvis - 01/11/2021 Mesenteric arteriogram - 01/09/2021 Nuclear medicine tagged red blood cell bleeding scan - 01/08/2021 CT abdomen and pelvis - 01/08/2021 MEDICATIONS: None ANESTHESIA/SEDATION: Moderate (conscious) sedation was employed during this procedure. A total of Versed 0.5 mg was administered intravenously. Moderate Sedation Time: 60 minutes. The patient's level of consciousness and vital signs were monitored continuously by radiology nursing throughout the procedure under my direct supervision. CONTRAST:  45 cc Omnipaque 300 FLUOROSCOPY TIME:  19 minutes (686 mGy) COMPLICATIONS: SIR Level A - No therapy, no consequence. Procedure complicated by development of a small hematoma at the right groin access site. PROCEDURE: Informed consent was obtained from the patient following explanation of the procedure, risks, benefits and alternatives. All questions were addressed. A time out was performed prior to the initiation of the procedure. Maximal barrier sterile technique utilized including caps, mask, sterile gowns, sterile gloves, large sterile drape, hand hygiene, and Betadine prep. The right femoral head was marked fluoroscopically. Under sterile conditions and local anesthesia, the right common femoral artery access was performed with a micropuncture needle. Under direct ultrasound guidance, the right common femoral was accessed with a micropuncture kit. An ultrasound image was saved for documentation purposes. This allowed for placement of a 5-French vascular sheath. A limited arteriogram was performed through the side arm of the sheath confirming appropriate access within the right common femoral artery. Over a Bentson wire, a Mickelson catheter was  advanced the caudal aspect of the thoracic aorta where was reformed, back bled and flushed. The Mickelson catheter was then utilized to select the inferior mesenteric  artery and a selective superior mesenteric arteriogram was performed. With the use of a fathom 14 microwire, a regular Renegade microcatheter was advanced to the level of the left colic artery and a selective left colic arteriogram was performed. The microcatheter was then utilized to select the inferior division of the left colic artery and a sub selective arteriogram was performed. The microcatheter was then advanced to select the distal arcade supplying the ill-defined area of extravasation involving the mid descending colon. Sub selective injection confirmed appropriate positioning and the vessel was percutaneously coil embolized with two overlapping 2 mm diameter interlock coils. The microcatheter was retracted to the level the left colic artery and a post embolization left colic arteriogram was performed. The microcatheter was then advanced to select the distal arcade felt to potentially be supplying additional collateral supply to the ill-defined area of active extravasation involving the mid descending colon. Sub selective injection confirmed appropriate positioning and the vessel was percutaneously coil embolized with two overlapping 2 mm diameter interlock coils. The microcatheter was then retracted to the to the level pelvic artery and a selective left colic arteriogram was performed. Microcatheter was removed and a post embolization arteriogram was performed at the level of the IMA Images were reviewed and the procedure was terminated. All wires, catheters and sheaths were removed from the patient. Hemostasis was achieved at the right groin access site with manual compression. Procedure was complicated by development of a small hematoma at the right groin access site. The patient otherwise tolerated the procedure well without immediate post  procedural complication. FINDINGS: Selective inferior mesenteric arteriogram demonstrates conventional branching pattern with ill-defined area of extravasation involving the mid descending colon compatible with findings on preceding CTA. Selective left colic arteriogram redemonstrates the ill-defined area of extravasation within the mid descending colon supplied predominantly via the inferior division of the left colic artery with potential collateral supply from the superior division. Sub selective arteriogram of the inferior division the left colic artery confirms supply to the ill-defined area of extravasation and as such the supplying distal arcade was percutaneously coil embolized. Sub selective arteriogram of the superior division of the left colic artery suggests additional collateral supply to the ill-defined area of extravasation and as such the supplying distal arcade was percutaneously coil embolized. Completion left colic and inferior mesenteric arteriograms are negative for residual areas of extravasation or collateral supply to the mid descending colon. Note, superior mesenteric arteriogram was not performed given exhaustive evaluation on dedicated mesenteric arteriogram performed 3 days prior (01/09/2021). IMPRESSION: Technically successful percutaneous coil embolization of distal arcades arising from both the superior and inferior divisions of the left colic artery supplying the ill-defined area of intraluminal contrast extravasation involving the mid descending colon compatible with the findings seen on preceding CTA. PLAN: - The patient is to remain flat for 4 hours with right leg straight (until at least 0730). - The patient will continue to experience several additional bloody bowel movements and may continue to require additional resuscitation (as the patient was bleeding both before and during the procedure), however ultimately I am hopeful she will stabilize in the coming days. Electronically  Signed   By: Sandi Mariscal M.D.   On: 01/12/2021 07:24   CT Angio Abd/Pel W and/or Wo Contrast  Result Date: 01/11/2021 CLINICAL DATA:  Gastrointestinal bleed.  bright red blood. EXAM: CTA ABDOMEN AND PELVIS WITHOUT AND WITH CONTRAST TECHNIQUE: Multidetector CT imaging of the abdomen and pelvis was performed using the standard protocol during bolus administration of  intravenous contrast. Multiplanar reconstructed images and MIPs were obtained and reviewed to evaluate the vascular anatomy. CONTRAST:  115m OMNIPAQUE IOHEXOL 350 MG/ML SOLN COMPARISON:  Nuclear medicine gastrointestinal bleeding scan 01/08/2021, CT abdomen pelvis 01/08/2021 FINDINGS: VASCULAR Aorta: Severe atherosclerotic plaque. Normal caliber aorta without aneurysm, dissection, vasculitis or significant stenosis. Celiac: Patent without evidence of aneurysm, dissection, vasculitis or significant stenosis. SMA: Patent without evidence of aneurysm, dissection, vasculitis or significant stenosis. Renals: Mild atherosclerotic plaque of the origins. Both renal arteries are patent without evidence of aneurysm, dissection, vasculitis, fibromuscular dysplasia or significant stenosis. IMA: Patent without evidence of aneurysm, dissection, vasculitis or significant stenosis. Inflow: Severe atherosclerotic plaque. Patent without evidence of aneurysm, dissection, vasculitis or significant stenosis. Proximal Outflow: At least moderate atherosclerotic plaque. Bilateral common femoral and visualized portions of the superficial and profunda femoral arteries are patent without evidence of aneurysm, dissection, vasculitis or significant stenosis. Veins: The hepatic, portal, splenic, superior mesenteric veins are patent. NON-VASCULAR Lower chest: Interval development of a 1.6 x 1.3 cm right lower lobe pulmonary nodular like consolidation likely represents atelectasis (not visualized on CT 2 days ago. Subsegmental atelectasis within bilateral lower lobes. Trace left  pleural effusion. Hepatobiliary: No focal liver abnormality. No gallstones, gallbladder wall thickening, or pericholecystic fluid. No biliary dilatation. Pancreas: No focal lesion. Normal pancreatic contour. No surrounding inflammatory changes. No main pancreatic ductal dilatation. Spleen: Normal in size without focal abnormality. Adrenals/Urinary Tract: No adrenal nodule bilaterally. Bilateral kidneys enhance symmetrically. Subcentimeter hypodensities within the kidneys are too small to characterize. No hydronephrosis. No hydroureter. The urinary bladder is unremarkable. Stomach/Bowel: Left distal descending colon/sigmoid colon reanastomosis. Layering hyperdensity within the mid to distal descending colon is noted to slightly increase in density on the portal venous phase but is also not visualized on noncontrast study. Associated slightly hyperdense material within the colonic lumen distally. The colon is noted to be fluid-filled past the splenic flexure. No bowel wall thickening or dilatation. The appendix not definitely identified. Lymphatic: No lymphadenopathy. Reproductive: Status post hysterectomy. No adnexal masses. Other: No intraperitoneal free fluid. No intraperitoneal free gas. No organized fluid collection. Musculoskeletal: No abdominal wall hernia or abnormality. No suspicious lytic or blastic osseous lesions. No acute displaced fracture. Multilevel degenerative changes of the spine. 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 IMPRESSION: VASCULAR Aortic Atherosclerosis (ICD10-I70.0). NON-VASCULAR Mid to distal descending colon gastrointestinal hemorrhage that is located proximal to the colonic anastomosis. Electronically Signed   By: MIven FinnM.D.   On: 01/11/2021 22:36   Scheduled Meds: . sodium chloride   Intravenous Once  . sodium chloride   Intravenous Once  . budesonide  6 mg Oral Daily  . calcium carbonate  1,250 mg Oral Daily  .  Chlorhexidine Gluconate Cloth  6 each Topical Daily  . ferrous sulfate  325 mg Oral BID  . pantoprazole (PROTONIX) IV  40 mg Intravenous Q12H  . pravastatin  10 mg Oral Daily   Continuous Infusions: . lactated ringers 50 mL/hr at 01/12/21 2204    LOS: 2 days   OKerney Elbe DO [facility] PAGER is on ALea If 7PM-7AM, please contact night-coverage [email or web address]

## 2021-01-13 NOTE — Progress Notes (Signed)
Patient had a 2 degree increase in temperature 15 minutes into a blood transfusion.  Blood stopped, NS infusing and MD contacted via amion.

## 2021-01-13 NOTE — Progress Notes (Signed)
Cross-coverage note:  Patient seen for lower GI bleeding. Patient admitted with recurrent lower GIB underwent IR embolization yesterday and was transfused 1 unit RBC. She had two large bloody BMs this shift with clots.   HR is 110 SBP 94. She is alert with no pallor or chest pain. Plan to give 250 cc NS bolus and transfuse another 1 unit rbc. Discussed with patient and RN. Patient asked that I call her daughter; discussed with Tacey Ruiz by phone.

## 2021-01-13 NOTE — Evaluation (Signed)
Occupational Therapy Evaluation Patient Details Name: Kathleen Pope MRN: [id number] DOB: [birth year] Today's Date: 01/13/2021    History of Present Illness Kathleen Pope is a 87 y.o. 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   Clinical Impression   Patient lives alone in single level house, I at baseline. DTR lives next door and checks in on patient daily. Patient demonstrates independence with LB dressing, toilet transfer and functional ambulation in room without physical assistance. Pt reports feeling close to her baseline, is waiting on another blood transfusion "then I hope I can go home." No further acute OT needs at this time, will discontinue services. Please re-consult if new needs arise.     Follow Up Recommendations  No OT follow up    Equipment Recommendations  None recommended by OT       Precautions / Restrictions Restrictions Weight Bearing Restrictions: No      Mobility Bed Mobility               General bed mobility comments: in chair. educated pt on bracing with pillow when getting OOB reports abdominal pain from procedure yesterday    Transfers Overall transfer level: Modified independent Equipment used: None Transfers: Sit to/from Stand Sit to Stand: Modified independent (Device/Increase time)              Balance Overall balance assessment: Modified Independent                                         ADL either performed or assessed with clinical judgement   ADL Overall ADL's : Modified independent;At baseline                                       General ADL Comments: patient demonstrates LB dressing, toilet transfer and functional ambulation in room without physical assistance, no report of dizziness. reports feeling very close to her baseline  with mobility                  Pertinent Vitals/Pain Pain Assessment: Faces Faces Pain Scale: Hurts little more Pain Location: abdomen Pain Descriptors / Indicators: Sore Pain Intervention(s): Monitored during session     Hand Dominance Right   Extremity/Trunk Assessment Upper Extremity Assessment Upper Extremity Assessment: Overall WFL for tasks assessed   Lower Extremity Assessment Lower Extremity Assessment: Defer to PT evaluation   Cervical / Trunk Assessment Cervical / Trunk Assessment: Normal   Communication Communication Communication: No difficulties   Cognition Arousal/Alertness: Awake/alert Behavior During Therapy: WFL for tasks assessed/performed Overall Cognitive Status: Within Functional Limits for tasks assessed                                                Home Living Family/patient expects to be discharged to:: Private residence Living Arrangements: Alone Available Help at Discharge: Family;Available 24 hours/day Type of Home: House Home Access: Stairs to enter [employer] of Steps: 1 Entrance Stairs-Rails: None Home Layout: One level     Bathroom Shower/Tub: Walk-in shower  Bathroom Toilet: Standard     Home Equipment: Marine scientist - single point          Prior Functioning/Environment Level of Independence: Independent        Comments: Pt reports being independent with ambulation and ADLs, no AD use, denies falls, retired daughter lives next door and calls pt multiple times a day.        OT Problem List: Pain         OT Goals(Current goals can be found in the care plan section) Acute Rehab OT Goals Patient Stated Goal: return home OT Goal Formulation: All assessment and education complete, DC therapy   AM-PAC OT "6 Clicks" Daily Activity     Outcome Measure Help from another person eating meals?: None Help from another person taking care of personal grooming?: None Help from another  person toileting, which includes using toliet, bedpan, or urinal?: None Help from another person bathing (including washing, rinsing, drying)?: None Help from another person to put on and taking off regular upper body clothing?: None Help from another person to put on and taking off regular lower body clothing?: None 6 Click Score: 24   End of Session Nurse Communication: Mobility status  Activity Tolerance: Patient tolerated treatment well Patient left: in chair;with call bell/phone within reach  OT Visit Diagnosis: Pain Pain - Right/Left: Left Pain - part of body:  (abdomen)                Time: 1610-9604 OT Time Calculation (min): 13 min Charges:  OT General Charges $OT Visit: 1 Visit OT Evaluation $OT Eval Low Complexity: 1 Low  Delbert Phenix OT OT pager: [place] 01/13/2021, 11:56 AM

## 2021-01-14 ENCOUNTER — Inpatient Hospital Stay (HOSPITAL_COMMUNITY): Payer: Medicare Other | Admitting: Certified Registered Nurse Anesthetist

## 2021-01-14 ENCOUNTER — Encounter (HOSPITAL_COMMUNITY): Admission: EM | Disposition: A | Discharge status: Home Health | Payer: Self-pay | Source: Home / Self Care

## 2021-01-14 ENCOUNTER — Encounter (HOSPITAL_COMMUNITY): Payer: Self-pay | Admitting: Internal Medicine

## 2021-01-14 DIAGNOSIS — K559 Vascular disorder of intestine, unspecified: Secondary | ICD-10-CM

## 2021-01-14 DIAGNOSIS — D62 Acute posthemorrhagic anemia: Secondary | ICD-10-CM | POA: Diagnosis not present

## 2021-01-14 DIAGNOSIS — K5520 Angiodysplasia of colon without hemorrhage: Secondary | ICD-10-CM | POA: Diagnosis not present

## 2021-01-14 DIAGNOSIS — K633 Ulcer of intestine: Secondary | ICD-10-CM

## 2021-01-14 DIAGNOSIS — K222 Esophageal obstruction: Secondary | ICD-10-CM

## 2021-01-14 DIAGNOSIS — K922 Gastrointestinal hemorrhage, unspecified: Secondary | ICD-10-CM | POA: Diagnosis not present

## 2021-01-14 DIAGNOSIS — I10 Essential (primary) hypertension: Secondary | ICD-10-CM | POA: Diagnosis not present

## 2021-01-14 DIAGNOSIS — K625 Hemorrhage of anus and rectum: Secondary | ICD-10-CM | POA: Diagnosis not present

## 2021-01-14 DIAGNOSIS — K921 Melena: Secondary | ICD-10-CM | POA: Diagnosis not present

## 2021-01-14 HISTORY — PX: ESOPHAGOGASTRODUODENOSCOPY (EGD) WITH PROPOFOL: SHX5813

## 2021-01-14 HISTORY — PX: FLEXIBLE SIGMOIDOSCOPY: SHX5431

## 2021-01-14 LAB — POCT I-STAT, CHEM 8
BUN: 15 mg/dL (ref 8–23)
Calcium, Ion: 1.03 mmol/L — ABNORMAL LOW (ref 1.15–1.40)
Chloride: 101 mmol/L (ref 98–111)
Creatinine, Ser: 0.7 mg/dL (ref 0.44–1.00)
Glucose, Bld: 103 mg/dL — ABNORMAL HIGH (ref 70–99)
HCT: 22 % — ABNORMAL LOW (ref 36.0–46.0)
Hemoglobin: 7.5 g/dL — ABNORMAL LOW (ref 12.0–15.0)
Potassium: 3.9 mmol/L (ref 3.5–5.1)
Sodium: 135 mmol/L (ref 135–145)
TCO2: 22 mmol/L (ref 22–32)

## 2021-01-14 LAB — COMPREHENSIVE METABOLIC PANEL
ALT: 10 U/L (ref 0–44)
AST: 15 U/L (ref 15–41)
Albumin: 2.1 g/dL — ABNORMAL LOW (ref 3.5–5.0)
Alkaline Phosphatase: 38 U/L (ref 38–126)
Anion gap: 6 (ref 5–15)
BUN: 16 mg/dL (ref 8–23)
CO2: 24 mmol/L (ref 22–32)
Calcium: 7.3 mg/dL — ABNORMAL LOW (ref 8.9–10.3)
Chloride: 106 mmol/L (ref 98–111)
Creatinine, Ser: 0.76 mg/dL (ref 0.44–1.00)
GFR, Estimated: >60 mL/min (ref >60)
Glucose, Bld: 114 mg/dL — ABNORMAL HIGH (ref 70–99)
Potassium: 4.1 mmol/L (ref 3.5–5.1)
Sodium: 136 mmol/L (ref 135–145)
Total Bilirubin: 1 mg/dL (ref 0.3–1.2)
Total Protein: 4 g/dL — ABNORMAL LOW (ref 6.5–8.1)

## 2021-01-14 LAB — CBC WITH DIFFERENTIAL/PLATELET
Abs Immature Granulocytes: 0.11 10*3/uL — ABNORMAL HIGH (ref 0.00–0.07)
Basophils Absolute: 0 10*3/uL (ref 0.0–0.1)
Basophils Relative: 0 %
Eosinophils Absolute: 0.1 10*3/uL (ref 0.0–0.5)
Eosinophils Relative: 1 %
HCT: 25.3 % — ABNORMAL LOW (ref 36.0–46.0)
Hemoglobin: 8.3 g/dL — ABNORMAL LOW (ref 12.0–15.0)
Immature Granulocytes: 1 %
Lymphocytes Relative: 8 %
Lymphs Abs: 1.4 10*3/uL (ref 0.7–4.0)
MCH: 29.5 pg (ref 26.0–34.0)
MCHC: 32.8 g/dL (ref 30.0–36.0)
MCV: 90 fL (ref 80.0–100.0)
Monocytes Absolute: 2.1 10*3/uL — ABNORMAL HIGH (ref 0.1–1.0)
Monocytes Relative: 12 %
Neutro Abs: 13.3 10*3/uL — ABNORMAL HIGH (ref 1.7–7.7)
Neutrophils Relative %: 78 %
Platelets: 160 10*3/uL (ref 150–400)
RBC: 2.81 MIL/uL — ABNORMAL LOW (ref 3.87–5.11)
RDW: 17.9 % — ABNORMAL HIGH (ref 11.5–15.5)
WBC: 17 10*3/uL — ABNORMAL HIGH (ref 4.0–10.5)
nRBC: 0 % (ref 0.0–0.2)

## 2021-01-14 LAB — HEMOGLOBIN AND HEMATOCRIT, BLOOD
HCT: 24.6 % — ABNORMAL LOW (ref 36.0–46.0)
HCT: 22.4 % — ABNORMAL LOW (ref 36.0–46.0)
Hemoglobin: 8.2 g/dL — ABNORMAL LOW (ref 12.0–15.0)
Hemoglobin: 7.7 g/dL — ABNORMAL LOW (ref 12.0–15.0)

## 2021-01-14 LAB — HEMOGLOBIN A1C
Hgb A1c MFr Bld: 7.4 % — ABNORMAL HIGH (ref 4.8–5.6)
Mean Plasma Glucose: 165.68 mg/dL

## 2021-01-14 LAB — MAGNESIUM: Magnesium: 1.8 mg/dL (ref 1.7–2.4)

## 2021-01-14 LAB — PHOSPHORUS: Phosphorus: 4.4 mg/dL (ref 2.5–4.6)

## 2021-01-14 SURGERY — ESOPHAGOGASTRODUODENOSCOPY (EGD) WITH PROPOFOL
Anesthesia: Monitor Anesthesia Care

## 2021-01-14 MED ORDER — PHENYLEPHRINE HCL-NACL 10-0.9 MG/250ML-% IV SOLN
INTRAVENOUS | Status: DC | PRN
  Administered 2021-01-14: 35 ug/min via INTRAVENOUS

## 2021-01-14 MED ORDER — PHENYLEPHRINE HCL-NACL 10-0.9 MG/250ML-% IV SOLN: INTRAVENOUS | Status: DC | PRN

## 2021-01-14 MED ORDER — SODIUM CHLORIDE 0.9 % IV BOLUS
500.0000 mL | Freq: Once | INTRAVENOUS | Status: AC
  Administered 2021-01-14: 500 mL via INTRAVENOUS

## 2021-01-14 MED ORDER — PROPOFOL 10 MG/ML IV BOLUS
INTRAVENOUS | Status: DC | PRN
  Administered 2021-01-14: 20 mg via INTRAVENOUS

## 2021-01-14 MED ORDER — DEXTROSE-NACL 5-0.9 % IV SOLN: INTRAVENOUS | Status: AC

## 2021-01-14 MED ORDER — PROPOFOL 500 MG/50ML IV EMUL
INTRAVENOUS | Status: DC | PRN
  Administered 2021-01-14: 100 ug/kg/min via INTRAVENOUS

## 2021-01-14 MED ORDER — PHENYLEPHRINE 40 MCG/ML (10ML) SYRINGE FOR IV PUSH (FOR BLOOD PRESSURE SUPPORT)
PREFILLED_SYRINGE | INTRAVENOUS | Status: DC | PRN
  Administered 2021-01-14: 80 ug via INTRAVENOUS

## 2021-01-14 MED ORDER — SODIUM CHLORIDE 0.9 % IV SOLN: INTRAVENOUS | Status: DC | PRN

## 2021-01-14 MED ORDER — LACTATED RINGERS IV SOLN: INTRAVENOUS | Status: DC | PRN

## 2021-01-14 NOTE — Addendum Note (Signed)
Addendum  created 01/14/21 1653 by Duane Boston, MD   Clinical Note Signed

## 2021-01-14 NOTE — Anesthesia Procedure Notes (Signed)
Date/Time: 01/14/2021 3:47 PM Performed by: Talbot Grumbling, CRNA Oxygen Delivery Method: Simple face mask

## 2021-01-14 NOTE — Progress Notes (Signed)
Pt passed black stool. Liquid consistency. Pt asymptomatic.

## 2021-01-14 NOTE — Anesthesia Postprocedure Evaluation (Addendum)
Anesthesia Post Note  Patient: Kathleen Pope  Procedure(s) Performed: COLONOSCOPY WITH PROPOFOL (N/A ) ESOPHAGOGASTRODUODENOSCOPY (EGD) WITH PROPOFOL (N/A )     Patient location during evaluation: Endoscopy Anesthesia Type: MAC Level of consciousness: awake and alert Pain management: pain level controlled Vital Signs Assessment: post-procedure vital signs reviewed and stable Respiratory status: spontaneous breathing and respiratory function stable Cardiovascular status: stable Postop Assessment: no apparent nausea or vomiting Anesthetic complications: no   No complications documented.                SINGER,JAMES DANIEL

## 2021-01-14 NOTE — Progress Notes (Signed)
Full EGD and sigmoidoscopy report in epic  No source of bleeding on EGD  Severe left-sided ischemic colitis with necrotic mucosa and stigmata of bleeding as well as copious blood in the lumen.  Limited visualization.  Scope could not be advanced past about 20 to 25 cm from the anal verge due to the severity of colitis and related edema.  Patient's i-STAT hemoglobin came back at 7.5, and an additional unit of PRBCs is being transfused.  This patient's daughter will be called with an update, and an urgent surgical consultation will be obtained.  - H. Loletha Carrow, MD

## 2021-01-14 NOTE — Op Note (Signed)
[facility] Patient Name: Kathleen Pope Procedure Date: 01/14/2021 MRN: [id number] Attending MD: Estill Cotta. Loletha Carrow , MD Date of Birth: Feb 21, 1933 CSN: [id number] Age: 87 Admit Type: Inpatient Procedure:                Upper GI Endoscopy Indications:              Acute post hemorrhagic anemia, Hematemesis, Melena Providers:                Mallie Mussel L. Loletha Carrow, MD, Erenest Rasher, RN, Ladona Ridgel, Technician, Margurite Auerbach Referring MD:             [facility] Medicines:                Monitored Anesthesia Care Complications:            No immediate complications. Estimated Blood Loss:     Estimated blood loss: none. Procedure:                Pre-Anesthesia Assessment:                           - Prior to the procedure, a History and Physical                            was performed, and patient medications and                            allergies were reviewed. The patient's tolerance of                            previous anesthesia was also reviewed. The risks                            and benefits of the procedure and the sedation                            options and risks were discussed with the patient.                            All questions were answered, and informed consent                            was obtained. Prior Anticoagulants: The patient has                            taken no previous anticoagulant or antiplatelet                            agents. ASA Grade Assessment: IV - A patient with                            severe systemic disease that is a constant threat  to life. After reviewing the risks and benefits,                            the patient was deemed in satisfactory condition to                            undergo the procedure.                           After obtaining informed consent, the endoscope was                            passed under direct vision. Throughout the                             procedure, the patient's blood pressure, pulse, and                            oxygen saturations were monitored continuously. The                            [id number]-[id number] ([id number]) Olympus pediatric colonscope                            was introduced through the mouth, and advanced to                            the second part of duodenum. After obtaining                            informed consent, the endoscope was passed under                            direct vision. Throughout the procedure, the                            patient's blood pressure, pulse, and oxygen                            saturations were monitored continuously. The upper                            GI endoscopy was accomplished without difficulty.                            The patient tolerated the procedure fairly well. Scope In: Scope Out: Findings:      A widely patent Schatzki ring was found at the gastroesophageal junction.      The stomach was normal.      The cardia and gastric fundus were normal on retroflexion.      The cardia and gastric fundus were normal on retroflexion.      The examined duodenum was normal. Impression:               - Widely patent Schatzki ring.                           -  Normal stomach.                           - Normal examined duodenum.                           - No specimens collected. Moderate Sedation:      MAC sedation used Recommendation:           - Return patient to hospital ward for ongoing care.                           - NPO.                           - See the other procedure note for documentation of                            additional recommendations. Procedure Code(s):        --- Professional ---                           [id number], Esophagogastroduodenoscopy, flexible,                            transoral; diagnostic, including collection of                            specimen(s) by brushing or washing, when performed                             (separate procedure) Diagnosis Code(s):        --- Professional ---                           K22.2, Esophageal obstruction                           D62, Acute posthemorrhagic anemia                           K92.0, Hematemesis                           K92.1, Melena (includes Hematochezia) CPT copyright 2019 American Medical [employer]. All rights reserved. The codes documented in this report are preliminary and upon coder review may  be revised to meet current compliance requirements. Khya Halls L. Loletha Carrow, MD 01/14/2021 4:36:24 PM This report has been signed electronically. Number of Addenda: 0

## 2021-01-14 NOTE — Consult Note (Signed)
During pre-op workup for Endoscopy, harsh systolic murmur noted as mentioned in admission H&P.  The admitting physician noted this murmur and commented that an echocardiogram has been scheduled as an outpatient.  Anesthesiology requests this murmur be investigated prior to the surgery scheduled for tomorrow to assist with intraoperative manah=gement.  I discussed this with [place], NP who will order.

## 2021-01-14 NOTE — Progress Notes (Signed)
Physical Therapy Treatment Patient Details Name: Kathleen Pope MRN: [id number] DOB: 1933/06/05 Today's Date: 01/14/2021    History of Present Illness Shenique DAISHIA FETTERLY is a 87 y.o. 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    PT Comments    Pt requests to trial RW, but reports it is difficult to maneuver so uses IV pole to steady with ambulation in hallway, able to complete turns, direction changes, and maneuver around obstacles without difficulty. Pt reports feeling back to baseline with ambulation, reports having SPC at home that she has used before when needed. Pt ambulates in hallway with IV pole at SUPV, no unsteadiness or LOB. Pt has met all acute PT goals, has good family support at home and will be d/c'd from therapy at this time. Please re-consult PT if needs arise.    Follow Up Recommendations  No PT follow up     Equipment Recommendations  None recommended by PT    Recommendations for Other Services       Precautions / Restrictions Precautions Precautions: None Restrictions Weight Bearing Restrictions: No    Mobility  Bed Mobility Overal bed mobility: Modified Independent  General bed mobility comments: cued for log roll technique to reduce pain in abdomen    Transfers Overall transfer level: Modified independent Equipment used: None   Ambulation/Gait Ambulation/Gait assistance: Supervision Gait Distance (Feet): 180 Feet Assistive device: Rolling walker (2 wheeled);IV Pole Gait Pattern/deviations: Step-through pattern;Decreased stride length Gait velocity: decreased   General Gait Details: Trialed ambulation with RW per pt request, but pt reports difficulty maneuvering and doesn't like RW so switched to IV pole to steady without difficulty, no LOB or unsteadiness   Stairs              Wheelchair Mobility    Modified Rankin (Stroke Patients Only)       Balance Overall balance assessment: Needs assistance  Standing balance support: During functional activity Standing balance-Leahy Scale: Fair Standing balance comment: improved balance with UE support     Cognition Arousal/Alertness: Awake/alert Behavior During Therapy: WFL for tasks assessed/performed Overall Cognitive Status: Within Functional Limits for tasks assessed     Exercises      General Comments General comments (skin integrity, edema, etc.): Pt c/o dizziness upon return to room, BP 115/66, HR 87 and SpO2 98%. Pt reports feeling is similar to when she gets a headache/migraine- RN notified.      Pertinent Vitals/Pain Pain Assessment: Faces Faces Pain Scale: Hurts a little bit Pain Location: abdomen Pain Descriptors / Indicators: Aching Pain Intervention(s): Limited activity within patient's tolerance;Monitored during session    Home Living                      Prior Function            PT Goals (current goals can now be found in the care plan section) Acute Rehab PT Goals Patient Stated Goal: return home PT Goal Formulation: With patient Time For Goal Achievement: 01/26/21 Potential to Achieve Goals: Good Progress towards PT goals: Goals met/education completed, patient discharged from PT    Frequency    Min 3X/week      PT Plan Current plan remains appropriate    Co-evaluation              AM-PAC PT "6 Clicks" Mobility  Outcome Measure  Help needed turning from your back to your side while in a flat bed without using bedrails?: None Help needed moving from lying on your back to sitting on the side of a flat bed without using bedrails?: None Help needed moving to and from a bed to a chair (including a wheelchair)?: None Help needed standing up from a chair using your arms (e.g., wheelchair or bedside chair)?: None Help needed to walk in hospital room?:  None Help needed climbing 3-5 steps with a railing? : None 6 Click Score: 24    End of Session   Activity Tolerance: Patient tolerated treatment well Patient left: in bed;with call bell/phone within reach Nurse Communication: Mobility status;Other (comment) (headache/migraine) PT Visit Diagnosis: Unsteadiness on feet (R26.81);Other abnormalities of gait and mobility (R26.89)     Time: 1115-1130 PT Time Calculation (min) (ACUTE ONLY): 15 min  Charges:  $Gait Training: 8-22 mins                      Tori Broussard PT, DPT 01/14/21, 12:43 PM   

## 2021-01-14 NOTE — Anesthesia Preprocedure Evaluation (Addendum)
Anesthesia Evaluation  Patient identified by MRN, date of birth, ID band Patient awake    Reviewed: Allergy & Precautions, NPO status   History of Anesthesia Complications Negative for: history of anesthetic complications  Airway Mallampati: II  TM Distance: >3 FB Neck ROM: Full    Dental no notable dental hx. (+) Dental Advisory Given   Pulmonary COPD, Current Smoker and Patient abstained from smoking.,    breath sounds clear to auscultation       Cardiovascular hypertension, Pt. on medications  Rhythm:Regular Rate:Tachycardia + Systolic murmurs '13 ECHO: EF 70-75%. no regional wall  motion abnormalities. grade 1 diastolic dysfunction   mild Aortic stenosis. Trivial AI. Mild MR, mild TR     Neuro/Psych negative neurological ROS     GI/Hepatic GERD  Medicated,AVM colon: GI bleed   Endo/Other    Renal/GU      Musculoskeletal   Abdominal   Peds  Hematology  (+) Blood dyscrasia (Hb 8.2), anemia ,   Anesthesia Other Findings   Reproductive/Obstetrics                            Anesthesia Physical Anesthesia Plan  ASA: III  Anesthesia Plan: MAC   Post-op Pain Management:    Induction:   PONV Risk Score and Plan: 1 and Treatment may vary due to age or medical condition and Ondansetron  Airway Management Planned: Natural Airway and Simple Face Mask  Additional Equipment: None  Intra-op Plan:   Post-operative Plan:   Informed Consent: I have reviewed the patients History and Physical, chart, labs and discussed the procedure including the risks, benefits and alternatives for the proposed anesthesia with the patient or authorized representative who has indicated his/her understanding and acceptance.     Dental advisory given  Plan Discussed with: CRNA, Anesthesiologist and Surgeon  Anesthesia Plan Comments:        Anesthesia Quick Evaluation

## 2021-01-14 NOTE — Care Management Important Message (Signed)
Important Message  Patient Details IM Letter given to the Patient Name: Kathleen Pope MRN: [id number] Date of Birth: [birth year]   Medicare Important Message Given:  Yes     [name] 01/14/2021, 11:00 AM

## 2021-01-14 NOTE — Consult Note (Signed)
Reason for Consult: Ischemic colitis Referring Physician: Pincus Sanes MD  Kathleen Pope is an 87 y.o. female.  HPI: Pleasant 87 year old female who is admitted last week due to GI bleeding.  Over the weekend CT angio revealed a source in the descending colon.  She underwent embolization and the bleeding stopped.  She overnight developed more rectal bleeding again.  Flexible sigmoidoscopy showed ischemic necrosis of the descending colon from 15 cm to about 25 cm.  This was also associated with some bleeding.  The scope could not be advanced.  Of note she had a colonoscopy on 01/10/2021 with Dr. Henrene Pastor showed an AVM in her cecum that was ablated.  She also had a few polyps removed as well.  One was from the cecum which was a tubular adenoma and the other was in the ascending colon was a sessile serrated polyp neither had dysplasia or malignancy.  She has a history of COPD and heavy tobacco use.  Currently, she denies any abdominal pain nausea or vomiting.  She feels well currently.  Past Medical History:  Diagnosis Date  . Anemia    pt denies, yet takes iron  . Chronic airway obstruction, not elsewhere classified   . COPD (chronic obstructive pulmonary disease) ([place])   . Diverticulosis   . GI hemorrhage from post-treatment cecal ulcer 08/18/2012  . Hemorrhoids   . HTN (hypertension)   . Hypercholesteremia   . Lower GI bleed multiple   AVM's    Past Surgical History:  Procedure Laterality Date  . BUNIONECTOMY     left  . COLECTOMY    . COLONOSCOPY  08/10/2012   Procedure: COLONOSCOPY;  Surgeon: Ladene Artist, MD,FACG;  Location: [facility];  Service: Endoscopy;  Laterality: N/A;  . COLONOSCOPY  08/19/2012   Procedure: COLONOSCOPY;  Surgeon: Gatha Mayer, MD;  Location: [facility];  Service: Endoscopy;  Laterality: N/A;  . COLONOSCOPY N/A 03/11/2013   Procedure: COLONOSCOPY;  Surgeon: Irene Shipper, MD;  Location: [facility];  Service: Endoscopy;  Laterality: N/A;  . COLONOSCOPY WITH  PROPOFOL N/A 01/10/2021   Procedure: COLONOSCOPY WITH PROPOFOL;  Surgeon: Irene Shipper, MD;  Location: [facility];  Service: Endoscopy;  Laterality: N/A;  . HOT HEMOSTASIS N/A 01/10/2021   Procedure: HOT HEMOSTASIS (ARGON PLASMA COAGULATION/BICAP);  Surgeon: Irene Shipper, MD;  Location: [facility];  Service: Endoscopy;  Laterality: N/A;  . IR ANGIOGRAM SELECTIVE EACH ADDITIONAL VESSEL  01/12/2021  . IR ANGIOGRAM SELECTIVE EACH ADDITIONAL VESSEL  01/12/2021  . IR ANGIOGRAM SELECTIVE EACH ADDITIONAL VESSEL  01/12/2021  . IR ANGIOGRAM VISCERAL SELECTIVE  01/09/2021  . IR ANGIOGRAM VISCERAL SELECTIVE  01/12/2021  . IR EMBO ARTERIAL NOT HEMORR HEMANG INC GUIDE ROADMAPPING  01/12/2021  . IR US GUIDE VASC ACCESS RIGHT  01/09/2021  . IR US GUIDE VASC ACCESS RIGHT  01/12/2021  . POLYPECTOMY  01/10/2021   Procedure: POLYPECTOMY;  Surgeon: Irene Shipper, MD;  Location: [facility];  Service: Endoscopy;;  . TONSILLECTOMY      Family History  Problem Relation Age of Onset  . Breast cancer Mother   . Lung cancer Father   . Breast cancer Sister   . Anuerysm Brother        in stomach  . Colon cancer Neg Hx   . Esophageal cancer Neg Hx   . Rectal cancer Neg Hx     Social History:  reports that she has been smoking cigarettes. She has a 59.00 pack-year smoking history. She has never  used smokeless tobacco. She reports that she does not drink alcohol and does not use drugs.  Allergies: No Known Allergies  Medications: I have reviewed the patient's current medications.  Results for orders placed or performed during the hospital encounter of 01/11/21 (from the past 48 hour(s))  CBC     Status: Abnormal   Collection Time: 01/12/21  7:32 PM  Result Value Ref Range   WBC 16.0 (H) 4.0 - 10.5 K/uL   RBC 2.27 (L) 3.87 - 5.11 MIL/uL   Hemoglobin 7.3 (L) 12.0 - 15.0 g/dL   HCT 21.6 (L) 36.0 - 46.0 %   MCV 95.2 80.0 - 100.0 fL   MCH 32.2 26.0 - 34.0 pg   MCHC 33.8 30.0 - 36.0 g/dL   RDW 18.0 (H) 11.5 -  15.5 %   Platelets 191 150 - 400 K/uL   nRBC 0.0 0.0 - 0.2 %    Comment: Performed at [facility], [place] [address], [place], [place] [address]  CBC with Differential/Platelet     Status: Abnormal   Collection Time: 01/13/21  3:47 AM  Result Value Ref Range   WBC 18.2 (H) 4.0 - 10.5 K/uL   RBC 2.11 (L) 3.87 - 5.11 MIL/uL   Hemoglobin 7.0 (L) 12.0 - 15.0 g/dL    Comment: REPEATED TO VERIFY   HCT 20.2 (L) 36.0 - 46.0 %   MCV 95.7 80.0 - 100.0 fL   MCH 33.2 26.0 - 34.0 pg   MCHC 34.7 30.0 - 36.0 g/dL   RDW 18.0 (H) 11.5 - 15.5 %   Platelets 198 150 - 400 K/uL   nRBC 0.0 0.0 - 0.2 %   Neutrophils Relative % 80 %   Neutro Abs 14.7 (H) 1.7 - 7.7 K/uL   Lymphocytes Relative 8 %   Lymphs Abs 1.4 0.7 - 4.0 K/uL   Monocytes Relative 11 %   Monocytes Absolute 2.0 (H) 0.1 - 1.0 K/uL   Eosinophils Relative 0 %   Eosinophils Absolute 0.1 0.0 - 0.5 K/uL   Basophils Relative 0 %   Basophils Absolute 0.0 0.0 - 0.1 K/uL   Immature Granulocytes 1 %   Abs Immature Granulocytes 0.10 (H) 0.00 - 0.07 K/uL    Comment: Performed at [facility], [place] [address], [place], [place] [address]  Comprehensive metabolic panel     Status: Abnormal   Collection Time: 01/13/21  3:47 AM  Result Value Ref Range   Sodium 140 135 - 145 mmol/L   Potassium 4.6 3.5 - 5.1 mmol/L    Comment: DELTA CHECK NOTED   Chloride 107 98 - 111 mmol/L   CO2 25 22 - 32 mmol/L   Glucose, Bld 121 (H) 70 - 99 mg/dL    Comment: Glucose reference range applies only to samples taken after fasting for at least 8 hours.   BUN 12 8 - 23 mg/dL   Creatinine, Ser 0.74 0.44 - 1.00 mg/dL   Calcium 8.0 (L) 8.9 - 10.3 mg/dL   Total Protein 4.7 (L) 6.5 - 8.1 g/dL   Albumin 2.6 (L) 3.5 - 5.0 g/dL   AST 18 15 - 41 U/L   ALT 11 0 - 44 U/L   Alkaline Phosphatase 39 38 - 126 U/L   Total Bilirubin 0.8 0.3 - 1.2 mg/dL   GFR, Estimated >60 >60 mL/min    Comment: (NOTE) Calculated using the CKD-EPI Creatinine  Equation (2021)    Anion gap 8 5 - 15  Comment: Performed at [facility], [place] [address], [place], [place] [address]  Magnesium     Status: None   Collection Time: 01/13/21  3:47 AM  Result Value Ref Range   Magnesium 1.8 1.7 - 2.4 mg/dL    Comment: Performed at [facility], [place] [address] [place], [place] [address]  Phosphorus     Status: None   Collection Time: 01/13/21  3:47 AM  Result Value Ref Range   Phosphorus 2.6 2.5 - 4.6 mg/dL    Comment: Performed at [facility], [place] [address], [place], [place] [address]  Prepare RBC (crossmatch)     Status: None   Collection Time: 01/13/21  9:53 AM  Result Value Ref Range   Order Confirmation      ORDER PROCESSED BY BLOOD BANK Performed at [facility], [place] [address], [place], [place] [address]   Hemoglobin and hematocrit, blood     Status: Abnormal   Collection Time: 01/13/21  7:21 PM  Result Value Ref Range   Hemoglobin 8.2 (L) 12.0 - 15.0 g/dL   HCT 24.4 (L) 36.0 - 46.0 %    Comment: Performed at [facility], [place] [address] [place], [place] [address]  Prepare RBC (crossmatch)     Status: None   Collection Time: 01/13/21 10:00 PM  Result Value Ref Range   Order Confirmation      ORDER PROCESSED BY BLOOD BANK Performed at [facility], [place] [address], [place], [place] [address]   CBC with Differential/Platelet     Status: Abnormal   Collection Time: 01/14/21  5:19 AM  Result Value Ref Range   WBC 17.0 (H) 4.0 - 10.5 K/uL   RBC 2.81 (L) 3.87 - 5.11 MIL/uL   Hemoglobin 8.3 (L) 12.0 - 15.0 g/dL   HCT 25.3 (L) 36.0 - 46.0 %   MCV 90.0 80.0 - 100.0 fL   MCH 29.5 26.0 - 34.0 pg   MCHC 32.8 30.0 - 36.0 g/dL   RDW 17.9 (H) 11.5 - 15.5 %   Platelets 160 150 - 400 K/uL   nRBC 0.0 0.0 - 0.2 %   Neutrophils Relative % 78 %   Neutro Abs 13.3 (H) 1.7 - 7.7 K/uL   Lymphocytes Relative 8 %   Lymphs Abs 1.4 0.7 -  4.0 K/uL   Monocytes Relative 12 %   Monocytes Absolute 2.1 (H) 0.1 - 1.0 K/uL   Eosinophils Relative 1 %   Eosinophils Absolute 0.1 0.0 - 0.5 K/uL   Basophils Relative 0 %   Basophils Absolute 0.0 0.0 - 0.1 K/uL   Immature Granulocytes 1 %   Abs Immature Granulocytes 0.11 (H) 0.00 - 0.07 K/uL    Comment: Performed at [facility], [place] [address], [place], [place] [address]  Comprehensive metabolic panel     Status: Abnormal   Collection Time: 01/14/21  5:19 AM  Result Value Ref Range   Sodium 136 135 - 145 mmol/L   Potassium 4.1 3.5 - 5.1 mmol/L   Chloride 106 98 - 111 mmol/L   CO2 24 22 - 32 mmol/L   Glucose, Bld 114 (H) 70 - 99 mg/dL    Comment: Glucose reference range applies only to samples taken after fasting for at least 8 hours.   BUN 16 8 - 23 mg/dL   Creatinine, Ser 0.76 0.44 - 1.00 mg/dL   Calcium 7.3 (L) 8.9 - 10.3 mg/dL   Total  Protein 4.0 (L) 6.5 - 8.1 g/dL   Albumin 2.1 (L) 3.5 - 5.0 g/dL   AST 15 15 - 41 U/L   ALT 10 0 - 44 U/L   Alkaline Phosphatase 38 38 - 126 U/L   Total Bilirubin 1.0 0.3 - 1.2 mg/dL   GFR, Estimated >60 >60 mL/min    Comment: (NOTE) Calculated using the CKD-EPI Creatinine Equation (2021)    Anion gap 6 5 - 15    Comment: Performed at [facility], [place] [address], [place], [place] [address]  Magnesium     Status: None   Collection Time: 01/14/21  5:19 AM  Result Value Ref Range   Magnesium 1.8 1.7 - 2.4 mg/dL    Comment: Performed at [facility], [place] [address], [place], [place] [address]  Phosphorus     Status: None   Collection Time: 01/14/21  5:19 AM  Result Value Ref Range   Phosphorus 4.4 2.5 - 4.6 mg/dL    Comment: Performed at [facility], [place] [address], [place], [place] [address]  Hemoglobin and hematocrit, blood     Status: Abnormal   Collection Time: 01/14/21  9:05 AM  Result Value Ref Range   Hemoglobin 8.2 (L) 12.0 - 15.0 g/dL   HCT 24.6 (L)  36.0 - 46.0 %    Comment: Performed at [facility], [place] [address], [place], [place] [address]  I-STAT, [place] [address]     Status: Abnormal   Collection Time: 01/14/21  3:24 PM  Result Value Ref Range   Sodium 135 135 - 145 mmol/L   Potassium 3.9 3.5 - 5.1 mmol/L   Chloride 101 98 - 111 mmol/L   BUN 15 8 - 23 mg/dL   Creatinine, Ser 0.70 0.44 - 1.00 mg/dL   Glucose, Bld 103 (H) 70 - 99 mg/dL    Comment: Glucose reference range applies only to samples taken after fasting for at least 8 hours.   Calcium, Ion 1.03 (L) 1.15 - 1.40 mmol/L   TCO2 22 22 - 32 mmol/L   Hemoglobin 7.5 (L) 12.0 - 15.0 g/dL   HCT 22.0 (L) 36.0 - 46.0 %  Hemoglobin and hematocrit, blood     Status: Abnormal   Collection Time: 01/14/21  3:50 PM  Result Value Ref Range   Hemoglobin 7.7 (L) 12.0 - 15.0 g/dL   HCT 22.4 (L) 36.0 - 46.0 %    Comment: Performed at [facility], [place] [address], [place], [place] [address]    No results found.  Review of Systems  Constitutional: Negative.  Negative for activity change and appetite change.  HENT: Positive for dental problem. Negative for congestion.   Eyes: Negative.   Respiratory: Positive for cough. Negative for shortness of breath.   Cardiovascular: Negative for chest pain and leg swelling.  Gastrointestinal: Positive for anal bleeding and blood in stool. Negative for abdominal pain, constipation, diarrhea and nausea.  Endocrine: Negative for cold intolerance and heat intolerance.  Genitourinary: Negative for difficulty urinating and dyspareunia.  Musculoskeletal: Negative for arthralgias and back pain.  Neurological: Negative for dizziness and facial asymmetry.  Hematological: Negative for adenopathy.  Psychiatric/Behavioral: Negative for agitation and behavioral problems.   Blood pressure (!) 142/62, pulse 86, temperature 98.5 F (36.9 C), temperature source Oral, resp. rate 17, height 5' 1.5" (1.562 m), weight 39.9 kg, SpO2 100  %. Physical Exam Constitutional:      Appearance: Normal appearance.  HENT:  Head: Normocephalic.     Nose: Nose normal.     Mouth/Throat:     Mouth: Mucous membranes are dry.  Eyes:     Extraocular Movements: Extraocular movements intact.     Pupils: Pupils are equal, round, and reactive to light.  Cardiovascular:     Rate and Rhythm: Normal rate and regular rhythm.     Heart sounds: No murmur heard.   Pulmonary:     Effort: Pulmonary effort is normal.     Breath sounds: Normal breath sounds.  Abdominal:     General: Abdomen is flat. There is no distension.     Palpations: Abdomen is soft.     Tenderness: There is no abdominal tenderness. There is no guarding.     Comments: Previous midline scar noted  Musculoskeletal:        General: Normal range of motion.  Skin:    General: Skin is warm.  Neurological:     General: No focal deficit present.     Mental Status: She is alert and oriented to person, place, and time.  Psychiatric:        Mood and Affect: Mood normal.        Behavior: Behavior normal.     Assessment/Plan: Ischemic colitis-history of GI bleeding from descending colon status post angioembolization 2 days ago with sequela of ischemic colitis.  This is 15 to 25 cm from the anal verge according to gastroenterologist.  She is hemodynamically stable and has a benign examination.  I do recommend surgical excision of this with colostomy at this point time.  She is stable and the plan will be for tomorrow since the patient would like to have her daughter and other support system in place including a pastor.  She is stable now but I explained her if her condition were to worsen overnight she may require surgery sooner as well.  Complications were discussed at length.  She has a 03% of serious complication, a 70% risk of pneumonia, a 8% risk of cardiac complication, 48% risk of surgical site infection, 2% risk urinary tract infection, 4% risk of DVT, 3% risk of renal  failure, 30% risk of colonic ileus, readmission rate of 20%, risk of return to OR 10%, risk of death of being 13%, discharged to nursing facility only 70% of sepsis of about 8%.  She understands risks and she has no other option is willing to proceed.  I will discuss with family as well.  Camira Geidel A Evea Sheek 01/14/2021, 5:40 PM

## 2021-01-14 NOTE — Progress Notes (Addendum)
[facility] Progress Note  CC:  Recurrent lower GI bleed  Patient was readmitted to the hospital 01/11/2021 with recurrent lower GI bleed. Admission Hg 7.7. Transfused 1 unit of PRBCs. CTA 2/27 identified active bleeding to the distal descending colon/proximal to the colonic anastomosis. S/P mesenteric arteriogram with successful coil embolization of the left colic artery 2/59.  She received IV Venofer x 1 2/28. On 3/1 Hg 7.3 -> 7.0 and she received 2nd unit of PRBCs -> Hg 8.2. She complained of mild central abdominal soreness since her procedure 2/28, no significant abdominal pain. Nursing staff reported she passed 2 large volume bloody stools with clots around 10pm last evening with HR 110 b/min and SBP in the 90's. She received NS IV 250cc bolus and a 3rd unit of PRBCs. This morning Hg 8.3. Upon entering her room, she was sitting on the commode and she passed about 1/2 cup full of gelatinous dark red blood clot which oozed bright red blood. No stool. She is hemodynamically stable. No CP, dizziness, palpitations or SOB. Stat H/H collected, results pending. NS IV 500cc bolus over 2 hours started. NPO for flexible sigmoidoscopy later this afternoon. She last had clear liquids at 7:30 AM. I spoke with Dr. Serafina Royals IR and he agreed with proceeding with a flexible sigmoidoscopy with recommendations for a repeat abd/pelvic CTA if she has a significant amount of blood in the colon during the flex sig.   Objective:  Vital signs in last 24 hours: Temp:  [97.3 F (36.3 C)-100.3 F (37.9 C)] 98.3 F (36.8 C) (03/02 0530) Pulse Rate:  [81-110] 85 (03/02 0530) Resp:  [15-22] 17 (03/02 0530) BP: (75-123)/(47-66) 102/63 (03/02 0530) SpO2:  [94 %-100 %] 97 % (03/02 0530) Last BM Date: 01/13/21 General: Alert 87 year old female in NAD.  Heart: RRR, 3/6 systolic murmur.  Pulm:  Breath Sounds clear throughout.  Abdomen: Soft, mild tenderness throughout the central abdomen without rebound or  guarding. Positive bowel sounds x 4 quads. Extremities:  Without edema. Neurologic:  Alert and  oriented x4;  grossly normal neurologically. Psych:  Alert and cooperative. Normal mood and affect.  Intake/Output from previous day: 03/01 0701 - 03/02 0700 In: 1919.3 [P.O.:1082; Blood:587; IV Piggyback:250.3] Out: -  Intake/Output this shift: No intake/output data recorded.  Lab Results: Recent Labs    01/12/21 1932 01/13/21 0347 01/13/21 1921 01/14/21 0519  WBC 16.0* 18.2*  --  17.0*  HGB 7.3* 7.0* 8.2* 8.3*  HCT 21.6* 20.2* 24.4* 25.3*  PLT 191 198  --  160   BMET Recent Labs    01/12/21 0700 01/13/21 0347 01/14/21 0519  NA 142 140 136  K 3.3* 4.6 4.1  CL 110 107 106  CO2 23 25 24   GLUCOSE 107* 121* 114*  BUN 16 12 16   CREATININE 0.59 0.74 0.76  CALCIUM 7.3* 8.0* 7.3*   LFT Recent Labs    01/14/21 0519  PROT 4.0*  ALBUMIN 2.1*  AST 15  ALT 10  ALKPHOS 38  BILITOT 1.0   PT/INR No results for input(s): LABPROT, INR in the last 72 hours. Hepatitis Panel No results for input(s): HEPBSAG, HCVAB, HEPAIGM, HEPBIGM in the last 72 hours.  No results found.  Assessment / Plan:  46.  87 year old female with recurrent diverticular bleed. Readmitted to the hospital 2/27 with GI bleeding and anemia. Admission Hg 7.7. Transfused 1 unit of PRBCs. Abd/pelvic CTA 2/27 identified active bleeding to the distal descending colon/proximal to the colonic  anastomosis. S/P mesenteric arteriogram with successful coil embolization of the left colic artery 6/72.  She received IV Venofer x 1 2/28. On Ferrous Sulfate 325mg  po bid. Hg 7.3 -> 7.0. Received 1 unit of PRBCs 3/1 post transfusion Hg 8.2. She passed 2 large volume bloody BMs with blood clots yesterday evening with associated tachycardia HR 110 b/min and hypotension with SBP 94. She received IV fluids and transfused another unit of PRBC (3rd unit of PRBCs since admission) ->  Hg  8.3. She passed 1/2 cup full of dark red gelatinous  blood with clots at 8:30am. She is hemodynamically stable at this time. No CP or SOB. -NPO -Stat H/H -Transfuse for Hg < 7 -H/H Q 6 hours x 24 hrs -NS IV bolus 500cc over 2 hours -Flexible sigmoidoscopy later this afternoon with Dr. Loletha Carrow. Flex sig benefits and risks discussed including risk with sedation, risk of bleeding, perforation and infection -I called the patient's daughter [name] and I discussed the above plan in full detail and answered all of her questions  -Dr. Serafina Royals IR contacted and he agreed with plan for  flex sigmoidoscopy this afternoon. If significant blood found in the colon during flex sig a repeat abd/pelvic CT recommended. No plans for repeat arteriogram/embolization at this time  2. Leukocytosis, most likely from Entocort. WBC 18.2. She is afebrile.   3. Microscopic colitis on Entocort. No diarrhea.   Principal Problem:   Rectal bleed Active Problems:   Chronic lower GI bleeding   AVM (arteriovenous malformation) of colon   HTN (hypertension)   Acute blood loss anemia   GI bleed     LOS: 3 days   Noralyn Pick  01/14/2021, 8:22 AM   I have discussed the case with the PA, and that is the plan I formulated. I would personally interview and examine the patient when I see her for procedure later today.  Recurrent large-volume hematochezia with hypotension last evening.  1 unit PRBCs transfused overnight, though unfortunately no call made to the GI service when the bleeding occurred overnight and patient was seen by hospitalist.  We received a message earlier this morning from the daytime hospitalist.  This is most likely recurrent left-sided colonic diverticular bleeding, less likely bleeding from the cecal AVM ablation site or polypectomy sites from the colonoscopy on February 26.  If no source found and still suspected to be active bleeding at the time of procedure this afternoon, will need additional help from interventional radiology.  They were  contacted earlier today as noted above.    [name] Office: [phone]

## 2021-01-14 NOTE — Op Note (Signed)
[facility] Patient Name: Kathleen Pope Procedure Date: 01/14/2021 MRN: [id number] Attending MD: Estill Cotta. Loletha Carrow , MD Date of Birth: May 09, 1933 CSN: [id number] Age: 87 Admit Type: Inpatient Procedure:                Flexible Sigmoidoscopy Indications:              Hematochezia, acute blood loss anemia.                           IR embolization of left sided diverticular bleeding                            2 days ago. recurrent large volume hematochezia                            overnight and ongoing today Providers:                Mallie Mussel L. Loletha Carrow, MD, Erenest Rasher, RN, Ladona Ridgel, Technician Referring MD:             [facility] Medicines:                Monitored Anesthesia Care Complications:            No immediate complications. Estimated Blood Loss:     Estimated blood loss was minimal. Procedure:                Pre-Anesthesia Assessment:                           - Prior to the procedure, a History and Physical                            was performed, and patient medications and                            allergies were reviewed. The patient's tolerance of                            previous anesthesia was also reviewed. The risks                            and benefits of the procedure and the sedation                            options and risks were discussed with the patient.                            All questions were answered, and informed consent                            was obtained. Prior Anticoagulants: The patient has  taken no previous anticoagulant or antiplatelet                            agents. ASA Grade Assessment: IV - A patient with                            severe systemic disease that is a constant threat                            to life. After reviewing the risks and benefits,                            the patient was deemed in satisfactory condition to                             undergo the procedure.                           After obtaining informed consent, the scope was                            passed under direct vision. The PCF-H190DL                            ([id number] Olympus pediatric colonscope was                            introduced through the anus and advanced to the the                            descending colon. The flexible sigmoidoscopy was                            accomplished without difficulty. The patient                            tolerated the procedure fairly well. The quality of                            the bowel preparation was poor. Scope In: 4:02:46 PM Scope Out: 4:08:30 PM Total Procedure Duration: 0 hours 5 minutes 44 seconds  Findings:      The digital rectal exam findings include decreased sphincter tone.      Red blood was found in the rectum and to extent of insertion (20-25 cm       from anal verge).      A continuous area of bleeding (friable), severly ulcerated mucosa with       areas of necrosis with stigmata of recent bleeding was present in the       proximal rectum. Desc colon/rectal anastomosis could not be visualized       due to blood, edema and the findings encountered. Scope could not be       advanced further as lumen could not be further identified and the       colitis was severe. (Patient known to  have had a sigmoid resection with       colo-colonic anastomosis seen on colonoscopy earlier this admission. Impression:               - Preparation of the colon was poor.                           - Decreased sphincter tone found on digital rectal                            exam.                           - Blood in the rectum.                           - Mucosal ulceration.                           - No specimens collected.                           - Left-sided ischemic colitis. Moderate Sedation:      MAC sedation used Recommendation:           - Return patient to hospital ward for ongoing  care.                           - NPO.                           - Urgent general surgery consult. Discussed with                            Dr. Brantley Stage                           Ciprofloxacin and flagyl                           IV fluid support.                           Serial Hgb and Hct with transfusion as needed.                           (Daughter Kathleen Pope updated by phone and patient                            updated after recovery from sedation) Procedure Code(s):        --- Professional ---                           [phone], Sigmoidoscopy, flexible; diagnostic,                            including collection of specimen(s) by brushing or  washing, when performed (separate procedure) Diagnosis Code(s):        --- Professional ---                           K62.89, Other specified diseases of anus and rectum                           K62.5, Hemorrhage of anus and rectum                           K63.3, Ulcer of intestine                           K55.9, Vascular disorder of intestine, unspecified                           K92.1, Melena (includes Hematochezia) CPT copyright 2019 [employer]. All rights reserved. The codes documented in this report are preliminary and upon coder review may  be revised to meet current compliance requirements. Jaydalynn Olivero L. Loletha Carrow, MD 01/14/2021 4:55:56 PM This report has been signed electronically. Number of Addenda: 0

## 2021-01-14 NOTE — Progress Notes (Signed)
   01/13/21 2131  Assess: MEWS Score  Temp 100.2 F (37.9 C)  BP 94/64  Pulse Rate (!) 110  Resp 18  SpO2 94 %  O2 Device Room Air  Assess: MEWS Score  MEWS Temp 0  MEWS Systolic 1  MEWS Pulse 1  MEWS RR 0  MEWS LOC 0  MEWS Score 2  MEWS Score Color Yellow  Assess: if the MEWS score is Yellow or Red  Were vital signs taken at a resting state? Yes  Focused Assessment Change from prior assessment (see assessment flowsheet)  Early Detection of Sepsis Score *See Row Information* Low  MEWS guidelines implemented *See Row Information* Yes  Treat  MEWS Interventions Administered scheduled meds/treatments  Pain Scale 0-10  Pain Score 3  Pain Type Acute pain  Pain Location Head  Pain Orientation Right;Left  Pain Descriptors / Indicators Dull  Pain Frequency Intermittent  Pain Onset Gradual  Pain Intervention(s) Medication (See eMAR)  Multiple Pain Sites No  Breathing 0  Negative Vocalization 0  Body Language 0  Consolability 0  Take Vital Signs  Increase Vital Sign Frequency  Yellow: Q 2hr X 2 then Q 4hr X 2, if remains yellow, continue Q 4hrs  Escalate  MEWS: Escalate Red: discuss with charge nurse/RN and provider, consider discussing with RRT  Notify: Charge Nurse/RN  Name of Charge Nurse/RN Notified Tom  Date Charge Nurse/RN Notified 01/13/21  Time Charge Nurse/RN Notified 2230  Notify: Provider  Provider Name/Title Dr. Myna Hidalgo  Date Provider Notified 01/13/21  Notification Type Face-to-face  Notification Reason Change in status  Provider response At bedside  Date of Provider Response 01/13/21

## 2021-01-14 NOTE — Transfer of Care (Signed)
Immediate Anesthesia Transfer of Care Note  Patient: Kathleen Pope  Procedure(s) Performed: COLONOSCOPY WITH PROPOFOL (N/A ) ESOPHAGOGASTRODUODENOSCOPY (EGD) WITH PROPOFOL (N/A )  Patient Location: PACU  Anesthesia Type:MAC  Level of Consciousness: awake, alert  and oriented  Airway & Oxygen Therapy: Patient Spontanous Breathing and Patient connected to face mask oxygen  Post-op Assessment: Report given to RN and Post -op Vital signs reviewed and stable  Post vital signs: Reviewed and stable  Last Vitals:  Vitals Value Taken Time  BP 113/61 01/14/21 1620  Temp    Pulse 82 01/14/21 1620  Resp 17 01/14/21 1621  SpO2 100 % 01/14/21 1620  Vitals shown include unvalidated device data.  Last Pain:  Vitals:   01/14/21 1533  TempSrc: Oral  PainSc: 5       Patients Stated Pain Goal: 3 ([birth year] 6701)  Complications: No complications documented.

## 2021-01-14 NOTE — Progress Notes (Signed)
Patient was seen and examined in the preprocedure endoscopy area.  Hemodynamically stable.  Alert and conversational, denies chest pain or dyspnea.  She has the same dull lower abdominal discomfort since she has had after the IR intervention.  She passed black liquid stool with the last couple of hours.  I-STAT has been drawn and sent.  I discussed the case with nursing and anesthesia.  I reviewed everything with the patient, her frustration regarding his recurrent bleeding is understandable.  I have advised her to have an upper endoscopy and a full colonoscopy (if feasible without bowel preparation) to see if the bleeding site can be identified.  It is still most likely left-sided diverticular bleeding.  She understands that I may not be able to know that with certainty nor control that endoscopically even if I can identify that.  Nevertheless, we have to make an effort to do so so then I can speak with interventional radiology and surgery consultants afterward.  She was agreeable to the change of plan and for me to do an upper endoscopy and colonoscopy.   Wilfrid Lund, MD    [facility]

## 2021-01-14 NOTE — Progress Notes (Signed)
PROGRESS NOTE    Kathleen Pope  HGD:[id number] DOB: 09-Sep-1933 DOA: 01/11/2021 PCP: Irene Shipper, MD   Brief Narrative: Kathleen Pope is a 87 y.o. 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 Patient presented secondary recurrent GI bleeding.   Assessment & Plan:   Principal Problem:   Rectal bleed Active Problems:   Chronic lower GI bleeding   AVM (arteriovenous malformation) of colon   HTN (hypertension)   Acute blood loss anemia   GI bleed   Lower GI bleeding Recurrent diverticular bleed Patient presented with recurrent lower GI bleeding. Recently admitted with a similar presentation. On this admission, she is s/p mesenteric arteriogram with successful coil embolization of left colic artery on 9/62. Recurrent large volume episode of hematochezia overnight on 3/1. -GI recommendations: plan for flexible sigmoidoscopy today -Daily CBC  Acute blood loss anemia Secondary to above. Patient has received 3 units of PRBC to date.  Systolic murmur Significant. Likely aortic stenosis etiology. Radiation to abdomen with CT scan negative for AAA. Patient has outpatient Transthoracic Echocardiogram planned for this month per PCP recommendation. Outpatient follow-up.  Hyperglycemia No hemoglobin A1C available. Fasting blood sugar of 114 today which is below threshold of concern for diabetes mellitus. -Obtain hemoglobin A1C. May not be completely accurate in light of recent blood transfusions but fasting blood sugar suggests no concern for diabetes  COPD Patient is a current smoker. COPD stable.  Primary hypertension Patient is on losartan as an outpatient which was held secondary to hypotension from acute blood loss. -Continue to hold losartan  Collagenous colitis Patient is on budesonide as an outpatient. Currently takes varying doses but most recently was taking 12 mg daily. Currently on  prescribed dose of 6 mg daily -Continue budesonide 6 mg daily  Leukocytosis Possibly related to budesonide, although this is is a chronic medication. No overt evidence of infection -Trend CBC  Hyperlipidemia -Continue Pravastatin  Underweight Weight is currently stable. Down about 10 lbs  from several years ago. -Dietician consult   DVT prophylaxis: SCDs secondary to GI bleeding Code Status:   Code Status: DNR Family Communication: None at bedside Disposition Plan: Discharge home pending stability of hemoglobin and GI bleeding.   Consultants:   Gastroenterology Velora Heckler)  Procedures:   POST MESENTERIC ARTERIOGRAM AND PERCUTANEOUS EMBOLIZATION (01/12/2021)  Antimicrobials:  None    Subjective: Significant GI bleeding overnight with bright red blood per rectum. Hypotensive requiring blood transfusion and IV fluids.   Objective: Vitals:   01/13/21 2328 01/14/21 0131 01/14/21 0206 01/14/21 0530  BP: (!) 99/48 (!) 97/52 (!) 94/55 102/63  Pulse: 81 89 84 85  Resp: 18 15 16 17   Temp: 98.1 F (36.7 C) 98.8 F (37.1 C) 98.6 F (37 C) 98.3 F (36.8 C)  TempSrc: Oral Oral Oral Oral  SpO2: 97% 96% 97% 97%  Weight:      Height:        Intake/Output Summary (Last 24 hours) at 01/14/2021 0728 Last data filed at 01/14/2021 0600 Gross per 24 hour  Intake 1919.34 ml  Output --  Net 1919.34 ml   Filed Weights   01/11/21 1908  Weight: 39.9 kg    Examination:  General exam: Appears calm and comfortable Respiratory system: Clear to auscultation. Respiratory effort normal. Cardiovascular system: S1 & S2 heard, RRR. 3/6 crescendo-decrescendo murmur with radiation to abdomen Gastrointestinal system: Abdomen is nondistended, soft and nontender. No organomegaly or masses felt. Normal bowel sounds  heard. Central nervous system: Alert and oriented. No focal neurological deficits. Musculoskeletal: No edema. No calf tenderness Skin: No cyanosis. No rashes Psychiatry: Judgement  and insight appear normal. Mood & affect appropriate.     Data Reviewed: I have personally reviewed following labs and imaging studies  CBC Lab Results  Component Value Date   WBC 17.0 (H) 01/14/2021   RBC 2.81 (L) 01/14/2021   HGB 8.3 (L) 01/14/2021   HCT 25.3 (L) 01/14/2021   MCV 90.0 01/14/2021   MCH 29.5 01/14/2021   PLT 160 01/14/2021   MCHC 32.8 01/14/2021   RDW 17.9 (H) 01/14/2021   LYMPHSABS 1.4 01/14/2021   MONOABS 2.1 (H) 01/14/2021   EOSABS 0.1 01/14/2021   BASOSABS 0.0 53/97/6734     Last metabolic panel Lab Results  Component Value Date   NA 136 01/14/2021   K 4.1 01/14/2021   CL 106 01/14/2021   CO2 24 01/14/2021   BUN 16 01/14/2021   CREATININE 0.76 01/14/2021   GLUCOSE 114 (H) 01/14/2021   GFRNONAA >60 01/14/2021   GFRAA >90 03/10/2013   CALCIUM 7.3 (L) 01/14/2021   PHOS 4.4 01/14/2021   PROT 4.0 (L) 01/14/2021   ALBUMIN 2.1 (L) 01/14/2021   BILITOT 1.0 01/14/2021   ALKPHOS 38 01/14/2021   AST 15 01/14/2021   ALT 10 01/14/2021   ANIONGAP 6 01/14/2021    CBG (last 3)  No results for input(s): GLUCAP in the last 72 hours.   GFR: Estimated Creatinine Clearance: 32.4 mL/min (by C-G formula based on SCr of 0.76 mg/dL).  Coagulation Profile: Recent Labs  Lab 01/08/21 1140  INR 0.9    Recent Results (from the past 240 hour(s))  Resp Panel by RT-PCR (Flu A&B, Covid) Nasopharyngeal Swab     Status: None   Collection Time: 01/08/21  2:51 PM   Specimen: Nasopharyngeal Swab; Nasopharyngeal(NP) swabs in vial transport medium  Result Value Ref Range Status   SARS Coronavirus 2 by RT PCR NEGATIVE NEGATIVE Final    Comment: (NOTE) SARS-CoV-2 target nucleic acids are NOT DETECTED.  The SARS-CoV-2 RNA is generally detectable in upper respiratory specimens during the acute phase of infection. The lowest concentration of SARS-CoV-2 viral copies this assay can detect is 138 copies/mL. A negative result does not preclude SARS-Cov-2 infection and  should not be used as the sole basis for treatment or other patient management decisions. A negative result may occur with  improper specimen collection/handling, submission of specimen other than nasopharyngeal swab, presence of viral mutation(s) within the areas targeted by this assay, and inadequate number of viral copies(<138 copies/mL). A negative result must be combined with clinical observations, patient history, and epidemiological information. The expected result is Negative.  Fact Sheet for Patients:  EntrepreneurPulse.com.au  Fact Sheet for Healthcare Providers:  IncredibleEmployment.be  This test is no t yet approved or cleared by the [place] FDA and  has been authorized for detection and/or diagnosis of SARS-CoV-2 by FDA under an Emergency Use Authorization (EUA). This EUA will remain  in effect (meaning this test can be used) for the duration of the COVID-19 declaration under Section 564(b)(1) of the Act, 21 U.S.C.section 360bbb-3(b)(1), unless the authorization is terminated  or revoked sooner.       Influenza A by PCR NEGATIVE NEGATIVE Final   Influenza B by PCR NEGATIVE NEGATIVE Final    Comment: (NOTE) The Xpert Xpress SARS-CoV-2/FLU/RSV plus assay is intended as an aid in the diagnosis of influenza from Nasopharyngeal swab specimens and should  not be used as a sole basis for treatment. Nasal washings and aspirates are unacceptable for Xpert Xpress SARS-CoV-2/FLU/RSV testing.  Fact Sheet for Patients: EntrepreneurPulse.com.au  Fact Sheet for Healthcare Providers: IncredibleEmployment.be  This test is not yet approved or cleared by the [place] FDA and has been authorized for detection and/or diagnosis of SARS-CoV-2 by FDA under an Emergency Use Authorization (EUA). This EUA will remain in effect (meaning this test can be used) for the duration of the COVID-19 declaration  under Section 564(b)(1) of the Act, 21 U.S.C. section 360bbb-3(b)(1), unless the authorization is terminated or revoked.  Performed at [facility], [place] [address], [place], [place] [address]   Resp Panel by RT-PCR (Flu A&B, Covid) Nasopharyngeal Swab     Status: None   Collection Time: 01/11/21  7:53 PM   Specimen: Nasopharyngeal Swab; Nasopharyngeal(NP) swabs in vial transport medium  Result Value Ref Range Status   SARS Coronavirus 2 by RT PCR NEGATIVE NEGATIVE Final    Comment: (NOTE) SARS-CoV-2 target nucleic acids are NOT DETECTED.  The SARS-CoV-2 RNA is generally detectable in upper respiratory specimens during the acute phase of infection. The lowest concentration of SARS-CoV-2 viral copies this assay can detect is 138 copies/mL. A negative result does not preclude SARS-Cov-2 infection and should not be used as the sole basis for treatment or other patient management decisions. A negative result may occur with  improper specimen collection/handling, submission of specimen other than nasopharyngeal swab, presence of viral mutation(s) within the areas targeted by this assay, and inadequate number of viral copies(<138 copies/mL). A negative result must be combined with clinical observations, patient history, and epidemiological information. The expected result is Negative.  Fact Sheet for Patients:  EntrepreneurPulse.com.au  Fact Sheet for Healthcare Providers:  IncredibleEmployment.be  This test is no t yet approved or cleared by the [place] FDA and  has been authorized for detection and/or diagnosis of SARS-CoV-2 by FDA under an Emergency Use Authorization (EUA). This EUA will remain  in effect (meaning this test can be used) for the duration of the COVID-19 declaration under Section 564(b)(1) of the Act, 21 U.S.C.section 360bbb-3(b)(1), unless the authorization is terminated  or revoked sooner.        Influenza A by PCR NEGATIVE NEGATIVE Final   Influenza B by PCR NEGATIVE NEGATIVE Final    Comment: (NOTE) The Xpert Xpress SARS-CoV-2/FLU/RSV plus assay is intended as an aid in the diagnosis of influenza from Nasopharyngeal swab specimens and should not be used as a sole basis for treatment. Nasal washings and aspirates are unacceptable for Xpert Xpress SARS-CoV-2/FLU/RSV testing.  Fact Sheet for Patients: EntrepreneurPulse.com.au  Fact Sheet for Healthcare Providers: IncredibleEmployment.be  This test is not yet approved or cleared by the [place] FDA and has been authorized for detection and/or diagnosis of SARS-CoV-2 by FDA under an Emergency Use Authorization (EUA). This EUA will remain in effect (meaning this test can be used) for the duration of the COVID-19 declaration under Section 564(b)(1) of the Act, 21 U.S.C. section 360bbb-3(b)(1), unless the authorization is terminated or revoked.  Performed at [facility], [place] [address] [place], [place] [address]   MRSA PCR Screening     Status: None   Collection Time: 01/12/21  1:47 AM   Specimen: Nasopharyngeal  Result Value Ref Range Status   MRSA by PCR NEGATIVE NEGATIVE Final    Comment:        The GeneXpert MRSA Assay (FDA approved for NASAL specimens only),  is one component of a comprehensive MRSA colonization surveillance program. It is not intended to diagnose MRSA infection nor to guide or monitor treatment for MRSA infections. Performed at [facility], [place] [address] [place], [place] [address]         Radiology Studies: No results found.      Scheduled Meds: . sodium chloride   Intravenous Once  . budesonide  6 mg Oral Daily  . calcium carbonate  1,250 mg Oral Daily  . Chlorhexidine Gluconate Cloth  6 each Topical Daily  . ferrous sulfate  325 mg Oral BID  . pantoprazole (PROTONIX) IV  40 mg Intravenous Q12H  .  pravastatin  10 mg Oral Daily   Continuous Infusions:   LOS: 3 days     Cordelia Poche, MD [facility] 01/14/2021, 7:28 AM  If 7PM-7AM, please contact night-coverage [email or web address]

## 2021-01-14 NOTE — H&P (View-Only) (Signed)
Reason for Consult: Ischemic colitis Referring Physician: Pincus Sanes MD  Kathleen Pope is an 87 y.o. female.  HPI: Pleasant 87 year old female who is admitted last week due to GI bleeding.  Over the weekend CT angio revealed a source in the descending colon.  She underwent embolization and the bleeding stopped.  She overnight developed more rectal bleeding again.  Flexible sigmoidoscopy showed ischemic necrosis of the descending colon from 15 cm to about 25 cm.  This was also associated with some bleeding.  The scope could not be advanced.  Of note she had a colonoscopy on 01/10/2021 with Dr. Henrene Pastor showed an AVM in her cecum that was ablated.  She also had a few polyps removed as well.  One was from the cecum which was a tubular adenoma and the other was in the ascending colon was a sessile serrated polyp neither had dysplasia or malignancy.  She has a history of COPD and heavy tobacco use.  Currently, she denies any abdominal pain nausea or vomiting.  She feels well currently.  Past Medical History:  Diagnosis Date  . Anemia    pt denies, yet takes iron  . Chronic airway obstruction, not elsewhere classified   . COPD (chronic obstructive pulmonary disease) ([place])   . Diverticulosis   . GI hemorrhage from post-treatment cecal ulcer 08/18/2012  . Hemorrhoids   . HTN (hypertension)   . Hypercholesteremia   . Lower GI bleed multiple   AVM's    Past Surgical History:  Procedure Laterality Date  . BUNIONECTOMY     left  . COLECTOMY    . COLONOSCOPY  08/10/2012   Procedure: COLONOSCOPY;  Surgeon: Ladene Artist, MD,FACG;  Location: [facility];  Service: Endoscopy;  Laterality: N/A;  . COLONOSCOPY  08/19/2012   Procedure: COLONOSCOPY;  Surgeon: Gatha Mayer, MD;  Location: [facility];  Service: Endoscopy;  Laterality: N/A;  . COLONOSCOPY N/A 03/11/2013   Procedure: COLONOSCOPY;  Surgeon: Irene Shipper, MD;  Location: [facility];  Service: Endoscopy;  Laterality: N/A;  . COLONOSCOPY WITH  PROPOFOL N/A 01/10/2021   Procedure: COLONOSCOPY WITH PROPOFOL;  Surgeon: Irene Shipper, MD;  Location: [facility];  Service: Endoscopy;  Laterality: N/A;  . HOT HEMOSTASIS N/A 01/10/2021   Procedure: HOT HEMOSTASIS (ARGON PLASMA COAGULATION/BICAP);  Surgeon: Irene Shipper, MD;  Location: [facility];  Service: Endoscopy;  Laterality: N/A;  . IR ANGIOGRAM SELECTIVE EACH ADDITIONAL VESSEL  01/12/2021  . IR ANGIOGRAM SELECTIVE EACH ADDITIONAL VESSEL  01/12/2021  . IR ANGIOGRAM SELECTIVE EACH ADDITIONAL VESSEL  01/12/2021  . IR ANGIOGRAM VISCERAL SELECTIVE  01/09/2021  . IR ANGIOGRAM VISCERAL SELECTIVE  01/12/2021  . IR EMBO ARTERIAL NOT HEMORR HEMANG INC GUIDE ROADMAPPING  01/12/2021  . IR US GUIDE VASC ACCESS RIGHT  01/09/2021  . IR US GUIDE VASC ACCESS RIGHT  01/12/2021  . POLYPECTOMY  01/10/2021   Procedure: POLYPECTOMY;  Surgeon: Irene Shipper, MD;  Location: [facility];  Service: Endoscopy;;  . TONSILLECTOMY      Family History  Problem Relation Age of Onset  . Breast cancer Mother   . Lung cancer Father   . Breast cancer Sister   . Anuerysm Brother        in stomach  . Colon cancer Neg Hx   . Esophageal cancer Neg Hx   . Rectal cancer Neg Hx     Social History:  reports that she has been smoking cigarettes. She has a 59.00 pack-year smoking history. She has never  used smokeless tobacco. She reports that she does not drink alcohol and does not use drugs.  Allergies: No Known Allergies  Medications: I have reviewed the patient's current medications.  Results for orders placed or performed during the hospital encounter of 01/11/21 (from the past 48 hour(s))  CBC     Status: Abnormal   Collection Time: 01/12/21  7:32 PM  Result Value Ref Range   WBC 16.0 (H) 4.0 - 10.5 K/uL   RBC 2.27 (L) 3.87 - 5.11 MIL/uL   Hemoglobin 7.3 (L) 12.0 - 15.0 g/dL   HCT 21.6 (L) 36.0 - 46.0 %   MCV 95.2 80.0 - 100.0 fL   MCH 32.2 26.0 - 34.0 pg   MCHC 33.8 30.0 - 36.0 g/dL   RDW 18.0 (H) 11.5 -  15.5 %   Platelets 191 150 - 400 K/uL   nRBC 0.0 0.0 - 0.2 %    Comment: Performed at [facility], [place] [address] [place], [place] [address]  CBC with Differential/Platelet     Status: Abnormal   Collection Time: 01/13/21  3:47 AM  Result Value Ref Range   WBC 18.2 (H) 4.0 - 10.5 K/uL   RBC 2.11 (L) 3.87 - 5.11 MIL/uL   Hemoglobin 7.0 (L) 12.0 - 15.0 g/dL    Comment: REPEATED TO VERIFY   HCT 20.2 (L) 36.0 - 46.0 %   MCV 95.7 80.0 - 100.0 fL   MCH 33.2 26.0 - 34.0 pg   MCHC 34.7 30.0 - 36.0 g/dL   RDW 18.0 (H) 11.5 - 15.5 %   Platelets 198 150 - 400 K/uL   nRBC 0.0 0.0 - 0.2 %   Neutrophils Relative % 80 %   Neutro Abs 14.7 (H) 1.7 - 7.7 K/uL   Lymphocytes Relative 8 %   Lymphs Abs 1.4 0.7 - 4.0 K/uL   Monocytes Relative 11 %   Monocytes Absolute 2.0 (H) 0.1 - 1.0 K/uL   Eosinophils Relative 0 %   Eosinophils Absolute 0.1 0.0 - 0.5 K/uL   Basophils Relative 0 %   Basophils Absolute 0.0 0.0 - 0.1 K/uL   Immature Granulocytes 1 %   Abs Immature Granulocytes 0.10 (H) 0.00 - 0.07 K/uL    Comment: Performed at [facility], [place] [address] [place], [place] [address]  Comprehensive metabolic panel     Status: Abnormal   Collection Time: 01/13/21  3:47 AM  Result Value Ref Range   Sodium 140 135 - 145 mmol/L   Potassium 4.6 3.5 - 5.1 mmol/L    Comment: DELTA CHECK NOTED   Chloride 107 98 - 111 mmol/L   CO2 25 22 - 32 mmol/L   Glucose, Bld 121 (H) 70 - 99 mg/dL    Comment: Glucose reference range applies only to samples taken after fasting for at least 8 hours.   BUN 12 8 - 23 mg/dL   Creatinine, Ser 0.74 0.44 - 1.00 mg/dL   Calcium 8.0 (L) 8.9 - 10.3 mg/dL   Total Protein 4.7 (L) 6.5 - 8.1 g/dL   Albumin 2.6 (L) 3.5 - 5.0 g/dL   AST 18 15 - 41 U/L   ALT 11 0 - 44 U/L   Alkaline Phosphatase 39 38 - 126 U/L   Total Bilirubin 0.8 0.3 - 1.2 mg/dL   GFR, Estimated >60 >60 mL/min    Comment: (NOTE) Calculated using the CKD-EPI Creatinine  Equation (2021)    Anion gap 8 5 - 15  Comment: Performed at [facility], [place] [address] [place], [place] [address]  Magnesium     Status: None   Collection Time: 01/13/21  3:47 AM  Result Value Ref Range   Magnesium 1.8 1.7 - 2.4 mg/dL    Comment: Performed at [facility], [place] [address], [place], [place] [address]  Phosphorus     Status: None   Collection Time: 01/13/21  3:47 AM  Result Value Ref Range   Phosphorus 2.6 2.5 - 4.6 mg/dL    Comment: Performed at [facility], [place] [address], [place], [place] [address]  Prepare RBC (crossmatch)     Status: None   Collection Time: 01/13/21  9:53 AM  Result Value Ref Range   Order Confirmation      ORDER PROCESSED BY BLOOD BANK Performed at [facility], [place] [address], [place], [place] [address]   Hemoglobin and hematocrit, blood     Status: Abnormal   Collection Time: 01/13/21  7:21 PM  Result Value Ref Range   Hemoglobin 8.2 (L) 12.0 - 15.0 g/dL   HCT 24.4 (L) 36.0 - 46.0 %    Comment: Performed at [facility], [place] [address] [place], [place] [address]  Prepare RBC (crossmatch)     Status: None   Collection Time: 01/13/21 10:00 PM  Result Value Ref Range   Order Confirmation      ORDER PROCESSED BY BLOOD BANK Performed at [facility], [place] [address], [place], [place] [address]   CBC with Differential/Platelet     Status: Abnormal   Collection Time: 01/14/21  5:19 AM  Result Value Ref Range   WBC 17.0 (H) 4.0 - 10.5 K/uL   RBC 2.81 (L) 3.87 - 5.11 MIL/uL   Hemoglobin 8.3 (L) 12.0 - 15.0 g/dL   HCT 25.3 (L) 36.0 - 46.0 %   MCV 90.0 80.0 - 100.0 fL   MCH 29.5 26.0 - 34.0 pg   MCHC 32.8 30.0 - 36.0 g/dL   RDW 17.9 (H) 11.5 - 15.5 %   Platelets 160 150 - 400 K/uL   nRBC 0.0 0.0 - 0.2 %   Neutrophils Relative % 78 %   Neutro Abs 13.3 (H) 1.7 - 7.7 K/uL   Lymphocytes Relative 8 %   Lymphs Abs 1.4 0.7 -  4.0 K/uL   Monocytes Relative 12 %   Monocytes Absolute 2.1 (H) 0.1 - 1.0 K/uL   Eosinophils Relative 1 %   Eosinophils Absolute 0.1 0.0 - 0.5 K/uL   Basophils Relative 0 %   Basophils Absolute 0.0 0.0 - 0.1 K/uL   Immature Granulocytes 1 %   Abs Immature Granulocytes 0.11 (H) 0.00 - 0.07 K/uL    Comment: Performed at [facility], [place] [address], [place],  [address]  Comprehensive metabolic panel     Status: Abnormal   Collection Time: 01/14/21  5:19 AM  Result Value Ref Range   Sodium 136 135 - 145 mmol/L   Potassium 4.1 3.5 - 5.1 mmol/L   Chloride 106 98 - 111 mmol/L   CO2 24 22 - 32 mmol/L   Glucose, Bld 114 (H) 70 - 99 mg/dL    Comment: Glucose reference range applies only to samples taken after fasting for at least 8 hours.   BUN 16 8 - 23 mg/dL   Creatinine, Ser 0.76 0.44 - 1.00 mg/dL   Calcium 7.3 (L) 8.9 - 10.3 mg/dL   Total  Protein 4.0 (L) 6.5 - 8.1 g/dL   Albumin 2.1 (L) 3.5 - 5.0 g/dL   AST 15 15 - 41 U/L   ALT 10 0 - 44 U/L   Alkaline Phosphatase 38 38 - 126 U/L   Total Bilirubin 1.0 0.3 - 1.2 mg/dL   GFR, Estimated >60 >60 mL/min    Comment: (NOTE) Calculated using the CKD-EPI Creatinine Equation (2021)    Anion gap 6 5 - 15    Comment: Performed at [facility], [place] [address], [place], [place] [address]  Magnesium     Status: None   Collection Time: 01/14/21  5:19 AM  Result Value Ref Range   Magnesium 1.8 1.7 - 2.4 mg/dL    Comment: Performed at [facility], [place] [address] [place], [place] [address]  Phosphorus     Status: None   Collection Time: 01/14/21  5:19 AM  Result Value Ref Range   Phosphorus 4.4 2.5 - 4.6 mg/dL    Comment: Performed at [facility], [place] [address] [place], [place] [address]  Hemoglobin and hematocrit, blood     Status: Abnormal   Collection Time: 01/14/21  9:05 AM  Result Value Ref Range   Hemoglobin 8.2 (L) 12.0 - 15.0 g/dL   HCT 24.6 (L)  36.0 - 46.0 %    Comment: Performed at [facility], [place] [address] [place], [place] [address]  I-STAT, [place] [address]     Status: Abnormal   Collection Time: 01/14/21  3:24 PM  Result Value Ref Range   Sodium 135 135 - 145 mmol/L   Potassium 3.9 3.5 - 5.1 mmol/L   Chloride 101 98 - 111 mmol/L   BUN 15 8 - 23 mg/dL   Creatinine, Ser 0.70 0.44 - 1.00 mg/dL   Glucose, Bld 103 (H) 70 - 99 mg/dL    Comment: Glucose reference range applies only to samples taken after fasting for at least 8 hours.   Calcium, Ion 1.03 (L) 1.15 - 1.40 mmol/L   TCO2 22 22 - 32 mmol/L   Hemoglobin 7.5 (L) 12.0 - 15.0 g/dL   HCT 22.0 (L) 36.0 - 46.0 %  Hemoglobin and hematocrit, blood     Status: Abnormal   Collection Time: 01/14/21  3:50 PM  Result Value Ref Range   Hemoglobin 7.7 (L) 12.0 - 15.0 g/dL   HCT 22.4 (L) 36.0 - 46.0 %    Comment: Performed at [facility], [place] [address] [place], [place] [address]    No results found.  Review of Systems  Constitutional: Negative.  Negative for activity change and appetite change.  HENT: Positive for dental problem. Negative for congestion.   Eyes: Negative.   Respiratory: Positive for cough. Negative for shortness of breath.   Cardiovascular: Negative for chest pain and leg swelling.  Gastrointestinal: Positive for anal bleeding and blood in stool. Negative for abdominal pain, constipation, diarrhea and nausea.  Endocrine: Negative for cold intolerance and heat intolerance.  Genitourinary: Negative for difficulty urinating and dyspareunia.  Musculoskeletal: Negative for arthralgias and back pain.  Neurological: Negative for dizziness and facial asymmetry.  Hematological: Negative for adenopathy.  Psychiatric/Behavioral: Negative for agitation and behavioral problems.   Blood pressure (!) 142/62, pulse 86, temperature 98.5 F (36.9 C), temperature source Oral, resp. rate 17, height 5' 1.5" (1.562 m), weight 39.9 kg, SpO2 100  %. Physical Exam Constitutional:      Appearance: Normal appearance.  HENT:  Head: Normocephalic.     Nose: Nose normal.     Mouth/Throat:     Mouth: Mucous membranes are dry.  Eyes:     Extraocular Movements: Extraocular movements intact.     Pupils: Pupils are equal, round, and reactive to light.  Cardiovascular:     Rate and Rhythm: Normal rate and regular rhythm.     Heart sounds: No murmur heard.   Pulmonary:     Effort: Pulmonary effort is normal.     Breath sounds: Normal breath sounds.  Abdominal:     General: Abdomen is flat. There is no distension.     Palpations: Abdomen is soft.     Tenderness: There is no abdominal tenderness. There is no guarding.     Comments: Previous midline scar noted  Musculoskeletal:        General: Normal range of motion.  Skin:    General: Skin is warm.  Neurological:     General: No focal deficit present.     Mental Status: She is alert and oriented to person, place, and time.  Psychiatric:        Mood and Affect: Mood normal.        Behavior: Behavior normal.     Assessment/Plan: Ischemic colitis-history of GI bleeding from descending colon status post angioembolization 2 days ago with sequela of ischemic colitis.  This is 15 to 25 cm from the anal verge according to gastroenterologist.  She is hemodynamically stable and has a benign examination.  I do recommend surgical excision of this with colostomy at this point time.  She is stable and the plan will be for tomorrow since the patient would like to have her daughter and other support system in place including a pastor.  She is stable now but I explained her if her condition were to worsen overnight she may require surgery sooner as well.  Complications were discussed at length.  She has a 56% of serious complication, a 31% risk of pneumonia, a 8% risk of cardiac complication, 49% risk of surgical site infection, 2% risk urinary tract infection, 4% risk of DVT, 3% risk of renal  failure, 30% risk of colonic ileus, readmission rate of 20%, risk of return to OR 10%, risk of death of being 13%, discharged to nursing facility only 70% of sepsis of about 8%.  She understands risks and she has no other option is willing to proceed.  I will discuss with family as well.  Jahyra Sukup A Leonidas Boateng 01/14/2021, 5:40 PM

## 2021-01-15 ENCOUNTER — Encounter (HOSPITAL_COMMUNITY): Admission: EM | Disposition: A | Discharge status: Home Health | Payer: Self-pay | Source: Home / Self Care

## 2021-01-15 ENCOUNTER — Inpatient Hospital Stay (HOSPITAL_COMMUNITY): Payer: Medicare Other

## 2021-01-15 ENCOUNTER — Inpatient Hospital Stay (HOSPITAL_COMMUNITY): Payer: Medicare Other | Admitting: Anesthesiology

## 2021-01-15 ENCOUNTER — Encounter (HOSPITAL_COMMUNITY): Payer: Self-pay | Admitting: Internal Medicine

## 2021-01-15 DIAGNOSIS — K922 Gastrointestinal hemorrhage, unspecified: Secondary | ICD-10-CM | POA: Diagnosis not present

## 2021-01-15 DIAGNOSIS — D62 Acute posthemorrhagic anemia: Secondary | ICD-10-CM | POA: Diagnosis not present

## 2021-01-15 DIAGNOSIS — K5520 Angiodysplasia of colon without hemorrhage: Secondary | ICD-10-CM | POA: Diagnosis not present

## 2021-01-15 DIAGNOSIS — R012 Other cardiac sounds: Secondary | ICD-10-CM

## 2021-01-15 DIAGNOSIS — I10 Essential (primary) hypertension: Secondary | ICD-10-CM | POA: Diagnosis not present

## 2021-01-15 HISTORY — PX: COLOSTOMY: SHX63

## 2021-01-15 HISTORY — PX: PARTIAL COLECTOMY: SHX5273

## 2021-01-15 LAB — TYPE AND SCREEN
ABO/RH(D): B POS
Antibody Screen: NEGATIVE
Unit division: 0
Unit division: 0
Unit division: 0
Unit division: 0
Unit division: 0

## 2021-01-15 LAB — ECHOCARDIOGRAM COMPLETE
AR max vel: 0.84 cm2
AV Area VTI: 0.93 cm2
AV Area mean vel: 0.86 cm2
AV Mean grad: 43.6 mmHg
AV Peak grad: 71.9 mmHg
Ao pk vel: 4.24 m/s
Area-P 1/2: 2.54 cm2
Height: 61.5 in
P 1/2 time: 373 msec
S' Lateral: 2.1 cm
Weight: 1407.42 oz

## 2021-01-15 LAB — BPAM RBC
Blood Product Expiration Date: 202203222359
Blood Product Expiration Date: 202203232359
Blood Product Expiration Date: 202203282359
Blood Product Expiration Date: 202203282359
Blood Product Expiration Date: 202203282359
ISSUE DATE / TIME: 202202280212
ISSUE DATE / TIME: 202203011208
ISSUE DATE / TIME: 202203012228
ISSUE DATE / TIME: 202203021606
Unit Type and Rh: 7300
Unit Type and Rh: 7300
Unit Type and Rh: 7300
Unit Type and Rh: 7300
Unit Type and Rh: 7300

## 2021-01-15 LAB — GLUCOSE, CAPILLARY
Glucose-Capillary: 159 mg/dL — ABNORMAL HIGH (ref 70–99)
Glucose-Capillary: 183 mg/dL — ABNORMAL HIGH (ref 70–99)

## 2021-01-15 LAB — POCT I-STAT 7, (LYTES, BLD GAS, ICA,H+H)
Acid-base deficit: 4 mmol/L — ABNORMAL HIGH (ref 0.0–2.0)
Bicarbonate: 22.8 mmol/L (ref 20.0–28.0)
Calcium, Ion: 1.09 mmol/L — ABNORMAL LOW (ref 1.15–1.40)
HCT: 27 % — ABNORMAL LOW (ref 36.0–46.0)
Hemoglobin: 9.2 g/dL — ABNORMAL LOW (ref 12.0–15.0)
O2 Saturation: 97 %
Potassium: 3.9 mmol/L (ref 3.5–5.1)
Sodium: 139 mmol/L (ref 135–145)
TCO2: 24 mmol/L (ref 22–32)
pCO2 arterial: 50.7 mmHg — ABNORMAL HIGH (ref 32.0–48.0)
pH, Arterial: 7.262 — ABNORMAL LOW (ref 7.350–7.450)
pO2, Arterial: 101 mmHg (ref 83.0–108.0)

## 2021-01-15 LAB — PREPARE RBC (CROSSMATCH)

## 2021-01-15 SURGERY — COLECTOMY, PARTIAL
Anesthesia: General

## 2021-01-15 MED ORDER — PANTOPRAZOLE SODIUM 40 MG IV SOLR
40.0000 mg | INTRAVENOUS | Status: DC
  Administered 2021-01-16 – 2021-01-24 (×9): 40 mg via INTRAVENOUS
  Filled 2021-01-15 (×9): qty 40

## 2021-01-15 MED ORDER — ONDANSETRON HCL 4 MG/2ML IJ SOLN
INTRAMUSCULAR | Status: DC | PRN
  Administered 2021-01-15: 4 mg via INTRAVENOUS

## 2021-01-15 MED ORDER — ALBUTEROL SULFATE (2.5 MG/3ML) 0.083% IN NEBU: 2.5000 mg | INHALATION_SOLUTION | Freq: Four times a day (QID) | RESPIRATORY_TRACT | Status: DC | PRN

## 2021-01-15 MED ORDER — ROCURONIUM BROMIDE 10 MG/ML (PF) SYRINGE
PREFILLED_SYRINGE | INTRAVENOUS | Status: AC
  Filled 2021-01-15: qty 10

## 2021-01-15 MED ORDER — ONDANSETRON HCL 4 MG/2ML IJ SOLN
INTRAMUSCULAR | Status: AC
  Filled 2021-01-15: qty 2

## 2021-01-15 MED ORDER — PHENYLEPHRINE 40 MCG/ML (10ML) SYRINGE FOR IV PUSH (FOR BLOOD PRESSURE SUPPORT)
PREFILLED_SYRINGE | INTRAVENOUS | Status: AC
  Filled 2021-01-15: qty 10

## 2021-01-15 MED ORDER — ONDANSETRON HCL 4 MG/2ML IJ SOLN: 4.0000 mg | Freq: Once | INTRAMUSCULAR | Status: AC | PRN

## 2021-01-15 MED ORDER — FENTANYL CITRATE (PF) 100 MCG/2ML IJ SOLN
INTRAMUSCULAR | Status: DC | PRN
  Administered 2021-01-15: 50 ug via INTRAVENOUS
  Administered 2021-01-15 (×4): 25 ug via INTRAVENOUS
  Administered 2021-01-15: 100 ug via INTRAVENOUS
  Administered 2021-01-15: 50 ug via INTRAVENOUS

## 2021-01-15 MED ORDER — LACTATED RINGERS IV SOLN: INTRAVENOUS | Status: DC | PRN

## 2021-01-15 MED ORDER — CHLORHEXIDINE GLUCONATE CLOTH 2 % EX PADS: 6.0000 | MEDICATED_PAD | Freq: Once | CUTANEOUS | Status: DC

## 2021-01-15 MED ORDER — PHENYLEPHRINE 40 MCG/ML (10ML) SYRINGE FOR IV PUSH (FOR BLOOD PRESSURE SUPPORT)
PREFILLED_SYRINGE | INTRAVENOUS | Status: DC | PRN
  Administered 2021-01-15 (×2): 80 ug via INTRAVENOUS

## 2021-01-15 MED ORDER — INSULIN ASPART 100 UNIT/ML ~~LOC~~ SOLN
0.0000 [IU] | Freq: Three times a day (TID) | SUBCUTANEOUS | Status: DC
  Administered 2021-01-15: 3 [IU] via SUBCUTANEOUS
  Administered 2021-01-16 (×2): 2 [IU] via SUBCUTANEOUS
  Administered 2021-01-16 – 2021-01-17 (×2): 3 [IU] via SUBCUTANEOUS
  Administered 2021-01-17 – 2021-01-18 (×2): 2 [IU] via SUBCUTANEOUS
  Administered 2021-01-18: 3 [IU] via SUBCUTANEOUS
  Administered 2021-01-19: 5 [IU] via SUBCUTANEOUS
  Administered 2021-01-19 – 2021-01-21 (×3): 2 [IU] via SUBCUTANEOUS
  Administered 2021-01-21: 3 [IU] via SUBCUTANEOUS
  Administered 2021-01-22: 2 [IU] via SUBCUTANEOUS
  Administered 2021-01-22: 3 [IU] via SUBCUTANEOUS
  Administered 2021-01-23 – 2021-01-24 (×3): 2 [IU] via SUBCUTANEOUS
  Administered 2021-01-24: 3 [IU] via SUBCUTANEOUS
  Administered 2021-01-25 – 2021-01-27 (×6): 2 [IU] via SUBCUTANEOUS
  Administered 2021-01-28 – 2021-01-29 (×2): 3 [IU] via SUBCUTANEOUS

## 2021-01-15 MED ORDER — SODIUM CHLORIDE 0.9 % IV SOLN
2.0000 g | INTRAVENOUS | Status: AC
  Administered 2021-01-15: 2 g via INTRAVENOUS
  Filled 2021-01-15: qty 2

## 2021-01-15 MED ORDER — FENTANYL CITRATE (PF) 100 MCG/2ML IJ SOLN
INTRAMUSCULAR | Status: AC
  Filled 2021-01-15: qty 2

## 2021-01-15 MED ORDER — LIDOCAINE HCL (PF) 2 % IJ SOLN
INTRAMUSCULAR | Status: AC
  Filled 2021-01-15: qty 5

## 2021-01-15 MED ORDER — FENTANYL CITRATE (PF) 100 MCG/2ML IJ SOLN
25.0000 ug | INTRAMUSCULAR | Status: DC | PRN
  Administered 2021-01-15: 25 ug via INTRAVENOUS

## 2021-01-15 MED ORDER — PROPOFOL 10 MG/ML IV BOLUS
INTRAVENOUS | Status: DC | PRN
  Administered 2021-01-15 (×2): 100 mg via INTRAVENOUS

## 2021-01-15 MED ORDER — ONDANSETRON HCL 4 MG/2ML IJ SOLN
INTRAMUSCULAR | Status: AC
  Administered 2021-01-15: 4 mg via INTRAVENOUS
  Filled 2021-01-15: qty 2

## 2021-01-15 MED ORDER — ACETAMINOPHEN 10 MG/ML IV SOLN: 650.0000 mg | Freq: Once | INTRAVENOUS | Status: DC | PRN

## 2021-01-15 MED ORDER — ALVIMOPAN 12 MG PO CAPS
12.0000 mg | ORAL_CAPSULE | ORAL | Status: AC
  Administered 2021-01-15: 12 mg via ORAL
  Filled 2021-01-15: qty 1

## 2021-01-15 MED ORDER — LACTATED RINGERS IV SOLN: INTRAVENOUS | Status: DC

## 2021-01-15 MED ORDER — CIPROFLOXACIN IN D5W 400 MG/200ML IV SOLN
400.0000 mg | Freq: Two times a day (BID) | INTRAVENOUS | Status: DC
  Administered 2021-01-15 – 2021-01-19 (×8): 400 mg via INTRAVENOUS
  Filled 2021-01-15 (×8): qty 200

## 2021-01-15 MED ORDER — 0.9 % SODIUM CHLORIDE (POUR BTL) OPTIME
TOPICAL | Status: DC | PRN
  Administered 2021-01-15 (×2): 1000 mL

## 2021-01-15 MED ORDER — PHENYLEPHRINE HCL-NACL 20-0.9 MG/250ML-% IV SOLN
INTRAVENOUS | Status: DC | PRN
  Administered 2021-01-15: 15 ug/min via INTRAVENOUS

## 2021-01-15 MED ORDER — HYDRALAZINE HCL 20 MG/ML IJ SOLN
INTRAMUSCULAR | Status: AC
  Filled 2021-01-15: qty 1

## 2021-01-15 MED ORDER — HYDROMORPHONE HCL 1 MG/ML IJ SOLN
INTRAMUSCULAR | Status: AC
  Filled 2021-01-15: qty 1

## 2021-01-15 MED ORDER — SODIUM CHLORIDE 0.9 % IV SOLN: INTRAVENOUS | Status: DC | PRN

## 2021-01-15 MED ORDER — MORPHINE SULFATE (PF) 2 MG/ML IV SOLN
2.0000 mg | INTRAVENOUS | Status: DC | PRN
  Administered 2021-01-15 – 2021-01-23 (×20): 2 mg via INTRAVENOUS
  Filled 2021-01-15 (×20): qty 1

## 2021-01-15 MED ORDER — ROCURONIUM BROMIDE 10 MG/ML (PF) SYRINGE
PREFILLED_SYRINGE | INTRAVENOUS | Status: DC | PRN
  Administered 2021-01-15: 40 mg via INTRAVENOUS
  Administered 2021-01-15: 60 mg via INTRAVENOUS

## 2021-01-15 MED ORDER — DEXAMETHASONE SODIUM PHOSPHATE 10 MG/ML IJ SOLN
INTRAMUSCULAR | Status: DC | PRN
  Administered 2021-01-15: 8 mg via INTRAVENOUS

## 2021-01-15 MED ORDER — KCL IN DEXTROSE-NACL 20-5-0.45 MEQ/L-%-% IV SOLN
INTRAVENOUS | Status: DC
  Filled 2021-01-15 (×5): qty 1000

## 2021-01-15 MED ORDER — FENTANYL CITRATE (PF) 100 MCG/2ML IJ SOLN
INTRAMUSCULAR | Status: AC
  Administered 2021-01-15: 25 ug via INTRAVENOUS
  Filled 2021-01-15: qty 2

## 2021-01-15 MED ORDER — OXYCODONE HCL 5 MG PO TABS
5.0000 mg | ORAL_TABLET | Freq: Four times a day (QID) | ORAL | Status: DC | PRN
  Administered 2021-01-16 – 2021-01-27 (×13): 5 mg via ORAL
  Filled 2021-01-15 (×13): qty 1

## 2021-01-15 MED ORDER — FENTANYL CITRATE (PF) 250 MCG/5ML IJ SOLN
INTRAMUSCULAR | Status: AC
  Filled 2021-01-15: qty 5

## 2021-01-15 MED ORDER — SODIUM CHLORIDE 0.9 % IV SOLN
INTRAVENOUS | Status: DC | PRN
  Administered 2021-01-15: 500 mL via INTRAVENOUS

## 2021-01-15 MED ORDER — PROPOFOL 10 MG/ML IV BOLUS
INTRAVENOUS | Status: AC
  Filled 2021-01-15: qty 20

## 2021-01-15 MED ORDER — ALBUTEROL SULFATE (2.5 MG/3ML) 0.083% IN NEBU
INHALATION_SOLUTION | RESPIRATORY_TRACT | Status: AC
  Administered 2021-01-15: 2.5 mg via RESPIRATORY_TRACT
  Filled 2021-01-15: qty 3

## 2021-01-15 MED ORDER — METRONIDAZOLE IN NACL 5-0.79 MG/ML-% IV SOLN
500.0000 mg | Freq: Three times a day (TID) | INTRAVENOUS | Status: DC
  Administered 2021-01-15 – 2021-01-19 (×11): 500 mg via INTRAVENOUS
  Filled 2021-01-15 (×11): qty 100

## 2021-01-15 MED ORDER — SUGAMMADEX SODIUM 200 MG/2ML IV SOLN
INTRAVENOUS | Status: DC | PRN
  Administered 2021-01-15: 100 mg via INTRAVENOUS

## 2021-01-15 MED ORDER — LIDOCAINE 2% (20 MG/ML) 5 ML SYRINGE
INTRAMUSCULAR | Status: DC | PRN
  Administered 2021-01-15: 60 mg via INTRAVENOUS

## 2021-01-15 MED ORDER — DEXAMETHASONE SODIUM PHOSPHATE 10 MG/ML IJ SOLN
INTRAMUSCULAR | Status: AC
  Filled 2021-01-15: qty 1

## 2021-01-15 MED ORDER — HYDROMORPHONE HCL 1 MG/ML IJ SOLN
INTRAMUSCULAR | Status: DC | PRN
  Administered 2021-01-15 (×2): .25 mg via INTRAVENOUS

## 2021-01-15 MED ORDER — HYDRALAZINE HCL 20 MG/ML IJ SOLN
INTRAMUSCULAR | Status: DC | PRN
  Administered 2021-01-15 (×2): 5 mg via INTRAVENOUS

## 2021-01-15 MED ORDER — SODIUM CHLORIDE 0.9% IV SOLUTION: Freq: Once | INTRAVENOUS | Status: DC

## 2021-01-15 MED ORDER — PHENYLEPHRINE HCL (PRESSORS) 10 MG/ML IV SOLN
INTRAVENOUS | Status: AC
  Filled 2021-01-15: qty 2

## 2021-01-15 SURGICAL SUPPLY — 60 items
APL SWBSTK 6 STRL LF DISP (MISCELLANEOUS) ×2
APPLICATOR COTTON TIP 6 STRL (MISCELLANEOUS) ×2 IMPLANT
APPLICATOR COTTON TIP 6IN STRL (MISCELLANEOUS) ×4 IMPLANT
BLADE EXTENDED COATED 6.5IN (ELECTRODE) IMPLANT
BLADE HEX COATED 2.75 (ELECTRODE) ×2 IMPLANT
BLADE SURG SZ10 CARB STEEL (BLADE) ×2 IMPLANT
CLIP VESOCCLUDE LG 6/CT (CLIP) IMPLANT
COVER MAYO STAND STRL (DRAPES) ×2 IMPLANT
COVER SURGICAL LIGHT HANDLE (MISCELLANEOUS) ×2 IMPLANT
COVER WAND RF STERILE (DRAPES) IMPLANT
DRAPE LAPAROSCOPIC ABDOMINAL (DRAPES) ×2 IMPLANT
DRAPE SHEET LG 3/4 BI-LAMINATE (DRAPES) IMPLANT
DRAPE WARM FLUID 44X44 (DRAPES) ×2 IMPLANT
DRSG OPSITE POSTOP 4X10 (GAUZE/BANDAGES/DRESSINGS) ×1 IMPLANT
DRSG PAD ABDOMINAL 8X10 ST (GAUZE/BANDAGES/DRESSINGS) IMPLANT
ELECT REM PT RETURN 15FT ADLT (MISCELLANEOUS) ×2 IMPLANT
GAUZE SPONGE 4X4 12PLY STRL (GAUZE/BANDAGES/DRESSINGS) ×2 IMPLANT
GLOVE INDICATOR 8.0 STRL GRN (GLOVE) ×4 IMPLANT
GLOVE SS BIOGEL STRL SZ 7.5 (GLOVE) ×2 IMPLANT
GLOVE SUPERSENSE BIOGEL SZ 7.5 (GLOVE) ×2
GLOVE SURG UNDER POLY LF SZ7 (GLOVE) ×2 IMPLANT
GOWN STRL REUS W/TWL LRG LVL3 (GOWN DISPOSABLE) ×2 IMPLANT
GOWN STRL REUS W/TWL XL LVL3 (GOWN DISPOSABLE) ×4 IMPLANT
HANDLE SUCTION POOLE (INSTRUMENTS) ×1 IMPLANT
KIT BASIN OR (CUSTOM PROCEDURE TRAY) ×2 IMPLANT
KIT TURNOVER KIT A (KITS) ×2 IMPLANT
LEGGING LITHOTOMY PAIR STRL (DRAPES) IMPLANT
NS IRRIG 1000ML POUR BTL (IV SOLUTION) ×4 IMPLANT
PACK GENERAL/GYN (CUSTOM PROCEDURE TRAY) ×2 IMPLANT
PENCIL SMOKE EVACUATOR (MISCELLANEOUS) IMPLANT
RELOAD PROXIMATE 75MM BLUE (ENDOMECHANICALS) ×2 IMPLANT
RELOAD STAPLE 75 3.8 BLU REG (ENDOMECHANICALS) IMPLANT
SHEARS FOC LG CVD HARMONIC 17C (MISCELLANEOUS) IMPLANT
SHEARS HARMONIC ACE PLUS 36CM (ENDOMECHANICALS) IMPLANT
STAPLER PROXIMATE 75MM BLUE (STAPLE) ×1 IMPLANT
STAPLER VISISTAT 35W (STAPLE) ×2 IMPLANT
SUCTION POOLE HANDLE (INSTRUMENTS) ×2
SUT NOV 1 T60/GS (SUTURE) IMPLANT
SUT NOVA NAB DX-16 0-1 5-0 T12 (SUTURE) IMPLANT
SUT NOVA T20/GS 25 (SUTURE) IMPLANT
SUT PDS AB 1 CTX 36 (SUTURE) IMPLANT
SUT PDS AB 1 TP1 96 (SUTURE) ×4 IMPLANT
SUT PDS AB 3-0 SH 27 (SUTURE) IMPLANT
SUT PDS AB 4-0 SH 27 (SUTURE) IMPLANT
SUT PROLENE 2 0 BLUE (SUTURE) IMPLANT
SUT PROLENE 2 0 CT 1 (SUTURE) ×1 IMPLANT
SUT SILK 2 0 (SUTURE) ×2
SUT SILK 2 0 SH CR/8 (SUTURE) ×2 IMPLANT
SUT SILK 2 0SH CR/8 30 (SUTURE) IMPLANT
SUT SILK 2-0 18XBRD TIE 12 (SUTURE) ×1 IMPLANT
SUT SILK 2-0 30XBRD TIE 12 (SUTURE) IMPLANT
SUT SILK 3 0 (SUTURE) ×4
SUT SILK 3 0 SH CR/8 (SUTURE) ×2 IMPLANT
SUT SILK 3-0 18XBRD TIE 12 (SUTURE) ×2 IMPLANT
SUT VIC AB 2-0 SH 18 (SUTURE) ×2 IMPLANT
SUT VIC AB 3-0 SH 18 (SUTURE) ×2 IMPLANT
TOWEL OR 17X26 10 PK STRL BLUE (TOWEL DISPOSABLE) ×4 IMPLANT
TRAY FOLEY MTR SLVR 16FR STAT (SET/KITS/TRAYS/PACK) ×2 IMPLANT
WATER STERILE IRR 1000ML POUR (IV SOLUTION) IMPLANT
YANKAUER SUCT BULB TIP NO VENT (SUCTIONS) ×2 IMPLANT

## 2021-01-15 NOTE — Anesthesia Postprocedure Evaluation (Signed)
Anesthesia Post Note  Patient: Kathleen Pope  Procedure(s) Performed: PARTIAL COLECTOMY (N/A ) COLOSTOMY (N/A )     Patient location during evaluation: PACU Anesthesia Type: General Level of consciousness: awake and alert, oriented and patient cooperative Pain management: pain level controlled Vital Signs Assessment: post-procedure vital signs reviewed and stable Respiratory status: spontaneous breathing, nonlabored ventilation and respiratory function stable Cardiovascular status: blood pressure returned to baseline and stable Postop Assessment: no apparent nausea or vomiting Anesthetic complications: no   No complications documented.  Last Vitals:  Vitals:   01/15/21 1400 01/15/21 1415  BP: (!) 212/108 (!) 189/87  Pulse: (!) 106 (!) 111  Resp: 16 18  Temp:    SpO2: 99% 94%    Last Pain:  Vitals:   01/14/21 1945  TempSrc:   PainSc: 0-No pain                 Pervis Hocking

## 2021-01-15 NOTE — Progress Notes (Signed)
Initial Nutrition Assessment  DOCUMENTATION CODES:   Underweight  INTERVENTION:   Once diet advanced: -Ensure Surgery po BID, each supplement provides 330 kcal and 18 grams of protein -Multivitamin with minerals daily  NUTRITION DIAGNOSIS:   Increased nutrient needs related to post-op healing,acute illness (recurrent GI bleeding) as evidenced by estimated needs.  GOAL:   Patient will meet greater than or equal to 90% of their needs  MONITOR:   Diet advancement,Labs,Weight trends,I & O's  REASON FOR ASSESSMENT:   Consult Assessment of nutrition requirement/status  ASSESSMENT:   87 y.o. 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 Patient presented secondary recurrent GI bleeding.  3/2: s/p EGD, flex sig  Patient is currently in OR for surgical excision of ischemic colitis.  Per chart review, pt has been having recurrent lower GI bleeds. Was recently admitted for the same symptoms 2/24-2/26. Given symptoms, suspect PO has been decreased. Recommend protein supplements once diet advanced.  Per weight records, pt weighed 89 lbs in November 2021. Current weight: 87 lbs. Stable but pt is still underweight. Suspect some degree of malnutrition.  Medications: OSCAL, Ferrous sulfate, Lactated ringers, Zofran PRN  Labs reviewed.  NUTRITION - FOCUSED PHYSICAL EXAM:  Will attempt at follow-up - in OR  Diet Order:   Diet Order            Diet NPO time specified Except for: Ice Chips, Sips with Meds  Diet effective ____           Diet NPO time specified  Diet effective midnight                 EDUCATION NEEDS:   No education needs have been identified at this time  Skin:  Skin Assessment: Reviewed RN Assessment  Last BM:  3/2 -type 7  Height:   Ht Readings from Last 1 Encounters:  01/14/21 5' 1.5" (1.562 m)    Weight:   Wt Readings from Last 1 Encounters:  01/14/21 39.9  kg   BMI:  Body mass index is 16.35 kg/m.  Estimated Nutritional Needs:   Kcal:  1500-1700  Protein:  70-85g  Fluid:  1.7L/day  Clayton Bibles, MS, RD, LDN Inpatient Clinical Dietitian Contact information available via Amion

## 2021-01-15 NOTE — Progress Notes (Signed)
PROGRESS NOTE    [name]  VCB:[id number] DOB: [birth year] DOA: 01/11/2021 PCP: Redmond School, MD   Brief Narrative: Kathleen Pope is a 87 y.o. 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 Patient presented secondary recurrent GI bleeding.   Assessment & Plan:   Principal Problem:   Rectal bleed Active Problems:   Chronic lower GI bleeding   AVM (arteriovenous malformation) of colon   HTN (hypertension)   Acute blood loss anemia   GI bleed   Lower GI bleeding Recurrent diverticular bleed Patient presented with recurrent lower GI bleeding. Recently admitted with a similar presentation. On this admission, she is s/p mesenteric arteriogram with successful coil embolization of left colic artery on 3/84. Recurrent large volume episode of hematochezia overnight on 3/1. GI re-consulted and performed flexible sigmoidoscopy on 3/2 which was significant for colonic ischemia.  Colonic ischemia Seen on endoscopy. General surgery consulted and performed ex lap with partial colectomy and end colostomy.  Acute blood loss anemia Secondary to above. Patient has received 3 units of PRBC for GI bleeding in addition to another 2 units peri-op.  Severe aortic stenosis Seen on Transthoracic Echocardiogram. Will need outpatient cardiology follow-up.  Hyperglycemia Hemoglobin A1C of 7.4%. Would benefit from metformin on discharge if appropriate. -SSI  COPD Patient is a current smoker. COPD stable.  Primary hypertension Patient is on losartan as an outpatient which was held secondary to hypotension from acute blood loss. -Continue to hold losartan  Collagenous colitis Patient is on budesonide as an outpatient. Currently takes varying doses but most recently was taking 12 mg daily. Currently on prescribed dose of 6 mg daily -Continue budesonide 6 mg daily  Leukocytosis Possibly related to budesonide,  although this is is a chronic medication. No overt evidence of infection -Trend CBC  Hyperlipidemia -Continue Pravastatin  Underweight Weight is currently stable. Down about 10 lbs  from several years ago. -Dietician consult   DVT prophylaxis: SCDs secondary to GI bleeding Code Status:   Code Status: DNR Family Communication: None at bedside Disposition Plan: Discharge home pending general surgery recommendations   Consultants:   Gastroenterology Velora Heckler)  General surgery  Procedures:   POST MESENTERIC ARTERIOGRAM AND PERCUTANEOUS EMBOLIZATION (01/12/2021)  Antimicrobials:  None    Subjective: Patient is post op. No complaints at this time.  Objective: Vitals:   01/15/21 1500 01/15/21 1515 01/15/21 1530 01/15/21 1545  BP: 135/72 132/68 128/65 126/68  Pulse: (!) 113 (!) 117 (!) 116 (!) 115  Resp: 13 13 13 12   Temp: 97.6 F (36.4 C)     TempSrc:      SpO2: 94% 92% 93% 95%  Weight:      Height:        Intake/Output Summary (Last 24 hours) at 01/15/2021 1655 Last data filed at 01/15/2021 1400 Gross per 24 hour  Intake 2299.25 ml  Output 175 ml  Net 2124.25 ml   Filed Weights   01/11/21 1908 01/14/21 1533  Weight: 39.9 kg 39.9 kg    Examination:  General exam: Appears calm and comfortable Respiratory system: Clear to auscultation. Respiratory effort normal. Cardiovascular system: S1 & S2 heard, RRR. 2/6 systolic murmur Gastrointestinal system: Abdomen is nondistended, soft and tender. No organomegaly or masses felt. Normal bowel sounds heard. Central nervous system: Somnolent but arouses easily and is alert and oriented. No focal neurological deficits. Musculoskeletal: No edema. No calf tenderness Skin: No cyanosis. No rashes Psychiatry: Judgement and insight  appear normal. Mood & affect appropriate.     Data Reviewed: I have personally reviewed following labs and imaging studies  CBC Lab Results  Component Value Date   WBC 17.0 (H) 01/14/2021    RBC 2.81 (L) 01/14/2021   HGB 9.2 (L) 01/15/2021   HCT 27.0 (L) 01/15/2021   MCV 90.0 01/14/2021   MCH 29.5 01/14/2021   PLT 160 01/14/2021   MCHC 32.8 01/14/2021   RDW 17.9 (H) 01/14/2021   LYMPHSABS 1.4 01/14/2021   MONOABS 2.1 (H) 01/14/2021   EOSABS 0.1 01/14/2021   BASOSABS 0.0 16/08/9603     Last metabolic panel Lab Results  Component Value Date   NA 139 01/15/2021   K 3.9 01/15/2021   CL 101 01/14/2021   CO2 24 01/14/2021   BUN 15 01/14/2021   CREATININE 0.70 01/14/2021   GLUCOSE 103 (H) 01/14/2021   GFRNONAA >60 01/14/2021   GFRAA >90 03/10/2013   CALCIUM 7.3 (L) 01/14/2021   PHOS 4.4 01/14/2021   PROT 4.0 (L) 01/14/2021   ALBUMIN 2.1 (L) 01/14/2021   BILITOT 1.0 01/14/2021   ALKPHOS 38 01/14/2021   AST 15 01/14/2021   ALT 10 01/14/2021   ANIONGAP 6 01/14/2021    CBG (last 3)  No results for input(s): GLUCAP in the last 72 hours.   GFR: Estimated Creatinine Clearance: 32.4 mL/min (by C-G formula based on SCr of 0.7 mg/dL).  Coagulation Profile: No results for input(s): INR, PROTIME in the last 168 hours.  Recent Results (from the past 240 hour(s))  Resp Panel by RT-PCR (Flu A&B, Covid) Nasopharyngeal Swab     Status: None   Collection Time: 01/08/21  2:51 PM   Specimen: Nasopharyngeal Swab; Nasopharyngeal(NP) swabs in vial transport medium  Result Value Ref Range Status   SARS Coronavirus 2 by RT PCR NEGATIVE NEGATIVE Final    Comment: (NOTE) SARS-CoV-2 target nucleic acids are NOT DETECTED.  The SARS-CoV-2 RNA is generally detectable in upper respiratory specimens during the acute phase of infection. The lowest concentration of SARS-CoV-2 viral copies this assay can detect is 138 copies/mL. A negative result does not preclude SARS-Cov-2 infection and should not be used as the sole basis for treatment or other patient management decisions. A negative result may occur with  improper specimen collection/handling, submission of specimen other than  nasopharyngeal swab, presence of viral mutation(s) within the areas targeted by this assay, and inadequate number of viral copies(<138 copies/mL). A negative result must be combined with clinical observations, patient history, and epidemiological information. The expected result is Negative.  Fact Sheet for Patients:  EntrepreneurPulse.com.au  Fact Sheet for Healthcare Providers:  IncredibleEmployment.be  This test is no t yet approved or cleared by the [place] FDA and  has been authorized for detection and/or diagnosis of SARS-CoV-2 by FDA under an Emergency Use Authorization (EUA). This EUA will remain  in effect (meaning this test can be used) for the duration of the COVID-19 declaration under Section 564(b)(1) of the Act, 21 U.S.C.section 360bbb-3(b)(1), unless the authorization is terminated  or revoked sooner.       Influenza A by PCR NEGATIVE NEGATIVE Final   Influenza B by PCR NEGATIVE NEGATIVE Final    Comment: (NOTE) The Xpert Xpress SARS-CoV-2/FLU/RSV plus assay is intended as an aid in the diagnosis of influenza from Nasopharyngeal swab specimens and should not be used as a sole basis for treatment. Nasal washings and aspirates are unacceptable for Xpert Xpress SARS-CoV-2/FLU/RSV testing.  Fact Sheet for Patients: EntrepreneurPulse.com.au  Fact Sheet for Healthcare Providers: IncredibleEmployment.be  This test is not yet approved or cleared by the [place] FDA and has been authorized for detection and/or diagnosis of SARS-CoV-2 by FDA under an Emergency Use Authorization (EUA). This EUA will remain in effect (meaning this test can be used) for the duration of the COVID-19 declaration under Section 564(b)(1) of the Act, 21 U.S.C. section 360bbb-3(b)(1), unless the authorization is terminated or revoked.  Performed at [facility], [place] [address] [place], [place] [address]   Resp Panel by RT-PCR (Flu A&B, Covid) Nasopharyngeal Swab     Status: None   Collection Time: 01/11/21  7:53 PM   Specimen: Nasopharyngeal Swab; Nasopharyngeal(NP) swabs in vial transport medium  Result Value Ref Range Status   SARS Coronavirus 2 by RT PCR NEGATIVE NEGATIVE Final    Comment: (NOTE) SARS-CoV-2 target nucleic acids are NOT DETECTED.  The SARS-CoV-2 RNA is generally detectable in upper respiratory specimens during the acute phase of infection. The lowest concentration of SARS-CoV-2 viral copies this assay can detect is 138 copies/mL. A negative result does not preclude SARS-Cov-2 infection and should not be used as the sole basis for treatment or other patient management decisions. A negative result may occur with  improper specimen collection/handling, submission of specimen other than nasopharyngeal swab, presence of viral mutation(s) within the areas targeted by this assay, and inadequate number of viral copies(<138 copies/mL). A negative result must be combined with clinical observations, patient history, and epidemiological information. The expected result is Negative.  Fact Sheet for Patients:  EntrepreneurPulse.com.au  Fact Sheet for Healthcare Providers:  IncredibleEmployment.be  This test is no t yet approved or cleared by the [place] FDA and  has been authorized for detection and/or diagnosis of SARS-CoV-2 by FDA under an Emergency Use Authorization (EUA). This EUA will remain  in effect (meaning this test can be used) for the duration of the COVID-19 declaration under Section 564(b)(1) of the Act, 21 U.S.C.section 360bbb-3(b)(1), unless the authorization is terminated  or revoked sooner.       Influenza A by PCR NEGATIVE NEGATIVE Final   Influenza B by PCR NEGATIVE NEGATIVE Final    Comment: (NOTE) The Xpert Xpress SARS-CoV-2/FLU/RSV plus assay is intended as an aid in the  diagnosis of influenza from Nasopharyngeal swab specimens and should not be used as a sole basis for treatment. Nasal washings and aspirates are unacceptable for Xpert Xpress SARS-CoV-2/FLU/RSV testing.  Fact Sheet for Patients: EntrepreneurPulse.com.au  Fact Sheet for Healthcare Providers: IncredibleEmployment.be  This test is not yet approved or cleared by the [place] FDA and has been authorized for detection and/or diagnosis of SARS-CoV-2 by FDA under an Emergency Use Authorization (EUA). This EUA will remain in effect (meaning this test can be used) for the duration of the COVID-19 declaration under Section 564(b)(1) of the Act, 21 U.S.C. section 360bbb-3(b)(1), unless the authorization is terminated or revoked.  Performed at [facility], [place] [address] [place], [place] [address]   MRSA PCR Screening     Status: None   Collection Time: 01/12/21  1:47 AM   Specimen: Nasopharyngeal  Result Value Ref Range Status   MRSA by PCR NEGATIVE NEGATIVE Final    Comment:        The GeneXpert MRSA Assay (FDA approved for NASAL specimens only), is one component of a comprehensive MRSA colonization surveillance program. It is not intended to diagnose MRSA infection nor to guide or monitor treatment for MRSA infections.  Performed at [facility], [place] [address] [place], [place] [address]         Radiology Studies: ECHOCARDIOGRAM COMPLETE  Result Date: 01/15/2021    ECHOCARDIOGRAM REPORT   Patient Name:   SHAVONNE AMBROISE Date of Exam: 01/15/2021 Medical Rec #:  [id number]    Height:       61.5 in Accession #:    [id number]   Weight:       88.0 lb Date of Birth:  1933-10-09     BSA:          1.340 m Patient Age:    56 years     BP:           127/78 mmHg Patient Gender: F            HR:           83 bpm. Exam Location:  Inpatient Procedure: 2D Echo, Color Doppler, Cardiac Doppler and 3D Echo Indications:     Other Cardiac Sounds R01.2; Z01.818 Encounter for other                 preprocedural examination  History:        Patient has prior history of Echocardiogram examinations, most                 recent 08/07/2012. COPD; Risk Factors:Hypertension and                 Dyslipidemia.  Sonographer:    Jonelle Sidle Dance Referring Phys: [id number] ELIZABETH Stickney  1. There is severe aortic stenosis with AVA 0.84cm2, mean gradient 54 mmHg, peak gradient 30mmHg, Vmax 4.20m/s, DI 0.27.  2. The aortic valve is calcified. There is severe calcifcation of the aortic valve. There is severe thickening of the aortic valve. Aortic valve regurgitation is mild.  3. Left ventricular ejection fraction, by estimation, is 60 to 65%. The left ventricle has normal function. The left ventricle has no regional wall motion abnormalities.  4. There is severe hypertrophy of the basal septum measuring 1.8cm. The rest of the LV segments demonstrate moderate hypertrophy. An intracavitary gradient is present with no evidence of SAM.  5. Left ventricular diastolic parameters are consistent with Grade II diastolic dysfunction (pseudonormalization).  6. Elevated left atrial pressure.  7. Right ventricular systolic function is normal. The right ventricular size is normal.  8. Left atrial size was moderately dilated.  9. There is moderate thickening of the mitral valve leaflet(s). There is mild calcification of the mitral valve leaflet(s). Mild mitral annular calcification. Mild mitral valve regurgitation. 10. The inferior vena cava is dilated in size with >50% respiratory variability, suggesting right atrial pressure of 8 mmHg. Comparison(s): Compared to echo report in 2013, there is now severe aortic stenosis. FINDINGS  Left Ventricle: Left ventricular ejection fraction, by estimation, is 65 to 70%. The left ventricle has normal function. The left ventricle has no regional wall motion abnormalities. The left ventricular internal cavity size was  normal in size. There is  servere hypertrophy of the basal septal segments. The rest of the LV segments demonstrate moderate hypertrophy. Left ventricular diastolic parameters are consistent with Grade II diastolic dysfunction (pseudonormalization). Elevated left atrial pressure. The E/e' is 52. Right Ventricle: The right ventricular size is normal. No increase in right ventricular wall thickness. Right ventricular systolic function is normal. Left Atrium: Left atrial size was moderately dilated. Right Atrium: Right atrial size was normal in size. Pericardium: Trivial pericardial effusion is  present. Mitral Valve: The mitral valve is abnormal. There is moderate thickening of the mitral valve leaflet(s). There is mild calcification of the mitral valve leaflet(s). Mild mitral annular calcification. Mild mitral valve regurgitation. Tricuspid Valve: The tricuspid valve is normal in structure. Tricuspid valve regurgitation is trivial. Aortic Valve: There is severe aortic stenosis with AVA 0.84cm2, mean gradient 54 mmHg, peak gradient 81mmHg, Vmax 4.76m/s, DI 0.27. The aortic valve is calcified. There is severe calcifcation of the aortic valve. There is severe thickening of the aortic valve. Aortic valve regurgitation is mild. Aortic regurgitation PHT measures 373 msec. Pulmonic Valve: The pulmonic valve was normal in structure. Pulmonic valve regurgitation is trivial. Aorta: The aortic root and ascending aorta are structurally normal, with no evidence of dilitation. Venous: The inferior vena cava is dilated in size with greater than 50% respiratory variability, suggesting right atrial pressure of 8 mmHg. IAS/Shunts: No atrial level shunt detected by color flow Doppler.  LEFT VENTRICLE PLAX 2D LVIDd:         3.30 cm  Diastology LVIDs:         2.10 cm  LV e' medial:    5.66 cm/s LV PW:         1.30 cm  LV E/e' medial:  28.6 LV IVS:        1.20 cm  LV e' lateral:   4.35 cm/s LVOT diam:     2.00 cm  LV E/e' lateral: 37.2 LV  SV:         83 LV SV Index:   62 LVOT Area:     3.14 cm  RIGHT VENTRICLE             IVC RV Basal diam:  2.30 cm     IVC diam: 2.30 cm RV S prime:     14.10 cm/s TAPSE (M-mode): 1.6 cm LEFT ATRIUM             Index       RIGHT ATRIUM          Index LA diam:        3.70 cm 2.76 cm/m  RA Area:     9.22 cm LA Vol (A2C):   36.5 ml 27.23 ml/m RA Volume:   16.60 ml 12.39 ml/m LA Vol (A4C):   56.0 ml 41.78 ml/m LA Biplane Vol: 46.2 ml 34.47 ml/m  AORTIC VALVE AV Area (Vmax):    0.84 cm AV Area (Vmean):   0.86 cm AV Area (VTI):     0.93 cm AV Vmax:           424.00 cm/s AV Vmean:          307.200 cm/s AV VTI:            0.885 m AV Peak Grad:      71.9 mmHg AV Mean Grad:      43.6 mmHg LVOT Vmax:         114.00 cm/s LVOT Vmean:        84.300 cm/s LVOT VTI:          0.263 m LVOT/AV VTI ratio: 0.30 AI PHT:            373 msec  AORTA Ao Root diam: 3.20 cm Ao Asc diam:  3.10 cm MITRAL VALVE MV Area (PHT): 2.54 cm     SHUNTS MV Decel Time: 299 msec     Systemic VTI:  0.26 m MV E velocity: 162.00 cm/s  Systemic Diam: 2.00 cm  MV A velocity: 145.00 cm/s MV E/A ratio:  1.12 Gwyndolyn Kaufman MD Electronically signed by Gwyndolyn Kaufman MD Signature Date/Time: 01/15/2021/10:43:34 AM    Final         Scheduled Meds: . sodium chloride   Intravenous Once  . sodium chloride   Intravenous Once  . budesonide  6 mg Oral Daily  . calcium carbonate  1,250 mg Oral Daily  . Chlorhexidine Gluconate Cloth  6 each Topical Daily  . [START ON 01/16/2021] pantoprazole (PROTONIX) IV  40 mg Intravenous Q24H  . pravastatin  10 mg Oral Daily   Continuous Infusions: . ciprofloxacin    . metronidazole       LOS: 4 days     Cordelia Poche, MD [facility] 01/15/2021, 4:55 PM  If 7PM-7AM, please contact night-coverage [email or web address]

## 2021-01-15 NOTE — Anesthesia Procedure Notes (Signed)
Arterial Line Insertion Start/End3/01/2021 10:15 AM, 01/15/2021 10:30 AM Performed by: Murvin Natal, MD, anesthesiologist  Patient location: Pre-op. Preanesthetic checklist: patient identified, IV checked, site marked, risks and benefits discussed, surgical consent, monitors and equipment checked, pre-op evaluation, timeout performed and anesthesia consent Lidocaine 1% used for infiltration Left, radial was placed Catheter size: 20 Fr Hand hygiene performed , maximum sterile barriers used  and Seldinger technique used  Attempts: 2 Procedure performed using ultrasound guided technique. Ultrasound Notes:anatomy identified, needle tip was noted to be adjacent to the nerve/plexus identified and no ultrasound evidence of intravascular and/or intraneural injection Following insertion, dressing applied and Biopatch. Post procedure assessment: normal and unchanged  Patient tolerated the procedure well with no immediate complications.

## 2021-01-15 NOTE — Anesthesia Preprocedure Evaluation (Addendum)
Anesthesia Evaluation  Patient identified by MRN, date of birth, ID band Patient awake    Reviewed: Allergy & Precautions, NPO status , Patient's Chart, lab work & pertinent test results  Airway Mallampati: II  TM Distance: >3 FB Neck ROM: Full    Dental no notable dental hx.    Pulmonary COPD, Current Smoker and Patient abstained from smoking.,    Pulmonary exam normal breath sounds clear to auscultation       Cardiovascular hypertension, Pt. on medications + Valvular Problems/Murmurs AS  Rhythm:Regular Rate:Normal + Systolic murmurs ECG: ST, rate 102   Neuro/Psych negative neurological ROS  negative psych ROS   GI/Hepatic negative GI ROS, Neg liver ROS,   Endo/Other  negative endocrine ROS  Renal/GU negative Renal ROS     Musculoskeletal negative musculoskeletal ROS (+)   Abdominal   Peds  Hematology  (+) anemia ,   Anesthesia Other Findings ISCHEMIC COLITIS  Reproductive/Obstetrics                            Anesthesia Physical Anesthesia Plan  ASA: IV  Anesthesia Plan: General   Post-op Pain Management:    Induction: Intravenous  PONV Risk Score and Plan: 2 and Ondansetron, Dexamethasone and Treatment may vary due to age or medical condition  Airway Management Planned: Oral ETT  Additional Equipment: Arterial line  Intra-op Plan:   Post-operative Plan: Extubation in OR  Informed Consent: I have reviewed the patients History and Physical, chart, labs and discussed the procedure including the risks, benefits and alternatives for the proposed anesthesia with the patient or authorized representative who has indicated his/her understanding and acceptance.   Patient has DNR.  Discussed DNR with patient and Suspend DNR.   Dental advisory given  Plan Discussed with: CRNA  Anesthesia Plan Comments:         Anesthesia Quick Evaluation

## 2021-01-15 NOTE — Op Note (Signed)
Preoperative diagnosis: Colonic ischemia involving the distal descending colon and history of colonic GI bleeding  Postop diagnosis: Ischemic appearing mass at the splenic flexure and dense intra-abdominal adhesions  Procedure: Exploratory laparotomy with partial colectomy and end colostomy  Surgeon: Erroll Luna, MD  Assistant: Dr. Annie Main gross MD  Anesthesia: General  EBL: 60 cc  IV fluids: 2 L crystalloid and 2 units of packed RBCs  Drains: None  Specimen: Transverse colon, splenic flexure, descending colon down to rectum to pathology  Indications for procedure: The patient is an 87 year old female with a history of colonic bleeding.  She is undergoing colonoscopy with ablation of AV malformation about 2-1/2 weeks ago.  She also 2 polyps that were removed which were benign.  She returned last week due to GI bleeding.  Source was unable to be localized.  She underwent a CT angiogram which showed bleeding in the descending colon.  Embolization was performed.  2 days post procedure, she developed bleeding from the rectum again.  Flexible sigmoidoscopy was performed which showed significant severe ischemic colitis with necrosis noted by the gastroenterologist anywhere from about 15 to 25 cm a said.  They are unable to finish the colonoscopy due to the inability to visualize the colon.  She was evaluated.  We discussed colonic resection given the significant ischemia noted on colonoscopic exam.  We discussed medical management as well but given the amount of necrosis noted on the colonoscopy, I did not feel comfortable managing her medically long-term.  After lengthy discussion the pros and cons of surgery, her advanced age, complications and potential risks of procedure versus observation, she opted for colonic resection with colostomy after long discussion with her and her family.The procedure has been discussed with the patient.  Alternative therapies have been discussed with the patient.   Operative risks include bleeding,  Infection,  Organ injury,  Nerve injury,  Blood vessel injury,  DVT,  Pulmonary embolism,  Death,  And possible reoperation.  Medical management risks include worsening of present situation.  The success of the procedure is 50 -90 % at treating patients symptoms.  The patient understands and agrees to proceed.  Description of procedure: The patient was met in the holding area and questions were answered.  She was marked with a colostomy nurse preoperatively.  She was taken back to the operating.  She is placed supine upon the OR table.  Induction of general esthesia, Foley catheter was placed and the abdomen was prepped and draped in sterile fashion.  Timeout was performed.  She received appropriate preoperative antibiotics. Incision was used.  Dissection was carried in the midline.  We opened the lower midline down to the fascia and found old sutures from her previous laparotomy.  We then opened the midline and entered the abdominal cavity without difficulty.  Once the abdominal cavity is entered retractor was used.  She had filmy but still dense adhesions.  The omentum was taken off the anterior abdominal wall.  I was able to mobilize all omentum off the anterior abdominal wall.  The small bowel was dense adherent to itself into the descending colon.  We slowly were able to lyse adhesions to mobilize the small bowel off the left colon.  Of note she had a previous resection which appeared to be a partial distal colectomy from the suture line noted in the colon at about the distal descending colon.  We were able to mobilize the colon along the white line of Toldt and mobilized the left colon  all up to splenic flexure.  The splenic flexure was also mobilized away from the transverse colon.  Once I was able to do this I ran the colon from there all the way down into the pelvis.  The rectum was normal.  I saw no signs of ischemia involving the rectal stump.  Remainder the colon was  nonischemic except for a mass noted in the splenic flexure and areas of colonic ischemia in the splenic flexure.  This appeared ischemic.  The remainder the transverse colon was normal.  Hepatic flexure was normal.  In the ascending colon was normal.  The cecum showed signs of previous a VM cauterization by gastroenterology.  The bowel was thinned.  I went ahead and placed a suture in the cecum there to oversew where the AVM ablation was done a couple weeks ago since the colonic wall looked quite thin.  No masses were palpated or other abnormality noted in the cecum, ascending colon or transverse colon.  Given her CT angiogram showing bleeding from the ascending colon, I opted to do a partial colectomy just distal to the middle colic vessels all the way down to the rectum.  This was done using a GIA stapler.  The LigaSure was used to take the mesentery.  The ureter was identified on the left side and preserved.  We are not near the right ureter.  The rectum was transtransected at the proximal rectum.  It with a contour stapler.  The specimen was passed off the field and sent to pathology.  Irrigation was used hemostasis achieved.  The pelvis was reexamined.  A stitch was placed into the region was pulled up to get a better look cul-de-sac again there is no signs of any ischemic change whatsoever noted involving the rectum.  Irrigation was used and suctioned out.  The mesentery is found hemostatic.  Through the left upper quadrant a circular incision was made.  Transverse colon was delivered through that without difficulty for colostomy.  Small bowel was examined.  It was run from the Treitz to this ileocecal valve.  No evidence of bowel injury was noted.  Stomach was normal.  Nasogastric tube was in stomach.  Liver normal.  The spleen was normal.  She had atrophied uterus in the adnexa which are normal in the pelvis.  After irrigation was suctioned on hemostasis achieved, fascia closed #1 PDS.  Skin closed with  staples.  Colostomy matured with 3-0 Vicryl.  Appliance applied.  Dry dressings placed.  All counts were found to be correct.  The patient was awoke extubated taken to recovery in satisfactory condition.

## 2021-01-15 NOTE — Transfer of Care (Signed)
Immediate Anesthesia Transfer of Care Note  Patient: Kathleen Pope  Procedure(s) Performed: PARTIAL COLECTOMY (N/A ) COLOSTOMY (N/A )  Patient Location: PACU  Anesthesia Type:General  Level of Consciousness: awake, alert , oriented and patient cooperative  Airway & Oxygen Therapy: Patient Spontanous Breathing and Patient connected to face mask oxygen  Post-op Assessment: Report given to RN, Post -op Vital signs reviewed and stable and Patient moving all extremities  Post vital signs: Reviewed and stable  Last Vitals:  Vitals Value Taken Time  BP 216/104 01/15/21 1357  Temp    Pulse 106 01/15/21 1359  Resp 16 01/15/21 1359  SpO2 99 % 01/15/21 1359  Vitals shown include unvalidated device data.  Last Pain:  Vitals:   01/14/21 1945  TempSrc:   PainSc: 0-No pain      Patients Stated Pain Goal: 3 (79/02/40 9735)  Complications: No complications documented.

## 2021-01-15 NOTE — Progress Notes (Signed)
  Echocardiogram 2D Echocardiogram has been performed.  Randa Lynn Dance 01/15/2021, 9:23 AM

## 2021-01-15 NOTE — Interval H&P Note (Signed)
History and Physical Interval Note:  01/15/2021 10:27 AM  Kathleen Pope  has presented today for surgery, with the diagnosis of ISCHEMIC COLITIS.  The various methods of treatment have been discussed with the patient and family. After consideration of risks, benefits and other options for treatment, the patient has consented to  Procedure(s): PARTIAL COLECTOMY (N/A) COLOSTOMY (N/A) as a surgical intervention.  The patient's history has been reviewed, patient examined, no change in status, stable for surgery.  I have reviewed the patient's chart and labs.  Questions were answered to the patient's satisfaction.   Procedure discussed the patient.  Of note she has aortic stenosis therefore invasive monitoring will be utilized for her case.  Operative risk discussed.  Risk of bleeding, infection, ostomy, cardiovascular event, stroke, myocardial event, death, DVT, all outlined in my history and physical and discussed with the patient and daughter last night.  Unfortunate, no good medical option for treatment of this condition.  Turner Daniels MD

## 2021-01-15 NOTE — Anesthesia Procedure Notes (Signed)
Procedure Name: Intubation Date/Time: 01/15/2021 11:11 AM Performed by: Victoriano Lain, CRNA Pre-anesthesia Checklist: Patient identified, Emergency Drugs available, Suction available, Patient being monitored and Timeout performed Patient Re-evaluated:Patient Re-evaluated prior to induction Oxygen Delivery Method: Circle system utilized Preoxygenation: Pre-oxygenation with 100% oxygen Induction Type: IV induction Ventilation: Mask ventilation without difficulty Laryngoscope Size: Mac and 3 Grade View: Grade I Tube type: Oral Tube size: 7.0 mm Number of attempts: 1 Airway Equipment and Method: Stylet Placement Confirmation: ETT inserted through vocal cords under direct vision,  positive ETCO2 and breath sounds checked- equal and bilateral Secured at: 21 cm Tube secured with: Tape Dental Injury: Teeth and Oropharynx as per pre-operative assessment

## 2021-01-15 NOTE — Consult Note (Signed)
[facility] Nurse requested for preoperative stoma site marking  Discussed surgical procedure and stoma creation with patient.  Explained role of the [facility] nurse team.  Provided the patient with educational booklet and provided samples of pouching options.  Answered patient questions.  Patient lives alone but daughter is next door and she thinks she would aid in ostomy care as needed.  Patient is mobile and thinks she can manage alone.   Examined patient lying, sitting in order to place the marking in the patient's visual field, away from any creases or abdominal contour issues and within the rectus muscle. Patient has midline incision scar from previous surgery and deep creasing at umbilicus, the level she wears her pants at.  We will mark below this line as there is sufficient space for pouching.   Marked for colostomy in the LLQ  4 cm to the left of the umbilicus and 3 cm below the umbilicus.  Marked for ileostomy in the RLQ  4 cm to the right of the umbilicus and  3 cm below the umbilicus.  Patient's abdomen cleansed with CHG wipes at site markings, allowed to air dry prior to marking.Covered mark with thin film transparent dressing to preserve mark until date of surgery.   [facility] Nurse team will follow up with patient after surgery for continued ostomy care and teaching.    Domenic Moras MSN, RN, FNP-BC [facility] Wound, Ostomy, Continence Nurse Pager [phone]

## 2021-01-16 ENCOUNTER — Inpatient Hospital Stay (HOSPITAL_COMMUNITY): Payer: Medicare Other

## 2021-01-16 ENCOUNTER — Encounter (HOSPITAL_COMMUNITY): Admission: EM | Disposition: A | Discharge status: Home Health | Payer: Self-pay | Source: Home / Self Care

## 2021-01-16 ENCOUNTER — Encounter (HOSPITAL_COMMUNITY): Payer: Self-pay | Admitting: Surgery

## 2021-01-16 DIAGNOSIS — K921 Melena: Secondary | ICD-10-CM | POA: Diagnosis not present

## 2021-01-16 DIAGNOSIS — I10 Essential (primary) hypertension: Secondary | ICD-10-CM | POA: Diagnosis not present

## 2021-01-16 DIAGNOSIS — K5520 Angiodysplasia of colon without hemorrhage: Secondary | ICD-10-CM | POA: Diagnosis not present

## 2021-01-16 DIAGNOSIS — K922 Gastrointestinal hemorrhage, unspecified: Secondary | ICD-10-CM | POA: Diagnosis not present

## 2021-01-16 DIAGNOSIS — K625 Hemorrhage of anus and rectum: Secondary | ICD-10-CM

## 2021-01-16 DIAGNOSIS — D62 Acute posthemorrhagic anemia: Secondary | ICD-10-CM | POA: Diagnosis not present

## 2021-01-16 HISTORY — PX: COLONOSCOPY: SHX5424

## 2021-01-16 LAB — CBC
HCT: 29.7 % — ABNORMAL LOW (ref 36.0–46.0)
HCT: 21.9 % — ABNORMAL LOW (ref 36.0–46.0)
Hemoglobin: 9.9 g/dL — ABNORMAL LOW (ref 12.0–15.0)
Hemoglobin: 7.2 g/dL — ABNORMAL LOW (ref 12.0–15.0)
MCH: 28.6 pg (ref 26.0–34.0)
MCH: 29.4 pg (ref 26.0–34.0)
MCHC: 33.3 g/dL (ref 30.0–36.0)
MCHC: 32.9 g/dL (ref 30.0–36.0)
MCV: 85.8 fL (ref 80.0–100.0)
MCV: 89.4 fL (ref 80.0–100.0)
Platelets: 307 10*3/uL (ref 150–400)
Platelets: 274 10*3/uL (ref 150–400)
RBC: 3.46 MIL/uL — ABNORMAL LOW (ref 3.87–5.11)
RBC: 2.45 MIL/uL — ABNORMAL LOW (ref 3.87–5.11)
RDW: 17.7 % — ABNORMAL HIGH (ref 11.5–15.5)
RDW: 17.6 % — ABNORMAL HIGH (ref 11.5–15.5)
WBC: 13.1 10*3/uL — ABNORMAL HIGH (ref 4.0–10.5)
WBC: 11.8 10*3/uL — ABNORMAL HIGH (ref 4.0–10.5)
nRBC: 0 % (ref 0.0–0.2)
nRBC: 0 % (ref 0.0–0.2)

## 2021-01-16 LAB — GLUCOSE, CAPILLARY
Glucose-Capillary: 174 mg/dL — ABNORMAL HIGH (ref 70–99)
Glucose-Capillary: 125 mg/dL — ABNORMAL HIGH (ref 70–99)
Glucose-Capillary: 125 mg/dL — ABNORMAL HIGH (ref 70–99)
Glucose-Capillary: 136 mg/dL — ABNORMAL HIGH (ref 70–99)

## 2021-01-16 LAB — HEMOGLOBIN AND HEMATOCRIT, BLOOD
HCT: 24.8 % — ABNORMAL LOW (ref 36.0–46.0)
Hemoglobin: 8.4 g/dL — ABNORMAL LOW (ref 12.0–15.0)

## 2021-01-16 LAB — COMPREHENSIVE METABOLIC PANEL
ALT: 13 U/L (ref 0–44)
AST: 19 U/L (ref 15–41)
Albumin: 2 g/dL — ABNORMAL LOW (ref 3.5–5.0)
Alkaline Phosphatase: 39 U/L (ref 38–126)
Anion gap: 5 (ref 5–15)
BUN: 14 mg/dL (ref 8–23)
CO2: 25 mmol/L (ref 22–32)
Calcium: 7.2 mg/dL — ABNORMAL LOW (ref 8.9–10.3)
Chloride: 105 mmol/L (ref 98–111)
Creatinine, Ser: 0.64 mg/dL (ref 0.44–1.00)
GFR, Estimated: >60 mL/min (ref >60)
Glucose, Bld: 228 mg/dL — ABNORMAL HIGH (ref 70–99)
Potassium: 3.6 mmol/L (ref 3.5–5.1)
Sodium: 135 mmol/L (ref 135–145)
Total Bilirubin: 0.7 mg/dL (ref 0.3–1.2)
Total Protein: 4.6 g/dL — ABNORMAL LOW (ref 6.5–8.1)

## 2021-01-16 LAB — PREPARE RBC (CROSSMATCH)

## 2021-01-16 LAB — PROTIME-INR
INR: 1.4 — ABNORMAL HIGH (ref 0.8–1.2)
Prothrombin Time: 17.1 seconds — ABNORMAL HIGH (ref 11.4–15.2)

## 2021-01-16 SURGERY — COLONOSCOPY
Anesthesia: Monitor Anesthesia Care

## 2021-01-16 MED ORDER — LACTATED RINGERS IV BOLUS
1000.0000 mL | Freq: Once | INTRAVENOUS | Status: AC
  Administered 2021-01-16: 1000 mL via INTRAVENOUS

## 2021-01-16 MED ORDER — EPINEPHRINE 1 MG/10ML IJ SOSY
PREFILLED_SYRINGE | INTRAMUSCULAR | Status: AC
  Filled 2021-01-16: qty 10

## 2021-01-16 MED ORDER — FENTANYL CITRATE (PF) 100 MCG/2ML IJ SOLN
INTRAMUSCULAR | Status: AC
  Filled 2021-01-16: qty 2

## 2021-01-16 MED ORDER — MIDAZOLAM HCL (PF) 5 MG/ML IJ SOLN
INTRAMUSCULAR | Status: AC
  Filled 2021-01-16: qty 2

## 2021-01-16 MED ORDER — PHENYLEPHRINE HCL-NACL 10-0.9 MG/250ML-% IV SOLN
0.0000 ug/min | INTRAVENOUS | Status: DC
  Administered 2021-01-16: 20 ug/min via INTRAVENOUS
  Filled 2021-01-16 (×2): qty 250

## 2021-01-16 MED ORDER — LIP MEDEX EX OINT: TOPICAL_OINTMENT | CUTANEOUS | Status: DC | PRN

## 2021-01-16 MED ORDER — SODIUM CHLORIDE 0.9% IV SOLUTION: Freq: Once | INTRAVENOUS | Status: AC

## 2021-01-16 MED ORDER — DIPHENHYDRAMINE HCL 50 MG/ML IJ SOLN
INTRAMUSCULAR | Status: AC
  Filled 2021-01-16: qty 1

## 2021-01-16 MED ORDER — KETAMINE HCL-SODIUM CHLORIDE 100-0.9 MG/10ML-% IV SOSY
60.0000 mg | PREFILLED_SYRINGE | Freq: Once | INTRAVENOUS | Status: AC
  Administered 2021-01-16: 6.5 mg via INTRAVENOUS
  Filled 2021-01-16 (×2): qty 10

## 2021-01-16 MED ORDER — ACETAMINOPHEN 10 MG/ML IV SOLN
1000.0000 mg | Freq: Four times a day (QID) | INTRAVENOUS | Status: AC
  Administered 2021-01-16 – 2021-01-17 (×4): 1000 mg via INTRAVENOUS
  Filled 2021-01-16 (×4): qty 100

## 2021-01-16 MED ORDER — LIP MEDEX EX OINT
TOPICAL_OINTMENT | CUTANEOUS | Status: AC
  Filled 2021-01-16: qty 7

## 2021-01-16 MED ORDER — SODIUM CHLORIDE 0.9 % IV BOLUS
1000.0000 mL | Freq: Once | INTRAVENOUS | Status: AC
  Administered 2021-01-16: 1000 mL via INTRAVENOUS

## 2021-01-16 MED ORDER — METHOCARBAMOL 1000 MG/10ML IJ SOLN
500.0000 mg | Freq: Four times a day (QID) | INTRAVENOUS | Status: DC | PRN
  Administered 2021-01-18: 500 mg via INTRAVENOUS
  Filled 2021-01-16: qty 5
  Filled 2021-01-16: qty 500

## 2021-01-16 MED ORDER — SODIUM CHLORIDE 0.9 % IV BOLUS: 1000.0000 mL | Freq: Once | INTRAVENOUS | Status: DC

## 2021-01-16 MED ORDER — KETAMINE HCL-SODIUM CHLORIDE 100-0.9 MG/10ML-% IV SOSY
60.0000 mg | PREFILLED_SYRINGE | Freq: Once | INTRAVENOUS | Status: DC
  Filled 2021-01-16 (×2): qty 10

## 2021-01-16 NOTE — H&P (View-Only) (Signed)
I received a call at approximately 6:15 PM from Dr. Lonny Prude of the [facility] hospitalist service, and I arrived just before 7 PM to see Ms. Kathleen Pope.  I know her well from this hospitalization.  She was reportedly intermittently hypotensive this morning and received IV fluid boluses with improvement.  However, mid-to-late afternoon she became hypotensive again and passed bright red and maroon blood into her ostomy bag.  This was recorded to be about 250 cc.  It has continued to occur afterward up until now. Repeat CBC was drawn at 1547, and this evening was found to have a hemoglobin of 7.2, down from 9.9 early this morning.  2 units of PRBCs were ordered, none has yet been started. Code care consult was obtained, and I reviewed the note by Dr. Silas Flood.  It appears this patient was briefly stable at that time, and further supportive care was recommended.  However, during the time I have seen her over the last 35-40 minutes, she has had a systolic blood pressure of 85-95 most of the time, heart rate 100 210.  She is somnolent but conversational and oriented.  Abdomen is soft with some tenderness around the ostomy site.  She has maroon blood in the ostomy bag.  The ostomy tissue is markedly edematous but does not appear ischemic.   As before, she has a harsh systolic murmur ( severe AS recently discovered) and generally decreased breath sounds due to COPD.  In sum, this patient has brisk recurrent lower GI bleeding with hemodynamic compromise.  This is more bleeding than I would expect postoperatively, even with sick bowel that was recently ischemic.  Colonoscopy earlier this admission ablated a large cecal AVM and several benign-appearing polyps were also removed with cold snare polypectomy.  Unfortunately, any of those sites could be the source of current bleeding.  I just spoke with Dr. Ralene Ok, who is the surgeon on-call this evening for [facility].  I explained the entire case to him, he is equally  concerned and will be along to see the patient shortly.  He will do a thorough examination of the ostomy tissue and see if there is any identifiable bleeding source.  As Dr. Silas Flood had left by the time I arrived and was no longer on-call, I spoke with Dr. Ilda Mori with the critical care ground team.  Dr. Rhys Martini has just arrived to evaluate this patient, and I feel that central line access is necessary, and we need assistance with management of volume resuscitation.  Nursing is contacting the blood bank to get the first unit of PRBCs up here as quickly as possible.  (Patient currently has 2 20-gauge peripheral IV lines)  If there is no easily identifiable untreatable bleeding source at the time of Dr. Cheryln Manly ostomy evaluation, then I would most likely need to perform an urgent bedside unsedated colonoscopy through the ostomy to see if there is a proximal colonic bleeding source amenable to intervention. Dr. Rosendo Gros and I discussed that, and he feels it is unlikely that passing a colonoscope is likely to disrupt or otherwise damaged the fresh colostomy.  Regardless, a colonoscopy like that would be a high risk procedure done in an attempt to save this patient's life.  Adequate central access and volume administration would be essential first.  (We will also asked nursing to send a stat PT/INR out of concern for possible consumptive coagulopathy, as the last INR of 0.9 was on 01/08/2021)  I updated the patient, told her the current plan, and  I will call her daughter Kathleen Pope for an update.  Further information and plan to follow.  Kathleen Lund, MD [facility]

## 2021-01-16 NOTE — Progress Notes (Signed)
Went to patient's bedside to assess status in light of new hypotension and anemia. Patient feeling okay without any acute complaints.   At bedside, BP of 77/43 with HR of 93. Patient with slight pallor. On inspection of colostomy bag, frank blood was present. No ecchymosis present. Nursing emptied the colostomy bag yielding about 250 mL of fluid.   Discussed with Dr. Rosendo Gros, [facility]. I requested bedside evaluation. Recommendations from surgery that bleeding is likely intraluminal and therefor would recommend IR consult for possible repeat CTA/embolization; recommended to continue fluid resuscitation and blood transfusions.  Discussed with Dr. Silas Flood, PCCM. Patient with severe hypotension in setting of GI bleed with concern for development of shock. Received 1 L NS bolus with 1 L LR bolus running at the time; patient is 39.9 kg. Concern patient will develop to needing vasopressor support. PCCM will see as consult.  Discussed with Dr. Vernard Gambles, IR: recommendation for CTA once patient is stable vs discussion with GI for possible endoscopic procedure.  Discussed with Dr. Loletha Carrow, [facility]: Discussed possibility for bedside endoscopy in this patient. Concern, of course is patient's fresh colostomy, but will see patient at bedside and assess options.   Severe hypotension Patient given 1 L bolus with transient improvement and recurrent hypotension. Stat CBC returned with a hemoglobin of 7.2, down from 9.9 this morning with evidence of frank blood from colostomy. Repeat bolus ordered (LR fluids) and a second unit of blood ordered for stat transfusion. PCCM and GI agreed to see patient at bedside. Patient's daughter, [name], called and updated via telephone.  Cordelia Poche, MD [facility] 01/16/2021, 6:12 PM

## 2021-01-16 NOTE — Progress Notes (Signed)
I discussed the findings and plans with the pts daughter Kathleen Pope.  Pt currently receiving 1 of 2 u of PRBCs and appears stable per her last Hct.  Due to her aortic stenosis and response to transfusion I would be inclined to allow her to be further resuscitated over night and proceed to the OR in the AM.  I have spoken with Kathleen Pope and she agrees with this plan.   I've spoken with the pts RN and if patient acutely decompensates to n otify me and we would proceed emergently to the OR.

## 2021-01-16 NOTE — Progress Notes (Addendum)
1 Day Post-Op  Subjective: Some pain as expected, but otherwise ok.  Hates the NGT.    ROS: See above, otherwise other systems negative  Objective: Vital signs in last 24 hours: Temp:  [97.6 F (36.4 C)-98.9 F (37.2 C)] 98.3 F (36.8 C) (03/04 0300) Pulse Rate:  [83-118] 90 (03/04 0812) Resp:  [8-18] 15 (03/04 0812) BP: (121-214)/(54-108) 139/66 (03/04 0800) SpO2:  [90 %-99 %] 98 % (03/04 0812) Arterial Line BP: (110-227)/(47-92) 143/55 (03/04 0812) Last BM Date: 01/15/21  Intake/Output from previous day: 03/03 0701 - 03/04 0700 In: 3428.5 [I.V.:2614.8; Blood:315; IV Piggyback:498.7] Out: 1075 [Urine:1025; Blood:50] Intake/Output this shift: No intake/output data recorded.  PE: Heart: regular, + murmur Lungs: CTAB Abd: soft, appropriately tender, midline wound with minimal bloody drainage on the honeycomb dressing.  Colostomy looks good with viable stoma.  Some old dark blood noted at the os.  Otherwise no flatus or other contents in the pouch.  NGT with minimal output.  Hypoactive BS  Lab Results:  Recent Labs    01/14/21 0519 01/14/21 0905 01/15/21 1316 01/16/21 0539  WBC 17.0*  --   --  13.1*  HGB 8.3*   < > 9.2* 9.9*  HCT 25.3*   < > 27.0* 29.7*  PLT 160  --   --  307   < > = values in this interval not displayed.   BMET Recent Labs    01/14/21 0519 01/14/21 1524 01/15/21 1316 01/16/21 0539  NA 136 135 139 135  K 4.1 3.9 3.9 3.6  CL 106 101  --  105  CO2 24  --   --  25  GLUCOSE 114* 103*  --  228*  BUN 16 15  --  14  CREATININE 0.76 0.70  --  0.64  CALCIUM 7.3*  --   --  7.2*   PT/INR No results for input(s): LABPROT, INR in the last 72 hours. CMP     Component Value Date/Time   NA 135 01/16/2021 0539   K 3.6 01/16/2021 0539   CL 105 01/16/2021 0539   CO2 25 01/16/2021 0539   GLUCOSE 228 (H) 01/16/2021 0539   BUN 14 01/16/2021 0539   CREATININE 0.64 01/16/2021 0539   CALCIUM 7.2 (L) 01/16/2021 0539   PROT 4.6 (L) 01/16/2021 0539    ALBUMIN 2.0 (L) 01/16/2021 0539   AST 19 01/16/2021 0539   ALT 13 01/16/2021 0539   ALKPHOS 39 01/16/2021 0539   BILITOT 0.7 01/16/2021 0539   GFRNONAA >60 01/16/2021 0539   GFRAA >90 03/10/2013 0535   Lipase  No results found for: LIPASE     Studies/Results: ECHOCARDIOGRAM COMPLETE  Result Date: 01/15/2021    ECHOCARDIOGRAM REPORT   Patient Name:   Kathleen Pope Date of Exam: 01/15/2021 Medical Rec #:  [id number]    Height:       61.5 in Accession #:    [id number]   Weight:       88.0 lb Date of Birth:  [birth year]     BSA:          1.340 m Patient Age:    87 years     BP:           127/78 mmHg Patient Gender: F            HR:           83 bpm. Exam Location:  Inpatient Procedure: 2D Echo, Color Doppler, Cardiac Doppler and 3D Echo Indications:  Other Cardiac Sounds R01.2; Z01.818 Encounter for other                 preprocedural examination  History:        Patient has prior history of Echocardiogram examinations, most                 recent 08/07/2012. COPD; Risk Factors:Hypertension and                 Dyslipidemia.  Sonographer:    Jonelle Sidle Dance Referring Phys: [id number] [address] [facility]  1. There is severe aortic stenosis with AVA 0.84cm2, mean gradient 54 mmHg, peak gradient 87mmHg, Vmax 4.72m/s, DI 0.27.  2. The aortic valve is calcified. There is severe calcifcation of the aortic valve. There is severe thickening of the aortic valve. Aortic valve regurgitation is mild.  3. Left ventricular ejection fraction, by estimation, is 60 to 65%. The left ventricle has normal function. The left ventricle has no regional wall motion abnormalities.  4. There is severe hypertrophy of the basal septum measuring 1.8cm. The rest of the LV segments demonstrate moderate hypertrophy. An intracavitary gradient is present with no evidence of SAM.  5. Left ventricular diastolic parameters are consistent with Grade II diastolic dysfunction (pseudonormalization).  6. Elevated left atrial pressure.  7.  Right ventricular systolic function is normal. The right ventricular size is normal.  8. Left atrial size was moderately dilated.  9. There is moderate thickening of the mitral valve leaflet(s). There is mild calcification of the mitral valve leaflet(s). Mild mitral annular calcification. Mild mitral valve regurgitation. 10. The inferior vena cava is dilated in size with >50% respiratory variability, suggesting right atrial pressure of 8 mmHg. Comparison(s): Compared to echo report in 2013, there is now severe aortic stenosis. FINDINGS  Left Ventricle: Left ventricular ejection fraction, by estimation, is 65 to 70%. The left ventricle has normal function. The left ventricle has no regional wall motion abnormalities. The left ventricular internal cavity size was normal in size. There is  servere hypertrophy of the basal septal segments. The rest of the LV segments demonstrate moderate hypertrophy. Left ventricular diastolic parameters are consistent with Grade II diastolic dysfunction (pseudonormalization). Elevated left atrial pressure. The E/e' is 54. Right Ventricle: The right ventricular size is normal. No increase in right ventricular wall thickness. Right ventricular systolic function is normal. Left Atrium: Left atrial size was moderately dilated. Right Atrium: Right atrial size was normal in size. Pericardium: Trivial pericardial effusion is present. Mitral Valve: The mitral valve is abnormal. There is moderate thickening of the mitral valve leaflet(s). There is mild calcification of the mitral valve leaflet(s). Mild mitral annular calcification. Mild mitral valve regurgitation. Tricuspid Valve: The tricuspid valve is normal in structure. Tricuspid valve regurgitation is trivial. Aortic Valve: There is severe aortic stenosis with AVA 0.84cm2, mean gradient 54 mmHg, peak gradient 53mmHg, Vmax 4.56m/s, DI 0.27. The aortic valve is calcified. There is severe calcifcation of the aortic valve. There is severe  thickening of the aortic valve. Aortic valve regurgitation is mild. Aortic regurgitation PHT measures 373 msec. Pulmonic Valve: The pulmonic valve was normal in structure. Pulmonic valve regurgitation is trivial. Aorta: The aortic root and ascending aorta are structurally normal, with no evidence of dilitation. Venous: The inferior vena cava is dilated in size with greater than 50% respiratory variability, suggesting right atrial pressure of 8 mmHg. IAS/Shunts: No atrial level shunt detected by color flow Doppler.  LEFT VENTRICLE PLAX 2D LVIDd:  3.30 cm  Diastology LVIDs:         2.10 cm  LV e' medial:    5.66 cm/s LV PW:         1.30 cm  LV E/e' medial:  28.6 LV IVS:        1.20 cm  LV e' lateral:   4.35 cm/s LVOT diam:     2.00 cm  LV E/e' lateral: 37.2 LV SV:         83 LV SV Index:   62 LVOT Area:     3.14 cm  RIGHT VENTRICLE             IVC RV Basal diam:  2.30 cm     IVC diam: 2.30 cm RV S prime:     14.10 cm/s TAPSE (M-mode): 1.6 cm LEFT ATRIUM             Index       RIGHT ATRIUM          Index LA diam:        3.70 cm 2.76 cm/m  RA Area:     9.22 cm LA Vol (A2C):   36.5 ml 27.23 ml/m RA Volume:   16.60 ml 12.39 ml/m LA Vol (A4C):   56.0 ml 41.78 ml/m LA Biplane Vol: 46.2 ml 34.47 ml/m  AORTIC VALVE AV Area (Vmax):    0.84 cm AV Area (Vmean):   0.86 cm AV Area (VTI):     0.93 cm AV Vmax:           424.00 cm/s AV Vmean:          307.200 cm/s AV VTI:            0.885 m AV Peak Grad:      71.9 mmHg AV Mean Grad:      43.6 mmHg LVOT Vmax:         114.00 cm/s LVOT Vmean:        84.300 cm/s LVOT VTI:          0.263 m LVOT/AV VTI ratio: 0.30 AI PHT:            373 msec  AORTA Ao Root diam: 3.20 cm Ao Asc diam:  3.10 cm MITRAL VALVE MV Area (PHT): 2.54 cm     SHUNTS MV Decel Time: 299 msec     Systemic VTI:  0.26 m MV E velocity: 162.00 cm/s  Systemic Diam: 2.00 cm MV A velocity: 145.00 cm/s MV E/A ratio:  1.12 Gwyndolyn Kaufman MD Electronically signed by Gwyndolyn Kaufman MD Signature Date/Time:  01/15/2021/10:43:34 AM    Final     Anti-infectives: Anti-infectives (From admission, onward)   Start     Dose/Rate Route Frequency Ordered Stop   01/15/21 1800  ciprofloxacin (CIPRO) IVPB 400 mg        400 mg 200 mL/hr over 60 Minutes Intravenous Every 12 hours 01/15/21 1609     01/15/21 1700  metroNIDAZOLE (FLAGYL) IVPB 500 mg        500 mg 100 mL/hr over 60 Minutes Intravenous Every 8 hours 01/15/21 1609     01/15/21 0800  cefoTEtan (CEFOTAN) 2 g in sodium chloride 0.9 % 100 mL IVPB        2 g 200 mL/hr over 30 Minutes Intravenous On call to O.R. 01/15/21 0705 01/15/21 1145       Assessment/Plan COPD HTN  POD 1, s/p ex lap with partial colectomy (L) and end colostomy, Dr. Brantley Stage  01/15/21 -NGT to remain for now, but will discuss removal given minimal output -WOC consult for colostomy care -hgb stable -DC foley -may DC a line from our standpoint if not needed from medicine standpoint -OOB and mobilize, PT/OT eval -IS/pulm toilet -pain control, tylenol, prn robaxin, morphine prn -on cipro/flagyl (not sure how long this is needed, will d/w MD) -stable for floor from surgical standpoint as well  FEN - NPO/NGT/IVFs VTE - on hold due to bleeding issues ID - C/F   LOS: 5 days    Henreitta Cea , [facility] 01/16/2021, 8:43 AM Please see Amion for pager number during day hours 7:00am-4:30pm or 7:00am -11:30am on weekends

## 2021-01-16 NOTE — Progress Notes (Signed)
I received a call at approximately 6:15 PM from Dr. Lonny Prude of the [facility] hospitalist service, and I arrived just before 7 PM to see Ms. Cordaro.  I know her well from this hospitalization.  She was reportedly intermittently hypotensive this morning and received IV fluid boluses with improvement.  However, mid-to-late afternoon she became hypotensive again and passed bright red and maroon blood into her ostomy bag.  This was recorded to be about 250 cc.  It has continued to occur afterward up until now. Repeat CBC was drawn at 1547, and this evening was found to have a hemoglobin of 7.2, down from 9.9 early this morning.  2 units of PRBCs were ordered, none has yet been started. Code care consult was obtained, and I reviewed the note by Dr. Silas Flood.  It appears this patient was briefly stable at that time, and further supportive care was recommended.  However, during the time I have seen her over the last 35-40 minutes, she has had a systolic blood pressure of 85-95 most of the time, heart rate 100 210.  She is somnolent but conversational and oriented.  Abdomen is soft with some tenderness around the ostomy site.  She has maroon blood in the ostomy bag.  The ostomy tissue is markedly edematous but does not appear ischemic.   As before, she has a harsh systolic murmur ( severe AS recently discovered) and generally decreased breath sounds due to COPD.  In sum, this patient has brisk recurrent lower GI bleeding with hemodynamic compromise.  This is more bleeding than I would expect postoperatively, even with sick bowel that was recently ischemic.  Colonoscopy earlier this admission ablated a large cecal AVM and several benign-appearing polyps were also removed with cold snare polypectomy.  Unfortunately, any of those sites could be the source of current bleeding.  I just spoke with Dr. Ralene Ok, who is the surgeon on-call this evening for [facility].  I explained the entire case to him, he is equally  concerned and will be along to see the patient shortly.  He will do a thorough examination of the ostomy tissue and see if there is any identifiable bleeding source.  As Dr. Silas Flood had left by the time I arrived and was no longer on-call, I spoke with Dr. Ilda Mori with the critical care ground team.  Dr. Rhys Martini has just arrived to evaluate this patient, and I feel that central line access is necessary, and we need assistance with management of volume resuscitation.  Nursing is contacting the blood bank to get the first unit of PRBCs up here as quickly as possible.  (Patient currently has 2 20-gauge peripheral IV lines)  If there is no easily identifiable untreatable bleeding source at the time of Dr. Cheryln Manly ostomy evaluation, then I would most likely need to perform an urgent bedside unsedated colonoscopy through the ostomy to see if there is a proximal colonic bleeding source amenable to intervention. Dr. Rosendo Gros and I discussed that, and he feels it is unlikely that passing a colonoscope is likely to disrupt or otherwise damaged the fresh colostomy.  Regardless, a colonoscopy like that would be a high risk procedure done in an attempt to save this patient's life.  Adequate central access and volume administration would be essential first.  (We will also asked nursing to send a stat PT/INR out of concern for possible consumptive coagulopathy, as the last INR of 0.9 was on 01/08/2021)  I updated the patient, told her the current plan, and  I will call her daughter [name] for an update.  Further information and plan to follow.  Wilfrid Lund, MD [facility]

## 2021-01-16 NOTE — Interval H&P Note (Signed)
History and Physical Interval Note:  01/16/2021 9:25 PM  Kathleen Pope  has presented today for surgery, with the diagnosis of hematochezia.  The various methods of treatment have been discussed with the patient and family. After consideration of risks, benefits and other options for treatment, the patient has consented to  Procedure(s): COLONOSCOPY (N/A) as a surgical intervention.  The patient's history has been reviewed, patient examined, no change in status, stable for surgery.  I have reviewed the patient's chart and labs.  Questions were answered to the patient's satisfaction.     [name]

## 2021-01-16 NOTE — Op Note (Signed)
[facility] Patient Name: Kathleen Pope Procedure Date: 01/16/2021 MRN: [id number] Attending MD: Estill Cotta. Loletha Carrow , MD Date of Birth: 12/02/1932 CSN: [id number] Age: 87 Admit Type: Inpatient Procedure:                Colonoscopy Indications:              Hematochezia, Acute post hemorrhagic anemia Providers:                Mallie Mussel L. Loletha Carrow, MD, Baird Cancer, RN, Ladona Ridgel, Technician Referring MD:              Medicines:                Ketamine per PCCM service Complications:            No immediate complications. Estimated Blood Loss:     none related to procedure, no interventions taken.                            active GI bleeding as indication for procedure Procedure:                Pre-Anesthesia Assessment:                           - Prior to the procedure, a History and Physical                            was performed, and patient medications and                            allergies were reviewed. The patient's tolerance of                            previous anesthesia was also reviewed. The risks                            and benefits of the procedure and the sedation                            options and risks were discussed with the patient.                            All questions were answered, and informed consent                            was obtained. Prior Anticoagulants: The patient has                            taken no previous anticoagulant or antiplatelet                            agents. ASA Grade Assessment: IV - A patient with  severe systemic disease that is a constant threat                            to life. After reviewing the risks and benefits,                            the patient was deemed in satisfactory condition to                            undergo the procedure.                           After obtaining informed consent, the colonoscope                            was  passed under direct vision. Throughout the                            procedure, the patient's blood pressure, pulse, and                            oxygen saturations were monitored continuously. The                            GIF-H190 ([id number]) Olympus gastroscope was                            introduced through the descending colostomy and                            advanced to the the cecum, identified by                            appendiceal orifice and ileocecal valve. The                            PCF-H190DL ([id number]) Olympus pediatric colonscope                            was introduced through the and advanced to the. The                            colonoscopy was performed with difficulty due to                            post-surgical anatomy and excessive bleeding. The                            patient tolerated the procedure fairly well. The                            quality of the bowel preparation was poor                            (  unprepped, urgent procedure). The ileocecal valve                            was photographed. Scope In: 10:26:31 PM Scope Out: 10:37:56 PM Scope Withdrawal Time: 0 hours 3 minutes 16 seconds  Total Procedure Duration: 0 hours 11 minutes 25 seconds  Findings:      A very large amount of fresh blood and clots was found in the entire       colon. Adult EGD scope used first in right lateral decub position, then       peds colonoscope used in same position. Then patient turned to left       lateral decubitus and peds colonoscope used and cecum reached. Still       poor visualization, but ICV identified and transillumination in RLQ. Far       too much blood and clot to clear or identify bleeding source.      A non-bleeding polypectomy site was seen, probably in the transverse       colon      A single medium-sized angioectasia without bleeding or stigmata of       bleeding was found in the cecum or proximal ascending colon. Impression:                - Preparation of the colon was poor.                           - Blood in the entire examined colon. This appears                            to be most likely coming from a colon or proximal                            ascending colon source, based on the amount of                            blood and clots in that area. Possible source is                            recent cecal AVM ablation site, less likely cold                            snare polypectomy sites.                           - No specimens collected. Moderate Sedation:      see above Recommendation:           - NPO.                           - Remain in ICU                           Supportive care with IVF and PRBCs                           Urgent surgical re-evaluation Procedure Code(s):        ---  Professional ---                           [phone], Colonoscopy through stoma; diagnostic,                            including collection of specimen(s) by brushing or                            washing, when performed (separate procedure) Diagnosis Code(s):        --- Professional ---                           K92.2, Gastrointestinal hemorrhage, unspecified                           K92.1, Melena (includes Hematochezia)                           D62, Acute posthemorrhagic anemia CPT copyright 2019 American Medical Association. All rights reserved. The codes documented in this report are preliminary and upon coder review may  be revised to meet current compliance requirements. Anet Logsdon L. Loletha Carrow, MD 01/16/2021 11:27:14 PM This report has been signed electronically. Number of Addenda: 0

## 2021-01-16 NOTE — Progress Notes (Signed)
This RN emptied 250 cc of red/black stool from patient's ostomy. Dr. Lonny Prude assessed pt at bedside and was going to reach back out to Surgery. Dr. Lonny Prude ordered an additional unit of PRBCs. BP improving after second bolus. This RN will continue to carefully monitor pt.

## 2021-01-16 NOTE — Progress Notes (Addendum)
Called by RN as pt was hypotensive and noticed low Hct from 15:47 labs Pt POD 1 from ex lap and L colectomy for ischemic stricture.  There was a previous cecal AVM that was cauterized approx 1 week ago.  Pt with hypotension that appears to be responding intermittently to IVF boluses.  PRBC s ordered and pending.  Pt with some dark liquid blood appearing fluid approx 250cc at 1900. Approx 25-50cc in bag now.      Component Value Date/Time   WBC 11.8 (H) 01/16/2021 1547   RBC 2.45 (L) 01/16/2021 1547   HGB 7.2 (L) 01/16/2021 1547   HCT 21.9 (L) 01/16/2021 1547   PLT 274 01/16/2021 1547   MCV 89.4 01/16/2021 1547   MCH 29.4 01/16/2021 1547   MCHC 32.9 01/16/2021 1547   RDW 17.6 (H) 01/16/2021 1547   LYMPHSABS 1.4 01/14/2021 0519   MONOABS 2.1 (H) 01/14/2021 0519   EOSABS 0.1 01/14/2021 0519   BASOSABS 0.0 01/14/2021 0519   PE: Pt awake and responding appropriately Midline wound c/d/i Ostomy pink and patent with some dark bloody output, dark bloody old bloody deep with no signs of superficial blood loss.   A/P: -Plan to transfuse PRBCs tonight -Would trend hct.   S/w Dr. Loletha Carrow at the bedside and may benefit from repeat c-scope to eval avm site/bx sites. If surgery were need pt would require a completion colectomy and end ileostomy.

## 2021-01-16 NOTE — Progress Notes (Signed)
Per Dr. Lonny Prude, Gastroenterology physician will be assessing pt at bedside this evening, and has been updated by MD.

## 2021-01-16 NOTE — Op Note (Signed)
Central Venous Catheter Insertion Procedure Note  Kathleen Pope  [id number]  [birth year]  Date:01/16/21  Time:8:47 PM   Provider Performing:CHRISTOPHER R DOROTHY   Procedure: Insertion of Non-tunneled Central Venous Catheter(36556) without US guidance  Indication(s) Difficult access  Consent Unable to obtain consent due to emergent nature of procedure.  Anesthesia Topical only with 1% lidocaine   Timeout Verified patient identification, verified procedure, site/side was marked, verified correct patient position, special equipment/implants available, medications/allergies/relevant history reviewed, required imaging and test results available.  Sterile Technique Maximal sterile technique including full sterile barrier drape, hand hygiene, sterile gown, sterile gloves, mask, hair covering, sterile ultrasound probe cover (if used).  Procedure Description Area of catheter insertion was cleaned with chlorhexidine and draped in sterile fashion. A 8.5 introducer with a TLC place through the introducer was placed into the left subclavian vein using the Seldinger technique . Nonpulsatile blood flow and easy flushing noted in all ports.  The catheter was sutured in place and sterile dressing applied.  Complications/Tolerance None; patient tolerated the procedure well. Chest X-ray is ordered to verify placement   EBL Minimal  Specimen(s) None

## 2021-01-16 NOTE — Op Note (Signed)
Full procedure note to follow. Patient was sedated with ketamine administered by Dr. Earlie Server of the critical care service  Colonoscopy via colostomy advanced to right colon, and most likely the cecum based on limited visualization.  There was excellent transillumination the right lower quadrant and what appeared to be the ileocecal valve.  Other anatomic landmarks cannot be visualized due to the large amount of retained fresh blood and clots throughout the colon  The majority of fresh blood and clots appeared to be in the cecum and proximal ascending colon.  There is no way to be certain, but based on this appearance and what we know of previous colonoscopic findings, somewhere in this area is most likely the source of bleeding.  Unfortunately it was not feasible to clear this amount of material from the colon to visualize the area any better and attempt any endoscopic control.  My opinion is that this patient will need to return to the operating room to remove the remainder of her colon and thus stop this bleeding in an attempt to save her life. I spoke to Dr. Rosendo Gros of surgery to update him, he states he will reevaluate the patient and I also asked him to update her daughter afterward.  I spoke to her daughter [name] by phone and gave her an update.

## 2021-01-16 NOTE — Progress Notes (Signed)
Repeat CBC showed a drop in hgb since AM, from 9.9 to 7.2. Dr. Lonny Prude notified by this RN, and on-call surgeon notified. Orders received from [facility] hospitalist for 1 unit PRBCs. Awaiting further orders from surgeon at this time; RN will continue to carefully monitor pt.

## 2021-01-16 NOTE — Progress Notes (Signed)
Pt suddenly became hypotensive, asymptomatic upon assessment. SBP currently 88, was in low 70's. Dr. Lonny Prude paged by this RN. Orders received for STAT CBC and 1L NS bolus. This RN will continue to carefully monitor pt.

## 2021-01-16 NOTE — Consult Note (Addendum)
[place] Nurse ostomy consult note Stoma type/location: LUQ ileostomy Stomal assessment/size: 2" edematous pink and moist Peristomal assessment: pouch intact Treatment options for stomal/peristomal skin: will implement barrier ring Output blood tinged liquid in pouch.  NG tube in place.  Ostomy pouching: 2pc. 2 1/4" pouch will be implemented   Education provided: Patient states "I feel bad" .  NG tube discomfort and post surgical pain.  We will begin teaching MOnday once stoma is producing stool and patient is stronger and willing to learn. Written materials left at bedside with patient.  Enrolled patient in [place] program: No Will follow.  Domenic Moras MSN, RN, FNP-BC CWON Wound, Ostomy, Continence Nurse Pager [phone]

## 2021-01-16 NOTE — Progress Notes (Signed)
PROGRESS NOTE    LETTIE CZARNECKI  MPN:[id number] DOB: 03/27/1933 DOA: 01/11/2021 PCP: Redmond School, MD   Brief Narrative: Kathleen Pope is a 87 y.o. 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 Patient presented secondary recurrent GI bleeding.   Assessment & Plan:   Principal Problem:   Rectal bleed Active Problems:   Chronic lower GI bleeding   AVM (arteriovenous malformation) of colon   HTN (hypertension)   Acute blood loss anemia   GI bleed   Lower GI bleeding Recurrent diverticular bleed Patient presented with recurrent lower GI bleeding. Recently admitted with a similar presentation. On this admission, she is s/p mesenteric arteriogram with successful coil embolization of left colic artery on 0/08. Recurrent large volume episode of hematochezia overnight on 3/1. GI re-consulted and performed flexible sigmoidoscopy on 3/2 which was significant for colonic ischemia.  Colonic ischemia Seen on endoscopy. General surgery consulted and performed ex lap with partial colectomy and end colostomy on 3/3. NG tube placed post-op -General surgery recommendations: remove NG tube, Ciprofloxacin/Flagyl  Acute blood loss anemia Secondary to above. Patient has received 3 units of PRBC for GI bleeding in addition to another 2 units peri-op.  Severe aortic stenosis Seen on Transthoracic Echocardiogram. Will need outpatient cardiology follow-up.  Hyperglycemia Hemoglobin A1C of 7.4%. Would benefit from metformin on discharge if appropriate. -SSI  COPD Patient is a current smoker. COPD stable.  Primary hypertension Patient is on losartan as an outpatient which was held secondary to hypotension from acute blood loss. -Continue to hold losartan  Collagenous colitis Patient is on budesonide as an outpatient. Currently takes varying doses but most recently was taking 12 mg daily. Currently on prescribed  dose of 6 mg daily -Continue budesonide 6 mg daily  Leukocytosis Possibly related to budesonide, although this is is a chronic medication. No overt evidence of infection -Trend CBC  Hyperlipidemia -Continue Pravastatin  Underweight Weight is currently stable. Down about 10 lbs  from several years ago. -Dietician consult   DVT prophylaxis: SCDs secondary to GI bleeding Code Status:   Code Status: DNR Family Communication: None at bedside Disposition Plan: Discharge home pending general surgery recommendations   Consultants:   Gastroenterology Velora Heckler)  General surgery  Procedures:   POST MESENTERIC ARTERIOGRAM AND PERCUTANEOUS EMBOLIZATION (01/12/2021)  EGD/FLEXIBLE SIGMOIDOSCOPY (01/15/2021)  PARTIAL COLECTOMY (01/15/2021)  Antimicrobials:  Ciprofloxacin  Flagyl   Subjective: Abdominal pain. No other concerns.  Objective: Vitals:   01/16/21 1100 01/16/21 1148 01/16/21 1200 01/16/21 1300  BP: 131/61  (!) 151/72 128/66  Pulse: 94  93 91  Resp: 13  18 14   Temp:  98.8 F (37.1 C)    TempSrc:  Oral    SpO2: 96%  95% 97%  Weight:      Height:        Intake/Output Summary (Last 24 hours) at 01/16/2021 1404 Last data filed at 01/16/2021 1319 Gross per 24 hour  Intake 2347.72 ml  Output 1300 ml  Net 1047.72 ml   Filed Weights   01/11/21 1908 01/14/21 1533  Weight: 39.9 kg 39.9 kg    Examination:  General exam: Appears calm and comfortable. NG tube in place Respiratory system: Clear to auscultation. Respiratory effort normal. Cardiovascular system: S1 & S2 heard, RRR. No murmurs, rubs, gallops or clicks. Gastrointestinal system: Abdomen is nondistended, soft and tender. No organomegaly or masses felt. Normal bowel sounds heard. Central nervous system: Alert and oriented. No focal neurological  deficits. Musculoskeletal: No edema. No calf tenderness Skin: No cyanosis. No rashes Psychiatry: Judgement and insight appear normal. Mood & affect appropriate.      Data Reviewed: I have personally reviewed following labs and imaging studies  CBC Lab Results  Component Value Date   WBC 13.1 (H) 01/16/2021   RBC 3.46 (L) 01/16/2021   HGB 9.9 (L) 01/16/2021   HCT 29.7 (L) 01/16/2021   MCV 85.8 01/16/2021   MCH 28.6 01/16/2021   PLT 307 01/16/2021   MCHC 33.3 01/16/2021   RDW 17.7 (H) 01/16/2021   LYMPHSABS 1.4 01/14/2021   MONOABS 2.1 (H) 01/14/2021   EOSABS 0.1 01/14/2021   BASOSABS 0.0 09/38/1829     Last metabolic panel Lab Results  Component Value Date   NA 135 01/16/2021   K 3.6 01/16/2021   CL 105 01/16/2021   CO2 25 01/16/2021   BUN 14 01/16/2021   CREATININE 0.64 01/16/2021   GLUCOSE 228 (H) 01/16/2021   GFRNONAA >60 01/16/2021   GFRAA >90 03/10/2013   CALCIUM 7.2 (L) 01/16/2021   PHOS 4.4 01/14/2021   PROT 4.6 (L) 01/16/2021   ALBUMIN 2.0 (L) 01/16/2021   BILITOT 0.7 01/16/2021   ALKPHOS 39 01/16/2021   AST 19 01/16/2021   ALT 13 01/16/2021   ANIONGAP 5 01/16/2021    CBG (last 3)  Recent Labs    01/15/21 2159 01/16/21 0748 01/16/21 1114  GLUCAP 183* 174* 125*     GFR: Estimated Creatinine Clearance: 32.4 mL/min (by C-G formula based on SCr of 0.64 mg/dL).  Coagulation Profile: No results for input(s): INR, PROTIME in the last 168 hours.  Recent Results (from the past 240 hour(s))  Resp Panel by RT-PCR (Flu A&B, Covid) Nasopharyngeal Swab     Status: None   Collection Time: 01/08/21  2:51 PM   Specimen: Nasopharyngeal Swab; Nasopharyngeal(NP) swabs in vial transport medium  Result Value Ref Range Status   SARS Coronavirus 2 by RT PCR NEGATIVE NEGATIVE Final    Comment: (NOTE) SARS-CoV-2 target nucleic acids are NOT DETECTED.  The SARS-CoV-2 RNA is generally detectable in upper respiratory specimens during the acute phase of infection. The lowest concentration of SARS-CoV-2 viral copies this assay can detect is 138 copies/mL. A negative result does not preclude SARS-Cov-2 infection and should  not be used as the sole basis for treatment or other patient management decisions. A negative result may occur with  improper specimen collection/handling, submission of specimen other than nasopharyngeal swab, presence of viral mutation(s) within the areas targeted by this assay, and inadequate number of viral copies(<138 copies/mL). A negative result must be combined with clinical observations, patient history, and epidemiological information. The expected result is Negative.  Fact Sheet for Patients:  EntrepreneurPulse.com.au  Fact Sheet for Healthcare Providers:  IncredibleEmployment.be  This test is no t yet approved or cleared by the [place] FDA and  has been authorized for detection and/or diagnosis of SARS-CoV-2 by FDA under an Emergency Use Authorization (EUA). This EUA will remain  in effect (meaning this test can be used) for the duration of the COVID-19 declaration under Section 564(b)(1) of the Act, 21 U.S.C.section 360bbb-3(b)(1), unless the authorization is terminated  or revoked sooner.       Influenza A by PCR NEGATIVE NEGATIVE Final   Influenza B by PCR NEGATIVE NEGATIVE Final    Comment: (NOTE) The Xpert Xpress SARS-CoV-2/FLU/RSV plus assay is intended as an aid in the diagnosis of influenza from Nasopharyngeal swab specimens and should not be  used as a sole basis for treatment. Nasal washings and aspirates are unacceptable for Xpert Xpress SARS-CoV-2/FLU/RSV testing.  Fact Sheet for Patients: EntrepreneurPulse.com.au  Fact Sheet for Healthcare Providers: IncredibleEmployment.be  This test is not yet approved or cleared by the [place] FDA and has been authorized for detection and/or diagnosis of SARS-CoV-2 by FDA under an Emergency Use Authorization (EUA). This EUA will remain in effect (meaning this test can be used) for the duration of the COVID-19 declaration under  Section 564(b)(1) of the Act, 21 U.S.C. section 360bbb-3(b)(1), unless the authorization is terminated or revoked.  Performed at [facility], [place] [address] [place], [place] [address]   Resp Panel by RT-PCR (Flu A&B, Covid) Nasopharyngeal Swab     Status: None   Collection Time: 01/11/21  7:53 PM   Specimen: Nasopharyngeal Swab; Nasopharyngeal(NP) swabs in vial transport medium  Result Value Ref Range Status   SARS Coronavirus 2 by RT PCR NEGATIVE NEGATIVE Final    Comment: (NOTE) SARS-CoV-2 target nucleic acids are NOT DETECTED.  The SARS-CoV-2 RNA is generally detectable in upper respiratory specimens during the acute phase of infection. The lowest concentration of SARS-CoV-2 viral copies this assay can detect is 138 copies/mL. A negative result does not preclude SARS-Cov-2 infection and should not be used as the sole basis for treatment or other patient management decisions. A negative result may occur with  improper specimen collection/handling, submission of specimen other than nasopharyngeal swab, presence of viral mutation(s) within the areas targeted by this assay, and inadequate number of viral copies(<138 copies/mL). A negative result must be combined with clinical observations, patient history, and epidemiological information. The expected result is Negative.  Fact Sheet for Patients:  EntrepreneurPulse.com.au  Fact Sheet for Healthcare Providers:  IncredibleEmployment.be  This test is no t yet approved or cleared by the [place] FDA and  has been authorized for detection and/or diagnosis of SARS-CoV-2 by FDA under an Emergency Use Authorization (EUA). This EUA will remain  in effect (meaning this test can be used) for the duration of the COVID-19 declaration under Section 564(b)(1) of the Act, 21 U.S.C.section 360bbb-3(b)(1), unless the authorization is terminated  or revoked sooner.       Influenza A  by PCR NEGATIVE NEGATIVE Final   Influenza B by PCR NEGATIVE NEGATIVE Final    Comment: (NOTE) The Xpert Xpress SARS-CoV-2/FLU/RSV plus assay is intended as an aid in the diagnosis of influenza from Nasopharyngeal swab specimens and should not be used as a sole basis for treatment. Nasal washings and aspirates are unacceptable for Xpert Xpress SARS-CoV-2/FLU/RSV testing.  Fact Sheet for Patients: EntrepreneurPulse.com.au  Fact Sheet for Healthcare Providers: IncredibleEmployment.be  This test is not yet approved or cleared by the [place] FDA and has been authorized for detection and/or diagnosis of SARS-CoV-2 by FDA under an Emergency Use Authorization (EUA). This EUA will remain in effect (meaning this test can be used) for the duration of the COVID-19 declaration under Section 564(b)(1) of the Act, 21 U.S.C. section 360bbb-3(b)(1), unless the authorization is terminated or revoked.  Performed at [facility], [place] [address], [place], [place] [address]   MRSA PCR Screening     Status: None   Collection Time: 01/12/21  1:47 AM   Specimen: Nasopharyngeal  Result Value Ref Range Status   MRSA by PCR NEGATIVE NEGATIVE Final    Comment:        The GeneXpert MRSA Assay (FDA approved for NASAL specimens only), is one  component of a comprehensive MRSA colonization surveillance program. It is not intended to diagnose MRSA infection nor to guide or monitor treatment for MRSA infections. Performed at [facility], [place] [address] [place], [place] [address]         Radiology Studies: ECHOCARDIOGRAM COMPLETE  Result Date: 01/15/2021    ECHOCARDIOGRAM REPORT   Patient Name:   NOELL LORENSEN Date of Exam: 01/15/2021 Medical Rec #:  [id number]    Height:       61.5 in Accession #:    [id number]   Weight:       88.0 lb Date of Birth:  Aug 09, 1933     BSA:          1.340 m Patient Age:    57 years     BP:            127/78 mmHg Patient Gender: F            HR:           83 bpm. Exam Location:  Inpatient Procedure: 2D Echo, Color Doppler, Cardiac Doppler and 3D Echo Indications:    Other Cardiac Sounds R01.2; Z01.818 Encounter for other                 preprocedural examination  History:        Patient has prior history of Echocardiogram examinations, most                 recent 08/07/2012. COPD; Risk Factors:Hypertension and                 Dyslipidemia.  Sonographer:    Jonelle Sidle Dance Referring Phys: [id number] ELIZABETH [place]  1. There is severe aortic stenosis with AVA 0.84cm2, mean gradient 54 mmHg, peak gradient 72mmHg, Vmax 4.14m/s, DI 0.27.  2. The aortic valve is calcified. There is severe calcifcation of the aortic valve. There is severe thickening of the aortic valve. Aortic valve regurgitation is mild.  3. Left ventricular ejection fraction, by estimation, is 60 to 65%. The left ventricle has normal function. The left ventricle has no regional wall motion abnormalities.  4. There is severe hypertrophy of the basal septum measuring 1.8cm. The rest of the LV segments demonstrate moderate hypertrophy. An intracavitary gradient is present with no evidence of SAM.  5. Left ventricular diastolic parameters are consistent with Grade II diastolic dysfunction (pseudonormalization).  6. Elevated left atrial pressure.  7. Right ventricular systolic function is normal. The right ventricular size is normal.  8. Left atrial size was moderately dilated.  9. There is moderate thickening of the mitral valve leaflet(s). There is mild calcification of the mitral valve leaflet(s). Mild mitral annular calcification. Mild mitral valve regurgitation. 10. The inferior vena cava is dilated in size with >50% respiratory variability, suggesting right atrial pressure of 8 mmHg. Comparison(s): Compared to echo report in 2013, there is now severe aortic stenosis. FINDINGS  Left Ventricle: Left ventricular ejection fraction, by  estimation, is 65 to 70%. The left ventricle has normal function. The left ventricle has no regional wall motion abnormalities. The left ventricular internal cavity size was normal in size. There is  servere hypertrophy of the basal septal segments. The rest of the LV segments demonstrate moderate hypertrophy. Left ventricular diastolic parameters are consistent with Grade II diastolic dysfunction (pseudonormalization). Elevated left atrial pressure. The E/e' is 77. Right Ventricle: The right ventricular size is normal. No increase in right ventricular wall thickness. Right ventricular systolic  function is normal. Left Atrium: Left atrial size was moderately dilated. Right Atrium: Right atrial size was normal in size. Pericardium: Trivial pericardial effusion is present. Mitral Valve: The mitral valve is abnormal. There is moderate thickening of the mitral valve leaflet(s). There is mild calcification of the mitral valve leaflet(s). Mild mitral annular calcification. Mild mitral valve regurgitation. Tricuspid Valve: The tricuspid valve is normal in structure. Tricuspid valve regurgitation is trivial. Aortic Valve: There is severe aortic stenosis with AVA 0.84cm2, mean gradient 54 mmHg, peak gradient 52mmHg, Vmax 4.56m/s, DI 0.27. The aortic valve is calcified. There is severe calcifcation of the aortic valve. There is severe thickening of the aortic valve. Aortic valve regurgitation is mild. Aortic regurgitation PHT measures 373 msec. Pulmonic Valve: The pulmonic valve was normal in structure. Pulmonic valve regurgitation is trivial. Aorta: The aortic root and ascending aorta are structurally normal, with no evidence of dilitation. Venous: The inferior vena cava is dilated in size with greater than 50% respiratory variability, suggesting right atrial pressure of 8 mmHg. IAS/Shunts: No atrial level shunt detected by color flow Doppler.  LEFT VENTRICLE PLAX 2D LVIDd:         3.30 cm  Diastology LVIDs:         2.10 cm   LV e' medial:    5.66 cm/s LV PW:         1.30 cm  LV E/e' medial:  28.6 LV IVS:        1.20 cm  LV e' lateral:   4.35 cm/s LVOT diam:     2.00 cm  LV E/e' lateral: 37.2 LV SV:         83 LV SV Index:   62 LVOT Area:     3.14 cm  RIGHT VENTRICLE             IVC RV Basal diam:  2.30 cm     IVC diam: 2.30 cm RV S prime:     14.10 cm/s TAPSE (M-mode): 1.6 cm LEFT ATRIUM             Index       RIGHT ATRIUM          Index LA diam:        3.70 cm 2.76 cm/m  RA Area:     9.22 cm LA Vol (A2C):   36.5 ml 27.23 ml/m RA Volume:   16.60 ml 12.39 ml/m LA Vol (A4C):   56.0 ml 41.78 ml/m LA Biplane Vol: 46.2 ml 34.47 ml/m  AORTIC VALVE AV Area (Vmax):    0.84 cm AV Area (Vmean):   0.86 cm AV Area (VTI):     0.93 cm AV Vmax:           424.00 cm/s AV Vmean:          307.200 cm/s AV VTI:            0.885 m AV Peak Grad:      71.9 mmHg AV Mean Grad:      43.6 mmHg LVOT Vmax:         114.00 cm/s LVOT Vmean:        84.300 cm/s LVOT VTI:          0.263 m LVOT/AV VTI ratio: 0.30 AI PHT:            373 msec  AORTA Ao Root diam: 3.20 cm Ao Asc diam:  3.10 cm MITRAL VALVE MV Area (PHT): 2.54 cm  SHUNTS MV Decel Time: 299 msec     Systemic VTI:  0.26 m MV E velocity: 162.00 cm/s  Systemic Diam: 2.00 cm MV A velocity: 145.00 cm/s MV E/A ratio:  1.12 Gwyndolyn Kaufman MD Electronically signed by Gwyndolyn Kaufman MD Signature Date/Time: 01/15/2021/10:43:34 AM    Final         Scheduled Meds: . sodium chloride   Intravenous Once  . sodium chloride   Intravenous Once  . budesonide  6 mg Oral Daily  . calcium carbonate  1,250 mg Oral Daily  . Chlorhexidine Gluconate Cloth  6 each Topical Daily  . insulin aspart  0-15 Units Subcutaneous TID WC  . pantoprazole (PROTONIX) IV  40 mg Intravenous Q24H  . pravastatin  10 mg Oral Daily   Continuous Infusions: . sodium chloride Stopped (01/16/21 0641)  . acetaminophen Stopped (01/16/21 1209)  . ciprofloxacin Stopped (01/16/21 1287)  . dextrose 5 % and 0.45 % NaCl with KCl  20 mEq/L 80 mL/hr at 01/16/21 1319  . methocarbamol (ROBAXIN) IV    . metronidazole Stopped (01/16/21 0902)     LOS: 5 days     Cordelia Poche, MD [facility] 01/16/2021, 2:04 PM  If 7PM-7AM, please contact night-coverage [email or web address]

## 2021-01-16 NOTE — Progress Notes (Signed)
Central line placed and Xray for placement being taken now PRBCs about to start.  Spoke to Dr. Rosendo Gros and was her during his evaluation.  No visible source of bleeding from the ostomy. Critical care here and will provide ketamine for procedural sedation.  Colonoscopy planned tonight at bedside - endoscopy team on the way.  Patient agreeable  - BARS discussed.  The benefits and risks of the planned procedure were described in detail with the patient or (when appropriate) their health care proxy.  Risks were outlined as including, but not limited to, bleeding, infection, perforation, adverse medication reaction leading to cardiac or pulmonary decompensation, surgical site/colostomy disruption and death.  The limitation of incomplete mucosal visualization was also discussed.  No guarantees or warranties were given. Patient at increased risk for cardiopulmonary complications of procedure due to medical comorbidities.  Kathleen Pope understands this is a high risk procedure and wishes to proceed in an effort to save her life.  I spoke with her daughter Kathleen Pope again with an update.  - Wilfrid Lund, MD

## 2021-01-16 NOTE — Progress Notes (Signed)
SBP in 70's. On Call surgeon, Dr. Rosendo Gros ordered 1L LR bolus, and agreed with 1 unit PRBC transfusion. This RN will continue to carefully monitor pt.

## 2021-01-16 NOTE — Progress Notes (Signed)
Operative note reviewed yesterday - severe ischemic colitis requiring partial colectomy and colostomy.  We greatly appreciate the care from the medical and surgical service for this patient.  Nothing further to add from GI standpoint now since bleeding source treated surgically.  We will sign off and remain available if needed.  Wilfrid Lund, MD

## 2021-01-16 NOTE — Consult Note (Signed)
NAME:  Kathleen Pope, MRN:  [id number], DOB:  27-Apr-1933, LOS: 5 ADMISSION DATE:  01/11/2021, CONSULTATION DATE: 01/16/21 REFERRING MD:  Fayne Mediate CHIEF COMPLAINT:  Blood in stool   Brief History:  87 year old recurrent bleed s/p VIR embolization with likely resultant ischemic colitis s/p partial colectomy with ongoing hematochezia and new hypotension.  History of Present Illness:  History largely per EMR as patient not very conversant. Recently discharged 2/26. Lower GI bleed with positive nuc scan but no target during angiogram. Aspirin held. Bleeding better. Readmitted 2/28 with hematochezia. VIR Embo after CT angio abd/pelvis. Still with bleeding. Colo 3/2 with rectal ulcers, ischemic changes. Partial distal colectomy 3/3. Ongoing bleeding high volume 3/4 PM. SBP in 70s. 1L LR ordered. 2 u PRBC ordered. None given at time of assessment. Hgb 7.2 from 9.9 this AM.   Past Medical History:  Multiple GI bleed, AVM s/p remote partial colectomy HTN  Significant Hospital Events:  2/27 - VIR EMBO 3/2 EGD clear, rectal ulcer on flex sig 3/3 distal colectomy with ostomy   Consults:  Surgery IR PCCM  Procedures:  2/28 IR embo 3/3 partial (distal) colectomy with ostomy  Significant Diagnostic Tests:  2/27 CT angio  Mid to distal descending colon gastrointestinal hemorrhage that is located proximal to the colonic anastomosis 3/2 EGD clear 3/2 flex sig rectal ulcers with bleeding  Micro Data:  n/a  Antimicrobials:  n/a  Interim History / Subjective:  Bps low. Responding to fluids. She feels ok. Spunky.  Objective   Blood pressure (!) 99/59, pulse (!) 109, temperature 98.2 F (36.8 C), temperature source Oral, resp. rate (!) 21, height 5' 1.5" (1.562 m), weight 39.9 kg, SpO2 95 %.        Intake/Output Summary (Last 24 hours) at 01/16/2021 1829 Last data filed at 01/16/2021 1804 Gross per 24 hour  Intake 2563.1 ml  Output 1700 ml  Net 863.1 ml   Filed Weights   01/11/21 1908  01/14/21 1533  Weight: 39.9 kg 39.9 kg    Examination: General: awake, alert Eyes: EOMI, no icterus Lungs: CTAB, NWOB Cardiovascular: warm, RR Abdomen: mild tenderness, ostomy with red blood   Resolved Hospital Problem list   n/a  Assessment & Plan:  Hypotension: Hypovolemic as NPO for some time as well as hemorrhagic with hematochezia via ostomy, Hgb drop. BP improving after ~500 cc of first 1L bolus ordered. Yet to receive blood --IVF bolus as needed --2u PRBC ordered, not yet given --MAP > 65 --Advised TRH to contact VIR --Surgery following    Best practice (evaluated daily)  Per primary  Goals of Care:  Per primary  Labs   CBC: Recent Labs  Lab 01/11/21 2015 01/12/21 0700 01/12/21 1932 01/13/21 0347 01/13/21 1921 01/14/21 0519 01/14/21 0905 01/14/21 1524 01/14/21 1550 01/15/21 1316 01/16/21 0539 01/16/21 1547  WBC 8.4   < > 16.0* 18.2*  --  17.0*  --   --   --   --  13.1* 11.8*  NEUTROABS 6.2  --   --  14.7*  --  13.3*  --   --   --   --   --   --   HGB 8.7*   < > 7.3* 7.0*   < > 8.3*   < > 7.5* 7.7* 9.2* 9.9* 7.2*  HCT 25.7*   < > 21.6* 20.2*   < > 25.3*   < > 22.0* 22.4* 27.0* 29.7* 21.9*  MCV 97.3   < > 95.2 95.7  --  90.0  --   --   --   --  85.8 89.4  PLT 225   < > 191 198  --  160  --   --   --   --  307 274   < > = values in this interval not displayed.    Basic Metabolic Panel: Recent Labs  Lab 01/11/21 2015 01/12/21 0700 01/13/21 0347 01/14/21 0519 01/14/21 1524 01/15/21 1316 01/16/21 0539  NA 143 142 140 136 135 139 135  K 3.6 3.3* 4.6 4.1 3.9 3.9 3.6  CL 106 110 107 106 101  --  105  CO2 27 23 25 24   --   --  25  GLUCOSE 200* 107* 121* 114* 103*  --  228*  BUN 16 16 12 16 15   --  14  CREATININE 0.85 0.59 0.74 0.76 0.70  --  0.64  CALCIUM 8.4* 7.3* 8.0* 7.3*  --   --  7.2*  MG  --   --  1.8 1.8  --   --   --   PHOS  --   --  2.6 4.4  --   --   --    GFR: Estimated Creatinine Clearance: 32.4 mL/min (by C-G formula based on SCr  of 0.64 mg/dL). Recent Labs  Lab 01/13/21 0347 01/14/21 0519 01/16/21 0539 01/16/21 1547  WBC 18.2* 17.0* 13.1* 11.8*    Liver Function Tests: Recent Labs  Lab 01/11/21 2015 01/13/21 0347 01/14/21 0519 01/16/21 0539  AST 19 18 15 19   ALT 15 11 10 13   ALKPHOS 43 39 38 39  BILITOT 0.4 0.8 1.0 0.7  PROT 5.4* 4.7* 4.0* 4.6*  ALBUMIN 3.0* 2.6* 2.1* 2.0*   No results for input(s): LIPASE, AMYLASE in the last 168 hours. No results for input(s): AMMONIA in the last 168 hours.  ABG    Component Value Date/Time   PHART 7.262 (L) 01/15/2021 1316   PCO2ART 50.7 (H) 01/15/2021 1316   PO2ART 101 01/15/2021 1316   HCO3 22.8 01/15/2021 1316   TCO2 24 01/15/2021 1316   ACIDBASEDEF 4.0 (H) 01/15/2021 1316   O2SAT 97.0 01/15/2021 1316     Coagulation Profile: No results for input(s): INR, PROTIME in the last 168 hours.  Cardiac Enzymes: No results for input(s): CKTOTAL, CKMB, CKMBINDEX, TROPONINI in the last 168 hours.  HbA1C: Hgb A1c MFr Bld  Date/Time Value Ref Range Status  01/14/2021 09:05 AM 7.4 (H) 4.8 - 5.6 % Final    Comment:    (NOTE) Pre diabetes:          5.7%-6.4%  Diabetes:              >6.4%  Glycemic control for   <7.0% adults with diabetes     CBG: Recent Labs  Lab 01/15/21 1822 01/15/21 2159 01/16/21 0748 01/16/21 1114 01/16/21 1553  GLUCAP 159* 183* 174* 125* 125*    Review of Systems:   No CP, orthopnea, or PND. Mild abdominal pain. Comprehensive ROS otherwise negative.   Past Medical History:  She,  has a past medical history of Anemia, Chronic airway obstruction, not elsewhere classified, COPD (chronic obstructive pulmonary disease) ([place]), Diverticulosis, GI hemorrhage from post-treatment cecal ulcer (08/18/2012), Hemorrhoids, HTN (hypertension), Hypercholesteremia, and Lower GI bleed (multiple).   Surgical History:   Past Surgical History:  Procedure Laterality Date  . BUNIONECTOMY     left  . COLECTOMY    . COLONOSCOPY  08/10/2012    Procedure: COLONOSCOPY;  Surgeon: Norberto Sorenson  Sindy Guadeloupe, MD,FACG;  Location: [facility];  Service: Endoscopy;  Laterality: N/A;  . COLONOSCOPY  08/19/2012   Procedure: COLONOSCOPY;  Surgeon: Gatha Mayer, MD;  Location: [facility];  Service: Endoscopy;  Laterality: N/A;  . COLONOSCOPY N/A 03/11/2013   Procedure: COLONOSCOPY;  Surgeon: Irene Shipper, MD;  Location: [facility];  Service: Endoscopy;  Laterality: N/A;  . COLONOSCOPY WITH PROPOFOL N/A 01/10/2021   Procedure: COLONOSCOPY WITH PROPOFOL;  Surgeon: Irene Shipper, MD;  Location: [facility];  Service: Endoscopy;  Laterality: N/A;  . COLOSTOMY N/A 01/15/2021   Procedure: COLOSTOMY;  Surgeon: Erroll Luna, MD;  Location: [facility];  Service: General;  Laterality: N/A;  . ESOPHAGOGASTRODUODENOSCOPY (EGD) WITH PROPOFOL N/A 01/14/2021   Procedure: ESOPHAGOGASTRODUODENOSCOPY (EGD) WITH PROPOFOL;  Surgeon: Doran Stabler, MD;  Location: [facility];  Service: Endoscopy;  Laterality: N/A;  . FLEXIBLE SIGMOIDOSCOPY N/A 01/14/2021   Procedure: FLEXIBLE SIGMOIDOSCOPY;  Surgeon: Doran Stabler, MD;  Location: [facility];  Service: Endoscopy;  Laterality: N/A;  . HOT HEMOSTASIS N/A 01/10/2021   Procedure: HOT HEMOSTASIS (ARGON PLASMA COAGULATION/BICAP);  Surgeon: Irene Shipper, MD;  Location: [facility];  Service: Endoscopy;  Laterality: N/A;  . IR ANGIOGRAM SELECTIVE EACH ADDITIONAL VESSEL  01/12/2021  . IR ANGIOGRAM SELECTIVE EACH ADDITIONAL VESSEL  01/12/2021  . IR ANGIOGRAM SELECTIVE EACH ADDITIONAL VESSEL  01/12/2021  . IR ANGIOGRAM VISCERAL SELECTIVE  01/09/2021  . IR ANGIOGRAM VISCERAL SELECTIVE  01/12/2021  . IR EMBO ARTERIAL NOT HEMORR HEMANG INC GUIDE ROADMAPPING  01/12/2021  . IR US GUIDE VASC ACCESS RIGHT  01/09/2021  . IR US GUIDE VASC ACCESS RIGHT  01/12/2021  . PARTIAL COLECTOMY N/A 01/15/2021   Procedure: PARTIAL COLECTOMY;  Surgeon: Erroll Luna, MD;  Location: [facility];  Service: General;  Laterality: N/A;  . POLYPECTOMY   01/10/2021   Procedure: POLYPECTOMY;  Surgeon: Irene Shipper, MD;  Location: [facility];  Service: Endoscopy;;  . TONSILLECTOMY       Social History:   reports that she has been smoking cigarettes. She has a 59.00 pack-year smoking history. She has never used smokeless tobacco. She reports that she does not drink alcohol and does not use drugs.   Family History:  Her family history includes Anuerysm in her brother; Breast cancer in her mother and sister; Lung cancer in her father. There is no history of Colon cancer, Esophageal cancer, or Rectal cancer.   Allergies No Known Allergies   Home Medications  Prior to Admission medications   Medication Sig Start Date End Date Taking? Authorizing Provider  aspirin EC 81 MG tablet Take 1 tablet (81 mg total) by mouth every other day. 01/17/21  Yes Nita Sells, MD  budesonide (ENTOCORT EC) 3 MG 24 hr capsule Take 9mg  for one month; then 6 mg for one month; then 3 mg for one month; then stop Patient taking differently: Take 6 mg by mouth daily. Take 9mg  for one month; then 6 mg for one month; then 3 mg for one month; then stop 03/12/20  Yes Irene Shipper, MD  calcium carbonate (OS-CAL) 600 MG TABS Take 600 mg by mouth daily.   Yes [provider]  ECHINACEA PO Take 1 capsule by mouth daily.   Yes [provider]  ferrous sulfate 325 (65 FE) MG EC tablet Take 325 mg by mouth 2 (two) times daily.    Yes [provider]  losartan (COZAAR) 50 MG tablet Take 50 mg by mouth daily.  Yes [provider]  pravastatin (PRAVACHOL) 10 MG tablet Take 10 mg by mouth daily.   Yes [provider]  pantoprazole (PROTONIX) 40 MG tablet Take 1 tablet (40 mg total) by mouth 2 (two) times daily. 01/10/21 03/11/21  Nita Sells, MD     Critical care time:     CRITICAL CARE Performed by: Lanier Clam   Total critical care time: 32 minutes  Critical care time was exclusive of separately billable  procedures and treating other patients.  Critical care was necessary to treat or prevent imminent or life-threatening deterioration.  Critical care was time spent personally by me on the following activities: development of treatment plan with patient and/or surrogate as well as nursing, discussions with consultants, evaluation of patient's response to treatment, examination of patient, obtaining history from patient or surrogate, ordering and performing treatments and interventions, ordering and review of laboratory studies, ordering and review of radiographic studies, pulse oximetry and re-evaluation of patient's condition.

## 2021-01-17 ENCOUNTER — Inpatient Hospital Stay (HOSPITAL_COMMUNITY): Payer: Medicare Other | Admitting: Certified Registered Nurse Anesthetist

## 2021-01-17 ENCOUNTER — Encounter (HOSPITAL_COMMUNITY): Payer: Self-pay | Admitting: Internal Medicine

## 2021-01-17 ENCOUNTER — Encounter (HOSPITAL_COMMUNITY): Admission: EM | Disposition: A | Discharge status: Home Health | Payer: Self-pay | Source: Home / Self Care

## 2021-01-17 ENCOUNTER — Inpatient Hospital Stay (HOSPITAL_COMMUNITY): Payer: Medicare Other

## 2021-01-17 DIAGNOSIS — I10 Essential (primary) hypertension: Secondary | ICD-10-CM | POA: Diagnosis not present

## 2021-01-17 DIAGNOSIS — K5520 Angiodysplasia of colon without hemorrhage: Secondary | ICD-10-CM | POA: Diagnosis not present

## 2021-01-17 DIAGNOSIS — K625 Hemorrhage of anus and rectum: Secondary | ICD-10-CM | POA: Diagnosis not present

## 2021-01-17 DIAGNOSIS — D62 Acute posthemorrhagic anemia: Secondary | ICD-10-CM | POA: Diagnosis not present

## 2021-01-17 DIAGNOSIS — K922 Gastrointestinal hemorrhage, unspecified: Secondary | ICD-10-CM | POA: Diagnosis not present

## 2021-01-17 HISTORY — PX: COLON RESECTION: SHX5231

## 2021-01-17 LAB — GLUCOSE, CAPILLARY
Glucose-Capillary: 151 mg/dL — ABNORMAL HIGH (ref 70–99)
Glucose-Capillary: 126 mg/dL — ABNORMAL HIGH (ref 70–99)
Glucose-Capillary: 112 mg/dL — ABNORMAL HIGH (ref 70–99)

## 2021-01-17 LAB — CBC
HCT: 28.9 % — ABNORMAL LOW (ref 36.0–46.0)
Hemoglobin: 9.7 g/dL — ABNORMAL LOW (ref 12.0–15.0)
MCH: 29.5 pg (ref 26.0–34.0)
MCHC: 33.6 g/dL (ref 30.0–36.0)
MCV: 87.8 fL (ref 80.0–100.0)
Platelets: 214 10*3/uL (ref 150–400)
RBC: 3.29 MIL/uL — ABNORMAL LOW (ref 3.87–5.11)
RDW: 15.7 % — ABNORMAL HIGH (ref 11.5–15.5)
WBC: 15 10*3/uL — ABNORMAL HIGH (ref 4.0–10.5)
nRBC: 0.1 % (ref 0.0–0.2)

## 2021-01-17 LAB — MAGNESIUM: Magnesium: 1.6 mg/dL — ABNORMAL LOW (ref 1.7–2.4)

## 2021-01-17 LAB — COMPREHENSIVE METABOLIC PANEL
ALT: 14 U/L (ref 0–44)
AST: 24 U/L (ref 15–41)
Albumin: 1.7 g/dL — ABNORMAL LOW (ref 3.5–5.0)
Alkaline Phosphatase: 33 U/L — ABNORMAL LOW (ref 38–126)
Anion gap: 6 (ref 5–15)
BUN: 12 mg/dL (ref 8–23)
CO2: 22 mmol/L (ref 22–32)
Calcium: 6.7 mg/dL — ABNORMAL LOW (ref 8.9–10.3)
Chloride: 104 mmol/L (ref 98–111)
Creatinine, Ser: 0.58 mg/dL (ref 0.44–1.00)
GFR, Estimated: >60 mL/min (ref >60)
Glucose, Bld: 163 mg/dL — ABNORMAL HIGH (ref 70–99)
Potassium: 3.4 mmol/L — ABNORMAL LOW (ref 3.5–5.1)
Sodium: 132 mmol/L — ABNORMAL LOW (ref 135–145)
Total Bilirubin: 1.7 mg/dL — ABNORMAL HIGH (ref 0.3–1.2)
Total Protein: 3.8 g/dL — ABNORMAL LOW (ref 6.5–8.1)

## 2021-01-17 SURGERY — COLON RESECTION
Anesthesia: General | Site: Abdomen

## 2021-01-17 MED ORDER — FENTANYL CITRATE (PF) 100 MCG/2ML IJ SOLN
INTRAMUSCULAR | Status: AC
  Filled 2021-01-17: qty 2

## 2021-01-17 MED ORDER — FENTANYL CITRATE (PF) 100 MCG/2ML IJ SOLN
25.0000 ug | INTRAMUSCULAR | Status: DC | PRN
  Administered 2021-01-17 (×3): 50 ug via INTRAVENOUS

## 2021-01-17 MED ORDER — SODIUM CHLORIDE 0.9% FLUSH
10.0000 mL | Freq: Two times a day (BID) | INTRAVENOUS | Status: DC
  Administered 2021-01-17 – 2021-01-22 (×9): 10 mL
  Administered 2021-01-22: 30 mL
  Administered 2021-01-23 – 2021-01-28 (×10): 10 mL

## 2021-01-17 MED ORDER — LIDOCAINE 2% (20 MG/ML) 5 ML SYRINGE
INTRAMUSCULAR | Status: AC
  Filled 2021-01-17: qty 5

## 2021-01-17 MED ORDER — PROPOFOL 10 MG/ML IV BOLUS
INTRAVENOUS | Status: AC
  Filled 2021-01-17: qty 20

## 2021-01-17 MED ORDER — ACETAMINOPHEN 10 MG/ML IV SOLN: 1000.0000 mg | Freq: Once | INTRAVENOUS | Status: DC | PRN

## 2021-01-17 MED ORDER — MAGNESIUM SULFATE 2 GM/50ML IV SOLN
2.0000 g | Freq: Once | INTRAVENOUS | Status: AC
  Administered 2021-01-17: 2 g via INTRAVENOUS

## 2021-01-17 MED ORDER — PROMETHAZINE HCL 25 MG/ML IJ SOLN: 6.2500 mg | INTRAMUSCULAR | Status: DC | PRN

## 2021-01-17 MED ORDER — ETOMIDATE 2 MG/ML IV SOLN
INTRAVENOUS | Status: DC | PRN
  Administered 2021-01-17: 12 mg via INTRAVENOUS

## 2021-01-17 MED ORDER — PHENYLEPHRINE HCL-NACL 10-0.9 MG/250ML-% IV SOLN: INTRAVENOUS | Status: DC | PRN

## 2021-01-17 MED ORDER — ROCURONIUM BROMIDE 10 MG/ML (PF) SYRINGE
PREFILLED_SYRINGE | INTRAVENOUS | Status: AC
  Filled 2021-01-17: qty 10

## 2021-01-17 MED ORDER — LIDOCAINE HCL (CARDIAC) PF 100 MG/5ML IV SOSY
PREFILLED_SYRINGE | INTRAVENOUS | Status: DC | PRN
  Administered 2021-01-17: 60 mg via INTRAVENOUS

## 2021-01-17 MED ORDER — SUGAMMADEX SODIUM 200 MG/2ML IV SOLN
INTRAVENOUS | Status: DC | PRN
  Administered 2021-01-17: 100 mg via INTRAVENOUS

## 2021-01-17 MED ORDER — SUCCINYLCHOLINE CHLORIDE 200 MG/10ML IV SOSY
PREFILLED_SYRINGE | INTRAVENOUS | Status: AC
  Filled 2021-01-17: qty 10

## 2021-01-17 MED ORDER — ROCURONIUM BROMIDE 100 MG/10ML IV SOLN
INTRAVENOUS | Status: DC | PRN
  Administered 2021-01-17: 40 mg via INTRAVENOUS

## 2021-01-17 MED ORDER — ESMOLOL HCL 100 MG/10ML IV SOLN
INTRAVENOUS | Status: DC | PRN
Start: 2021-01-17 — End: 2021-01-17
  Administered 2021-01-17: 30 ug via INTRAVENOUS

## 2021-01-17 MED ORDER — FENTANYL CITRATE (PF) 100 MCG/2ML IJ SOLN
INTRAMUSCULAR | Status: DC | PRN
  Administered 2021-01-17: 50 ug via INTRAVENOUS

## 2021-01-17 MED ORDER — SODIUM CHLORIDE 0.9% FLUSH: 10.0000 mL | INTRAVENOUS | Status: DC | PRN

## 2021-01-17 MED ORDER — FUROSEMIDE 10 MG/ML IJ SOLN
40.0000 mg | Freq: Once | INTRAMUSCULAR | Status: AC
  Administered 2021-01-17: 40 mg via INTRAVENOUS
  Filled 2021-01-17: qty 4

## 2021-01-17 MED ORDER — POTASSIUM CHLORIDE 10 MEQ/50ML IV SOLN
INTRAVENOUS | Status: AC
  Filled 2021-01-17: qty 50

## 2021-01-17 MED ORDER — PHENYLEPHRINE HCL (PRESSORS) 10 MG/ML IV SOLN
INTRAVENOUS | Status: DC | PRN
  Administered 2021-01-17 (×2): 80 ug via INTRAVENOUS

## 2021-01-17 MED ORDER — LACTATED RINGERS IV SOLN: INTRAVENOUS | Status: DC | PRN

## 2021-01-17 MED ORDER — FENTANYL CITRATE (PF) 100 MCG/2ML IJ SOLN
50.0000 ug | INTRAMUSCULAR | Status: AC | PRN
  Administered 2021-01-17 (×2): 50 ug via INTRAVENOUS

## 2021-01-17 MED ORDER — POTASSIUM CHLORIDE 10 MEQ/50ML IV SOLN
10.0000 meq | INTRAVENOUS | Status: AC
  Administered 2021-01-17 (×4): 10 meq via INTRAVENOUS
  Filled 2021-01-17 (×3): qty 50

## 2021-01-17 MED ORDER — PHENYLEPHRINE HCL-NACL 10-0.9 MG/250ML-% IV SOLN
INTRAVENOUS | Status: DC | PRN
  Administered 2021-01-17: 25 ug/min via INTRAVENOUS

## 2021-01-17 MED ORDER — 0.9 % SODIUM CHLORIDE (POUR BTL) OPTIME
TOPICAL | Status: DC | PRN
  Administered 2021-01-17: 2000 mL

## 2021-01-17 MED ORDER — KCL IN DEXTROSE-NACL 20-5-0.45 MEQ/L-%-% IV SOLN
INTRAVENOUS | Status: DC
  Filled 2021-01-17 (×2): qty 1000

## 2021-01-17 MED ORDER — ALBUTEROL SULFATE (2.5 MG/3ML) 0.083% IN NEBU
2.5000 mg | INHALATION_SOLUTION | RESPIRATORY_TRACT | Status: DC | PRN
  Administered 2021-01-17: 2.5 mg via RESPIRATORY_TRACT
  Filled 2021-01-17: qty 3

## 2021-01-17 SURGICAL SUPPLY — 42 items
ADH SKN CLS APL DERMABOND .7 (GAUZE/BANDAGES/DRESSINGS) ×1
APL SWBSTK 6 STRL LF DISP (MISCELLANEOUS) ×2
APPLICATOR COTTON TIP 6 STRL (MISCELLANEOUS) ×2 IMPLANT
APPLICATOR COTTON TIP 6IN STRL (MISCELLANEOUS) ×4
BLADE EXTENDED COATED 6.5IN (ELECTRODE) IMPLANT
BLADE HEX COATED 2.75 (ELECTRODE) ×2 IMPLANT
CLIP VESOCCLUDE LG 6/CT (CLIP) IMPLANT
COVER WAND RF STERILE (DRAPES) IMPLANT
DERMABOND ADVANCED (GAUZE/BANDAGES/DRESSINGS) ×1
DERMABOND ADVANCED .7 DNX12 (GAUZE/BANDAGES/DRESSINGS) ×1 IMPLANT
DRAPE LAPAROSCOPIC ABDOMINAL (DRAPES) ×2 IMPLANT
DRSG PAD ABDOMINAL 8X10 ST (GAUZE/BANDAGES/DRESSINGS) ×1 IMPLANT
ELECT REM PT RETURN 15FT ADLT (MISCELLANEOUS) ×2 IMPLANT
GAUZE SPONGE 4X4 12PLY STRL (GAUZE/BANDAGES/DRESSINGS) ×2 IMPLANT
GLOVE SURG ENC MOIS LTX SZ7.5 (GLOVE) ×4 IMPLANT
GLOVE SURG UNDER POLY LF SZ7 (GLOVE) ×2 IMPLANT
GOWN STRL REUS W/ TWL XL LVL3 (GOWN DISPOSABLE) ×1 IMPLANT
GOWN STRL REUS W/TWL LRG LVL3 (GOWN DISPOSABLE) ×2 IMPLANT
GOWN STRL REUS W/TWL XL LVL3 (GOWN DISPOSABLE) ×4 IMPLANT
KIT TURNOVER KIT A (KITS) ×2 IMPLANT
LEGGING LITHOTOMY PAIR STRL (DRAPES) IMPLANT
NS IRRIG 1000ML POUR BTL (IV SOLUTION) ×4 IMPLANT
PACK COLON (CUSTOM PROCEDURE TRAY) ×2 IMPLANT
PACK GENERAL/GYN (CUSTOM PROCEDURE TRAY) ×2 IMPLANT
PENCIL SMOKE EVACUATOR (MISCELLANEOUS) IMPLANT
SHEARS HARMONIC ACE PLUS 36CM (ENDOMECHANICALS) IMPLANT
STAPLER PROXIMATE 75MM BLUE (STAPLE) ×1 IMPLANT
STAPLER VISISTAT 35W (STAPLE) ×2 IMPLANT
SUT PDS AB 1 CTX 36 (SUTURE) IMPLANT
SUT PDS AB 1 TP1 96 (SUTURE) IMPLANT
SUT PROLENE 2 0 BLUE (SUTURE) IMPLANT
SUT SILK 2 0 (SUTURE) ×2
SUT SILK 2 0 SH CR/8 (SUTURE) ×2 IMPLANT
SUT SILK 2 0SH CR/8 30 (SUTURE) IMPLANT
SUT SILK 2-0 18XBRD TIE 12 (SUTURE) ×1 IMPLANT
SUT SILK 2-0 30XBRD TIE 12 (SUTURE) IMPLANT
SUT SILK 3 0 (SUTURE) ×2
SUT SILK 3 0 SH CR/8 (SUTURE) ×2 IMPLANT
SUT SILK 3-0 18XBRD TIE 12 (SUTURE) ×1 IMPLANT
TAPE CLOTH 4X10 WHT NS (GAUZE/BANDAGES/DRESSINGS) ×1 IMPLANT
TRAY FOLEY MTR SLVR 16FR STAT (SET/KITS/TRAYS/PACK) ×2 IMPLANT
YANKAUER SUCT BULB TIP NO VENT (SUCTIONS) ×2 IMPLANT

## 2021-01-17 NOTE — Anesthesia Procedure Notes (Addendum)
Arterial Line Insertion Start/End3/03/2021 7:00 AM, 01/17/2021 7:05 AM Performed by: [place] Daryan Buell C, CRNA, CRNA  Patient location: Pre-op. Preanesthetic checklist: patient identified, IV checked, site marked, risks and benefits discussed, surgical consent, monitors and equipment checked, pre-op evaluation, timeout performed and anesthesia consent Lidocaine 1% used for infiltration Left, radial was placed Catheter size: 20 Fr Hand hygiene performed  and maximum sterile barriers used   Attempts: 1 Procedure performed without using ultrasound guided technique. Following insertion, dressing applied. Post procedure assessment: normal and unchanged  Patient tolerated the procedure well with no immediate complications.

## 2021-01-17 NOTE — Progress Notes (Signed)
Operative note reviewed, and I spoke with Dr. Rosendo Gros this morning.  Complete colectomy performed. I spoke with Dr. Lonny Prude of the [facility].  Nothing further to add from GI standpoint.  I'm hopeful she will make a recovery.  Signing off -call as need arises.  - H. Loletha Carrow, MD

## 2021-01-17 NOTE — Progress Notes (Signed)
PROGRESS NOTE    Kathleen Pope  PPI:[id number] DOB: 25-Oct-1933 DOA: 01/11/2021 PCP: Redmond School, MD   Brief Narrative: Kathleen Pope is a 87 y.o. 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 Patient presented secondary recurrent GI bleeding. Patient underwent coil embolization which unfortunately appears to have left to ischemic colon requiring partial colectomy. After partial colectomy, patient developed hypotension and brisk GI bleeding requiring total colectomy.    Assessment & Plan:   Principal Problem:   Rectal bleed Active Problems:   Chronic lower GI bleeding   AVM (arteriovenous malformation) of colon   HTN (hypertension)   Acute blood loss anemia   GI bleed   Lower GI bleeding Recurrent diverticular bleed Patient presented with recurrent lower GI bleeding. Recently admitted with a similar presentation. On this admission, she is s/p mesenteric arteriogram with successful coil embolization of left colic artery on 0/63. Recurrent large volume episode of hematochezia overnight on 3/1. GI re-consulted and performed flexible sigmoidoscopy on 3/2 which was significant for colonic ischemia. After partial colectomy, she had recurrent GI bleeding with attempted bedside colonoscopy under conscious sedation. Colonoscopy unsuccessful in identifying lesion secondary to copious blood and recommendation for total colectomy was made which was performed on 3/5.  Colonic ischemia Seen on endoscopy. General surgery consulted and performed ex lap with partial colectomy and end colostomy on 3/3. NG tube placed post-op then removed. Patient underwent total colectomy with end ileostomy on 3/5. -General surgery recommendations: pending today  Acute blood loss anemia Secondary to above. Patient has received 7 units of PRBC for GI bleeding in addition to peri-op transfusions. Hemoglobin appears to be stable  today.  Hypovolemic shock Secondary to blood loss from GI bleeding. Patient required aggressive fluid/blood resuscitation in addition to short management with vasopressor support. Now resolved.  Acute respiratory failure with hypoxia Possibly secondary to fluid overload. -Chest x-ray -Lasix trail -Continue albuterol prb  Severe aortic stenosis Seen on Transthoracic Echocardiogram. Will need outpatient cardiology follow-up.  Hyperglycemia Hemoglobin A1C of 7.4%. Would benefit from metformin on discharge if appropriate. -SSI  COPD Patient is a current smoker. COPD stable.  Primary hypertension Patient is on losartan as an outpatient which was held secondary to hypotension from acute blood loss. -Continue to hold losartan  Collagenous colitis Patient is on budesonide as an outpatient. Currently takes varying doses but most recently was taking 12 mg daily. Currently on prescribed dose of 6 mg daily -Continue budesonide 6 mg daily  Leukocytosis Possibly related to budesonide, but it seems more so secondary to ischemia. Stable.  Hyperlipidemia -Continue Pravastatin  Underweight Weight is currently stable. Down about 10 lbs  from several years ago. -Dietician consult   DVT prophylaxis: SCDs secondary to GI bleeding Code Status:   Code Status: DNR Family Communication: None at bedside Disposition Plan: Discharge home pending general surgery recommendations   Consultants:   Gastroenterology Velora Heckler)  General surgery  Procedures:   POST MESENTERIC ARTERIOGRAM AND PERCUTANEOUS EMBOLIZATION (01/12/2021)  EGD/FLEXIBLE SIGMOIDOSCOPY (01/15/2021)  PARTIAL COLECTOMY (01/15/2021)  Antimicrobials:  Ciprofloxacin  Flagyl   Subjective: Some dyspnea today.  Objective: Vitals:   01/17/21 1019 01/17/21 1030 01/17/21 1035 01/17/21 1045  BP:  (!) 154/76  (!) 151/76  Pulse: 100 (!) 101 (!) 111 (!) 105  Resp: 13 (!) 9 14 (!) 8  Temp:    98 F (36.7 C)  TempSrc:       SpO2: 95% 96% 96% 96%  Weight:      Height:        Intake/Output Summary (Last 24 hours) at 01/17/2021 1221 Last data filed at 01/17/2021 1049 Gross per 24 hour  Intake 5297.59 ml  Output 940 ml  Net 4357.59 ml   Filed Weights   01/11/21 1908 01/14/21 1533  Weight: 39.9 kg 39.9 kg    Examination:  General exam: Appears calm and comfortable Respiratory system: wheezing heard at level of her neck with transmission to bilateral lungs. No stridor. Respiratory effort normal. Cardiovascular system: S1 & S2 heard, RRR. 3/6 systolic murmur Gastrointestinal system: Abdomen is nondistended, soft and tender. No organomegaly or masses felt.. Central nervous system: Alert and oriented. No focal neurological deficits. Musculoskeletal: No calf tenderness Skin: No cyanosis. No rashes Psychiatry: Judgement and insight appear normal. Mood & affect appropriate.    Data Reviewed: I have personally reviewed following labs and imaging studies  CBC Lab Results  Component Value Date   WBC 15.0 (H) 01/17/2021   RBC 3.29 (L) 01/17/2021   HGB 9.7 (L) 01/17/2021   HCT 28.9 (L) 01/17/2021   MCV 87.8 01/17/2021   MCH 29.5 01/17/2021   PLT 214 01/17/2021   MCHC 33.6 01/17/2021   RDW 15.7 (H) 01/17/2021   LYMPHSABS 1.4 01/14/2021   MONOABS 2.1 (H) 01/14/2021   EOSABS 0.1 01/14/2021   BASOSABS 0.0 39/76/7341     Last metabolic panel Lab Results  Component Value Date   NA 132 (L) 01/17/2021   K 3.4 (L) 01/17/2021   CL 104 01/17/2021   CO2 22 01/17/2021   BUN 12 01/17/2021   CREATININE 0.58 01/17/2021   GLUCOSE 163 (H) 01/17/2021   GFRNONAA >60 01/17/2021   GFRAA >90 03/10/2013   CALCIUM 6.7 (L) 01/17/2021   PHOS 4.4 01/14/2021   PROT 3.8 (L) 01/17/2021   ALBUMIN 1.7 (L) 01/17/2021   BILITOT 1.7 (H) 01/17/2021   ALKPHOS 33 (L) 01/17/2021   AST 24 01/17/2021   ALT 14 01/17/2021   ANIONGAP 6 01/17/2021    CBG (last 3)  Recent Labs    01/16/21 1114 01/16/21 1553 01/16/21 2121   GLUCAP 125* 125* 136*     GFR: Estimated Creatinine Clearance: 32.4 mL/min (by C-G formula based on SCr of 0.58 mg/dL).  Coagulation Profile: Recent Labs  Lab 01/16/21 2259  INR 1.4*    Recent Results (from the past 240 hour(s))  Resp Panel by RT-PCR (Flu A&B, Covid) Nasopharyngeal Swab     Status: None   Collection Time: 01/08/21  2:51 PM   Specimen: Nasopharyngeal Swab; Nasopharyngeal(NP) swabs in vial transport medium  Result Value Ref Range Status   SARS Coronavirus 2 by RT PCR NEGATIVE NEGATIVE Final    Comment: (NOTE) SARS-CoV-2 target nucleic acids are NOT DETECTED.  The SARS-CoV-2 RNA is generally detectable in upper respiratory specimens during the acute phase of infection. The lowest concentration of SARS-CoV-2 viral copies this assay can detect is 138 copies/mL. A negative result does not preclude SARS-Cov-2 infection and should not be used as the sole basis for treatment or other patient management decisions. A negative result may occur with  improper specimen collection/handling, submission of specimen other than nasopharyngeal swab, presence of viral mutation(s) within the areas targeted by this assay, and inadequate number of viral copies(<138 copies/mL). A negative result must be combined with clinical observations, patient history, and epidemiological information. The expected result is Negative.  Fact Sheet for Patients:  EntrepreneurPulse.com.au  Fact Sheet for Healthcare Providers:  IncredibleEmployment.be  This test is no t yet approved or cleared by the [place] and  has been authorized for detection and/or diagnosis of SARS-CoV-2 by FDA under an Emergency Use Authorization (EUA). This EUA will remain  in effect (meaning this test can be used) for the duration of the COVID-19 declaration under Section 564(b)(1) of the Act, 21 U.S.C.section 360bbb-3(b)(1), unless the authorization is terminated  or  revoked sooner.       Influenza A by PCR NEGATIVE NEGATIVE Final   Influenza B by PCR NEGATIVE NEGATIVE Final    Comment: (NOTE) The Xpert Xpress SARS-CoV-2/FLU/RSV plus assay is intended as an aid in the diagnosis of influenza from Nasopharyngeal swab specimens and should not be used as a sole basis for treatment. Nasal washings and aspirates are unacceptable for Xpert Xpress SARS-CoV-2/FLU/RSV testing.  Fact Sheet for Patients: EntrepreneurPulse.com.au  Fact Sheet for Healthcare Providers: IncredibleEmployment.be  This test is not yet approved or cleared by the [place] FDA and has been authorized for detection and/or diagnosis of SARS-CoV-2 by FDA under an Emergency Use Authorization (EUA). This EUA will remain in effect (meaning this test can be used) for the duration of the COVID-19 declaration under Section 564(b)(1) of the Act, 21 U.S.C. section 360bbb-3(b)(1), unless the authorization is terminated or revoked.  Performed at [facility], [place] [address] [place], [place] [address]   Resp Panel by RT-PCR (Flu A&B, Covid) Nasopharyngeal Swab     Status: None   Collection Time: 01/11/21  7:53 PM   Specimen: Nasopharyngeal Swab; Nasopharyngeal(NP) swabs in vial transport medium  Result Value Ref Range Status   SARS Coronavirus 2 by RT PCR NEGATIVE NEGATIVE Final    Comment: (NOTE) SARS-CoV-2 target nucleic acids are NOT DETECTED.  The SARS-CoV-2 RNA is generally detectable in upper respiratory specimens during the acute phase of infection. The lowest concentration of SARS-CoV-2 viral copies this assay can detect is 138 copies/mL. A negative result does not preclude SARS-Cov-2 infection and should not be used as the sole basis for treatment or other patient management decisions. A negative result may occur with  improper specimen collection/handling, submission of specimen other than nasopharyngeal swab,  presence of viral mutation(s) within the areas targeted by this assay, and inadequate number of viral copies(<138 copies/mL). A negative result must be combined with clinical observations, patient history, and epidemiological information. The expected result is Negative.  Fact Sheet for Patients:  EntrepreneurPulse.com.au  Fact Sheet for Healthcare Providers:  IncredibleEmployment.be  This test is no t yet approved or cleared by the [place] FDA and  has been authorized for detection and/or diagnosis of SARS-CoV-2 by FDA under an Emergency Use Authorization (EUA). This EUA will remain  in effect (meaning this test can be used) for the duration of the COVID-19 declaration under Section 564(b)(1) of the Act, 21 U.S.C.section 360bbb-3(b)(1), unless the authorization is terminated  or revoked sooner.       Influenza A by PCR NEGATIVE NEGATIVE Final   Influenza B by PCR NEGATIVE NEGATIVE Final    Comment: (NOTE) The Xpert Xpress SARS-CoV-2/FLU/RSV plus assay is intended as an aid in the diagnosis of influenza from Nasopharyngeal swab specimens and should not be used as a sole basis for treatment. Nasal washings and aspirates are unacceptable for Xpert Xpress SARS-CoV-2/FLU/RSV testing.  Fact Sheet for Patients: EntrepreneurPulse.com.au  Fact Sheet for Healthcare Providers: IncredibleEmployment.be  This test is not yet approved or cleared by the [place] FDA and has been authorized for detection  and/or diagnosis of SARS-CoV-2 by FDA under an Emergency Use Authorization (EUA). This EUA will remain in effect (meaning this test can be used) for the duration of the COVID-19 declaration under Section 564(b)(1) of the Act, 21 U.S.C. section 360bbb-3(b)(1), unless the authorization is terminated or revoked.  Performed at [facility], [place] [address], [place], [place] [address]   MRSA  PCR Screening     Status: None   Collection Time: 01/12/21  1:47 AM   Specimen: Nasopharyngeal  Result Value Ref Range Status   MRSA by PCR NEGATIVE NEGATIVE Final    Comment:        The GeneXpert MRSA Assay (FDA approved for NASAL specimens only), is one component of a comprehensive MRSA colonization surveillance program. It is not intended to diagnose MRSA infection nor to guide or monitor treatment for MRSA infections. Performed at [facility], [place] [address], [place], [place] [address]         Radiology Studies: DG CHEST PORT 1 VIEW  Result Date: 01/16/2021 CLINICAL DATA:  Central catheter placement EXAM: PORTABLE CHEST 1 VIEW COMPARISON:  CT angio abdomen and pelvis 01/11/2021 FINDINGS: Left subclavian approach central venous catheter tip terminates in the mid SVC. Telemetry leads and external support devices overlie the chest. Low lung volumes and atelectasis. Layering bilateral effusions. Some patchy and streaky opacities are present throughout both lungs, right greater than left. No visible pneumothorax. The aorta is calcified. The remaining cardiomediastinal contours are unremarkable. No acute osseous or soft tissue abnormality. IMPRESSION: Left subclavian approach central venous catheter tip terminates in the mid SVC. Developing bilateral effusions. Heterogeneous opacities in the lungs, could reflect developing infection and/or edema. Electronically Signed   By: Lovena Le M.D.   On: 01/16/2021 21:10        Scheduled Meds: . sodium chloride   Intravenous Once  . sodium chloride   Intravenous Once  . budesonide  6 mg Oral Daily  . calcium carbonate  1,250 mg Oral Daily  . Chlorhexidine Gluconate Cloth  6 each Topical Daily  . fentaNYL      . fentaNYL      . fentaNYL      . insulin aspart  0-15 Units Subcutaneous TID WC  . pantoprazole (PROTONIX) IV  40 mg Intravenous Q24H  . pravastatin  10 mg Oral Daily   Continuous Infusions: . sodium  chloride Stopped (01/16/21 0641)  . ciprofloxacin Stopped (01/17/21 7096)  . dextrose 5 % and 0.45 % NaCl with KCl 20 mEq/L Stopped (01/17/21 1201)  . methocarbamol (ROBAXIN) IV    . metronidazole 0 mg (01/17/21 0219)  . phenylephrine (NEO-SYNEPHRINE) Adult infusion Stopped (01/16/21 2151)     LOS: 6 days     Cordelia Poche, MD [facility] 01/17/2021, 12:21 PM  If 7PM-7AM, please contact night-coverage [email or web address]

## 2021-01-17 NOTE — Progress Notes (Signed)
Patient's bedside monitor began displaying frequent PVC's/PACs; alarming irregular HR. EKG obtained per protocol and showed ST with frequent PACs and PVCs. Dr. Lonny Prude notified by this RN and also assessed pt at bedside; mag add on ordered by MD. Runs of K and PO Ca already given by this RN. This RN will continue to carefully monitor pt.

## 2021-01-17 NOTE — Progress Notes (Signed)
Critical CARE brief note.  Called by gastroenterology for hypotension and ongoing hematochezia.  On arrival patient was hypotensive with a systolic blood pressure between 70 and 80.  She denied any new abdominal pain but did have further hematochezia from the colostomy site.  Discussed this with surgery and gastroenterology service.  Dr. Rosendo Gros and Dr. Loletha Carrow were both at bedside.  Plan to proceed with colonoscopy via the colostomy site.  Asked by gastroenterology to provide conscious sedation.  Since patient was hypotensive with significant cardiac history.  Ketamine was utilized.  Ketamine 25 mg IV at 2147 hrs. Ketamine 15 mg IV at 2202 hrs. Ketamine 15 mg IV at 2212 hrs. Ketamine 10 mg IV at 2228 hrs.  Respiratory therapist was at bedside  Monitored exhaled CO2 and oxygen saturations throughout the procedure. Patient remained normotensive throughout the procedure.  Critical care time was 75 minutes.

## 2021-01-17 NOTE — Progress Notes (Signed)
Spiritual Care Note   Referred by RN for spiritual/emotional support for daughter Kathleen Pope, who was waiting in the room while Kathleen Pope is in surgery. Kathleen Pope reports strong faith and good support from family and [place] in [place]. Her faith is a significant strength in making meaning of and coping with distress. Together we "had church" as she shared and processed her mom's experience and their faith journey through it. Kathleen Pope was very welcoming of pastoral listening and is aware of ongoing chaplain availability, should needs arise or circumstances change.   01/17/21 1000  Clinical Encounter Type  Visited With Family (daughter Kathleen Pope in room, waiting during surgery)  Visit Type Spiritual support;Social support;Patient in surgery  Referral From Nurse  Spiritual Encounters  Spiritual Needs Emotional  Stress Factors  Patient Stress Factors Loss of control;Major life changes  Family Stress Factors Loss of control;Major life changes    Chaplain Lorrin Jackson, [place], [place] Pager [phone]

## 2021-01-17 NOTE — Anesthesia Preprocedure Evaluation (Addendum)
Anesthesia Evaluation  Patient identified by MRN, date of birth, ID band Patient awake    Reviewed: Allergy & Precautions, NPO status , Patient's Chart, lab work & pertinent test results  History of Anesthesia Complications Negative for: history of anesthetic complications  Airway Mallampati: II  TM Distance: >3 FB Neck ROM: Full    Dental  (+) Missing,    Pulmonary COPD, Current Smoker and Patient abstained from smoking.,    Pulmonary exam normal        Cardiovascular hypertension, Pt. on medications Normal cardiovascular exam     Neuro/Psych negative neurological ROS  negative psych ROS   GI/Hepatic Neg liver ROS, GERD  Medicated and Controlled,  Endo/Other  negative endocrine ROS  Renal/GU negative Renal ROS  negative genitourinary   Musculoskeletal negative musculoskeletal ROS (+)   Abdominal   Peds  Hematology  (+) anemia , Hgb 9.7   Anesthesia Other Findings Day of surgery medications reviewed with patient.  Reproductive/Obstetrics negative OB ROS                             Anesthesia Physical Anesthesia Plan  ASA: III and emergent  Anesthesia Plan: General   Post-op Pain Management:    Induction: Intravenous  PONV Risk Score and Plan: 3 and Treatment may vary due to age or medical condition, Ondansetron and Midazolam  Airway Management Planned: Oral ETT  Additional Equipment: Arterial line and CVP  Intra-op Plan:   Post-operative Plan: Possible Post-op intubation/ventilation  Informed Consent: I have reviewed the patients History and Physical, chart, labs and discussed the procedure including the risks, benefits and alternatives for the proposed anesthesia with the patient or authorized representative who has indicated his/her understanding and acceptance.   Patient has DNR.  Discussed DNR with patient and Continue DNR.   Dental advisory given  Plan Discussed  with: CRNA  Anesthesia Plan Comments: (DNR status discussed with patient. Patient declines chest compressions or defibrillation in event of cardiac arrest. Will continue DNR perioperatively. Daiva Huge, MD)       Anesthesia Quick Evaluation

## 2021-01-17 NOTE — Anesthesia Procedure Notes (Signed)
Procedure Name: Intubation Date/Time: 01/17/2021 8:02 AM Performed by: [place] Charlyn Vialpando C, CRNA Pre-anesthesia Checklist: Patient identified, Emergency Drugs available, Suction available and Patient being monitored Patient Re-evaluated:Patient Re-evaluated prior to induction Oxygen Delivery Method: Circle system utilized Preoxygenation: Pre-oxygenation with 100% oxygen Induction Type: IV induction Ventilation: Mask ventilation without difficulty Laryngoscope Size: Mac and 3 Grade View: Grade I Tube type: Oral Tube size: 7.0 mm Number of attempts: 1 Airway Equipment and Method: Stylet and Oral airway Placement Confirmation: ETT inserted through vocal cords under direct vision,  positive ETCO2 and breath sounds checked- equal and bilateral Secured at: 20 cm Tube secured with: Tape Dental Injury: Teeth and Oropharynx as per pre-operative assessment

## 2021-01-17 NOTE — Transfer of Care (Signed)
Immediate Anesthesia Transfer of Care Note  Patient: Kathleen Pope  Procedure(s) Performed: EXPLORATORY LAPAROTOMY COMPLETION COLECTOMY ileostomy (N/A Abdomen)  Patient Location: PACU  Anesthesia Type:General  Level of Consciousness: awake, alert  and oriented  Airway & Oxygen Therapy: Patient Spontanous Breathing and Patient connected to face mask oxygen  Post-op Assessment: Report given to RN and Post -op Vital signs reviewed and stable  Post vital signs: Reviewed and stable  Last Vitals:  Vitals Value Taken Time  BP 185/88 01/17/21 0933  Temp    Pulse 98 01/17/21 0941  Resp 18 01/17/21 0943  SpO2 94 % 01/17/21 0941  Vitals shown include unvalidated device data.  Last Pain:  Vitals:   01/17/21 0400  TempSrc:   PainSc: Asleep      Patients Stated Pain Goal: 3 (22/48/25 0037)  Complications: No complications documented.

## 2021-01-17 NOTE — Op Note (Signed)
01/17/2021  9:14 AM  PATIENT:  Kathleen Pope  87 y.o. female  PRE-OPERATIVE DIAGNOSIS:  GI bleeding  POST-OPERATIVE DIAGNOSIS:  GI bleeding  PROCEDURE:  Procedure(s): EXPLORATORY LAPAROTOMY COMPLETION COLECTOMY ileostomy (N/A)  SURGEON:  Surgeon(s) and Role:    * Ralene Ok, MD - Primary  ANESTHESIA:   general  EBL:  minimal   BLOOD ADMINISTERED:none  DRAINS: none   LOCAL MEDICATIONS USED:  NONE  SPECIMEN:  Source of Specimen:  Right and transverse colon  DISPOSITION OF SPECIMEN:  PATHOLOGY  COUNTS:  YES  TOURNIQUET:  * No tourniquets in log *  DICTATION: .Dragon Dictation Indication procedure: Patient is an 87 year old female, who comes in secondary to a history of a GI bleed.  Patient had a previous left colectomy after extensive work-up.  Patient continued to have bleeding.  Patient underwent urgent colonoscopy and was found to have some stigmata of bleeding in the right colon.  Patient was consented and underwent completion colectomy.  Findings: Patient had a very large distended cecum, and right colon.  Patient had no active intra-abdominal hemorrhage.  The right and transverse colon was removed.  An ileostomy in a Brooke fashion was placed in the right lower quadrant.  Details of procedure: After the patient was consented she was taken back to the OR and placed in supine position with bilateral SCDs in place.  Patient underwent general endotracheal intubation.  Patient was then prepped and draped in standard fashion.  A timeout was called and all facts verified.  At this time the staples were excised.  And the fascial midline sutures were cut.  The abdomen was entered.  The sutures holding the ostomy were also cut.  At this time I was able to mobilize and eviscerate the patient.  There is large amount of intra-abdominal ascites.  This was evacuated.  This time the cecum was seen to be very large and distended.  I began by incising the white line of Toldt of the  right colon.  This allowed me to medialize the right colon.  I continued this towards the hepatic flexure.  The omentum was taken off of the stomach.  There was some small bowel adhesions to the distal transverse colon.  These were lysed with Metzenbaums.  The ostomy was taken down through the fascial defect.  At this time a LigaSure device was used to ligate the mesentery of the right and transverse colon.  A 75 GIA stapler was then used to transect the distal ileum.  The specimen was handed off.  At this time the area was checked for hemostasis which was excellent.  The left abdominal wall colostomy site fascia was reapproximated using running PDS #1 and standard fashion.  The anterior fascia was reapproximated using interrupted figure-of-eight 0 Novafil's.  The right lower quadrant ileostomy site was chosen.  The skin was excised.  The fascia was then incised in a cruciate fashion.  This allowed 1-1/2 fingerbreadths.  The distal ileum was then brought up through the ostomy site.  The midline fascia was then reapproximated #1 PDS in a standard running fashion x2.  At this time the ileostomy was matured in a Brooke fashion using 3 oh silks and 3-0 Vicryl's.  An ostomy appliance was placed over the ileostomy.  The midline fascia was then packed with saline soaked Kerlix.  The previous ostomy site was packed with saline soaked Kerlix.  Patient taught the procedure well was taken to the recovery in stable condition.   PLAN OF  CARE: Admit to inpatient   PATIENT DISPOSITION:  PACU - hemodynamically stable.   Delay start of Pharmacological VTE agent (>24hrs) due to surgical blood loss or risk of bleeding: not applicable

## 2021-01-17 NOTE — Progress Notes (Signed)
Pt was very hypertensive upon arrival to [facility] from PACU; SBP sustaining in 190's. (Per PACU RN, surgeon was aware of SBP in 170's). Dr. Lonny Prude notified by this RN and verbal orders received to pause Maintenance IV fluids until more normotensive.   Pt was also having audible expiratory wheezes along with Rhonchi. Dr. Lonny Prude gave verbal orders for albuterol nebs prn; see mar. This RN will continue to carefully monitor pt.

## 2021-01-17 NOTE — Progress Notes (Signed)
PT Cancellation Note  Patient Details Name: Kathleen Pope MRN: [id number] DOB: 1933/07/03   Cancelled Treatment:    Reason Eval/Treat Not Completed: Medical issues which prohibited therapy , in surgery. PT will follow up 01/19/21  Claretha Cooper 01/17/2021, 7:54 AM [place] Pager [phone] Office [phone]

## 2021-01-17 NOTE — Anesthesia Postprocedure Evaluation (Signed)
Anesthesia Post Note  Patient: Kathleen Pope  Procedure(s) Performed: EXPLORATORY LAPAROTOMY COMPLETION COLECTOMY ileostomy (N/A Abdomen)     Patient location during evaluation: PACU Anesthesia Type: General Level of consciousness: awake and alert and oriented Pain management: pain level controlled Vital Signs Assessment: post-procedure vital signs reviewed and stable Respiratory status: spontaneous breathing, nonlabored ventilation and respiratory function stable Cardiovascular status: blood pressure returned to baseline Postop Assessment: no apparent nausea or vomiting Anesthetic complications: no   No complications documented.             Brennan Bailey

## 2021-01-17 NOTE — Consult Note (Signed)
NAME:  Kathleen Pope, MRN:  [id number], DOB:  02-13-1933, LOS: 6 ADMISSION DATE:  01/11/2021, CONSULTATION DATE: 01/17/21 REFERRING MD:  Fayne Mediate CHIEF COMPLAINT:  Blood in stool   Brief History:  87 year old recurrent bleed s/p VIR embolization with likely resultant ischemic colitis s/p partial colectomy with ongoing hematochezia and new hypotension.  History of Present Illness:  History largely per EMR as patient not very conversant. Recently discharged 2/26. Lower GI bleed with positive nuc scan but no target during angiogram. Aspirin held. Bleeding better. Readmitted 2/28 with hematochezia. VIR Embo after CT angio abd/pelvis. Still with bleeding. Colo 3/2 with rectal ulcers, ischemic changes. Partial distal colectomy 3/3. Ongoing bleeding high volume 3/4 PM. SBP in 70s. 1L LR ordered. 2 u PRBC ordered. None given at time of assessment. Hgb 7.2 from 9.9 this AM.   Past Medical History:  Multiple GI bleed, AVM s/p remote partial colectomy HTN  Significant Hospital Events:  2/27 - VIR EMBO 3/2 EGD clear, rectal ulcer on flex sig 3/3 distal colectomy with ostomy   Consults:  Surgery IR PCCM  Procedures:  2/28 IR embo 3/3 partial (distal) colectomy with ostomy  Significant Diagnostic Tests:  2/27 CT angio  Mid to distal descending colon gastrointestinal hemorrhage that is located proximal to the colonic anastomosis 3/2 EGD clear 3/2 flex sig rectal ulcers with bleeding 3/5 Completion colectomy  Micro Data:  n/a  Antimicrobials:  n/a  Interim History / Subjective:  Cordis placed overnight. Brief time on pressors. Bps better after blood, adequate resuscitation. Went for completion colectomy this morning. Feeling ok this afternoon. Bps, Hgb stable.   Objective   Blood pressure 110/69, pulse (!) 101, temperature 97.9 F (36.6 C), temperature source Oral, resp. rate 16, height 5' 1.5" (1.562 m), weight 39.9 kg, SpO2 96 %.        Intake/Output Summary (Last 24 hours) at  01/17/2021 1539 Last data filed at 01/17/2021 1311 Gross per 24 hour  Intake 5212.12 ml  Output 940 ml  Net 4272.12 ml   Filed Weights   01/11/21 1908 01/14/21 1533  Weight: 39.9 kg 39.9 kg    Examination: General: awake, alert Eyes: EOMI, no icterus Lungs: CTAB, NWOB Cardiovascular: warm, RR Abdomen: mild tenderness, ostomy with red blood   Resolved Hospital Problem list   n/a  Assessment & Plan:  Hypotension: Hypovolemic as NPO for some time as well as hemorrhagic with hematochezia via ostomy, Hgb drop. BP improved with blood and fluids. Total colectomy 3/5AM. Stable ost op. --IVF bolus as needed for hypotension  We will sign off.   Best practice (evaluated daily)  Per primary  Goals of Care:  Per primary  Labs   CBC: Recent Labs  Lab 01/11/21 2015 01/12/21 0700 01/13/21 0347 01/13/21 1921 01/14/21 0519 01/14/21 0905 01/15/21 1316 01/16/21 0539 01/16/21 1547 01/16/21 2259 01/17/21 0457  WBC 8.4   < > 18.2*  --  17.0*  --   --  13.1* 11.8*  --  15.0*  NEUTROABS 6.2  --  14.7*  --  13.3*  --   --   --   --   --   --   HGB 8.7*   < > 7.0*   < > 8.3*   < > 9.2* 9.9* 7.2* 8.4* 9.7*  HCT 25.7*   < > 20.2*   < > 25.3*   < > 27.0* 29.7* 21.9* 24.8* 28.9*  MCV 97.3   < > 95.7  --  90.0  --   --  85.8 89.4  --  87.8  PLT 225   < > 198  --  160  --   --  307 274  --  214   < > = values in this interval not displayed.    Basic Metabolic Panel: Recent Labs  Lab 01/12/21 0700 01/13/21 0347 01/14/21 0519 01/14/21 1524 01/15/21 1316 01/16/21 0539 01/17/21 0457  NA 142 140 136 135 139 135 132*  K 3.3* 4.6 4.1 3.9 3.9 3.6 3.4*  CL 110 107 106 101  --  105 104  CO2 23 25 24   --   --  25 22  GLUCOSE 107* 121* 114* 103*  --  228* 163*  BUN 16 12 16 15   --  14 12  CREATININE 0.59 0.74 0.76 0.70  --  0.64 0.58  CALCIUM 7.3* 8.0* 7.3*  --   --  7.2* 6.7*  MG  --  1.8 1.8  --   --   --  1.6*  PHOS  --  2.6 4.4  --   --   --   --    GFR: Estimated Creatinine  Clearance: 32.4 mL/min (by C-G formula based on SCr of 0.58 mg/dL). Recent Labs  Lab 01/14/21 0519 01/16/21 0539 01/16/21 1547 01/17/21 0457  WBC 17.0* 13.1* 11.8* 15.0*    Liver Function Tests: Recent Labs  Lab 01/11/21 2015 01/13/21 0347 01/14/21 0519 01/16/21 0539 01/17/21 0457  AST 19 18 15 19 24   ALT 15 11 10 13 14   ALKPHOS 43 39 38 39 33*  BILITOT 0.4 0.8 1.0 0.7 1.7*  PROT 5.4* 4.7* 4.0* 4.6* 3.8*  ALBUMIN 3.0* 2.6* 2.1* 2.0* 1.7*   No results for input(s): LIPASE, AMYLASE in the last 168 hours. No results for input(s): AMMONIA in the last 168 hours.  ABG    Component Value Date/Time   PHART 7.262 (L) 01/15/2021 1316   PCO2ART 50.7 (H) 01/15/2021 1316   PO2ART 101 01/15/2021 1316   HCO3 22.8 01/15/2021 1316   TCO2 24 01/15/2021 1316   ACIDBASEDEF 4.0 (H) 01/15/2021 1316   O2SAT 97.0 01/15/2021 1316     Coagulation Profile: Recent Labs  Lab 01/16/21 2259  INR 1.4*    Cardiac Enzymes: No results for input(s): CKTOTAL, CKMB, CKMBINDEX, TROPONINI in the last 168 hours.  HbA1C: Hgb A1c MFr Bld  Date/Time Value Ref Range Status  01/14/2021 09:05 AM 7.4 (H) 4.8 - 5.6 % Final    Comment:    (NOTE) Pre diabetes:          5.7%-6.4%  Diabetes:              >6.4%  Glycemic control for   <7.0% adults with diabetes     CBG: Recent Labs  Lab 01/16/21 0748 01/16/21 1114 01/16/21 1553 01/16/21 2121 01/17/21 1226  GLUCAP 174* 125* 125* 136* 151*    Review of Systems:   No CP, orthopnea, or PND. Mild abdominal pain. Comprehensive ROS otherwise negative.   Past Medical History:  She,  has a past medical history of Anemia, Chronic airway obstruction, not elsewhere classified, COPD (chronic obstructive pulmonary disease) ([place]), Diverticulosis, GI hemorrhage from post-treatment cecal ulcer (08/18/2012), Hemorrhoids, HTN (hypertension), Hypercholesteremia, and Lower GI bleed (multiple).   Surgical History:   Past Surgical History:  Procedure  Laterality Date  . BUNIONECTOMY     left  . COLECTOMY    . COLONOSCOPY  08/10/2012   Procedure: COLONOSCOPY;  Surgeon: Ladene Artist, MD,FACG;  Location: [facility];  Service: Endoscopy;  Laterality: N/A;  . COLONOSCOPY  08/19/2012   Procedure: COLONOSCOPY;  Surgeon: Gatha Mayer, MD;  Location: [facility];  Service: Endoscopy;  Laterality: N/A;  . COLONOSCOPY N/A 03/11/2013   Procedure: COLONOSCOPY;  Surgeon: Irene Shipper, MD;  Location: [facility];  Service: Endoscopy;  Laterality: N/A;  . COLONOSCOPY WITH PROPOFOL N/A 01/10/2021   Procedure: COLONOSCOPY WITH PROPOFOL;  Surgeon: Irene Shipper, MD;  Location: [facility];  Service: Endoscopy;  Laterality: N/A;  . COLOSTOMY N/A 01/15/2021   Procedure: COLOSTOMY;  Surgeon: Erroll Luna, MD;  Location: [facility];  Service: General;  Laterality: N/A;  . ESOPHAGOGASTRODUODENOSCOPY (EGD) WITH PROPOFOL N/A 01/14/2021   Procedure: ESOPHAGOGASTRODUODENOSCOPY (EGD) WITH PROPOFOL;  Surgeon: Doran Stabler, MD;  Location: [facility];  Service: Endoscopy;  Laterality: N/A;  . FLEXIBLE SIGMOIDOSCOPY N/A 01/14/2021   Procedure: FLEXIBLE SIGMOIDOSCOPY;  Surgeon: Doran Stabler, MD;  Location: [facility];  Service: Endoscopy;  Laterality: N/A;  . HOT HEMOSTASIS N/A 01/10/2021   Procedure: HOT HEMOSTASIS (ARGON PLASMA COAGULATION/BICAP);  Surgeon: Irene Shipper, MD;  Location: [facility];  Service: Endoscopy;  Laterality: N/A;  . IR ANGIOGRAM SELECTIVE EACH ADDITIONAL VESSEL  01/12/2021  . IR ANGIOGRAM SELECTIVE EACH ADDITIONAL VESSEL  01/12/2021  . IR ANGIOGRAM SELECTIVE EACH ADDITIONAL VESSEL  01/12/2021  . IR ANGIOGRAM VISCERAL SELECTIVE  01/09/2021  . IR ANGIOGRAM VISCERAL SELECTIVE  01/12/2021  . IR EMBO ARTERIAL NOT HEMORR HEMANG INC GUIDE ROADMAPPING  01/12/2021  . IR US GUIDE VASC ACCESS RIGHT  01/09/2021  . IR US GUIDE VASC ACCESS RIGHT  01/12/2021  . PARTIAL COLECTOMY N/A 01/15/2021   Procedure: PARTIAL COLECTOMY;  Surgeon: Erroll Luna,  MD;  Location: [facility];  Service: General;  Laterality: N/A;  . POLYPECTOMY  01/10/2021   Procedure: POLYPECTOMY;  Surgeon: Irene Shipper, MD;  Location: [facility];  Service: Endoscopy;;  . TONSILLECTOMY       Social History:   reports that she has been smoking cigarettes. She has a 59.00 pack-year smoking history. She has never used smokeless tobacco. She reports that she does not drink alcohol and does not use drugs.   Family History:  Her family history includes Anuerysm in her brother; Breast cancer in her mother and sister; Lung cancer in her father. There is no history of Colon cancer, Esophageal cancer, or Rectal cancer.   Allergies No Known Allergies   Home Medications  Prior to Admission medications   Medication Sig Start Date End Date Taking? Authorizing Provider  aspirin EC 81 MG tablet Take 1 tablet (81 mg total) by mouth every other day. 01/17/21  Yes Nita Sells, MD  budesonide (ENTOCORT EC) 3 MG 24 hr capsule Take 9mg  for one month; then 6 mg for one month; then 3 mg for one month; then stop Patient taking differently: Take 6 mg by mouth daily. Take 9mg  for one month; then 6 mg for one month; then 3 mg for one month; then stop 03/12/20  Yes Irene Shipper, MD  calcium carbonate (OS-CAL) 600 MG TABS Take 600 mg by mouth daily.   Yes [provider]  ECHINACEA PO Take 1 capsule by mouth daily.   Yes [provider]  ferrous sulfate 325 (65 FE) MG EC tablet Take 325 mg by mouth 2 (two) times daily.    Yes [provider]  losartan (COZAAR) 50 MG tablet Take 50 mg by mouth daily.   Yes [provider]  pravastatin (PRAVACHOL) 10 MG tablet Take 10 mg by mouth daily.   Yes [provider]  pantoprazole (PROTONIX) 40 MG tablet Take 1 tablet (40 mg total) by mouth 2 (two) times daily. 01/10/21 03/11/21  Nita Sells, MD     Critical care time:    n/a

## 2021-01-17 NOTE — Progress Notes (Signed)
1 Day Post-Op   Subjective/Chief Complaint: Pt doing well this AM Been stable overnight after 2 of 2U of PRBCs    Objective: Vital signs in last 24 hours: Temp:  [97.7 F (36.5 C)-98.8 F (37.1 C)] 97.7 F (36.5 C) (03/05 0245) Pulse Rate:  [76-112] 94 (03/05 0500) Resp:  [10-21] 15 (03/05 0500) BP: (71-205)/(37-102) 144/66 (03/05 0500) SpO2:  [88 %-100 %] 94 % (03/05 0500) Arterial Line BP: (56-145)/(48-59) 56/52 (03/04 1500) Last BM Date: 01/16/21  Intake/Output from previous day: 03/04 0701 - 03/05 0700 In: 4298.1 [I.V.:1926; Blood:310; IV Piggyback:2062.1] Out: 1100 [Urine:750; Emesis/NG output:100; Stool:250] Intake/Output this shift: Total I/O In: 1396 [I.V.:695.2; Blood:310; IV Piggyback:390.8] Out: 300 [Urine:300]  General appearance: alert and cooperative GI: soft, non-tender; bowel sounds normal; no masses,  no organomegaly and inc c/d/i, ostomy pink and patent  Lab Results:  Recent Labs    01/16/21 1547 01/16/21 2259 01/17/21 0457  WBC 11.8*  --  15.0*  HGB 7.2* 8.4* 9.7*  HCT 21.9* 24.8* 28.9*  PLT 274  --  214   BMET Recent Labs    01/16/21 0539 01/17/21 0457  NA 135 132*  K 3.6 3.4*  CL 105 104  CO2 25 22  GLUCOSE 228* 163*  BUN 14 12  CREATININE 0.64 0.58  CALCIUM 7.2* 6.7*   PT/INR Recent Labs    01/16/21 2259  LABPROT 17.1*  INR 1.4*   ABG Recent Labs    01/15/21 1316  PHART 7.262*  HCO3 22.8    Studies/Results: DG CHEST PORT 1 VIEW  Result Date: 01/16/2021 CLINICAL DATA:  Central catheter placement EXAM: PORTABLE CHEST 1 VIEW COMPARISON:  CT angio abdomen and pelvis 01/11/2021 FINDINGS: Left subclavian approach central venous catheter tip terminates in the mid SVC. Telemetry leads and external support devices overlie the chest. Low lung volumes and atelectasis. Layering bilateral effusions. Some patchy and streaky opacities are present throughout both lungs, right greater than left. No visible pneumothorax. The aorta is  calcified. The remaining cardiomediastinal contours are unremarkable. No acute osseous or soft tissue abnormality. IMPRESSION: Left subclavian approach central venous catheter tip terminates in the mid SVC. Developing bilateral effusions. Heterogeneous opacities in the lungs, could reflect developing infection and/or edema. Electronically Signed   By: Lovena Le M.D.   On: 01/16/2021 21:10   ECHOCARDIOGRAM COMPLETE  Result Date: 01/15/2021    ECHOCARDIOGRAM REPORT   Patient Name:   Kathleen Pope Date of Exam: 01/15/2021 Medical Rec #:  [id number]    Height:       61.5 in Accession #:    [id number]   Weight:       88.0 lb Date of Birth:  September 18, 1933     BSA:          1.340 m Patient Age:    87 years     BP:           127/78 mmHg Patient Gender: F            HR:           83 bpm. Exam Location:  Inpatient Procedure: 2D Echo, Color Doppler, Cardiac Doppler and 3D Echo Indications:    Other Cardiac Sounds R01.2; Z01.818 Encounter for other                 preprocedural examination  History:        Patient has prior history of Echocardiogram examinations, most  recent 08/07/2012. COPD; Risk Factors:Hypertension and                 Dyslipidemia.  Sonographer:    Jonelle Sidle Dance Referring Phys: [id number] ELIZABETH [place]  1. There is severe aortic stenosis with AVA 0.84cm2, mean gradient 54 mmHg, peak gradient 55mmHg, Vmax 4.71m/s, DI 0.27.  2. The aortic valve is calcified. There is severe calcifcation of the aortic valve. There is severe thickening of the aortic valve. Aortic valve regurgitation is mild.  3. Left ventricular ejection fraction, by estimation, is 60 to 65%. The left ventricle has normal function. The left ventricle has no regional wall motion abnormalities.  4. There is severe hypertrophy of the basal septum measuring 1.8cm. The rest of the LV segments demonstrate moderate hypertrophy. An intracavitary gradient is present with no evidence of SAM.  5. Left ventricular diastolic  parameters are consistent with Grade II diastolic dysfunction (pseudonormalization).  6. Elevated left atrial pressure.  7. Right ventricular systolic function is normal. The right ventricular size is normal.  8. Left atrial size was moderately dilated.  9. There is moderate thickening of the mitral valve leaflet(s). There is mild calcification of the mitral valve leaflet(s). Mild mitral annular calcification. Mild mitral valve regurgitation. 10. The inferior vena cava is dilated in size with >50% respiratory variability, suggesting right atrial pressure of 8 mmHg. Comparison(s): Compared to echo report in 2013, there is now severe aortic stenosis. FINDINGS  Left Ventricle: Left ventricular ejection fraction, by estimation, is 65 to 70%. The left ventricle has normal function. The left ventricle has no regional wall motion abnormalities. The left ventricular internal cavity size was normal in size. There is  servere hypertrophy of the basal septal segments. The rest of the LV segments demonstrate moderate hypertrophy. Left ventricular diastolic parameters are consistent with Grade II diastolic dysfunction (pseudonormalization). Elevated left atrial pressure. The E/e' is 33. Right Ventricle: The right ventricular size is normal. No increase in right ventricular wall thickness. Right ventricular systolic function is normal. Left Atrium: Left atrial size was moderately dilated. Right Atrium: Right atrial size was normal in size. Pericardium: Trivial pericardial effusion is present. Mitral Valve: The mitral valve is abnormal. There is moderate thickening of the mitral valve leaflet(s). There is mild calcification of the mitral valve leaflet(s). Mild mitral annular calcification. Mild mitral valve regurgitation. Tricuspid Valve: The tricuspid valve is normal in structure. Tricuspid valve regurgitation is trivial. Aortic Valve: There is severe aortic stenosis with AVA 0.84cm2, mean gradient 54 mmHg, peak gradient 69mmHg,  Vmax 4.8m/s, DI 0.27. The aortic valve is calcified. There is severe calcifcation of the aortic valve. There is severe thickening of the aortic valve. Aortic valve regurgitation is mild. Aortic regurgitation PHT measures 373 msec. Pulmonic Valve: The pulmonic valve was normal in structure. Pulmonic valve regurgitation is trivial. Aorta: The aortic root and ascending aorta are structurally normal, with no evidence of dilitation. Venous: The inferior vena cava is dilated in size with greater than 50% respiratory variability, suggesting right atrial pressure of 8 mmHg. IAS/Shunts: No atrial level shunt detected by color flow Doppler.  LEFT VENTRICLE PLAX 2D LVIDd:         3.30 cm  Diastology LVIDs:         2.10 cm  LV e' medial:    5.66 cm/s LV PW:         1.30 cm  LV E/e' medial:  28.6 LV IVS:        1.20 cm  LV e' lateral:   4.35 cm/s LVOT diam:     2.00 cm  LV E/e' lateral: 37.2 LV SV:         83 LV SV Index:   62 LVOT Area:     3.14 cm  RIGHT VENTRICLE             IVC RV Basal diam:  2.30 cm     IVC diam: 2.30 cm RV S prime:     14.10 cm/s TAPSE (M-mode): 1.6 cm LEFT ATRIUM             Index       RIGHT ATRIUM          Index LA diam:        3.70 cm 2.76 cm/m  RA Area:     9.22 cm LA Vol (A2C):   36.5 ml 27.23 ml/m RA Volume:   16.60 ml 12.39 ml/m LA Vol (A4C):   56.0 ml 41.78 ml/m LA Biplane Vol: 46.2 ml 34.47 ml/m  AORTIC VALVE AV Area (Vmax):    0.84 cm AV Area (Vmean):   0.86 cm AV Area (VTI):     0.93 cm AV Vmax:           424.00 cm/s AV Vmean:          307.200 cm/s AV VTI:            0.885 m AV Peak Grad:      71.9 mmHg AV Mean Grad:      43.6 mmHg LVOT Vmax:         114.00 cm/s LVOT Vmean:        84.300 cm/s LVOT VTI:          0.263 m LVOT/AV VTI ratio: 0.30 AI PHT:            373 msec  AORTA Ao Root diam: 3.20 cm Ao Asc diam:  3.10 cm MITRAL VALVE MV Area (PHT): 2.54 cm     SHUNTS MV Decel Time: 299 msec     Systemic VTI:  0.26 m MV E velocity: 162.00 cm/s  Systemic Diam: 2.00 cm MV A velocity:  145.00 cm/s MV E/A ratio:  1.12 Gwyndolyn Kaufman MD Electronically signed by Gwyndolyn Kaufman MD Signature Date/Time: 01/15/2021/10:43:34 AM    Final     Anti-infectives: Anti-infectives (From admission, onward)   Start     Dose/Rate Route Frequency Ordered Stop   01/15/21 1800  ciprofloxacin (CIPRO) IVPB 400 mg        400 mg 200 mL/hr over 60 Minutes Intravenous Every 12 hours 01/15/21 1609     01/15/21 1700  metroNIDAZOLE (FLAGYL) IVPB 500 mg        500 mg 100 mL/hr over 60 Minutes Intravenous Every 8 hours 01/15/21 1609     01/15/21 0800  cefoTEtan (CEFOTAN) 2 g in sodium chloride 0.9 % 100 mL IVPB        2 g 200 mL/hr over 30 Minutes Intravenous On call to O.R. 01/15/21 0705 01/15/21 1145      Assessment/Plan: COPD HTN  POD 2, s/p ex lap with partial colectomy (L) and end colostomy, Dr. Brantley Stage 01/15/21 -Per c-scope last night, pt with con't bleeding seen.  Will plan on completion colectomy this AM. -I d/w the pt and he daughter [name] the risks and benefits of the procedure to include, but not limited to: bleeding, infection, damage to surrounding structure, possible need for further surgery, death, heart attach, stroke.  Pt voiced understanding and wishes to proceed FEN - NPO/NGT/IVFs VTE - on hold due to bleeding issues ID - C/F   LOS: 6 days    Ralene Ok 01/17/2021

## 2021-01-18 ENCOUNTER — Encounter (HOSPITAL_COMMUNITY): Payer: Self-pay | Admitting: General Surgery

## 2021-01-18 DIAGNOSIS — K922 Gastrointestinal hemorrhage, unspecified: Secondary | ICD-10-CM | POA: Diagnosis not present

## 2021-01-18 DIAGNOSIS — D62 Acute posthemorrhagic anemia: Secondary | ICD-10-CM | POA: Diagnosis not present

## 2021-01-18 DIAGNOSIS — K5520 Angiodysplasia of colon without hemorrhage: Secondary | ICD-10-CM | POA: Diagnosis not present

## 2021-01-18 DIAGNOSIS — I10 Essential (primary) hypertension: Secondary | ICD-10-CM | POA: Diagnosis not present

## 2021-01-18 LAB — TYPE AND SCREEN
ABO/RH(D): B POS
Antibody Screen: NEGATIVE
Unit division: 0
Unit division: 0
Unit division: 0

## 2021-01-18 LAB — BPAM RBC
Blood Product Expiration Date: 202203282359
Blood Product Expiration Date: 202203302359
Blood Product Expiration Date: 202203302359
ISSUE DATE / TIME: 202203031006
ISSUE DATE / TIME: 202203042042
ISSUE DATE / TIME: 202203050128
Unit Type and Rh: 7300
Unit Type and Rh: 7300
Unit Type and Rh: 7300

## 2021-01-18 LAB — GLUCOSE, CAPILLARY
Glucose-Capillary: 121 mg/dL — ABNORMAL HIGH (ref 70–99)
Glucose-Capillary: 155 mg/dL — ABNORMAL HIGH (ref 70–99)
Glucose-Capillary: 101 mg/dL — ABNORMAL HIGH (ref 70–99)
Glucose-Capillary: 170 mg/dL — ABNORMAL HIGH (ref 70–99)

## 2021-01-18 LAB — BASIC METABOLIC PANEL
Anion gap: 6 (ref 5–15)
BUN: 13 mg/dL (ref 8–23)
CO2: 25 mmol/L (ref 22–32)
Calcium: 6.9 mg/dL — ABNORMAL LOW (ref 8.9–10.3)
Chloride: 102 mmol/L (ref 98–111)
Creatinine, Ser: 0.54 mg/dL (ref 0.44–1.00)
GFR, Estimated: >60 mL/min (ref >60)
Glucose, Bld: 102 mg/dL — ABNORMAL HIGH (ref 70–99)
Potassium: 4.6 mmol/L (ref 3.5–5.1)
Sodium: 133 mmol/L — ABNORMAL LOW (ref 135–145)

## 2021-01-18 LAB — CBC
HCT: 26.9 % — ABNORMAL LOW (ref 36.0–46.0)
Hemoglobin: 9.1 g/dL — ABNORMAL LOW (ref 12.0–15.0)
MCH: 29.7 pg (ref 26.0–34.0)
MCHC: 33.8 g/dL (ref 30.0–36.0)
MCV: 87.9 fL (ref 80.0–100.0)
Platelets: 290 10*3/uL (ref 150–400)
RBC: 3.06 MIL/uL — ABNORMAL LOW (ref 3.87–5.11)
RDW: 16.6 % — ABNORMAL HIGH (ref 11.5–15.5)
WBC: 16.9 10*3/uL — ABNORMAL HIGH (ref 4.0–10.5)
nRBC: 0 % (ref 0.0–0.2)

## 2021-01-18 LAB — MAGNESIUM: Magnesium: 1.9 mg/dL (ref 1.7–2.4)

## 2021-01-18 MED ORDER — FUROSEMIDE 10 MG/ML IJ SOLN
40.0000 mg | Freq: Two times a day (BID) | INTRAMUSCULAR | Status: DC
  Administered 2021-01-18 – 2021-01-21 (×8): 40 mg via INTRAVENOUS
  Filled 2021-01-18 (×8): qty 4

## 2021-01-18 NOTE — Evaluation (Signed)
Occupational Therapy Re-evaluation Patient Details Name: Kathleen Pope MRN: [id number] DOB: [birth year] Today's Date: 01/18/2021    History of Present Illness Kathleen Pope is a 87 y.o. 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 Pt now s/p partial colectomy on 3/3   Clinical Impression   Patient seen for re-evaluation s/p abdominal sx listed above, original eval on 3/1 pre-surgery. At baseline patient is mod I with ADLs and has DTR that lives next door that checks in daily. Currently patient with increased pain impacting her overall activity tolerance and strength requiring increased assist with functional transfers and ADLs. Pt instructed in log roll needing multiple cues for technique and min A to complete. Pt min A for stability to stand pivot to recliner, set up-supervision for UB ADL and mod to max A for LB ADLs. Patient will benefit from continued acute OT services for ADL re-training, build activity tolerance and overall balance in order to maximize I with self care. Hopeful that patient will progress well and be able to D/C with Kathleen Pope, however due to current level of assist recommending ST rehab and will continue to follow for progress.      Follow Up Recommendations  SNF;Other (comment) (vs HH with initial 24/7 pending progress)    Equipment Recommendations  None recommended by OT       Precautions / Restrictions Precautions Precautions: Fall Precaution Comments: monitor vitals, recent abd sx Restrictions Weight Bearing Restrictions: No      Mobility Bed Mobility Overal bed mobility: Needs Assistance Bed Mobility: Rolling;Sidelying to Sit Rolling: Min guard Sidelying to sit: Min assist;HOB elevated       General bed mobility comments: cued for log roll  to reduce pain in abdomen, needed multiple cues for technique and  min A to elevate trunk from sidelying    Transfers Overall transfer level: Needs assistance Equipment used: None Transfers: Stand Pivot Transfers   Stand pivot transfers: Min assist       General transfer comment: min A for steadying    Balance Overall balance assessment: Needs assistance Sitting-balance support: Feet supported Sitting balance-Leahy Scale: Fair     Standing balance support: During functional activity Standing balance-Leahy Scale: Poor Standing balance comment: min A                           ADL either performed or assessed with clinical judgement   ADL Overall ADL's : Needs assistance/impaired Eating/Feeding: Independent;Sitting Eating/Feeding Details (indicate cue type and reason): pt able to drink from cup with straw Grooming: Set up;Sitting   Upper Body Bathing: Supervision/ safety;Set up;Sitting   Lower Body Bathing: Moderate assistance;Sitting/lateral leans;Sit to/from stand   Upper Body Dressing : Supervision/safety;Set up;Sitting   Lower Body Dressing: Maximal assistance;Sitting/lateral leans;Sit to/from stand Lower Body Dressing Details (indicate cue type and reason): 2* increased abdominal pain s/p sx, decreased activity tolerance Toilet Transfer: Minimal assistance;Stand-pivot;Cueing for sequencing Toilet Transfer Details (indicate cue type and reason): to recliner, min A for stability pt able to support her weight through LEs. does report mild dizziness and nausea RN made aware Toileting- Clothing Manipulation and Hygiene: Moderate assistance;Sitting/lateral lean;Sit to/from stand       Functional mobility during ADLs: Minimal assistance General ADL Comments: patient requiring increased assistance with self care due to increased pain s/p abdominal sx, decreased activity  tolerance and standing balance                  Pertinent Vitals/Pain Pain Assessment: Faces Faces Pain Scale: Hurts even more Pain Location:  abdomen Pain Descriptors / Indicators: Grimacing;Guarding;Sore;Discomfort Pain Intervention(s): Monitored during session     Hand Dominance Right   Extremity/Trunk Assessment Upper Extremity Assessment Upper Extremity Assessment: Generalized weakness   Lower Extremity Assessment Lower Extremity Assessment: Defer to PT evaluation   Cervical / Trunk Assessment Cervical / Trunk Assessment: Normal   Communication Communication Communication: No difficulties   Cognition Arousal/Alertness: Awake/alert Behavior During Therapy: WFL for tasks assessed/performed Overall Cognitive Status: Within Functional Limits for tasks assessed                                     General Comments  HR up to 119 with transfer, O2 stable on 2L, BP post transfer 142/65            Home Living Family/patient expects to be discharged to:: Private residence Living Arrangements: Alone Available Help at Discharge: Family;Available 24 hours/day Type of Home: House Home Access: Stairs to enter [employer] of Steps: 1 Entrance Stairs-Rails: None Home Layout: One level     Bathroom Shower/Tub: Occupational psychologist: Standard     Home Equipment: Marine scientist - single point          Prior Functioning/Environment Level of Independence: Independent        Comments: Pt reports being independent with ambulation and ADLs, no AD use, denies falls, retired daughter lives next door and calls pt multiple times a day.        OT Problem List: Decreased strength;Decreased activity tolerance;Impaired balance (sitting and/or standing);Decreased safety awareness;Decreased knowledge of precautions;Pain      OT Treatment/Interventions: Self-care/ADL training;Therapeutic exercise;Therapeutic activities;Patient/family education;Balance training;DME and/or AE instruction    OT Goals(Current goals can be found in the care plan section) Acute Rehab OT Goals Patient  Stated Goal: less nausea OT Goal Formulation: With patient Time For Goal Achievement: 02/01/21 Potential to Achieve Goals: Good  OT Frequency: Min 2X/week    AM-PAC OT "6 Clicks" Daily Activity     Outcome Measure Help from another person eating meals?: None Help from another person taking care of personal grooming?: A Little Help from another person toileting, which includes using toliet, bedpan, or urinal?: A Lot Help from another person bathing (including washing, rinsing, drying)?: A Lot Help from another person to put on and taking off regular upper body clothing?: A Little Help from another person to put on and taking off regular lower body clothing?: A Lot 6 Click Score: 16   End of Session Equipment Utilized During Treatment: Oxygen Nurse Communication: Mobility status  Activity Tolerance: Patient tolerated treatment well Patient left: in chair;with call bell/phone within reach;with chair alarm set  OT Visit Diagnosis: Pain;Other abnormalities of gait and mobility (R26.89);Muscle weakness (generalized) (M62.81) Pain - part of body:  (abdomen)                Time: 8325-4982 OT Time Calculation (min): 31 min Charges:  OT General Charges $OT Visit: 1 Visit OT Evaluation $OT Re-eval: 1 Re-eval OT Treatments $Self Care/Home Management : 8-22 mins  Delbert Phenix OT OT pager: [place] 01/18/2021, 9:23 AM

## 2021-01-18 NOTE — Evaluation (Signed)
Physical Therapy Evaluation Patient Details Name: Kathleen Pope MRN: [id number] DOB: 1933-06-04 Today's Date: 01/18/2021   History of Present Illness  Kathleen Pope is a 87 y.o. 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 Pt now s/p partial colectomy on 3/3 and Exp Lap with completion of colectormy 01/17/21.  Clinical Impression  Patient seen for re-evaluation s/p abdominal sx listed above, original eval on 01/12/21 pre-surgery. Pt presents post op with functional mobility limitations 2* generalized weakness, ambulatory balance deficits and post op pain.  Pt should progress to dc home with family assist.     Follow Up Recommendations Home health PT (dependent on acute stay progress)    Equipment Recommendations  Other (comment) (TBD)    Recommendations for Other Services       Precautions / Restrictions Precautions Precautions: Fall Precaution Comments: monitor vitals, recent abd sx Restrictions Weight Bearing Restrictions: No      Mobility  Bed Mobility Overal bed mobility: Needs Assistance Bed Mobility: Sit to Sidelying;Rolling Rolling: Min guard Sidelying to sit: Min assist;HOB elevated     Sit to sidelying: Min assist;Mod assist General bed mobility comments: assist with LEs onto bed; cues for log roll    Transfers Overall transfer level: Needs assistance Equipment used: Rolling walker (2 wheeled) Transfers: Stand Pivot Transfers Sit to Stand: Min guard Stand pivot transfers: Min assist       General transfer comment: min A for steadying  Ambulation/Gait Ambulation/Gait assistance: Min guard Gait Distance (Feet): 5 Feet Assistive device: Rolling walker (2 wheeled) Gait Pattern/deviations: Decreased stride length;Step-to pattern;Shuffle Gait velocity: decreased   General Gait Details: cues for posture,  position from RW and saftey awareness with multiple lines  Stairs            Wheelchair Mobility    Modified Rankin (Stroke Patients Only)       Balance Overall balance assessment: Needs assistance Sitting-balance support: Feet supported Sitting balance-Leahy Scale: Fair     Standing balance support: During functional activity;Bilateral upper extremity supported Standing balance-Leahy Scale: Poor Standing balance comment: min A                             Pertinent Vitals/Pain Pain Assessment: Faces Faces Pain Scale: Hurts even more Pain Location: abdomen Pain Descriptors / Indicators: Grimacing;Guarding;Sore;Discomfort Pain Intervention(s): Limited activity within patient's tolerance;Monitored during session;Patient requesting pain meds-RN notified    Home Living Family/patient expects to be discharged to:: Private residence Living Arrangements: Alone Available Help at Discharge: Family;Available 24 hours/day Type of Home: House Home Access: Stairs to enter Entrance Stairs-Rails: None Entrance Stairs-Number of Steps: 1 Home Layout: One level Home Equipment: Shower seat;Cane - single point      Prior Function Level of Independence: Independent         Comments: Pt reports being independent with ambulation and ADLs, no AD use, denies falls, retired daughter lives next door and calls pt multiple times a day.     Hand Dominance   Dominant Hand: Right    Extremity/Trunk Assessment   Upper Extremity Assessment Upper Extremity Assessment: Generalized weakness    Lower Extremity Assessment Lower Extremity Assessment: Generalized weakness    Cervical / Trunk Assessment Cervical / Trunk Assessment: Normal  Communication   Communication: No difficulties  Cognition Arousal/Alertness: Awake/alert Behavior During Therapy: [facility]  for tasks assessed/performed Overall Cognitive Status: Within Functional Limits for tasks assessed                                         General Comments General comments (skin integrity, edema, etc.): HR up to 119 with transfer, O2 stable on 2L, BP post transfer 142/65    Exercises     Assessment/Plan    PT Assessment Patient needs continued PT services  PT Problem List Decreased strength;Decreased activity tolerance;Decreased balance;Decreased mobility;Decreased knowledge of use of DME;Pain       PT Treatment Interventions DME instruction;Gait training;Functional mobility training;Stair training;Therapeutic exercise;Therapeutic activities;Patient/family education    PT Goals (Current goals can be found in the Care Plan section)  Acute Rehab PT Goals Patient Stated Goal: REgain IND PT Goal Formulation: With patient Time For Goal Achievement: 01/26/21 Potential to Achieve Goals: Good    Frequency Min 3X/week   Barriers to discharge        Co-evaluation               AM-PAC PT "6 Clicks" Mobility  Outcome Measure Help needed turning from your back to your side while in a flat bed without using bedrails?: A Little Help needed moving from lying on your back to sitting on the side of a flat bed without using bedrails?: A Little Help needed moving to and from a bed to a chair (including a wheelchair)?: A Little Help needed standing up from a chair using your arms (e.g., wheelchair or bedside chair)?: A Little Help needed to walk in hospital room?: A Little Help needed climbing 3-5 steps with a railing? : A Lot 6 Click Score: 17    End of Session Equipment Utilized During Treatment: Oxygen Activity Tolerance: Patient tolerated treatment well;Patient limited by fatigue Patient left: in bed;with call bell/phone within reach;with nursing/sitter in room Nurse Communication: Mobility status;Other (comment) PT Visit Diagnosis: Difficulty in walking, not elsewhere classified (R26.2);Unsteadiness on feet (R26.81)    Time: 1050-1110 PT Time Calculation (min) (ACUTE ONLY):  20 min   Charges:   PT Evaluation $PT Re-evaluation: 1 Re-eval          [place] Pager [phone] Office [phone]   Kathleen Pope 01/18/2021, 11:52 AM

## 2021-01-18 NOTE — Progress Notes (Signed)
PROGRESS NOTE    MARGARITA CROKE  FBP:[id number] DOB: [birth year] DOA: 01/11/2021 PCP: Redmond School, MD   Brief Narrative: Kathleen Pope is a 87 y.o. 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 Patient presented secondary recurrent GI bleeding. Patient underwent coil embolization which unfortunately appears to have left to ischemic colon requiring partial colectomy. After partial colectomy, patient developed hypotension and brisk GI bleeding requiring total colectomy.    Assessment & Plan:   Principal Problem:   Rectal bleed Active Problems:   Chronic lower GI bleeding   AVM (arteriovenous malformation) of colon   HTN (hypertension)   Acute blood loss anemia   GI bleed   Lower GI bleeding Recurrent diverticular bleed Patient presented with recurrent lower GI bleeding. Recently admitted with a similar presentation. On this admission, she is s/p mesenteric arteriogram with successful coil embolization of left colic artery on 8/24. Recurrent large volume episode of hematochezia overnight on 3/1. GI re-consulted and performed flexible sigmoidoscopy on 3/2 which was significant for colonic ischemia. After partial colectomy, she had recurrent GI bleeding with attempted bedside colonoscopy under conscious sedation. Colonoscopy unsuccessful in identifying lesion secondary to copious blood and recommendation for total colectomy was made which was performed on 3/5.  Colonic ischemia Seen on endoscopy. General surgery consulted and performed ex lap with partial colectomy and end colostomy on 3/3. NG tube placed post-op then removed. Patient underwent total colectomy with end ileostomy on 3/5. -General surgery recommendations: Clear liquid diet  Acute blood loss anemia Secondary to above. Patient has received 7 units of PRBC for GI bleeding in addition to peri-op transfusions. Hemoglobin appears to be  stable.  Hypovolemic shock Secondary to blood loss from GI bleeding. Patient required aggressive fluid/blood resuscitation in addition to short management with vasopressor support. Now resolved.  Acute respiratory failure with hypoxia Fluid overload Possibly secondary to fluid overload. Chest x-ray confirmed. Patient is net + 10 L with a 15 kg weight gain -Lasix 40 mg BID; will need to be mindful of severe AS and not over-diurese -Daily BMP -Wean oxygen to room air  Severe aortic stenosis Seen on Transthoracic Echocardiogram. Will need outpatient cardiology follow-up.  Hyperglycemia Hemoglobin A1C of 7.4%. Would benefit from metformin on discharge if appropriate. -SSI  COPD Patient is a current smoker. COPD stable.  Primary hypertension Patient is on losartan as an outpatient which was held secondary to hypotension from acute blood loss. -Continue to hold losartan  Collagenous colitis Patient is on budesonide as an outpatient. Currently takes varying doses but most recently was taking 12 mg daily. Currently on prescribed dose of 6 mg daily -Continue budesonide 6 mg daily  Leukocytosis Possibly related to budesonide, but it seems more so secondary to ischemia. Stable.  Hyperlipidemia -Continue Pravastatin  Underweight Weight is currently stable. Down about 10 lbs  from several years ago. -Dietician consulted   DVT prophylaxis: SCDs secondary to GI bleeding Code Status:   Code Status: DNR Family Communication: None at bedside Disposition Plan: Discharge home pending general surgery recommendations   Consultants:   Gastroenterology Velora Heckler)  General surgery  Procedures:   POST MESENTERIC ARTERIOGRAM AND PERCUTANEOUS EMBOLIZATION (01/12/2021)  EGD/FLEXIBLE SIGMOIDOSCOPY (01/15/2021)  PARTIAL COLECTOMY (01/15/2021)  Antimicrobials:  Ciprofloxacin  Flagyl   Subjective: Abdominal pain. No other concerns.  Objective: Vitals:   01/18/21 0400 01/18/21 0500  01/18/21 0800 01/18/21 0829  BP: (!) 134/59 (!) 112/51 139/69   Pulse: 93 86 (!)  106   Resp: 11 (!) 9 16   Temp:    98.9 F (37.2 C)  TempSrc:    Oral  SpO2: 96% 96% 96%   Weight:  55 kg    Height:        Intake/Output Summary (Last 24 hours) at 01/18/2021 0846 Last data filed at 01/18/2021 0600 Gross per 24 hour  Intake 2456.48 ml  Output 2760 ml  Net -303.52 ml   Filed Weights   01/14/21 1533 01/17/21 1632 01/18/21 0500  Weight: 39.9 kg 51.6 kg 55 kg    Examination:  General exam: Appears calm and comfortable Respiratory system: Diminished. Respiratory effort normal. Cardiovascular system: S1 & S2 heard, RRR. No murmurs, rubs, gallops or clicks. Gastrointestinal system: Abdomen is nondistended, soft and diffusely tender. No organomegaly or masses felt. Decreased bowel sounds heard. Central nervous system: Alert and oriented. No focal neurological deficits. Musculoskeletal: Significant pitting edema noted of left arm/elbow. No calf tenderness Skin: No cyanosis. No rashes Psychiatry: Judgement and insight appear normal. Mood & affect appropriate.     Data Reviewed: I have personally reviewed following labs and imaging studies  CBC Lab Results  Component Value Date   WBC 16.9 (H) 01/18/2021   RBC 3.06 (L) 01/18/2021   HGB 9.1 (L) 01/18/2021   HCT 26.9 (L) 01/18/2021   MCV 87.9 01/18/2021   MCH 29.7 01/18/2021   PLT 290 01/18/2021   MCHC 33.8 01/18/2021   RDW 16.6 (H) 01/18/2021   LYMPHSABS 1.4 01/14/2021   MONOABS 2.1 (H) 01/14/2021   EOSABS 0.1 01/14/2021   BASOSABS 0.0 67/10/4579     Last metabolic panel Lab Results  Component Value Date   NA 133 (L) 01/18/2021   K 4.6 01/18/2021   CL 102 01/18/2021   CO2 25 01/18/2021   BUN 13 01/18/2021   CREATININE 0.54 01/18/2021   GLUCOSE 102 (H) 01/18/2021   GFRNONAA >60 01/18/2021   GFRAA >90 03/10/2013   CALCIUM 6.9 (L) 01/18/2021   PHOS 4.4 01/14/2021   PROT 3.8 (L) 01/17/2021   ALBUMIN 1.7 (L) 01/17/2021    BILITOT 1.7 (H) 01/17/2021   ALKPHOS 33 (L) 01/17/2021   AST 24 01/17/2021   ALT 14 01/17/2021   ANIONGAP 6 01/18/2021    CBG (last 3)  Recent Labs    01/17/21 1553 01/17/21 2149 01/18/21 0753  GLUCAP 126* 112* 121*     GFR: Estimated Creatinine Clearance: 39.8 mL/min (by C-G formula based on SCr of 0.54 mg/dL).  Coagulation Profile: Recent Labs  Lab 01/16/21 2259  INR 1.4*    Recent Results (from the past 240 hour(s))  Resp Panel by RT-PCR (Flu A&B, Covid) Nasopharyngeal Swab     Status: None   Collection Time: 01/08/21  2:51 PM   Specimen: Nasopharyngeal Swab; Nasopharyngeal(NP) swabs in vial transport medium  Result Value Ref Range Status   SARS Coronavirus 2 by RT PCR NEGATIVE NEGATIVE Final    Comment: (NOTE) SARS-CoV-2 target nucleic acids are NOT DETECTED.  The SARS-CoV-2 RNA is generally detectable in upper respiratory specimens during the acute phase of infection. The lowest concentration of SARS-CoV-2 viral copies this assay can detect is 138 copies/mL. A negative result does not preclude SARS-Cov-2 infection and should not be used as the sole basis for treatment or other patient management decisions. A negative result may occur with  improper specimen collection/handling, submission of specimen other than nasopharyngeal swab, presence of viral mutation(s) within the areas targeted by this assay, and inadequate number of  viral copies(<138 copies/mL). A negative result must be combined with clinical observations, patient history, and epidemiological information. The expected result is Negative.  Fact Sheet for Patients:  EntrepreneurPulse.com.au  Fact Sheet for Healthcare Providers:  IncredibleEmployment.be  This test is no t yet approved or cleared by the [place] FDA and  has been authorized for detection and/or diagnosis of SARS-CoV-2 by FDA under an Emergency Use Authorization (EUA). This EUA will remain   in effect (meaning this test can be used) for the duration of the COVID-19 declaration under Section 564(b)(1) of the Act, 21 U.S.C.section 360bbb-3(b)(1), unless the authorization is terminated  or revoked sooner.       Influenza A by PCR NEGATIVE NEGATIVE Final   Influenza B by PCR NEGATIVE NEGATIVE Final    Comment: (NOTE) The Xpert Xpress SARS-CoV-2/FLU/RSV plus assay is intended as an aid in the diagnosis of influenza from Nasopharyngeal swab specimens and should not be used as a sole basis for treatment. Nasal washings and aspirates are unacceptable for Xpert Xpress SARS-CoV-2/FLU/RSV testing.  Fact Sheet for Patients: EntrepreneurPulse.com.au  Fact Sheet for Healthcare Providers: IncredibleEmployment.be  This test is not yet approved or cleared by the [place] FDA and has been authorized for detection and/or diagnosis of SARS-CoV-2 by FDA under an Emergency Use Authorization (EUA). This EUA will remain in effect (meaning this test can be used) for the duration of the COVID-19 declaration under Section 564(b)(1) of the Act, 21 U.S.C. section 360bbb-3(b)(1), unless the authorization is terminated or revoked.  Performed at [facility], [place] [address] [place], [place] [address]   Resp Panel by RT-PCR (Flu A&B, Covid) Nasopharyngeal Swab     Status: None   Collection Time: 01/11/21  7:53 PM   Specimen: Nasopharyngeal Swab; Nasopharyngeal(NP) swabs in vial transport medium  Result Value Ref Range Status   SARS Coronavirus 2 by RT PCR NEGATIVE NEGATIVE Final    Comment: (NOTE) SARS-CoV-2 target nucleic acids are NOT DETECTED.  The SARS-CoV-2 RNA is generally detectable in upper respiratory specimens during the acute phase of infection. The lowest concentration of SARS-CoV-2 viral copies this assay can detect is 138 copies/mL. A negative result does not preclude SARS-Cov-2 infection and should not be used as  the sole basis for treatment or other patient management decisions. A negative result may occur with  improper specimen collection/handling, submission of specimen other than nasopharyngeal swab, presence of viral mutation(s) within the areas targeted by this assay, and inadequate number of viral copies(<138 copies/mL). A negative result must be combined with clinical observations, patient history, and epidemiological information. The expected result is Negative.  Fact Sheet for Patients:  EntrepreneurPulse.com.au  Fact Sheet for Healthcare Providers:  IncredibleEmployment.be  This test is no t yet approved or cleared by the [place] FDA and  has been authorized for detection and/or diagnosis of SARS-CoV-2 by FDA under an Emergency Use Authorization (EUA). This EUA will remain  in effect (meaning this test can be used) for the duration of the COVID-19 declaration under Section 564(b)(1) of the Act, 21 U.S.C.section 360bbb-3(b)(1), unless the authorization is terminated  or revoked sooner.       Influenza A by PCR NEGATIVE NEGATIVE Final   Influenza B by PCR NEGATIVE NEGATIVE Final    Comment: (NOTE) The Xpert Xpress SARS-CoV-2/FLU/RSV plus assay is intended as an aid in the diagnosis of influenza from Nasopharyngeal swab specimens and should not be used as a sole basis for treatment. Nasal washings and aspirates are unacceptable  for Xpert Xpress SARS-CoV-2/FLU/RSV testing.  Fact Sheet for Patients: EntrepreneurPulse.com.au  Fact Sheet for Healthcare Providers: IncredibleEmployment.be  This test is not yet approved or cleared by the [place] FDA and has been authorized for detection and/or diagnosis of SARS-CoV-2 by FDA under an Emergency Use Authorization (EUA). This EUA will remain in effect (meaning this test can be used) for the duration of the COVID-19 declaration under Section 564(b)(1) of  the Act, 21 U.S.C. section 360bbb-3(b)(1), unless the authorization is terminated or revoked.  Performed at [facility], [place] [address] [place], [place] [address]   MRSA PCR Screening     Status: None   Collection Time: 01/12/21  1:47 AM   Specimen: Nasopharyngeal  Result Value Ref Range Status   MRSA by PCR NEGATIVE NEGATIVE Final    Comment:        The GeneXpert MRSA Assay (FDA approved for NASAL specimens only), is one component of a comprehensive MRSA colonization surveillance program. It is not intended to diagnose MRSA infection nor to guide or monitor treatment for MRSA infections. Performed at [facility], [place] [address], [place], [place] [address]         Radiology Studies: DG CHEST PORT 1 VIEW  Result Date: 01/17/2021 CLINICAL DATA:  Recurrent bleed.  Shortness of breath. EXAM: PORTABLE CHEST 1 VIEW COMPARISON:  January 16, 2021 FINDINGS: Diffuse increased hazy and interstitial opacities with bilateral small effusions, unchanged. A left central line terminates in the central SVC. The cardiomediastinal silhouette is unchanged. No pneumothorax. IMPRESSION: The pulmonary opacities and small effusions are most consistent with pulmonary edema/volume overload with mild cardiomegaly. An atypical infection is not completely excluded. Recommend clinical correlation. Electronically Signed   By: Dorise Bullion III M.D   On: 01/17/2021 15:58   DG CHEST PORT 1 VIEW  Result Date: 01/16/2021 CLINICAL DATA:  Central catheter placement EXAM: PORTABLE CHEST 1 VIEW COMPARISON:  CT angio abdomen and pelvis 01/11/2021 FINDINGS: Left subclavian approach central venous catheter tip terminates in the mid SVC. Telemetry leads and external support devices overlie the chest. Low lung volumes and atelectasis. Layering bilateral effusions. Some patchy and streaky opacities are present throughout both lungs, right greater than left. No visible pneumothorax. The  aorta is calcified. The remaining cardiomediastinal contours are unremarkable. No acute osseous or soft tissue abnormality. IMPRESSION: Left subclavian approach central venous catheter tip terminates in the mid SVC. Developing bilateral effusions. Heterogeneous opacities in the lungs, could reflect developing infection and/or edema. Electronically Signed   By: Lovena Le M.D.   On: 01/16/2021 21:10        Scheduled Meds: . sodium chloride   Intravenous Once  . sodium chloride   Intravenous Once  . budesonide  6 mg Oral Daily  . calcium carbonate  1,250 mg Oral Daily  . Chlorhexidine Gluconate Cloth  6 each Topical Daily  . insulin aspart  0-15 Units Subcutaneous TID WC  . pantoprazole (PROTONIX) IV  40 mg Intravenous Q24H  . pravastatin  10 mg Oral Daily  . sodium chloride flush  10-40 mL Intracatheter Q12H   Continuous Infusions: . sodium chloride Stopped (01/16/21 0641)  . ciprofloxacin Stopped (01/18/21 0617)  . dextrose 5 % and 0.45 % NaCl with KCl 20 mEq/L 75 mL/hr at 01/18/21 0517  . methocarbamol (ROBAXIN) IV    . metronidazole Stopped (01/18/21 0200)  . phenylephrine (NEO-SYNEPHRINE) Adult infusion Stopped (01/16/21 2151)     LOS: 7 days     Deidre Ala  Lonny Prude, MD [facility] 01/18/2021, 8:46 AM  If 7PM-7AM, please contact night-coverage [email or web address]

## 2021-01-18 NOTE — Progress Notes (Signed)
1 Day Post-Op   Subjective/Chief Complaint: Pt doing well this AM    Objective: Vital signs in last 24 hours: Temp:  [97.8 F (36.6 C)-98.9 F (37.2 C)] 98.9 F (37.2 C) (03/06 0330) Pulse Rate:  [82-115] 86 (03/06 0500) Resp:  [8-24] 9 (03/06 0500) BP: (99-192)/(45-100) 112/51 (03/06 0500) SpO2:  [91 %-100 %] 96 % (03/06 0500) Arterial Line BP: (106-192)/(52-152) 113/53 (03/06 0500) Weight:  [51.6 kg-55 kg] 55 kg (03/06 0500) Last BM Date: 01/17/21  Intake/Output from previous day: 03/05 0701 - 03/06 0700 In: 2456.5 [I.V.:1614.4; IV Piggyback:842.1] Out: 2760 [Urine:2700; Stool:20; Blood:40] Intake/Output this shift: No intake/output data recorded.  General appearance: alert and cooperative GI: soft, non-tender; bowel sounds normal; no masses,  no organomegaly and inc c/d/i, ostomy pink, patent  Lab Results:  Recent Labs    01/17/21 0457 01/18/21 0526  WBC 15.0* 16.9*  HGB 9.7* 9.1*  HCT 28.9* 26.9*  PLT 214 290   BMET Recent Labs    01/16/21 0539 01/17/21 0457  NA 135 132*  K 3.6 3.4*  CL 105 104  CO2 25 22  GLUCOSE 228* 163*  BUN 14 12  CREATININE 0.64 0.58  CALCIUM 7.2* 6.7*   PT/INR Recent Labs    01/16/21 2259  LABPROT 17.1*  INR 1.4*   ABG Recent Labs    01/15/21 1316  PHART 7.262*  HCO3 22.8    Studies/Results: DG CHEST PORT 1 VIEW  Result Date: 01/17/2021 CLINICAL DATA:  Recurrent bleed.  Shortness of breath. EXAM: PORTABLE CHEST 1 VIEW COMPARISON:  January 16, 2021 FINDINGS: Diffuse increased hazy and interstitial opacities with bilateral small effusions, unchanged. A left central line terminates in the central SVC. The cardiomediastinal silhouette is unchanged. No pneumothorax. IMPRESSION: The pulmonary opacities and small effusions are most consistent with pulmonary edema/volume overload with mild cardiomegaly. An atypical infection is not completely excluded. Recommend clinical correlation. Electronically Signed   By: Dorise Bullion  III M.D   On: 01/17/2021 15:58   DG CHEST PORT 1 VIEW  Result Date: 01/16/2021 CLINICAL DATA:  Central catheter placement EXAM: PORTABLE CHEST 1 VIEW COMPARISON:  CT angio abdomen and pelvis 01/11/2021 FINDINGS: Left subclavian approach central venous catheter tip terminates in the mid SVC. Telemetry leads and external support devices overlie the chest. Low lung volumes and atelectasis. Layering bilateral effusions. Some patchy and streaky opacities are present throughout both lungs, right greater than left. No visible pneumothorax. The aorta is calcified. The remaining cardiomediastinal contours are unremarkable. No acute osseous or soft tissue abnormality. IMPRESSION: Left subclavian approach central venous catheter tip terminates in the mid SVC. Developing bilateral effusions. Heterogeneous opacities in the lungs, could reflect developing infection and/or edema. Electronically Signed   By: Lovena Le M.D.   On: 01/16/2021 21:10    Anti-infectives: Anti-infectives (From admission, onward)   Start     Dose/Rate Route Frequency Ordered Stop   01/15/21 1800  ciprofloxacin (CIPRO) IVPB 400 mg        400 mg 200 mL/hr over 60 Minutes Intravenous Every 12 hours 01/15/21 1609     01/15/21 1700  metroNIDAZOLE (FLAGYL) IVPB 500 mg        500 mg 100 mL/hr over 60 Minutes Intravenous Every 8 hours 01/15/21 1609     01/15/21 0800  cefoTEtan (CEFOTAN) 2 g in sodium chloride 0.9 % 100 mL IVPB        2 g 200 mL/hr over 30 Minutes Intravenous On call to O.R. 01/15/21 7628 01/15/21  1145      Assessment/Plan: COPD HTN  POD 3, s/p ex lap with partial colectomy (L) and end colostomy, Dr. Brantley Stage 01/15/21 POD1 s/p completion colectomy and end ileostomy for con't GIB, Dr. Rosendo Gros 01/17/21 -await bowel fxm -CLD today -mobilize FEN -CLD, IVF VTE -Pope to resume lovenox ID -C/F   LOS: 7 days    Kathleen Pope 01/18/2021

## 2021-01-19 ENCOUNTER — Encounter (HOSPITAL_COMMUNITY): Payer: Self-pay | Admitting: Gastroenterology

## 2021-01-19 DIAGNOSIS — K625 Hemorrhage of anus and rectum: Secondary | ICD-10-CM | POA: Diagnosis not present

## 2021-01-19 DIAGNOSIS — K5520 Angiodysplasia of colon without hemorrhage: Secondary | ICD-10-CM | POA: Diagnosis not present

## 2021-01-19 DIAGNOSIS — D649 Anemia, unspecified: Secondary | ICD-10-CM

## 2021-01-19 DIAGNOSIS — K559 Vascular disorder of intestine, unspecified: Secondary | ICD-10-CM

## 2021-01-19 DIAGNOSIS — R578 Other shock: Secondary | ICD-10-CM

## 2021-01-19 DIAGNOSIS — E8770 Fluid overload, unspecified: Secondary | ICD-10-CM

## 2021-01-19 DIAGNOSIS — K922 Gastrointestinal hemorrhage, unspecified: Secondary | ICD-10-CM | POA: Diagnosis not present

## 2021-01-19 DIAGNOSIS — D62 Acute posthemorrhagic anemia: Secondary | ICD-10-CM | POA: Diagnosis not present

## 2021-01-19 LAB — CBC
HCT: 25.9 % — ABNORMAL LOW (ref 36.0–46.0)
Hemoglobin: 8.7 g/dL — ABNORMAL LOW (ref 12.0–15.0)
MCH: 29.4 pg (ref 26.0–34.0)
MCHC: 33.6 g/dL (ref 30.0–36.0)
MCV: 87.5 fL (ref 80.0–100.0)
Platelets: 369 10*3/uL (ref 150–400)
RBC: 2.96 MIL/uL — ABNORMAL LOW (ref 3.87–5.11)
RDW: 16.2 % — ABNORMAL HIGH (ref 11.5–15.5)
WBC: 13.3 10*3/uL — ABNORMAL HIGH (ref 4.0–10.5)
nRBC: 0 % (ref 0.0–0.2)

## 2021-01-19 LAB — SURGICAL PATHOLOGY

## 2021-01-19 LAB — GLUCOSE, CAPILLARY
Glucose-Capillary: 202 mg/dL — ABNORMAL HIGH (ref 70–99)
Glucose-Capillary: 107 mg/dL — ABNORMAL HIGH (ref 70–99)
Glucose-Capillary: 126 mg/dL — ABNORMAL HIGH (ref 70–99)
Glucose-Capillary: 146 mg/dL — ABNORMAL HIGH (ref 70–99)

## 2021-01-19 LAB — BASIC METABOLIC PANEL
Anion gap: 6 (ref 5–15)
BUN: 7 mg/dL — ABNORMAL LOW (ref 8–23)
CO2: 31 mmol/L (ref 22–32)
Calcium: 6.9 mg/dL — ABNORMAL LOW (ref 8.9–10.3)
Chloride: 94 mmol/L — ABNORMAL LOW (ref 98–111)
Creatinine, Ser: 0.61 mg/dL (ref 0.44–1.00)
GFR, Estimated: >60 mL/min (ref >60)
Glucose, Bld: 145 mg/dL — ABNORMAL HIGH (ref 70–99)
Potassium: 2.9 mmol/L — ABNORMAL LOW (ref 3.5–5.1)
Sodium: 131 mmol/L — ABNORMAL LOW (ref 135–145)

## 2021-01-19 LAB — MAGNESIUM: Magnesium: 1.5 mg/dL — ABNORMAL LOW (ref 1.7–2.4)

## 2021-01-19 LAB — POTASSIUM: Potassium: 4 mmol/L (ref 3.5–5.1)

## 2021-01-19 MED ORDER — MAGNESIUM SULFATE 2 GM/50ML IV SOLN
2.0000 g | Freq: Once | INTRAVENOUS | Status: AC
  Administered 2021-01-19: 2 g via INTRAVENOUS
  Filled 2021-01-19: qty 50

## 2021-01-19 MED ORDER — ACETAMINOPHEN 500 MG PO TABS
1000.0000 mg | ORAL_TABLET | Freq: Four times a day (QID) | ORAL | Status: DC
  Administered 2021-01-19 – 2021-01-29 (×35): 1000 mg via ORAL
  Filled 2021-01-19 (×36): qty 2

## 2021-01-19 MED ORDER — METOPROLOL TARTRATE 25 MG PO TABS
25.0000 mg | ORAL_TABLET | Freq: Two times a day (BID) | ORAL | Status: DC
  Administered 2021-01-19 – 2021-01-25 (×8): 25 mg via ORAL
  Filled 2021-01-19 (×10): qty 1

## 2021-01-19 MED ORDER — POTASSIUM CHLORIDE CRYS ER 20 MEQ PO TBCR
40.0000 meq | EXTENDED_RELEASE_TABLET | ORAL | Status: AC
  Administered 2021-01-19 (×2): 40 meq via ORAL
  Filled 2021-01-19 (×2): qty 2

## 2021-01-19 NOTE — Progress Notes (Signed)
PROGRESS NOTE    Kathleen Pope  ZJQ:[id number] DOB: 08-13-1933 DOA: 01/11/2021 PCP: Redmond School, MD   Brief Narrative: Kathleen Pope is a 87 y.o. 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 Patient presented secondary recurrent GI bleeding. Patient underwent coil embolization which unfortunately appears to have left to ischemic colon requiring partial colectomy. After partial colectomy, patient developed hypotension and brisk GI bleeding requiring total colectomy.    Assessment & Plan:   Principal Problem:   Rectal bleed Active Problems:   Chronic lower GI bleeding   AVM (arteriovenous malformation) of colon   HTN (hypertension)   Acute blood loss anemia   GI bleed   Lower GI bleeding Recurrent diverticular bleed Patient presented with recurrent lower GI bleeding. Recently admitted with a similar presentation. On this admission, she is s/p mesenteric arteriogram with successful coil embolization of left colic artery on 2/40. Recurrent large volume episode of hematochezia overnight on 3/1. GI re-consulted and performed flexible sigmoidoscopy on 3/2 which was significant for colonic ischemia. After partial colectomy, she had recurrent GI bleeding with attempted bedside colonoscopy under conscious sedation. Colonoscopy unsuccessful in identifying lesion secondary to copious blood and recommendation for total colectomy was made which was performed on 3/5.  Colonic ischemia Seen on endoscopy. General surgery consulted and performed ex lap with partial colectomy and end colostomy on 3/3. NG tube placed post-op then removed. Patient underwent total colectomy with end ileostomy on 3/5. -General surgery recommendations: Clear liquid diet  Acute blood loss anemia Secondary to above. Patient has received 7 units of PRBC for GI bleeding in addition to peri-op transfusions. Hemoglobin appears to be  stable.  Hypovolemic shock Secondary to blood loss from GI bleeding. Patient required aggressive fluid/blood resuscitation in addition to short management with vasopressor support. Now resolved.  Acute respiratory failure with hypoxia Fluid overload Possibly secondary to fluid overload. Chest x-ray confirmed. Patient is net + 10 L with a 15 kg weight gain -Lasix 40 mg BID; will need to be mindful of severe AS and not over-diurese -Daily BMP -Daily weights/strict in and outs -Wean oxygen to room air  Severe aortic stenosis Seen on Transthoracic Echocardiogram. Will need outpatient cardiology follow-up.  Hyperglycemia Hemoglobin A1C of 7.4%. Would benefit from metformin on discharge if appropriate. -SSI  COPD Patient is a current smoker. COPD stable.  Primary hypertension Patient is on losartan as an outpatient which was held secondary to hypotension from acute blood loss. -Continue to hold losartan  Collagenous colitis Patient is on budesonide as an outpatient. Currently takes varying doses but most recently was taking 12 mg daily. Currently on prescribed dose of 6 mg daily -Continue budesonide 6 mg daily  Leukocytosis Possibly related to budesonide, but it seems more so secondary to ischemia. Trending down.  Hyperlipidemia -Continue Pravastatin  Underweight Weight is currently stable. Down about 10 lbs  from several years ago. -Dietician consulted   DVT prophylaxis: SCDs secondary to GI bleeding Code Status:   Code Status: DNR Family Communication: None at bedside Disposition Plan: Discharge home pending general surgery recommendations   Consultants:   Gastroenterology Velora Heckler)  General surgery  Procedures:   POST MESENTERIC ARTERIOGRAM AND PERCUTANEOUS EMBOLIZATION (01/12/2021)  EGD/FLEXIBLE SIGMOIDOSCOPY (01/15/2021)  PARTIAL COLECTOMY (01/15/2021)  Antimicrobials:  Ciprofloxacin  Flagyl   Subjective: Abdominal pain. No other  concerns.  Objective: Vitals:   01/19/21 0700 01/19/21 0800 01/19/21 0902 01/19/21 1200  BP: (!) 153/71 (!) 185/64  Marland Kitchen)  162/67  Pulse: 89 (!) 102 (!) 117 68  Resp: 10 15  17   Temp:  98.7 F (37.1 C)  97.6 F (36.4 C)  TempSrc:  Oral  Axillary  SpO2: 95% 90%  95%  Weight:  52.5 kg    Height:        Intake/Output Summary (Last 24 hours) at 01/19/2021 1323 Last data filed at 01/19/2021 1200 Gross per 24 hour  Intake 2756.42 ml  Output 3200 ml  Net -443.58 ml   Filed Weights   01/17/21 1632 01/18/21 0500 01/19/21 0800  Weight: 51.6 kg 55 kg 52.5 kg    Examination:  General exam: Appears calm and comfortable Respiratory system: Diminished. Respiratory effort normal. Cardiovascular system: S1 & S2 heard, RRR. No murmurs, rubs, gallops or clicks. Gastrointestinal system: Abdomen is nondistended, soft and diffusely tender. No organomegaly or masses felt. Decreased bowel sounds heard. Central nervous system: Alert and oriented. No focal neurological deficits. Musculoskeletal: Significant pitting edema noted of left arm/elbow. No calf tenderness Skin: No cyanosis. No rashes Psychiatry: Judgement and insight appear normal. Mood & affect appropriate.     Data Reviewed: I have personally reviewed following labs and imaging studies  CBC Lab Results  Component Value Date   WBC 13.3 (H) 01/19/2021   RBC 2.96 (L) 01/19/2021   HGB 8.7 (L) 01/19/2021   HCT 25.9 (L) 01/19/2021   MCV 87.5 01/19/2021   MCH 29.4 01/19/2021   PLT 369 01/19/2021   MCHC 33.6 01/19/2021   RDW 16.2 (H) 01/19/2021   LYMPHSABS 1.4 01/14/2021   MONOABS 2.1 (H) 01/14/2021   EOSABS 0.1 01/14/2021   BASOSABS 0.0 78/93/8101     Last metabolic panel Lab Results  Component Value Date   NA 131 (L) 01/19/2021   K 2.9 (L) 01/19/2021   CL 94 (L) 01/19/2021   CO2 31 01/19/2021   BUN 7 (L) 01/19/2021   CREATININE 0.61 01/19/2021   GLUCOSE 145 (H) 01/19/2021   GFRNONAA >60 01/19/2021   GFRAA >90 03/10/2013    CALCIUM 6.9 (L) 01/19/2021   PHOS 4.4 01/14/2021   PROT 3.8 (L) 01/17/2021   ALBUMIN 1.7 (L) 01/17/2021   BILITOT 1.7 (H) 01/17/2021   ALKPHOS 33 (L) 01/17/2021   AST 24 01/17/2021   ALT 14 01/17/2021   ANIONGAP 6 01/19/2021    CBG (last 3)  Recent Labs    01/18/21 2132 01/19/21 0733 01/19/21 1225  GLUCAP 170* 202* 107*     GFR: Estimated Creatinine Clearance: 39.8 mL/min (by C-G formula based on SCr of 0.61 mg/dL).  Coagulation Profile: Recent Labs  Lab 01/16/21 2259  INR 1.4*    Recent Results (from the past 240 hour(s))  Resp Panel by RT-PCR (Flu A&B, Covid) Nasopharyngeal Swab     Status: None   Collection Time: 01/11/21  7:53 PM   Specimen: Nasopharyngeal Swab; Nasopharyngeal(NP) swabs in vial transport medium  Result Value Ref Range Status   SARS Coronavirus 2 by RT PCR NEGATIVE NEGATIVE Final    Comment: (NOTE) SARS-CoV-2 target nucleic acids are NOT DETECTED.  The SARS-CoV-2 RNA is generally detectable in upper respiratory specimens during the acute phase of infection. The lowest concentration of SARS-CoV-2 viral copies this assay can detect is 138 copies/mL. A negative result does not preclude SARS-Cov-2 infection and should not be used as the sole basis for treatment or other patient management decisions. A negative result may occur with  improper specimen collection/handling, submission of specimen other than nasopharyngeal swab, presence of viral  mutation(s) within the areas targeted by this assay, and inadequate number of viral copies(<138 copies/mL). A negative result must be combined with clinical observations, patient history, and epidemiological information. The expected result is Negative.  Fact Sheet for Patients:  EntrepreneurPulse.com.au  Fact Sheet for Healthcare Providers:  IncredibleEmployment.be  This test is no t yet approved or cleared by the [place] FDA and  has been authorized for  detection and/or diagnosis of SARS-CoV-2 by FDA under an Emergency Use Authorization (EUA). This EUA will remain  in effect (meaning this test can be used) for the duration of the COVID-19 declaration under Section 564(b)(1) of the Act, 21 U.S.C.section 360bbb-3(b)(1), unless the authorization is terminated  or revoked sooner.       Influenza A by PCR NEGATIVE NEGATIVE Final   Influenza B by PCR NEGATIVE NEGATIVE Final    Comment: (NOTE) The Xpert Xpress SARS-CoV-2/FLU/RSV plus assay is intended as an aid in the diagnosis of influenza from Nasopharyngeal swab specimens and should not be used as a sole basis for treatment. Nasal washings and aspirates are unacceptable for Xpert Xpress SARS-CoV-2/FLU/RSV testing.  Fact Sheet for Patients: EntrepreneurPulse.com.au  Fact Sheet for Healthcare Providers: IncredibleEmployment.be  This test is not yet approved or cleared by the [place] FDA and has been authorized for detection and/or diagnosis of SARS-CoV-2 by FDA under an Emergency Use Authorization (EUA). This EUA will remain in effect (meaning this test can be used) for the duration of the COVID-19 declaration under Section 564(b)(1) of the Act, 21 U.S.C. section 360bbb-3(b)(1), unless the authorization is terminated or revoked.  Performed at [facility], [place] [address], [place], [place] [address]   MRSA PCR Screening     Status: None   Collection Time: 01/12/21  1:47 AM   Specimen: Nasopharyngeal  Result Value Ref Range Status   MRSA by PCR NEGATIVE NEGATIVE Final    Comment:        The GeneXpert MRSA Assay (FDA approved for NASAL specimens only), is one component of a comprehensive MRSA colonization surveillance program. It is not intended to diagnose MRSA infection nor to guide or monitor treatment for MRSA infections. Performed at [facility], [place] [address], [place], [place] [address]          Radiology Studies: DG CHEST PORT 1 VIEW  Result Date: 01/17/2021 CLINICAL DATA:  Recurrent bleed.  Shortness of breath. EXAM: PORTABLE CHEST 1 VIEW COMPARISON:  January 16, 2021 FINDINGS: Diffuse increased hazy and interstitial opacities with bilateral small effusions, unchanged. A left central line terminates in the central SVC. The cardiomediastinal silhouette is unchanged. No pneumothorax. IMPRESSION: The pulmonary opacities and small effusions are most consistent with pulmonary edema/volume overload with mild cardiomegaly. An atypical infection is not completely excluded. Recommend clinical correlation. Electronically Signed   By: Dorise Bullion III M.D   On: 01/17/2021 15:58        Scheduled Meds: . sodium chloride   Intravenous Once  . sodium chloride   Intravenous Once  . acetaminophen  1,000 mg Oral Q6H  . budesonide  6 mg Oral Daily  . calcium carbonate  1,250 mg Oral Daily  . Chlorhexidine Gluconate Cloth  6 each Topical Daily  . furosemide  40 mg Intravenous BID  . insulin aspart  0-15 Units Subcutaneous TID WC  . metoprolol tartrate  25 mg Oral BID  . pantoprazole (PROTONIX) IV  40 mg Intravenous Q24H  . pravastatin  10 mg Oral Daily  . sodium  chloride flush  10-40 mL Intracatheter Q12H   Continuous Infusions: . sodium chloride Stopped (01/16/21 0641)  . methocarbamol (ROBAXIN) IV Stopped (01/19/21 0027)  . phenylephrine (NEO-SYNEPHRINE) Adult infusion Stopped (01/16/21 2151)     LOS: 8 days     Cordelia Poche, MD [facility] 01/19/2021, 1:23 PM  If 7PM-7AM, please contact night-coverage [email or web address]

## 2021-01-19 NOTE — Progress Notes (Addendum)
Physical Therapy Treatment Patient Details Name: Kathleen Pope MRN: [id number] DOB: Feb 21, 1933 Today's Date: 01/19/2021    History of Present Illness Kathleen Pope is a 87 y.o. 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 Pt now s/p partial colectomy on 3/3 and Exp Lap with completion of colectomy and end ileostomy for con't GIB on 01/17/21.    PT Comments    Patient making good progress today and reports MD encouraged pt to mobilize in room but not long hallway distances today. Patient required min guard/assist for bed mobility and transfers and ambulated w~30' with IV pole. Pt required bil UE support to steady with IV pole and required min assist for 3 episodes of LOB during gait. VSS throughout. Educated pt on benefits of mobilizing and encouraged to ambulate short distances with RN in room. Acute PT will continue to progress pt as able; anticipate she will improve to safe level for discharge home with HHPT and assist from family.    Follow Up Recommendations  Home health PT (dependent on acute stay progress)     Equipment Recommendations  3in1 (PT) (TBD)    Recommendations for Other Services       Precautions / Restrictions Precautions Precautions: Fall Precaution Comments: monitor vitals, recent abd sx Restrictions Weight Bearing Restrictions: No    Mobility  Bed Mobility Overal bed mobility: Needs Assistance Bed Mobility: Rolling;Sidelying to Sit Rolling: Min guard;Min assist Sidelying to sit: Min assist;HOB elevated       General bed mobility comments: cues to reach for bed rail to roll and protect abdominal sx sit. Min assist to guide reach/guard for pt to pull and move forward. min assist to press up and raise trunk to sit EOB.    Transfers Overall transfer level: Needs assistance Equipment used: Rolling  walker (2 wheeled) Transfers: Sit to/from Stand Sit to Stand: Min guard         General transfer comment: min A for steadying  Ambulation/Gait Ambulation/Gait assistance: Min Web designer (Feet): 30 Feet Assistive device: IV Pole Gait Pattern/deviations: Decreased stride length;Step-to pattern;Shuffle;Staggering left;Staggering right Gait velocity: decreased   General Gait Details: cues for safe hand placement on IV pole as pt more steady with bil UE support. 3 episodes of LOB requiring min assis to recover and close min guard intermittently. Assist for IV management to complete turns in room.   Stairs             Wheelchair Mobility    Modified Rankin (Stroke Patients Only)       Balance Overall balance assessment: Needs assistance Sitting-balance support: Feet supported Sitting balance-Leahy Scale: Fair     Standing balance support: During functional activity;Bilateral upper extremity supported Standing balance-Leahy Scale: Poor Standing balance comment: min A                            Cognition Arousal/Alertness: Awake/alert Behavior During Therapy: WFL for tasks assessed/performed Overall Cognitive Status: Within Functional Limits for tasks assessed                                        Exercises      General Comments General comments (skin integrity, edema, etc.): pt ambulated short bout  on RA, dropped to 88%, HR in 80-90's with acitivaty and resting in 60's. BP stable; 140/71 mmHg prior to start; 119/68 mmHg after gait and recovered to 162/67 mmHg at EOS with pt sitting.      Pertinent Vitals/Pain Pain Assessment: Faces Faces Pain Scale: Hurts a little bit Pain Location: abdomen Pain Descriptors / Indicators: Grimacing;Guarding;Sore;Discomfort Pain Intervention(s): Limited activity within patient's tolerance;Monitored during session;Repositioned    Home Living                      Prior Function             PT Goals (current goals can now be found in the care plan section) Acute Rehab PT Goals Patient Stated Goal: Regain IND PT Goal Formulation: With patient Time For Goal Achievement: 01/26/21 Potential to Achieve Goals: Good Progress towards PT goals: Progressing toward goals    Frequency    Min 3X/week      PT Plan Current plan remains appropriate    Co-evaluation              AM-PAC PT "6 Clicks" Mobility   Outcome Measure  Help needed turning from your back to your side while in a flat bed without using bedrails?: A Little Help needed moving from lying on your back to sitting on the side of a flat bed without using bedrails?: A Little Help needed moving to and from a bed to a chair (including a wheelchair)?: A Little Help needed standing up from a chair using your arms (e.g., wheelchair or bedside chair)?: A Little Help needed to walk in hospital room?: A Little Help needed climbing 3-5 steps with a railing? : A Lot 6 Click Score: 17    End of Session Equipment Utilized During Treatment: Oxygen Activity Tolerance: Patient tolerated treatment well Patient left: with call bell/phone within reach;in chair;with chair alarm set;with family/visitor present Nurse Communication: Mobility status PT Visit Diagnosis: Difficulty in walking, not elsewhere classified (R26.2);Unsteadiness on feet (R26.81)     Time: 1975-8832 PT Time Calculation (min) (ACUTE ONLY): 28 min  Charges:  $Gait Training: 8-22 mins $Therapeutic Activity: 8-22 mins                     Verner Mould, DPT Acute Rehabilitation Services Office [phone] Pager [phone]     Jacques Navy 01/19/2021, 1:02 PM

## 2021-01-19 NOTE — Progress Notes (Addendum)
2 Days Post-Op   Subjective/Chief Complaint: Pt doing well this AM.  No new complaints.  Doesn't seem to have tried liquids yesterday.   Objective: Vital signs in last 24 hours: Temp:  [98.3 F (36.8 C)-98.9 F (37.2 C)] 98.5 F (36.9 C) (03/07 0332) Pulse Rate:  [72-107] 102 (03/07 0800) Resp:  [9-25] 15 (03/07 0800) BP: (110-185)/(49-106) 185/64 (03/07 0800) SpO2:  [90 %-100 %] 90 % (03/07 0800) Arterial Line BP: (117-168)/(58-100) 119/61 (03/06 1600) Last BM Date: 01/17/21  Intake/Output from previous day: 03/06 0701 - 03/07 0700 In: 2499.8 [I.V.:1781.3; IV Piggyback:718.6] Out: 3050 [Urine:3050] Intake/Output this shift: No intake/output data recorded.  PE: Abd: soft, appropriately tender, ileostomy with no output currently except some serous fluid.  Stoma is pink and viable.  Midline wound is clean and packed.  Lab Results:  Recent Labs    01/18/21 0526 01/19/21 0403  WBC 16.9* 13.3*  HGB 9.1* 8.7*  HCT 26.9* 25.9*  PLT 290 369   BMET Recent Labs    01/18/21 0526 01/19/21 0403  NA 133* 131*  K 4.6 2.9*  CL 102 94*  CO2 25 31  GLUCOSE 102* 145*  BUN 13 7*  CREATININE 0.54 0.61  CALCIUM 6.9* 6.9*   PT/INR Recent Labs    01/16/21 2259  LABPROT 17.1*  INR 1.4*   ABG No results for input(s): PHART, HCO3 in the last 72 hours.  Invalid input(s): PCO2, PO2  Studies/Results: DG CHEST PORT 1 VIEW  Result Date: 01/17/2021 CLINICAL DATA:  Recurrent bleed.  Shortness of breath. EXAM: PORTABLE CHEST 1 VIEW COMPARISON:  January 16, 2021 FINDINGS: Diffuse increased hazy and interstitial opacities with bilateral small effusions, unchanged. A left central line terminates in the central SVC. The cardiomediastinal silhouette is unchanged. No pneumothorax. IMPRESSION: The pulmonary opacities and small effusions are most consistent with pulmonary edema/volume overload with mild cardiomegaly. An atypical infection is not completely excluded. Recommend clinical  correlation. Electronically Signed   By: Dorise Bullion III M.D   On: 01/17/2021 15:58    Anti-infectives: Anti-infectives (From admission, onward)   Start     Dose/Rate Route Frequency Ordered Stop   01/15/21 1800  ciprofloxacin (CIPRO) IVPB 400 mg        400 mg 200 mL/hr over 60 Minutes Intravenous Every 12 hours 01/15/21 1609     01/15/21 1700  metroNIDAZOLE (FLAGYL) IVPB 500 mg        500 mg 100 mL/hr over 60 Minutes Intravenous Every 8 hours 01/15/21 1609     01/15/21 0800  cefoTEtan (CEFOTAN) 2 g in sodium chloride 0.9 % 100 mL IVPB        2 g 200 mL/hr over 30 Minutes Intravenous On call to O.R. 01/15/21 0705 01/15/21 1145      Assessment/Plan: COPD HTN  POD 4, s/p ex lap with partial colectomy (L) and end colostomy, Dr. Brantley Stage 01/15/21 POD2, s/p completion colectomy and end ileostomy for con't GIB, Dr. Rosendo Gros 01/17/21 -await bowel fx -CLD today -mobilize -no evidence of intra-operative infection.  Will DC Cipro/Flagyl -agree with replacement of K to help with gut motility -BID dressing changes to midline wound -tylenol -WOC consult for new ileostomy  FEN -CLD, IVF VTE -OK to resume lovenox ID -C/F stopped today   LOS: 8 days    Henreitta Cea 01/19/2021

## 2021-01-19 NOTE — Consult Note (Signed)
[facility] Nurse ostomy follow up Stoma type/location: RMQ ileostomy  Daughter, who is neighbor, is at bedside and will observe pouch change.  Stomal assessment/size: 1 1/2" well budded  Pink and moist  Scant amount of dark stool noted in pouch and at os.  Peristomal assessment: intact Treatment options for stomal/peristomal skin: barrier ring and 1 piece convex pouch Output dark brown stool  Informed that output will be liquid when stool resumes Ostomy pouching: 1pc.convex with barrier ring Education provided: Pouch change performed and patient and daughter participated and asked appropriate questions.  Barrier ring and convex pouch demonstrated and rationale for use.Cleansed skin, applied new pouch and demonstrated roll closure.  They agree to next session Wednesday at 1100 Enrolled patient in [facility] Discharge program: No Will follow. Domenic Moras MSN, RN, FNP-BC CWON Wound, Ostomy, Continence Nurse Pager [phone]

## 2021-01-19 NOTE — Plan of Care (Signed)
  Problem: Education: Goal: Knowledge of General Education information will improve Description: Including pain rating scale, medication(s)/side effects and non-pharmacologic comfort measures Outcome: Progressing   Problem: Clinical Measurements: Goal: Respiratory complications will improve Outcome: Progressing   Problem: Coping: Goal: Level of anxiety will decrease Outcome: Progressing   Problem: Elimination: Goal: Will not experience complications related to urinary retention Outcome: Progressing

## 2021-01-19 NOTE — TOC Progression Note (Signed)
Transition of Care [facility]) - Progression Note    Patient Details  Name: Kathleen Pope MRN: [id number] Date of Birth: Oct 30, 1933  [facility] CM/SW Contact  Leeroy Cha, RN Phone Number: 01/19/2021, 9:41 AM  Clinical Narrative:    Principal Problem:   Rectal bleed Active Problems:   Chronic lower GI bleeding   AVM (arteriovenous malformation) of colon   HTN (hypertension)   Acute blood loss anemia   GI bleed PLAN:following for toc  Expected Discharge Plan: Home/Self Care Barriers to Discharge: Continued Medical Work up  Expected Discharge Plan and Services Expected Discharge Plan: Home/Self Care   Discharge Planning Services: CM Consult   Living arrangements for the past 2 months: Single Family Home Expected Discharge Date:  (unknown)                                     Social Determinants of Health (SDOH) Interventions    Readmission Risk Interventions No flowsheet data found.

## 2021-01-20 DIAGNOSIS — K5520 Angiodysplasia of colon without hemorrhage: Secondary | ICD-10-CM | POA: Diagnosis not present

## 2021-01-20 DIAGNOSIS — K922 Gastrointestinal hemorrhage, unspecified: Secondary | ICD-10-CM | POA: Diagnosis not present

## 2021-01-20 DIAGNOSIS — K625 Hemorrhage of anus and rectum: Secondary | ICD-10-CM | POA: Diagnosis not present

## 2021-01-20 DIAGNOSIS — D62 Acute posthemorrhagic anemia: Secondary | ICD-10-CM | POA: Diagnosis not present

## 2021-01-20 LAB — CBC
HCT: 30.5 % — ABNORMAL LOW (ref 36.0–46.0)
Hemoglobin: 9.8 g/dL — ABNORMAL LOW (ref 12.0–15.0)
MCH: 29.2 pg (ref 26.0–34.0)
MCHC: 32.1 g/dL (ref 30.0–36.0)
MCV: 90.8 fL (ref 80.0–100.0)
Platelets: 504 10*3/uL — ABNORMAL HIGH (ref 150–400)
RBC: 3.36 MIL/uL — ABNORMAL LOW (ref 3.87–5.11)
RDW: 16.3 % — ABNORMAL HIGH (ref 11.5–15.5)
WBC: 13.3 10*3/uL — ABNORMAL HIGH (ref 4.0–10.5)
nRBC: 0 % (ref 0.0–0.2)

## 2021-01-20 LAB — BASIC METABOLIC PANEL
Anion gap: 6 (ref 5–15)
BUN: 10 mg/dL (ref 8–23)
CO2: 32 mmol/L (ref 22–32)
Calcium: 7.7 mg/dL — ABNORMAL LOW (ref 8.9–10.3)
Chloride: 95 mmol/L — ABNORMAL LOW (ref 98–111)
Creatinine, Ser: 0.66 mg/dL (ref 0.44–1.00)
GFR, Estimated: >60 mL/min (ref >60)
Glucose, Bld: 109 mg/dL — ABNORMAL HIGH (ref 70–99)
Potassium: 4.6 mmol/L (ref 3.5–5.1)
Sodium: 133 mmol/L — ABNORMAL LOW (ref 135–145)

## 2021-01-20 LAB — GLUCOSE, CAPILLARY
Glucose-Capillary: 100 mg/dL — ABNORMAL HIGH (ref 70–99)
Glucose-Capillary: 133 mg/dL — ABNORMAL HIGH (ref 70–99)
Glucose-Capillary: 112 mg/dL — ABNORMAL HIGH (ref 70–99)
Glucose-Capillary: 106 mg/dL — ABNORMAL HIGH (ref 70–99)

## 2021-01-20 LAB — MAGNESIUM: Magnesium: 2.2 mg/dL (ref 1.7–2.4)

## 2021-01-20 LAB — SURGICAL PATHOLOGY

## 2021-01-20 MED ORDER — ADULT MULTIVITAMIN W/MINERALS CH
1.0000 | ORAL_TABLET | Freq: Every day | ORAL | Status: DC
  Administered 2021-01-20 – 2021-01-29 (×10): 1 via ORAL
  Filled 2021-01-20 (×10): qty 1

## 2021-01-20 MED ORDER — ENOXAPARIN SODIUM 30 MG/0.3ML ~~LOC~~ SOLN
30.0000 mg | Freq: Every day | SUBCUTANEOUS | Status: DC
  Administered 2021-01-20 – 2021-01-29 (×10): 30 mg via SUBCUTANEOUS
  Filled 2021-01-20 (×10): qty 0.3

## 2021-01-20 MED ORDER — ENSURE SURGERY PO LIQD
237.0000 mL | Freq: Two times a day (BID) | ORAL | Status: DC
  Administered 2021-01-21 – 2021-01-28 (×14): 237 mL via ORAL
  Filled 2021-01-20 (×15): qty 237

## 2021-01-20 NOTE — Progress Notes (Signed)
Chaplain engaged in an initial visit with Cason.  Zerina expressed gratefulness for being alive considering what her body has gone through.  She noted that she wanted to uplift her health, her church community and her daughter in prayer.  She is connected to her faith and sees God and a divine purpose as the reasons for her still being here.  Chaplain affirmed Kasie's health journey and faith.    Chaplain offered ministries of presence, listening and prayer with Marieclaire.    01/20/21 1200  Clinical Encounter Type  Visited With Patient  Visit Type Spiritual support

## 2021-01-20 NOTE — Progress Notes (Signed)
PROGRESS NOTE    Kathleen Pope  ZRA:[id number] DOB: [birth year] DOA: 01/11/2021 PCP: Redmond School, MD   Brief Narrative: Kathleen Pope is a 87 y.o. 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 Patient presented secondary recurrent GI bleeding. Patient underwent coil embolization which unfortunately appears to have left to ischemic colon requiring partial colectomy. After partial colectomy, patient developed hypotension and brisk GI bleeding requiring total colectomy.    Assessment & Plan:   Principal Problem:   Rectal bleed Active Problems:   Chronic lower GI bleeding   AVM (arteriovenous malformation) of colon   HTN (hypertension)   Acute blood loss anemia   GI bleed   Large bowel ischemia (HCC)   Fluid overload   Hemorrhagic shock (HCC)   Lower GI bleeding Recurrent diverticular bleed Patient presented with recurrent lower GI bleeding. Recently admitted with a similar presentation. On this admission, she is s/p mesenteric arteriogram with successful coil embolization of left colic artery on 5/45. Recurrent large volume episode of hematochezia overnight on 3/1. GI re-consulted and performed flexible sigmoidoscopy on 3/2 which was significant for colonic ischemia. After partial colectomy, she had recurrent GI bleeding with attempted bedside colonoscopy under conscious sedation. Colonoscopy unsuccessful in identifying lesion secondary to copious blood and recommendation for total colectomy was made which was performed on 3/5.  Colonic ischemia Seen on endoscopy. General surgery consulted and performed ex lap with partial colectomy and end colostomy on 3/3. NG tube placed post-op then removed. Patient underwent total colectomy with end ileostomy on 3/5. -General surgery recommendations: Full liquid diet  Acute blood loss anemia Secondary to above. Patient has received 7 units of PRBC for GI bleeding  in addition to peri-op transfusions. Hemoglobin appears to be stable.  Hypovolemic shock Secondary to blood loss from GI bleeding. Patient required aggressive fluid/blood resuscitation in addition to short management with vasopressor support. Now resolved.  Acute respiratory failure with hypoxia Fluid overload Possibly secondary to fluid overload. Chest x-ray confirmed. Patient was net + 10 L with a maximum 15 kg weight gain. -Lasix 40 mg BID; will need to be mindful of severe AS and not over-diurese -Daily BMP -Daily weights/strict in and outs -Wean oxygen to room air  Severe aortic stenosis Seen on Transthoracic Echocardiogram. Will need outpatient cardiology follow-up.  Hyperglycemia Hemoglobin A1C of 7.4%. Would benefit from metformin on discharge if appropriate. -SSI  COPD Patient is a current smoker. COPD stable.  Primary hypertension Patient is on losartan as an outpatient which was held secondary to hypotension from acute blood loss. -Continue to hold losartan  Collagenous colitis Patient is on budesonide as an outpatient. Currently takes varying doses but most recently was taking 12 mg daily. Currently on prescribed dose of 6 mg daily -Continue budesonide 6 mg daily  Leukocytosis Possibly related to budesonide, but it seems more so secondary to ischemia. Trending down.  Hyperlipidemia -Continue Pravastatin  Thrombocytosis Likely reactive.  Underweight Severe malnutrition Weight is currently stable. Down about 10 lbs  from several years ago. -Dietitian recommendations: -Ensure Surgery po BID, each supplement provides 330 kcal and 18 grams of protein -Multivitamin with minerals daily   DVT prophylaxis: SCDs secondary to GI bleeding Code Status:   Code Status: DNR Family Communication: None at bedside Disposition Plan: Discharge home pending general surgery recommendations   Consultants:   Gastroenterology Kathleen Pope)  General surgery  Procedures:    POST MESENTERIC ARTERIOGRAM AND PERCUTANEOUS EMBOLIZATION (01/12/2021)  EGD/FLEXIBLE  SIGMOIDOSCOPY (01/15/2021)  PARTIAL COLECTOMY (01/15/2021)  Antimicrobials:  Ciprofloxacin  Flagyl   Subjective: Abdominal pain. No other concerns.  Objective: Vitals:   01/20/21 0500 01/20/21 0600 01/20/21 0800 01/20/21 1200  BP:  (!) 146/96 122/71 (!) 140/55  Pulse:  75 63 71  Resp:  16 17 14   Temp:   98 F (36.7 C) 97.7 F (36.5 C)  TempSrc:   Oral Oral  SpO2:  98% 96% 97%  Weight: 52.5 kg     Height:        Intake/Output Summary (Last 24 hours) at 01/20/2021 1240 Last data filed at 01/20/2021 0900 Gross per 24 hour  Intake 509.76 ml  Output 1390 ml  Net -880.24 ml   Filed Weights   01/18/21 0500 01/19/21 0800 01/20/21 0500  Weight: 55 kg 52.5 kg 52.5 kg    Examination:  General exam: Appears calm and comfortable Respiratory system: diminished but clear auscultation. Respiratory effort normal. Cardiovascular system: S1 & S2 heard, RRR. Gastrointestinal system: Abdomen is nondistended, soft and mildly tender. No organomegaly or masses felt. Normal bowel sounds heard. Central nervous system: Alert and oriented. No focal neurological deficits. Musculoskeletal: upper/lower extremity edema. No calf tenderness Skin: No cyanosis. No rashes Psychiatry: Judgement and insight appear normal. Mood & affect appropriate.    Data Reviewed: I have personally reviewed following labs and imaging studies  CBC Lab Results  Component Value Date   WBC 13.3 (H) 01/20/2021   RBC 3.36 (L) 01/20/2021   HGB 9.8 (L) 01/20/2021   HCT 30.5 (L) 01/20/2021   MCV 90.8 01/20/2021   MCH 29.2 01/20/2021   PLT 504 (H) 01/20/2021   MCHC 32.1 01/20/2021   RDW 16.3 (H) 01/20/2021   LYMPHSABS 1.4 01/14/2021   MONOABS 2.1 (H) 01/14/2021   EOSABS 0.1 01/14/2021   BASOSABS 0.0 73/22/0254     Last metabolic panel Lab Results  Component Value Date   NA 133 (L) 01/20/2021   K 4.6 01/20/2021   CL 95  (L) 01/20/2021   CO2 32 01/20/2021   BUN 10 01/20/2021   CREATININE 0.66 01/20/2021   GLUCOSE 109 (H) 01/20/2021   GFRNONAA >60 01/20/2021   GFRAA >90 03/10/2013   CALCIUM 7.7 (L) 01/20/2021   PHOS 4.4 01/14/2021   PROT 3.8 (L) 01/17/2021   ALBUMIN 1.7 (L) 01/17/2021   BILITOT 1.7 (H) 01/17/2021   ALKPHOS 33 (L) 01/17/2021   AST 24 01/17/2021   ALT 14 01/17/2021   ANIONGAP 6 01/20/2021    CBG (last 3)  Recent Labs    01/19/21 2107 01/20/21 0749 01/20/21 1138  GLUCAP 146* 100* 133*     GFR: Estimated Creatinine Clearance: 39.8 mL/min (by C-G formula based on SCr of 0.66 mg/dL).  Coagulation Profile: Recent Labs  Lab 01/16/21 2259  INR 1.4*    Recent Results (from the past 240 hour(s))  Resp Panel by RT-PCR (Flu A&B, Covid) Nasopharyngeal Swab     Status: None   Collection Time: 01/11/21  7:53 PM   Specimen: Nasopharyngeal Swab; Nasopharyngeal(NP) swabs in vial transport medium  Result Value Ref Range Status   SARS Coronavirus 2 by RT PCR NEGATIVE NEGATIVE Final    Comment: (NOTE) SARS-CoV-2 target nucleic acids are NOT DETECTED.  The SARS-CoV-2 RNA is generally detectable in upper respiratory specimens during the acute phase of infection. The lowest concentration of SARS-CoV-2 viral copies this assay can detect is 138 copies/mL. A negative result does not preclude SARS-Cov-2 infection and should not be used as the  sole basis for treatment or other patient management decisions. A negative result may occur with  improper specimen collection/handling, submission of specimen other than nasopharyngeal swab, presence of viral mutation(s) within the areas targeted by this assay, and inadequate number of viral copies(<138 copies/mL). A negative result must be combined with clinical observations, patient history, and epidemiological information. The expected result is Negative.  Fact Sheet for Patients:  EntrepreneurPulse.com.au  Fact Sheet for  Healthcare Providers:  IncredibleEmployment.be  This test is no t yet approved or cleared by the [place] FDA and  has been authorized for detection and/or diagnosis of SARS-CoV-2 by FDA under an Emergency Use Authorization (EUA). This EUA will remain  in effect (meaning this test can be used) for the duration of the COVID-19 declaration under Section 564(b)(1) of the Act, 21 U.S.C.section 360bbb-3(b)(1), unless the authorization is terminated  or revoked sooner.       Influenza A by PCR NEGATIVE NEGATIVE Final   Influenza B by PCR NEGATIVE NEGATIVE Final    Comment: (NOTE) The Xpert Xpress SARS-CoV-2/FLU/RSV plus assay is intended as an aid in the diagnosis of influenza from Nasopharyngeal swab specimens and should not be used as a sole basis for treatment. Nasal washings and aspirates are unacceptable for Xpert Xpress SARS-CoV-2/FLU/RSV testing.  Fact Sheet for Patients: EntrepreneurPulse.com.au  Fact Sheet for Healthcare Providers: IncredibleEmployment.be  This test is not yet approved or cleared by the [place] FDA and has been authorized for detection and/or diagnosis of SARS-CoV-2 by FDA under an Emergency Use Authorization (EUA). This EUA will remain in effect (meaning this test can be used) for the duration of the COVID-19 declaration under Section 564(b)(1) of the Act, 21 U.S.C. section 360bbb-3(b)(1), unless the authorization is terminated or revoked.  Performed at [facility], [place] [address], [place], [place] [address]   MRSA PCR Screening     Status: None   Collection Time: 01/12/21  1:47 AM   Specimen: Nasopharyngeal  Result Value Ref Range Status   MRSA by PCR NEGATIVE NEGATIVE Final    Comment:        The GeneXpert MRSA Assay (FDA approved for NASAL specimens only), is one component of a comprehensive MRSA colonization surveillance program. It is not intended to diagnose  MRSA infection nor to guide or monitor treatment for MRSA infections. Performed at [facility], [place] [address], [place], [place] [address]         Radiology Studies: No results found.      Scheduled Meds: . sodium chloride   Intravenous Once  . sodium chloride   Intravenous Once  . acetaminophen  1,000 mg Oral Q6H  . budesonide  6 mg Oral Daily  . calcium carbonate  1,250 mg Oral Daily  . Chlorhexidine Gluconate Cloth  6 each Topical Daily  . enoxaparin (LOVENOX) injection  30 mg Subcutaneous Daily  . feeding supplement  237 mL Oral BID BM  . furosemide  40 mg Intravenous BID  . insulin aspart  0-15 Units Subcutaneous TID WC  . metoprolol tartrate  25 mg Oral BID  . multivitamin with minerals  1 tablet Oral Daily  . pantoprazole (PROTONIX) IV  40 mg Intravenous Q24H  . pravastatin  10 mg Oral Daily  . sodium chloride flush  10-40 mL Intracatheter Q12H   Continuous Infusions: . sodium chloride Stopped (01/16/21 0641)  . methocarbamol (ROBAXIN) IV Stopped (01/19/21 0027)  . phenylephrine (NEO-SYNEPHRINE) Adult infusion Stopped (01/16/21 2151)  LOS: 9 days     Cordelia Poche, MD [facility] 01/20/2021, 12:40 PM  If 7PM-7AM, please contact night-coverage [email or web address]

## 2021-01-20 NOTE — Progress Notes (Signed)
ANTICOAGULATION CONSULT NOTE - Initial Consult  Pharmacy Consult for Lovenox Indication: VTE prophylaxis  No Known Allergies  Patient Measurements: Height: 5' 1.5" (156.2 cm) Weight: 52.5 kg (115 lb 11.9 oz) IBW/kg (Calculated) : 48.95 Heparin Dosing Weight: N/A  Vital Signs: Temp: 98 F (36.7 C) (03/08 0800) Temp Source: Oral (03/08 0800) BP: 122/71 (03/08 0800) Pulse Rate: 63 (03/08 0800)  Labs: Recent Labs    01/18/21 0526 01/19/21 0403 01/20/21 0706  HGB 9.1* 8.7* 9.8*  HCT 26.9* 25.9* 30.5*  PLT 290 369 504*  CREATININE 0.54 0.61 0.66    Estimated Creatinine Clearance: 39.8 mL/min (by C-G formula based on SCr of 0.66 mg/dL).   Medical History: Past Medical History:  Diagnosis Date  . Anemia    pt denies, yet takes iron  . Chronic airway obstruction, not elsewhere classified   . COPD (chronic obstructive pulmonary disease) ([place])   . Diverticulosis   . GI hemorrhage from post-treatment cecal ulcer 08/18/2012  . Hemorrhoids   . HTN (hypertension)   . Hypercholesteremia   . Lower GI bleed multiple   AVM's    Assessment: 87 y/o F s/p colectomy and ileostomy for con't GIB. Pharmacy consulted to dose Lovenox for VTE ppx. Patient with anemia requiring multiple transfusions this admission now 3 days out from latest surgery.   Goal of Therapy:  Anti-Xa level 0.2-0.4 units/ml 4hrs after LMWH dose given Monitor platelets by anticoagulation protocol: Yes   Plan:  Will initiate enoxaparin conservatively due to advanced age, recent bleeding, and recent surgery Lovenox 30 mg daily for now Will follow renal function, CBC, and s/s of bleeding   Ulice Dash D 01/20/2021,9:29 AM

## 2021-01-20 NOTE — Progress Notes (Signed)
Nutrition Follow-up  DOCUMENTATION CODES:   Underweight,Severe malnutrition in context of chronic illness  INTERVENTION:   -Ensure Surgery po BID, each supplement provides 330 kcal and 18 grams of protein  -Multivitamin with minerals daily  -Placed order for lunch  NEW NUTRITION DIAGNOSIS:   Severe Malnutrition related to chronic illness (recurrent GI bleeds) as evidenced by severe muscle depletion,energy intake < or equal to 75% for > or equal to 1 month,severe fat depletion.  GOAL:   Patient will meet greater than or equal to 90% of their needs  Progressing.  MONITOR:   PO intake,Supplement acceptance,Labs,Weight trends,I & O's  REASON FOR ASSESSMENT:   Consult Assessment of nutrition requirement/status  ASSESSMENT:   87 y.o. 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 Patient presented secondary recurrent GI bleeding.  3/2: s/p EGD, flex sig 3/3: s/p partial colectomy, colostomy 3/4: s/p colonoscopy 3/5: s/p completion of colectomy, ileostomy  Patient now on full liquids. Pt states she already ordered some pudding and grits for lunch but is also wanting potato soup as well with chocolate pudding. Reordered lunch for pt. Pt is also agreeable to trying Ensure supplements later. Will order.  Admission weight: 87 lbs Current weight: 115 lbs. -most likely fluid as pt has eaten very little.  I/Os: +8.2L since admit UOP: 640 ml x 24 hrs  Medications: OSCAL, IV Lasix  Labs reviewed: CBGs: 100-146 Low Na  NUTRITION - FOCUSED PHYSICAL EXAM:  Flowsheet Row Most Recent Value  Orbital Region Moderate depletion  Upper Arm Region Severe depletion  Thoracic and Lumbar Region Unable to assess  Buccal Region Moderate depletion  Temple Region Severe depletion  Clavicle Bone Region Severe depletion  Clavicle and Acromion Bone Region Severe depletion  Scapular Bone Region Severe  depletion  Dorsal Hand Severe depletion  Patellar Region Severe depletion  Anterior Thigh Region Unable to assess  Posterior Calf Region Severe depletion  Edema (RD Assessment) None       Diet Order:   Diet Order            Diet full liquid Room service appropriate? Yes; Fluid consistency: Thin  Diet effective now                 EDUCATION NEEDS:   Education needs have been addressed  Skin:  Skin Assessment: Reviewed RN Assessment  Last BM:  3/8 - ileostomy  Height:   Ht Readings from Last 1 Encounters:  01/14/21 5' 1.5" (1.562 m)    Weight:   Wt Readings from Last 1 Encounters:  01/20/21 52.5 kg   BMI:  Body mass index is 21.51 kg/m.  Estimated Nutritional Needs:   Kcal:  1500-1700  Protein:  70-85g  Fluid:  1.7L/day   Clayton Bibles, MS, RD, LDN Inpatient Clinical Dietitian Contact information available via Amion

## 2021-01-20 NOTE — Progress Notes (Signed)
3 Days Post-Op    CC: GI bleed  Subjective:  Patient looks good this AM.  She is sitting up in bed getting her bath.  She has output through her ileostomy.  I did not pull down the midline dressing since she was getting her bath.  Breath sounds are down in both bases.  She does not have an incentive spirometer in the room.  She is tolerating a clear liquids well and says they are going straight through.  Objective: Vital signs in last 24 hours: Temp:  [97.6 F (36.4 C)-98.5 F (36.9 C)] 98 F (36.7 C) (03/08 0800) Pulse Rate:  [60-117] 63 (03/08 0800) Resp:  [8-19] 17 (03/08 0800) BP: (108-162)/(53-113) 122/71 (03/08 0800) SpO2:  [88 %-99 %] 96 % (03/08 0800) Weight:  [52.5 kg] 52.5 kg (03/08 0500) Last BM Date: 01/20/21 (ostomy) 340 p.o. 300 IV 3090 urine No stool recorded Afebrile vital signs are stable Sodium is 133, potassium 4.6, mag was 1.5 yesterday WBC 13.3 H/H 9.8/30.5 Platelets 504,000 CXR 3/5: Pulmonary opacities/small effusion most consistent with pulmonary edema/volume overload/mild cardiomegaly  Intake/Output from previous day: 03/07 0701 - 03/08 0700 In: 646.4 [P.O.:340; I.V.:169.2; IV Piggyback:137.2] Out: 3090 [Urine:3090] Intake/Output this shift: No intake/output data recorded.  General appearance: alert, cooperative and no distress Resp: Decreased breath sounds in both bases. GI: Soft, sore, midline dressings intact.  Ileostomy is functioning well.  Lab Results:  Recent Labs    01/19/21 0403 01/20/21 0706  WBC 13.3* 13.3*  HGB 8.7* 9.8*  HCT 25.9* 30.5*  PLT 369 504*    BMET Recent Labs    01/19/21 0403 01/19/21 1346 01/20/21 0706  NA 131*  --  133*  K 2.9* 4.0 4.6  CL 94*  --  95*  CO2 31  --  32  GLUCOSE 145*  --  109*  BUN 7*  --  10  CREATININE 0.61  --  0.66  CALCIUM 6.9*  --  7.7*   PT/INR No results for input(s): LABPROT, INR in the last 72 hours.  Recent Labs  Lab 01/14/21 0519 01/16/21 0539 01/17/21 0457  AST 15  19 24   ALT 10 13 14   ALKPHOS 38 39 33*  BILITOT 1.0 0.7 1.7*  PROT 4.0* 4.6* 3.8*  ALBUMIN 2.1* 2.0* 1.7*     Lipase  No results found for: LIPASE   Medications: . sodium chloride   Intravenous Once  . sodium chloride   Intravenous Once  . acetaminophen  1,000 mg Oral Q6H  . budesonide  6 mg Oral Daily  . calcium carbonate  1,250 mg Oral Daily  . Chlorhexidine Gluconate Cloth  6 each Topical Daily  . furosemide  40 mg Intravenous BID  . insulin aspart  0-15 Units Subcutaneous TID WC  . metoprolol tartrate  25 mg Oral BID  . pantoprazole (PROTONIX) IV  40 mg Intravenous Q24H  . pravastatin  10 mg Oral Daily  . sodium chloride flush  10-40 mL Intracatheter Q12H   . sodium chloride Stopped (01/16/21 0641)  . methocarbamol (ROBAXIN) IV Stopped (01/19/21 0027)  . phenylephrine (NEO-SYNEPHRINE) Adult infusion Stopped (01/16/21 2151)    Assessment/Plan Severe aortic stenosis AVA 0.84 cm/EF 60 to 52%/WUXLK 2 diastolic dysfunction(ECHO 01/15/2021) COPD -ongoing tobacco use/59-pack-year history Acute respiratory failure with hypoxia/fluid overload Hypertension/hypotension Hx collagenous colitis -budesonide preadmit Anemia  - transfused x3 Hyperlipidemia Malnutrition/deconditioning   Ischemic colitis; ischemic mass at the splenic flexure/dense intra-abdominal adhesions S/P colonoscopy, cold snare polypectomy and AVM ablation 01/10/2021; S/P  angio embolization for GI bleed 01/12/2021 Exploratory laparotomy with partial colectomy and colostomy 01/15/2021 Dr. Erroll Luna.  POD #5 Exploratory laparotomy - completion colectomy/ileostomy 01/17/2021 Dr. Ralene Ok, POD #3  FEN: Clear liquids/IV fluids on hold ID: Cefotetan preop; Cipro/Flagyl 3/2-3/7 DVT: SCDs   Plan: Advance to full liquids/nutrition consult.  Continue to mobilize, continue wound ostomy teaching. Lovenox per pharmacy today, recheck labs in AM.  I-S every hour.   LOS: 9 days     Ellisha Bankson 01/20/2021 Please see Amion

## 2021-01-21 DIAGNOSIS — K625 Hemorrhage of anus and rectum: Secondary | ICD-10-CM | POA: Diagnosis not present

## 2021-01-21 DIAGNOSIS — E43 Unspecified severe protein-calorie malnutrition: Secondary | ICD-10-CM | POA: Insufficient documentation

## 2021-01-21 DIAGNOSIS — K5520 Angiodysplasia of colon without hemorrhage: Secondary | ICD-10-CM | POA: Diagnosis not present

## 2021-01-21 DIAGNOSIS — D62 Acute posthemorrhagic anemia: Secondary | ICD-10-CM | POA: Diagnosis not present

## 2021-01-21 LAB — GLUCOSE, CAPILLARY
Glucose-Capillary: 77 mg/dL (ref 70–99)
Glucose-Capillary: 195 mg/dL — ABNORMAL HIGH (ref 70–99)
Glucose-Capillary: 146 mg/dL — ABNORMAL HIGH (ref 70–99)
Glucose-Capillary: 118 mg/dL — ABNORMAL HIGH (ref 70–99)

## 2021-01-21 LAB — COMPREHENSIVE METABOLIC PANEL
ALT: 15 U/L (ref 0–44)
AST: 15 U/L (ref 15–41)
Albumin: 1.9 g/dL — ABNORMAL LOW (ref 3.5–5.0)
Alkaline Phosphatase: 46 U/L (ref 38–126)
Anion gap: 11 (ref 5–15)
BUN: 16 mg/dL (ref 8–23)
CO2: 30 mmol/L (ref 22–32)
Calcium: 7.8 mg/dL — ABNORMAL LOW (ref 8.9–10.3)
Chloride: 94 mmol/L — ABNORMAL LOW (ref 98–111)
Creatinine, Ser: 0.8 mg/dL (ref 0.44–1.00)
GFR, Estimated: >60 mL/min (ref >60)
Glucose, Bld: 91 mg/dL (ref 70–99)
Potassium: 4.2 mmol/L (ref 3.5–5.1)
Sodium: 135 mmol/L (ref 135–145)
Total Bilirubin: 1 mg/dL (ref 0.3–1.2)
Total Protein: 4.5 g/dL — ABNORMAL LOW (ref 6.5–8.1)

## 2021-01-21 LAB — CBC
HCT: 30.6 % — ABNORMAL LOW (ref 36.0–46.0)
Hemoglobin: 9.7 g/dL — ABNORMAL LOW (ref 12.0–15.0)
MCH: 28.8 pg (ref 26.0–34.0)
MCHC: 31.7 g/dL (ref 30.0–36.0)
MCV: 90.8 fL (ref 80.0–100.0)
Platelets: 686 10*3/uL — ABNORMAL HIGH (ref 150–400)
RBC: 3.37 MIL/uL — ABNORMAL LOW (ref 3.87–5.11)
RDW: 15.9 % — ABNORMAL HIGH (ref 11.5–15.5)
WBC: 15.9 10*3/uL — ABNORMAL HIGH (ref 4.0–10.5)
nRBC: 0.2 % (ref 0.0–0.2)

## 2021-01-21 LAB — PREALBUMIN: Prealbumin: 10.7 mg/dL — ABNORMAL LOW (ref 18–38)

## 2021-01-21 MED ORDER — PSYLLIUM 95 % PO PACK
1.0000 | PACK | Freq: Two times a day (BID) | ORAL | Status: DC
  Administered 2021-01-21 – 2021-01-24 (×7): 1 via ORAL
  Filled 2021-01-21 (×7): qty 1

## 2021-01-21 MED ORDER — FERROUS SULFATE 325 (65 FE) MG PO TABS
325.0000 mg | ORAL_TABLET | Freq: Two times a day (BID) | ORAL | Status: DC
  Administered 2021-01-21 – 2021-01-26 (×10): 325 mg via ORAL
  Filled 2021-01-21 (×10): qty 1

## 2021-01-21 NOTE — Progress Notes (Signed)
Occupational Therapy Treatment Patient Details Name: Kathleen Pope MRN: [id number] DOB: 01-11-1933 Today's Date: 01/21/2021    History of present illness Kathleen Pope is a 87 y.o. 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 Pt now s/p partial colectomy on 3/3 and Exp Lap with completion of colectomy and end ileostomy for con't GIB on 01/17/21.   OT comments  Upon arrival patient seated in recliner, agreeable to therauputic exercise while waiting for breakfast. BP taken at beginning of session 82/57 (64). Pt able to complete x5 sit to stand at supervision level, however does report dizziness. BP taken again once seated in recliner 66/57 (55) with RN notified. Pt reports feeling "a little dizzy." Breakfast arrived and deferred further mobility. Updated D/C recommendations as patient at supervision level with tranfers, will continue to follow acutely to maximize activity tolerance and independence with ADLs.    Follow Up Recommendations  Home health OT;Supervision/Assistance - 24 hour (24/7 initial)    Equipment Recommendations  None recommended by OT       Precautions / Restrictions Precautions Precautions: Fall Precaution Comments: monitor vitals, recent abd sx Restrictions Weight Bearing Restrictions: No       Mobility Bed Mobility               General bed mobility comments: OOB in recliner    Transfers Overall transfer level: Needs assistance Equipment used: None Transfers: Sit to/from Stand Sit to Stand: Supervision         General transfer comment: no physical assistance requiring to power up to standing    Balance Overall balance assessment: Needs assistance Sitting-balance support: Feet supported Sitting balance-Leahy Scale: Good     Standing balance support: No upper extremity supported Standing  balance-Leahy Scale: Fair Standing balance comment: static stand without UE assist                           ADL either performed or assessed with clinical judgement   ADL Overall ADL's : Needs assistance/impaired                         Toilet Transfer: Copy Details (indicate cue type and reason): patient perform STS activity at supervision level no physical assistance to power up to standing or loss of balance. does report dizziness pt orthostatic, RN made aware         Functional mobility during ADLs: Supervision/safety                 Cognition Arousal/Alertness: Awake/alert Behavior During Therapy: WFL for tasks assessed/performed Overall Cognitive Status: Within Functional Limits for tasks assessed                                          Exercises Exercises: Other exercises Other Exercises Other Exercises: completed 5 sit to stand from recliner however pt reporting dizziness with orthostatic BP noted at end of exercises therefore deferred further activity      General Comments BP seated in chair 82/57, post sit to stand x5 BP 66/57    Pertinent Vitals/ Pain       Pain Assessment: Faces Faces Pain Scale: Hurts a little bit Pain Location:  abdomen Pain Descriptors / Indicators: Sore;Discomfort Pain Intervention(s): Monitored during session         Frequency  Min 2X/week        Progress Toward Goals  OT Goals(current goals can now be found in the care plan section)  Progress towards OT goals: Progressing toward goals  Acute Rehab OT Goals Patient Stated Goal: Regain IND OT Goal Formulation: With patient Time For Goal Achievement: 02/01/21 Potential to Achieve Goals: Good ADL Goals Pt Will Perform Lower Body Dressing: with supervision;with adaptive equipment;sit to/from stand;sitting/lateral leans Pt Will Transfer to Toilet: with supervision;ambulating;bedside commode Pt Will  Perform Toileting - Clothing Manipulation and hygiene: with supervision;sit to/from stand;sitting/lateral leans Additional ADL Goal #1: Patient will tolerate 8 min standing activity in order to participate in self care tasks.  Plan Discharge plan needs to be updated       AM-PAC OT "6 Clicks" Daily Activity     Outcome Measure   Help from another person eating meals?: None Help from another person taking care of personal grooming?: A Little Help from another person toileting, which includes using toliet, bedpan, or urinal?: A Little Help from another person bathing (including washing, rinsing, drying)?: A Lot Help from another person to put on and taking off regular upper body clothing?: A Little Help from another person to put on and taking off regular lower body clothing?: A Lot 6 Click Score: 17    End of Session  OT Visit Diagnosis: Pain;Other abnormalities of gait and mobility (R26.89);Muscle weakness (generalized) (M62.81) Pain - Right/Left: Left Pain - part of body:  (abdomen)   Activity Tolerance Treatment limited secondary to medical complications (Comment) (orthostatic)   Patient Left in chair;with call bell/phone within reach;with nursing/sitter in room   Nurse Communication Other (comment) (orthostatic)        Time: 4818-5631 OT Time Calculation (min): 12 min  Charges: OT General Charges $OT Visit: 1 Visit OT Treatments $Therapeutic Exercise: 8-22 mins  Delbert Phenix OT OT pager: [place] 01/21/2021, 10:25 AM

## 2021-01-21 NOTE — Progress Notes (Signed)
Physical Therapy Treatment Patient Details Name: Kathleen Pope MRN: [id number] DOB: [birth year] Today's Date: 01/21/2021    History of Present Illness Kathleen Pope is a 87 y.o. 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 Pt now s/p partial colectomy on 3/3 and Exp Lap with completion of colectomy and end ileostomy for con't GIB on 01/17/21.    PT Comments    Patient is making good progress with acute PT and increased ambulation distance to ~200' today. Pt instructed on safe use of RW this session however anticipate she can progress to [facility] or gait with no AD next session. Pt had mild unsteadiness and drifting during gait requiring occasional assist to steady. Pt denied dizziness throughout and BP stable (96/57 mmHg sitting; 106/52 mmHg after gait). HR elevated to max of 120 bpm and returned to resting in 70-80's after mobilizing. Kathleen Pope will continue to benefit from skilled PT interventions to progress mobility and independence. Acute PT will progress as able.   Follow Up Recommendations  Home health PT (vs. no follow up pendign progress)     Equipment Recommendations  3in1 (PT)    Recommendations for Other Services       Precautions / Restrictions Precautions Precautions: Fall Precaution Comments: monitor vitals, recent abd sx Restrictions Weight Bearing Restrictions: No    Mobility  Bed Mobility Overal bed mobility: Needs Assistance Bed Mobility: Rolling;Sidelying to Sit Rolling: Min guard;Min assist Sidelying to sit: Min assist;HOB elevated       General bed mobility comments: cues for sequence of log roll technique.light assist to bring trunk upright from sidelying.    Transfers Overall transfer level: Needs assistance Equipment used: Rolling walker (2 wheeled) Transfers: Sit to/from Stand Sit to Stand: Min  guard;Supervision         General transfer comment: cues for hand placement with RW, no assist, guarding/supervision for safety.  Ambulation/Gait Ambulation/Gait assistance: Min assist Gait Distance (Feet): 200 Feet Assistive device: Rolling walker (2 wheeled) Gait Pattern/deviations: Decreased stride length;Step-to pattern;Shuffle;Staggering left;Staggering right Gait velocity: decr   General Gait Details: cues for safe walker management and to maintain safe proximity. min assist/guard during gait. no overt LOB however pt drifting Rt/Lt occasionally. HR 96-120 bpm as max. pt on RA and SpO2 >90% with gait (occasional desat to 80's but suspect poor read on sensor)   Stairs             Wheelchair Mobility    Modified Rankin (Stroke Patients Only)       Balance Overall balance assessment: Needs assistance Sitting-balance support: Feet supported Sitting balance-Leahy Scale: Good     Standing balance support: No upper extremity supported Standing balance-Leahy Scale: Fair Standing balance comment: static stand without UE assist                            Cognition Arousal/Alertness: Awake/alert Behavior During Therapy: WFL for tasks assessed/performed Overall Cognitive Status: Within Functional Limits for tasks assessed                                        Exercises      General Comments General comments (skin integrity, edema, etc.): BP stable 95/57 sitting EOB, 106/52 after gait while  seated in recliner.      Pertinent Vitals/Pain Pain Assessment: Faces Faces Pain Scale: Hurts a little bit Pain Location: abdomen Pain Descriptors / Indicators: Sore;Discomfort Pain Intervention(s): Limited activity within patient's tolerance;Monitored during session;Repositioned    Home Living                      Prior Function            PT Goals (current goals can now be found in the care plan section) Acute Rehab PT  Goals Patient Stated Goal: Regain IND PT Goal Formulation: With patient Time For Goal Achievement: 01/26/21 Potential to Achieve Goals: Good Progress towards PT goals: Progressing toward goals    Frequency    Min 3X/week      PT Plan Current plan remains appropriate    Co-evaluation              AM-PAC PT "6 Clicks" Mobility   Outcome Measure  Help needed turning from your back to your side while in a flat bed without using bedrails?: A Little Help needed moving from lying on your back to sitting on the side of a flat bed without using bedrails?: A Little Help needed moving to and from a bed to a chair (including a wheelchair)?: A Little Help needed standing up from a chair using your arms (e.g., wheelchair or bedside chair)?: A Little Help needed to walk in hospital room?: A Little Help needed climbing 3-5 steps with a railing? : A Lot 6 Click Score: 17    End of Session Equipment Utilized During Treatment: Gait belt Activity Tolerance: Patient tolerated treatment well Patient left: in chair;with call bell/phone within reach;with chair alarm set Nurse Communication: Mobility status PT Visit Diagnosis: Difficulty in walking, not elsewhere classified (R26.2);Unsteadiness on feet (R26.81)     Time: 7614-7092 PT Time Calculation (min) (ACUTE ONLY): 27 min  Charges:  $Gait Training: 8-22 mins $Therapeutic Activity: 8-22 mins                     Verner Mould, DPT Acute Rehabilitation Services Office [phone] Pager [phone]     Jacques Navy 01/21/2021, 5:24 PM

## 2021-01-21 NOTE — TOC Progression Note (Signed)
Transition of Care [facility]) - Progression Note    Patient Details  Name: Kathleen Pope MRN: [id number] Date of Birth: 03/12/1933  [facility] CM/SW Contact  Leeroy Cha, RN Phone Number: 01/21/2021, 8:57 AM  Clinical Narrative:    4 Days Post-Op    CC: GI bleed  Subjective: Feels good again this AM.  She wants some real food she does not like the liquids over the grits.  She had 500 after ileostomy last evening.  Ileostomy is mostly liquid now with very little substance to it.  Her midline incision looks fine.  Her BP is in the 90s, but currently she seems to be asymptomatic. plan: home with self care.   Expected Discharge Plan: Home/Self Care Barriers to Discharge: Continued Medical Work up  Expected Discharge Plan and Services Expected Discharge Plan: Home/Self Care   Discharge Planning Services: CM Consult   Living arrangements for the past 2 months: Single Family Home Expected Discharge Date:  (unknown)                                     Social Determinants of Health (SDOH) Interventions    Readmission Risk Interventions No flowsheet data found.

## 2021-01-21 NOTE — Progress Notes (Signed)
4 Days Post-Op    CC: GI bleed  Subjective: Feels good again this AM.  She wants some real food she does not like the liquids over the grits.  She had 500 after ileostomy last evening.  Ileostomy is mostly liquid now with very little substance to it.  Her midline incision looks fine.  Her BP is in the 90s, but currently she seems to be asymptomatic.  Objective: Vital signs in last 24 hours: Temp:  [97.7 F (36.5 C)-98.7 F (37.1 C)] 98.7 F (37.1 C) (03/09 0000) Pulse Rate:  [64-71] 71 (03/09 0800) Resp:  [9-16] 13 (03/09 0800) BP: (88-140)/(41-87) 92/51 (03/09 0800) SpO2:  [86 %-99 %] 98 % (03/09 0800) Weight:  [52.5 kg] 52.5 kg (03/09 0500) Last BM Date: 01/20/21 360 p.o. recorded No IV fluids recorded Urine 250 500 stool Afebrile, blood pressure down in the 90s Sats are 95 to 99% room air/nasal cannula Weight: 39.9>> 55>> 52.5 kg K+ 4.2 calcium 7.8 LFTs are normal Prealbumin 10.7 WBC 15.7  Intake/Output from previous day: 03/08 0701 - 03/09 0700 In: 360 [P.O.:360] Out: 750 [Urine:250; Stool:500] Intake/Output this shift: No intake/output data recorded.  General appearance: alert, cooperative and no distress Resp: Congested cough which she cannot quite clear.  Moving air well.  She is in previous smoker. Cardio: Tachycardic with aortic stenosis murmur. GI: Soft, midline incision looks fine with wet-to-dry dressing.  Ileostomy is working well with mostly fluid output.  Lab Results:  Recent Labs    01/20/21 0706 01/21/21 0553  WBC 13.3* 15.9*  HGB 9.8* 9.7*  HCT 30.5* 30.6*  PLT 504* 686*    BMET Recent Labs    01/20/21 0706 01/21/21 0553  NA 133* 135  K 4.6 4.2  CL 95* 94*  CO2 32 30  GLUCOSE 109* 91  BUN 10 16  CREATININE 0.66 0.80  CALCIUM 7.7* 7.8*   PT/INR No results for input(s): LABPROT, INR in the last 72 hours.  Recent Labs  Lab 01/16/21 0539 01/17/21 0457 01/21/21 0553  AST 19 24 15   ALT 13 14 15   ALKPHOS 39 33* 46  BILITOT 0.7  1.7* 1.0  PROT 4.6* 3.8* 4.5*  ALBUMIN 2.0* 1.7* 1.9*     Lipase  No results found for: LIPASE   Medications: . sodium chloride   Intravenous Once  . sodium chloride   Intravenous Once  . acetaminophen  1,000 mg Oral Q6H  . budesonide  6 mg Oral Daily  . calcium carbonate  1,250 mg Oral Daily  . Chlorhexidine Gluconate Cloth  6 each Topical Daily  . enoxaparin (LOVENOX) injection  30 mg Subcutaneous Daily  . feeding supplement  237 mL Oral BID BM  . furosemide  40 mg Intravenous BID  . insulin aspart  0-15 Units Subcutaneous TID WC  . metoprolol tartrate  25 mg Oral BID  . multivitamin with minerals  1 tablet Oral Daily  . pantoprazole (PROTONIX) IV  40 mg Intravenous Q24H  . pravastatin  10 mg Oral Daily  . sodium chloride flush  10-40 mL Intracatheter Q12H   . sodium chloride Stopped (01/16/21 0641)  . methocarbamol (ROBAXIN) IV Stopped (01/19/21 0027)  . phenylephrine (NEO-SYNEPHRINE) Adult infusion Stopped (01/16/21 2151)    Assessment/Plan Severe aortic stenosis AVA 0.84 cm/EF 60 to 00%/FVCBS 2 diastolic dysfunction(ECHO 01/15/2021)  - Weight: 39.9>> 55>> 52.5 kg COPD -ongoing tobacco use/59-pack-year history Acute respiratory failure with hypoxia/fluid overload Hypertension/hypotension Hx collagenous colitis -budesonide preadmit Anemia  - transfused x3 Hyperlipidemia  Moderate malnutrition/deconditioning  - prealbumin 10.7 Hyperglycemia  -Hemoglobin A1c 7.4   Ischemic colitis; ischemic mass at the splenic flexure/dense intra-abdominal adhesions S/P colonoscopy, cold snare polypectomy and AVM ablation 01/10/2021; S/P angio embolization for GI bleed 01/12/2021 Exploratory laparotomy with partial colectomy and colostomy 01/15/2021 Dr. Erroll Luna.  POD #6 Exploratory laparotomy - completion colectomy/ileostomy 01/17/2021 Dr. Ralene Ok, POD #4  FEN: full liquids/IV fluids on hold ID: Cefotetan preop; Cipro/Flagyl 3/2-3/7  - WBC  17.0(3/2)>>13.1>>15.0>>16.9>>13.3>>15.9(3/9) DVT: SCDs/Lovenox  Plan: Advance her to a soft diet.  We will start her FiberCon and ferrous sulfate today.  She is off antibiotics her WBC is trending up some we will follow this closely.  Blood pressures in the 90s.  She is off all IV fluids, and she has been on Lasix 40 mg twice daily.  Dr. Tana Coast will see today for the Medicine Service, will defer to Medicine on this.      LOS: 10 days    Kathleen Pope 01/21/2021 Please see Amion

## 2021-01-21 NOTE — Progress Notes (Addendum)
[facility] consult progress note                                                                               Patient Demographics  Kathleen Pope, is a 87 y.o. female, DOB - [birth year], WGN:[id number]  Admit date - 01/11/2021   Admitting Physician Elwyn Reach, MD  Outpatient Primary MD for the patient is Redmond School, MD  Outpatient specialists:   LOS - 10  days   Medical records reviewed and are as summarized below:    Chief Complaint  Patient presents with  . GI Bleeding       Brief summary   Kathleen Pope is a 87 y.o. 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 Patient presented secondary recurrent GI bleeding. Patient underwent coil embolization which unfortunately appears to have left to ischemic colon requiring partial colectomy. After partial colectomy, patient developed hypotension and brisk GI bleeding requiring total colectomy.    Assessment & Plan   Primary problem Lower GI bleeding with likely diverticular bleed, acute blood loss anemia -Presented with, recently admitted with similar episode. -Status post mesenteric arteriogram with successful coil embolization of left colic artery on 7/84 -Recurrent large-volume episode of hematochezia on 3/1 overnight.  GI was consulted again and performed a flexible sigmoidoscopy on 3/2 which showed colonic ischemia -After partial colectomy, had recurrent GI bleeding.  Colonoscopy at bedside unsuccessful in identifying the lesion secondary to previous blood and was recommended colectomy, performed on 3/5 -Received 7 units packed RBCs for GI bleeding in addition to palliative transfusions -H&H is currently stable 9.7.   Active problems Colonic ischemia -Seen on endoscopy.  General surgery consulted -Status post ex lap with partial colectomy and end colostomy on 3/3. -NG tube.,  Removed, underwent total colectomy with  end ileostomy on 3/5 -Currently tolerating diet   Hypovolemic shock, hemorrhagic shock -Secondary to blood loss from GI bleeding, requiring aggressive fluid, blood resuscitation in addition to short-term vasopressor support -Now resolved, vital signs stable  Acute respiratory failure with hypoxia secondary to fluid overload -Likely due to overload from hyper obesity transfusions, IV fluid.  Net + 10 L with maximum 15 kg weight gain -Improving, on Lasix 40 mg twice daily, patient has severe aortic stenosis, cautious diuresing and not overdiuresis -Currently on 3 L O2, wean O2 to room air, daily BMP, strict I's and O's -Still 7.5 L positive   Severe aortic stenosis Seen on Transthoracic Echocardiogram. Will need outpatient cardiology follow-up, follows Dr. Johney Frame, last seen on 12/17/2020.  Hyperglycemia -Hemoglobin A1c 7.4, for now continue SSI, will benefit from Metformin on discharge  COPD Stable, no wheezing, current smoker, counseled on nicotine cessation   Essential hypertension -BP currently soft, continue to hold losartan. -On IV Lasix 40 mg twice daily, follow closely  Collagenous colitis -Takes budesonide as an outpatient, currently on prescribed dose of 6 mg daily  Leukocytosis -Could be due to budesonide, currently no fevers  Hyperlipidemia -Continue statin  Thrombocytosis -Likely reactive, follow counts     Protein-calorie malnutrition, severe -Albumin 1.9, recurrent GI bleeding chronic illness.  BMI  21 -Continue nutritional supplements    Code Status: DNR DVT Prophylaxis:  enoxaparin (LOVENOX) injection 30 mg Start: 01/20/21 1100 Place and maintain sequential compression device Start: 01/15/21 1610 SCDs Start: 01/12/21 0237   Level of Care: Level of care: Stepdown Family Communication: Discussed all imaging results, lab results, explained to the patient    Disposition Plan:     Status is: Inpatient per surgery  Remains inpatient  appropriate because:Inpatient level of care appropriate due to severity of illness   Time Spent in minutes 35 minutes  Procedures:   POST MESENTERIC ARTERIOGRAM AND PERCUTANEOUS EMBOLIZATION (01/12/2021)  EGD/FLEXIBLE SIGMOIDOSCOPY (01/15/2021)  PARTIAL COLECTOMY (01/15/2021)   Antimicrobials:   Anti-infectives (From admission, onward)   Start     Dose/Rate Route Frequency Ordered Stop   01/15/21 1800  ciprofloxacin (CIPRO) IVPB 400 mg  Status:  Discontinued        400 mg 200 mL/hr over 60 Minutes Intravenous Every 12 hours 01/15/21 1609 01/19/21 0817   01/15/21 1700  metroNIDAZOLE (FLAGYL) IVPB 500 mg  Status:  Discontinued        500 mg 100 mL/hr over 60 Minutes Intravenous Every 8 hours 01/15/21 1609 01/19/21 0817   01/15/21 0800  cefoTEtan (CEFOTAN) 2 g in sodium chloride 0.9 % 100 mL IVPB        2 g 200 mL/hr over 30 Minutes Intravenous On call to O.R. 01/15/21 0705 01/15/21 1145          Medications  Scheduled Meds: . sodium chloride   Intravenous Once  . sodium chloride   Intravenous Once  . acetaminophen  1,000 mg Oral Q6H  . budesonide  6 mg Oral Daily  . calcium carbonate  1,250 mg Oral Daily  . Chlorhexidine Gluconate Cloth  6 each Topical Daily  . enoxaparin (LOVENOX) injection  30 mg Subcutaneous Daily  . feeding supplement  237 mL Oral BID BM  . ferrous sulfate  325 mg Oral BID WC  . furosemide  40 mg Intravenous BID  . insulin aspart  0-15 Units Subcutaneous TID WC  . metoprolol tartrate  25 mg Oral BID  . multivitamin with minerals  1 tablet Oral Daily  . pantoprazole (PROTONIX) IV  40 mg Intravenous Q24H  . pravastatin  10 mg Oral Daily  . psyllium  1 packet Oral BID  . sodium chloride flush  10-40 mL Intracatheter Q12H   Continuous Infusions: . sodium chloride Stopped (01/16/21 0641)  . methocarbamol (ROBAXIN) IV Stopped (01/19/21 0027)  . phenylephrine (NEO-SYNEPHRINE) Adult infusion Stopped (01/16/21 2151)   PRN Meds:.sodium chloride,  albuterol, lip balm, methocarbamol (ROBAXIN) IV, morphine injection, ondansetron **OR** ondansetron (ZOFRAN) IV, oxyCODONE, sodium chloride flush      Subjective:   Kathleen Pope was seen and examined today.  No acute symptoms.  BP soft but no acute dizziness or lightheadedness chest pain.  Still on 2 L O2, has abdominal pain but improving.  No active nausea vomiting  Objective:   Vitals:   01/21/21 1300 01/21/21 1330 01/21/21 1400 01/21/21 1430  BP: (!) 80/45 (!) 107/51 (!) 88/50 (!) 84/49  Pulse: 80 86 79 74  Resp: (!) 21 (!) 21 15 11   Temp:      TempSrc:      SpO2: 91% 92% 91% 93%  Weight:      Height:        Intake/Output Summary (Last 24 hours) at 01/21/2021 1554 Last data filed at 01/21/2021 0700 Gross per 24 hour  Intake --  Output 750 ml  Net -750 ml     Wt Readings from Last 3 Encounters:  01/21/21 52.5 kg  01/08/21 40.4 kg  12/29/20 40.4 kg     Exam  General: Alert and oriented x 3, NAD  Cardiovascular: S1 S2 auscultated, 3/6, systolic murmur radiating to carotids  Respiratory:  diminished breath sound at the bases  Gastrointestinal: Soft, dressing intact, ileostomy  Ext: no pedal edema bilaterally  Neuro: no new deficits  Musculoskeletal: No digital cyanosis, clubbing  Skin: No rashes  Psych: Normal affect and demeanor, alert and oriented x3    Data Reviewed:  I have personally reviewed following labs and imaging studies  Micro Results Recent Results (from the past 240 hour(s))  Resp Panel by RT-PCR (Flu A&B, Covid) Nasopharyngeal Swab     Status: None   Collection Time: 01/11/21  7:53 PM   Specimen: Nasopharyngeal Swab; Nasopharyngeal(NP) swabs in vial transport medium  Result Value Ref Range Status   SARS Coronavirus 2 by RT PCR NEGATIVE NEGATIVE Final    Comment: (NOTE) SARS-CoV-2 target nucleic acids are NOT DETECTED.  The SARS-CoV-2 RNA is generally detectable in upper respiratory specimens during the acute phase of infection. The  lowest concentration of SARS-CoV-2 viral copies this assay can detect is 138 copies/mL. A negative result does not preclude SARS-Cov-2 infection and should not be used as the sole basis for treatment or other patient management decisions. A negative result may occur with  improper specimen collection/handling, submission of specimen other than nasopharyngeal swab, presence of viral mutation(s) within the areas targeted by this assay, and inadequate number of viral copies(<138 copies/mL). A negative result must be combined with clinical observations, patient history, and epidemiological information. The expected result is Negative.  Fact Sheet for Patients:  EntrepreneurPulse.com.au  Fact Sheet for Healthcare Providers:  IncredibleEmployment.be  This test is no t yet approved or cleared by the [place] FDA and  has been authorized for detection and/or diagnosis of SARS-CoV-2 by FDA under an Emergency Use Authorization (EUA). This EUA will remain  in effect (meaning this test can be used) for the duration of the COVID-19 declaration under Section 564(b)(1) of the Act, 21 U.S.C.section 360bbb-3(b)(1), unless the authorization is terminated  or revoked sooner.       Influenza A by PCR NEGATIVE NEGATIVE Final   Influenza B by PCR NEGATIVE NEGATIVE Final    Comment: (NOTE) The Xpert Xpress SARS-CoV-2/FLU/RSV plus assay is intended as an aid in the diagnosis of influenza from Nasopharyngeal swab specimens and should not be used as a sole basis for treatment. Nasal washings and aspirates are unacceptable for Xpert Xpress SARS-CoV-2/FLU/RSV testing.  Fact Sheet for Patients: EntrepreneurPulse.com.au  Fact Sheet for Healthcare Providers: IncredibleEmployment.be  This test is not yet approved or cleared by the [place] FDA and has been authorized for detection and/or diagnosis of SARS-CoV-2 by FDA under  an Emergency Use Authorization (EUA). This EUA will remain in effect (meaning this test can be used) for the duration of the COVID-19 declaration under Section 564(b)(1) of the Act, 21 U.S.C. section 360bbb-3(b)(1), unless the authorization is terminated or revoked.  Performed at [facility], [place] [address] [place], [place] [address]   MRSA PCR Screening     Status: None   Collection Time: 01/12/21  1:47 AM   Specimen: Nasopharyngeal  Result Value Ref Range Status   MRSA by PCR NEGATIVE NEGATIVE Final    Comment:        The GeneXpert  MRSA Assay (FDA approved for NASAL specimens only), is one component of a comprehensive MRSA colonization surveillance program. It is not intended to diagnose MRSA infection nor to guide or monitor treatment for MRSA infections. Performed at [facility], [place] [address], [place], [place] [address]     Radiology Reports NM GI Blood Loss  Addendum Date: 01/08/2021   ADDENDUM REPORT: 01/08/2021 22:08 ADDENDUM: These results were discussed by telephone at the time of interpretation on 01/08/2021 at 10:07 pm to Dr. Havery Moros, who verbally acknowledged these results. Electronically Signed   By: Anner Crete M.D.   On: 01/08/2021 22:08   Result Date: 01/08/2021 CLINICAL DATA:  87 year old female with lower GI bleed. EXAM: NUCLEAR MEDICINE GASTROINTESTINAL BLEEDING SCAN TECHNIQUE: Sequential abdominal images were obtained following intravenous administration of Tc-28mlabeled red blood cells. RADIOPHARMACEUTICALS:  19.8 mCi Tc-972mertechnetate in-vitro labeled red cells. COMPARISON:  CT abdomen pelvis dated 01/08/2021. FINDINGS: There is physiologic uptake within the liver, kidneys, and cardiac blood pool. Physiologic activity noted in the major vasculature. There is radiotracer activity in the left lower quadrant conforming to the distal descending colon and sigmoid consistent with active GI bleed. Please note active  diverticular bleed in the region of the mid descending colon is present on the earlier CT. IMPRESSION: Active GI bleed in the mid to distal descending colon corresponding to the diverticular bleed on the earlier CT. Electronically Signed: By: ArAnner Crete.D. On: 01/08/2021 21:11   CT ABDOMEN PELVIS W CONTRAST  Result Date: 01/08/2021 CLINICAL DATA:  Melena. EXAM: CT ABDOMEN AND PELVIS WITH CONTRAST TECHNIQUE: Multidetector CT imaging of the abdomen and pelvis was performed using the standard protocol following bolus administration of intravenous contrast. CONTRAST:  7586mMNIPAQUE IOHEXOL 300 MG/ML  SOLN COMPARISON:  None. FINDINGS: Lower chest: Minimal left pleural effusion is noted. Bibasilar subsegmental atelectasis is noted. Hepatobiliary: No focal liver abnormality is seen. No gallstones, gallbladder wall thickening, or biliary dilatation. Pancreas: Unremarkable. No pancreatic ductal dilatation or surrounding inflammatory changes. Spleen: Normal in size without focal abnormality. Adrenals/Urinary Tract: Adrenal glands are unremarkable. Kidneys are normal, without renal calculi, focal lesion, or hydronephrosis. Bladder is unremarkable. Stomach/Bowel: The stomach and appendix appear normal. No small bowel dilatation is noted. Postsurgical changes are seen involving the descending colon. The colon is dilated and fluid-filled. Moderate amount of stool is seen throughout the colon. No definite wall thickening is noted. Vascular/Lymphatic: Aortic atherosclerosis. No enlarged abdominal or pelvic lymph nodes. Reproductive: Uterus and bilateral adnexa are unremarkable. Other: No abdominal wall hernia or abnormality. No abdominopelvic ascites. Musculoskeletal: Grade 1 anterolisthesis of L5-S1 is noted secondary to bilateral L5 spondylolysis. No acute osseous abnormality is noted. IMPRESSION: 1. Minimal left pleural effusion is noted with bibasilar subsegmental atelectasis. 2. Postsurgical changes are seen  involving the descending colon. The colon is dilated and fluid-filled. Moderate amount of stool is seen throughout the colon. No definite wall thickening is noted. 3. Grade 1 anterolisthesis of L5-S1 secondary to bilateral L5 spondylolysis. Aortic Atherosclerosis (ICD10-I70.0). Electronically Signed   By: JamMarijo ConceptionD.   On: 01/08/2021 14:20   IR Angiogram Visceral Selective  Result Date: 01/12/2021 INDICATION: Recurrent lower GI bleeding. Positive CTA. Please perform mesenteric arteriogram with potential embolization. EXAM: 1. ULTRASOUND GUIDANCE FOR ARTERIAL ACCESS 2. SELECTIVE INFERIOR MESENTERIC ARTERIOGRAM 3. SUB SELECTIVE LEFT COLIC ARTERIOGRAM 4. SUB SELECTIVE DISTAL ARCADE DIVISIONS OF THE LEFT COLIC ARTERY AND PERCUTANEOUS COIL EMBOLIZATION COMPARISON:  CTA abdomen and pelvis - 01/11/2021 Mesenteric arteriogram - 01/09/2021  Nuclear medicine tagged red blood cell bleeding scan - 01/08/2021 CT abdomen and pelvis - 01/08/2021 MEDICATIONS: None ANESTHESIA/SEDATION: Moderate (conscious) sedation was employed during this procedure. A total of Versed 0.5 mg was administered intravenously. Moderate Sedation Time: 60 minutes. The patient's level of consciousness and vital signs were monitored continuously by radiology nursing throughout the procedure under my direct supervision. CONTRAST:  45 cc Omnipaque 300 FLUOROSCOPY TIME:  19 minutes (465 mGy) COMPLICATIONS: SIR Level A - No therapy, no consequence. Procedure complicated by development of a small hematoma at the right groin access site. PROCEDURE: Informed consent was obtained from the patient following explanation of the procedure, risks, benefits and alternatives. All questions were addressed. A time out was performed prior to the initiation of the procedure. Maximal barrier sterile technique utilized including caps, mask, sterile gowns, sterile gloves, large sterile drape, hand hygiene, and Betadine prep. The right femoral head was marked  fluoroscopically. Under sterile conditions and local anesthesia, the right common femoral artery access was performed with a micropuncture needle. Under direct ultrasound guidance, the right common femoral was accessed with a micropuncture kit. An ultrasound image was saved for documentation purposes. This allowed for placement of a 5-French vascular sheath. A limited arteriogram was performed through the side arm of the sheath confirming appropriate access within the right common femoral artery. Over a Bentson wire, a Mickelson catheter was advanced the caudal aspect of the thoracic aorta where was reformed, back bled and flushed. The Mickelson catheter was then utilized to select the inferior mesenteric artery and a selective superior mesenteric arteriogram was performed. With the use of a fathom 14 microwire, a regular Renegade microcatheter was advanced to the level of the left colic artery and a selective left colic arteriogram was performed. The microcatheter was then utilized to select the inferior division of the left colic artery and a sub selective arteriogram was performed. The microcatheter was then advanced to select the distal arcade supplying the ill-defined area of extravasation involving the mid descending colon. Sub selective injection confirmed appropriate positioning and the vessel was percutaneously coil embolized with two overlapping 2 mm diameter interlock coils. The microcatheter was retracted to the level the left colic artery and a post embolization left colic arteriogram was performed. The microcatheter was then advanced to select the distal arcade felt to potentially be supplying additional collateral supply to the ill-defined area of active extravasation involving the mid descending colon. Sub selective injection confirmed appropriate positioning and the vessel was percutaneously coil embolized with two overlapping 2 mm diameter interlock coils. The microcatheter was then retracted to  the to the level pelvic artery and a selective left colic arteriogram was performed. Microcatheter was removed and a post embolization arteriogram was performed at the level of the IMA Images were reviewed and the procedure was terminated. All wires, catheters and sheaths were removed from the patient. Hemostasis was achieved at the right groin access site with manual compression. Procedure was complicated by development of a small hematoma at the right groin access site. The patient otherwise tolerated the procedure well without immediate post procedural complication. FINDINGS: Selective inferior mesenteric arteriogram demonstrates conventional branching pattern with ill-defined area of extravasation involving the mid descending colon compatible with findings on preceding CTA. Selective left colic arteriogram redemonstrates the ill-defined area of extravasation within the mid descending colon supplied predominantly via the inferior division of the left colic artery with potential collateral supply from the superior division. Sub selective arteriogram of the inferior division the left colic  artery confirms supply to the ill-defined area of extravasation and as such the supplying distal arcade was percutaneously coil embolized. Sub selective arteriogram of the superior division of the left colic artery suggests additional collateral supply to the ill-defined area of extravasation and as such the supplying distal arcade was percutaneously coil embolized. Completion left colic and inferior mesenteric arteriograms are negative for residual areas of extravasation or collateral supply to the mid descending colon. Note, superior mesenteric arteriogram was not performed given exhaustive evaluation on dedicated mesenteric arteriogram performed 3 days prior (01/09/2021). IMPRESSION: Technically successful percutaneous coil embolization of distal arcades arising from both the superior and inferior divisions of the left colic  artery supplying the ill-defined area of intraluminal contrast extravasation involving the mid descending colon compatible with the findings seen on preceding CTA. PLAN: - The patient is to remain flat for 4 hours with right leg straight (until at least 0730). - The patient will continue to experience several additional bloody bowel movements and may continue to require additional resuscitation (as the patient was bleeding both before and during the procedure), however ultimately I am hopeful she will stabilize in the coming days. Electronically Signed   By: Sandi Mariscal M.D.   On: 01/12/2021 07:24   IR Angiogram Visceral Selective  Result Date: 01/09/2021 CLINICAL DATA:  GI bleed, localized to proximal descending colon on CTA and scintigraphy EXAM: SELECTIVE VISCERAL ARTERIOGRAPHY; IR ULTRASOUND GUIDANCE VASC ACCESS RIGHT ANESTHESIA/SEDATION: Intravenous Fentanyl 18mg and Versed or 1.582mwere administered as conscious sedation during continuous monitoring of the patient's level of consciousness and physiological / cardiorespiratory status by the radiology RN, with a total moderate sedation time of 41 minutes. MEDICATIONS: Lidocaine 1% subcutaneous CONTRAST:  3054mMNIPAQUE IOHEXOL 300 MG/ML SOLN, 68m80mNIPAQUE IOHEXOL 300 MG/ML SOLN, 32mL62mIPAQUE IOHEXOL 300 MG/ML SOLN PROCEDURE: The procedure, risks (including but not limited to bleeding, infection, organ damage ), benefits, and alternatives were explained to the patient. Questions regarding the procedure were encouraged and answered. The patient understands and consents to the procedure. Right femoral region prepped and draped in usual sterile fashion. Maximal barrier sterile technique was utilized including caps, mask, sterile gowns, sterile gloves, sterile drape, hand hygiene and skin antiseptic. The right common femoral artery was localized under ultrasound. Under real-time ultrasound guidance, the vessel was accessed with a 21-gauge micropuncture  needle, exchanged over a 018 guidewire for a transitional dilator, through which a 035 guidewire was advanced. Over this, a 5 Frenc[place]ular sheath was placed, through which a 5 Frenc[place]atheter was advanced and used to selectively catheterize the superior mesenteric artery for selective arteriography in multiple projections. A coaxial Renegade microcatheter with a fathom guidewire was advanced and used for selective arteriography of 2 third order branches in multiple projections. The catheter and sheath were removed and hemostasis achieved with the aid of the Exoseal device after confirmatory femoral arteriography. The patient tolerated the procedure well. COMPLICATIONS: None immediate FINDINGS: No evidence of active extravasation, early draining vein, AVM, or other lesion to suggest a site or etiology of the patient's GI bleeding. Venous phase confirms patency of the portal venous system. IMPRESSION: 1. Negative superior mesenteric arteriogram. No evidence of active extravasation or other focal lesion to suggest etiology of GI bleed. Electronically Signed   By: D  HaLucrezia [place]   On: 01/09/2021 09:46   IR Angiogram Selective Each Additional Vessel  Result Date: 01/12/2021 INDICATION: Recurrent lower GI bleeding. Positive CTA. Please perform mesenteric arteriogram with potential embolization. EXAM: 1. ULTRASOUND  GUIDANCE FOR ARTERIAL ACCESS 2. SELECTIVE INFERIOR MESENTERIC ARTERIOGRAM 3. SUB SELECTIVE LEFT COLIC ARTERIOGRAM 4. SUB SELECTIVE DISTAL ARCADE DIVISIONS OF THE LEFT COLIC ARTERY AND PERCUTANEOUS COIL EMBOLIZATION COMPARISON:  CTA abdomen and pelvis - 01/11/2021 Mesenteric arteriogram - 01/09/2021 Nuclear medicine tagged red blood cell bleeding scan - 01/08/2021 CT abdomen and pelvis - 01/08/2021 MEDICATIONS: None ANESTHESIA/SEDATION: Moderate (conscious) sedation was employed during this procedure. A total of Versed 0.5 mg was administered intravenously. Moderate Sedation Time: 60 minutes. The  patient's level of consciousness and vital signs were monitored continuously by radiology nursing throughout the procedure under my direct supervision. CONTRAST:  45 cc Omnipaque 300 FLUOROSCOPY TIME:  19 minutes (426 mGy) COMPLICATIONS: SIR Level A - No therapy, no consequence. Procedure complicated by development of a small hematoma at the right groin access site. PROCEDURE: Informed consent was obtained from the patient following explanation of the procedure, risks, benefits and alternatives. All questions were addressed. A time out was performed prior to the initiation of the procedure. Maximal barrier sterile technique utilized including caps, mask, sterile gowns, sterile gloves, large sterile drape, hand hygiene, and Betadine prep. The right femoral head was marked fluoroscopically. Under sterile conditions and local anesthesia, the right common femoral artery access was performed with a micropuncture needle. Under direct ultrasound guidance, the right common femoral was accessed with a micropuncture kit. An ultrasound image was saved for documentation purposes. This allowed for placement of a 5-French vascular sheath. A limited arteriogram was performed through the side arm of the sheath confirming appropriate access within the right common femoral artery. Over a Bentson wire, a Mickelson catheter was advanced the caudal aspect of the thoracic aorta where was reformed, back bled and flushed. The Mickelson catheter was then utilized to select the inferior mesenteric artery and a selective superior mesenteric arteriogram was performed. With the use of a fathom 14 microwire, a regular Renegade microcatheter was advanced to the level of the left colic artery and a selective left colic arteriogram was performed. The microcatheter was then utilized to select the inferior division of the left colic artery and a sub selective arteriogram was performed. The microcatheter was then advanced to select the distal arcade  supplying the ill-defined area of extravasation involving the mid descending colon. Sub selective injection confirmed appropriate positioning and the vessel was percutaneously coil embolized with two overlapping 2 mm diameter interlock coils. The microcatheter was retracted to the level the left colic artery and a post embolization left colic arteriogram was performed. The microcatheter was then advanced to select the distal arcade felt to potentially be supplying additional collateral supply to the ill-defined area of active extravasation involving the mid descending colon. Sub selective injection confirmed appropriate positioning and the vessel was percutaneously coil embolized with two overlapping 2 mm diameter interlock coils. The microcatheter was then retracted to the to the level pelvic artery and a selective left colic arteriogram was performed. Microcatheter was removed and a post embolization arteriogram was performed at the level of the IMA Images were reviewed and the procedure was terminated. All wires, catheters and sheaths were removed from the patient. Hemostasis was achieved at the right groin access site with manual compression. Procedure was complicated by development of a small hematoma at the right groin access site. The patient otherwise tolerated the procedure well without immediate post procedural complication. FINDINGS: Selective inferior mesenteric arteriogram demonstrates conventional branching pattern with ill-defined area of extravasation involving the mid descending colon compatible with findings on preceding CTA. Selective left  colic arteriogram redemonstrates the ill-defined area of extravasation within the mid descending colon supplied predominantly via the inferior division of the left colic artery with potential collateral supply from the superior division. Sub selective arteriogram of the inferior division the left colic artery confirms supply to the ill-defined area of  extravasation and as such the supplying distal arcade was percutaneously coil embolized. Sub selective arteriogram of the superior division of the left colic artery suggests additional collateral supply to the ill-defined area of extravasation and as such the supplying distal arcade was percutaneously coil embolized. Completion left colic and inferior mesenteric arteriograms are negative for residual areas of extravasation or collateral supply to the mid descending colon. Note, superior mesenteric arteriogram was not performed given exhaustive evaluation on dedicated mesenteric arteriogram performed 3 days prior (01/09/2021). IMPRESSION: Technically successful percutaneous coil embolization of distal arcades arising from both the superior and inferior divisions of the left colic artery supplying the ill-defined area of intraluminal contrast extravasation involving the mid descending colon compatible with the findings seen on preceding CTA. PLAN: - The patient is to remain flat for 4 hours with right leg straight (until at least 0730). - The patient will continue to experience several additional bloody bowel movements and may continue to require additional resuscitation (as the patient was bleeding both before and during the procedure), however ultimately I am hopeful she will stabilize in the coming days. Electronically Signed   By: Sandi Mariscal M.D.   On: 01/12/2021 07:24   IR Angiogram Selective Each Additional Vessel  Result Date: 01/12/2021 INDICATION: Recurrent lower GI bleeding. Positive CTA. Please perform mesenteric arteriogram with potential embolization. EXAM: 1. ULTRASOUND GUIDANCE FOR ARTERIAL ACCESS 2. SELECTIVE INFERIOR MESENTERIC ARTERIOGRAM 3. SUB SELECTIVE LEFT COLIC ARTERIOGRAM 4. SUB SELECTIVE DISTAL ARCADE DIVISIONS OF THE LEFT COLIC ARTERY AND PERCUTANEOUS COIL EMBOLIZATION COMPARISON:  CTA abdomen and pelvis - 01/11/2021 Mesenteric arteriogram - 01/09/2021 Nuclear medicine tagged red blood  cell bleeding scan - 01/08/2021 CT abdomen and pelvis - 01/08/2021 MEDICATIONS: None ANESTHESIA/SEDATION: Moderate (conscious) sedation was employed during this procedure. A total of Versed 0.5 mg was administered intravenously. Moderate Sedation Time: 60 minutes. The patient's level of consciousness and vital signs were monitored continuously by radiology nursing throughout the procedure under my direct supervision. CONTRAST:  45 cc Omnipaque 300 FLUOROSCOPY TIME:  19 minutes (440 mGy) COMPLICATIONS: SIR Level A - No therapy, no consequence. Procedure complicated by development of a small hematoma at the right groin access site. PROCEDURE: Informed consent was obtained from the patient following explanation of the procedure, risks, benefits and alternatives. All questions were addressed. A time out was performed prior to the initiation of the procedure. Maximal barrier sterile technique utilized including caps, mask, sterile gowns, sterile gloves, large sterile drape, hand hygiene, and Betadine prep. The right femoral head was marked fluoroscopically. Under sterile conditions and local anesthesia, the right common femoral artery access was performed with a micropuncture needle. Under direct ultrasound guidance, the right common femoral was accessed with a micropuncture kit. An ultrasound image was saved for documentation purposes. This allowed for placement of a 5-French vascular sheath. A limited arteriogram was performed through the side arm of the sheath confirming appropriate access within the right common femoral artery. Over a Bentson wire, a Mickelson catheter was advanced the caudal aspect of the thoracic aorta where was reformed, back bled and flushed. The Mickelson catheter was then utilized to select the inferior mesenteric artery and a selective superior mesenteric arteriogram was performed. With the  use of a fathom 14 microwire, a regular Renegade microcatheter was advanced to the level of the left  colic artery and a selective left colic arteriogram was performed. The microcatheter was then utilized to select the inferior division of the left colic artery and a sub selective arteriogram was performed. The microcatheter was then advanced to select the distal arcade supplying the ill-defined area of extravasation involving the mid descending colon. Sub selective injection confirmed appropriate positioning and the vessel was percutaneously coil embolized with two overlapping 2 mm diameter interlock coils. The microcatheter was retracted to the level the left colic artery and a post embolization left colic arteriogram was performed. The microcatheter was then advanced to select the distal arcade felt to potentially be supplying additional collateral supply to the ill-defined area of active extravasation involving the mid descending colon. Sub selective injection confirmed appropriate positioning and the vessel was percutaneously coil embolized with two overlapping 2 mm diameter interlock coils. The microcatheter was then retracted to the to the level pelvic artery and a selective left colic arteriogram was performed. Microcatheter was removed and a post embolization arteriogram was performed at the level of the IMA Images were reviewed and the procedure was terminated. All wires, catheters and sheaths were removed from the patient. Hemostasis was achieved at the right groin access site with manual compression. Procedure was complicated by development of a small hematoma at the right groin access site. The patient otherwise tolerated the procedure well without immediate post procedural complication. FINDINGS: Selective inferior mesenteric arteriogram demonstrates conventional branching pattern with ill-defined area of extravasation involving the mid descending colon compatible with findings on preceding CTA. Selective left colic arteriogram redemonstrates the ill-defined area of extravasation within the mid  descending colon supplied predominantly via the inferior division of the left colic artery with potential collateral supply from the superior division. Sub selective arteriogram of the inferior division the left colic artery confirms supply to the ill-defined area of extravasation and as such the supplying distal arcade was percutaneously coil embolized. Sub selective arteriogram of the superior division of the left colic artery suggests additional collateral supply to the ill-defined area of extravasation and as such the supplying distal arcade was percutaneously coil embolized. Completion left colic and inferior mesenteric arteriograms are negative for residual areas of extravasation or collateral supply to the mid descending colon. Note, superior mesenteric arteriogram was not performed given exhaustive evaluation on dedicated mesenteric arteriogram performed 3 days prior (01/09/2021). IMPRESSION: Technically successful percutaneous coil embolization of distal arcades arising from both the superior and inferior divisions of the left colic artery supplying the ill-defined area of intraluminal contrast extravasation involving the mid descending colon compatible with the findings seen on preceding CTA. PLAN: - The patient is to remain flat for 4 hours with right leg straight (until at least 0730). - The patient will continue to experience several additional bloody bowel movements and may continue to require additional resuscitation (as the patient was bleeding both before and during the procedure), however ultimately I am hopeful she will stabilize in the coming days. Electronically Signed   By: Sandi Mariscal M.D.   On: 01/12/2021 07:24   IR Angiogram Selective Each Additional Vessel  Result Date: 01/12/2021 INDICATION: Recurrent lower GI bleeding. Positive CTA. Please perform mesenteric arteriogram with potential embolization. EXAM: 1. ULTRASOUND GUIDANCE FOR ARTERIAL ACCESS 2. SELECTIVE INFERIOR MESENTERIC  ARTERIOGRAM 3. SUB SELECTIVE LEFT COLIC ARTERIOGRAM 4. SUB SELECTIVE DISTAL ARCADE DIVISIONS OF THE LEFT COLIC ARTERY AND PERCUTANEOUS COIL EMBOLIZATION COMPARISON:  CTA abdomen and pelvis - 01/11/2021 Mesenteric arteriogram - 01/09/2021 Nuclear medicine tagged red blood cell bleeding scan - 01/08/2021 CT abdomen and pelvis - 01/08/2021 MEDICATIONS: None ANESTHESIA/SEDATION: Moderate (conscious) sedation was employed during this procedure. A total of Versed 0.5 mg was administered intravenously. Moderate Sedation Time: 60 minutes. The patient's level of consciousness and vital signs were monitored continuously by radiology nursing throughout the procedure under my direct supervision. CONTRAST:  45 cc Omnipaque 300 FLUOROSCOPY TIME:  19 minutes (488 mGy) COMPLICATIONS: SIR Level A - No therapy, no consequence. Procedure complicated by development of a small hematoma at the right groin access site. PROCEDURE: Informed consent was obtained from the patient following explanation of the procedure, risks, benefits and alternatives. All questions were addressed. A time out was performed prior to the initiation of the procedure. Maximal barrier sterile technique utilized including caps, mask, sterile gowns, sterile gloves, large sterile drape, hand hygiene, and Betadine prep. The right femoral head was marked fluoroscopically. Under sterile conditions and local anesthesia, the right common femoral artery access was performed with a micropuncture needle. Under direct ultrasound guidance, the right common femoral was accessed with a micropuncture kit. An ultrasound image was saved for documentation purposes. This allowed for placement of a 5-French vascular sheath. A limited arteriogram was performed through the side arm of the sheath confirming appropriate access within the right common femoral artery. Over a Bentson wire, a Mickelson catheter was advanced the caudal aspect of the thoracic aorta where was reformed, back bled  and flushed. The Mickelson catheter was then utilized to select the inferior mesenteric artery and a selective superior mesenteric arteriogram was performed. With the use of a fathom 14 microwire, a regular Renegade microcatheter was advanced to the level of the left colic artery and a selective left colic arteriogram was performed. The microcatheter was then utilized to select the inferior division of the left colic artery and a sub selective arteriogram was performed. The microcatheter was then advanced to select the distal arcade supplying the ill-defined area of extravasation involving the mid descending colon. Sub selective injection confirmed appropriate positioning and the vessel was percutaneously coil embolized with two overlapping 2 mm diameter interlock coils. The microcatheter was retracted to the level the left colic artery and a post embolization left colic arteriogram was performed. The microcatheter was then advanced to select the distal arcade felt to potentially be supplying additional collateral supply to the ill-defined area of active extravasation involving the mid descending colon. Sub selective injection confirmed appropriate positioning and the vessel was percutaneously coil embolized with two overlapping 2 mm diameter interlock coils. The microcatheter was then retracted to the to the level pelvic artery and a selective left colic arteriogram was performed. Microcatheter was removed and a post embolization arteriogram was performed at the level of the IMA Images were reviewed and the procedure was terminated. All wires, catheters and sheaths were removed from the patient. Hemostasis was achieved at the right groin access site with manual compression. Procedure was complicated by development of a small hematoma at the right groin access site. The patient otherwise tolerated the procedure well without immediate post procedural complication. FINDINGS: Selective inferior mesenteric arteriogram  demonstrates conventional branching pattern with ill-defined area of extravasation involving the mid descending colon compatible with findings on preceding CTA. Selective left colic arteriogram redemonstrates the ill-defined area of extravasation within the mid descending colon supplied predominantly via the inferior division of the left colic artery with potential collateral supply from the superior division.  Sub selective arteriogram of the inferior division the left colic artery confirms supply to the ill-defined area of extravasation and as such the supplying distal arcade was percutaneously coil embolized. Sub selective arteriogram of the superior division of the left colic artery suggests additional collateral supply to the ill-defined area of extravasation and as such the supplying distal arcade was percutaneously coil embolized. Completion left colic and inferior mesenteric arteriograms are negative for residual areas of extravasation or collateral supply to the mid descending colon. Note, superior mesenteric arteriogram was not performed given exhaustive evaluation on dedicated mesenteric arteriogram performed 3 days prior (01/09/2021). IMPRESSION: Technically successful percutaneous coil embolization of distal arcades arising from both the superior and inferior divisions of the left colic artery supplying the ill-defined area of intraluminal contrast extravasation involving the mid descending colon compatible with the findings seen on preceding CTA. PLAN: - The patient is to remain flat for 4 hours with right leg straight (until at least 0730). - The patient will continue to experience several additional bloody bowel movements and may continue to require additional resuscitation (as the patient was bleeding both before and during the procedure), however ultimately I am hopeful she will stabilize in the coming days. Electronically Signed   By: Sandi Mariscal M.D.   On: 01/12/2021 07:24   IR US Guide Vasc  Access Right  Result Date: 01/12/2021 INDICATION: Recurrent lower GI bleeding. Positive CTA. Please perform mesenteric arteriogram with potential embolization. EXAM: 1. ULTRASOUND GUIDANCE FOR ARTERIAL ACCESS 2. SELECTIVE INFERIOR MESENTERIC ARTERIOGRAM 3. SUB SELECTIVE LEFT COLIC ARTERIOGRAM 4. SUB SELECTIVE DISTAL ARCADE DIVISIONS OF THE LEFT COLIC ARTERY AND PERCUTANEOUS COIL EMBOLIZATION COMPARISON:  CTA abdomen and pelvis - 01/11/2021 Mesenteric arteriogram - 01/09/2021 Nuclear medicine tagged red blood cell bleeding scan - 01/08/2021 CT abdomen and pelvis - 01/08/2021 MEDICATIONS: None ANESTHESIA/SEDATION: Moderate (conscious) sedation was employed during this procedure. A total of Versed 0.5 mg was administered intravenously. Moderate Sedation Time: 60 minutes. The patient's level of consciousness and vital signs were monitored continuously by radiology nursing throughout the procedure under my direct supervision. CONTRAST:  45 cc Omnipaque 300 FLUOROSCOPY TIME:  19 minutes (427 mGy) COMPLICATIONS: SIR Level A - No therapy, no consequence. Procedure complicated by development of a small hematoma at the right groin access site. PROCEDURE: Informed consent was obtained from the patient following explanation of the procedure, risks, benefits and alternatives. All questions were addressed. A time out was performed prior to the initiation of the procedure. Maximal barrier sterile technique utilized including caps, mask, sterile gowns, sterile gloves, large sterile drape, hand hygiene, and Betadine prep. The right femoral head was marked fluoroscopically. Under sterile conditions and local anesthesia, the right common femoral artery access was performed with a micropuncture needle. Under direct ultrasound guidance, the right common femoral was accessed with a micropuncture kit. An ultrasound image was saved for documentation purposes. This allowed for placement of a 5-French vascular sheath. A limited arteriogram  was performed through the side arm of the sheath confirming appropriate access within the right common femoral artery. Over a Bentson wire, a Mickelson catheter was advanced the caudal aspect of the thoracic aorta where was reformed, back bled and flushed. The Mickelson catheter was then utilized to select the inferior mesenteric artery and a selective superior mesenteric arteriogram was performed. With the use of a fathom 14 microwire, a regular Renegade microcatheter was advanced to the level of the left colic artery and a selective left colic arteriogram was performed. The microcatheter was then  utilized to select the inferior division of the left colic artery and a sub selective arteriogram was performed. The microcatheter was then advanced to select the distal arcade supplying the ill-defined area of extravasation involving the mid descending colon. Sub selective injection confirmed appropriate positioning and the vessel was percutaneously coil embolized with two overlapping 2 mm diameter interlock coils. The microcatheter was retracted to the level the left colic artery and a post embolization left colic arteriogram was performed. The microcatheter was then advanced to select the distal arcade felt to potentially be supplying additional collateral supply to the ill-defined area of active extravasation involving the mid descending colon. Sub selective injection confirmed appropriate positioning and the vessel was percutaneously coil embolized with two overlapping 2 mm diameter interlock coils. The microcatheter was then retracted to the to the level pelvic artery and a selective left colic arteriogram was performed. Microcatheter was removed and a post embolization arteriogram was performed at the level of the IMA Images were reviewed and the procedure was terminated. All wires, catheters and sheaths were removed from the patient. Hemostasis was achieved at the right groin access site with manual compression.  Procedure was complicated by development of a small hematoma at the right groin access site. The patient otherwise tolerated the procedure well without immediate post procedural complication. FINDINGS: Selective inferior mesenteric arteriogram demonstrates conventional branching pattern with ill-defined area of extravasation involving the mid descending colon compatible with findings on preceding CTA. Selective left colic arteriogram redemonstrates the ill-defined area of extravasation within the mid descending colon supplied predominantly via the inferior division of the left colic artery with potential collateral supply from the superior division. Sub selective arteriogram of the inferior division the left colic artery confirms supply to the ill-defined area of extravasation and as such the supplying distal arcade was percutaneously coil embolized. Sub selective arteriogram of the superior division of the left colic artery suggests additional collateral supply to the ill-defined area of extravasation and as such the supplying distal arcade was percutaneously coil embolized. Completion left colic and inferior mesenteric arteriograms are negative for residual areas of extravasation or collateral supply to the mid descending colon. Note, superior mesenteric arteriogram was not performed given exhaustive evaluation on dedicated mesenteric arteriogram performed 3 days prior (01/09/2021). IMPRESSION: Technically successful percutaneous coil embolization of distal arcades arising from both the superior and inferior divisions of the left colic artery supplying the ill-defined area of intraluminal contrast extravasation involving the mid descending colon compatible with the findings seen on preceding CTA. PLAN: - The patient is to remain flat for 4 hours with right leg straight (until at least 0730). - The patient will continue to experience several additional bloody bowel movements and may continue to require additional  resuscitation (as the patient was bleeding both before and during the procedure), however ultimately I am hopeful she will stabilize in the coming days. Electronically Signed   By: Sandi Mariscal M.D.   On: 01/12/2021 07:24   IR US Guide Vasc Access Right  Result Date: 01/09/2021 CLINICAL DATA:  GI bleed, localized to proximal descending colon on CTA and scintigraphy EXAM: SELECTIVE VISCERAL ARTERIOGRAPHY; IR ULTRASOUND GUIDANCE VASC ACCESS RIGHT ANESTHESIA/SEDATION: Intravenous Fentanyl 98mg and Versed or 1.539mwere administered as conscious sedation during continuous monitoring of the patient's level of consciousness and physiological / cardiorespiratory status by the radiology RN, with a total moderate sedation time of 41 minutes. MEDICATIONS: Lidocaine 1% subcutaneous CONTRAST:  3023mMNIPAQUE IOHEXOL 300 MG/ML SOLN, 55m1mNIPAQUE IOHEXOL 300 MG/ML  SOLN, 38m OMNIPAQUE IOHEXOL 300 MG/ML SOLN PROCEDURE: The procedure, risks (including but not limited to bleeding, infection, organ damage ), benefits, and alternatives were explained to the patient. Questions regarding the procedure were encouraged and answered. The patient understands and consents to the procedure. Right femoral region prepped and draped in usual sterile fashion. Maximal barrier sterile technique was utilized including caps, mask, sterile gowns, sterile gloves, sterile drape, hand hygiene and skin antiseptic. The right common femoral artery was localized under ultrasound. Under real-time ultrasound guidance, the vessel was accessed with a 21-gauge micropuncture needle, exchanged over a 018 guidewire for a transitional dilator, through which a 035 guidewire was advanced. Over this, a 5 [place] sheath was placed, through which a 5 [place]2 catheter was advanced and used to selectively catheterize the superior mesenteric artery for selective arteriography in multiple projections. A coaxial Renegade microcatheter with a fathom guidewire was  advanced and used for selective arteriography of 2 third order branches in multiple projections. The catheter and sheath were removed and hemostasis achieved with the aid of the Exoseal device after confirmatory femoral arteriography. The patient tolerated the procedure well. COMPLICATIONS: None immediate FINDINGS: No evidence of active extravasation, early draining vein, AVM, or other lesion to suggest a site or etiology of the patient's GI bleeding. Venous phase confirms patency of the portal venous system. IMPRESSION: 1. Negative superior mesenteric arteriogram. No evidence of active extravasation or other focal lesion to suggest etiology of GI bleed. Electronically Signed   By: DLucrezia EuropeM.D.   On: 01/09/2021 09:46   DG CHEST PORT 1 VIEW  Result Date: 01/17/2021 CLINICAL DATA:  Recurrent bleed.  Shortness of breath. EXAM: PORTABLE CHEST 1 VIEW COMPARISON:  January 16, 2021 FINDINGS: Diffuse increased hazy and interstitial opacities with bilateral small effusions, unchanged. A left central line terminates in the central SVC. The cardiomediastinal silhouette is unchanged. No pneumothorax. IMPRESSION: The pulmonary opacities and small effusions are most consistent with pulmonary edema/volume overload with mild cardiomegaly. An atypical infection is not completely excluded. Recommend clinical correlation. Electronically Signed   By: DDorise BullionIII M.D   On: 01/17/2021 15:58   DG CHEST PORT 1 VIEW  Result Date: 01/16/2021 CLINICAL DATA:  Central catheter placement EXAM: PORTABLE CHEST 1 VIEW COMPARISON:  CT angio abdomen and pelvis 01/11/2021 FINDINGS: Left subclavian approach central venous catheter tip terminates in the mid SVC. Telemetry leads and external support devices overlie the chest. Low lung volumes and atelectasis. Layering bilateral effusions. Some patchy and streaky opacities are present throughout both lungs, right greater than left. No visible pneumothorax. The aorta is calcified. The  remaining cardiomediastinal contours are unremarkable. No acute osseous or soft tissue abnormality. IMPRESSION: Left subclavian approach central venous catheter tip terminates in the mid SVC. Developing bilateral effusions. Heterogeneous opacities in the lungs, could reflect developing infection and/or edema. Electronically Signed   By: PLovena LeM.D.   On: 01/16/2021 21:10   ECHOCARDIOGRAM COMPLETE  Result Date: 01/15/2021    ECHOCARDIOGRAM REPORT   Patient Name:   PEMBERLIE GOTCHERDate of Exam: 01/15/2021 Medical Rec #:  [id number]   Height:       61.5 in Accession #:    [id number]  Weight:       88.0 lb Date of Birth:  4May 09, 1934    BSA:          1.340 m Patient Age:    89years     BP:  127/78 mmHg Patient Gender: F            HR:           83 bpm. Exam Location:  Inpatient Procedure: 2D Echo, Color Doppler, Cardiac Doppler and 3D Echo Indications:    Other Cardiac Sounds R01.2; Z01.818 Encounter for other                 preprocedural examination  History:        Patient has prior history of Echocardiogram examinations, most                 recent 08/07/2012. COPD; Risk Factors:Hypertension and                 Dyslipidemia.  Sonographer:    Jonelle Sidle Dance Referring Phys: [id number] ELIZABETH [place]  1. There is severe aortic stenosis with AVA 0.84cm2, mean gradient 54 mmHg, peak gradient 32mHg, Vmax 4.820m, DI 0.27.  2. The aortic valve is calcified. There is severe calcifcation of the aortic valve. There is severe thickening of the aortic valve. Aortic valve regurgitation is mild.  3. Left ventricular ejection fraction, by estimation, is 60 to 65%. The left ventricle has normal function. The left ventricle has no regional wall motion abnormalities.  4. There is severe hypertrophy of the basal septum measuring 1.8cm. The rest of the LV segments demonstrate moderate hypertrophy. An intracavitary gradient is present with no evidence of SAM.  5. Left ventricular diastolic parameters are  consistent with Grade II diastolic dysfunction (pseudonormalization).  6. Elevated left atrial pressure.  7. Right ventricular systolic function is normal. The right ventricular size is normal.  8. Left atrial size was moderately dilated.  9. There is moderate thickening of the mitral valve leaflet(s). There is mild calcification of the mitral valve leaflet(s). Mild mitral annular calcification. Mild mitral valve regurgitation. 10. The inferior vena cava is dilated in size with >50% respiratory variability, suggesting right atrial pressure of 8 mmHg. Comparison(s): Compared to echo report in 2013, there is now severe aortic stenosis. FINDINGS  Left Ventricle: Left ventricular ejection fraction, by estimation, is 65 to 70%. The left ventricle has normal function. The left ventricle has no regional wall motion abnormalities. The left ventricular internal cavity size was normal in size. There is  servere hypertrophy of the basal septal segments. The rest of the LV segments demonstrate moderate hypertrophy. Left ventricular diastolic parameters are consistent with Grade II diastolic dysfunction (pseudonormalization). Elevated left atrial pressure. The E/e' is 2836Right Ventricle: The right ventricular size is normal. No increase in right ventricular wall thickness. Right ventricular systolic function is normal. Left Atrium: Left atrial size was moderately dilated. Right Atrium: Right atrial size was normal in size. Pericardium: Trivial pericardial effusion is present. Mitral Valve: The mitral valve is abnormal. There is moderate thickening of the mitral valve leaflet(s). There is mild calcification of the mitral valve leaflet(s). Mild mitral annular calcification. Mild mitral valve regurgitation. Tricuspid Valve: The tricuspid valve is normal in structure. Tricuspid valve regurgitation is trivial. Aortic Valve: There is severe aortic stenosis with AVA 0.84cm2, mean gradient 54 mmHg, peak gradient 9340m, Vmax 4.3m/94mDI  0.27. The aortic valve is calcified. There is severe calcifcation of the aortic valve. There is severe thickening of the aortic valve. Aortic valve regurgitation is mild. Aortic regurgitation PHT measures 373 msec. Pulmonic Valve: The pulmonic valve was normal in structure. Pulmonic valve regurgitation is trivial. Aorta: The aortic root and ascending aorta are structurally  normal, with no evidence of dilitation. Venous: The inferior vena cava is dilated in size with greater than 50% respiratory variability, suggesting right atrial pressure of 8 mmHg. IAS/Shunts: No atrial level shunt detected by color flow Doppler.  LEFT VENTRICLE PLAX 2D LVIDd:         3.30 cm  Diastology LVIDs:         2.10 cm  LV e' medial:    5.66 cm/s LV PW:         1.30 cm  LV E/e' medial:  28.6 LV IVS:        1.20 cm  LV e' lateral:   4.35 cm/s LVOT diam:     2.00 cm  LV E/e' lateral: 37.2 LV SV:         83 LV SV Index:   62 LVOT Area:     3.14 cm  RIGHT VENTRICLE             IVC RV Basal diam:  2.30 cm     IVC diam: 2.30 cm RV S prime:     14.10 cm/s TAPSE (M-mode): 1.6 cm LEFT ATRIUM             Index       RIGHT ATRIUM          Index LA diam:        3.70 cm 2.76 cm/m  RA Area:     9.22 cm LA Vol (A2C):   36.5 ml 27.23 ml/m RA Volume:   16.60 ml 12.39 ml/m LA Vol (A4C):   56.0 ml 41.78 ml/m LA Biplane Vol: 46.2 ml 34.47 ml/m  AORTIC VALVE AV Area (Vmax):    0.84 cm AV Area (Vmean):   0.86 cm AV Area (VTI):     0.93 cm AV Vmax:           424.00 cm/s AV Vmean:          307.200 cm/s AV VTI:            0.885 m AV Peak Grad:      71.9 mmHg AV Mean Grad:      43.6 mmHg LVOT Vmax:         114.00 cm/s LVOT Vmean:        84.300 cm/s LVOT VTI:          0.263 m LVOT/AV VTI ratio: 0.30 AI PHT:            373 msec  AORTA Ao Root diam: 3.20 cm Ao Asc diam:  3.10 cm MITRAL VALVE MV Area (PHT): 2.54 cm     SHUNTS MV Decel Time: 299 msec     Systemic VTI:  0.26 m MV E velocity: 162.00 cm/s  Systemic Diam: 2.00 cm MV A velocity: 145.00 cm/s MV E/A  ratio:  1.12 Gwyndolyn Kaufman MD Electronically signed by Gwyndolyn Kaufman MD Signature Date/Time: 01/15/2021/10:43:34 AM    Final    IR EMBO ARTERIAL NOT HEMORR HEMANG INC GUIDE ROADMAPPING  Result Date: 01/12/2021 INDICATION: Recurrent lower GI bleeding. Positive CTA. Please perform mesenteric arteriogram with potential embolization. EXAM: 1. ULTRASOUND GUIDANCE FOR ARTERIAL ACCESS 2. SELECTIVE INFERIOR MESENTERIC ARTERIOGRAM 3. SUB SELECTIVE LEFT COLIC ARTERIOGRAM 4. SUB SELECTIVE DISTAL ARCADE DIVISIONS OF THE LEFT COLIC ARTERY AND PERCUTANEOUS COIL EMBOLIZATION COMPARISON:  CTA abdomen and pelvis - 01/11/2021 Mesenteric arteriogram - 01/09/2021 Nuclear medicine tagged red blood cell bleeding scan - 01/08/2021 CT abdomen and pelvis - 01/08/2021 MEDICATIONS: None ANESTHESIA/SEDATION: Moderate (conscious) sedation  was employed during this procedure. A total of Versed 0.5 mg was administered intravenously. Moderate Sedation Time: 60 minutes. The patient's level of consciousness and vital signs were monitored continuously by radiology nursing throughout the procedure under my direct supervision. CONTRAST:  45 cc Omnipaque 300 FLUOROSCOPY TIME:  19 minutes (099 mGy) COMPLICATIONS: SIR Level A - No therapy, no consequence. Procedure complicated by development of a small hematoma at the right groin access site. PROCEDURE: Informed consent was obtained from the patient following explanation of the procedure, risks, benefits and alternatives. All questions were addressed. A time out was performed prior to the initiation of the procedure. Maximal barrier sterile technique utilized including caps, mask, sterile gowns, sterile gloves, large sterile drape, hand hygiene, and Betadine prep. The right femoral head was marked fluoroscopically. Under sterile conditions and local anesthesia, the right common femoral artery access was performed with a micropuncture needle. Under direct ultrasound guidance, the right common  femoral was accessed with a micropuncture kit. An ultrasound image was saved for documentation purposes. This allowed for placement of a 5-French vascular sheath. A limited arteriogram was performed through the side arm of the sheath confirming appropriate access within the right common femoral artery. Over a Bentson wire, a Mickelson catheter was advanced the caudal aspect of the thoracic aorta where was reformed, back bled and flushed. The Mickelson catheter was then utilized to select the inferior mesenteric artery and a selective superior mesenteric arteriogram was performed. With the use of a fathom 14 microwire, a regular Renegade microcatheter was advanced to the level of the left colic artery and a selective left colic arteriogram was performed. The microcatheter was then utilized to select the inferior division of the left colic artery and a sub selective arteriogram was performed. The microcatheter was then advanced to select the distal arcade supplying the ill-defined area of extravasation involving the mid descending colon. Sub selective injection confirmed appropriate positioning and the vessel was percutaneously coil embolized with two overlapping 2 mm diameter interlock coils. The microcatheter was retracted to the level the left colic artery and a post embolization left colic arteriogram was performed. The microcatheter was then advanced to select the distal arcade felt to potentially be supplying additional collateral supply to the ill-defined area of active extravasation involving the mid descending colon. Sub selective injection confirmed appropriate positioning and the vessel was percutaneously coil embolized with two overlapping 2 mm diameter interlock coils. The microcatheter was then retracted to the to the level pelvic artery and a selective left colic arteriogram was performed. Microcatheter was removed and a post embolization arteriogram was performed at the level of the IMA Images were  reviewed and the procedure was terminated. All wires, catheters and sheaths were removed from the patient. Hemostasis was achieved at the right groin access site with manual compression. Procedure was complicated by development of a small hematoma at the right groin access site. The patient otherwise tolerated the procedure well without immediate post procedural complication. FINDINGS: Selective inferior mesenteric arteriogram demonstrates conventional branching pattern with ill-defined area of extravasation involving the mid descending colon compatible with findings on preceding CTA. Selective left colic arteriogram redemonstrates the ill-defined area of extravasation within the mid descending colon supplied predominantly via the inferior division of the left colic artery with potential collateral supply from the superior division. Sub selective arteriogram of the inferior division the left colic artery confirms supply to the ill-defined area of extravasation and as such the supplying distal arcade was percutaneously coil embolized. Sub selective  arteriogram of the superior division of the left colic artery suggests additional collateral supply to the ill-defined area of extravasation and as such the supplying distal arcade was percutaneously coil embolized. Completion left colic and inferior mesenteric arteriograms are negative for residual areas of extravasation or collateral supply to the mid descending colon. Note, superior mesenteric arteriogram was not performed given exhaustive evaluation on dedicated mesenteric arteriogram performed 3 days prior (01/09/2021). IMPRESSION: Technically successful percutaneous coil embolization of distal arcades arising from both the superior and inferior divisions of the left colic artery supplying the ill-defined area of intraluminal contrast extravasation involving the mid descending colon compatible with the findings seen on preceding CTA. PLAN: - The patient is to remain  flat for 4 hours with right leg straight (until at least 0730). - The patient will continue to experience several additional bloody bowel movements and may continue to require additional resuscitation (as the patient was bleeding both before and during the procedure), however ultimately I am hopeful she will stabilize in the coming days. Electronically Signed   By: Sandi Mariscal M.D.   On: 01/12/2021 07:24   CT Angio Abd/Pel W and/or Wo Contrast  Result Date: 01/11/2021 CLINICAL DATA:  Gastrointestinal bleed.  bright red blood. EXAM: CTA ABDOMEN AND PELVIS WITHOUT AND WITH CONTRAST TECHNIQUE: Multidetector CT imaging of the abdomen and pelvis was performed using the standard protocol during bolus administration of intravenous contrast. Multiplanar reconstructed images and MIPs were obtained and reviewed to evaluate the vascular anatomy. CONTRAST:  110m OMNIPAQUE IOHEXOL 350 MG/ML SOLN COMPARISON:  Nuclear medicine gastrointestinal bleeding scan 01/08/2021, CT abdomen pelvis 01/08/2021 FINDINGS: VASCULAR Aorta: Severe atherosclerotic plaque. Normal caliber aorta without aneurysm, dissection, vasculitis or significant stenosis. Celiac: Patent without evidence of aneurysm, dissection, vasculitis or significant stenosis. SMA: Patent without evidence of aneurysm, dissection, vasculitis or significant stenosis. Renals: Mild atherosclerotic plaque of the origins. Both renal arteries are patent without evidence of aneurysm, dissection, vasculitis, fibromuscular dysplasia or significant stenosis. IMA: Patent without evidence of aneurysm, dissection, vasculitis or significant stenosis. Inflow: Severe atherosclerotic plaque. Patent without evidence of aneurysm, dissection, vasculitis or significant stenosis. Proximal Outflow: At least moderate atherosclerotic plaque. Bilateral common femoral and visualized portions of the superficial and profunda femoral arteries are patent without evidence of aneurysm, dissection,  vasculitis or significant stenosis. Veins: The hepatic, portal, splenic, superior mesenteric veins are patent. NON-VASCULAR Lower chest: Interval development of a 1.6 x 1.3 cm right lower lobe pulmonary nodular like consolidation likely represents atelectasis (not visualized on CT 2 days ago. Subsegmental atelectasis within bilateral lower lobes. Trace left pleural effusion. Hepatobiliary: No focal liver abnormality. No gallstones, gallbladder wall thickening, or pericholecystic fluid. No biliary dilatation. Pancreas: No focal lesion. Normal pancreatic contour. No surrounding inflammatory changes. No main pancreatic ductal dilatation. Spleen: Normal in size without focal abnormality. Adrenals/Urinary Tract: No adrenal nodule bilaterally. Bilateral kidneys enhance symmetrically. Subcentimeter hypodensities within the kidneys are too small to characterize. No hydronephrosis. No hydroureter. The urinary bladder is unremarkable. Stomach/Bowel: Left distal descending colon/sigmoid colon reanastomosis. Layering hyperdensity within the mid to distal descending colon is noted to slightly increase in density on the portal venous phase but is also not visualized on noncontrast study. Associated slightly hyperdense material within the colonic lumen distally. The colon is noted to be fluid-filled past the splenic flexure. No bowel wall thickening or dilatation. The appendix not definitely identified. Lymphatic: No lymphadenopathy. Reproductive: Status post hysterectomy. No adnexal masses. Other: No intraperitoneal free fluid. No intraperitoneal free gas. No organized fluid  collection. Musculoskeletal: No abdominal wall hernia or abnormality. No suspicious lytic or blastic osseous lesions. No acute displaced fracture. Multilevel degenerative changes of the spine. 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 IMPRESSION: VASCULAR Aortic Atherosclerosis (ICD10-I70.0). NON-VASCULAR  Mid to distal descending colon gastrointestinal hemorrhage that is located proximal to the colonic anastomosis. Electronically Signed   By: Iven Finn M.D.   On: 01/11/2021 22:36    Lab Data:  CBC: Recent Labs  Lab 01/17/21 0457 01/18/21 0526 01/19/21 0403 01/20/21 0706 01/21/21 0553  WBC 15.0* 16.9* 13.3* 13.3* 15.9*  HGB 9.7* 9.1* 8.7* 9.8* 9.7*  HCT 28.9* 26.9* 25.9* 30.5* 30.6*  MCV 87.8 87.9 87.5 90.8 90.8  PLT 214 290 369 504* 431*   Basic Metabolic Panel: Recent Labs  Lab 01/17/21 0457 01/18/21 0526 01/19/21 0403 01/19/21 1346 01/20/21 0706 01/21/21 0553  NA 132* 133* 131*  --  133* 135  K 3.4* 4.6 2.9* 4.0 4.6 4.2  CL 104 102 94*  --  95* 94*  CO2 22 25 31   --  32 30  GLUCOSE 163* 102* 145*  --  109* 91  BUN 12 13 7*  --  10 16  CREATININE 0.58 0.54 0.61  --  0.66 0.80  CALCIUM 6.7* 6.9* 6.9*  --  7.7* 7.8*  MG 1.6* 1.9 1.5*  --  2.2  --    GFR: Estimated Creatinine Clearance: 39.8 mL/min (by C-G formula based on SCr of 0.8 mg/dL). Liver Function Tests: Recent Labs  Lab 01/16/21 0539 01/17/21 0457 01/21/21 0553  AST 19 24 15   ALT 13 14 15   ALKPHOS 39 33* 46  BILITOT 0.7 1.7* 1.0  PROT 4.6* 3.8* 4.5*  ALBUMIN 2.0* 1.7* 1.9*   No results for input(s): LIPASE, AMYLASE in the last 168 hours. No results for input(s): AMMONIA in the last 168 hours. Coagulation Profile: Recent Labs  Lab 01/16/21 2259  INR 1.4*   Cardiac Enzymes: No results for input(s): CKTOTAL, CKMB, CKMBINDEX, TROPONINI in the last 168 hours. BNP (last 3 results) No results for input(s): PROBNP in the last 8760 hours. HbA1C: No results for input(s): HGBA1C in the last 72 hours. CBG: Recent Labs  Lab 01/20/21 1138 01/20/21 1624 01/20/21 2121 01/21/21 0743 01/21/21 1151  GLUCAP 133* 112* 106* 77 195*   Lipid Profile: No results for input(s): CHOL, HDL, LDLCALC, TRIG, CHOLHDL, LDLDIRECT in the last 72 hours. Thyroid Function Tests: No results for input(s): TSH,  T4TOTAL, FREET4, T3FREE, THYROIDAB in the last 72 hours. Anemia Panel: No results for input(s): VITAMINB12, FOLATE, FERRITIN, TIBC, IRON, RETICCTPCT in the last 72 hours. Urine analysis:    Component Value Date/Time   COLORURINE YELLOW 03/09/2013 2028   APPEARANCEUR CLEAR 03/09/2013 2028   LABSPEC 1.036 (H) 03/09/2013 2028   PHURINE 6.5 03/09/2013 2028   GLUCOSEU 500 (A) 03/09/2013 2028   HGBUR NEGATIVE 03/09/2013 2028   BILIRUBINUR NEGATIVE 03/09/2013 2028   KETONESUR 15 (A) 03/09/2013 2028   PROTEINUR NEGATIVE 03/09/2013 2028   UROBILINOGEN 1.0 03/09/2013 2028   NITRITE NEGATIVE 03/09/2013 2028   LEUKOCYTESUR NEGATIVE 03/09/2013 2028     Avilyn Virtue M.D. [facility] 01/21/2021, 3:54 PM  Available via Epic secure chat 7am-7pm After 7 pm, please refer to night coverage provider listed on amion.

## 2021-01-21 NOTE — Consult Note (Signed)
[facility] Nurse ostomy follow up Stoma type/location: RMQ ileostomy  Well budded Stomal assessment/size: 1 3/8" pink patent and producing liquid green stool Peristomal assessment: intact Treatment options for stomal/peristomal skin: barrier ring and convex 1 piece pouch Output liquid green stool Ostomy pouching: 1pc.convex Education provided: Pouch change performed by patient.  She removed old pouch and cleansed.  Measured and cut new barrier and applied barrier ring and new pouch with minimal assist.  Daughter at bedside would like to perform next pouch change.  Enrolled patient in [place] Discharge program: Yes today.  Will follow.  Domenic Moras MSN, RN, FNP-BC [facility] Wound, Ostomy, Continence Nurse Pager [phone]

## 2021-01-22 LAB — GLUCOSE, CAPILLARY
Glucose-Capillary: 96 mg/dL (ref 70–99)
Glucose-Capillary: 139 mg/dL — ABNORMAL HIGH (ref 70–99)
Glucose-Capillary: 155 mg/dL — ABNORMAL HIGH (ref 70–99)
Glucose-Capillary: 145 mg/dL — ABNORMAL HIGH (ref 70–99)

## 2021-01-22 LAB — BASIC METABOLIC PANEL
Anion gap: 6 (ref 5–15)
BUN: 26 mg/dL — ABNORMAL HIGH (ref 8–23)
CO2: 36 mmol/L — ABNORMAL HIGH (ref 22–32)
Calcium: 7.8 mg/dL — ABNORMAL LOW (ref 8.9–10.3)
Chloride: 94 mmol/L — ABNORMAL LOW (ref 98–111)
Creatinine, Ser: 0.82 mg/dL (ref 0.44–1.00)
GFR, Estimated: >60 mL/min (ref >60)
Glucose, Bld: 107 mg/dL — ABNORMAL HIGH (ref 70–99)
Potassium: 3.8 mmol/L (ref 3.5–5.1)
Sodium: 136 mmol/L (ref 135–145)

## 2021-01-22 LAB — CBC
HCT: 27.6 % — ABNORMAL LOW (ref 36.0–46.0)
Hemoglobin: 8.9 g/dL — ABNORMAL LOW (ref 12.0–15.0)
MCH: 29.2 pg (ref 26.0–34.0)
MCHC: 32.2 g/dL (ref 30.0–36.0)
MCV: 90.5 fL (ref 80.0–100.0)
Platelets: 639 10*3/uL — ABNORMAL HIGH (ref 150–400)
RBC: 3.05 MIL/uL — ABNORMAL LOW (ref 3.87–5.11)
RDW: 15.9 % — ABNORMAL HIGH (ref 11.5–15.5)
WBC: 14.9 10*3/uL — ABNORMAL HIGH (ref 4.0–10.5)
nRBC: 0.1 % (ref 0.0–0.2)

## 2021-01-22 NOTE — Progress Notes (Signed)
[facility] consult progress note                                                                               Patient Demographics  Kathleen Pope, is a 87 y.o. female, DOB - 01-29-1933, MBE:[id number]  Admit date - 01/11/2021   Admitting Physician Elwyn Reach, MD  Outpatient Primary MD for the patient is Redmond School, MD  Outpatient specialists:   LOS - 11  days   Medical records reviewed and are as summarized below:    Chief Complaint  Patient presents with  . GI Bleeding       Brief summary   Kathleen Pope is a 87 y.o. 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 Patient presented secondary recurrent GI bleeding. Patient underwent coil embolization which unfortunately appears to have left to ischemic colon requiring partial colectomy. After partial colectomy, patient developed hypotension and brisk GI bleeding requiring total colectomy.    Assessment & Plan   Primary problem Lower GI bleeding with likely diverticular bleed, acute blood loss anemia -Presented with, recently admitted with similar episode. -Status post mesenteric arteriogram with successful coil embolization of left colic artery on 0/07 -Recurrent large-volume episode of hematochezia on 3/1 overnight.  GI was consulted again and performed a flexible sigmoidoscopy on 3/2 which showed colonic ischemia -After partial colectomy, had recurrent GI bleeding.  Colonoscopy at bedside unsuccessful in identifying the lesion secondary to previous blood and was recommended colectomy, performed on 3/5 -Received 7 units packed RBCs for GI bleeding in addition to palliative transfusions -H&H stable, 8.9   Active problems Colonic ischemia -Seen on endoscopy.  General surgery consulted -Status post ex lap with partial colectomy and end colostomy on 3/3. -NG tube removed, underwent total colectomy with end ileostomy on  3/5 -Poor p.o. intake but tolerating diet   Hypovolemic shock, hemorrhagic shock, hypotension -Secondary to blood loss from GI bleeding, requiring aggressive fluid, blood resuscitation in addition to short-term vasopressor support -SBP running in 80s, discontinued IV Lasix today, BP now improving  Acute respiratory failure with hypoxia secondary to fluid overload -Likely due to overload from hyper obesity transfusions, IV fluid.  Net + 10 L with maximum 15 kg weight gain -Patient was placed on a Lasix 40 mg IV twice daily, has severe aortic stenosis, cautious diuresis.   -Not on O2 at the time of my encounter, I's and O's ~6.8 L positive but improving. -Hold off on Lasix IV.  Will reassess in a.m. if she needs any further diuresis.   Severe aortic stenosis Seen on Transthoracic Echocardiogram. Will need outpatient cardiology follow-up, follows Dr. Johney Frame, last seen on 12/17/2020.  Hyperglycemia -Hemoglobin A1c 7.4, continue SSI  -  will benefit from Metformin on discharge  COPD -Currently no wheezing,  -Has history of nicotine use    Essential hypertension -BP currently soft, continue to hold losartan. -On IV Lasix 40 mg twice daily, follow closely  Collagenous colitis -Takes budesonide as an outpatient, currently on prescribed dose of 6 mg daily  Leukocytosis -Could be due to budesonide, currently no fevers, improving  Hyperlipidemia -Continue statin  Thrombocytosis -Likely reactive, improving     Protein-calorie malnutrition, severe -Albumin 1.9, recurrent GI bleeding chronic illness.  BMI 21 -Continue nutritional supplements    Code Status: DNR DVT Prophylaxis:  enoxaparin (LOVENOX) injection 30 mg Start: 01/20/21 1100 Place and maintain sequential compression device Start: 01/15/21 1610 SCDs Start: 01/12/21 0237   Level of Care: Level of care: Stepdown Family Communication: Discussed all imaging results, lab results, explained to the patient     Disposition Plan:     Status is: Inpatient per surgery  Remains inpatient appropriate because:Inpatient level of care appropriate due to severity of illness   Time Spent in minutes 35 minutes  Procedures:   POST MESENTERIC ARTERIOGRAM AND PERCUTANEOUS EMBOLIZATION (01/12/2021)  EGD/FLEXIBLE SIGMOIDOSCOPY (01/15/2021)  PARTIAL COLECTOMY (01/15/2021)   Antimicrobials:   Anti-infectives (From admission, onward)   Start     Dose/Rate Route Frequency Ordered Stop   01/15/21 1800  ciprofloxacin (CIPRO) IVPB 400 mg  Status:  Discontinued        400 mg 200 mL/hr over 60 Minutes Intravenous Every 12 hours 01/15/21 1609 01/19/21 0817   01/15/21 1700  metroNIDAZOLE (FLAGYL) IVPB 500 mg  Status:  Discontinued        500 mg 100 mL/hr over 60 Minutes Intravenous Every 8 hours 01/15/21 1609 01/19/21 0817   01/15/21 0800  cefoTEtan (CEFOTAN) 2 g in sodium chloride 0.9 % 100 mL IVPB        2 g 200 mL/hr over 30 Minutes Intravenous On call to O.R. 01/15/21 0705 01/15/21 1145         Medications  Scheduled Meds: . sodium chloride   Intravenous Once  . sodium chloride   Intravenous Once  . acetaminophen  1,000 mg Oral Q6H  . budesonide  6 mg Oral Daily  . calcium carbonate  1,250 mg Oral Daily  . Chlorhexidine Gluconate Cloth  6 each Topical Daily  . enoxaparin (LOVENOX) injection  30 mg Subcutaneous Daily  . feeding supplement  237 mL Oral BID BM  . ferrous sulfate  325 mg Oral BID WC  . insulin aspart  0-15 Units Subcutaneous TID WC  . metoprolol tartrate  25 mg Oral BID  . multivitamin with minerals  1 tablet Oral Daily  . pantoprazole (PROTONIX) IV  40 mg Intravenous Q24H  . pravastatin  10 mg Oral Daily  . psyllium  1 packet Oral BID  . sodium chloride flush  10-40 mL Intracatheter Q12H   Continuous Infusions: . sodium chloride Stopped (01/16/21 0641)  . methocarbamol (ROBAXIN) IV Stopped (01/19/21 0027)  . phenylephrine (NEO-SYNEPHRINE) Adult infusion Stopped (01/16/21  2151)   PRN Meds:.sodium chloride, albuterol, lip balm, methocarbamol (ROBAXIN) IV, morphine injection, ondansetron **OR** ondansetron (ZOFRAN) IV, oxyCODONE, sodium chloride flush      Subjective:   Kathleen Pope was seen and examined today.  Very frail and deconditioned.  BP today soft in 80s.  Not on any O2 at the time of my encounter.  No acute chest pain or shortness of breath.  Objective:   Vitals:   01/22/21 1145 01/22/21 1200 01/22/21 1300 01/22/21 1400  BP:  (!) 93/50 (!) 92/45 (!) 108/55  Pulse:  67 66 70  Resp:  12 11 12   Temp: 98.1 F (36.7 C)     TempSrc: Oral     SpO2:  92% 93% 94%  Weight:      Height:        Intake/Output Summary (Last 24 hours) at 01/22/2021  1529 Last data filed at 01/22/2021 1200 Gross per 24 hour  Intake 200 ml  Output 850 ml  Net -650 ml     Wt Readings from Last 3 Encounters:  01/22/21 53.8 kg  01/08/21 40.4 kg  12/29/20 40.4 kg    Physical Exam  General: Alert and oriented x 3, NAD  Cardiovascular: S1 S2 clear, 3/6 systolic murmur radiating to carotids.    Respiratory: Diminished breath sound at the bases  Gastrointestinal: Soft, dressing intact, ileostomy  Ext: no pedal edema bilaterally  Neuro: no new deficits  Musculoskeletal: No cyanosis, clubbing  Skin: No rashes  Psych: Normal affect and demeanor, alert and oriented x3    Data Reviewed:  I have personally reviewed following labs and imaging studies  Micro Results No results found for this or any previous visit (from the past 240 hour(s)).  Radiology Reports NM GI Blood Loss  Addendum Date: 01/08/2021   ADDENDUM REPORT: 01/08/2021 22:08 ADDENDUM: These results were discussed by telephone at the time of interpretation on 01/08/2021 at 10:07 pm to Dr. Havery Moros, who verbally acknowledged these results. Electronically Signed   By: Anner Crete M.D.   On: 01/08/2021 22:08   Result Date: 01/08/2021 CLINICAL DATA:  87 year old female with lower GI bleed.  EXAM: NUCLEAR MEDICINE GASTROINTESTINAL BLEEDING SCAN TECHNIQUE: Sequential abdominal images were obtained following intravenous administration of Tc-67mlabeled red blood cells. RADIOPHARMACEUTICALS:  19.8 mCi Tc-910mertechnetate in-vitro labeled red cells. COMPARISON:  CT abdomen pelvis dated 01/08/2021. FINDINGS: There is physiologic uptake within the liver, kidneys, and cardiac blood pool. Physiologic activity noted in the major vasculature. There is radiotracer activity in the left lower quadrant conforming to the distal descending colon and sigmoid consistent with active GI bleed. Please note active diverticular bleed in the region of the mid descending colon is present on the earlier CT. IMPRESSION: Active GI bleed in the mid to distal descending colon corresponding to the diverticular bleed on the earlier CT. Electronically Signed: By: ArAnner Crete.D. On: 01/08/2021 21:11   CT ABDOMEN PELVIS W CONTRAST  Result Date: 01/08/2021 CLINICAL DATA:  Melena. EXAM: CT ABDOMEN AND PELVIS WITH CONTRAST TECHNIQUE: Multidetector CT imaging of the abdomen and pelvis was performed using the standard protocol following bolus administration of intravenous contrast. CONTRAST:  7559mMNIPAQUE IOHEXOL 300 MG/ML  SOLN COMPARISON:  None. FINDINGS: Lower chest: Minimal left pleural effusion is noted. Bibasilar subsegmental atelectasis is noted. Hepatobiliary: No focal liver abnormality is seen. No gallstones, gallbladder wall thickening, or biliary dilatation. Pancreas: Unremarkable. No pancreatic ductal dilatation or surrounding inflammatory changes. Spleen: Normal in size without focal abnormality. Adrenals/Urinary Tract: Adrenal glands are unremarkable. Kidneys are normal, without renal calculi, focal lesion, or hydronephrosis. Bladder is unremarkable. Stomach/Bowel: The stomach and appendix appear normal. No small bowel dilatation is noted. Postsurgical changes are seen involving the descending colon. The colon is  dilated and fluid-filled. Moderate amount of stool is seen throughout the colon. No definite wall thickening is noted. Vascular/Lymphatic: Aortic atherosclerosis. No enlarged abdominal or pelvic lymph nodes. Reproductive: Uterus and bilateral adnexa are unremarkable. Other: No abdominal wall hernia or abnormality. No abdominopelvic ascites. Musculoskeletal: Grade 1 anterolisthesis of L5-S1 is noted secondary to bilateral L5 spondylolysis. No acute osseous abnormality is noted. IMPRESSION: 1. Minimal left pleural effusion is noted with bibasilar subsegmental atelectasis. 2. Postsurgical changes are seen involving the descending colon. The colon is dilated and fluid-filled. Moderate amount of stool is seen throughout the colon. No definite wall thickening is noted. 3. Grade  1 anterolisthesis of L5-S1 secondary to bilateral L5 spondylolysis. Aortic Atherosclerosis (ICD10-I70.0). Electronically Signed   By: Marijo Conception M.D.   On: 01/08/2021 14:20   IR Angiogram Visceral Selective  Result Date: 01/12/2021 INDICATION: Recurrent lower GI bleeding. Positive CTA. Please perform mesenteric arteriogram with potential embolization. EXAM: 1. ULTRASOUND GUIDANCE FOR ARTERIAL ACCESS 2. SELECTIVE INFERIOR MESENTERIC ARTERIOGRAM 3. SUB SELECTIVE LEFT COLIC ARTERIOGRAM 4. SUB SELECTIVE DISTAL ARCADE DIVISIONS OF THE LEFT COLIC ARTERY AND PERCUTANEOUS COIL EMBOLIZATION COMPARISON:  CTA abdomen and pelvis - 01/11/2021 Mesenteric arteriogram - 01/09/2021 Nuclear medicine tagged red blood cell bleeding scan - 01/08/2021 CT abdomen and pelvis - 01/08/2021 MEDICATIONS: None ANESTHESIA/SEDATION: Moderate (conscious) sedation was employed during this procedure. A total of Versed 0.5 mg was administered intravenously. Moderate Sedation Time: 60 minutes. The patient's level of consciousness and vital signs were monitored continuously by radiology nursing throughout the procedure under my direct supervision. CONTRAST:  45 cc Omnipaque  300 FLUOROSCOPY TIME:  19 minutes (415 mGy) COMPLICATIONS: SIR Level A - No therapy, no consequence. Procedure complicated by development of a small hematoma at the right groin access site. PROCEDURE: Informed consent was obtained from the patient following explanation of the procedure, risks, benefits and alternatives. All questions were addressed. A time out was performed prior to the initiation of the procedure. Maximal barrier sterile technique utilized including caps, mask, sterile gowns, sterile gloves, large sterile drape, hand hygiene, and Betadine prep. The right femoral head was marked fluoroscopically. Under sterile conditions and local anesthesia, the right common femoral artery access was performed with a micropuncture needle. Under direct ultrasound guidance, the right common femoral was accessed with a micropuncture kit. An ultrasound image was saved for documentation purposes. This allowed for placement of a 5-French vascular sheath. A limited arteriogram was performed through the side arm of the sheath confirming appropriate access within the right common femoral artery. Over a Bentson wire, a Mickelson catheter was advanced the caudal aspect of the thoracic aorta where was reformed, back bled and flushed. The Mickelson catheter was then utilized to select the inferior mesenteric artery and a selective superior mesenteric arteriogram was performed. With the use of a fathom 14 microwire, a regular Renegade microcatheter was advanced to the level of the left colic artery and a selective left colic arteriogram was performed. The microcatheter was then utilized to select the inferior division of the left colic artery and a sub selective arteriogram was performed. The microcatheter was then advanced to select the distal arcade supplying the ill-defined area of extravasation involving the mid descending colon. Sub selective injection confirmed appropriate positioning and the vessel was percutaneously coil  embolized with two overlapping 2 mm diameter interlock coils. The microcatheter was retracted to the level the left colic artery and a post embolization left colic arteriogram was performed. The microcatheter was then advanced to select the distal arcade felt to potentially be supplying additional collateral supply to the ill-defined area of active extravasation involving the mid descending colon. Sub selective injection confirmed appropriate positioning and the vessel was percutaneously coil embolized with two overlapping 2 mm diameter interlock coils. The microcatheter was then retracted to the to the level pelvic artery and a selective left colic arteriogram was performed. Microcatheter was removed and a post embolization arteriogram was performed at the level of the IMA Images were reviewed and the procedure was terminated. All wires, catheters and sheaths were removed from the patient. Hemostasis was achieved at the right groin access site with manual compression.  Procedure was complicated by development of a small hematoma at the right groin access site. The patient otherwise tolerated the procedure well without immediate post procedural complication. FINDINGS: Selective inferior mesenteric arteriogram demonstrates conventional branching pattern with ill-defined area of extravasation involving the mid descending colon compatible with findings on preceding CTA. Selective left colic arteriogram redemonstrates the ill-defined area of extravasation within the mid descending colon supplied predominantly via the inferior division of the left colic artery with potential collateral supply from the superior division. Sub selective arteriogram of the inferior division the left colic artery confirms supply to the ill-defined area of extravasation and as such the supplying distal arcade was percutaneously coil embolized. Sub selective arteriogram of the superior division of the left colic artery suggests additional  collateral supply to the ill-defined area of extravasation and as such the supplying distal arcade was percutaneously coil embolized. Completion left colic and inferior mesenteric arteriograms are negative for residual areas of extravasation or collateral supply to the mid descending colon. Note, superior mesenteric arteriogram was not performed given exhaustive evaluation on dedicated mesenteric arteriogram performed 3 days prior (01/09/2021). IMPRESSION: Technically successful percutaneous coil embolization of distal arcades arising from both the superior and inferior divisions of the left colic artery supplying the ill-defined area of intraluminal contrast extravasation involving the mid descending colon compatible with the findings seen on preceding CTA. PLAN: - The patient is to remain flat for 4 hours with right leg straight (until at least 0730). - The patient will continue to experience several additional bloody bowel movements and may continue to require additional resuscitation (as the patient was bleeding both before and during the procedure), however ultimately I am hopeful she will stabilize in the coming days. Electronically Signed   By: Sandi Mariscal M.D.   On: 01/12/2021 07:24   IR Angiogram Visceral Selective  Result Date: 01/09/2021 CLINICAL DATA:  GI bleed, localized to proximal descending colon on CTA and scintigraphy EXAM: SELECTIVE VISCERAL ARTERIOGRAPHY; IR ULTRASOUND GUIDANCE VASC ACCESS RIGHT ANESTHESIA/SEDATION: Intravenous Fentanyl 32mg and Versed or 1.530mwere administered as conscious sedation during continuous monitoring of the patient's level of consciousness and physiological / cardiorespiratory status by the radiology RN, with a total moderate sedation time of 41 minutes. MEDICATIONS: Lidocaine 1% subcutaneous CONTRAST:  302mMNIPAQUE IOHEXOL 300 MG/ML SOLN, 53m23mNIPAQUE IOHEXOL 300 MG/ML SOLN, 32mL64mIPAQUE IOHEXOL 300 MG/ML SOLN PROCEDURE: The procedure, risks (including but  not limited to bleeding, infection, organ damage ), benefits, and alternatives were explained to the patient. Questions regarding the procedure were encouraged and answered. The patient understands and consents to the procedure. Right femoral region prepped and draped in usual sterile fashion. Maximal barrier sterile technique was utilized including caps, mask, sterile gowns, sterile gloves, sterile drape, hand hygiene and skin antiseptic. The right common femoral artery was localized under ultrasound. Under real-time ultrasound guidance, the vessel was accessed with a 21-gauge micropuncture needle, exchanged over a 018 guidewire for a transitional dilator, through which a 035 guidewire was advanced. Over this, a 5 Frenc[place]ular sheath was placed, through which a 5 Fren[place]atheter was advanced and used to selectively catheterize the superior mesenteric artery for selective arteriography in multiple projections. A coaxial Renegade microcatheter with a fathom guidewire was advanced and used for selective arteriography of 2 third order branches in multiple projections. The catheter and sheath were removed and hemostasis achieved with the aid of the Exoseal device after confirmatory femoral arteriography. The patient tolerated the procedure well. COMPLICATIONS: None immediate FINDINGS:  No evidence of active extravasation, early draining vein, AVM, or other lesion to suggest a site or etiology of the patient's GI bleeding. Venous phase confirms patency of the portal venous system. IMPRESSION: 1. Negative superior mesenteric arteriogram. No evidence of active extravasation or other focal lesion to suggest etiology of GI bleed. Electronically Signed   By: Lucrezia Europe M.D.   On: 01/09/2021 09:46   IR Angiogram Selective Each Additional Vessel  Result Date: 01/12/2021 INDICATION: Recurrent lower GI bleeding. Positive CTA. Please perform mesenteric arteriogram with potential embolization. EXAM: 1. ULTRASOUND GUIDANCE  FOR ARTERIAL ACCESS 2. SELECTIVE INFERIOR MESENTERIC ARTERIOGRAM 3. SUB SELECTIVE LEFT COLIC ARTERIOGRAM 4. SUB SELECTIVE DISTAL ARCADE DIVISIONS OF THE LEFT COLIC ARTERY AND PERCUTANEOUS COIL EMBOLIZATION COMPARISON:  CTA abdomen and pelvis - 01/11/2021 Mesenteric arteriogram - 01/09/2021 Nuclear medicine tagged red blood cell bleeding scan - 01/08/2021 CT abdomen and pelvis - 01/08/2021 MEDICATIONS: None ANESTHESIA/SEDATION: Moderate (conscious) sedation was employed during this procedure. A total of Versed 0.5 mg was administered intravenously. Moderate Sedation Time: 60 minutes. The patient's level of consciousness and vital signs were monitored continuously by radiology nursing throughout the procedure under my direct supervision. CONTRAST:  45 cc Omnipaque 300 FLUOROSCOPY TIME:  19 minutes (030 mGy) COMPLICATIONS: SIR Level A - No therapy, no consequence. Procedure complicated by development of a small hematoma at the right groin access site. PROCEDURE: Informed consent was obtained from the patient following explanation of the procedure, risks, benefits and alternatives. All questions were addressed. A time out was performed prior to the initiation of the procedure. Maximal barrier sterile technique utilized including caps, mask, sterile gowns, sterile gloves, large sterile drape, hand hygiene, and Betadine prep. The right femoral head was marked fluoroscopically. Under sterile conditions and local anesthesia, the right common femoral artery access was performed with a micropuncture needle. Under direct ultrasound guidance, the right common femoral was accessed with a micropuncture kit. An ultrasound image was saved for documentation purposes. This allowed for placement of a 5-French vascular sheath. A limited arteriogram was performed through the side arm of the sheath confirming appropriate access within the right common femoral artery. Over a Bentson wire, a Mickelson catheter was advanced the caudal  aspect of the thoracic aorta where was reformed, back bled and flushed. The Mickelson catheter was then utilized to select the inferior mesenteric artery and a selective superior mesenteric arteriogram was performed. With the use of a fathom 14 microwire, a regular Renegade microcatheter was advanced to the level of the left colic artery and a selective left colic arteriogram was performed. The microcatheter was then utilized to select the inferior division of the left colic artery and a sub selective arteriogram was performed. The microcatheter was then advanced to select the distal arcade supplying the ill-defined area of extravasation involving the mid descending colon. Sub selective injection confirmed appropriate positioning and the vessel was percutaneously coil embolized with two overlapping 2 mm diameter interlock coils. The microcatheter was retracted to the level the left colic artery and a post embolization left colic arteriogram was performed. The microcatheter was then advanced to select the distal arcade felt to potentially be supplying additional collateral supply to the ill-defined area of active extravasation involving the mid descending colon. Sub selective injection confirmed appropriate positioning and the vessel was percutaneously coil embolized with two overlapping 2 mm diameter interlock coils. The microcatheter was then retracted to the to the level pelvic artery and a selective left colic arteriogram was performed. Microcatheter was removed and  a post embolization arteriogram was performed at the level of the IMA Images were reviewed and the procedure was terminated. All wires, catheters and sheaths were removed from the patient. Hemostasis was achieved at the right groin access site with manual compression. Procedure was complicated by development of a small hematoma at the right groin access site. The patient otherwise tolerated the procedure well without immediate post procedural  complication. FINDINGS: Selective inferior mesenteric arteriogram demonstrates conventional branching pattern with ill-defined area of extravasation involving the mid descending colon compatible with findings on preceding CTA. Selective left colic arteriogram redemonstrates the ill-defined area of extravasation within the mid descending colon supplied predominantly via the inferior division of the left colic artery with potential collateral supply from the superior division. Sub selective arteriogram of the inferior division the left colic artery confirms supply to the ill-defined area of extravasation and as such the supplying distal arcade was percutaneously coil embolized. Sub selective arteriogram of the superior division of the left colic artery suggests additional collateral supply to the ill-defined area of extravasation and as such the supplying distal arcade was percutaneously coil embolized. Completion left colic and inferior mesenteric arteriograms are negative for residual areas of extravasation or collateral supply to the mid descending colon. Note, superior mesenteric arteriogram was not performed given exhaustive evaluation on dedicated mesenteric arteriogram performed 3 days prior (01/09/2021). IMPRESSION: Technically successful percutaneous coil embolization of distal arcades arising from both the superior and inferior divisions of the left colic artery supplying the ill-defined area of intraluminal contrast extravasation involving the mid descending colon compatible with the findings seen on preceding CTA. PLAN: - The patient is to remain flat for 4 hours with right leg straight (until at least 0730). - The patient will continue to experience several additional bloody bowel movements and may continue to require additional resuscitation (as the patient was bleeding both before and during the procedure), however ultimately I am hopeful she will stabilize in the coming days. Electronically Signed    By: Sandi Mariscal M.D.   On: 01/12/2021 07:24   IR Angiogram Selective Each Additional Vessel  Result Date: 01/12/2021 INDICATION: Recurrent lower GI bleeding. Positive CTA. Please perform mesenteric arteriogram with potential embolization. EXAM: 1. ULTRASOUND GUIDANCE FOR ARTERIAL ACCESS 2. SELECTIVE INFERIOR MESENTERIC ARTERIOGRAM 3. SUB SELECTIVE LEFT COLIC ARTERIOGRAM 4. SUB SELECTIVE DISTAL ARCADE DIVISIONS OF THE LEFT COLIC ARTERY AND PERCUTANEOUS COIL EMBOLIZATION COMPARISON:  CTA abdomen and pelvis - 01/11/2021 Mesenteric arteriogram - 01/09/2021 Nuclear medicine tagged red blood cell bleeding scan - 01/08/2021 CT abdomen and pelvis - 01/08/2021 MEDICATIONS: None ANESTHESIA/SEDATION: Moderate (conscious) sedation was employed during this procedure. A total of Versed 0.5 mg was administered intravenously. Moderate Sedation Time: 60 minutes. The patient's level of consciousness and vital signs were monitored continuously by radiology nursing throughout the procedure under my direct supervision. CONTRAST:  45 cc Omnipaque 300 FLUOROSCOPY TIME:  19 minutes (916 mGy) COMPLICATIONS: SIR Level A - No therapy, no consequence. Procedure complicated by development of a small hematoma at the right groin access site. PROCEDURE: Informed consent was obtained from the patient following explanation of the procedure, risks, benefits and alternatives. All questions were addressed. A time out was performed prior to the initiation of the procedure. Maximal barrier sterile technique utilized including caps, mask, sterile gowns, sterile gloves, large sterile drape, hand hygiene, and Betadine prep. The right femoral head was marked fluoroscopically. Under sterile conditions and local anesthesia, the right common femoral artery access was performed with a micropuncture needle. Under  direct ultrasound guidance, the right common femoral was accessed with a micropuncture kit. An ultrasound image was saved for documentation  purposes. This allowed for placement of a 5-French vascular sheath. A limited arteriogram was performed through the side arm of the sheath confirming appropriate access within the right common femoral artery. Over a Bentson wire, a Mickelson catheter was advanced the caudal aspect of the thoracic aorta where was reformed, back bled and flushed. The Mickelson catheter was then utilized to select the inferior mesenteric artery and a selective superior mesenteric arteriogram was performed. With the use of a fathom 14 microwire, a regular Renegade microcatheter was advanced to the level of the left colic artery and a selective left colic arteriogram was performed. The microcatheter was then utilized to select the inferior division of the left colic artery and a sub selective arteriogram was performed. The microcatheter was then advanced to select the distal arcade supplying the ill-defined area of extravasation involving the mid descending colon. Sub selective injection confirmed appropriate positioning and the vessel was percutaneously coil embolized with two overlapping 2 mm diameter interlock coils. The microcatheter was retracted to the level the left colic artery and a post embolization left colic arteriogram was performed. The microcatheter was then advanced to select the distal arcade felt to potentially be supplying additional collateral supply to the ill-defined area of active extravasation involving the mid descending colon. Sub selective injection confirmed appropriate positioning and the vessel was percutaneously coil embolized with two overlapping 2 mm diameter interlock coils. The microcatheter was then retracted to the to the level pelvic artery and a selective left colic arteriogram was performed. Microcatheter was removed and a post embolization arteriogram was performed at the level of the IMA Images were reviewed and the procedure was terminated. All wires, catheters and sheaths were removed from the  patient. Hemostasis was achieved at the right groin access site with manual compression. Procedure was complicated by development of a small hematoma at the right groin access site. The patient otherwise tolerated the procedure well without immediate post procedural complication. FINDINGS: Selective inferior mesenteric arteriogram demonstrates conventional branching pattern with ill-defined area of extravasation involving the mid descending colon compatible with findings on preceding CTA. Selective left colic arteriogram redemonstrates the ill-defined area of extravasation within the mid descending colon supplied predominantly via the inferior division of the left colic artery with potential collateral supply from the superior division. Sub selective arteriogram of the inferior division the left colic artery confirms supply to the ill-defined area of extravasation and as such the supplying distal arcade was percutaneously coil embolized. Sub selective arteriogram of the superior division of the left colic artery suggests additional collateral supply to the ill-defined area of extravasation and as such the supplying distal arcade was percutaneously coil embolized. Completion left colic and inferior mesenteric arteriograms are negative for residual areas of extravasation or collateral supply to the mid descending colon. Note, superior mesenteric arteriogram was not performed given exhaustive evaluation on dedicated mesenteric arteriogram performed 3 days prior (01/09/2021). IMPRESSION: Technically successful percutaneous coil embolization of distal arcades arising from both the superior and inferior divisions of the left colic artery supplying the ill-defined area of intraluminal contrast extravasation involving the mid descending colon compatible with the findings seen on preceding CTA. PLAN: - The patient is to remain flat for 4 hours with right leg straight (until at least 0730). - The patient will continue to  experience several additional bloody bowel movements and may continue to require additional resuscitation (as  the patient was bleeding both before and during the procedure), however ultimately I am hopeful she will stabilize in the coming days. Electronically Signed   By: Sandi Mariscal M.D.   On: 01/12/2021 07:24   IR Angiogram Selective Each Additional Vessel  Result Date: 01/12/2021 INDICATION: Recurrent lower GI bleeding. Positive CTA. Please perform mesenteric arteriogram with potential embolization. EXAM: 1. ULTRASOUND GUIDANCE FOR ARTERIAL ACCESS 2. SELECTIVE INFERIOR MESENTERIC ARTERIOGRAM 3. SUB SELECTIVE LEFT COLIC ARTERIOGRAM 4. SUB SELECTIVE DISTAL ARCADE DIVISIONS OF THE LEFT COLIC ARTERY AND PERCUTANEOUS COIL EMBOLIZATION COMPARISON:  CTA abdomen and pelvis - 01/11/2021 Mesenteric arteriogram - 01/09/2021 Nuclear medicine tagged red blood cell bleeding scan - 01/08/2021 CT abdomen and pelvis - 01/08/2021 MEDICATIONS: None ANESTHESIA/SEDATION: Moderate (conscious) sedation was employed during this procedure. A total of Versed 0.5 mg was administered intravenously. Moderate Sedation Time: 60 minutes. The patient's level of consciousness and vital signs were monitored continuously by radiology nursing throughout the procedure under my direct supervision. CONTRAST:  45 cc Omnipaque 300 FLUOROSCOPY TIME:  19 minutes (382 mGy) COMPLICATIONS: SIR Level A - No therapy, no consequence. Procedure complicated by development of a small hematoma at the right groin access site. PROCEDURE: Informed consent was obtained from the patient following explanation of the procedure, risks, benefits and alternatives. All questions were addressed. A time out was performed prior to the initiation of the procedure. Maximal barrier sterile technique utilized including caps, mask, sterile gowns, sterile gloves, large sterile drape, hand hygiene, and Betadine prep. The right femoral head was marked fluoroscopically. Under sterile  conditions and local anesthesia, the right common femoral artery access was performed with a micropuncture needle. Under direct ultrasound guidance, the right common femoral was accessed with a micropuncture kit. An ultrasound image was saved for documentation purposes. This allowed for placement of a 5-French vascular sheath. A limited arteriogram was performed through the side arm of the sheath confirming appropriate access within the right common femoral artery. Over a Bentson wire, a Mickelson catheter was advanced the caudal aspect of the thoracic aorta where was reformed, back bled and flushed. The Mickelson catheter was then utilized to select the inferior mesenteric artery and a selective superior mesenteric arteriogram was performed. With the use of a fathom 14 microwire, a regular Renegade microcatheter was advanced to the level of the left colic artery and a selective left colic arteriogram was performed. The microcatheter was then utilized to select the inferior division of the left colic artery and a sub selective arteriogram was performed. The microcatheter was then advanced to select the distal arcade supplying the ill-defined area of extravasation involving the mid descending colon. Sub selective injection confirmed appropriate positioning and the vessel was percutaneously coil embolized with two overlapping 2 mm diameter interlock coils. The microcatheter was retracted to the level the left colic artery and a post embolization left colic arteriogram was performed. The microcatheter was then advanced to select the distal arcade felt to potentially be supplying additional collateral supply to the ill-defined area of active extravasation involving the mid descending colon. Sub selective injection confirmed appropriate positioning and the vessel was percutaneously coil embolized with two overlapping 2 mm diameter interlock coils. The microcatheter was then retracted to the to the level pelvic artery and  a selective left colic arteriogram was performed. Microcatheter was removed and a post embolization arteriogram was performed at the level of the IMA Images were reviewed and the procedure was terminated. All wires, catheters and sheaths were removed from the patient. Hemostasis  was achieved at the right groin access site with manual compression. Procedure was complicated by development of a small hematoma at the right groin access site. The patient otherwise tolerated the procedure well without immediate post procedural complication. FINDINGS: Selective inferior mesenteric arteriogram demonstrates conventional branching pattern with ill-defined area of extravasation involving the mid descending colon compatible with findings on preceding CTA. Selective left colic arteriogram redemonstrates the ill-defined area of extravasation within the mid descending colon supplied predominantly via the inferior division of the left colic artery with potential collateral supply from the superior division. Sub selective arteriogram of the inferior division the left colic artery confirms supply to the ill-defined area of extravasation and as such the supplying distal arcade was percutaneously coil embolized. Sub selective arteriogram of the superior division of the left colic artery suggests additional collateral supply to the ill-defined area of extravasation and as such the supplying distal arcade was percutaneously coil embolized. Completion left colic and inferior mesenteric arteriograms are negative for residual areas of extravasation or collateral supply to the mid descending colon. Note, superior mesenteric arteriogram was not performed given exhaustive evaluation on dedicated mesenteric arteriogram performed 3 days prior (01/09/2021). IMPRESSION: Technically successful percutaneous coil embolization of distal arcades arising from both the superior and inferior divisions of the left colic artery supplying the ill-defined  area of intraluminal contrast extravasation involving the mid descending colon compatible with the findings seen on preceding CTA. PLAN: - The patient is to remain flat for 4 hours with right leg straight (until at least 0730). - The patient will continue to experience several additional bloody bowel movements and may continue to require additional resuscitation (as the patient was bleeding both before and during the procedure), however ultimately I am hopeful she will stabilize in the coming days. Electronically Signed   By: Sandi Mariscal M.D.   On: 01/12/2021 07:24   IR US Guide Vasc Access Right  Result Date: 01/12/2021 INDICATION: Recurrent lower GI bleeding. Positive CTA. Please perform mesenteric arteriogram with potential embolization. EXAM: 1. ULTRASOUND GUIDANCE FOR ARTERIAL ACCESS 2. SELECTIVE INFERIOR MESENTERIC ARTERIOGRAM 3. SUB SELECTIVE LEFT COLIC ARTERIOGRAM 4. SUB SELECTIVE DISTAL ARCADE DIVISIONS OF THE LEFT COLIC ARTERY AND PERCUTANEOUS COIL EMBOLIZATION COMPARISON:  CTA abdomen and pelvis - 01/11/2021 Mesenteric arteriogram - 01/09/2021 Nuclear medicine tagged red blood cell bleeding scan - 01/08/2021 CT abdomen and pelvis - 01/08/2021 MEDICATIONS: None ANESTHESIA/SEDATION: Moderate (conscious) sedation was employed during this procedure. A total of Versed 0.5 mg was administered intravenously. Moderate Sedation Time: 60 minutes. The patient's level of consciousness and vital signs were monitored continuously by radiology nursing throughout the procedure under my direct supervision. CONTRAST:  45 cc Omnipaque 300 FLUOROSCOPY TIME:  19 minutes (177 mGy) COMPLICATIONS: SIR Level A - No therapy, no consequence. Procedure complicated by development of a small hematoma at the right groin access site. PROCEDURE: Informed consent was obtained from the patient following explanation of the procedure, risks, benefits and alternatives. All questions were addressed. A time out was performed prior to the  initiation of the procedure. Maximal barrier sterile technique utilized including caps, mask, sterile gowns, sterile gloves, large sterile drape, hand hygiene, and Betadine prep. The right femoral head was marked fluoroscopically. Under sterile conditions and local anesthesia, the right common femoral artery access was performed with a micropuncture needle. Under direct ultrasound guidance, the right common femoral was accessed with a micropuncture kit. An ultrasound image was saved for documentation purposes. This allowed for placement of a 5-French vascular sheath. A  limited arteriogram was performed through the side arm of the sheath confirming appropriate access within the right common femoral artery. Over a Bentson wire, a Mickelson catheter was advanced the caudal aspect of the thoracic aorta where was reformed, back bled and flushed. The Mickelson catheter was then utilized to select the inferior mesenteric artery and a selective superior mesenteric arteriogram was performed. With the use of a fathom 14 microwire, a regular Renegade microcatheter was advanced to the level of the left colic artery and a selective left colic arteriogram was performed. The microcatheter was then utilized to select the inferior division of the left colic artery and a sub selective arteriogram was performed. The microcatheter was then advanced to select the distal arcade supplying the ill-defined area of extravasation involving the mid descending colon. Sub selective injection confirmed appropriate positioning and the vessel was percutaneously coil embolized with two overlapping 2 mm diameter interlock coils. The microcatheter was retracted to the level the left colic artery and a post embolization left colic arteriogram was performed. The microcatheter was then advanced to select the distal arcade felt to potentially be supplying additional collateral supply to the ill-defined area of active extravasation involving the mid  descending colon. Sub selective injection confirmed appropriate positioning and the vessel was percutaneously coil embolized with two overlapping 2 mm diameter interlock coils. The microcatheter was then retracted to the to the level pelvic artery and a selective left colic arteriogram was performed. Microcatheter was removed and a post embolization arteriogram was performed at the level of the IMA Images were reviewed and the procedure was terminated. All wires, catheters and sheaths were removed from the patient. Hemostasis was achieved at the right groin access site with manual compression. Procedure was complicated by development of a small hematoma at the right groin access site. The patient otherwise tolerated the procedure well without immediate post procedural complication. FINDINGS: Selective inferior mesenteric arteriogram demonstrates conventional branching pattern with ill-defined area of extravasation involving the mid descending colon compatible with findings on preceding CTA. Selective left colic arteriogram redemonstrates the ill-defined area of extravasation within the mid descending colon supplied predominantly via the inferior division of the left colic artery with potential collateral supply from the superior division. Sub selective arteriogram of the inferior division the left colic artery confirms supply to the ill-defined area of extravasation and as such the supplying distal arcade was percutaneously coil embolized. Sub selective arteriogram of the superior division of the left colic artery suggests additional collateral supply to the ill-defined area of extravasation and as such the supplying distal arcade was percutaneously coil embolized. Completion left colic and inferior mesenteric arteriograms are negative for residual areas of extravasation or collateral supply to the mid descending colon. Note, superior mesenteric arteriogram was not performed given exhaustive evaluation on dedicated  mesenteric arteriogram performed 3 days prior (01/09/2021). IMPRESSION: Technically successful percutaneous coil embolization of distal arcades arising from both the superior and inferior divisions of the left colic artery supplying the ill-defined area of intraluminal contrast extravasation involving the mid descending colon compatible with the findings seen on preceding CTA. PLAN: - The patient is to remain flat for 4 hours with right leg straight (until at least 0730). - The patient will continue to experience several additional bloody bowel movements and may continue to require additional resuscitation (as the patient was bleeding both before and during the procedure), however ultimately I am hopeful she will stabilize in the coming days. Electronically Signed   By: Eldridge Abrahams.D.  On: 01/12/2021 07:24   IR US Guide Vasc Access Right  Result Date: 01/09/2021 CLINICAL DATA:  GI bleed, localized to proximal descending colon on CTA and scintigraphy EXAM: SELECTIVE VISCERAL ARTERIOGRAPHY; IR ULTRASOUND GUIDANCE VASC ACCESS RIGHT ANESTHESIA/SEDATION: Intravenous Fentanyl 45mg and Versed or 1.554mwere administered as conscious sedation during continuous monitoring of the patient's level of consciousness and physiological / cardiorespiratory status by the radiology RN, with a total moderate sedation time of 41 minutes. MEDICATIONS: Lidocaine 1% subcutaneous CONTRAST:  3073mMNIPAQUE IOHEXOL 300 MG/ML SOLN, 26m102mNIPAQUE IOHEXOL 300 MG/ML SOLN, 32mL60mIPAQUE IOHEXOL 300 MG/ML SOLN PROCEDURE: The procedure, risks (including but not limited to bleeding, infection, organ damage ), benefits, and alternatives were explained to the patient. Questions regarding the procedure were encouraged and answered. The patient understands and consents to the procedure. Right femoral region prepped and draped in usual sterile fashion. Maximal barrier sterile technique was utilized including caps, mask, sterile gowns, sterile  gloves, sterile drape, hand hygiene and skin antiseptic. The right common femoral artery was localized under ultrasound. Under real-time ultrasound guidance, the vessel was accessed with a 21-gauge micropuncture needle, exchanged over a 018 guidewire for a transitional dilator, through which a 035 guidewire was advanced. Over this, a 5 [place]renc[place]ular sheath was placed, through which a 5 Fren[place]atheter was advanced and used to selectively catheterize the superior mesenteric artery for selective arteriography in multiple projections. A coaxial Renegade microcatheter with a fathom guidewire was advanced and used for selective arteriography of 2 third order branches in multiple projections. The catheter and sheath were removed and hemostasis achieved with the aid of the Exoseal device after confirmatory femoral arteriography. The patient tolerated the procedure well. COMPLICATIONS: None immediate FINDINGS: No evidence of active extravasation, early draining vein, AVM, or other lesion to suggest a site or etiology of the patient's GI bleeding. Venous phase confirms patency of the portal venous system. IMPRESSION: 1. Negative superior mesenteric arteriogram. No evidence of active extravasation or other focal lesion to suggest etiology of GI bleed. Electronically Signed   By: D  HaLucrezia Europe   On: 01/09/2021 09:46   DG CHEST PORT 1 VIEW  Result Date: 01/17/2021 CLINICAL DATA:  Recurrent bleed.  Shortness of breath. EXAM: PORTABLE CHEST 1 VIEW COMPARISON:  January 16, 2021 FINDINGS: Diffuse increased hazy and interstitial opacities with bilateral small effusions, unchanged. A left central line terminates in the central SVC. The cardiomediastinal silhouette is unchanged. No pneumothorax. IMPRESSION: The pulmonary opacities and small effusions are most consistent with pulmonary edema/volume overload with mild cardiomegaly. An atypical infection is not completely excluded. Recommend clinical correlation. Electronically  Signed   By: DavidDorise BullionM.D   On: 01/17/2021 15:58   DG CHEST PORT 1 VIEW  Result Date: 01/16/2021 CLINICAL DATA:  Central catheter placement EXAM: PORTABLE CHEST 1 VIEW COMPARISON:  CT angio abdomen and pelvis 01/11/2021 FINDINGS: Left subclavian approach central venous catheter tip terminates in the mid SVC. Telemetry leads and external support devices overlie the chest. Low lung volumes and atelectasis. Layering bilateral effusions. Some patchy and streaky opacities are present throughout both lungs, right greater than left. No visible pneumothorax. The aorta is calcified. The remaining cardiomediastinal contours are unremarkable. No acute osseous or soft tissue abnormality. IMPRESSION: Left subclavian approach central venous catheter tip terminates in the mid SVC. Developing bilateral effusions. Heterogeneous opacities in the lungs, could reflect developing infection and/or edema. Electronically Signed   By: PriceLovena Le   On: 01/16/2021 21:10  ECHOCARDIOGRAM COMPLETE  Result Date: 01/15/2021    ECHOCARDIOGRAM REPORT   Patient Name:   WILEY FLICKER Date of Exam: 01/15/2021 Medical Rec #:  [id number]    Height:       61.5 in Accession #:    [id number]   Weight:       88.0 lb Date of Birth:  11-Apr-1933     BSA:          1.340 m Patient Age:    43 years     BP:           127/78 mmHg Patient Gender: F            HR:           83 bpm. Exam Location:  Inpatient Procedure: 2D Echo, Color Doppler, Cardiac Doppler and 3D Echo Indications:    Other Cardiac Sounds R01.2; Z01.818 Encounter for other                 preprocedural examination  History:        Patient has prior history of Echocardiogram examinations, most                 recent 08/07/2012. COPD; Risk Factors:Hypertension and                 Dyslipidemia.  Sonographer:    Jonelle Sidle Dance Referring Phys: [id number] ELIZABETH [place]  1. There is severe aortic stenosis with AVA 0.84cm2, mean gradient 54 mmHg, peak gradient 73mHg, Vmax  4.825m, DI 0.27.  2. The aortic valve is calcified. There is severe calcifcation of the aortic valve. There is severe thickening of the aortic valve. Aortic valve regurgitation is mild.  3. Left ventricular ejection fraction, by estimation, is 60 to 65%. The left ventricle has normal function. The left ventricle has no regional wall motion abnormalities.  4. There is severe hypertrophy of the basal septum measuring 1.8cm. The rest of the LV segments demonstrate moderate hypertrophy. An intracavitary gradient is present with no evidence of SAM.  5. Left ventricular diastolic parameters are consistent with Grade II diastolic dysfunction (pseudonormalization).  6. Elevated left atrial pressure.  7. Right ventricular systolic function is normal. The right ventricular size is normal.  8. Left atrial size was moderately dilated.  9. There is moderate thickening of the mitral valve leaflet(s). There is mild calcification of the mitral valve leaflet(s). Mild mitral annular calcification. Mild mitral valve regurgitation. 10. The inferior vena cava is dilated in size with >50% respiratory variability, suggesting right atrial pressure of 8 mmHg. Comparison(s): Compared to echo report in 2013, there is now severe aortic stenosis. FINDINGS  Left Ventricle: Left ventricular ejection fraction, by estimation, is 65 to 70%. The left ventricle has normal function. The left ventricle has no regional wall motion abnormalities. The left ventricular internal cavity size was normal in size. There is  servere hypertrophy of the basal septal segments. The rest of the LV segments demonstrate moderate hypertrophy. Left ventricular diastolic parameters are consistent with Grade II diastolic dysfunction (pseudonormalization). Elevated left atrial pressure. The E/e' is 2850Right Ventricle: The right ventricular size is normal. No increase in right ventricular wall thickness. Right ventricular systolic function is normal. Left Atrium: Left atrial  size was moderately dilated. Right Atrium: Right atrial size was normal in size. Pericardium: Trivial pericardial effusion is present. Mitral Valve: The mitral valve is abnormal. There is moderate thickening of the mitral valve leaflet(s). There is mild calcification of the  mitral valve leaflet(s). Mild mitral annular calcification. Mild mitral valve regurgitation. Tricuspid Valve: The tricuspid valve is normal in structure. Tricuspid valve regurgitation is trivial. Aortic Valve: There is severe aortic stenosis with AVA 0.84cm2, mean gradient 54 mmHg, peak gradient 25mHg, Vmax 4.82m, DI 0.27. The aortic valve is calcified. There is severe calcifcation of the aortic valve. There is severe thickening of the aortic valve. Aortic valve regurgitation is mild. Aortic regurgitation PHT measures 373 msec. Pulmonic Valve: The pulmonic valve was normal in structure. Pulmonic valve regurgitation is trivial. Aorta: The aortic root and ascending aorta are structurally normal, with no evidence of dilitation. Venous: The inferior vena cava is dilated in size with greater than 50% respiratory variability, suggesting right atrial pressure of 8 mmHg. IAS/Shunts: No atrial level shunt detected by color flow Doppler.  LEFT VENTRICLE PLAX 2D LVIDd:         3.30 cm  Diastology LVIDs:         2.10 cm  LV e' medial:    5.66 cm/s LV PW:         1.30 cm  LV E/e' medial:  28.6 LV IVS:        1.20 cm  LV e' lateral:   4.35 cm/s LVOT diam:     2.00 cm  LV E/e' lateral: 37.2 LV SV:         83 LV SV Index:   62 LVOT Area:     3.14 cm  RIGHT VENTRICLE             IVC RV Basal diam:  2.30 cm     IVC diam: 2.30 cm RV S prime:     14.10 cm/s TAPSE (M-mode): 1.6 cm LEFT ATRIUM             Index       RIGHT ATRIUM          Index LA diam:        3.70 cm 2.76 cm/m  RA Area:     9.22 cm LA Vol (A2C):   36.5 ml 27.23 ml/m RA Volume:   16.60 ml 12.39 ml/m LA Vol (A4C):   56.0 ml 41.78 ml/m LA Biplane Vol: 46.2 ml 34.47 ml/m  AORTIC VALVE AV Area  (Vmax):    0.84 cm AV Area (Vmean):   0.86 cm AV Area (VTI):     0.93 cm AV Vmax:           424.00 cm/s AV Vmean:          307.200 cm/s AV VTI:            0.885 m AV Peak Grad:      71.9 mmHg AV Mean Grad:      43.6 mmHg LVOT Vmax:         114.00 cm/s LVOT Vmean:        84.300 cm/s LVOT VTI:          0.263 m LVOT/AV VTI ratio: 0.30 AI PHT:            373 msec  AORTA Ao Root diam: 3.20 cm Ao Asc diam:  3.10 cm MITRAL VALVE MV Area (PHT): 2.54 cm     SHUNTS MV Decel Time: 299 msec     Systemic VTI:  0.26 m MV E velocity: 162.00 cm/s  Systemic Diam: 2.00 cm MV A velocity: 145.00 cm/s MV E/A ratio:  1.12 HeGwyndolyn KaufmanD Electronically signed by HeGwyndolyn KaufmanD Signature Date/Time: 01/15/2021/10:43:34 AM  Final    IR EMBO ARTERIAL NOT HEMORR HEMANG INC GUIDE ROADMAPPING  Result Date: 01/12/2021 INDICATION: Recurrent lower GI bleeding. Positive CTA. Please perform mesenteric arteriogram with potential embolization. EXAM: 1. ULTRASOUND GUIDANCE FOR ARTERIAL ACCESS 2. SELECTIVE INFERIOR MESENTERIC ARTERIOGRAM 3. SUB SELECTIVE LEFT COLIC ARTERIOGRAM 4. SUB SELECTIVE DISTAL ARCADE DIVISIONS OF THE LEFT COLIC ARTERY AND PERCUTANEOUS COIL EMBOLIZATION COMPARISON:  CTA abdomen and pelvis - 01/11/2021 Mesenteric arteriogram - 01/09/2021 Nuclear medicine tagged red blood cell bleeding scan - 01/08/2021 CT abdomen and pelvis - 01/08/2021 MEDICATIONS: None ANESTHESIA/SEDATION: Moderate (conscious) sedation was employed during this procedure. A total of Versed 0.5 mg was administered intravenously. Moderate Sedation Time: 60 minutes. The patient's level of consciousness and vital signs were monitored continuously by radiology nursing throughout the procedure under my direct supervision. CONTRAST:  45 cc Omnipaque 300 FLUOROSCOPY TIME:  19 minutes (841 mGy) COMPLICATIONS: SIR Level A - No therapy, no consequence. Procedure complicated by development of a small hematoma at the right groin access site. PROCEDURE:  Informed consent was obtained from the patient following explanation of the procedure, risks, benefits and alternatives. All questions were addressed. A time out was performed prior to the initiation of the procedure. Maximal barrier sterile technique utilized including caps, mask, sterile gowns, sterile gloves, large sterile drape, hand hygiene, and Betadine prep. The right femoral head was marked fluoroscopically. Under sterile conditions and local anesthesia, the right common femoral artery access was performed with a micropuncture needle. Under direct ultrasound guidance, the right common femoral was accessed with a micropuncture kit. An ultrasound image was saved for documentation purposes. This allowed for placement of a 5-French vascular sheath. A limited arteriogram was performed through the side arm of the sheath confirming appropriate access within the right common femoral artery. Over a Bentson wire, a Mickelson catheter was advanced the caudal aspect of the thoracic aorta where was reformed, back bled and flushed. The Mickelson catheter was then utilized to select the inferior mesenteric artery and a selective superior mesenteric arteriogram was performed. With the use of a fathom 14 microwire, a regular Renegade microcatheter was advanced to the level of the left colic artery and a selective left colic arteriogram was performed. The microcatheter was then utilized to select the inferior division of the left colic artery and a sub selective arteriogram was performed. The microcatheter was then advanced to select the distal arcade supplying the ill-defined area of extravasation involving the mid descending colon. Sub selective injection confirmed appropriate positioning and the vessel was percutaneously coil embolized with two overlapping 2 mm diameter interlock coils. The microcatheter was retracted to the level the left colic artery and a post embolization left colic arteriogram was performed. The  microcatheter was then advanced to select the distal arcade felt to potentially be supplying additional collateral supply to the ill-defined area of active extravasation involving the mid descending colon. Sub selective injection confirmed appropriate positioning and the vessel was percutaneously coil embolized with two overlapping 2 mm diameter interlock coils. The microcatheter was then retracted to the to the level pelvic artery and a selective left colic arteriogram was performed. Microcatheter was removed and a post embolization arteriogram was performed at the level of the IMA Images were reviewed and the procedure was terminated. All wires, catheters and sheaths were removed from the patient. Hemostasis was achieved at the right groin access site with manual compression. Procedure was complicated by development of a small hematoma at the right groin access site. The patient otherwise tolerated the  procedure well without immediate post procedural complication. FINDINGS: Selective inferior mesenteric arteriogram demonstrates conventional branching pattern with ill-defined area of extravasation involving the mid descending colon compatible with findings on preceding CTA. Selective left colic arteriogram redemonstrates the ill-defined area of extravasation within the mid descending colon supplied predominantly via the inferior division of the left colic artery with potential collateral supply from the superior division. Sub selective arteriogram of the inferior division the left colic artery confirms supply to the ill-defined area of extravasation and as such the supplying distal arcade was percutaneously coil embolized. Sub selective arteriogram of the superior division of the left colic artery suggests additional collateral supply to the ill-defined area of extravasation and as such the supplying distal arcade was percutaneously coil embolized. Completion left colic and inferior mesenteric arteriograms are  negative for residual areas of extravasation or collateral supply to the mid descending colon. Note, superior mesenteric arteriogram was not performed given exhaustive evaluation on dedicated mesenteric arteriogram performed 3 days prior (01/09/2021). IMPRESSION: Technically successful percutaneous coil embolization of distal arcades arising from both the superior and inferior divisions of the left colic artery supplying the ill-defined area of intraluminal contrast extravasation involving the mid descending colon compatible with the findings seen on preceding CTA. PLAN: - The patient is to remain flat for 4 hours with right leg straight (until at least 0730). - The patient will continue to experience several additional bloody bowel movements and may continue to require additional resuscitation (as the patient was bleeding both before and during the procedure), however ultimately I am hopeful she will stabilize in the coming days. Electronically Signed   By: Sandi Mariscal M.D.   On: 01/12/2021 07:24   CT Angio Abd/Pel W and/or Wo Contrast  Result Date: 01/11/2021 CLINICAL DATA:  Gastrointestinal bleed.  bright red blood. EXAM: CTA ABDOMEN AND PELVIS WITHOUT AND WITH CONTRAST TECHNIQUE: Multidetector CT imaging of the abdomen and pelvis was performed using the standard protocol during bolus administration of intravenous contrast. Multiplanar reconstructed images and MIPs were obtained and reviewed to evaluate the vascular anatomy. CONTRAST:  129m OMNIPAQUE IOHEXOL 350 MG/ML SOLN COMPARISON:  Nuclear medicine gastrointestinal bleeding scan 01/08/2021, CT abdomen pelvis 01/08/2021 FINDINGS: VASCULAR Aorta: Severe atherosclerotic plaque. Normal caliber aorta without aneurysm, dissection, vasculitis or significant stenosis. Celiac: Patent without evidence of aneurysm, dissection, vasculitis or significant stenosis. SMA: Patent without evidence of aneurysm, dissection, vasculitis or significant stenosis. Renals: Mild  atherosclerotic plaque of the origins. Both renal arteries are patent without evidence of aneurysm, dissection, vasculitis, fibromuscular dysplasia or significant stenosis. IMA: Patent without evidence of aneurysm, dissection, vasculitis or significant stenosis. Inflow: Severe atherosclerotic plaque. Patent without evidence of aneurysm, dissection, vasculitis or significant stenosis. Proximal Outflow: At least moderate atherosclerotic plaque. Bilateral common femoral and visualized portions of the superficial and profunda femoral arteries are patent without evidence of aneurysm, dissection, vasculitis or significant stenosis. Veins: The hepatic, portal, splenic, superior mesenteric veins are patent. NON-VASCULAR Lower chest: Interval development of a 1.6 x 1.3 cm right lower lobe pulmonary nodular like consolidation likely represents atelectasis (not visualized on CT 2 days ago. Subsegmental atelectasis within bilateral lower lobes. Trace left pleural effusion. Hepatobiliary: No focal liver abnormality. No gallstones, gallbladder wall thickening, or pericholecystic fluid. No biliary dilatation. Pancreas: No focal lesion. Normal pancreatic contour. No surrounding inflammatory changes. No main pancreatic ductal dilatation. Spleen: Normal in size without focal abnormality. Adrenals/Urinary Tract: No adrenal nodule bilaterally. Bilateral kidneys enhance symmetrically. Subcentimeter hypodensities within the kidneys are too small to characterize.  No hydronephrosis. No hydroureter. The urinary bladder is unremarkable. Stomach/Bowel: Left distal descending colon/sigmoid colon reanastomosis. Layering hyperdensity within the mid to distal descending colon is noted to slightly increase in density on the portal venous phase but is also not visualized on noncontrast study. Associated slightly hyperdense material within the colonic lumen distally. The colon is noted to be fluid-filled past the splenic flexure. No bowel wall  thickening or dilatation. The appendix not definitely identified. Lymphatic: No lymphadenopathy. Reproductive: Status post hysterectomy. No adnexal masses. Other: No intraperitoneal free fluid. No intraperitoneal free gas. No organized fluid collection. Musculoskeletal: No abdominal wall hernia or abnormality. No suspicious lytic or blastic osseous lesions. No acute displaced fracture. Multilevel degenerative changes of the spine. 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 IMPRESSION: VASCULAR Aortic Atherosclerosis (ICD10-I70.0). NON-VASCULAR Mid to distal descending colon gastrointestinal hemorrhage that is located proximal to the colonic anastomosis. Electronically Signed   By: Iven Finn M.D.   On: 01/11/2021 22:36    Lab Data:  CBC: Recent Labs  Lab 01/18/21 0526 01/19/21 0403 01/20/21 0706 01/21/21 0553 01/22/21 0300  WBC 16.9* 13.3* 13.3* 15.9* 14.9*  HGB 9.1* 8.7* 9.8* 9.7* 8.9*  HCT 26.9* 25.9* 30.5* 30.6* 27.6*  MCV 87.9 87.5 90.8 90.8 90.5  PLT 290 369 504* 686* 678*   Basic Metabolic Panel: Recent Labs  Lab 01/17/21 0457 01/18/21 0526 01/19/21 0403 01/19/21 1346 01/20/21 0706 01/21/21 0553 01/22/21 0300  NA 132* 133* 131*  --  133* 135 136  K 3.4* 4.6 2.9* 4.0 4.6 4.2 3.8  CL 104 102 94*  --  95* 94* 94*  CO2 22 25 31   --  32 30 36*  GLUCOSE 163* 102* 145*  --  109* 91 107*  BUN 12 13 7*  --  10 16 26*  CREATININE 0.58 0.54 0.61  --  0.66 0.80 0.82  CALCIUM 6.7* 6.9* 6.9*  --  7.7* 7.8* 7.8*  MG 1.6* 1.9 1.5*  --  2.2  --   --    GFR: Estimated Creatinine Clearance: 38.8 mL/min (by C-G formula based on SCr of 0.82 mg/dL). Liver Function Tests: Recent Labs  Lab 01/16/21 0539 01/17/21 0457 01/21/21 0553  AST 19 24 15   ALT 13 14 15   ALKPHOS 39 33* 46  BILITOT 0.7 1.7* 1.0  PROT 4.6* 3.8* 4.5*  ALBUMIN 2.0* 1.7* 1.9*   No results for input(s): LIPASE, AMYLASE in the last 168 hours. No results for  input(s): AMMONIA in the last 168 hours. Coagulation Profile: Recent Labs  Lab 01/16/21 2259  INR 1.4*   Cardiac Enzymes: No results for input(s): CKTOTAL, CKMB, CKMBINDEX, TROPONINI in the last 168 hours. BNP (last 3 results) No results for input(s): PROBNP in the last 8760 hours. HbA1C: No results for input(s): HGBA1C in the last 72 hours. CBG: Recent Labs  Lab 01/21/21 1151 01/21/21 1711 01/21/21 2118 01/22/21 0752 01/22/21 1129  GLUCAP 195* 146* 118* 96 139*   Lipid Profile: No results for input(s): CHOL, HDL, LDLCALC, TRIG, CHOLHDL, LDLDIRECT in the last 72 hours. Thyroid Function Tests: No results for input(s): TSH, T4TOTAL, FREET4, T3FREE, THYROIDAB in the last 72 hours. Anemia Panel: No results for input(s): VITAMINB12, FOLATE, FERRITIN, TIBC, IRON, RETICCTPCT in the last 72 hours. Urine analysis:    Component Value Date/Time   COLORURINE YELLOW 03/09/2013 2028   APPEARANCEUR CLEAR 03/09/2013 2028   LABSPEC 1.036 (H) 03/09/2013 2028   PHURINE 6.5 03/09/2013 2028  GLUCOSEU 500 (A) 03/09/2013 2028   HGBUR NEGATIVE 03/09/2013 2028   BILIRUBINUR NEGATIVE 03/09/2013 2028   KETONESUR 15 (A) 03/09/2013 2028   PROTEINUR NEGATIVE 03/09/2013 2028   UROBILINOGEN 1.0 03/09/2013 2028   NITRITE NEGATIVE 03/09/2013 2028   LEUKOCYTESUR NEGATIVE 03/09/2013 2028     Ripudeep Rai M.D. [facility] 01/22/2021, 3:29 PM  Available via Epic secure chat 7am-7pm After 7 pm, please refer to night coverage provider listed on amion.

## 2021-01-22 NOTE — Progress Notes (Signed)
5 Days Post-Op    CC:  GI bleed  Subjective: Her ostomy bag is full this AM, so I'm guessing her output is more than the 725 recorded.  I am watching her weight also.  She had some soft food yesterday and she said it just didn't taste all that good.  I talked with her daughter about foods she could bring in also.  She is still frail in appearance but said she walked in the halls yesterday.    Objective: Vital signs in last 24 hours: Temp:  [97.5 F (36.4 C)-99.2 F (37.3 C)] 97.5 F (36.4 C) (03/10 0400) Pulse Rate:  [65-86] 77 (03/10 0530) Resp:  [11-28] 14 (03/10 0530) BP: (80-118)/(45-80) 109/45 (03/10 0530) SpO2:  [91 %-98 %] 92 % (03/10 0530) Weight:  [53.8 kg] 53.8 kg (03/10 0320) Last BM Date: 01/21/21 200 Po recorded Stool 725 - ileostomy Afebrile, VSS WBC 15.9>>14.9 H/H 9.7/30.6>>8.9/27.6  Intake/Output from previous day: 03/09 0701 - 03/10 0700 In: 200 [P.O.:200] Out: 725 [Stool:725] Intake/Output this shift: No intake/output data recorded.  General appearance: alert, cooperative and no distress Resp: clear to auscultation bilaterally GI: soft, sore, open wound OK, ostomy wtih bag full of fluid this Am  Lab Results:  Recent Labs    01/21/21 0553 01/22/21 0300  WBC 15.9* 14.9*  HGB 9.7* 8.9*  HCT 30.6* 27.6*  PLT 686* 639*    BMET Recent Labs    01/21/21 0553 01/22/21 0300  NA 135 136  K 4.2 3.8  CL 94* 94*  CO2 30 36*  GLUCOSE 91 107*  BUN 16 26*  CREATININE 0.80 0.82  CALCIUM 7.8* 7.8*   PT/INR No results for input(s): LABPROT, INR in the last 72 hours.  Recent Labs  Lab 01/16/21 0539 01/17/21 0457 01/21/21 0553  AST 19 24 15   ALT 13 14 15   ALKPHOS 39 33* 46  BILITOT 0.7 1.7* 1.0  PROT 4.6* 3.8* 4.5*  ALBUMIN 2.0* 1.7* 1.9*     Lipase  No results found for: LIPASE   Medications:  sodium chloride   Intravenous Once   sodium chloride   Intravenous Once   acetaminophen  1,000 mg Oral Q6H   budesonide  6 mg Oral Daily    calcium carbonate  1,250 mg Oral Daily   Chlorhexidine Gluconate Cloth  6 each Topical Daily   enoxaparin (LOVENOX) injection  30 mg Subcutaneous Daily   feeding supplement  237 mL Oral BID BM   ferrous sulfate  325 mg Oral BID WC   furosemide  40 mg Intravenous BID   insulin aspart  0-15 Units Subcutaneous TID WC   metoprolol tartrate  25 mg Oral BID   multivitamin with minerals  1 tablet Oral Daily   pantoprazole (PROTONIX) IV  40 mg Intravenous Q24H   pravastatin  10 mg Oral Daily   psyllium  1 packet Oral BID   sodium chloride flush  10-40 mL Intracatheter Q12H    Assessment/Plan Severe aortic stenosis AVA 0.84 cm/EF 60 to 85%/YIFOY 2 diastolic dysfunction(ECHO 01/15/2021)  - Weight: 39.9>> 55>> 52.5 kg>>53.8kg COPD-ongoing tobacco use/59-pack-year history Acute respiratory failure with hypoxia/fluid overload Hypertension/hypotension Hx collagenous colitis-budesonidepreadmit Anemia - transfusedx3  - H/H 9.9/29.7>>8.7/25.9>>9.7/30.6>>8.9/27.6 Hyperlipidemia Moderate malnutrition/deconditioning  - prealbumin 10.7 Hyperglycemia  -Hemoglobin A1c 7.4   Ischemic colitis; ischemic mass at the splenic flexure/dense intra-abdominal adhesions S/P colonoscopy, cold snare polypectomy and AVM ablation 01/10/2021; S/P angio embolization for GI bleed 01/12/2021 Exploratory laparotomy with partial colectomy and colostomy 01/15/2021 Dr.  Thomas Cornett. POD #7 Exploratory laparotomy-completion colectomy/ileostomy 01/17/2021 Dr. Ralene Ok, POD #5  FEN: soft dietIV fluids on hold ID: Cefotetan preop; Cipro/Flagyl 3/2-3/7  - WBC 17.0(3/2)>>13.1>>15.0>>16.9>>13.3>>15.9(3/9)>>14.9 DVT: SCDs/Lovenox  Plan:  Continue soft diet, continue to mobilize, watch ileostomy output.  Watch BP with severe AS/AF.       LOS: 11 days    Kathleen Pope 01/22/2021 Please see Amion

## 2021-01-23 LAB — GLUCOSE, CAPILLARY
Glucose-Capillary: 93 mg/dL (ref 70–99)
Glucose-Capillary: 140 mg/dL — ABNORMAL HIGH (ref 70–99)
Glucose-Capillary: 150 mg/dL — ABNORMAL HIGH (ref 70–99)

## 2021-01-23 MED ORDER — METHOCARBAMOL 500 MG PO TABS
500.0000 mg | ORAL_TABLET | Freq: Three times a day (TID) | ORAL | Status: DC | PRN
  Administered 2021-01-26: 500 mg via ORAL
  Filled 2021-01-23: qty 1

## 2021-01-23 MED ORDER — NYSTATIN 100000 UNIT/GM EX OINT
TOPICAL_OINTMENT | Freq: Two times a day (BID) | CUTANEOUS | Status: DC
  Administered 2021-01-27: 1 via TOPICAL
  Filled 2021-01-23: qty 15
  Filled 2021-01-23 (×2): qty 30

## 2021-01-23 MED ORDER — LOPERAMIDE HCL 2 MG PO CAPS
2.0000 mg | ORAL_CAPSULE | Freq: Two times a day (BID) | ORAL | Status: DC
  Administered 2021-01-23 – 2021-01-26 (×7): 2 mg via ORAL
  Filled 2021-01-23 (×7): qty 1

## 2021-01-23 NOTE — Progress Notes (Signed)
[facility] consult progress note                                                                               Patient Demographics  Kathleen Pope, is a 87 y.o. female, DOB - 1933-07-11, [id number]  Admit date - 01/11/2021   Admitting Physician Elwyn Reach, MD  Outpatient Primary MD for the patient is Redmond School, MD  Outpatient specialists:   LOS - 12  days   Medical records reviewed and are as summarized below:    Chief Complaint  Patient presents with  . GI Bleeding       Brief summary   Kathleen Pope is a 87 y.o. 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 Patient presented secondary recurrent GI bleeding. Patient underwent coil embolization which unfortunately appears to have left to ischemic colon requiring partial colectomy. After partial colectomy, patient developed hypotension and brisk GI bleeding requiring total colectomy.    Assessment & Plan   Primary problem Lower GI bleeding with likely diverticular bleed, acute blood loss anemia -Presented with, recently admitted with similar episode. -Status post mesenteric arteriogram with successful coil embolization of left colic artery on 4/81 -Recurrent large-volume episode of hematochezia on 3/1 overnight.  GI was consulted again and performed a flexible sigmoidoscopy on 3/2 which showed colonic ischemia -After partial colectomy, had recurrent GI bleeding.  Colonoscopy at bedside unsuccessful in identifying the lesion secondary to previous blood and was recommended colectomy, performed on 3/5 -Received 7 units packed RBCs for GI bleeding in addition to palliative transfusions -H&H currently stable, will repeat labs in the morning   Active problems Colonic ischemia -Seen on endoscopy.  General surgery consulted -Status post ex lap with partial colectomy and end colostomy on 3/3. -NG tube removed, underwent  total colectomy with end ileostomy on 3/5 -Poor p.o. intake but tolerating diet   Hypovolemic shock, hemorrhagic shock, hypotension -Secondary to blood loss from GI bleeding, requiring aggressive fluid, blood resuscitation in addition to short-term vasopressor support -Lasix discontinued, BP now stable.  Acute respiratory failure with hypoxia secondary to fluid overload -Likely due to overload from hyper obesity transfusions, IV fluid.  Net + 10 L with maximum 15 kg weight gain -Patient was placed on a Lasix 40 mg IV twice daily, has severe aortic stenosis, cautious diuresis.   -Currently on room air, O2 sats 91 to 93%.  BP still soft but improving -Hold off on Lasix IV for now.     Severe aortic stenosis Seen on Transthoracic Echocardiogram. Will need outpatient cardiology follow-up, follows Dr. Johney Frame, last seen on 12/17/2020.  Hyperglycemia -Hemoglobin A1c 7.4, continue SSI  -  will benefit from Metformin on discharge  COPD -Currently no wheezing,  -Has history of nicotine use    Essential hypertension -BP currently soft, continue to hold losartan.  Hold Lasix.   Collagenous colitis -Takes budesonide as an outpatient, currently on prescribed dose of 6 mg daily  Leukocytosis -Could be due to budesonide, currently no fevers, improving  Hyperlipidemia -Continue statin  Thrombocytosis -Likely reactive, improving     Protein-calorie malnutrition,  severe -Albumin 1.9, recurrent GI bleeding chronic illness.  BMI 21 -Continue nutritional supplements    Code Status: DNR DVT Prophylaxis:  enoxaparin (LOVENOX) injection 30 mg Start: 01/20/21 1100 Place and maintain sequential compression device Start: 01/15/21 1610 SCDs Start: 01/12/21 0237   Level of Care: Level of care: Telemetry Family Communication: Discussed all imaging results, lab results, explained to the patient    Disposition Plan:     Status is: Inpatient per surgery  Remains inpatient  appropriate because:Inpatient level of care appropriate due to severity of illness   Time Spent in minutes 35 minutes  Procedures:   POST MESENTERIC ARTERIOGRAM AND PERCUTANEOUS EMBOLIZATION (01/12/2021)  EGD/FLEXIBLE SIGMOIDOSCOPY (01/15/2021)  PARTIAL COLECTOMY (01/15/2021)   Antimicrobials:   Anti-infectives (From admission, onward)   Start     Dose/Rate Route Frequency Ordered Stop   01/15/21 1800  ciprofloxacin (CIPRO) IVPB 400 mg  Status:  Discontinued        400 mg 200 mL/hr over 60 Minutes Intravenous Every 12 hours 01/15/21 1609 01/19/21 0817   01/15/21 1700  metroNIDAZOLE (FLAGYL) IVPB 500 mg  Status:  Discontinued        500 mg 100 mL/hr over 60 Minutes Intravenous Every 8 hours 01/15/21 1609 01/19/21 0817   01/15/21 0800  cefoTEtan (CEFOTAN) 2 g in sodium chloride 0.9 % 100 mL IVPB        2 g 200 mL/hr over 30 Minutes Intravenous On call to O.R. 01/15/21 0705 01/15/21 1145         Medications  Scheduled Meds: . acetaminophen  1,000 mg Oral Q6H  . budesonide  6 mg Oral Daily  . calcium carbonate  1,250 mg Oral Daily  . Chlorhexidine Gluconate Cloth  6 each Topical Daily  . enoxaparin (LOVENOX) injection  30 mg Subcutaneous Daily  . feeding supplement  237 mL Oral BID BM  . ferrous sulfate  325 mg Oral BID WC  . insulin aspart  0-15 Units Subcutaneous TID WC  . loperamide  2 mg Oral BID  . metoprolol tartrate  25 mg Oral BID  . multivitamin with minerals  1 tablet Oral Daily  . nystatin ointment   Topical BID  . pantoprazole (PROTONIX) IV  40 mg Intravenous Q24H  . pravastatin  10 mg Oral Daily  . psyllium  1 packet Oral BID  . sodium chloride flush  10-40 mL Intracatheter Q12H   Continuous Infusions: . sodium chloride Stopped (01/16/21 0641)   PRN Meds:.sodium chloride, albuterol, lip balm, methocarbamol, morphine injection, ondansetron **OR** ondansetron (ZOFRAN) IV, oxyCODONE, sodium chloride flush      Subjective:   Kathleen Pope was seen and  examined today.  Seen earlier in the morning, wanted to rest and sleep.  Still very frail and deconditioned.  BP still soft.  Off O2.  No acute chest pain or shortness of breath.    Objective:   Vitals:   01/23/21 1000 01/23/21 1021 01/23/21 1100 01/23/21 1200  BP: (!) 93/49 (!) 93/49 (!) 110/56   Pulse: 89 83 79   Resp: 13  17   Temp:    98.3 F (36.8 C)  TempSrc:    Oral  SpO2: 93%  95%   Weight:      Height:        Intake/Output Summary (Last 24 hours) at 01/23/2021 1344 Last data filed at 01/23/2021 0845 Gross per 24 hour  Intake --  Output 1500 ml  Net -1500 ml     Wt Readings from  Last 3 Encounters:  01/23/21 53.8 kg  01/08/21 40.4 kg  12/29/20 40.4 kg   Physical Exam  General: Alert and oriented x 3, NAD  Cardiovascular: S1 S2 clear, 3/6 systolic murmur radiating to carotids  Respiratory: Diminished breath sound at the bases  Gastrointestinal: Soft, dressing intact, ileostomy  Ext: no pedal edema bilaterally  Neuro: no new deficits  Musculoskeletal: No cyanosis, clubbing  Skin: No rashes  Psych: Sleepy but easily arousable and oriented   Data Reviewed:  I have personally reviewed following labs and imaging studies  Micro Results No results found for this or any previous visit (from the past 240 hour(s)).  Radiology Reports NM GI Blood Loss  Addendum Date: 01/08/2021   ADDENDUM REPORT: 01/08/2021 22:08 ADDENDUM: These results were discussed by telephone at the time of interpretation on 01/08/2021 at 10:07 pm to Dr. Havery Moros, who verbally acknowledged these results. Electronically Signed   By: Anner Crete M.D.   On: 01/08/2021 22:08   Result Date: 01/08/2021 CLINICAL DATA:  87 year old female with lower GI bleed. EXAM: NUCLEAR MEDICINE GASTROINTESTINAL BLEEDING SCAN TECHNIQUE: Sequential abdominal images were obtained following intravenous administration of Tc-43mlabeled red blood cells. RADIOPHARMACEUTICALS:  19.8 mCi Tc-980mertechnetate  in-vitro labeled red cells. COMPARISON:  CT abdomen pelvis dated 01/08/2021. FINDINGS: There is physiologic uptake within the liver, kidneys, and cardiac blood pool. Physiologic activity noted in the major vasculature. There is radiotracer activity in the left lower quadrant conforming to the distal descending colon and sigmoid consistent with active GI bleed. Please note active diverticular bleed in the region of the mid descending colon is present on the earlier CT. IMPRESSION: Active GI bleed in the mid to distal descending colon corresponding to the diverticular bleed on the earlier CT. Electronically Signed: By: ArAnner Crete.D. On: 01/08/2021 21:11   CT ABDOMEN PELVIS W CONTRAST  Result Date: 01/08/2021 CLINICAL DATA:  Melena. EXAM: CT ABDOMEN AND PELVIS WITH CONTRAST TECHNIQUE: Multidetector CT imaging of the abdomen and pelvis was performed using the standard protocol following bolus administration of intravenous contrast. CONTRAST:  7565mMNIPAQUE IOHEXOL 300 MG/ML  SOLN COMPARISON:  None. FINDINGS: Lower chest: Minimal left pleural effusion is noted. Bibasilar subsegmental atelectasis is noted. Hepatobiliary: No focal liver abnormality is seen. No gallstones, gallbladder wall thickening, or biliary dilatation. Pancreas: Unremarkable. No pancreatic ductal dilatation or surrounding inflammatory changes. Spleen: Normal in size without focal abnormality. Adrenals/Urinary Tract: Adrenal glands are unremarkable. Kidneys are normal, without renal calculi, focal lesion, or hydronephrosis. Bladder is unremarkable. Stomach/Bowel: The stomach and appendix appear normal. No small bowel dilatation is noted. Postsurgical changes are seen involving the descending colon. The colon is dilated and fluid-filled. Moderate amount of stool is seen throughout the colon. No definite wall thickening is noted. Vascular/Lymphatic: Aortic atherosclerosis. No enlarged abdominal or pelvic lymph nodes. Reproductive: Uterus and  bilateral adnexa are unremarkable. Other: No abdominal wall hernia or abnormality. No abdominopelvic ascites. Musculoskeletal: Grade 1 anterolisthesis of L5-S1 is noted secondary to bilateral L5 spondylolysis. No acute osseous abnormality is noted. IMPRESSION: 1. Minimal left pleural effusion is noted with bibasilar subsegmental atelectasis. 2. Postsurgical changes are seen involving the descending colon. The colon is dilated and fluid-filled. Moderate amount of stool is seen throughout the colon. No definite wall thickening is noted. 3. Grade 1 anterolisthesis of L5-S1 secondary to bilateral L5 spondylolysis. Aortic Atherosclerosis (ICD10-I70.0). Electronically Signed   By: JamMarijo ConceptionD.   On: 01/08/2021 14:20   IR Angiogram Visceral Selective  Result Date:  01/12/2021 INDICATION: Recurrent lower GI bleeding. Positive CTA. Please perform mesenteric arteriogram with potential embolization. EXAM: 1. ULTRASOUND GUIDANCE FOR ARTERIAL ACCESS 2. SELECTIVE INFERIOR MESENTERIC ARTERIOGRAM 3. SUB SELECTIVE LEFT COLIC ARTERIOGRAM 4. SUB SELECTIVE DISTAL ARCADE DIVISIONS OF THE LEFT COLIC ARTERY AND PERCUTANEOUS COIL EMBOLIZATION COMPARISON:  CTA abdomen and pelvis - 01/11/2021 Mesenteric arteriogram - 01/09/2021 Nuclear medicine tagged red blood cell bleeding scan - 01/08/2021 CT abdomen and pelvis - 01/08/2021 MEDICATIONS: None ANESTHESIA/SEDATION: Moderate (conscious) sedation was employed during this procedure. A total of Versed 0.5 mg was administered intravenously. Moderate Sedation Time: 60 minutes. The patient's level of consciousness and vital signs were monitored continuously by radiology nursing throughout the procedure under my direct supervision. CONTRAST:  45 cc Omnipaque 300 FLUOROSCOPY TIME:  19 minutes (626 mGy) COMPLICATIONS: SIR Level A - No therapy, no consequence. Procedure complicated by development of a small hematoma at the right groin access site. PROCEDURE: Informed consent was obtained  from the patient following explanation of the procedure, risks, benefits and alternatives. All questions were addressed. A time out was performed prior to the initiation of the procedure. Maximal barrier sterile technique utilized including caps, mask, sterile gowns, sterile gloves, large sterile drape, hand hygiene, and Betadine prep. The right femoral head was marked fluoroscopically. Under sterile conditions and local anesthesia, the right common femoral artery access was performed with a micropuncture needle. Under direct ultrasound guidance, the right common femoral was accessed with a micropuncture kit. An ultrasound image was saved for documentation purposes. This allowed for placement of a 5-French vascular sheath. A limited arteriogram was performed through the side arm of the sheath confirming appropriate access within the right common femoral artery. Over a Bentson wire, a Mickelson catheter was advanced the caudal aspect of the thoracic aorta where was reformed, back bled and flushed. The Mickelson catheter was then utilized to select the inferior mesenteric artery and a selective superior mesenteric arteriogram was performed. With the use of a fathom 14 microwire, a regular Renegade microcatheter was advanced to the level of the left colic artery and a selective left colic arteriogram was performed. The microcatheter was then utilized to select the inferior division of the left colic artery and a sub selective arteriogram was performed. The microcatheter was then advanced to select the distal arcade supplying the ill-defined area of extravasation involving the mid descending colon. Sub selective injection confirmed appropriate positioning and the vessel was percutaneously coil embolized with two overlapping 2 mm diameter interlock coils. The microcatheter was retracted to the level the left colic artery and a post embolization left colic arteriogram was performed. The microcatheter was then advanced to  select the distal arcade felt to potentially be supplying additional collateral supply to the ill-defined area of active extravasation involving the mid descending colon. Sub selective injection confirmed appropriate positioning and the vessel was percutaneously coil embolized with two overlapping 2 mm diameter interlock coils. The microcatheter was then retracted to the to the level pelvic artery and a selective left colic arteriogram was performed. Microcatheter was removed and a post embolization arteriogram was performed at the level of the IMA Images were reviewed and the procedure was terminated. All wires, catheters and sheaths were removed from the patient. Hemostasis was achieved at the right groin access site with manual compression. Procedure was complicated by development of a small hematoma at the right groin access site. The patient otherwise tolerated the procedure well without immediate post procedural complication. FINDINGS: Selective inferior mesenteric arteriogram demonstrates conventional branching pattern  with ill-defined area of extravasation involving the mid descending colon compatible with findings on preceding CTA. Selective left colic arteriogram redemonstrates the ill-defined area of extravasation within the mid descending colon supplied predominantly via the inferior division of the left colic artery with potential collateral supply from the superior division. Sub selective arteriogram of the inferior division the left colic artery confirms supply to the ill-defined area of extravasation and as such the supplying distal arcade was percutaneously coil embolized. Sub selective arteriogram of the superior division of the left colic artery suggests additional collateral supply to the ill-defined area of extravasation and as such the supplying distal arcade was percutaneously coil embolized. Completion left colic and inferior mesenteric arteriograms are negative for residual areas of  extravasation or collateral supply to the mid descending colon. Note, superior mesenteric arteriogram was not performed given exhaustive evaluation on dedicated mesenteric arteriogram performed 3 days prior (01/09/2021). IMPRESSION: Technically successful percutaneous coil embolization of distal arcades arising from both the superior and inferior divisions of the left colic artery supplying the ill-defined area of intraluminal contrast extravasation involving the mid descending colon compatible with the findings seen on preceding CTA. PLAN: - The patient is to remain flat for 4 hours with right leg straight (until at least 0730). - The patient will continue to experience several additional bloody bowel movements and may continue to require additional resuscitation (as the patient was bleeding both before and during the procedure), however ultimately I am hopeful she will stabilize in the coming days. Electronically Signed   By: Sandi Mariscal M.D.   On: 01/12/2021 07:24   IR Angiogram Visceral Selective  Result Date: 01/09/2021 CLINICAL DATA:  GI bleed, localized to proximal descending colon on CTA and scintigraphy EXAM: SELECTIVE VISCERAL ARTERIOGRAPHY; IR ULTRASOUND GUIDANCE VASC ACCESS RIGHT ANESTHESIA/SEDATION: Intravenous Fentanyl 59mg and Versed or 1.552mwere administered as conscious sedation during continuous monitoring of the patient's level of consciousness and physiological / cardiorespiratory status by the radiology RN, with a total moderate sedation time of 41 minutes. MEDICATIONS: Lidocaine 1% subcutaneous CONTRAST:  3053mMNIPAQUE IOHEXOL 300 MG/ML SOLN, 73m8mNIPAQUE IOHEXOL 300 MG/ML SOLN, 32mL81mIPAQUE IOHEXOL 300 MG/ML SOLN PROCEDURE: The procedure, risks (including but not limited to bleeding, infection, organ damage ), benefits, and alternatives were explained to the patient. Questions regarding the procedure were encouraged and answered. The patient understands and consents to the  procedure. Right femoral region prepped and draped in usual sterile fashion. Maximal barrier sterile technique was utilized including caps, mask, sterile gowns, sterile gloves, sterile drape, hand hygiene and skin antiseptic. The right common femoral artery was localized under ultrasound. Under real-time ultrasound guidance, the vessel was accessed with a 21-gauge micropuncture needle, exchanged over a 018 guidewire for a transitional dilator, through which a 035 guidewire was advanced. Over this, a 5 Frenc[place]ular sheath was placed, through which a 5 Frenc[place]atheter was advanced and used to selectively catheterize the superior mesenteric artery for selective arteriography in multiple projections. A coaxial Renegade microcatheter with a fathom guidewire was advanced and used for selective arteriography of 2 third order branches in multiple projections. The catheter and sheath were removed and hemostasis achieved with the aid of the Exoseal device after confirmatory femoral arteriography. The patient tolerated the procedure well. COMPLICATIONS: None immediate FINDINGS: No evidence of active extravasation, early draining vein, AVM, or other lesion to suggest a site or etiology of the patient's GI bleeding. Venous phase confirms patency of the portal venous system. IMPRESSION: 1. Negative superior  mesenteric arteriogram. No evidence of active extravasation or other focal lesion to suggest etiology of GI bleed. Electronically Signed   By: Lucrezia Europe M.D.   On: 01/09/2021 09:46   IR Angiogram Selective Each Additional Vessel  Result Date: 01/12/2021 INDICATION: Recurrent lower GI bleeding. Positive CTA. Please perform mesenteric arteriogram with potential embolization. EXAM: 1. ULTRASOUND GUIDANCE FOR ARTERIAL ACCESS 2. SELECTIVE INFERIOR MESENTERIC ARTERIOGRAM 3. SUB SELECTIVE LEFT COLIC ARTERIOGRAM 4. SUB SELECTIVE DISTAL ARCADE DIVISIONS OF THE LEFT COLIC ARTERY AND PERCUTANEOUS COIL EMBOLIZATION COMPARISON:   CTA abdomen and pelvis - 01/11/2021 Mesenteric arteriogram - 01/09/2021 Nuclear medicine tagged red blood cell bleeding scan - 01/08/2021 CT abdomen and pelvis - 01/08/2021 MEDICATIONS: None ANESTHESIA/SEDATION: Moderate (conscious) sedation was employed during this procedure. A total of Versed 0.5 mg was administered intravenously. Moderate Sedation Time: 60 minutes. The patient's level of consciousness and vital signs were monitored continuously by radiology nursing throughout the procedure under my direct supervision. CONTRAST:  45 cc Omnipaque 300 FLUOROSCOPY TIME:  19 minutes (784 mGy) COMPLICATIONS: SIR Level A - No therapy, no consequence. Procedure complicated by development of a small hematoma at the right groin access site. PROCEDURE: Informed consent was obtained from the patient following explanation of the procedure, risks, benefits and alternatives. All questions were addressed. A time out was performed prior to the initiation of the procedure. Maximal barrier sterile technique utilized including caps, mask, sterile gowns, sterile gloves, large sterile drape, hand hygiene, and Betadine prep. The right femoral head was marked fluoroscopically. Under sterile conditions and local anesthesia, the right common femoral artery access was performed with a micropuncture needle. Under direct ultrasound guidance, the right common femoral was accessed with a micropuncture kit. An ultrasound image was saved for documentation purposes. This allowed for placement of a 5-French vascular sheath. A limited arteriogram was performed through the side arm of the sheath confirming appropriate access within the right common femoral artery. Over a Bentson wire, a Mickelson catheter was advanced the caudal aspect of the thoracic aorta where was reformed, back bled and flushed. The Mickelson catheter was then utilized to select the inferior mesenteric artery and a selective superior mesenteric arteriogram was performed. With  the use of a fathom 14 microwire, a regular Renegade microcatheter was advanced to the level of the left colic artery and a selective left colic arteriogram was performed. The microcatheter was then utilized to select the inferior division of the left colic artery and a sub selective arteriogram was performed. The microcatheter was then advanced to select the distal arcade supplying the ill-defined area of extravasation involving the mid descending colon. Sub selective injection confirmed appropriate positioning and the vessel was percutaneously coil embolized with two overlapping 2 mm diameter interlock coils. The microcatheter was retracted to the level the left colic artery and a post embolization left colic arteriogram was performed. The microcatheter was then advanced to select the distal arcade felt to potentially be supplying additional collateral supply to the ill-defined area of active extravasation involving the mid descending colon. Sub selective injection confirmed appropriate positioning and the vessel was percutaneously coil embolized with two overlapping 2 mm diameter interlock coils. The microcatheter was then retracted to the to the level pelvic artery and a selective left colic arteriogram was performed. Microcatheter was removed and a post embolization arteriogram was performed at the level of the IMA Images were reviewed and the procedure was terminated. All wires, catheters and sheaths were removed from the patient. Hemostasis was achieved at the right  groin access site with manual compression. Procedure was complicated by development of a small hematoma at the right groin access site. The patient otherwise tolerated the procedure well without immediate post procedural complication. FINDINGS: Selective inferior mesenteric arteriogram demonstrates conventional branching pattern with ill-defined area of extravasation involving the mid descending colon compatible with findings on preceding CTA.  Selective left colic arteriogram redemonstrates the ill-defined area of extravasation within the mid descending colon supplied predominantly via the inferior division of the left colic artery with potential collateral supply from the superior division. Sub selective arteriogram of the inferior division the left colic artery confirms supply to the ill-defined area of extravasation and as such the supplying distal arcade was percutaneously coil embolized. Sub selective arteriogram of the superior division of the left colic artery suggests additional collateral supply to the ill-defined area of extravasation and as such the supplying distal arcade was percutaneously coil embolized. Completion left colic and inferior mesenteric arteriograms are negative for residual areas of extravasation or collateral supply to the mid descending colon. Note, superior mesenteric arteriogram was not performed given exhaustive evaluation on dedicated mesenteric arteriogram performed 3 days prior (01/09/2021). IMPRESSION: Technically successful percutaneous coil embolization of distal arcades arising from both the superior and inferior divisions of the left colic artery supplying the ill-defined area of intraluminal contrast extravasation involving the mid descending colon compatible with the findings seen on preceding CTA. PLAN: - The patient is to remain flat for 4 hours with right leg straight (until at least 0730). - The patient will continue to experience several additional bloody bowel movements and may continue to require additional resuscitation (as the patient was bleeding both before and during the procedure), however ultimately I am hopeful she will stabilize in the coming days. Electronically Signed   By: Sandi Mariscal M.D.   On: 01/12/2021 07:24   IR Angiogram Selective Each Additional Vessel  Result Date: 01/12/2021 INDICATION: Recurrent lower GI bleeding. Positive CTA. Please perform mesenteric arteriogram with potential  embolization. EXAM: 1. ULTRASOUND GUIDANCE FOR ARTERIAL ACCESS 2. SELECTIVE INFERIOR MESENTERIC ARTERIOGRAM 3. SUB SELECTIVE LEFT COLIC ARTERIOGRAM 4. SUB SELECTIVE DISTAL ARCADE DIVISIONS OF THE LEFT COLIC ARTERY AND PERCUTANEOUS COIL EMBOLIZATION COMPARISON:  CTA abdomen and pelvis - 01/11/2021 Mesenteric arteriogram - 01/09/2021 Nuclear medicine tagged red blood cell bleeding scan - 01/08/2021 CT abdomen and pelvis - 01/08/2021 MEDICATIONS: None ANESTHESIA/SEDATION: Moderate (conscious) sedation was employed during this procedure. A total of Versed 0.5 mg was administered intravenously. Moderate Sedation Time: 60 minutes. The patient's level of consciousness and vital signs were monitored continuously by radiology nursing throughout the procedure under my direct supervision. CONTRAST:  45 cc Omnipaque 300 FLUOROSCOPY TIME:  19 minutes (732 mGy) COMPLICATIONS: SIR Level A - No therapy, no consequence. Procedure complicated by development of a small hematoma at the right groin access site. PROCEDURE: Informed consent was obtained from the patient following explanation of the procedure, risks, benefits and alternatives. All questions were addressed. A time out was performed prior to the initiation of the procedure. Maximal barrier sterile technique utilized including caps, mask, sterile gowns, sterile gloves, large sterile drape, hand hygiene, and Betadine prep. The right femoral head was marked fluoroscopically. Under sterile conditions and local anesthesia, the right common femoral artery access was performed with a micropuncture needle. Under direct ultrasound guidance, the right common femoral was accessed with a micropuncture kit. An ultrasound image was saved for documentation purposes. This allowed for placement of a 5-French vascular sheath. A limited arteriogram was performed through  the side arm of the sheath confirming appropriate access within the right common femoral artery. Over a Bentson wire, a  Mickelson catheter was advanced the caudal aspect of the thoracic aorta where was reformed, back bled and flushed. The Mickelson catheter was then utilized to select the inferior mesenteric artery and a selective superior mesenteric arteriogram was performed. With the use of a fathom 14 microwire, a regular Renegade microcatheter was advanced to the level of the left colic artery and a selective left colic arteriogram was performed. The microcatheter was then utilized to select the inferior division of the left colic artery and a sub selective arteriogram was performed. The microcatheter was then advanced to select the distal arcade supplying the ill-defined area of extravasation involving the mid descending colon. Sub selective injection confirmed appropriate positioning and the vessel was percutaneously coil embolized with two overlapping 2 mm diameter interlock coils. The microcatheter was retracted to the level the left colic artery and a post embolization left colic arteriogram was performed. The microcatheter was then advanced to select the distal arcade felt to potentially be supplying additional collateral supply to the ill-defined area of active extravasation involving the mid descending colon. Sub selective injection confirmed appropriate positioning and the vessel was percutaneously coil embolized with two overlapping 2 mm diameter interlock coils. The microcatheter was then retracted to the to the level pelvic artery and a selective left colic arteriogram was performed. Microcatheter was removed and a post embolization arteriogram was performed at the level of the IMA Images were reviewed and the procedure was terminated. All wires, catheters and sheaths were removed from the patient. Hemostasis was achieved at the right groin access site with manual compression. Procedure was complicated by development of a small hematoma at the right groin access site. The patient otherwise tolerated the procedure well  without immediate post procedural complication. FINDINGS: Selective inferior mesenteric arteriogram demonstrates conventional branching pattern with ill-defined area of extravasation involving the mid descending colon compatible with findings on preceding CTA. Selective left colic arteriogram redemonstrates the ill-defined area of extravasation within the mid descending colon supplied predominantly via the inferior division of the left colic artery with potential collateral supply from the superior division. Sub selective arteriogram of the inferior division the left colic artery confirms supply to the ill-defined area of extravasation and as such the supplying distal arcade was percutaneously coil embolized. Sub selective arteriogram of the superior division of the left colic artery suggests additional collateral supply to the ill-defined area of extravasation and as such the supplying distal arcade was percutaneously coil embolized. Completion left colic and inferior mesenteric arteriograms are negative for residual areas of extravasation or collateral supply to the mid descending colon. Note, superior mesenteric arteriogram was not performed given exhaustive evaluation on dedicated mesenteric arteriogram performed 3 days prior (01/09/2021). IMPRESSION: Technically successful percutaneous coil embolization of distal arcades arising from both the superior and inferior divisions of the left colic artery supplying the ill-defined area of intraluminal contrast extravasation involving the mid descending colon compatible with the findings seen on preceding CTA. PLAN: - The patient is to remain flat for 4 hours with right leg straight (until at least 0730). - The patient will continue to experience several additional bloody bowel movements and may continue to require additional resuscitation (as the patient was bleeding both before and during the procedure), however ultimately I am hopeful she will stabilize in the  coming days. Electronically Signed   By: Sandi Mariscal M.D.   On: 01/12/2021 07:24  IR Angiogram Selective Each Additional Vessel  Result Date: 01/12/2021 INDICATION: Recurrent lower GI bleeding. Positive CTA. Please perform mesenteric arteriogram with potential embolization. EXAM: 1. ULTRASOUND GUIDANCE FOR ARTERIAL ACCESS 2. SELECTIVE INFERIOR MESENTERIC ARTERIOGRAM 3. SUB SELECTIVE LEFT COLIC ARTERIOGRAM 4. SUB SELECTIVE DISTAL ARCADE DIVISIONS OF THE LEFT COLIC ARTERY AND PERCUTANEOUS COIL EMBOLIZATION COMPARISON:  CTA abdomen and pelvis - 01/11/2021 Mesenteric arteriogram - 01/09/2021 Nuclear medicine tagged red blood cell bleeding scan - 01/08/2021 CT abdomen and pelvis - 01/08/2021 MEDICATIONS: None ANESTHESIA/SEDATION: Moderate (conscious) sedation was employed during this procedure. A total of Versed 0.5 mg was administered intravenously. Moderate Sedation Time: 60 minutes. The patient's level of consciousness and vital signs were monitored continuously by radiology nursing throughout the procedure under my direct supervision. CONTRAST:  45 cc Omnipaque 300 FLUOROSCOPY TIME:  19 minutes (761 mGy) COMPLICATIONS: SIR Level A - No therapy, no consequence. Procedure complicated by development of a small hematoma at the right groin access site. PROCEDURE: Informed consent was obtained from the patient following explanation of the procedure, risks, benefits and alternatives. All questions were addressed. A time out was performed prior to the initiation of the procedure. Maximal barrier sterile technique utilized including caps, mask, sterile gowns, sterile gloves, large sterile drape, hand hygiene, and Betadine prep. The right femoral head was marked fluoroscopically. Under sterile conditions and local anesthesia, the right common femoral artery access was performed with a micropuncture needle. Under direct ultrasound guidance, the right common femoral was accessed with a micropuncture kit. An ultrasound image  was saved for documentation purposes. This allowed for placement of a 5-French vascular sheath. A limited arteriogram was performed through the side arm of the sheath confirming appropriate access within the right common femoral artery. Over a Bentson wire, a Mickelson catheter was advanced the caudal aspect of the thoracic aorta where was reformed, back bled and flushed. The Mickelson catheter was then utilized to select the inferior mesenteric artery and a selective superior mesenteric arteriogram was performed. With the use of a fathom 14 microwire, a regular Renegade microcatheter was advanced to the level of the left colic artery and a selective left colic arteriogram was performed. The microcatheter was then utilized to select the inferior division of the left colic artery and a sub selective arteriogram was performed. The microcatheter was then advanced to select the distal arcade supplying the ill-defined area of extravasation involving the mid descending colon. Sub selective injection confirmed appropriate positioning and the vessel was percutaneously coil embolized with two overlapping 2 mm diameter interlock coils. The microcatheter was retracted to the level the left colic artery and a post embolization left colic arteriogram was performed. The microcatheter was then advanced to select the distal arcade felt to potentially be supplying additional collateral supply to the ill-defined area of active extravasation involving the mid descending colon. Sub selective injection confirmed appropriate positioning and the vessel was percutaneously coil embolized with two overlapping 2 mm diameter interlock coils. The microcatheter was then retracted to the to the level pelvic artery and a selective left colic arteriogram was performed. Microcatheter was removed and a post embolization arteriogram was performed at the level of the IMA Images were reviewed and the procedure was terminated. All wires, catheters and  sheaths were removed from the patient. Hemostasis was achieved at the right groin access site with manual compression. Procedure was complicated by development of a small hematoma at the right groin access site. The patient otherwise tolerated the procedure well without immediate post procedural complication.  FINDINGS: Selective inferior mesenteric arteriogram demonstrates conventional branching pattern with ill-defined area of extravasation involving the mid descending colon compatible with findings on preceding CTA. Selective left colic arteriogram redemonstrates the ill-defined area of extravasation within the mid descending colon supplied predominantly via the inferior division of the left colic artery with potential collateral supply from the superior division. Sub selective arteriogram of the inferior division the left colic artery confirms supply to the ill-defined area of extravasation and as such the supplying distal arcade was percutaneously coil embolized. Sub selective arteriogram of the superior division of the left colic artery suggests additional collateral supply to the ill-defined area of extravasation and as such the supplying distal arcade was percutaneously coil embolized. Completion left colic and inferior mesenteric arteriograms are negative for residual areas of extravasation or collateral supply to the mid descending colon. Note, superior mesenteric arteriogram was not performed given exhaustive evaluation on dedicated mesenteric arteriogram performed 3 days prior (01/09/2021). IMPRESSION: Technically successful percutaneous coil embolization of distal arcades arising from both the superior and inferior divisions of the left colic artery supplying the ill-defined area of intraluminal contrast extravasation involving the mid descending colon compatible with the findings seen on preceding CTA. PLAN: - The patient is to remain flat for 4 hours with right leg straight (until at least 0730). - The  patient will continue to experience several additional bloody bowel movements and may continue to require additional resuscitation (as the patient was bleeding both before and during the procedure), however ultimately I am hopeful she will stabilize in the coming days. Electronically Signed   By: Sandi Mariscal M.D.   On: 01/12/2021 07:24   IR US Guide Vasc Access Right  Result Date: 01/12/2021 INDICATION: Recurrent lower GI bleeding. Positive CTA. Please perform mesenteric arteriogram with potential embolization. EXAM: 1. ULTRASOUND GUIDANCE FOR ARTERIAL ACCESS 2. SELECTIVE INFERIOR MESENTERIC ARTERIOGRAM 3. SUB SELECTIVE LEFT COLIC ARTERIOGRAM 4. SUB SELECTIVE DISTAL ARCADE DIVISIONS OF THE LEFT COLIC ARTERY AND PERCUTANEOUS COIL EMBOLIZATION COMPARISON:  CTA abdomen and pelvis - 01/11/2021 Mesenteric arteriogram - 01/09/2021 Nuclear medicine tagged red blood cell bleeding scan - 01/08/2021 CT abdomen and pelvis - 01/08/2021 MEDICATIONS: None ANESTHESIA/SEDATION: Moderate (conscious) sedation was employed during this procedure. A total of Versed 0.5 mg was administered intravenously. Moderate Sedation Time: 60 minutes. The patient's level of consciousness and vital signs were monitored continuously by radiology nursing throughout the procedure under my direct supervision. CONTRAST:  45 cc Omnipaque 300 FLUOROSCOPY TIME:  19 minutes (191 mGy) COMPLICATIONS: SIR Level A - No therapy, no consequence. Procedure complicated by development of a small hematoma at the right groin access site. PROCEDURE: Informed consent was obtained from the patient following explanation of the procedure, risks, benefits and alternatives. All questions were addressed. A time out was performed prior to the initiation of the procedure. Maximal barrier sterile technique utilized including caps, mask, sterile gowns, sterile gloves, large sterile drape, hand hygiene, and Betadine prep. The right femoral head was marked fluoroscopically. Under  sterile conditions and local anesthesia, the right common femoral artery access was performed with a micropuncture needle. Under direct ultrasound guidance, the right common femoral was accessed with a micropuncture kit. An ultrasound image was saved for documentation purposes. This allowed for placement of a 5-French vascular sheath. A limited arteriogram was performed through the side arm of the sheath confirming appropriate access within the right common femoral artery. Over a Bentson wire, a Mickelson catheter was advanced the caudal aspect of the thoracic aorta where was  reformed, back bled and flushed. The Mickelson catheter was then utilized to select the inferior mesenteric artery and a selective superior mesenteric arteriogram was performed. With the use of a fathom 14 microwire, a regular Renegade microcatheter was advanced to the level of the left colic artery and a selective left colic arteriogram was performed. The microcatheter was then utilized to select the inferior division of the left colic artery and a sub selective arteriogram was performed. The microcatheter was then advanced to select the distal arcade supplying the ill-defined area of extravasation involving the mid descending colon. Sub selective injection confirmed appropriate positioning and the vessel was percutaneously coil embolized with two overlapping 2 mm diameter interlock coils. The microcatheter was retracted to the level the left colic artery and a post embolization left colic arteriogram was performed. The microcatheter was then advanced to select the distal arcade felt to potentially be supplying additional collateral supply to the ill-defined area of active extravasation involving the mid descending colon. Sub selective injection confirmed appropriate positioning and the vessel was percutaneously coil embolized with two overlapping 2 mm diameter interlock coils. The microcatheter was then retracted to the to the level pelvic  artery and a selective left colic arteriogram was performed. Microcatheter was removed and a post embolization arteriogram was performed at the level of the IMA Images were reviewed and the procedure was terminated. All wires, catheters and sheaths were removed from the patient. Hemostasis was achieved at the right groin access site with manual compression. Procedure was complicated by development of a small hematoma at the right groin access site. The patient otherwise tolerated the procedure well without immediate post procedural complication. FINDINGS: Selective inferior mesenteric arteriogram demonstrates conventional branching pattern with ill-defined area of extravasation involving the mid descending colon compatible with findings on preceding CTA. Selective left colic arteriogram redemonstrates the ill-defined area of extravasation within the mid descending colon supplied predominantly via the inferior division of the left colic artery with potential collateral supply from the superior division. Sub selective arteriogram of the inferior division the left colic artery confirms supply to the ill-defined area of extravasation and as such the supplying distal arcade was percutaneously coil embolized. Sub selective arteriogram of the superior division of the left colic artery suggests additional collateral supply to the ill-defined area of extravasation and as such the supplying distal arcade was percutaneously coil embolized. Completion left colic and inferior mesenteric arteriograms are negative for residual areas of extravasation or collateral supply to the mid descending colon. Note, superior mesenteric arteriogram was not performed given exhaustive evaluation on dedicated mesenteric arteriogram performed 3 days prior (01/09/2021). IMPRESSION: Technically successful percutaneous coil embolization of distal arcades arising from both the superior and inferior divisions of the left colic artery supplying the  ill-defined area of intraluminal contrast extravasation involving the mid descending colon compatible with the findings seen on preceding CTA. PLAN: - The patient is to remain flat for 4 hours with right leg straight (until at least 0730). - The patient will continue to experience several additional bloody bowel movements and may continue to require additional resuscitation (as the patient was bleeding both before and during the procedure), however ultimately I am hopeful she will stabilize in the coming days. Electronically Signed   By: Sandi Mariscal M.D.   On: 01/12/2021 07:24   IR US Guide Vasc Access Right  Result Date: 01/09/2021 CLINICAL DATA:  GI bleed, localized to proximal descending colon on CTA and scintigraphy EXAM: SELECTIVE VISCERAL ARTERIOGRAPHY; IR ULTRASOUND GUIDANCE VASC  ACCESS RIGHT ANESTHESIA/SEDATION: Intravenous Fentanyl 50mg and Versed or 1.517mwere administered as conscious sedation during continuous monitoring of the patient's level of consciousness and physiological / cardiorespiratory status by the radiology RN, with a total moderate sedation time of 41 minutes. MEDICATIONS: Lidocaine 1% subcutaneous CONTRAST:  3065mMNIPAQUE IOHEXOL 300 MG/ML SOLN, 39m109mNIPAQUE IOHEXOL 300 MG/ML SOLN, 32mL70mIPAQUE IOHEXOL 300 MG/ML SOLN PROCEDURE: The procedure, risks (including but not limited to bleeding, infection, organ damage ), benefits, and alternatives were explained to the patient. Questions regarding the procedure were encouraged and answered. The patient understands and consents to the procedure. Right femoral region prepped and draped in usual sterile fashion. Maximal barrier sterile technique was utilized including caps, mask, sterile gowns, sterile gloves, sterile drape, hand hygiene and skin antiseptic. The right common femoral artery was localized under ultrasound. Under real-time ultrasound guidance, the vessel was accessed with a 21-gauge micropuncture needle, exchanged over a 018  guidewire for a transitional dilator, through which a 035 guidewire was advanced. Over this, a 5 [place]renc[place]ular sheath was placed, through which a 5 Fren[place]atheter was advanced and used to selectively catheterize the superior mesenteric artery for selective arteriography in multiple projections. A coaxial Renegade microcatheter with a fathom guidewire was advanced and used for selective arteriography of 2 third order branches in multiple projections. The catheter and sheath were removed and hemostasis achieved with the aid of the Exoseal device after confirmatory femoral arteriography. The patient tolerated the procedure well. COMPLICATIONS: None immediate FINDINGS: No evidence of active extravasation, early draining vein, AVM, or other lesion to suggest a site or etiology of the patient's GI bleeding. Venous phase confirms patency of the portal venous system. IMPRESSION: 1. Negative superior mesenteric arteriogram. No evidence of active extravasation or other focal lesion to suggest etiology of GI bleed. Electronically Signed   By: D  HaLucrezia Europe   On: 01/09/2021 09:46   DG CHEST PORT 1 VIEW  Result Date: 01/17/2021 CLINICAL DATA:  Recurrent bleed.  Shortness of breath. EXAM: PORTABLE CHEST 1 VIEW COMPARISON:  January 16, 2021 FINDINGS: Diffuse increased hazy and interstitial opacities with bilateral small effusions, unchanged. A left central line terminates in the central SVC. The cardiomediastinal silhouette is unchanged. No pneumothorax. IMPRESSION: The pulmonary opacities and small effusions are most consistent with pulmonary edema/volume overload with mild cardiomegaly. An atypical infection is not completely excluded. Recommend clinical correlation. Electronically Signed   By: DavidDorise BullionM.D   On: 01/17/2021 15:58   DG CHEST PORT 1 VIEW  Result Date: 01/16/2021 CLINICAL DATA:  Central catheter placement EXAM: PORTABLE CHEST 1 VIEW COMPARISON:  CT angio abdomen and pelvis 01/11/2021 FINDINGS:  Left subclavian approach central venous catheter tip terminates in the mid SVC. Telemetry leads and external support devices overlie the chest. Low lung volumes and atelectasis. Layering bilateral effusions. Some patchy and streaky opacities are present throughout both lungs, right greater than left. No visible pneumothorax. The aorta is calcified. The remaining cardiomediastinal contours are unremarkable. No acute osseous or soft tissue abnormality. IMPRESSION: Left subclavian approach central venous catheter tip terminates in the mid SVC. Developing bilateral effusions. Heterogeneous opacities in the lungs, could reflect developing infection and/or edema. Electronically Signed   By: PriceLovena Le   On: 01/16/2021 21:10   ECHOCARDIOGRAM COMPLETE  Result Date: 01/15/2021    ECHOCARDIOGRAM REPORT   Patient Name:   PEGGYJENAVEVE FENSTERMAKER of Exam: 01/15/2021 Medical Rec #:  [id number]:  61.5 in Accession #:    [id number]   Weight:       88.0 lb Date of Birth:  1933/05/12     BSA:          1.340 m Patient Age:    70 years     BP:           127/78 mmHg Patient Gender: F            HR:           83 bpm. Exam Location:  Inpatient Procedure: 2D Echo, Color Doppler, Cardiac Doppler and 3D Echo Indications:    Other Cardiac Sounds R01.2; Z01.818 Encounter for other                 preprocedural examination  History:        Patient has prior history of Echocardiogram examinations, most                 recent 08/07/2012. COPD; Risk Factors:Hypertension and                 Dyslipidemia.  Sonographer:    Jonelle Sidle Dance Referring Phys: [id number] ELIZABETH Lincoln  1. There is severe aortic stenosis with AVA 0.84cm2, mean gradient 54 mmHg, peak gradient 21mHg, Vmax 4.829m, DI 0.27.  2. The aortic valve is calcified. There is severe calcifcation of the aortic valve. There is severe thickening of the aortic valve. Aortic valve regurgitation is mild.  3. Left ventricular ejection fraction, by estimation, is  60 to 65%. The left ventricle has normal function. The left ventricle has no regional wall motion abnormalities.  4. There is severe hypertrophy of the basal septum measuring 1.8cm. The rest of the LV segments demonstrate moderate hypertrophy. An intracavitary gradient is present with no evidence of SAM.  5. Left ventricular diastolic parameters are consistent with Grade II diastolic dysfunction (pseudonormalization).  6. Elevated left atrial pressure.  7. Right ventricular systolic function is normal. The right ventricular size is normal.  8. Left atrial size was moderately dilated.  9. There is moderate thickening of the mitral valve leaflet(s). There is mild calcification of the mitral valve leaflet(s). Mild mitral annular calcification. Mild mitral valve regurgitation. 10. The inferior vena cava is dilated in size with >50% respiratory variability, suggesting right atrial pressure of 8 mmHg. Comparison(s): Compared to echo report in 2013, there is now severe aortic stenosis. FINDINGS  Left Ventricle: Left ventricular ejection fraction, by estimation, is 65 to 70%. The left ventricle has normal function. The left ventricle has no regional wall motion abnormalities. The left ventricular internal cavity size was normal in size. There is  servere hypertrophy of the basal septal segments. The rest of the LV segments demonstrate moderate hypertrophy. Left ventricular diastolic parameters are consistent with Grade II diastolic dysfunction (pseudonormalization). Elevated left atrial pressure. The E/e' is [id number] Ventricle: The right ventricular size is normal. No increase in right ventricular wall thickness. Right ventricular systolic function is normal. Left Atrium: Left atrial size was moderately dilated. Right Atrium: Right atrial size was normal in size. Pericardium: Trivial pericardial effusion is present. Mitral Valve: The mitral valve is abnormal. There is moderate thickening of the mitral valve leaflet(s).  There is mild calcification of the mitral valve leaflet(s). Mild mitral annular calcification. Mild mitral valve regurgitation. Tricuspid Valve: The tricuspid valve is normal in structure. Tricuspid valve regurgitation is trivial. Aortic Valve: There is severe aortic stenosis with AVA 0.84cm2, mean gradient 54 mmHg,  peak gradient 32mHg, Vmax 4.862m, DI 0.27. The aortic valve is calcified. There is severe calcifcation of the aortic valve. There is severe thickening of the aortic valve. Aortic valve regurgitation is mild. Aortic regurgitation PHT measures 373 msec. Pulmonic Valve: The pulmonic valve was normal in structure. Pulmonic valve regurgitation is trivial. Aorta: The aortic root and ascending aorta are structurally normal, with no evidence of dilitation. Venous: The inferior vena cava is dilated in size with greater than 50% respiratory variability, suggesting right atrial pressure of 8 mmHg. IAS/Shunts: No atrial level shunt detected by color flow Doppler.  LEFT VENTRICLE PLAX 2D LVIDd:         3.30 cm  Diastology LVIDs:         2.10 cm  LV e' medial:    5.66 cm/s LV PW:         1.30 cm  LV E/e' medial:  28.6 LV IVS:        1.20 cm  LV e' lateral:   4.35 cm/s LVOT diam:     2.00 cm  LV E/e' lateral: 37.2 LV SV:         83 LV SV Index:   62 LVOT Area:     3.14 cm  RIGHT VENTRICLE             IVC RV Basal diam:  2.30 cm     IVC diam: 2.30 cm RV S prime:     14.10 cm/s TAPSE (M-mode): 1.6 cm LEFT ATRIUM             Index       RIGHT ATRIUM          Index LA diam:        3.70 cm 2.76 cm/m  RA Area:     9.22 cm LA Vol (A2C):   36.5 ml 27.23 ml/m RA Volume:   16.60 ml 12.39 ml/m LA Vol (A4C):   56.0 ml 41.78 ml/m LA Biplane Vol: 46.2 ml 34.47 ml/m  AORTIC VALVE AV Area (Vmax):    0.84 cm AV Area (Vmean):   0.86 cm AV Area (VTI):     0.93 cm AV Vmax:           424.00 cm/s AV Vmean:          307.200 cm/s AV VTI:            0.885 m AV Peak Grad:      71.9 mmHg AV Mean Grad:      43.6 mmHg LVOT Vmax:          114.00 cm/s LVOT Vmean:        84.300 cm/s LVOT VTI:          0.263 m LVOT/AV VTI ratio: 0.30 AI PHT:            373 msec  AORTA Ao Root diam: 3.20 cm Ao Asc diam:  3.10 cm MITRAL VALVE MV Area (PHT): 2.54 cm     SHUNTS MV Decel Time: 299 msec     Systemic VTI:  0.26 m MV E velocity: 162.00 cm/s  Systemic Diam: 2.00 cm MV A velocity: 145.00 cm/s MV E/A ratio:  1.12 HeGwyndolyn KaufmanD Electronically signed by HeGwyndolyn KaufmanD Signature Date/Time: 01/15/2021/10:43:34 AM    Final    IR EMBO ARTERIAL NOT HEMORR HEMANG INC GUIDE ROADMAPPING  Result Date: 01/12/2021 INDICATION: Recurrent lower GI bleeding. Positive CTA. Please perform mesenteric arteriogram with potential embolization. EXAM: 1. ULTRASOUND GUIDANCE FOR  ARTERIAL ACCESS 2. SELECTIVE INFERIOR MESENTERIC ARTERIOGRAM 3. SUB SELECTIVE LEFT COLIC ARTERIOGRAM 4. SUB SELECTIVE DISTAL ARCADE DIVISIONS OF THE LEFT COLIC ARTERY AND PERCUTANEOUS COIL EMBOLIZATION COMPARISON:  CTA abdomen and pelvis - 01/11/2021 Mesenteric arteriogram - 01/09/2021 Nuclear medicine tagged red blood cell bleeding scan - 01/08/2021 CT abdomen and pelvis - 01/08/2021 MEDICATIONS: None ANESTHESIA/SEDATION: Moderate (conscious) sedation was employed during this procedure. A total of Versed 0.5 mg was administered intravenously. Moderate Sedation Time: 60 minutes. The patient's level of consciousness and vital signs were monitored continuously by radiology nursing throughout the procedure under my direct supervision. CONTRAST:  45 cc Omnipaque 300 FLUOROSCOPY TIME:  19 minutes (681 mGy) COMPLICATIONS: SIR Level A - No therapy, no consequence. Procedure complicated by development of a small hematoma at the right groin access site. PROCEDURE: Informed consent was obtained from the patient following explanation of the procedure, risks, benefits and alternatives. All questions were addressed. A time out was performed prior to the initiation of the procedure. Maximal barrier sterile  technique utilized including caps, mask, sterile gowns, sterile gloves, large sterile drape, hand hygiene, and Betadine prep. The right femoral head was marked fluoroscopically. Under sterile conditions and local anesthesia, the right common femoral artery access was performed with a micropuncture needle. Under direct ultrasound guidance, the right common femoral was accessed with a micropuncture kit. An ultrasound image was saved for documentation purposes. This allowed for placement of a 5-French vascular sheath. A limited arteriogram was performed through the side arm of the sheath confirming appropriate access within the right common femoral artery. Over a Bentson wire, a Mickelson catheter was advanced the caudal aspect of the thoracic aorta where was reformed, back bled and flushed. The Mickelson catheter was then utilized to select the inferior mesenteric artery and a selective superior mesenteric arteriogram was performed. With the use of a fathom 14 microwire, a regular Renegade microcatheter was advanced to the level of the left colic artery and a selective left colic arteriogram was performed. The microcatheter was then utilized to select the inferior division of the left colic artery and a sub selective arteriogram was performed. The microcatheter was then advanced to select the distal arcade supplying the ill-defined area of extravasation involving the mid descending colon. Sub selective injection confirmed appropriate positioning and the vessel was percutaneously coil embolized with two overlapping 2 mm diameter interlock coils. The microcatheter was retracted to the level the left colic artery and a post embolization left colic arteriogram was performed. The microcatheter was then advanced to select the distal arcade felt to potentially be supplying additional collateral supply to the ill-defined area of active extravasation involving the mid descending colon. Sub selective injection confirmed  appropriate positioning and the vessel was percutaneously coil embolized with two overlapping 2 mm diameter interlock coils. The microcatheter was then retracted to the to the level pelvic artery and a selective left colic arteriogram was performed. Microcatheter was removed and a post embolization arteriogram was performed at the level of the IMA Images were reviewed and the procedure was terminated. All wires, catheters and sheaths were removed from the patient. Hemostasis was achieved at the right groin access site with manual compression. Procedure was complicated by development of a small hematoma at the right groin access site. The patient otherwise tolerated the procedure well without immediate post procedural complication. FINDINGS: Selective inferior mesenteric arteriogram demonstrates conventional branching pattern with ill-defined area of extravasation involving the mid descending colon compatible with findings on preceding CTA. Selective left colic arteriogram  redemonstrates the ill-defined area of extravasation within the mid descending colon supplied predominantly via the inferior division of the left colic artery with potential collateral supply from the superior division. Sub selective arteriogram of the inferior division the left colic artery confirms supply to the ill-defined area of extravasation and as such the supplying distal arcade was percutaneously coil embolized. Sub selective arteriogram of the superior division of the left colic artery suggests additional collateral supply to the ill-defined area of extravasation and as such the supplying distal arcade was percutaneously coil embolized. Completion left colic and inferior mesenteric arteriograms are negative for residual areas of extravasation or collateral supply to the mid descending colon. Note, superior mesenteric arteriogram was not performed given exhaustive evaluation on dedicated mesenteric arteriogram performed 3 days prior  (01/09/2021). IMPRESSION: Technically successful percutaneous coil embolization of distal arcades arising from both the superior and inferior divisions of the left colic artery supplying the ill-defined area of intraluminal contrast extravasation involving the mid descending colon compatible with the findings seen on preceding CTA. PLAN: - The patient is to remain flat for 4 hours with right leg straight (until at least 0730). - The patient will continue to experience several additional bloody bowel movements and may continue to require additional resuscitation (as the patient was bleeding both before and during the procedure), however ultimately I am hopeful she will stabilize in the coming days. Electronically Signed   By: Sandi Mariscal M.D.   On: 01/12/2021 07:24   CT Angio Abd/Pel W and/or Wo Contrast  Result Date: 01/11/2021 CLINICAL DATA:  Gastrointestinal bleed.  bright red blood. EXAM: CTA ABDOMEN AND PELVIS WITHOUT AND WITH CONTRAST TECHNIQUE: Multidetector CT imaging of the abdomen and pelvis was performed using the standard protocol during bolus administration of intravenous contrast. Multiplanar reconstructed images and MIPs were obtained and reviewed to evaluate the vascular anatomy. CONTRAST:  136m OMNIPAQUE IOHEXOL 350 MG/ML SOLN COMPARISON:  Nuclear medicine gastrointestinal bleeding scan 01/08/2021, CT abdomen pelvis 01/08/2021 FINDINGS: VASCULAR Aorta: Severe atherosclerotic plaque. Normal caliber aorta without aneurysm, dissection, vasculitis or significant stenosis. Celiac: Patent without evidence of aneurysm, dissection, vasculitis or significant stenosis. SMA: Patent without evidence of aneurysm, dissection, vasculitis or significant stenosis. Renals: Mild atherosclerotic plaque of the origins. Both renal arteries are patent without evidence of aneurysm, dissection, vasculitis, fibromuscular dysplasia or significant stenosis. IMA: Patent without evidence of aneurysm, dissection, vasculitis  or significant stenosis. Inflow: Severe atherosclerotic plaque. Patent without evidence of aneurysm, dissection, vasculitis or significant stenosis. Proximal Outflow: At least moderate atherosclerotic plaque. Bilateral common femoral and visualized portions of the superficial and profunda femoral arteries are patent without evidence of aneurysm, dissection, vasculitis or significant stenosis. Veins: The hepatic, portal, splenic, superior mesenteric veins are patent. NON-VASCULAR Lower chest: Interval development of a 1.6 x 1.3 cm right lower lobe pulmonary nodular like consolidation likely represents atelectasis (not visualized on CT 2 days ago. Subsegmental atelectasis within bilateral lower lobes. Trace left pleural effusion. Hepatobiliary: No focal liver abnormality. No gallstones, gallbladder wall thickening, or pericholecystic fluid. No biliary dilatation. Pancreas: No focal lesion. Normal pancreatic contour. No surrounding inflammatory changes. No main pancreatic ductal dilatation. Spleen: Normal in size without focal abnormality. Adrenals/Urinary Tract: No adrenal nodule bilaterally. Bilateral kidneys enhance symmetrically. Subcentimeter hypodensities within the kidneys are too small to characterize. No hydronephrosis. No hydroureter. The urinary bladder is unremarkable. Stomach/Bowel: Left distal descending colon/sigmoid colon reanastomosis. Layering hyperdensity within the mid to distal descending colon is noted to slightly increase in density on the portal venous  phase but is also not visualized on noncontrast study. Associated slightly hyperdense material within the colonic lumen distally. The colon is noted to be fluid-filled past the splenic flexure. No bowel wall thickening or dilatation. The appendix not definitely identified. Lymphatic: No lymphadenopathy. Reproductive: Status post hysterectomy. No adnexal masses. Other: No intraperitoneal free fluid. No intraperitoneal free gas. No organized fluid  collection. Musculoskeletal: No abdominal wall hernia or abnormality. No suspicious lytic or blastic osseous lesions. No acute displaced fracture. Multilevel degenerative changes of the spine. 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 IMPRESSION: VASCULAR Aortic Atherosclerosis (ICD10-I70.0). NON-VASCULAR Mid to distal descending colon gastrointestinal hemorrhage that is located proximal to the colonic anastomosis. Electronically Signed   By: Iven Finn M.D.   On: 01/11/2021 22:36    Lab Data:  CBC: Recent Labs  Lab 01/18/21 0526 01/19/21 0403 01/20/21 0706 01/21/21 0553 01/22/21 0300  WBC 16.9* 13.3* 13.3* 15.9* 14.9*  HGB 9.1* 8.7* 9.8* 9.7* 8.9*  HCT 26.9* 25.9* 30.5* 30.6* 27.6*  MCV 87.9 87.5 90.8 90.8 90.5  PLT 290 369 504* 686* 267*   Basic Metabolic Panel: Recent Labs  Lab 01/17/21 0457 01/18/21 0526 01/19/21 0403 01/19/21 1346 01/20/21 0706 01/21/21 0553 01/22/21 0300  NA 132* 133* 131*  --  133* 135 136  K 3.4* 4.6 2.9* 4.0 4.6 4.2 3.8  CL 104 102 94*  --  95* 94* 94*  CO2 22 25 31   --  32 30 36*  GLUCOSE 163* 102* 145*  --  109* 91 107*  BUN 12 13 7*  --  10 16 26*  CREATININE 0.58 0.54 0.61  --  0.66 0.80 0.82  CALCIUM 6.7* 6.9* 6.9*  --  7.7* 7.8* 7.8*  MG 1.6* 1.9 1.5*  --  2.2  --   --    GFR: Estimated Creatinine Clearance: 38.8 mL/min (by C-G formula based on SCr of 0.82 mg/dL). Liver Function Tests: Recent Labs  Lab 01/17/21 0457 01/21/21 0553  AST 24 15  ALT 14 15  ALKPHOS 33* 46  BILITOT 1.7* 1.0  PROT 3.8* 4.5*  ALBUMIN 1.7* 1.9*   No results for input(s): LIPASE, AMYLASE in the last 168 hours. No results for input(s): AMMONIA in the last 168 hours. Coagulation Profile: Recent Labs  Lab 01/16/21 2259  INR 1.4*   Cardiac Enzymes: No results for input(s): CKTOTAL, CKMB, CKMBINDEX, TROPONINI in the last 168 hours. BNP (last 3 results) No results for input(s): PROBNP in the last  8760 hours. HbA1C: No results for input(s): HGBA1C in the last 72 hours. CBG: Recent Labs  Lab 01/22/21 1129 01/22/21 1608 01/22/21 2113 01/23/21 0755 01/23/21 1229  GLUCAP 139* 155* 145* 93 140*   Lipid Profile: No results for input(s): CHOL, HDL, LDLCALC, TRIG, CHOLHDL, LDLDIRECT in the last 72 hours. Thyroid Function Tests: No results for input(s): TSH, T4TOTAL, FREET4, T3FREE, THYROIDAB in the last 72 hours. Anemia Panel: No results for input(s): VITAMINB12, FOLATE, FERRITIN, TIBC, IRON, RETICCTPCT in the last 72 hours. Urine analysis:    Component Value Date/Time   COLORURINE YELLOW 03/09/2013 2028   APPEARANCEUR CLEAR 03/09/2013 2028   LABSPEC 1.036 (H) 03/09/2013 2028   PHURINE 6.5 03/09/2013 2028   GLUCOSEU 500 (A) 03/09/2013 2028   HGBUR NEGATIVE 03/09/2013 2028   BILIRUBINUR NEGATIVE 03/09/2013 2028   KETONESUR 15 (A) 03/09/2013 2028   PROTEINUR NEGATIVE 03/09/2013 2028   UROBILINOGEN 1.0 03/09/2013 2028   NITRITE NEGATIVE 03/09/2013 2028  LEUKOCYTESUR NEGATIVE 03/09/2013 2028     Estill Cotta M.D. [facility] 01/23/2021, 1:44 PM  Available via Epic secure chat 7am-7pm After 7 pm, please refer to night coverage provider listed on amion.

## 2021-01-23 NOTE — Consult Note (Signed)
[place] Nurse ostomy follow up Stoma type/location: RMQ ileostomy, well budded Stomal assessment/size: 1 3/8" pink and moist  Producing liquid yellow stool Peristomal assessment: intact Treatment options for stomal/peristomal skin: barrier ring Output liquid green stool Ostomy pouching: 1pc.convex pouch with barrier ring Education provided: daughter performs pouch change today.  She removes old pouch, cleans skin, applied barrier ring.  She cut pouch to fit and applied with minimal coaching. Rolled closed.  She feels more comfortable today after completing.   Enrolled patient in [facility] Start Discharge program: Yes Will see once more. Being transferred to medical floor today.   Domenic Moras MSN, RN, FNP-BC CWON Wound, Ostomy, Continence Nurse Pager [phone]

## 2021-01-23 NOTE — Progress Notes (Signed)
Pt transferred from ICU to  [facility] around 2:30pm to room 1527. Alert and Ox 4 Vital WDL. She was oriented to our unit.

## 2021-01-23 NOTE — Progress Notes (Signed)
ANTICOAGULATION CONSULT NOTE - Follow Up Consult  Pharmacy Consult for enoxaparin Indication: VTE prophylaxis  No Known Allergies  Patient Measurements: Height: 5' 1.5" (156.2 cm) Weight: 53.8 kg (118 lb 9.7 oz) IBW/kg (Calculated) : 48.95  Vital Signs: Temp: 98.1 F (36.7 C) (03/11 0800) Temp Source: Oral (03/11 0800) BP: 105/58 (03/11 0100) Pulse Rate: 79 (03/11 0100)  Labs: Recent Labs    01/21/21 0553 01/22/21 0300  HGB 9.7* 8.9*  HCT 30.6* 27.6*  PLT 686* 639*  CREATININE 0.80 0.82    Estimated Creatinine Clearance: 38.8 mL/min (by C-G formula based on SCr of 0.82 mg/dL).   Medical History: Past Medical History:  Diagnosis Date  . Anemia    pt denies, yet takes iron  . Chronic airway obstruction, not elsewhere classified   . COPD (chronic obstructive pulmonary disease) ([place])   . Diverticulosis   . GI hemorrhage from post-treatment cecal ulcer 08/18/2012  . Hemorrhoids   . HTN (hypertension)   . Hypercholesteremia   . Lower GI bleed multiple   AVM's    Assessment: 87 y/o F admitted with lower GIB. Pt is s/p colectomy (01/15/21) and ileostomy (01/17/21) this admission. PMH significant for recurrent GIB and anemia, required several transfusions this admission. Pharmacy consulted to dose enoxaparin for VTE ppx.   Today, 01/23/21  CBC (3/10): Hgb (8.9) - low but stable' Plt now elevated  SCr 0.82, CrCl ~38 mL/min  TBW 54 kg   Enoxaparin dosed conservatively at 30 mg due to advanced age, recent bleeding/surgery, and low total body weight.   Plan:   Continue enoxaparin 30 mg subQ daily for DVT ppx  Hgb and SCr has been stable while on LMWH for several days. Pharmacy to sign off, please re-consult if needed.   Lenis Noon, PharmD 01/23/2021,10:12 AM

## 2021-01-23 NOTE — Progress Notes (Signed)
6 Days Post-Op    CC:  GI bleed  Subjective: Tired today.  Had a bug in her room overnight and was interrupted a lot.  Up in chair for a long time yesterday.  Tolerating soft diet, but decrease intake for lack of flavor and poor appetite  Objective: Vital signs in last 24 hours: Temp:  [98.1 F (36.7 C)-98.5 F (36.9 C)] 98.2 F (36.8 C) (03/11 0350) Pulse Rate:  [66-86] 79 (03/11 0100) Resp:  [11-16] 12 (03/11 0100) BP: (92-108)/(45-64) 105/58 (03/11 0100) SpO2:  [90 %-95 %] 90 % (03/11 0100) Weight:  [53.8 kg] 53.8 kg (03/11 0444) Last BM Date: 01/22/21 200 Po recorded Stool 725 - ileostomy Afebrile, VSS WBC 15.9>>14.9 H/H 9.7/30.6>>8.9/27.6  Intake/Output from previous day: 03/10 0701 - 03/11 0700 In: -  Out: 1350 [Stool:1350] Intake/Output this shift: No intake/output data recorded.  PE: Heart: irregular Lungs: CTAB Abd: soft, some erythema surrounding her wound c/w yeast, midline wound otherwise clean, +BS, ileostomy with some soft formed stool present at os and liquid in the bag.  Stoma is pink and viable  Lab Results:  Recent Labs    01/21/21 0553 01/22/21 0300  WBC 15.9* 14.9*  HGB 9.7* 8.9*  HCT 30.6* 27.6*  PLT 686* 639*    BMET Recent Labs    01/21/21 0553 01/22/21 0300  NA 135 136  K 4.2 3.8  CL 94* 94*  CO2 30 36*  GLUCOSE 91 107*  BUN 16 26*  CREATININE 0.80 0.82  CALCIUM 7.8* 7.8*   PT/INR No results for input(s): LABPROT, INR in the last 72 hours.  Recent Labs  Lab 01/17/21 0457 01/21/21 0553  AST 24 15  ALT 14 15  ALKPHOS 33* 46  BILITOT 1.7* 1.0  PROT 3.8* 4.5*  ALBUMIN 1.7* 1.9*     Lipase  No results found for: LIPASE   Medications: . acetaminophen  1,000 mg Oral Q6H  . budesonide  6 mg Oral Daily  . calcium carbonate  1,250 mg Oral Daily  . Chlorhexidine Gluconate Cloth  6 each Topical Daily  . enoxaparin (LOVENOX) injection  30 mg Subcutaneous Daily  . feeding supplement  237 mL Oral BID BM  . ferrous  sulfate  325 mg Oral BID WC  . insulin aspart  0-15 Units Subcutaneous TID WC  . loperamide  2 mg Oral BID  . metoprolol tartrate  25 mg Oral BID  . multivitamin with minerals  1 tablet Oral Daily  . nystatin ointment   Topical BID  . pantoprazole (PROTONIX) IV  40 mg Intravenous Q24H  . pravastatin  10 mg Oral Daily  . psyllium  1 packet Oral BID  . sodium chloride flush  10-40 mL Intracatheter Q12H    Assessment/Plan Severe aortic stenosis AVA 0.84 cm/EF 60 to 60%/AVWUJ 2 diastolic dysfunction(ECHO 01/15/2021) COPD-ongoing tobacco use/59-pack-year history Acute respiratory failure with hypoxia/fluid overload Hypertension/hypotension Hx collagenous colitis-budesonidepreadmit Anemia - transfusedx3, now stable Hyperlipidemia Moderate malnutrition/deconditioning  - prealbumin 10.7 Hyperglycemia  -Hemoglobin A1c 7.4   Ischemic colitis; ischemic mass at the splenic flexure/dense intra-abdominal adhesions S/P colonoscopy, cold snare polypectomy and AVM ablation 01/10/2021; S/P angio embolization for GI bleed 01/12/2021 Exploratory laparotomy with partial colectomy and colostomy 01/15/2021 Dr. Erroll Luna. POD #8 Exploratory laparotomy-completion colectomy/ileostomy 01/17/2021 Dr. Ralene Ok, POD #6  -tx to floor today -cont PT/OT who recommend [facility] currently.  Patient said daughter is available to live with her and help her initially -DC purewick and get bedside commode for her  to mobilize more -nystatin ointment to go around her wound for some candida infection noted -cont midline dressing changes -cont soft diet -ileostomy output around 1300 yesterday.  On iron, fiber, will add imodium 2mg  BID today.  FEN: soft dietIV fluids on hold ID: Cefotetan preop; Cipro/Flagyl 3/2-3/7 DVT: SCDs/Lovenox        LOS: 12 days    Kathleen Pope 01/23/2021 Please see Amion

## 2021-01-23 NOTE — Progress Notes (Signed)
Physical Therapy Treatment Patient Details Name: Kathleen Pope MRN: [id number] DOB: [birth year] Today's Date: 01/23/2021    History of Present Illness Kathleen Pope is a 87 y.o. 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 Pt now s/p partial colectomy on 3/3 and Exp Lap with completion of colectomy and end ileostomy for con't GIB on 01/17/21.    PT Comments    The patient is ready to ambulate. Used RW for stability. Patient's VSS. Continue PT, she plans to return home, her daughter is very close by.  Follow Up Recommendations  Home health PT     Equipment Recommendations  3in1 (PT)    Recommendations for Other Services       Precautions / Restrictions Precautions Precautions: Fall Precaution Comments: monitor vitals, recent abd sx, colostomy Restrictions Weight Bearing Restrictions: No    Mobility  Bed Mobility   Bed Mobility: Rolling;Sidelying to Sit Rolling: Min guard Sidelying to sit: Min assist;HOB elevated       General bed mobility comments: cues for sequence of log roll technique.light assist to bring trunk upright from sidelying.    Transfers   Equipment used: Rolling walker (2 wheeled) Transfers: Sit to/from Stand Sit to Stand: [employer] transfer comment: cues for hand placement with RW, no assist, guarding/ for safety.  Ambulation/Gait Ambulation/Gait assistance: Min assist Gait Distance (Feet): 200 Feet Assistive device: Rolling walker (2 wheeled) Gait Pattern/deviations: Decreased stride length;Step-to pattern     General Gait Details: slow speed, gait steady with RW.,   Stairs             Wheelchair Mobility    Modified Rankin (Stroke Patients Only)       Balance Overall balance assessment: Needs assistance Sitting-balance support: Feet  supported;No upper extremity supported Sitting balance-Leahy Scale: Good     Standing balance support: No upper extremity supported Standing balance-Leahy Scale: Fair Standing balance comment: static stand without UE assist                            Cognition Arousal/Alertness: Awake/alert                                            Exercises      General Comments        Pertinent Vitals/Pain Faces Pain Scale: Hurts a little bit Pain Location: abdomen Pain Descriptors / Indicators: Sore;Discomfort Pain Intervention(s): Monitored during session    Home Living                      Prior Function            PT Goals (current goals can now be found in the care plan section) Progress towards PT goals: Progressing toward goals    Frequency    Min 3X/week      PT Plan Current plan remains appropriate    Co-evaluation              AM-PAC PT "6 Clicks" Mobility   Outcome Measure  Help needed turning from your back to your side while in a flat bed without using bedrails?: A Little Help  needed moving from lying on your back to sitting on the side of a flat bed without using bedrails?: A Little Help needed moving to and from a bed to a chair (including a wheelchair)?: A Little Help needed standing up from a chair using your arms (e.g., wheelchair or bedside chair)?: A Little Help needed to walk in hospital room?: A Little Help needed climbing 3-5 steps with a railing? : A Lot 6 Click Score: 17    End of Session   Activity Tolerance: Patient tolerated treatment well Patient left: in chair;with call bell/phone within reach;with chair alarm set Nurse Communication: Mobility status PT Visit Diagnosis: Difficulty in walking, not elsewhere classified (R26.2);Unsteadiness on feet (R26.81)     Time: 7517-0017 PT Time Calculation (min) (ACUTE ONLY): 32 min  Charges:  $Gait Training: 23-37 mins                     [place] Pager [phone] Office [phone]    Claretha Cooper 01/23/2021, 12:36 PM

## 2021-01-24 DIAGNOSIS — R198 Other specified symptoms and signs involving the digestive system and abdomen: Secondary | ICD-10-CM

## 2021-01-24 DIAGNOSIS — Z932 Ileostomy status: Secondary | ICD-10-CM

## 2021-01-24 LAB — GLUCOSE, CAPILLARY
Glucose-Capillary: 90 mg/dL (ref 70–99)
Glucose-Capillary: 153 mg/dL — ABNORMAL HIGH (ref 70–99)
Glucose-Capillary: 140 mg/dL — ABNORMAL HIGH (ref 70–99)
Glucose-Capillary: 163 mg/dL — ABNORMAL HIGH (ref 70–99)

## 2021-01-24 LAB — CBC
HCT: 27.4 % — ABNORMAL LOW (ref 36.0–46.0)
Hemoglobin: 8.7 g/dL — ABNORMAL LOW (ref 12.0–15.0)
MCH: 29.5 pg (ref 26.0–34.0)
MCHC: 31.8 g/dL (ref 30.0–36.0)
MCV: 92.9 fL (ref 80.0–100.0)
Platelets: 761 10*3/uL — ABNORMAL HIGH (ref 150–400)
RBC: 2.95 MIL/uL — ABNORMAL LOW (ref 3.87–5.11)
RDW: 16.3 % — ABNORMAL HIGH (ref 11.5–15.5)
WBC: 13.6 10*3/uL — ABNORMAL HIGH (ref 4.0–10.5)
nRBC: 0 % (ref 0.0–0.2)

## 2021-01-24 MED ORDER — LOPERAMIDE HCL 2 MG PO CAPS: 2.0000 mg | ORAL_CAPSULE | Freq: Four times a day (QID) | ORAL | Status: DC | PRN

## 2021-01-24 MED ORDER — PANTOPRAZOLE SODIUM 40 MG PO TBEC
40.0000 mg | DELAYED_RELEASE_TABLET | Freq: Two times a day (BID) | ORAL | Status: DC
  Administered 2021-01-24 – 2021-01-29 (×11): 40 mg via ORAL
  Filled 2021-01-24 (×11): qty 1

## 2021-01-24 MED ORDER — SODIUM CHLORIDE 0.9% FLUSH
3.0000 mL | Freq: Two times a day (BID) | INTRAVENOUS | Status: DC
  Administered 2021-01-24 – 2021-01-28 (×6): 3 mL via INTRAVENOUS

## 2021-01-24 MED ORDER — METHOCARBAMOL 1000 MG/10ML IJ SOLN
1000.0000 mg | Freq: Four times a day (QID) | INTRAVENOUS | Status: DC | PRN
  Filled 2021-01-24: qty 10

## 2021-01-24 MED ORDER — SIMETHICONE 40 MG/0.6ML PO SUSP
40.0000 mg | Freq: Four times a day (QID) | ORAL | Status: DC | PRN
  Filled 2021-01-24: qty 0.6

## 2021-01-24 MED ORDER — ALBUMIN HUMAN 5 % IV SOLN
12.5000 g | Freq: Four times a day (QID) | INTRAVENOUS | Status: AC | PRN
  Filled 2021-01-24: qty 250

## 2021-01-24 MED ORDER — PROCHLORPERAZINE EDISYLATE 10 MG/2ML IJ SOLN: 5.0000 mg | INTRAMUSCULAR | Status: DC | PRN

## 2021-01-24 MED ORDER — DIPHENHYDRAMINE HCL 50 MG/ML IJ SOLN
12.5000 mg | Freq: Four times a day (QID) | INTRAMUSCULAR | Status: DC | PRN
  Administered 2021-01-28: 25 mg via INTRAVENOUS
  Filled 2021-01-24: qty 1

## 2021-01-24 MED ORDER — LIP MEDEX EX OINT
1.0000 "application " | TOPICAL_OINTMENT | Freq: Two times a day (BID) | CUTANEOUS | Status: DC
  Administered 2021-01-24 – 2021-01-28 (×10): 1 via TOPICAL
  Filled 2021-01-24 (×3): qty 7

## 2021-01-24 MED ORDER — ALUM & MAG HYDROXIDE-SIMETH 200-200-20 MG/5ML PO SUSP: 30.0000 mL | Freq: Four times a day (QID) | ORAL | Status: DC | PRN

## 2021-01-24 MED ORDER — SODIUM CHLORIDE 0.9 % IV SOLN: 250.0000 mL | INTRAVENOUS | Status: DC | PRN

## 2021-01-24 MED ORDER — MAGIC MOUTHWASH
15.0000 mL | Freq: Four times a day (QID) | ORAL | Status: DC | PRN
  Filled 2021-01-24: qty 15

## 2021-01-24 MED ORDER — CALCIUM POLYCARBOPHIL 625 MG PO TABS
625.0000 mg | ORAL_TABLET | Freq: Two times a day (BID) | ORAL | Status: DC
  Administered 2021-01-24 – 2021-01-29 (×11): 625 mg via ORAL
  Filled 2021-01-24 (×12): qty 1

## 2021-01-24 MED ORDER — SODIUM CHLORIDE 0.9% FLUSH: 3.0000 mL | INTRAVENOUS | Status: DC | PRN

## 2021-01-24 NOTE — Progress Notes (Signed)
[facility] consult progress note                                                                               Patient Demographics  Kathleen Pope, is a 87 y.o. female, DOB - Feb 12, 1933, [id number]  Admit date - 01/11/2021   Admitting Physician Elwyn Reach, MD  Outpatient Primary MD for the patient is Redmond School, MD  Outpatient specialists:   LOS - 13  days   Medical records reviewed and are as summarized below:    Chief Complaint  Patient presents with  . GI Bleeding       Brief summary   Kathleen Pope is a 87 y.o. 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 Patient presented secondary recurrent GI bleeding. Patient underwent coil embolization which unfortunately appears to have left to ischemic colon requiring partial colectomy. After partial colectomy, patient developed hypotension and brisk GI bleeding requiring total colectomy.    Assessment & Plan   Primary problem Lower GI bleeding with likely diverticular bleed, acute blood loss anemia -Presented with, recently admitted with similar episode. -Status post mesenteric arteriogram with successful coil embolization of left colic artery on 4/44 -Recurrent large-volume episode of hematochezia on 3/1 overnight.  GI was consulted again and performed a flexible sigmoidoscopy on 3/2 which showed colonic ischemia -After partial colectomy, had recurrent GI bleeding.  Colonoscopy at bedside unsuccessful in identifying the lesion secondary to previous blood and was recommended colectomy, performed on 3/5 -Received 7 units packed RBCs for GI bleeding, H&H stable   Active problems Colonic ischemia -Seen on endoscopy.  General surgery consulted -Status post ex lap with partial colectomy and end colostomy on 3/3. -NG tube removed, underwent total colectomy with end ileostomy on 3/5 -Tolerating diet but does not like the  food -Management per surgery   Hypovolemic shock, hemorrhagic shock, hypotension -Secondary to blood loss from GI bleeding, requiring aggressive fluid, blood resuscitation in addition to short-term vasopressor support -Lasix discontinued, BP now stable.  Acute respiratory failure with hypoxia secondary to fluid overload -Likely due to overload from hyper obesity transfusions, IV fluid.  Net + 10 L with maximum 15 kg weight gain -Patient was placed on a Lasix 40 mg IV twice daily, has severe aortic stenosis, cautious diuresis.  Lasix has been discontinued -Currently on room air, sats 100% on room air    Severe aortic stenosis Seen on Transthoracic Echocardiogram. Will need outpatient cardiology follow-up, follows Dr. Johney Frame, last seen on 12/17/2020.  Hyperglycemia -Hemoglobin A1c 7.4, continue SSI  -  will benefit from Metformin on discharge  COPD -Currently no wheezing,  -Has history of nicotine use    Essential hypertension -BP remains soft, continue to hold Lasix and losartan   Collagenous colitis -Takes budesonide as an outpatient, currently on prescribed dose of 6 mg daily  Leukocytosis -No fevers, currently improving   Hyperlipidemia -Continue statin  Thrombocytosis -Likely reactive, improving     Protein-calorie malnutrition, severe -Albumin 1.9, recurrent GI bleeding chronic illness.  BMI 21 -Continue nutritional supplements    Code Status: DNR DVT Prophylaxis:  enoxaparin (LOVENOX) injection 30 mg Start: 01/20/21 1100 Place and maintain sequential compression device Start: 01/15/21 1610 SCDs Start: 01/12/21 0237   Level of Care: Level of care: Telemetry Family Communication: Discussed all imaging results, lab results, explained to the patient    Disposition Plan:     Status is: Inpatient per surgery  Remains inpatient appropriate because:Inpatient level of care appropriate due to severity of illness   Time Spent in minutes 35  minutes  Procedures:   POST MESENTERIC ARTERIOGRAM AND PERCUTANEOUS EMBOLIZATION (01/12/2021)  EGD/FLEXIBLE SIGMOIDOSCOPY (01/15/2021)  PARTIAL COLECTOMY (01/15/2021)   Antimicrobials:   Anti-infectives (From admission, onward)   Start     Dose/Rate Route Frequency Ordered Stop   01/15/21 1800  ciprofloxacin (CIPRO) IVPB 400 mg  Status:  Discontinued        400 mg 200 mL/hr over 60 Minutes Intravenous Every 12 hours 01/15/21 1609 01/19/21 0817   01/15/21 1700  metroNIDAZOLE (FLAGYL) IVPB 500 mg  Status:  Discontinued        500 mg 100 mL/hr over 60 Minutes Intravenous Every 8 hours 01/15/21 1609 01/19/21 0817   01/15/21 0800  cefoTEtan (CEFOTAN) 2 g in sodium chloride 0.9 % 100 mL IVPB        2 g 200 mL/hr over 30 Minutes Intravenous On call to O.R. 01/15/21 0705 01/15/21 1145         Medications  Scheduled Meds: . acetaminophen  1,000 mg Oral Q6H  . budesonide  6 mg Oral Daily  . calcium carbonate  1,250 mg Oral Daily  . Chlorhexidine Gluconate Cloth  6 each Topical Daily  . enoxaparin (LOVENOX) injection  30 mg Subcutaneous Daily  . feeding supplement  237 mL Oral BID BM  . ferrous sulfate  325 mg Oral BID WC  . insulin aspart  0-15 Units Subcutaneous TID WC  . loperamide  2 mg Oral BID  . metoprolol tartrate  25 mg Oral BID  . multivitamin with minerals  1 tablet Oral Daily  . nystatin ointment   Topical BID  . pantoprazole  40 mg Oral BID  . polycarbophil  625 mg Oral BID  . pravastatin  10 mg Oral Daily  . sodium chloride flush  10-40 mL Intracatheter Q12H   Continuous Infusions: . sodium chloride Stopped (01/16/21 0641)   PRN Meds:.sodium chloride, albuterol, lip balm, loperamide, methocarbamol, morphine injection, ondansetron **OR** ondansetron (ZOFRAN) IV, oxyCODONE, sodium chloride flush      Subjective:   Kathleen Pope was seen and examined today.  Pleasant and in much better spirits today.  Does not like the food.  Off O2 on room air still fairly  deconditioned.  No acute chest pain or shortness of breath, no nausea or vomiting.    Objective:   Vitals:   01/23/21 1200 01/23/21 1453 01/23/21 2140 01/24/21 0248  BP:  112/72 122/64 (!) 92/44  Pulse:  100 91   Resp:  14 18   Temp: 98.3 F (36.8 C) 97.9 F (36.6 C) 98.4 F (36.9 C) 98.4 F (36.9 C)  TempSrc: Oral Oral Oral Oral  SpO2:  93%  100%  Weight:      Height:        Intake/Output Summary (Last 24 hours) at 01/24/2021 1255 Last data filed at 01/24/2021 1100 Gross per 24 hour  Intake 240 ml  Output 1600 ml  Net -1360 ml     Wt Readings from Last 3 Encounters:  01/23/21 53.8 kg  01/08/21 40.4 kg  12/29/20  40.4 kg   Physical Exam  General: Alert and oriented x 3, NAD  Cardiovascular: S1 S2 clear, 3/6 systolic murmur to carotids  Respiratory: Diminished breath sound at the bases  Gastrointestinal: Soft, dressing intact, ileostomy  Ext: no pedal edema bilaterally  Neuro: no new deficits  Musculoskeletal: No cyanosis, clubbing  Skin: No rashes  Psych: Normal affect and demeanor, alert and oriented x3    Data Reviewed:  I have personally reviewed following labs and imaging studies  Micro Results No results found for this or any previous visit (from the past 240 hour(s)).  Radiology Reports NM GI Blood Loss  Addendum Date: 01/08/2021   ADDENDUM REPORT: 01/08/2021 22:08 ADDENDUM: These results were discussed by telephone at the time of interpretation on 01/08/2021 at 10:07 pm to Dr. Havery Moros, who verbally acknowledged these results. Electronically Signed   By: Anner Crete M.D.   On: 01/08/2021 22:08   Result Date: 01/08/2021 CLINICAL DATA:  87 year old female with lower GI bleed. EXAM: NUCLEAR MEDICINE GASTROINTESTINAL BLEEDING SCAN TECHNIQUE: Sequential abdominal images were obtained following intravenous administration of Tc-49m labeled red blood cells. RADIOPHARMACEUTICALS:  19.8 mCi Tc-56m pertechnetate in-vitro labeled red cells. COMPARISON:   CT abdomen pelvis dated 01/08/2021. FINDINGS: There is physiologic uptake within the liver, kidneys, and cardiac blood pool. Physiologic activity noted in the major vasculature. There is radiotracer activity in the left lower quadrant conforming to the distal descending colon and sigmoid consistent with active GI bleed. Please note active diverticular bleed in the region of the mid descending colon is present on the earlier CT. IMPRESSION: Active GI bleed in the mid to distal descending colon corresponding to the diverticular bleed on the earlier CT. Electronically Signed: By: Anner Crete M.D. On: 01/08/2021 21:11   CT ABDOMEN PELVIS W CONTRAST  Result Date: 01/08/2021 CLINICAL DATA:  Melena. EXAM: CT ABDOMEN AND PELVIS WITH CONTRAST TECHNIQUE: Multidetector CT imaging of the abdomen and pelvis was performed using the standard protocol following bolus administration of intravenous contrast. CONTRAST:  3mL OMNIPAQUE IOHEXOL 300 MG/ML  SOLN COMPARISON:  None. FINDINGS: Lower chest: Minimal left pleural effusion is noted. Bibasilar subsegmental atelectasis is noted. Hepatobiliary: No focal liver abnormality is seen. No gallstones, gallbladder wall thickening, or biliary dilatation. Pancreas: Unremarkable. No pancreatic ductal dilatation or surrounding inflammatory changes. Spleen: Normal in size without focal abnormality. Adrenals/Urinary Tract: Adrenal glands are unremarkable. Kidneys are normal, without renal calculi, focal lesion, or hydronephrosis. Bladder is unremarkable. Stomach/Bowel: The stomach and appendix appear normal. No small bowel dilatation is noted. Postsurgical changes are seen involving the descending colon. The colon is dilated and fluid-filled. Moderate amount of stool is seen throughout the colon. No definite wall thickening is noted. Vascular/Lymphatic: Aortic atherosclerosis. No enlarged abdominal or pelvic lymph nodes. Reproductive: Uterus and bilateral adnexa are unremarkable. Other:  No abdominal wall hernia or abnormality. No abdominopelvic ascites. Musculoskeletal: Grade 1 anterolisthesis of L5-S1 is noted secondary to bilateral L5 spondylolysis. No acute osseous abnormality is noted. IMPRESSION: 1. Minimal left pleural effusion is noted with bibasilar subsegmental atelectasis. 2. Postsurgical changes are seen involving the descending colon. The colon is dilated and fluid-filled. Moderate amount of stool is seen throughout the colon. No definite wall thickening is noted. 3. Grade 1 anterolisthesis of L5-S1 secondary to bilateral L5 spondylolysis. Aortic Atherosclerosis (ICD10-I70.0). Electronically Signed   By: Marijo Conception M.D.   On: 01/08/2021 14:20   IR Angiogram Visceral Selective  Result Date: 01/12/2021 INDICATION: Recurrent lower GI bleeding. Positive CTA. Please perform mesenteric  arteriogram with potential embolization. EXAM: 1. ULTRASOUND GUIDANCE FOR ARTERIAL ACCESS 2. SELECTIVE INFERIOR MESENTERIC ARTERIOGRAM 3. SUB SELECTIVE LEFT COLIC ARTERIOGRAM 4. SUB SELECTIVE DISTAL ARCADE DIVISIONS OF THE LEFT COLIC ARTERY AND PERCUTANEOUS COIL EMBOLIZATION COMPARISON:  CTA abdomen and pelvis - 01/11/2021 Mesenteric arteriogram - 01/09/2021 Nuclear medicine tagged red blood cell bleeding scan - 01/08/2021 CT abdomen and pelvis - 01/08/2021 MEDICATIONS: None ANESTHESIA/SEDATION: Moderate (conscious) sedation was employed during this procedure. A total of Versed 0.5 mg was administered intravenously. Moderate Sedation Time: 60 minutes. The patient's level of consciousness and vital signs were monitored continuously by radiology nursing throughout the procedure under my direct supervision. CONTRAST:  45 cc Omnipaque 300 FLUOROSCOPY TIME:  19 minutes (846 mGy) COMPLICATIONS: SIR Level A - No therapy, no consequence. Procedure complicated by development of a small hematoma at the right groin access site. PROCEDURE: Informed consent was obtained from the patient following explanation of the  procedure, risks, benefits and alternatives. All questions were addressed. A time out was performed prior to the initiation of the procedure. Maximal barrier sterile technique utilized including caps, mask, sterile gowns, sterile gloves, large sterile drape, hand hygiene, and Betadine prep. The right femoral head was marked fluoroscopically. Under sterile conditions and local anesthesia, the right common femoral artery access was performed with a micropuncture needle. Under direct ultrasound guidance, the right common femoral was accessed with a micropuncture kit. An ultrasound image was saved for documentation purposes. This allowed for placement of a 5-French vascular sheath. A limited arteriogram was performed through the side arm of the sheath confirming appropriate access within the right common femoral artery. Over a Bentson wire, a Mickelson catheter was advanced the caudal aspect of the thoracic aorta where was reformed, back bled and flushed. The Mickelson catheter was then utilized to select the inferior mesenteric artery and a selective superior mesenteric arteriogram was performed. With the use of a fathom 14 microwire, a regular Renegade microcatheter was advanced to the level of the left colic artery and a selective left colic arteriogram was performed. The microcatheter was then utilized to select the inferior division of the left colic artery and a sub selective arteriogram was performed. The microcatheter was then advanced to select the distal arcade supplying the ill-defined area of extravasation involving the mid descending colon. Sub selective injection confirmed appropriate positioning and the vessel was percutaneously coil embolized with two overlapping 2 mm diameter interlock coils. The microcatheter was retracted to the level the left colic artery and a post embolization left colic arteriogram was performed. The microcatheter was then advanced to select the distal arcade felt to potentially  be supplying additional collateral supply to the ill-defined area of active extravasation involving the mid descending colon. Sub selective injection confirmed appropriate positioning and the vessel was percutaneously coil embolized with two overlapping 2 mm diameter interlock coils. The microcatheter was then retracted to the to the level pelvic artery and a selective left colic arteriogram was performed. Microcatheter was removed and a post embolization arteriogram was performed at the level of the IMA Images were reviewed and the procedure was terminated. All wires, catheters and sheaths were removed from the patient. Hemostasis was achieved at the right groin access site with manual compression. Procedure was complicated by development of a small hematoma at the right groin access site. The patient otherwise tolerated the procedure well without immediate post procedural complication. FINDINGS: Selective inferior mesenteric arteriogram demonstrates conventional branching pattern with ill-defined area of extravasation involving the mid descending colon compatible  with findings on preceding CTA. Selective left colic arteriogram redemonstrates the ill-defined area of extravasation within the mid descending colon supplied predominantly via the inferior division of the left colic artery with potential collateral supply from the superior division. Sub selective arteriogram of the inferior division the left colic artery confirms supply to the ill-defined area of extravasation and as such the supplying distal arcade was percutaneously coil embolized. Sub selective arteriogram of the superior division of the left colic artery suggests additional collateral supply to the ill-defined area of extravasation and as such the supplying distal arcade was percutaneously coil embolized. Completion left colic and inferior mesenteric arteriograms are negative for residual areas of extravasation or collateral supply to the mid  descending colon. Note, superior mesenteric arteriogram was not performed given exhaustive evaluation on dedicated mesenteric arteriogram performed 3 days prior (01/09/2021). IMPRESSION: Technically successful percutaneous coil embolization of distal arcades arising from both the superior and inferior divisions of the left colic artery supplying the ill-defined area of intraluminal contrast extravasation involving the mid descending colon compatible with the findings seen on preceding CTA. PLAN: - The patient is to remain flat for 4 hours with right leg straight (until at least 0730). - The patient will continue to experience several additional bloody bowel movements and may continue to require additional resuscitation (as the patient was bleeding both before and during the procedure), however ultimately I am hopeful she will stabilize in the coming days. Electronically Signed   By: Sandi Mariscal M.D.   On: 01/12/2021 07:24   IR Angiogram Visceral Selective  Result Date: 01/09/2021 CLINICAL DATA:  GI bleed, localized to proximal descending colon on CTA and scintigraphy EXAM: SELECTIVE VISCERAL ARTERIOGRAPHY; IR ULTRASOUND GUIDANCE VASC ACCESS RIGHT ANESTHESIA/SEDATION: Intravenous Fentanyl 57mcg and Versed or 1.$RemoveBef'5mg'UxCirGXiCc$  were administered as conscious sedation during continuous monitoring of the patient's level of consciousness and physiological / cardiorespiratory status by the radiology RN, with a total moderate sedation time of 41 minutes. MEDICATIONS: Lidocaine 1% subcutaneous CONTRAST:  94mL OMNIPAQUE IOHEXOL 300 MG/ML SOLN, 61mL OMNIPAQUE IOHEXOL 300 MG/ML SOLN, 30mL OMNIPAQUE IOHEXOL 300 MG/ML SOLN PROCEDURE: The procedure, risks (including but not limited to bleeding, infection, organ damage ), benefits, and alternatives were explained to the patient. Questions regarding the procedure were encouraged and answered. The patient understands and consents to the procedure. Right femoral region prepped and draped in  usual sterile fashion. Maximal barrier sterile technique was utilized including caps, mask, sterile gowns, sterile gloves, sterile drape, hand hygiene and skin antiseptic. The right common femoral artery was localized under ultrasound. Under real-time ultrasound guidance, the vessel was accessed with a 21-gauge micropuncture needle, exchanged over a 018 guidewire for a transitional dilator, through which a 035 guidewire was advanced. Over this, a 5 [place] vascular sheath was placed, through which a 5 [place] C2 catheter was advanced and used to selectively catheterize the superior mesenteric artery for selective arteriography in multiple projections. A coaxial Renegade microcatheter with a fathom guidewire was advanced and used for selective arteriography of 2 third order branches in multiple projections. The catheter and sheath were removed and hemostasis achieved with the aid of the Exoseal device after confirmatory femoral arteriography. The patient tolerated the procedure well. COMPLICATIONS: None immediate FINDINGS: No evidence of active extravasation, early draining vein, AVM, or other lesion to suggest a site or etiology of the patient's GI bleeding. Venous phase confirms patency of the portal venous system. IMPRESSION: 1. Negative superior mesenteric arteriogram. No evidence of active extravasation or other focal lesion  to suggest etiology of GI bleed. Electronically Signed   By: Lucrezia Europe M.D.   On: 01/09/2021 09:46   IR Angiogram Selective Each Additional Vessel  Result Date: 01/12/2021 INDICATION: Recurrent lower GI bleeding. Positive CTA. Please perform mesenteric arteriogram with potential embolization. EXAM: 1. ULTRASOUND GUIDANCE FOR ARTERIAL ACCESS 2. SELECTIVE INFERIOR MESENTERIC ARTERIOGRAM 3. SUB SELECTIVE LEFT COLIC ARTERIOGRAM 4. SUB SELECTIVE DISTAL ARCADE DIVISIONS OF THE LEFT COLIC ARTERY AND PERCUTANEOUS COIL EMBOLIZATION COMPARISON:  CTA abdomen and pelvis - 01/11/2021 Mesenteric  arteriogram - 01/09/2021 Nuclear medicine tagged red blood cell bleeding scan - 01/08/2021 CT abdomen and pelvis - 01/08/2021 MEDICATIONS: None ANESTHESIA/SEDATION: Moderate (conscious) sedation was employed during this procedure. A total of Versed 0.5 mg was administered intravenously. Moderate Sedation Time: 60 minutes. The patient's level of consciousness and vital signs were monitored continuously by radiology nursing throughout the procedure under my direct supervision. CONTRAST:  45 cc Omnipaque 300 FLUOROSCOPY TIME:  19 minutes (993 mGy) COMPLICATIONS: SIR Level A - No therapy, no consequence. Procedure complicated by development of a small hematoma at the right groin access site. PROCEDURE: Informed consent was obtained from the patient following explanation of the procedure, risks, benefits and alternatives. All questions were addressed. A time out was performed prior to the initiation of the procedure. Maximal barrier sterile technique utilized including caps, mask, sterile gowns, sterile gloves, large sterile drape, hand hygiene, and Betadine prep. The right femoral head was marked fluoroscopically. Under sterile conditions and local anesthesia, the right common femoral artery access was performed with a micropuncture needle. Under direct ultrasound guidance, the right common femoral was accessed with a micropuncture kit. An ultrasound image was saved for documentation purposes. This allowed for placement of a 5-French vascular sheath. A limited arteriogram was performed through the side arm of the sheath confirming appropriate access within the right common femoral artery. Over a Bentson wire, a Mickelson catheter was advanced the caudal aspect of the thoracic aorta where was reformed, back bled and flushed. The Mickelson catheter was then utilized to select the inferior mesenteric artery and a selective superior mesenteric arteriogram was performed. With the use of a fathom 14 microwire, a regular  Renegade microcatheter was advanced to the level of the left colic artery and a selective left colic arteriogram was performed. The microcatheter was then utilized to select the inferior division of the left colic artery and a sub selective arteriogram was performed. The microcatheter was then advanced to select the distal arcade supplying the ill-defined area of extravasation involving the mid descending colon. Sub selective injection confirmed appropriate positioning and the vessel was percutaneously coil embolized with two overlapping 2 mm diameter interlock coils. The microcatheter was retracted to the level the left colic artery and a post embolization left colic arteriogram was performed. The microcatheter was then advanced to select the distal arcade felt to potentially be supplying additional collateral supply to the ill-defined area of active extravasation involving the mid descending colon. Sub selective injection confirmed appropriate positioning and the vessel was percutaneously coil embolized with two overlapping 2 mm diameter interlock coils. The microcatheter was then retracted to the to the level pelvic artery and a selective left colic arteriogram was performed. Microcatheter was removed and a post embolization arteriogram was performed at the level of the IMA Images were reviewed and the procedure was terminated. All wires, catheters and sheaths were removed from the patient. Hemostasis was achieved at the right groin access site with manual compression. Procedure was complicated by development  of a small hematoma at the right groin access site. The patient otherwise tolerated the procedure well without immediate post procedural complication. FINDINGS: Selective inferior mesenteric arteriogram demonstrates conventional branching pattern with ill-defined area of extravasation involving the mid descending colon compatible with findings on preceding CTA. Selective left colic arteriogram redemonstrates  the ill-defined area of extravasation within the mid descending colon supplied predominantly via the inferior division of the left colic artery with potential collateral supply from the superior division. Sub selective arteriogram of the inferior division the left colic artery confirms supply to the ill-defined area of extravasation and as such the supplying distal arcade was percutaneously coil embolized. Sub selective arteriogram of the superior division of the left colic artery suggests additional collateral supply to the ill-defined area of extravasation and as such the supplying distal arcade was percutaneously coil embolized. Completion left colic and inferior mesenteric arteriograms are negative for residual areas of extravasation or collateral supply to the mid descending colon. Note, superior mesenteric arteriogram was not performed given exhaustive evaluation on dedicated mesenteric arteriogram performed 3 days prior (01/09/2021). IMPRESSION: Technically successful percutaneous coil embolization of distal arcades arising from both the superior and inferior divisions of the left colic artery supplying the ill-defined area of intraluminal contrast extravasation involving the mid descending colon compatible with the findings seen on preceding CTA. PLAN: - The patient is to remain flat for 4 hours with right leg straight (until at least 0730). - The patient will continue to experience several additional bloody bowel movements and may continue to require additional resuscitation (as the patient was bleeding both before and during the procedure), however ultimately I am hopeful she will stabilize in the coming days. Electronically Signed   By: Sandi Mariscal M.D.   On: 01/12/2021 07:24   IR Angiogram Selective Each Additional Vessel  Result Date: 01/12/2021 INDICATION: Recurrent lower GI bleeding. Positive CTA. Please perform mesenteric arteriogram with potential embolization. EXAM: 1. ULTRASOUND GUIDANCE FOR  ARTERIAL ACCESS 2. SELECTIVE INFERIOR MESENTERIC ARTERIOGRAM 3. SUB SELECTIVE LEFT COLIC ARTERIOGRAM 4. SUB SELECTIVE DISTAL ARCADE DIVISIONS OF THE LEFT COLIC ARTERY AND PERCUTANEOUS COIL EMBOLIZATION COMPARISON:  CTA abdomen and pelvis - 01/11/2021 Mesenteric arteriogram - 01/09/2021 Nuclear medicine tagged red blood cell bleeding scan - 01/08/2021 CT abdomen and pelvis - 01/08/2021 MEDICATIONS: None ANESTHESIA/SEDATION: Moderate (conscious) sedation was employed during this procedure. A total of Versed 0.5 mg was administered intravenously. Moderate Sedation Time: 60 minutes. The patient's level of consciousness and vital signs were monitored continuously by radiology nursing throughout the procedure under my direct supervision. CONTRAST:  45 cc Omnipaque 300 FLUOROSCOPY TIME:  19 minutes (329 mGy) COMPLICATIONS: SIR Level A - No therapy, no consequence. Procedure complicated by development of a small hematoma at the right groin access site. PROCEDURE: Informed consent was obtained from the patient following explanation of the procedure, risks, benefits and alternatives. All questions were addressed. A time out was performed prior to the initiation of the procedure. Maximal barrier sterile technique utilized including caps, mask, sterile gowns, sterile gloves, large sterile drape, hand hygiene, and Betadine prep. The right femoral head was marked fluoroscopically. Under sterile conditions and local anesthesia, the right common femoral artery access was performed with a micropuncture needle. Under direct ultrasound guidance, the right common femoral was accessed with a micropuncture kit. An ultrasound image was saved for documentation purposes. This allowed for placement of a 5-French vascular sheath. A limited arteriogram was performed through the side arm of the sheath confirming appropriate access within the  right common femoral artery. Over a Bentson wire, a Mickelson catheter was advanced the caudal aspect of  the thoracic aorta where was reformed, back bled and flushed. The Mickelson catheter was then utilized to select the inferior mesenteric artery and a selective superior mesenteric arteriogram was performed. With the use of a fathom 14 microwire, a regular Renegade microcatheter was advanced to the level of the left colic artery and a selective left colic arteriogram was performed. The microcatheter was then utilized to select the inferior division of the left colic artery and a sub selective arteriogram was performed. The microcatheter was then advanced to select the distal arcade supplying the ill-defined area of extravasation involving the mid descending colon. Sub selective injection confirmed appropriate positioning and the vessel was percutaneously coil embolized with two overlapping 2 mm diameter interlock coils. The microcatheter was retracted to the level the left colic artery and a post embolization left colic arteriogram was performed. The microcatheter was then advanced to select the distal arcade felt to potentially be supplying additional collateral supply to the ill-defined area of active extravasation involving the mid descending colon. Sub selective injection confirmed appropriate positioning and the vessel was percutaneously coil embolized with two overlapping 2 mm diameter interlock coils. The microcatheter was then retracted to the to the level pelvic artery and a selective left colic arteriogram was performed. Microcatheter was removed and a post embolization arteriogram was performed at the level of the IMA Images were reviewed and the procedure was terminated. All wires, catheters and sheaths were removed from the patient. Hemostasis was achieved at the right groin access site with manual compression. Procedure was complicated by development of a small hematoma at the right groin access site. The patient otherwise tolerated the procedure well without immediate post procedural complication.  FINDINGS: Selective inferior mesenteric arteriogram demonstrates conventional branching pattern with ill-defined area of extravasation involving the mid descending colon compatible with findings on preceding CTA. Selective left colic arteriogram redemonstrates the ill-defined area of extravasation within the mid descending colon supplied predominantly via the inferior division of the left colic artery with potential collateral supply from the superior division. Sub selective arteriogram of the inferior division the left colic artery confirms supply to the ill-defined area of extravasation and as such the supplying distal arcade was percutaneously coil embolized. Sub selective arteriogram of the superior division of the left colic artery suggests additional collateral supply to the ill-defined area of extravasation and as such the supplying distal arcade was percutaneously coil embolized. Completion left colic and inferior mesenteric arteriograms are negative for residual areas of extravasation or collateral supply to the mid descending colon. Note, superior mesenteric arteriogram was not performed given exhaustive evaluation on dedicated mesenteric arteriogram performed 3 days prior (01/09/2021). IMPRESSION: Technically successful percutaneous coil embolization of distal arcades arising from both the superior and inferior divisions of the left colic artery supplying the ill-defined area of intraluminal contrast extravasation involving the mid descending colon compatible with the findings seen on preceding CTA. PLAN: - The patient is to remain flat for 4 hours with right leg straight (until at least 0730). - The patient will continue to experience several additional bloody bowel movements and may continue to require additional resuscitation (as the patient was bleeding both before and during the procedure), however ultimately I am hopeful she will stabilize in the coming days. Electronically Signed   By: Sandi Mariscal  M.D.   On: 01/12/2021 07:24   IR Angiogram Selective Each Additional Vessel  Result Date:  01/12/2021 INDICATION: Recurrent lower GI bleeding. Positive CTA. Please perform mesenteric arteriogram with potential embolization. EXAM: 1. ULTRASOUND GUIDANCE FOR ARTERIAL ACCESS 2. SELECTIVE INFERIOR MESENTERIC ARTERIOGRAM 3. SUB SELECTIVE LEFT COLIC ARTERIOGRAM 4. SUB SELECTIVE DISTAL ARCADE DIVISIONS OF THE LEFT COLIC ARTERY AND PERCUTANEOUS COIL EMBOLIZATION COMPARISON:  CTA abdomen and pelvis - 01/11/2021 Mesenteric arteriogram - 01/09/2021 Nuclear medicine tagged red blood cell bleeding scan - 01/08/2021 CT abdomen and pelvis - 01/08/2021 MEDICATIONS: None ANESTHESIA/SEDATION: Moderate (conscious) sedation was employed during this procedure. A total of Versed 0.5 mg was administered intravenously. Moderate Sedation Time: 60 minutes. The patient's level of consciousness and vital signs were monitored continuously by radiology nursing throughout the procedure under my direct supervision. CONTRAST:  45 cc Omnipaque 300 FLUOROSCOPY TIME:  19 minutes (762 mGy) COMPLICATIONS: SIR Level A - No therapy, no consequence. Procedure complicated by development of a small hematoma at the right groin access site. PROCEDURE: Informed consent was obtained from the patient following explanation of the procedure, risks, benefits and alternatives. All questions were addressed. A time out was performed prior to the initiation of the procedure. Maximal barrier sterile technique utilized including caps, mask, sterile gowns, sterile gloves, large sterile drape, hand hygiene, and Betadine prep. The right femoral head was marked fluoroscopically. Under sterile conditions and local anesthesia, the right common femoral artery access was performed with a micropuncture needle. Under direct ultrasound guidance, the right common femoral was accessed with a micropuncture kit. An ultrasound image was saved for documentation purposes. This allowed  for placement of a 5-French vascular sheath. A limited arteriogram was performed through the side arm of the sheath confirming appropriate access within the right common femoral artery. Over a Bentson wire, a Mickelson catheter was advanced the caudal aspect of the thoracic aorta where was reformed, back bled and flushed. The Mickelson catheter was then utilized to select the inferior mesenteric artery and a selective superior mesenteric arteriogram was performed. With the use of a fathom 14 microwire, a regular Renegade microcatheter was advanced to the level of the left colic artery and a selective left colic arteriogram was performed. The microcatheter was then utilized to select the inferior division of the left colic artery and a sub selective arteriogram was performed. The microcatheter was then advanced to select the distal arcade supplying the ill-defined area of extravasation involving the mid descending colon. Sub selective injection confirmed appropriate positioning and the vessel was percutaneously coil embolized with two overlapping 2 mm diameter interlock coils. The microcatheter was retracted to the level the left colic artery and a post embolization left colic arteriogram was performed. The microcatheter was then advanced to select the distal arcade felt to potentially be supplying additional collateral supply to the ill-defined area of active extravasation involving the mid descending colon. Sub selective injection confirmed appropriate positioning and the vessel was percutaneously coil embolized with two overlapping 2 mm diameter interlock coils. The microcatheter was then retracted to the to the level pelvic artery and a selective left colic arteriogram was performed. Microcatheter was removed and a post embolization arteriogram was performed at the level of the IMA Images were reviewed and the procedure was terminated. All wires, catheters and sheaths were removed from the patient. Hemostasis was  achieved at the right groin access site with manual compression. Procedure was complicated by development of a small hematoma at the right groin access site. The patient otherwise tolerated the procedure well without immediate post procedural complication. FINDINGS: Selective inferior mesenteric arteriogram demonstrates conventional branching pattern  with ill-defined area of extravasation involving the mid descending colon compatible with findings on preceding CTA. Selective left colic arteriogram redemonstrates the ill-defined area of extravasation within the mid descending colon supplied predominantly via the inferior division of the left colic artery with potential collateral supply from the superior division. Sub selective arteriogram of the inferior division the left colic artery confirms supply to the ill-defined area of extravasation and as such the supplying distal arcade was percutaneously coil embolized. Sub selective arteriogram of the superior division of the left colic artery suggests additional collateral supply to the ill-defined area of extravasation and as such the supplying distal arcade was percutaneously coil embolized. Completion left colic and inferior mesenteric arteriograms are negative for residual areas of extravasation or collateral supply to the mid descending colon. Note, superior mesenteric arteriogram was not performed given exhaustive evaluation on dedicated mesenteric arteriogram performed 3 days prior (01/09/2021). IMPRESSION: Technically successful percutaneous coil embolization of distal arcades arising from both the superior and inferior divisions of the left colic artery supplying the ill-defined area of intraluminal contrast extravasation involving the mid descending colon compatible with the findings seen on preceding CTA. PLAN: - The patient is to remain flat for 4 hours with right leg straight (until at least 0730). - The patient will continue to experience several  additional bloody bowel movements and may continue to require additional resuscitation (as the patient was bleeding both before and during the procedure), however ultimately I am hopeful she will stabilize in the coming days. Electronically Signed   By: Sandi Mariscal M.D.   On: 01/12/2021 07:24   IR US Guide Vasc Access Right  Result Date: 01/12/2021 INDICATION: Recurrent lower GI bleeding. Positive CTA. Please perform mesenteric arteriogram with potential embolization. EXAM: 1. ULTRASOUND GUIDANCE FOR ARTERIAL ACCESS 2. SELECTIVE INFERIOR MESENTERIC ARTERIOGRAM 3. SUB SELECTIVE LEFT COLIC ARTERIOGRAM 4. SUB SELECTIVE DISTAL ARCADE DIVISIONS OF THE LEFT COLIC ARTERY AND PERCUTANEOUS COIL EMBOLIZATION COMPARISON:  CTA abdomen and pelvis - 01/11/2021 Mesenteric arteriogram - 01/09/2021 Nuclear medicine tagged red blood cell bleeding scan - 01/08/2021 CT abdomen and pelvis - 01/08/2021 MEDICATIONS: None ANESTHESIA/SEDATION: Moderate (conscious) sedation was employed during this procedure. A total of Versed 0.5 mg was administered intravenously. Moderate Sedation Time: 60 minutes. The patient's level of consciousness and vital signs were monitored continuously by radiology nursing throughout the procedure under my direct supervision. CONTRAST:  45 cc Omnipaque 300 FLUOROSCOPY TIME:  19 minutes (622 mGy) COMPLICATIONS: SIR Level A - No therapy, no consequence. Procedure complicated by development of a small hematoma at the right groin access site. PROCEDURE: Informed consent was obtained from the patient following explanation of the procedure, risks, benefits and alternatives. All questions were addressed. A time out was performed prior to the initiation of the procedure. Maximal barrier sterile technique utilized including caps, mask, sterile gowns, sterile gloves, large sterile drape, hand hygiene, and Betadine prep. The right femoral head was marked fluoroscopically. Under sterile conditions and local anesthesia,  the right common femoral artery access was performed with a micropuncture needle. Under direct ultrasound guidance, the right common femoral was accessed with a micropuncture kit. An ultrasound image was saved for documentation purposes. This allowed for placement of a 5-French vascular sheath. A limited arteriogram was performed through the side arm of the sheath confirming appropriate access within the right common femoral artery. Over a Bentson wire, a Mickelson catheter was advanced the caudal aspect of the thoracic aorta where was reformed, back bled and flushed. The Mickelson catheter was  then utilized to select the inferior mesenteric artery and a selective superior mesenteric arteriogram was performed. With the use of a fathom 14 microwire, a regular Renegade microcatheter was advanced to the level of the left colic artery and a selective left colic arteriogram was performed. The microcatheter was then utilized to select the inferior division of the left colic artery and a sub selective arteriogram was performed. The microcatheter was then advanced to select the distal arcade supplying the ill-defined area of extravasation involving the mid descending colon. Sub selective injection confirmed appropriate positioning and the vessel was percutaneously coil embolized with two overlapping 2 mm diameter interlock coils. The microcatheter was retracted to the level the left colic artery and a post embolization left colic arteriogram was performed. The microcatheter was then advanced to select the distal arcade felt to potentially be supplying additional collateral supply to the ill-defined area of active extravasation involving the mid descending colon. Sub selective injection confirmed appropriate positioning and the vessel was percutaneously coil embolized with two overlapping 2 mm diameter interlock coils. The microcatheter was then retracted to the to the level pelvic artery and a selective left colic  arteriogram was performed. Microcatheter was removed and a post embolization arteriogram was performed at the level of the IMA Images were reviewed and the procedure was terminated. All wires, catheters and sheaths were removed from the patient. Hemostasis was achieved at the right groin access site with manual compression. Procedure was complicated by development of a small hematoma at the right groin access site. The patient otherwise tolerated the procedure well without immediate post procedural complication. FINDINGS: Selective inferior mesenteric arteriogram demonstrates conventional branching pattern with ill-defined area of extravasation involving the mid descending colon compatible with findings on preceding CTA. Selective left colic arteriogram redemonstrates the ill-defined area of extravasation within the mid descending colon supplied predominantly via the inferior division of the left colic artery with potential collateral supply from the superior division. Sub selective arteriogram of the inferior division the left colic artery confirms supply to the ill-defined area of extravasation and as such the supplying distal arcade was percutaneously coil embolized. Sub selective arteriogram of the superior division of the left colic artery suggests additional collateral supply to the ill-defined area of extravasation and as such the supplying distal arcade was percutaneously coil embolized. Completion left colic and inferior mesenteric arteriograms are negative for residual areas of extravasation or collateral supply to the mid descending colon. Note, superior mesenteric arteriogram was not performed given exhaustive evaluation on dedicated mesenteric arteriogram performed 3 days prior (01/09/2021). IMPRESSION: Technically successful percutaneous coil embolization of distal arcades arising from both the superior and inferior divisions of the left colic artery supplying the ill-defined area of intraluminal  contrast extravasation involving the mid descending colon compatible with the findings seen on preceding CTA. PLAN: - The patient is to remain flat for 4 hours with right leg straight (until at least 0730). - The patient will continue to experience several additional bloody bowel movements and may continue to require additional resuscitation (as the patient was bleeding both before and during the procedure), however ultimately I am hopeful she will stabilize in the coming days. Electronically Signed   By: Sandi Mariscal M.D.   On: 01/12/2021 07:24   IR US Guide Vasc Access Right  Result Date: 01/09/2021 CLINICAL DATA:  GI bleed, localized to proximal descending colon on CTA and scintigraphy EXAM: SELECTIVE VISCERAL ARTERIOGRAPHY; IR ULTRASOUND GUIDANCE VASC ACCESS RIGHT ANESTHESIA/SEDATION: Intravenous Fentanyl 69mcg and Versed or  1.25m were administered as conscious sedation during continuous monitoring of the patient's level of consciousness and physiological / cardiorespiratory status by the radiology RN, with a total moderate sedation time of 41 minutes. MEDICATIONS: Lidocaine 1% subcutaneous CONTRAST:  325mOMNIPAQUE IOHEXOL 300 MG/ML SOLN, 3628mMNIPAQUE IOHEXOL 300 MG/ML SOLN, 30m23mNIPAQUE IOHEXOL 300 MG/ML SOLN PROCEDURE: The procedure, risks (including but not limited to bleeding, infection, organ damage ), benefits, and alternatives were explained to the patient. Questions regarding the procedure were encouraged and answered. The patient understands and consents to the procedure. Right femoral region prepped and draped in usual sterile fashion. Maximal barrier sterile technique was utilized including caps, mask, sterile gowns, sterile gloves, sterile drape, hand hygiene and skin antiseptic. The right common femoral artery was localized under ultrasound. Under real-time ultrasound guidance, the vessel was accessed with a 21-gauge micropuncture needle, exchanged over a 018 guidewire for a transitional  dilator, through which a 035 guidewire was advanced. Over this, a 5 FrenPakistancular sheath was placed, through which a 5 FrenPakistancatheter was advanced and used to selectively catheterize the superior mesenteric artery for selective arteriography in multiple projections. A coaxial Renegade microcatheter with a fathom guidewire was advanced and used for selective arteriography of 2 third order branches in multiple projections. The catheter and sheath were removed and hemostasis achieved with the aid of the Exoseal device after confirmatory femoral arteriography. The patient tolerated the procedure well. COMPLICATIONS: None immediate FINDINGS: No evidence of active extravasation, early draining vein, AVM, or other lesion to suggest a site or etiology of the patient's GI bleeding. Venous phase confirms patency of the portal venous system. IMPRESSION: 1. Negative superior mesenteric arteriogram. No evidence of active extravasation or other focal lesion to suggest etiology of GI bleed. Electronically Signed   By: D  HLucrezia [place].   On: 01/09/2021 09:46   DG CHEST PORT 1 VIEW  Result Date: 01/17/2021 CLINICAL DATA:  Recurrent bleed.  Shortness of breath. EXAM: PORTABLE CHEST 1 VIEW COMPARISON:  January 16, 2021 FINDINGS: Diffuse increased hazy and interstitial opacities with bilateral small effusions, unchanged. A left central line terminates in the central SVC. The cardiomediastinal silhouette is unchanged. No pneumothorax. IMPRESSION: The pulmonary opacities and small effusions are most consistent with pulmonary edema/volume overload with mild cardiomegaly. An atypical infection is not completely excluded. Recommend clinical correlation. Electronically Signed   By: DaviDorise Bullion M.D   On: 01/17/2021 15:58   DG CHEST PORT 1 VIEW  Result Date: 01/16/2021 CLINICAL DATA:  Central catheter placement EXAM: PORTABLE CHEST 1 VIEW COMPARISON:  CT angio abdomen and pelvis 01/11/2021 FINDINGS: Left subclavian approach  central venous catheter tip terminates in the mid SVC. Telemetry leads and external support devices overlie the chest. Low lung volumes and atelectasis. Layering bilateral effusions. Some patchy and streaky opacities are present throughout both lungs, right greater than left. No visible pneumothorax. The aorta is calcified. The remaining cardiomediastinal contours are unremarkable. No acute osseous or soft tissue abnormality. IMPRESSION: Left subclavian approach central venous catheter tip terminates in the mid SVC. Developing bilateral effusions. Heterogeneous opacities in the lungs, could reflect developing infection and/or edema. Electronically Signed   By: PricLovena Le.   On: 01/16/2021 21:10   ECHOCARDIOGRAM COMPLETE  Result Date: 01/15/2021    ECHOCARDIOGRAM REPORT   Patient Name:   PEGGSCARLETTROSE COSTILOWe of Exam: 01/15/2021 Medical Rec #:  [id number]:       61.5 in Accession #:  [id number]   Weight:       88.0 lb Date of Birth:  [birth year]     BSA:          1.340 m Patient Age:    71 years     BP:           127/78 mmHg Patient Gender: F            HR:           83 bpm. Exam Location:  Inpatient Procedure: 2D Echo, Color Doppler, Cardiac Doppler and 3D Echo Indications:    Other Cardiac Sounds R01.2; Z01.818 Encounter for other                 preprocedural examination  History:        Patient has prior history of Echocardiogram examinations, most                 recent 08/07/2012. COPD; Risk Factors:Hypertension and                 Dyslipidemia.  Sonographer:    Jonelle Sidle Dance Referring Phys: [id number] ELIZABETH [place]  1. There is severe aortic stenosis with AVA 0.84cm2, mean gradient 54 mmHg, peak gradient 103mmHg, Vmax 4.29m/s, DI 0.27.  2. The aortic valve is calcified. There is severe calcifcation of the aortic valve. There is severe thickening of the aortic valve. Aortic valve regurgitation is mild.  3. Left ventricular ejection fraction, by estimation, is 60 to 65%. The left  ventricle has normal function. The left ventricle has no regional wall motion abnormalities.  4. There is severe hypertrophy of the basal septum measuring 1.8cm. The rest of the LV segments demonstrate moderate hypertrophy. An intracavitary gradient is present with no evidence of SAM.  5. Left ventricular diastolic parameters are consistent with Grade II diastolic dysfunction (pseudonormalization).  6. Elevated left atrial pressure.  7. Right ventricular systolic function is normal. The right ventricular size is normal.  8. Left atrial size was moderately dilated.  9. There is moderate thickening of the mitral valve leaflet(s). There is mild calcification of the mitral valve leaflet(s). Mild mitral annular calcification. Mild mitral valve regurgitation. 10. The inferior vena cava is dilated in size with >50% respiratory variability, suggesting right atrial pressure of 8 mmHg. Comparison(s): Compared to echo report in 2013, there is now severe aortic stenosis. FINDINGS  Left Ventricle: Left ventricular ejection fraction, by estimation, is 65 to 70%. The left ventricle has normal function. The left ventricle has no regional wall motion abnormalities. The left ventricular internal cavity size was normal in size. There is  servere hypertrophy of the basal septal segments. The rest of the LV segments demonstrate moderate hypertrophy. Left ventricular diastolic parameters are consistent with Grade II diastolic dysfunction (pseudonormalization). Elevated left atrial pressure. The E/e' is 50. Right Ventricle: The right ventricular size is normal. No increase in right ventricular wall thickness. Right ventricular systolic function is normal. Left Atrium: Left atrial size was moderately dilated. Right Atrium: Right atrial size was normal in size. Pericardium: Trivial pericardial effusion is present. Mitral Valve: The mitral valve is abnormal. There is moderate thickening of the mitral valve leaflet(s). There is mild  calcification of the mitral valve leaflet(s). Mild mitral annular calcification. Mild mitral valve regurgitation. Tricuspid Valve: The tricuspid valve is normal in structure. Tricuspid valve regurgitation is trivial. Aortic Valve: There is severe aortic stenosis with AVA 0.84cm2, mean gradient 54 mmHg, peak gradient 82mmHg, Vmax 4.54m/s, DI 0.27.  The aortic valve is calcified. There is severe calcifcation of the aortic valve. There is severe thickening of the aortic valve. Aortic valve regurgitation is mild. Aortic regurgitation PHT measures 373 msec. Pulmonic Valve: The pulmonic valve was normal in structure. Pulmonic valve regurgitation is trivial. Aorta: The aortic root and ascending aorta are structurally normal, with no evidence of dilitation. Venous: The inferior vena cava is dilated in size with greater than 50% respiratory variability, suggesting right atrial pressure of 8 mmHg. IAS/Shunts: No atrial level shunt detected by color flow Doppler.  LEFT VENTRICLE PLAX 2D LVIDd:         3.30 cm  Diastology LVIDs:         2.10 cm  LV e' medial:    5.66 cm/s LV PW:         1.30 cm  LV E/e' medial:  28.6 LV IVS:        1.20 cm  LV e' lateral:   4.35 cm/s LVOT diam:     2.00 cm  LV E/e' lateral: 37.2 LV SV:         83 LV SV Index:   62 LVOT Area:     3.14 cm  RIGHT VENTRICLE             IVC RV Basal diam:  2.30 cm     IVC diam: 2.30 cm RV S prime:     14.10 cm/s TAPSE (M-mode): 1.6 cm LEFT ATRIUM             Index       RIGHT ATRIUM          Index LA diam:        3.70 cm 2.76 cm/m  RA Area:     9.22 cm LA Vol (A2C):   36.5 ml 27.23 ml/m RA Volume:   16.60 ml 12.39 ml/m LA Vol (A4C):   56.0 ml 41.78 ml/m LA Biplane Vol: 46.2 ml 34.47 ml/m  AORTIC VALVE AV Area (Vmax):    0.84 cm AV Area (Vmean):   0.86 cm AV Area (VTI):     0.93 cm AV Vmax:           424.00 cm/s AV Vmean:          307.200 cm/s AV VTI:            0.885 m AV Peak Grad:      71.9 mmHg AV Mean Grad:      43.6 mmHg LVOT Vmax:         114.00 cm/s  LVOT Vmean:        84.300 cm/s LVOT VTI:          0.263 m LVOT/AV VTI ratio: 0.30 AI PHT:            373 msec  AORTA Ao Root diam: 3.20 cm Ao Asc diam:  3.10 cm MITRAL VALVE MV Area (PHT): 2.54 cm     SHUNTS MV Decel Time: 299 msec     Systemic VTI:  0.26 m MV E velocity: 162.00 cm/s  Systemic Diam: 2.00 cm MV A velocity: 145.00 cm/s MV E/A ratio:  1.12 Gwyndolyn Kaufman MD Electronically signed by Gwyndolyn Kaufman MD Signature Date/Time: 01/15/2021/10:43:34 AM    Final    IR EMBO ARTERIAL NOT HEMORR HEMANG INC GUIDE ROADMAPPING  Result Date: 01/12/2021 INDICATION: Recurrent lower GI bleeding. Positive CTA. Please perform mesenteric arteriogram with potential embolization. EXAM: 1. ULTRASOUND GUIDANCE FOR ARTERIAL ACCESS 2. SELECTIVE INFERIOR MESENTERIC ARTERIOGRAM  3. SUB SELECTIVE LEFT COLIC ARTERIOGRAM 4. SUB SELECTIVE DISTAL ARCADE DIVISIONS OF THE LEFT COLIC ARTERY AND PERCUTANEOUS COIL EMBOLIZATION COMPARISON:  CTA abdomen and pelvis - 01/11/2021 Mesenteric arteriogram - 01/09/2021 Nuclear medicine tagged red blood cell bleeding scan - 01/08/2021 CT abdomen and pelvis - 01/08/2021 MEDICATIONS: None ANESTHESIA/SEDATION: Moderate (conscious) sedation was employed during this procedure. A total of Versed 0.5 mg was administered intravenously. Moderate Sedation Time: 60 minutes. The patient's level of consciousness and vital signs were monitored continuously by radiology nursing throughout the procedure under my direct supervision. CONTRAST:  45 cc Omnipaque 300 FLUOROSCOPY TIME:  19 minutes (330 mGy) COMPLICATIONS: SIR Level A - No therapy, no consequence. Procedure complicated by development of a small hematoma at the right groin access site. PROCEDURE: Informed consent was obtained from the patient following explanation of the procedure, risks, benefits and alternatives. All questions were addressed. A time out was performed prior to the initiation of the procedure. Maximal barrier sterile technique utilized  including caps, mask, sterile gowns, sterile gloves, large sterile drape, hand hygiene, and Betadine prep. The right femoral head was marked fluoroscopically. Under sterile conditions and local anesthesia, the right common femoral artery access was performed with a micropuncture needle. Under direct ultrasound guidance, the right common femoral was accessed with a micropuncture kit. An ultrasound image was saved for documentation purposes. This allowed for placement of a 5-French vascular sheath. A limited arteriogram was performed through the side arm of the sheath confirming appropriate access within the right common femoral artery. Over a Bentson wire, a Mickelson catheter was advanced the caudal aspect of the thoracic aorta where was reformed, back bled and flushed. The Mickelson catheter was then utilized to select the inferior mesenteric artery and a selective superior mesenteric arteriogram was performed. With the use of a fathom 14 microwire, a regular Renegade microcatheter was advanced to the level of the left colic artery and a selective left colic arteriogram was performed. The microcatheter was then utilized to select the inferior division of the left colic artery and a sub selective arteriogram was performed. The microcatheter was then advanced to select the distal arcade supplying the ill-defined area of extravasation involving the mid descending colon. Sub selective injection confirmed appropriate positioning and the vessel was percutaneously coil embolized with two overlapping 2 mm diameter interlock coils. The microcatheter was retracted to the level the left colic artery and a post embolization left colic arteriogram was performed. The microcatheter was then advanced to select the distal arcade felt to potentially be supplying additional collateral supply to the ill-defined area of active extravasation involving the mid descending colon. Sub selective injection confirmed appropriate positioning  and the vessel was percutaneously coil embolized with two overlapping 2 mm diameter interlock coils. The microcatheter was then retracted to the to the level pelvic artery and a selective left colic arteriogram was performed. Microcatheter was removed and a post embolization arteriogram was performed at the level of the IMA Images were reviewed and the procedure was terminated. All wires, catheters and sheaths were removed from the patient. Hemostasis was achieved at the right groin access site with manual compression. Procedure was complicated by development of a small hematoma at the right groin access site. The patient otherwise tolerated the procedure well without immediate post procedural complication. FINDINGS: Selective inferior mesenteric arteriogram demonstrates conventional branching pattern with ill-defined area of extravasation involving the mid descending colon compatible with findings on preceding CTA. Selective left colic arteriogram redemonstrates the ill-defined area of extravasation within  the mid descending colon supplied predominantly via the inferior division of the left colic artery with potential collateral supply from the superior division. Sub selective arteriogram of the inferior division the left colic artery confirms supply to the ill-defined area of extravasation and as such the supplying distal arcade was percutaneously coil embolized. Sub selective arteriogram of the superior division of the left colic artery suggests additional collateral supply to the ill-defined area of extravasation and as such the supplying distal arcade was percutaneously coil embolized. Completion left colic and inferior mesenteric arteriograms are negative for residual areas of extravasation or collateral supply to the mid descending colon. Note, superior mesenteric arteriogram was not performed given exhaustive evaluation on dedicated mesenteric arteriogram performed 3 days prior (01/09/2021). IMPRESSION:  Technically successful percutaneous coil embolization of distal arcades arising from both the superior and inferior divisions of the left colic artery supplying the ill-defined area of intraluminal contrast extravasation involving the mid descending colon compatible with the findings seen on preceding CTA. PLAN: - The patient is to remain flat for 4 hours with right leg straight (until at least 0730). - The patient will continue to experience several additional bloody bowel movements and may continue to require additional resuscitation (as the patient was bleeding both before and during the procedure), however ultimately I am hopeful she will stabilize in the coming days. Electronically Signed   By: Sandi Mariscal M.D.   On: 01/12/2021 07:24   CT Angio Abd/Pel W and/or Wo Contrast  Result Date: 01/11/2021 CLINICAL DATA:  Gastrointestinal bleed.  bright red blood. EXAM: CTA ABDOMEN AND PELVIS WITHOUT AND WITH CONTRAST TECHNIQUE: Multidetector CT imaging of the abdomen and pelvis was performed using the standard protocol during bolus administration of intravenous contrast. Multiplanar reconstructed images and MIPs were obtained and reviewed to evaluate the vascular anatomy. CONTRAST:  156mL OMNIPAQUE IOHEXOL 350 MG/ML SOLN COMPARISON:  Nuclear medicine gastrointestinal bleeding scan 01/08/2021, CT abdomen pelvis 01/08/2021 FINDINGS: VASCULAR Aorta: Severe atherosclerotic plaque. Normal caliber aorta without aneurysm, dissection, vasculitis or significant stenosis. Celiac: Patent without evidence of aneurysm, dissection, vasculitis or significant stenosis. SMA: Patent without evidence of aneurysm, dissection, vasculitis or significant stenosis. Renals: Mild atherosclerotic plaque of the origins. Both renal arteries are patent without evidence of aneurysm, dissection, vasculitis, fibromuscular dysplasia or significant stenosis. IMA: Patent without evidence of aneurysm, dissection, vasculitis or significant stenosis.  Inflow: Severe atherosclerotic plaque. Patent without evidence of aneurysm, dissection, vasculitis or significant stenosis. Proximal Outflow: At least moderate atherosclerotic plaque. Bilateral common femoral and visualized portions of the superficial and profunda femoral arteries are patent without evidence of aneurysm, dissection, vasculitis or significant stenosis. Veins: The hepatic, portal, splenic, superior mesenteric veins are patent. NON-VASCULAR Lower chest: Interval development of a 1.6 x 1.3 cm right lower lobe pulmonary nodular like consolidation likely represents atelectasis (not visualized on CT 2 days ago. Subsegmental atelectasis within bilateral lower lobes. Trace left pleural effusion. Hepatobiliary: No focal liver abnormality. No gallstones, gallbladder wall thickening, or pericholecystic fluid. No biliary dilatation. Pancreas: No focal lesion. Normal pancreatic contour. No surrounding inflammatory changes. No main pancreatic ductal dilatation. Spleen: Normal in size without focal abnormality. Adrenals/Urinary Tract: No adrenal nodule bilaterally. Bilateral kidneys enhance symmetrically. Subcentimeter hypodensities within the kidneys are too small to characterize. No hydronephrosis. No hydroureter. The urinary bladder is unremarkable. Stomach/Bowel: Left distal descending colon/sigmoid colon reanastomosis. Layering hyperdensity within the mid to distal descending colon is noted to slightly increase in density on the portal venous phase but is also not visualized on  noncontrast study. Associated slightly hyperdense material within the colonic lumen distally. The colon is noted to be fluid-filled past the splenic flexure. No bowel wall thickening or dilatation. The appendix not definitely identified. Lymphatic: No lymphadenopathy. Reproductive: Status post hysterectomy. No adnexal masses. Other: No intraperitoneal free fluid. No intraperitoneal free gas. No organized fluid collection.  Musculoskeletal: No abdominal wall hernia or abnormality. No suspicious lytic or blastic osseous lesions. No acute displaced fracture. Multilevel degenerative changes of the spine. 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 IMPRESSION: VASCULAR Aortic Atherosclerosis (ICD10-I70.0). NON-VASCULAR Mid to distal descending colon gastrointestinal hemorrhage that is located proximal to the colonic anastomosis. Electronically Signed   By: Tish Frederickson M.D.   On: 01/11/2021 22:36    Lab Data:  CBC: Recent Labs  Lab 01/19/21 0403 01/20/21 0706 01/21/21 0553 01/22/21 0300 01/24/21 0418  WBC 13.3* 13.3* 15.9* 14.9* 13.6*  HGB 8.7* 9.8* 9.7* 8.9* 8.7*  HCT 25.9* 30.5* 30.6* 27.6* 27.4*  MCV 87.5 90.8 90.8 90.5 92.9  PLT 369 504* 686* 639* 761*   Basic Metabolic Panel: Recent Labs  Lab 01/18/21 0526 01/19/21 0403 01/19/21 1346 01/20/21 0706 01/21/21 0553 01/22/21 0300  NA 133* 131*  --  133* 135 136  K 4.6 2.9* 4.0 4.6 4.2 3.8  CL 102 94*  --  95* 94* 94*  CO2 25 31  --  32 30 36*  GLUCOSE 102* 145*  --  109* 91 107*  BUN 13 7*  --  10 16 26*  CREATININE 0.54 0.61  --  0.66 0.80 0.82  CALCIUM 6.9* 6.9*  --  7.7* 7.8* 7.8*  MG 1.9 1.5*  --  2.2  --   --    GFR: Estimated Creatinine Clearance: 38.8 mL/min (by C-G formula based on SCr of 0.82 mg/dL). Liver Function Tests: Recent Labs  Lab 01/21/21 0553  AST 15  ALT 15  ALKPHOS 46  BILITOT 1.0  PROT 4.5*  ALBUMIN 1.9*   No results for input(s): LIPASE, AMYLASE in the last 168 hours. No results for input(s): AMMONIA in the last 168 hours. Coagulation Profile: No results for input(s): INR, PROTIME in the last 168 hours. Cardiac Enzymes: No results for input(s): CKTOTAL, CKMB, CKMBINDEX, TROPONINI in the last 168 hours. BNP (last 3 results) No results for input(s): PROBNP in the last 8760 hours. HbA1C: No results for input(s): HGBA1C in the last 72 hours. CBG: Recent  Labs  Lab 01/23/21 0755 01/23/21 1229 01/23/21 1627 01/24/21 0745 01/24/21 1134  GLUCAP 93 140* 150* 90 153*   Lipid Profile: No results for input(s): CHOL, HDL, LDLCALC, TRIG, CHOLHDL, LDLDIRECT in the last 72 hours. Thyroid Function Tests: No results for input(s): TSH, T4TOTAL, FREET4, T3FREE, THYROIDAB in the last 72 hours. Anemia Panel: No results for input(s): VITAMINB12, FOLATE, FERRITIN, TIBC, IRON, RETICCTPCT in the last 72 hours. Urine analysis:    Component Value Date/Time   COLORURINE YELLOW 03/09/2013 2028   APPEARANCEUR CLEAR 03/09/2013 2028   LABSPEC 1.036 (H) 03/09/2013 2028   PHURINE 6.5 03/09/2013 2028   GLUCOSEU 500 (A) 03/09/2013 2028   HGBUR NEGATIVE 03/09/2013 2028   BILIRUBINUR NEGATIVE 03/09/2013 2028   KETONESUR 15 (A) 03/09/2013 2028   PROTEINUR NEGATIVE 03/09/2013 2028   UROBILINOGEN 1.0 03/09/2013 2028   NITRITE NEGATIVE 03/09/2013 2028   LEUKOCYTESUR NEGATIVE 03/09/2013 2028     Dayla Gasca M.D. [facility] 01/24/2021, 12:55 PM  Available via Epic secure chat 7am-7pm After 7 pm,  please refer to night coverage provider listed on amion.

## 2021-01-24 NOTE — Progress Notes (Signed)
7 Days Post-Op   Subjective/Chief Complaint: In good spirits.  Food does not taste good but trying to increase p.o. intake   Objective: Vital signs in last 24 hours: Temp:  [97.9 F (36.6 C)-98.4 F (36.9 C)] 98.4 F (36.9 C) (03/12 0248) Pulse Rate:  [79-116] 91 (03/11 2140) Resp:  [13-18] 18 (03/11 2140) BP: (92-122)/(44-72) 92/44 (03/12 0248) SpO2:  [93 %-100 %] 100 % (03/12 0248) Last BM Date: 01/23/21  Intake/Output from previous day: 03/11 0701 - 03/12 0700 In: 120 [P.O.:120] Out: 1475 [Stool:1475] Intake/Output this shift: No intake/output data recorded.   PE: Heart: irregular Lungs: CTAB Abd: soft, some erythema surrounding her wound c/w yeast, midline wound otherwise clean, +BS, ileostomy with some soft formed stool present at os and liquid in the bag.  Stoma is pink and viable   Lab Results:  Recent Labs    01/22/21 0300 01/24/21 0418  WBC 14.9* 13.6*  HGB 8.9* 8.7*  HCT 27.6* 27.4*  PLT 639* 761*   BMET Recent Labs    01/22/21 0300  NA 136  K 3.8  CL 94*  CO2 36*  GLUCOSE 107*  BUN 26*  CREATININE 0.82  CALCIUM 7.8*   PT/INR No results for input(s): LABPROT, INR in the last 72 hours. ABG No results for input(s): PHART, HCO3 in the last 72 hours.  Invalid input(s): PCO2, PO2  Studies/Results: No results found.  Anti-infectives: Anti-infectives (From admission, onward)   Start     Dose/Rate Route Frequency Ordered Stop   01/15/21 1800  ciprofloxacin (CIPRO) IVPB 400 mg  Status:  Discontinued        400 mg 200 mL/hr over 60 Minutes Intravenous Every 12 hours 01/15/21 1609 01/19/21 0817   01/15/21 1700  metroNIDAZOLE (FLAGYL) IVPB 500 mg  Status:  Discontinued        500 mg 100 mL/hr over 60 Minutes Intravenous Every 8 hours 01/15/21 1609 01/19/21 0817   01/15/21 0800  cefoTEtan (CEFOTAN) 2 g in sodium chloride 0.9 % 100 mL IVPB        2 g 200 mL/hr over 30 Minutes Intravenous On call to O.R. 01/15/21 0705 01/15/21 1145       Assessment/Plan: Severe aortic stenosis AVA 0.84 cm/EF 60 to 82%/XHBZJ 2 diastolic dysfunction(ECHO 01/15/2021) COPD-ongoing tobacco use/59-pack-year history Acute respiratory failure with hypoxia/fluid overload Hypertension/hypotension Hx collagenous colitis-budesonidepreadmit Anemia - transfusedx3, now stable Hyperlipidemia Moderate malnutrition/deconditioning -prealbumin 10.7 Hyperglycemia -Hemoglobin A1c 7.4   Ischemic colitis; ischemic mass at the splenic flexure/dense intra-abdominal adhesions S/P colonoscopy, cold snare polypectomy and AVM ablation 01/10/2021; S/P angio embolization for GI bleed 01/12/2021 Exploratory laparotomy with partial colectomy and colostomy 01/15/2021 Dr. Erroll Luna. POD #8 Exploratory laparotomy-completion colectomy/ileostomy 01/17/2021 Dr. Ralene Ok, POD #6   -cont PT/OT who recommend [facility] currently.  Patient said daughter is available to live with her and help her initially -P.o. intake relatively poor.  Continue to encourage p.o. intake.  Plan will be for discharge Monday with home health nursing as long as her ileostomy output does not get much higher than it is and she is able to maintain enough intake to prevent dehydration   -nystatin ointment to go around her wound for some candida infection noted -cont midline dressing changes -cont soft diet -ileostomy output around 1400 yesterday.  On iron, fiber, will imodium 2mg  BID today.  IRC:VELF dietIV fluids on hold ID: Cefotetan preop; Cipro/Flagyl 3/2-3/7 DVT: SCDs/Lovenox   LOS: 13 days    Turner Daniels MD  01/24/2021

## 2021-01-25 LAB — GLUCOSE, CAPILLARY
Glucose-Capillary: 113 mg/dL — ABNORMAL HIGH (ref 70–99)
Glucose-Capillary: 123 mg/dL — ABNORMAL HIGH (ref 70–99)
Glucose-Capillary: 147 mg/dL — ABNORMAL HIGH (ref 70–99)
Glucose-Capillary: 146 mg/dL — ABNORMAL HIGH (ref 70–99)

## 2021-01-25 MED ORDER — LOPERAMIDE HCL 2 MG PO CAPS: 4.0000 mg | ORAL_CAPSULE | Freq: Four times a day (QID) | ORAL | Status: DC | PRN

## 2021-01-25 MED ORDER — METFORMIN HCL 500 MG PO TABS: 500.0000 mg | ORAL_TABLET | Freq: Every day | ORAL | 11 refills | Status: DC

## 2021-01-25 MED ORDER — METOPROLOL TARTRATE 25 MG PO TABS: 12.5000 mg | ORAL_TABLET | Freq: Two times a day (BID) | ORAL | 1 refills | Status: DC

## 2021-01-25 MED ORDER — METOPROLOL TARTRATE 12.5 MG HALF TABLET
12.5000 mg | ORAL_TABLET | Freq: Two times a day (BID) | ORAL | Status: DC
  Administered 2021-01-25 – 2021-01-29 (×8): 12.5 mg via ORAL
  Filled 2021-01-25 (×8): qty 1

## 2021-01-25 NOTE — Progress Notes (Signed)
8 Days Post-Op   Subjective/Chief Complaint: Patient in good spirits sitting up in bed.  Ostomy output was up over 2 L yesterday.   Objective: Vital signs in last 24 hours: Temp:  [98.4 F (36.9 C)-99 F (37.2 C)] 98.9 F (37.2 C) (03/13 0531) Pulse Rate:  [76-83] 82 (03/13 0531) Resp:  [14-20] 20 (03/13 0531) BP: (90-103)/(44-58) 103/55 (03/13 0531) SpO2:  [95 %-99 %] 95 % (03/13 0531) Weight:  [53 kg] 53 kg (03/13 0531) Last BM Date: 01/24/21  Intake/Output from previous day: 03/12 0701 - 03/13 0700 In: 360 [P.O.:360] Out: 2450 [Stool:2450] Intake/Output this shift: No intake/output data recorded.   Heart: irregular Lungs: CTAB Abd: soft, some erythema surrounding her wound c/w yeast, midline wound otherwise clean, +BS, ileostomy with some soft formed stool present at os and liquid in the bag. Stoma is pink and viable Lab Results:  Recent Labs    01/24/21 0418  WBC 13.6*  HGB 8.7*  HCT 27.4*  PLT 761*   BMET No results for input(s): NA, K, CL, CO2, GLUCOSE, BUN, CREATININE, CALCIUM in the last 72 hours. PT/INR No results for input(s): LABPROT, INR in the last 72 hours. ABG No results for input(s): PHART, HCO3 in the last 72 hours.  Invalid input(s): PCO2, PO2  Studies/Results: No results found.  Anti-infectives: Anti-infectives (From admission, onward)   Start     Dose/Rate Route Frequency Ordered Stop   01/15/21 1800  ciprofloxacin (CIPRO) IVPB 400 mg  Status:  Discontinued        400 mg 200 mL/hr over 60 Minutes Intravenous Every 12 hours 01/15/21 1609 01/19/21 0817   01/15/21 1700  metroNIDAZOLE (FLAGYL) IVPB 500 mg  Status:  Discontinued        500 mg 100 mL/hr over 60 Minutes Intravenous Every 8 hours 01/15/21 1609 01/19/21 0817   01/15/21 0800  cefoTEtan (CEFOTAN) 2 g in sodium chloride 0.9 % 100 mL IVPB        2 g 200 mL/hr over 30 Minutes Intravenous On call to O.R. 01/15/21 0705 01/15/21 1145      Assessment/Plan: Severe aortic stenosis  AVA 0.84 cm/EF 60 to 23%/RAQTM 2 diastolic dysfunction(ECHO 01/15/2021) COPD-ongoing tobacco use/59-pack-year history Acute respiratory failure with hypoxia/fluid overload Hypertension/hypotension Hx collagenous colitis-budesonidepreadmit Anemia - transfusedx3, now stable Hyperlipidemia Moderate malnutrition/deconditioning -prealbumin 10.7 Hyperglycemia -Hemoglobin A1c 7.4   Ischemic colitis; ischemic mass at the splenic flexure/dense intra-abdominal adhesions S/P colonoscopy, cold snare polypectomy and AVM ablation 01/10/2021; S/P angio embolization for GI bleed 01/12/2021 Exploratory laparotomy with partial colectomy and colostomy 01/15/2021 Dr. Erroll Luna. POD #8 Exploratory laparotomy-completion colectomy/ileostomy 01/17/2021 Dr. Ralene Ok, POD #6   -cont PT/OT who recommend [facility] currently. Patient said daughter is available to live with her and help her initially -P.o. intake slowly improving.  Continue to encourage p.o. intake.  Plan will be for discharge Monday with home health nursing as long as her ileostomy output does not get much higher than it is and she is able to maintain enough intake to prevent dehydration   -nystatin ointment to go around her wound for some candida infection noted -cont midline dressing changes -cont soft diet -ileostomy output around 2100  yesterday. On iron, fiber, will imodium 2mg  BID today.  AUQ:JFHL dietIV fluids on hold ID: Cefotetan preop; Cipro/Flagyl 3/2-3/7 DVT: SCDs/Lovenox   LOS: 14 days    Turner Daniels MD  01/25/2021

## 2021-01-25 NOTE — Progress Notes (Signed)
[facility] consult progress note                                                                               Patient Demographics  Kathleen Pope, is a 87 y.o. female, DOB - Nov 25, 1932, WLN:[id number]  Admit date - 01/11/2021   Admitting Physician Elwyn Reach, MD  Outpatient Primary MD for the patient is Redmond School, MD  Outpatient specialists:   LOS - 14  days   Medical records reviewed and are as summarized below:    Chief Complaint  Patient presents with  . GI Bleeding       Brief summary   Kathleen Pope is a 87 y.o. 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 Patient presented secondary recurrent GI bleeding. Patient underwent coil embolization which unfortunately appears to have left to ischemic colon requiring partial colectomy. After partial colectomy, patient developed hypotension and brisk GI bleeding requiring total colectomy.    Assessment & Plan   Primary problem Lower GI bleeding with likely diverticular bleed, acute blood loss anemia -Presented with, recently admitted with similar episode. -Status post mesenteric arteriogram with successful coil embolization of left colic artery on 7/40 -Recurrent large-volume episode of hematochezia on 3/1 overnight.  GI was consulted again and performed a flexible sigmoidoscopy on 3/2 which showed colonic ischemia -After partial colectomy, had recurrent GI bleeding.  Colonoscopy at bedside unsuccessful in identifying the lesion secondary to previous blood and was recommended colectomy, performed on 3/5 -Received 7 units packed RBCs for GI bleeding.  H&H stable.   Active problems Colonic ischemia -Seen on endoscopy.  General surgery consulted -Status post ex lap with partial colectomy and end colostomy on 3/3. -NG tube removed, underwent total colectomy with end ileostomy on 3/5 -Management per  surgery   Hypovolemic shock, hemorrhagic shock, hypotension -Secondary to blood loss from GI bleeding, requiring aggressive fluid, blood resuscitation in addition to short-term vasopressor support -BP now stable.  Lasix discontinued.    Acute respiratory failure with hypoxia secondary to fluid overload -Likely due to overload from hyper obesity transfusions, IV fluid.  Net + 10 L with maximum 15 kg weight gain -Patient was placed on a Lasix 40 mg IV twice daily, has severe aortic stenosis, cautious diuresis.  Lasix has been discontinued -On room air, sats 95%   Severe aortic stenosis Seen on Transthoracic Echocardiogram.  -Will need outpatient follow-up with her cardiologist, Dr. Johney Frame, last seen on 12/17/2020   Hyperglycemia -Hemoglobin A1c 7.4 -CBGs range 113-163 -Start Metformin 500 mg daily on discharge, prescription sent  COPD -Currently no wheezing,  -Has history of nicotine use    Essential hypertension -Losartan has been discontinued -Continue low-dose metoprolol 12.5 mg twice daily as tolerated with BP (sent prescription to her pharmacy)   Collagenous colitis -Takes budesonide as an outpatient, currently on prescribed dose of 6 mg daily  Hyperlipidemia -Continue statin  Thrombocytosis -Likely reactive     Protein-calorie malnutrition, severe -Albumin 1.9, recurrent GI bleeding chronic illness.  BMI 21 -Continue nutritional supplements    Code Status: DNR DVT Prophylaxis:  enoxaparin (LOVENOX)  injection 30 mg Start: 01/20/21 1100 Place and maintain sequential compression device Start: 01/15/21 1610 SCDs Start: 01/12/21 0237   Level of Care: Level of care: Telemetry Family Communication: Discussed all imaging results, lab results, explained to the patient    Disposition Plan:     Status is: Inpatient per surgery.  Per patient, she is going home tomorrow.  I will sign off.  Please call me with any questions  Remains inpatient appropriate  because:Inpatient level of care appropriate due to severity of illness   Time Spent in minutes 35 minutes  Procedures:   POST MESENTERIC ARTERIOGRAM AND PERCUTANEOUS EMBOLIZATION (01/12/2021)  EGD/FLEXIBLE SIGMOIDOSCOPY (01/15/2021)  PARTIAL COLECTOMY (01/15/2021)   Antimicrobials:   Anti-infectives (From admission, onward)   Start     Dose/Rate Route Frequency Ordered Stop   01/15/21 1800  ciprofloxacin (CIPRO) IVPB 400 mg  Status:  Discontinued        400 mg 200 mL/hr over 60 Minutes Intravenous Every 12 hours 01/15/21 1609 01/19/21 0817   01/15/21 1700  metroNIDAZOLE (FLAGYL) IVPB 500 mg  Status:  Discontinued        500 mg 100 mL/hr over 60 Minutes Intravenous Every 8 hours 01/15/21 1609 01/19/21 0817   01/15/21 0800  cefoTEtan (CEFOTAN) 2 g in sodium chloride 0.9 % 100 mL IVPB        2 g 200 mL/hr over 30 Minutes Intravenous On call to O.R. 01/15/21 0705 01/15/21 1145         Medications  Scheduled Meds: . acetaminophen  1,000 mg Oral Q6H  . budesonide  6 mg Oral Daily  . calcium carbonate  1,250 mg Oral Daily  . Chlorhexidine Gluconate Cloth  6 each Topical Daily  . enoxaparin (LOVENOX) injection  30 mg Subcutaneous Daily  . feeding supplement  237 mL Oral BID BM  . ferrous sulfate  325 mg Oral BID WC  . insulin aspart  0-15 Units Subcutaneous TID WC  . lip balm  1 application Topical BID  . loperamide  2 mg Oral BID  . metoprolol tartrate  12.5 mg Oral BID  . multivitamin with minerals  1 tablet Oral Daily  . nystatin ointment   Topical BID  . pantoprazole  40 mg Oral BID  . polycarbophil  625 mg Oral BID  . pravastatin  10 mg Oral Daily  . sodium chloride flush  10-40 mL Intracatheter Q12H  . sodium chloride flush  3 mL Intravenous Q12H   Continuous Infusions: . sodium chloride Stopped (01/16/21 0641)  . sodium chloride    . albumin human    . methocarbamol (ROBAXIN) IV     PRN Meds:.sodium chloride, sodium chloride, albumin human, albuterol, alum &  mag hydroxide-simeth, diphenhydrAMINE, lip balm, loperamide, magic mouthwash, methocarbamol (ROBAXIN) IV, methocarbamol, morphine injection, ondansetron **OR** ondansetron (ZOFRAN) IV, oxyCODONE, prochlorperazine, simethicone, sodium chloride flush, sodium chloride flush      Subjective:   Kathleen Pope was seen and examined today.  Pleasant and in high spirits, states she is going home tomorrow.  On room air, no chest pain or shortness of breath.  No nausea vomiting.  Objective:   Vitals:   01/24/21 1406 01/24/21 2011 01/25/21 0531 01/25/21 1100  BP: (!) 101/53 (!) 96/44 (!) 103/55 117/60  Pulse: 78 76 82 79  Resp:  19 20   Temp:  99 F (37.2 C) 98.9 F (37.2 C)   TempSrc:  Oral    SpO2:  98% 95%   Weight:   53  kg   Height:        Intake/Output Summary (Last 24 hours) at 01/25/2021 1243 Last data filed at 01/25/2021 1126 Gross per 24 hour  Intake 360 ml  Output 2500 ml  Net -2140 ml     Wt Readings from Last 3 Encounters:  01/25/21 53 kg  01/08/21 40.4 kg  12/29/20 40.4 kg   Physical Exam  General: Alert and oriented x 3, NAD  Cardiovascular: S1 S2 clear, 3/6 systolic murmur to carotids  Respiratory: CTAB, no wheezing, rales or rhonchi  Gastrointestinal: Soft, dressing intact, ileostomy  Ext: no pedal edema bilaterally  Neuro: no new deficits  Musculoskeletal: No cyanosis, clubbing  Skin: No rashes  Psych: Normal affect and demeanor, alert and oriented x3    Data Reviewed:  I have personally reviewed following labs and imaging studies  Micro Results No results found for this or any previous visit (from the past 240 hour(s)).  Radiology Reports NM GI Blood Loss  Addendum Date: 01/08/2021   ADDENDUM REPORT: 01/08/2021 22:08 ADDENDUM: These results were discussed by telephone at the time of interpretation on 01/08/2021 at 10:07 pm to Dr. Havery Moros, who verbally acknowledged these results. Electronically Signed   By: Anner Crete M.D.   On: 01/08/2021  22:08   Result Date: 01/08/2021 CLINICAL DATA:  87 year old female with lower GI bleed. EXAM: NUCLEAR MEDICINE GASTROINTESTINAL BLEEDING SCAN TECHNIQUE: Sequential abdominal images were obtained following intravenous administration of Tc-71mlabeled red blood cells. RADIOPHARMACEUTICALS:  19.8 mCi Tc-949mertechnetate in-vitro labeled red cells. COMPARISON:  CT abdomen pelvis dated 01/08/2021. FINDINGS: There is physiologic uptake within the liver, kidneys, and cardiac blood pool. Physiologic activity noted in the major vasculature. There is radiotracer activity in the left lower quadrant conforming to the distal descending colon and sigmoid consistent with active GI bleed. Please note active diverticular bleed in the region of the mid descending colon is present on the earlier CT. IMPRESSION: Active GI bleed in the mid to distal descending colon corresponding to the diverticular bleed on the earlier CT. Electronically Signed: By: ArAnner Crete.D. On: 01/08/2021 21:11   CT ABDOMEN PELVIS W CONTRAST  Result Date: 01/08/2021 CLINICAL DATA:  Melena. EXAM: CT ABDOMEN AND PELVIS WITH CONTRAST TECHNIQUE: Multidetector CT imaging of the abdomen and pelvis was performed using the standard protocol following bolus administration of intravenous contrast. CONTRAST:  7567mMNIPAQUE IOHEXOL 300 MG/ML  SOLN COMPARISON:  None. FINDINGS: Lower chest: Minimal left pleural effusion is noted. Bibasilar subsegmental atelectasis is noted. Hepatobiliary: No focal liver abnormality is seen. No gallstones, gallbladder wall thickening, or biliary dilatation. Pancreas: Unremarkable. No pancreatic ductal dilatation or surrounding inflammatory changes. Spleen: Normal in size without focal abnormality. Adrenals/Urinary Tract: Adrenal glands are unremarkable. Kidneys are normal, without renal calculi, focal lesion, or hydronephrosis. Bladder is unremarkable. Stomach/Bowel: The stomach and appendix appear normal. No small bowel  dilatation is noted. Postsurgical changes are seen involving the descending colon. The colon is dilated and fluid-filled. Moderate amount of stool is seen throughout the colon. No definite wall thickening is noted. Vascular/Lymphatic: Aortic atherosclerosis. No enlarged abdominal or pelvic lymph nodes. Reproductive: Uterus and bilateral adnexa are unremarkable. Other: No abdominal wall hernia or abnormality. No abdominopelvic ascites. Musculoskeletal: Grade 1 anterolisthesis of L5-S1 is noted secondary to bilateral L5 spondylolysis. No acute osseous abnormality is noted. IMPRESSION: 1. Minimal left pleural effusion is noted with bibasilar subsegmental atelectasis. 2. Postsurgical changes are seen involving the descending colon. The colon is dilated and fluid-filled. Moderate amount of  stool is seen throughout the colon. No definite wall thickening is noted. 3. Grade 1 anterolisthesis of L5-S1 secondary to bilateral L5 spondylolysis. Aortic Atherosclerosis (ICD10-I70.0). Electronically Signed   By: Marijo Conception M.D.   On: 01/08/2021 14:20   IR Angiogram Visceral Selective  Result Date: 01/12/2021 INDICATION: Recurrent lower GI bleeding. Positive CTA. Please perform mesenteric arteriogram with potential embolization. EXAM: 1. ULTRASOUND GUIDANCE FOR ARTERIAL ACCESS 2. SELECTIVE INFERIOR MESENTERIC ARTERIOGRAM 3. SUB SELECTIVE LEFT COLIC ARTERIOGRAM 4. SUB SELECTIVE DISTAL ARCADE DIVISIONS OF THE LEFT COLIC ARTERY AND PERCUTANEOUS COIL EMBOLIZATION COMPARISON:  CTA abdomen and pelvis - 01/11/2021 Mesenteric arteriogram - 01/09/2021 Nuclear medicine tagged red blood cell bleeding scan - 01/08/2021 CT abdomen and pelvis - 01/08/2021 MEDICATIONS: None ANESTHESIA/SEDATION: Moderate (conscious) sedation was employed during this procedure. A total of Versed 0.5 mg was administered intravenously. Moderate Sedation Time: 60 minutes. The patient's level of consciousness and vital signs were monitored continuously by  radiology nursing throughout the procedure under my direct supervision. CONTRAST:  45 cc Omnipaque 300 FLUOROSCOPY TIME:  19 minutes (856 mGy) COMPLICATIONS: SIR Level A - No therapy, no consequence. Procedure complicated by development of a small hematoma at the right groin access site. PROCEDURE: Informed consent was obtained from the patient following explanation of the procedure, risks, benefits and alternatives. All questions were addressed. A time out was performed prior to the initiation of the procedure. Maximal barrier sterile technique utilized including caps, mask, sterile gowns, sterile gloves, large sterile drape, hand hygiene, and Betadine prep. The right femoral head was marked fluoroscopically. Under sterile conditions and local anesthesia, the right common femoral artery access was performed with a micropuncture needle. Under direct ultrasound guidance, the right common femoral was accessed with a micropuncture kit. An ultrasound image was saved for documentation purposes. This allowed for placement of a 5-French vascular sheath. A limited arteriogram was performed through the side arm of the sheath confirming appropriate access within the right common femoral artery. Over a Bentson wire, a Mickelson catheter was advanced the caudal aspect of the thoracic aorta where was reformed, back bled and flushed. The Mickelson catheter was then utilized to select the inferior mesenteric artery and a selective superior mesenteric arteriogram was performed. With the use of a fathom 14 microwire, a regular Renegade microcatheter was advanced to the level of the left colic artery and a selective left colic arteriogram was performed. The microcatheter was then utilized to select the inferior division of the left colic artery and a sub selective arteriogram was performed. The microcatheter was then advanced to select the distal arcade supplying the ill-defined area of extravasation involving the mid descending  colon. Sub selective injection confirmed appropriate positioning and the vessel was percutaneously coil embolized with two overlapping 2 mm diameter interlock coils. The microcatheter was retracted to the level the left colic artery and a post embolization left colic arteriogram was performed. The microcatheter was then advanced to select the distal arcade felt to potentially be supplying additional collateral supply to the ill-defined area of active extravasation involving the mid descending colon. Sub selective injection confirmed appropriate positioning and the vessel was percutaneously coil embolized with two overlapping 2 mm diameter interlock coils. The microcatheter was then retracted to the to the level pelvic artery and a selective left colic arteriogram was performed. Microcatheter was removed and a post embolization arteriogram was performed at the level of the IMA Images were reviewed and the procedure was terminated. All wires, catheters and sheaths were removed from  the patient. Hemostasis was achieved at the right groin access site with manual compression. Procedure was complicated by development of a small hematoma at the right groin access site. The patient otherwise tolerated the procedure well without immediate post procedural complication. FINDINGS: Selective inferior mesenteric arteriogram demonstrates conventional branching pattern with ill-defined area of extravasation involving the mid descending colon compatible with findings on preceding CTA. Selective left colic arteriogram redemonstrates the ill-defined area of extravasation within the mid descending colon supplied predominantly via the inferior division of the left colic artery with potential collateral supply from the superior division. Sub selective arteriogram of the inferior division the left colic artery confirms supply to the ill-defined area of extravasation and as such the supplying distal arcade was percutaneously coil  embolized. Sub selective arteriogram of the superior division of the left colic artery suggests additional collateral supply to the ill-defined area of extravasation and as such the supplying distal arcade was percutaneously coil embolized. Completion left colic and inferior mesenteric arteriograms are negative for residual areas of extravasation or collateral supply to the mid descending colon. Note, superior mesenteric arteriogram was not performed given exhaustive evaluation on dedicated mesenteric arteriogram performed 3 days prior (01/09/2021). IMPRESSION: Technically successful percutaneous coil embolization of distal arcades arising from both the superior and inferior divisions of the left colic artery supplying the ill-defined area of intraluminal contrast extravasation involving the mid descending colon compatible with the findings seen on preceding CTA. PLAN: - The patient is to remain flat for 4 hours with right leg straight (until at least 0730). - The patient will continue to experience several additional bloody bowel movements and may continue to require additional resuscitation (as the patient was bleeding both before and during the procedure), however ultimately I am hopeful she will stabilize in the coming days. Electronically Signed   By: Sandi Mariscal M.D.   On: 01/12/2021 07:24   IR Angiogram Visceral Selective  Result Date: 01/09/2021 CLINICAL DATA:  GI bleed, localized to proximal descending colon on CTA and scintigraphy EXAM: SELECTIVE VISCERAL ARTERIOGRAPHY; IR ULTRASOUND GUIDANCE VASC ACCESS RIGHT ANESTHESIA/SEDATION: Intravenous Fentanyl 65mg and Versed or 1.554mwere administered as conscious sedation during continuous monitoring of the patient's level of consciousness and physiological / cardiorespiratory status by the radiology RN, with a total moderate sedation time of 41 minutes. MEDICATIONS: Lidocaine 1% subcutaneous CONTRAST:  3043mMNIPAQUE IOHEXOL 300 MG/ML SOLN, 21m49mNIPAQUE  IOHEXOL 300 MG/ML SOLN, 32mL40mIPAQUE IOHEXOL 300 MG/ML SOLN PROCEDURE: The procedure, risks (including but not limited to bleeding, infection, organ damage ), benefits, and alternatives were explained to the patient. Questions regarding the procedure were encouraged and answered. The patient understands and consents to the procedure. Right femoral region prepped and draped in usual sterile fashion. Maximal barrier sterile technique was utilized including caps, mask, sterile gowns, sterile gloves, sterile drape, hand hygiene and skin antiseptic. The right common femoral artery was localized under ultrasound. Under real-time ultrasound guidance, the vessel was accessed with a 21-gauge micropuncture needle, exchanged over a 018 guidewire for a transitional dilator, through which a 035 guidewire was advanced. Over this, a 5 Frenc[place]ular sheath was placed, through which a 5 Fren[place]atheter was advanced and used to selectively catheterize the superior mesenteric artery for selective arteriography in multiple projections. A coaxial Renegade microcatheter with a fathom guidewire was advanced and used for selective arteriography of 2 third order branches in multiple projections. The catheter and sheath were removed and hemostasis achieved with the aid of the Exoseal device  after confirmatory femoral arteriography. The patient tolerated the procedure well. COMPLICATIONS: None immediate FINDINGS: No evidence of active extravasation, early draining vein, AVM, or other lesion to suggest a site or etiology of the patient's GI bleeding. Venous phase confirms patency of the portal venous system. IMPRESSION: 1. Negative superior mesenteric arteriogram. No evidence of active extravasation or other focal lesion to suggest etiology of GI bleed. Electronically Signed   By: Lucrezia Europe M.D.   On: 01/09/2021 09:46   IR Angiogram Selective Each Additional Vessel  Result Date: 01/12/2021 INDICATION: Recurrent lower GI bleeding.  Positive CTA. Please perform mesenteric arteriogram with potential embolization. EXAM: 1. ULTRASOUND GUIDANCE FOR ARTERIAL ACCESS 2. SELECTIVE INFERIOR MESENTERIC ARTERIOGRAM 3. SUB SELECTIVE LEFT COLIC ARTERIOGRAM 4. SUB SELECTIVE DISTAL ARCADE DIVISIONS OF THE LEFT COLIC ARTERY AND PERCUTANEOUS COIL EMBOLIZATION COMPARISON:  CTA abdomen and pelvis - 01/11/2021 Mesenteric arteriogram - 01/09/2021 Nuclear medicine tagged red blood cell bleeding scan - 01/08/2021 CT abdomen and pelvis - 01/08/2021 MEDICATIONS: None ANESTHESIA/SEDATION: Moderate (conscious) sedation was employed during this procedure. A total of Versed 0.5 mg was administered intravenously. Moderate Sedation Time: 60 minutes. The patient's level of consciousness and vital signs were monitored continuously by radiology nursing throughout the procedure under my direct supervision. CONTRAST:  45 cc Omnipaque 300 FLUOROSCOPY TIME:  19 minutes (828 mGy) COMPLICATIONS: SIR Level A - No therapy, no consequence. Procedure complicated by development of a small hematoma at the right groin access site. PROCEDURE: Informed consent was obtained from the patient following explanation of the procedure, risks, benefits and alternatives. All questions were addressed. A time out was performed prior to the initiation of the procedure. Maximal barrier sterile technique utilized including caps, mask, sterile gowns, sterile gloves, large sterile drape, hand hygiene, and Betadine prep. The right femoral head was marked fluoroscopically. Under sterile conditions and local anesthesia, the right common femoral artery access was performed with a micropuncture needle. Under direct ultrasound guidance, the right common femoral was accessed with a micropuncture kit. An ultrasound image was saved for documentation purposes. This allowed for placement of a 5-French vascular sheath. A limited arteriogram was performed through the side arm of the sheath confirming appropriate access  within the right common femoral artery. Over a Bentson wire, a Mickelson catheter was advanced the caudal aspect of the thoracic aorta where was reformed, back bled and flushed. The Mickelson catheter was then utilized to select the inferior mesenteric artery and a selective superior mesenteric arteriogram was performed. With the use of a fathom 14 microwire, a regular Renegade microcatheter was advanced to the level of the left colic artery and a selective left colic arteriogram was performed. The microcatheter was then utilized to select the inferior division of the left colic artery and a sub selective arteriogram was performed. The microcatheter was then advanced to select the distal arcade supplying the ill-defined area of extravasation involving the mid descending colon. Sub selective injection confirmed appropriate positioning and the vessel was percutaneously coil embolized with two overlapping 2 mm diameter interlock coils. The microcatheter was retracted to the level the left colic artery and a post embolization left colic arteriogram was performed. The microcatheter was then advanced to select the distal arcade felt to potentially be supplying additional collateral supply to the ill-defined area of active extravasation involving the mid descending colon. Sub selective injection confirmed appropriate positioning and the vessel was percutaneously coil embolized with two overlapping 2 mm diameter interlock coils. The microcatheter was then retracted to the to the level  pelvic artery and a selective left colic arteriogram was performed. Microcatheter was removed and a post embolization arteriogram was performed at the level of the IMA Images were reviewed and the procedure was terminated. All wires, catheters and sheaths were removed from the patient. Hemostasis was achieved at the right groin access site with manual compression. Procedure was complicated by development of a small hematoma at the right groin  access site. The patient otherwise tolerated the procedure well without immediate post procedural complication. FINDINGS: Selective inferior mesenteric arteriogram demonstrates conventional branching pattern with ill-defined area of extravasation involving the mid descending colon compatible with findings on preceding CTA. Selective left colic arteriogram redemonstrates the ill-defined area of extravasation within the mid descending colon supplied predominantly via the inferior division of the left colic artery with potential collateral supply from the superior division. Sub selective arteriogram of the inferior division the left colic artery confirms supply to the ill-defined area of extravasation and as such the supplying distal arcade was percutaneously coil embolized. Sub selective arteriogram of the superior division of the left colic artery suggests additional collateral supply to the ill-defined area of extravasation and as such the supplying distal arcade was percutaneously coil embolized. Completion left colic and inferior mesenteric arteriograms are negative for residual areas of extravasation or collateral supply to the mid descending colon. Note, superior mesenteric arteriogram was not performed given exhaustive evaluation on dedicated mesenteric arteriogram performed 3 days prior (01/09/2021). IMPRESSION: Technically successful percutaneous coil embolization of distal arcades arising from both the superior and inferior divisions of the left colic artery supplying the ill-defined area of intraluminal contrast extravasation involving the mid descending colon compatible with the findings seen on preceding CTA. PLAN: - The patient is to remain flat for 4 hours with right leg straight (until at least 0730). - The patient will continue to experience several additional bloody bowel movements and may continue to require additional resuscitation (as the patient was bleeding both before and during the procedure),  however ultimately I am hopeful she will stabilize in the coming days. Electronically Signed   By: Sandi Mariscal M.D.   On: 01/12/2021 07:24   IR Angiogram Selective Each Additional Vessel  Result Date: 01/12/2021 INDICATION: Recurrent lower GI bleeding. Positive CTA. Please perform mesenteric arteriogram with potential embolization. EXAM: 1. ULTRASOUND GUIDANCE FOR ARTERIAL ACCESS 2. SELECTIVE INFERIOR MESENTERIC ARTERIOGRAM 3. SUB SELECTIVE LEFT COLIC ARTERIOGRAM 4. SUB SELECTIVE DISTAL ARCADE DIVISIONS OF THE LEFT COLIC ARTERY AND PERCUTANEOUS COIL EMBOLIZATION COMPARISON:  CTA abdomen and pelvis - 01/11/2021 Mesenteric arteriogram - 01/09/2021 Nuclear medicine tagged red blood cell bleeding scan - 01/08/2021 CT abdomen and pelvis - 01/08/2021 MEDICATIONS: None ANESTHESIA/SEDATION: Moderate (conscious) sedation was employed during this procedure. A total of Versed 0.5 mg was administered intravenously. Moderate Sedation Time: 60 minutes. The patient's level of consciousness and vital signs were monitored continuously by radiology nursing throughout the procedure under my direct supervision. CONTRAST:  45 cc Omnipaque 300 FLUOROSCOPY TIME:  19 minutes (888 mGy) COMPLICATIONS: SIR Level A - No therapy, no consequence. Procedure complicated by development of a small hematoma at the right groin access site. PROCEDURE: Informed consent was obtained from the patient following explanation of the procedure, risks, benefits and alternatives. All questions were addressed. A time out was performed prior to the initiation of the procedure. Maximal barrier sterile technique utilized including caps, mask, sterile gowns, sterile gloves, large sterile drape, hand hygiene, and Betadine prep. The right femoral head was marked fluoroscopically. Under sterile conditions and local  anesthesia, the right common femoral artery access was performed with a micropuncture needle. Under direct ultrasound guidance, the right common femoral  was accessed with a micropuncture kit. An ultrasound image was saved for documentation purposes. This allowed for placement of a 5-French vascular sheath. A limited arteriogram was performed through the side arm of the sheath confirming appropriate access within the right common femoral artery. Over a Bentson wire, a Mickelson catheter was advanced the caudal aspect of the thoracic aorta where was reformed, back bled and flushed. The Mickelson catheter was then utilized to select the inferior mesenteric artery and a selective superior mesenteric arteriogram was performed. With the use of a fathom 14 microwire, a regular Renegade microcatheter was advanced to the level of the left colic artery and a selective left colic arteriogram was performed. The microcatheter was then utilized to select the inferior division of the left colic artery and a sub selective arteriogram was performed. The microcatheter was then advanced to select the distal arcade supplying the ill-defined area of extravasation involving the mid descending colon. Sub selective injection confirmed appropriate positioning and the vessel was percutaneously coil embolized with two overlapping 2 mm diameter interlock coils. The microcatheter was retracted to the level the left colic artery and a post embolization left colic arteriogram was performed. The microcatheter was then advanced to select the distal arcade felt to potentially be supplying additional collateral supply to the ill-defined area of active extravasation involving the mid descending colon. Sub selective injection confirmed appropriate positioning and the vessel was percutaneously coil embolized with two overlapping 2 mm diameter interlock coils. The microcatheter was then retracted to the to the level pelvic artery and a selective left colic arteriogram was performed. Microcatheter was removed and a post embolization arteriogram was performed at the level of the IMA Images were reviewed  and the procedure was terminated. All wires, catheters and sheaths were removed from the patient. Hemostasis was achieved at the right groin access site with manual compression. Procedure was complicated by development of a small hematoma at the right groin access site. The patient otherwise tolerated the procedure well without immediate post procedural complication. FINDINGS: Selective inferior mesenteric arteriogram demonstrates conventional branching pattern with ill-defined area of extravasation involving the mid descending colon compatible with findings on preceding CTA. Selective left colic arteriogram redemonstrates the ill-defined area of extravasation within the mid descending colon supplied predominantly via the inferior division of the left colic artery with potential collateral supply from the superior division. Sub selective arteriogram of the inferior division the left colic artery confirms supply to the ill-defined area of extravasation and as such the supplying distal arcade was percutaneously coil embolized. Sub selective arteriogram of the superior division of the left colic artery suggests additional collateral supply to the ill-defined area of extravasation and as such the supplying distal arcade was percutaneously coil embolized. Completion left colic and inferior mesenteric arteriograms are negative for residual areas of extravasation or collateral supply to the mid descending colon. Note, superior mesenteric arteriogram was not performed given exhaustive evaluation on dedicated mesenteric arteriogram performed 3 days prior (01/09/2021). IMPRESSION: Technically successful percutaneous coil embolization of distal arcades arising from both the superior and inferior divisions of the left colic artery supplying the ill-defined area of intraluminal contrast extravasation involving the mid descending colon compatible with the findings seen on preceding CTA. PLAN: - The patient is to remain flat for 4  hours with right leg straight (until at least 0730). - The patient will continue to  experience several additional bloody bowel movements and may continue to require additional resuscitation (as the patient was bleeding both before and during the procedure), however ultimately I am hopeful she will stabilize in the coming days. Electronically Signed   By: Sandi Mariscal M.D.   On: 01/12/2021 07:24   IR Angiogram Selective Each Additional Vessel  Result Date: 01/12/2021 INDICATION: Recurrent lower GI bleeding. Positive CTA. Please perform mesenteric arteriogram with potential embolization. EXAM: 1. ULTRASOUND GUIDANCE FOR ARTERIAL ACCESS 2. SELECTIVE INFERIOR MESENTERIC ARTERIOGRAM 3. SUB SELECTIVE LEFT COLIC ARTERIOGRAM 4. SUB SELECTIVE DISTAL ARCADE DIVISIONS OF THE LEFT COLIC ARTERY AND PERCUTANEOUS COIL EMBOLIZATION COMPARISON:  CTA abdomen and pelvis - 01/11/2021 Mesenteric arteriogram - 01/09/2021 Nuclear medicine tagged red blood cell bleeding scan - 01/08/2021 CT abdomen and pelvis - 01/08/2021 MEDICATIONS: None ANESTHESIA/SEDATION: Moderate (conscious) sedation was employed during this procedure. A total of Versed 0.5 mg was administered intravenously. Moderate Sedation Time: 60 minutes. The patient's level of consciousness and vital signs were monitored continuously by radiology nursing throughout the procedure under my direct supervision. CONTRAST:  45 cc Omnipaque 300 FLUOROSCOPY TIME:  19 minutes (536 mGy) COMPLICATIONS: SIR Level A - No therapy, no consequence. Procedure complicated by development of a small hematoma at the right groin access site. PROCEDURE: Informed consent was obtained from the patient following explanation of the procedure, risks, benefits and alternatives. All questions were addressed. A time out was performed prior to the initiation of the procedure. Maximal barrier sterile technique utilized including caps, mask, sterile gowns, sterile gloves, large sterile drape, hand hygiene,  and Betadine prep. The right femoral head was marked fluoroscopically. Under sterile conditions and local anesthesia, the right common femoral artery access was performed with a micropuncture needle. Under direct ultrasound guidance, the right common femoral was accessed with a micropuncture kit. An ultrasound image was saved for documentation purposes. This allowed for placement of a 5-French vascular sheath. A limited arteriogram was performed through the side arm of the sheath confirming appropriate access within the right common femoral artery. Over a Bentson wire, a Mickelson catheter was advanced the caudal aspect of the thoracic aorta where was reformed, back bled and flushed. The Mickelson catheter was then utilized to select the inferior mesenteric artery and a selective superior mesenteric arteriogram was performed. With the use of a fathom 14 microwire, a regular Renegade microcatheter was advanced to the level of the left colic artery and a selective left colic arteriogram was performed. The microcatheter was then utilized to select the inferior division of the left colic artery and a sub selective arteriogram was performed. The microcatheter was then advanced to select the distal arcade supplying the ill-defined area of extravasation involving the mid descending colon. Sub selective injection confirmed appropriate positioning and the vessel was percutaneously coil embolized with two overlapping 2 mm diameter interlock coils. The microcatheter was retracted to the level the left colic artery and a post embolization left colic arteriogram was performed. The microcatheter was then advanced to select the distal arcade felt to potentially be supplying additional collateral supply to the ill-defined area of active extravasation involving the mid descending colon. Sub selective injection confirmed appropriate positioning and the vessel was percutaneously coil embolized with two overlapping 2 mm diameter  interlock coils. The microcatheter was then retracted to the to the level pelvic artery and a selective left colic arteriogram was performed. Microcatheter was removed and a post embolization arteriogram was performed at the level of the IMA Images were reviewed and the  procedure was terminated. All wires, catheters and sheaths were removed from the patient. Hemostasis was achieved at the right groin access site with manual compression. Procedure was complicated by development of a small hematoma at the right groin access site. The patient otherwise tolerated the procedure well without immediate post procedural complication. FINDINGS: Selective inferior mesenteric arteriogram demonstrates conventional branching pattern with ill-defined area of extravasation involving the mid descending colon compatible with findings on preceding CTA. Selective left colic arteriogram redemonstrates the ill-defined area of extravasation within the mid descending colon supplied predominantly via the inferior division of the left colic artery with potential collateral supply from the superior division. Sub selective arteriogram of the inferior division the left colic artery confirms supply to the ill-defined area of extravasation and as such the supplying distal arcade was percutaneously coil embolized. Sub selective arteriogram of the superior division of the left colic artery suggests additional collateral supply to the ill-defined area of extravasation and as such the supplying distal arcade was percutaneously coil embolized. Completion left colic and inferior mesenteric arteriograms are negative for residual areas of extravasation or collateral supply to the mid descending colon. Note, superior mesenteric arteriogram was not performed given exhaustive evaluation on dedicated mesenteric arteriogram performed 3 days prior (01/09/2021). IMPRESSION: Technically successful percutaneous coil embolization of distal arcades arising from  both the superior and inferior divisions of the left colic artery supplying the ill-defined area of intraluminal contrast extravasation involving the mid descending colon compatible with the findings seen on preceding CTA. PLAN: - The patient is to remain flat for 4 hours with right leg straight (until at least 0730). - The patient will continue to experience several additional bloody bowel movements and may continue to require additional resuscitation (as the patient was bleeding both before and during the procedure), however ultimately I am hopeful she will stabilize in the coming days. Electronically Signed   By: Sandi Mariscal M.D.   On: 01/12/2021 07:24   IR US Guide Vasc Access Right  Result Date: 01/12/2021 INDICATION: Recurrent lower GI bleeding. Positive CTA. Please perform mesenteric arteriogram with potential embolization. EXAM: 1. ULTRASOUND GUIDANCE FOR ARTERIAL ACCESS 2. SELECTIVE INFERIOR MESENTERIC ARTERIOGRAM 3. SUB SELECTIVE LEFT COLIC ARTERIOGRAM 4. SUB SELECTIVE DISTAL ARCADE DIVISIONS OF THE LEFT COLIC ARTERY AND PERCUTANEOUS COIL EMBOLIZATION COMPARISON:  CTA abdomen and pelvis - 01/11/2021 Mesenteric arteriogram - 01/09/2021 Nuclear medicine tagged red blood cell bleeding scan - 01/08/2021 CT abdomen and pelvis - 01/08/2021 MEDICATIONS: None ANESTHESIA/SEDATION: Moderate (conscious) sedation was employed during this procedure. A total of Versed 0.5 mg was administered intravenously. Moderate Sedation Time: 60 minutes. The patient's level of consciousness and vital signs were monitored continuously by radiology nursing throughout the procedure under my direct supervision. CONTRAST:  45 cc Omnipaque 300 FLUOROSCOPY TIME:  19 minutes (170 mGy) COMPLICATIONS: SIR Level A - No therapy, no consequence. Procedure complicated by development of a small hematoma at the right groin access site. PROCEDURE: Informed consent was obtained from the patient following explanation of the procedure, risks,  benefits and alternatives. All questions were addressed. A time out was performed prior to the initiation of the procedure. Maximal barrier sterile technique utilized including caps, mask, sterile gowns, sterile gloves, large sterile drape, hand hygiene, and Betadine prep. The right femoral head was marked fluoroscopically. Under sterile conditions and local anesthesia, the right common femoral artery access was performed with a micropuncture needle. Under direct ultrasound guidance, the right common femoral was accessed with a micropuncture kit. An ultrasound image was  saved for documentation purposes. This allowed for placement of a 5-French vascular sheath. A limited arteriogram was performed through the side arm of the sheath confirming appropriate access within the right common femoral artery. Over a Bentson wire, a Mickelson catheter was advanced the caudal aspect of the thoracic aorta where was reformed, back bled and flushed. The Mickelson catheter was then utilized to select the inferior mesenteric artery and a selective superior mesenteric arteriogram was performed. With the use of a fathom 14 microwire, a regular Renegade microcatheter was advanced to the level of the left colic artery and a selective left colic arteriogram was performed. The microcatheter was then utilized to select the inferior division of the left colic artery and a sub selective arteriogram was performed. The microcatheter was then advanced to select the distal arcade supplying the ill-defined area of extravasation involving the mid descending colon. Sub selective injection confirmed appropriate positioning and the vessel was percutaneously coil embolized with two overlapping 2 mm diameter interlock coils. The microcatheter was retracted to the level the left colic artery and a post embolization left colic arteriogram was performed. The microcatheter was then advanced to select the distal arcade felt to potentially be supplying  additional collateral supply to the ill-defined area of active extravasation involving the mid descending colon. Sub selective injection confirmed appropriate positioning and the vessel was percutaneously coil embolized with two overlapping 2 mm diameter interlock coils. The microcatheter was then retracted to the to the level pelvic artery and a selective left colic arteriogram was performed. Microcatheter was removed and a post embolization arteriogram was performed at the level of the IMA Images were reviewed and the procedure was terminated. All wires, catheters and sheaths were removed from the patient. Hemostasis was achieved at the right groin access site with manual compression. Procedure was complicated by development of a small hematoma at the right groin access site. The patient otherwise tolerated the procedure well without immediate post procedural complication. FINDINGS: Selective inferior mesenteric arteriogram demonstrates conventional branching pattern with ill-defined area of extravasation involving the mid descending colon compatible with findings on preceding CTA. Selective left colic arteriogram redemonstrates the ill-defined area of extravasation within the mid descending colon supplied predominantly via the inferior division of the left colic artery with potential collateral supply from the superior division. Sub selective arteriogram of the inferior division the left colic artery confirms supply to the ill-defined area of extravasation and as such the supplying distal arcade was percutaneously coil embolized. Sub selective arteriogram of the superior division of the left colic artery suggests additional collateral supply to the ill-defined area of extravasation and as such the supplying distal arcade was percutaneously coil embolized. Completion left colic and inferior mesenteric arteriograms are negative for residual areas of extravasation or collateral supply to the mid descending colon.  Note, superior mesenteric arteriogram was not performed given exhaustive evaluation on dedicated mesenteric arteriogram performed 3 days prior (01/09/2021). IMPRESSION: Technically successful percutaneous coil embolization of distal arcades arising from both the superior and inferior divisions of the left colic artery supplying the ill-defined area of intraluminal contrast extravasation involving the mid descending colon compatible with the findings seen on preceding CTA. PLAN: - The patient is to remain flat for 4 hours with right leg straight (until at least 0730). - The patient will continue to experience several additional bloody bowel movements and may continue to require additional resuscitation (as the patient was bleeding both before and during the procedure), however ultimately I am hopeful she will stabilize  in the coming days. Electronically Signed   By: Sandi Mariscal M.D.   On: 01/12/2021 07:24   IR US Guide Vasc Access Right  Result Date: 01/09/2021 CLINICAL DATA:  GI bleed, localized to proximal descending colon on CTA and scintigraphy EXAM: SELECTIVE VISCERAL ARTERIOGRAPHY; IR ULTRASOUND GUIDANCE VASC ACCESS RIGHT ANESTHESIA/SEDATION: Intravenous Fentanyl 86mg and Versed or 1.591mwere administered as conscious sedation during continuous monitoring of the patient's level of consciousness and physiological / cardiorespiratory status by the radiology RN, with a total moderate sedation time of 41 minutes. MEDICATIONS: Lidocaine 1% subcutaneous CONTRAST:  3067mMNIPAQUE IOHEXOL 300 MG/ML SOLN, 90m27mNIPAQUE IOHEXOL 300 MG/ML SOLN, 32mL15mIPAQUE IOHEXOL 300 MG/ML SOLN PROCEDURE: The procedure, risks (including but not limited to bleeding, infection, organ damage ), benefits, and alternatives were explained to the patient. Questions regarding the procedure were encouraged and answered. The patient understands and consents to the procedure. Right femoral region prepped and draped in usual sterile  fashion. Maximal barrier sterile technique was utilized including caps, mask, sterile gowns, sterile gloves, sterile drape, hand hygiene and skin antiseptic. The right common femoral artery was localized under ultrasound. Under real-time ultrasound guidance, the vessel was accessed with a 21-gauge micropuncture needle, exchanged over a 018 guidewire for a transitional dilator, through which a 035 guidewire was advanced. Over this, a 5 [place]renc[place]ular sheath was placed, through which a 5 Fren[place]atheter was advanced and used to selectively catheterize the superior mesenteric artery for selective arteriography in multiple projections. A coaxial Renegade microcatheter with a fathom guidewire was advanced and used for selective arteriography of 2 third order branches in multiple projections. The catheter and sheath were removed and hemostasis achieved with the aid of the Exoseal device after confirmatory femoral arteriography. The patient tolerated the procedure well. COMPLICATIONS: None immediate FINDINGS: No evidence of active extravasation, early draining vein, AVM, or other lesion to suggest a site or etiology of the patient's GI bleeding. Venous phase confirms patency of the portal venous system. IMPRESSION: 1. Negative superior mesenteric arteriogram. No evidence of active extravasation or other focal lesion to suggest etiology of GI bleed. Electronically Signed   By: D  HaLucrezia Europe   On: 01/09/2021 09:46   DG CHEST PORT 1 VIEW  Result Date: 01/17/2021 CLINICAL DATA:  Recurrent bleed.  Shortness of breath. EXAM: PORTABLE CHEST 1 VIEW COMPARISON:  January 16, 2021 FINDINGS: Diffuse increased hazy and interstitial opacities with bilateral small effusions, unchanged. A left central line terminates in the central SVC. The cardiomediastinal silhouette is unchanged. No pneumothorax. IMPRESSION: The pulmonary opacities and small effusions are most consistent with pulmonary edema/volume overload with mild  cardiomegaly. An atypical infection is not completely excluded. Recommend clinical correlation. Electronically Signed   By: DavidDorise BullionM.D   On: 01/17/2021 15:58   DG CHEST PORT 1 VIEW  Result Date: 01/16/2021 CLINICAL DATA:  Central catheter placement EXAM: PORTABLE CHEST 1 VIEW COMPARISON:  CT angio abdomen and pelvis 01/11/2021 FINDINGS: Left subclavian approach central venous catheter tip terminates in the mid SVC. Telemetry leads and external support devices overlie the chest. Low lung volumes and atelectasis. Layering bilateral effusions. Some patchy and streaky opacities are present throughout both lungs, right greater than left. No visible pneumothorax. The aorta is calcified. The remaining cardiomediastinal contours are unremarkable. No acute osseous or soft tissue abnormality. IMPRESSION: Left subclavian approach central venous catheter tip terminates in the mid SVC. Developing bilateral effusions. Heterogeneous opacities in the lungs, could reflect developing infection and/or edema.  Electronically Signed   By: Lovena Le M.D.   On: 01/16/2021 21:10   ECHOCARDIOGRAM COMPLETE  Result Date: 01/15/2021    ECHOCARDIOGRAM REPORT   Patient Name:   Kathleen Pope Date of Exam: 01/15/2021 Medical Rec #:  [id number]    Height:       61.5 in Accession #:    [id number]   Weight:       88.0 lb Date of Birth:  10/21/1933     BSA:          1.340 m Patient Age:    13 years     BP:           127/78 mmHg Patient Gender: F            HR:           83 bpm. Exam Location:  Inpatient Procedure: 2D Echo, Color Doppler, Cardiac Doppler and 3D Echo Indications:    Other Cardiac Sounds R01.2; Z01.818 Encounter for other                 preprocedural examination  History:        Patient has prior history of Echocardiogram examinations, most                 recent 08/07/2012. COPD; Risk Factors:Hypertension and                 Dyslipidemia.  Sonographer:    Jonelle Sidle Dance Referring Phys: [id number] ELIZABETH [place]  1. There is severe aortic stenosis with AVA 0.84cm2, mean gradient 54 mmHg, peak gradient 41mHg, Vmax 4.859m, DI 0.27.  2. The aortic valve is calcified. There is severe calcifcation of the aortic valve. There is severe thickening of the aortic valve. Aortic valve regurgitation is mild.  3. Left ventricular ejection fraction, by estimation, is 60 to 65%. The left ventricle has normal function. The left ventricle has no regional wall motion abnormalities.  4. There is severe hypertrophy of the basal septum measuring 1.8cm. The rest of the LV segments demonstrate moderate hypertrophy. An intracavitary gradient is present with no evidence of SAM.  5. Left ventricular diastolic parameters are consistent with Grade II diastolic dysfunction (pseudonormalization).  6. Elevated left atrial pressure.  7. Right ventricular systolic function is normal. The right ventricular size is normal.  8. Left atrial size was moderately dilated.  9. There is moderate thickening of the mitral valve leaflet(s). There is mild calcification of the mitral valve leaflet(s). Mild mitral annular calcification. Mild mitral valve regurgitation. 10. The inferior vena cava is dilated in size with >50% respiratory variability, suggesting right atrial pressure of 8 mmHg. Comparison(s): Compared to echo report in 2013, there is now severe aortic stenosis. FINDINGS  Left Ventricle: Left ventricular ejection fraction, by estimation, is 65 to 70%. The left ventricle has normal function. The left ventricle has no regional wall motion abnormalities. The left ventricular internal cavity size was normal in size. There is  servere hypertrophy of the basal septal segments. The rest of the LV segments demonstrate moderate hypertrophy. Left ventricular diastolic parameters are consistent with Grade II diastolic dysfunction (pseudonormalization). Elevated left atrial pressure. The E/e' is 288Right Ventricle: The right ventricular size is normal. No  increase in right ventricular wall thickness. Right ventricular systolic function is normal. Left Atrium: Left atrial size was moderately dilated. Right Atrium: Right atrial size was normal in size. Pericardium: Trivial pericardial effusion is present. Mitral Valve: The mitral valve is  abnormal. There is moderate thickening of the mitral valve leaflet(s). There is mild calcification of the mitral valve leaflet(s). Mild mitral annular calcification. Mild mitral valve regurgitation. Tricuspid Valve: The tricuspid valve is normal in structure. Tricuspid valve regurgitation is trivial. Aortic Valve: There is severe aortic stenosis with AVA 0.84cm2, mean gradient 54 mmHg, peak gradient 73mHg, Vmax 4.860m, DI 0.27. The aortic valve is calcified. There is severe calcifcation of the aortic valve. There is severe thickening of the aortic valve. Aortic valve regurgitation is mild. Aortic regurgitation PHT measures 373 msec. Pulmonic Valve: The pulmonic valve was normal in structure. Pulmonic valve regurgitation is trivial. Aorta: The aortic root and ascending aorta are structurally normal, with no evidence of dilitation. Venous: The inferior vena cava is dilated in size with greater than 50% respiratory variability, suggesting right atrial pressure of 8 mmHg. IAS/Shunts: No atrial level shunt detected by color flow Doppler.  LEFT VENTRICLE PLAX 2D LVIDd:         3.30 cm  Diastology LVIDs:         2.10 cm  LV e' medial:    5.66 cm/s LV PW:         1.30 cm  LV E/e' medial:  28.6 LV IVS:        1.20 cm  LV e' lateral:   4.35 cm/s LVOT diam:     2.00 cm  LV E/e' lateral: 37.2 LV SV:         83 LV SV Index:   62 LVOT Area:     3.14 cm  RIGHT VENTRICLE             IVC RV Basal diam:  2.30 cm     IVC diam: 2.30 cm RV S prime:     14.10 cm/s TAPSE (M-mode): 1.6 cm LEFT ATRIUM             Index       RIGHT ATRIUM          Index LA diam:        3.70 cm 2.76 cm/m  RA Area:     9.22 cm LA Vol (A2C):   36.5 ml 27.23 ml/m RA Volume:    16.60 ml 12.39 ml/m LA Vol (A4C):   56.0 ml 41.78 ml/m LA Biplane Vol: 46.2 ml 34.47 ml/m  AORTIC VALVE AV Area (Vmax):    0.84 cm AV Area (Vmean):   0.86 cm AV Area (VTI):     0.93 cm AV Vmax:           424.00 cm/s AV Vmean:          307.200 cm/s AV VTI:            0.885 m AV Peak Grad:      71.9 mmHg AV Mean Grad:      43.6 mmHg LVOT Vmax:         114.00 cm/s LVOT Vmean:        84.300 cm/s LVOT VTI:          0.263 m LVOT/AV VTI ratio: 0.30 AI PHT:            373 msec  AORTA Ao Root diam: 3.20 cm Ao Asc diam:  3.10 cm MITRAL VALVE MV Area (PHT): 2.54 cm     SHUNTS MV Decel Time: 299 msec     Systemic VTI:  0.26 m MV E velocity: 162.00 cm/s  Systemic Diam: 2.00 cm MV A velocity: 145.00 cm/s MV E/A  ratio:  1.12 Gwyndolyn Kaufman MD Electronically signed by Gwyndolyn Kaufman MD Signature Date/Time: 01/15/2021/10:43:34 AM    Final    IR EMBO ARTERIAL NOT HEMORR HEMANG INC GUIDE ROADMAPPING  Result Date: 01/12/2021 INDICATION: Recurrent lower GI bleeding. Positive CTA. Please perform mesenteric arteriogram with potential embolization. EXAM: 1. ULTRASOUND GUIDANCE FOR ARTERIAL ACCESS 2. SELECTIVE INFERIOR MESENTERIC ARTERIOGRAM 3. SUB SELECTIVE LEFT COLIC ARTERIOGRAM 4. SUB SELECTIVE DISTAL ARCADE DIVISIONS OF THE LEFT COLIC ARTERY AND PERCUTANEOUS COIL EMBOLIZATION COMPARISON:  CTA abdomen and pelvis - 01/11/2021 Mesenteric arteriogram - 01/09/2021 Nuclear medicine tagged red blood cell bleeding scan - 01/08/2021 CT abdomen and pelvis - 01/08/2021 MEDICATIONS: None ANESTHESIA/SEDATION: Moderate (conscious) sedation was employed during this procedure. A total of Versed 0.5 mg was administered intravenously. Moderate Sedation Time: 60 minutes. The patient's level of consciousness and vital signs were monitored continuously by radiology nursing throughout the procedure under my direct supervision. CONTRAST:  45 cc Omnipaque 300 FLUOROSCOPY TIME:  19 minutes (491 mGy) COMPLICATIONS: SIR Level A - No therapy, no  consequence. Procedure complicated by development of a small hematoma at the right groin access site. PROCEDURE: Informed consent was obtained from the patient following explanation of the procedure, risks, benefits and alternatives. All questions were addressed. A time out was performed prior to the initiation of the procedure. Maximal barrier sterile technique utilized including caps, mask, sterile gowns, sterile gloves, large sterile drape, hand hygiene, and Betadine prep. The right femoral head was marked fluoroscopically. Under sterile conditions and local anesthesia, the right common femoral artery access was performed with a micropuncture needle. Under direct ultrasound guidance, the right common femoral was accessed with a micropuncture kit. An ultrasound image was saved for documentation purposes. This allowed for placement of a 5-French vascular sheath. A limited arteriogram was performed through the side arm of the sheath confirming appropriate access within the right common femoral artery. Over a Bentson wire, a Mickelson catheter was advanced the caudal aspect of the thoracic aorta where was reformed, back bled and flushed. The Mickelson catheter was then utilized to select the inferior mesenteric artery and a selective superior mesenteric arteriogram was performed. With the use of a fathom 14 microwire, a regular Renegade microcatheter was advanced to the level of the left colic artery and a selective left colic arteriogram was performed. The microcatheter was then utilized to select the inferior division of the left colic artery and a sub selective arteriogram was performed. The microcatheter was then advanced to select the distal arcade supplying the ill-defined area of extravasation involving the mid descending colon. Sub selective injection confirmed appropriate positioning and the vessel was percutaneously coil embolized with two overlapping 2 mm diameter interlock coils. The microcatheter was  retracted to the level the left colic artery and a post embolization left colic arteriogram was performed. The microcatheter was then advanced to select the distal arcade felt to potentially be supplying additional collateral supply to the ill-defined area of active extravasation involving the mid descending colon. Sub selective injection confirmed appropriate positioning and the vessel was percutaneously coil embolized with two overlapping 2 mm diameter interlock coils. The microcatheter was then retracted to the to the level pelvic artery and a selective left colic arteriogram was performed. Microcatheter was removed and a post embolization arteriogram was performed at the level of the IMA Images were reviewed and the procedure was terminated. All wires, catheters and sheaths were removed from the patient. Hemostasis was achieved at the right groin access site with manual compression. Procedure  was complicated by development of a small hematoma at the right groin access site. The patient otherwise tolerated the procedure well without immediate post procedural complication. FINDINGS: Selective inferior mesenteric arteriogram demonstrates conventional branching pattern with ill-defined area of extravasation involving the mid descending colon compatible with findings on preceding CTA. Selective left colic arteriogram redemonstrates the ill-defined area of extravasation within the mid descending colon supplied predominantly via the inferior division of the left colic artery with potential collateral supply from the superior division. Sub selective arteriogram of the inferior division the left colic artery confirms supply to the ill-defined area of extravasation and as such the supplying distal arcade was percutaneously coil embolized. Sub selective arteriogram of the superior division of the left colic artery suggests additional collateral supply to the ill-defined area of extravasation and as such the supplying  distal arcade was percutaneously coil embolized. Completion left colic and inferior mesenteric arteriograms are negative for residual areas of extravasation or collateral supply to the mid descending colon. Note, superior mesenteric arteriogram was not performed given exhaustive evaluation on dedicated mesenteric arteriogram performed 3 days prior (01/09/2021). IMPRESSION: Technically successful percutaneous coil embolization of distal arcades arising from both the superior and inferior divisions of the left colic artery supplying the ill-defined area of intraluminal contrast extravasation involving the mid descending colon compatible with the findings seen on preceding CTA. PLAN: - The patient is to remain flat for 4 hours with right leg straight (until at least 0730). - The patient will continue to experience several additional bloody bowel movements and may continue to require additional resuscitation (as the patient was bleeding both before and during the procedure), however ultimately I am hopeful she will stabilize in the coming days. Electronically Signed   By: Sandi Mariscal M.D.   On: 01/12/2021 07:24   CT Angio Abd/Pel W and/or Wo Contrast  Result Date: 01/11/2021 CLINICAL DATA:  Gastrointestinal bleed.  bright red blood. EXAM: CTA ABDOMEN AND PELVIS WITHOUT AND WITH CONTRAST TECHNIQUE: Multidetector CT imaging of the abdomen and pelvis was performed using the standard protocol during bolus administration of intravenous contrast. Multiplanar reconstructed images and MIPs were obtained and reviewed to evaluate the vascular anatomy. CONTRAST:  141m OMNIPAQUE IOHEXOL 350 MG/ML SOLN COMPARISON:  Nuclear medicine gastrointestinal bleeding scan 01/08/2021, CT abdomen pelvis 01/08/2021 FINDINGS: VASCULAR Aorta: Severe atherosclerotic plaque. Normal caliber aorta without aneurysm, dissection, vasculitis or significant stenosis. Celiac: Patent without evidence of aneurysm, dissection, vasculitis or significant  stenosis. SMA: Patent without evidence of aneurysm, dissection, vasculitis or significant stenosis. Renals: Mild atherosclerotic plaque of the origins. Both renal arteries are patent without evidence of aneurysm, dissection, vasculitis, fibromuscular dysplasia or significant stenosis. IMA: Patent without evidence of aneurysm, dissection, vasculitis or significant stenosis. Inflow: Severe atherosclerotic plaque. Patent without evidence of aneurysm, dissection, vasculitis or significant stenosis. Proximal Outflow: At least moderate atherosclerotic plaque. Bilateral common femoral and visualized portions of the superficial and profunda femoral arteries are patent without evidence of aneurysm, dissection, vasculitis or significant stenosis. Veins: The hepatic, portal, splenic, superior mesenteric veins are patent. NON-VASCULAR Lower chest: Interval development of a 1.6 x 1.3 cm right lower lobe pulmonary nodular like consolidation likely represents atelectasis (not visualized on CT 2 days ago. Subsegmental atelectasis within bilateral lower lobes. Trace left pleural effusion. Hepatobiliary: No focal liver abnormality. No gallstones, gallbladder wall thickening, or pericholecystic fluid. No biliary dilatation. Pancreas: No focal lesion. Normal pancreatic contour. No surrounding inflammatory changes. No main pancreatic ductal dilatation. Spleen: Normal in size without focal abnormality. Adrenals/Urinary  Tract: No adrenal nodule bilaterally. Bilateral kidneys enhance symmetrically. Subcentimeter hypodensities within the kidneys are too small to characterize. No hydronephrosis. No hydroureter. The urinary bladder is unremarkable. Stomach/Bowel: Left distal descending colon/sigmoid colon reanastomosis. Layering hyperdensity within the mid to distal descending colon is noted to slightly increase in density on the portal venous phase but is also not visualized on noncontrast study. Associated slightly hyperdense material  within the colonic lumen distally. The colon is noted to be fluid-filled past the splenic flexure. No bowel wall thickening or dilatation. The appendix not definitely identified. Lymphatic: No lymphadenopathy. Reproductive: Status post hysterectomy. No adnexal masses. Other: No intraperitoneal free fluid. No intraperitoneal free gas. No organized fluid collection. Musculoskeletal: No abdominal wall hernia or abnormality. No suspicious lytic or blastic osseous lesions. No acute displaced fracture. Multilevel degenerative changes of the spine. 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 IMPRESSION: VASCULAR Aortic Atherosclerosis (ICD10-I70.0). NON-VASCULAR Mid to distal descending colon gastrointestinal hemorrhage that is located proximal to the colonic anastomosis. Electronically Signed   By: Iven Finn M.D.   On: 01/11/2021 22:36    Lab Data:  CBC: Recent Labs  Lab 01/19/21 0403 01/20/21 0706 01/21/21 0553 01/22/21 0300 01/24/21 0418  WBC 13.3* 13.3* 15.9* 14.9* 13.6*  HGB 8.7* 9.8* 9.7* 8.9* 8.7*  HCT 25.9* 30.5* 30.6* 27.6* 27.4*  MCV 87.5 90.8 90.8 90.5 92.9  PLT 369 504* 686* 639* 154*   Basic Metabolic Panel: Recent Labs  Lab 01/19/21 0403 01/19/21 1346 01/20/21 0706 01/21/21 0553 01/22/21 0300  NA 131*  --  133* 135 136  K 2.9* 4.0 4.6 4.2 3.8  CL 94*  --  95* 94* 94*  CO2 31  --  32 30 36*  GLUCOSE 145*  --  109* 91 107*  BUN 7*  --  10 16 26*  CREATININE 0.61  --  0.66 0.80 0.82  CALCIUM 6.9*  --  7.7* 7.8* 7.8*  MG 1.5*  --  2.2  --   --    GFR: Estimated Creatinine Clearance: 38.8 mL/min (by C-G formula based on SCr of 0.82 mg/dL). Liver Function Tests: Recent Labs  Lab 01/21/21 0553  AST 15  ALT 15  ALKPHOS 46  BILITOT 1.0  PROT 4.5*  ALBUMIN 1.9*   No results for input(s): LIPASE, AMYLASE in the last 168 hours. No results for input(s): AMMONIA in the last 168 hours. Coagulation Profile: No  results for input(s): INR, PROTIME in the last 168 hours. Cardiac Enzymes: No results for input(s): CKTOTAL, CKMB, CKMBINDEX, TROPONINI in the last 168 hours. BNP (last 3 results) No results for input(s): PROBNP in the last 8760 hours. HbA1C: No results for input(s): HGBA1C in the last 72 hours. CBG: Recent Labs  Lab 01/24/21 1134 01/24/21 1614 01/24/21 2012 01/25/21 0745 01/25/21 1123  GLUCAP 153* 140* 163* 113* 123*   Lipid Profile: No results for input(s): CHOL, HDL, LDLCALC, TRIG, CHOLHDL, LDLDIRECT in the last 72 hours. Thyroid Function Tests: No results for input(s): TSH, T4TOTAL, FREET4, T3FREE, THYROIDAB in the last 72 hours. Anemia Panel: No results for input(s): VITAMINB12, FOLATE, FERRITIN, TIBC, IRON, RETICCTPCT in the last 72 hours. Urine analysis:    Component Value Date/Time   COLORURINE YELLOW 03/09/2013 2028   APPEARANCEUR CLEAR 03/09/2013 2028   LABSPEC 1.036 (H) 03/09/2013 2028   PHURINE 6.5 03/09/2013 2028   GLUCOSEU 500 (A) 03/09/2013 2028   HGBUR NEGATIVE 03/09/2013 2028   BILIRUBINUR NEGATIVE 03/09/2013 2028  KETONESUR 15 (A) 03/09/2013 2028   PROTEINUR NEGATIVE 03/09/2013 2028   UROBILINOGEN 1.0 03/09/2013 2028   NITRITE NEGATIVE 03/09/2013 2028   LEUKOCYTESUR NEGATIVE 03/09/2013 2028     Kathleen Pope M.D. [facility] 01/25/2021, 12:43 PM  Available via Epic secure chat 7am-7pm After 7 pm, please refer to night coverage provider listed on amion.

## 2021-01-25 NOTE — TOC Progression Note (Signed)
Transition of Care [facility]) - Progression Note    Patient Details  Name: LATIESHA HARADA MRN: [id number] Date of Birth: [birth year]  Transition of [facility]) CM/SW Contact  Joaquin Courts, RN Phone Number: 01/25/2021, 12:07 PM  Clinical Narrative:    CM spoke with patient re HHPT recommendation. Patient is agreeable to this service.  [facility] rep Corene Cornea given referral.   Expected Discharge Plan: [place] Barriers to Discharge: Continued Medical Work up  Expected Discharge Plan and Services Expected Discharge Plan: [place]   Discharge Planning Services: CM Consult   Living arrangements for the past 2 months: Single Family Home Expected Discharge Date:  (unknown)                         HH Arranged: PT HH Agency: [facility] ([place]) Date [place]: 01/25/21 Time [place]: 1206 Representative spoke with at [place]: [place] ([place]) Interventions    Readmission Risk Interventions Readmission Risk Prevention Plan 01/25/2021  Transportation Screening Complete  HRI or [place] Complete  Social Work Consult for [place] Planning/Counseling [place] Not Applicable  Medication Review [employer]) Complete  Some recent data might be hidden

## 2021-01-26 ENCOUNTER — Other Ambulatory Visit (HOSPITAL_COMMUNITY): Payer: Medicare Other

## 2021-01-26 LAB — CBC
HCT: 30.5 % — ABNORMAL LOW (ref 36.0–46.0)
Hemoglobin: 9.5 g/dL — ABNORMAL LOW (ref 12.0–15.0)
MCH: 30.2 pg (ref 26.0–34.0)
MCHC: 31.1 g/dL (ref 30.0–36.0)
MCV: 96.8 fL (ref 80.0–100.0)
Platelets: 865 10*3/uL — ABNORMAL HIGH (ref 150–400)
RBC: 3.15 MIL/uL — ABNORMAL LOW (ref 3.87–5.11)
RDW: 17 % — ABNORMAL HIGH (ref 11.5–15.5)
WBC: 13.7 10*3/uL — ABNORMAL HIGH (ref 4.0–10.5)
nRBC: 0 % (ref 0.0–0.2)

## 2021-01-26 LAB — COMPREHENSIVE METABOLIC PANEL
ALT: 20 U/L (ref 0–44)
AST: 33 U/L (ref 15–41)
Albumin: 2.3 g/dL — ABNORMAL LOW (ref 3.5–5.0)
Alkaline Phosphatase: 64 U/L (ref 38–126)
Anion gap: 11 (ref 5–15)
BUN: 19 mg/dL (ref 8–23)
CO2: 24 mmol/L (ref 22–32)
Calcium: 8 mg/dL — ABNORMAL LOW (ref 8.9–10.3)
Chloride: 101 mmol/L (ref 98–111)
Creatinine, Ser: 0.64 mg/dL (ref 0.44–1.00)
GFR, Estimated: >60 mL/min (ref >60)
Glucose, Bld: 150 mg/dL — ABNORMAL HIGH (ref 70–99)
Potassium: 3.3 mmol/L — ABNORMAL LOW (ref 3.5–5.1)
Sodium: 136 mmol/L (ref 135–145)
Total Bilirubin: 0.6 mg/dL (ref 0.3–1.2)
Total Protein: 5.2 g/dL — ABNORMAL LOW (ref 6.5–8.1)

## 2021-01-26 LAB — GLUCOSE, CAPILLARY
Glucose-Capillary: 97 mg/dL (ref 70–99)
Glucose-Capillary: 128 mg/dL — ABNORMAL HIGH (ref 70–99)
Glucose-Capillary: 138 mg/dL — ABNORMAL HIGH (ref 70–99)
Glucose-Capillary: 96 mg/dL (ref 70–99)

## 2021-01-26 LAB — MAGNESIUM: Magnesium: 1.9 mg/dL (ref 1.7–2.4)

## 2021-01-26 LAB — PREALBUMIN: Prealbumin: 27.6 mg/dL (ref 18–38)

## 2021-01-26 MED ORDER — FERROUS SULFATE 325 (65 FE) MG PO TABS
325.0000 mg | ORAL_TABLET | Freq: Three times a day (TID) | ORAL | Status: DC
  Administered 2021-01-26 – 2021-01-28 (×5): 325 mg via ORAL
  Filled 2021-01-26 (×6): qty 1

## 2021-01-26 MED ORDER — FLUCONAZOLE 100 MG PO TABS
200.0000 mg | ORAL_TABLET | Freq: Every day | ORAL | Status: DC
  Administered 2021-01-26 – 2021-01-29 (×4): 200 mg via ORAL
  Filled 2021-01-26 (×4): qty 2

## 2021-01-26 MED ORDER — LOPERAMIDE HCL 2 MG PO CAPS
2.0000 mg | ORAL_CAPSULE | Freq: Three times a day (TID) | ORAL | Status: DC
  Administered 2021-01-26: 2 mg via ORAL
  Filled 2021-01-26: qty 1

## 2021-01-26 MED ORDER — POTASSIUM CHLORIDE CRYS ER 20 MEQ PO TBCR
20.0000 meq | EXTENDED_RELEASE_TABLET | Freq: Two times a day (BID) | ORAL | Status: DC
  Administered 2021-01-26 – 2021-01-27 (×2): 20 meq via ORAL
  Filled 2021-01-26 (×2): qty 1

## 2021-01-26 MED ORDER — MAGIC MOUTHWASH W/LIDOCAINE
10.0000 mL | Freq: Four times a day (QID) | ORAL | Status: DC
  Administered 2021-01-26 – 2021-01-27 (×4): 10 mL via ORAL
  Filled 2021-01-26 (×15): qty 10

## 2021-01-26 MED ORDER — MAGIC MOUTHWASH W/LIDOCAINE: 5.0000 mL | Freq: Four times a day (QID) | ORAL | Status: DC

## 2021-01-26 MED ORDER — LOPERAMIDE HCL 2 MG PO CAPS
4.0000 mg | ORAL_CAPSULE | Freq: Three times a day (TID) | ORAL | Status: DC
  Administered 2021-01-26 – 2021-01-28 (×7): 4 mg via ORAL
  Filled 2021-01-26 (×7): qty 2

## 2021-01-26 NOTE — Progress Notes (Signed)
Physical Therapy Treatment Patient Details Name: Kathleen Pope MRN: [id number] DOB: [birth year] Today's Date: 01/26/2021    History of Present Illness Kathleen Pope is a 87 y.o. female admitted on 01/11/21 recently d/c for GI bleed, returns after 1 day due to massive rectal bleed.  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    PT Comments    Pt making good progress.  She wanted to attempt ambulation without RW but demonstrated mild unsteadiness requiring HHA and min A at time.  Recommend continued use of RW - pt agrees.  Continue to advance as able.     Follow Up Recommendations  Home health PT;Supervision - Intermittent     Equipment Recommendations  3in1 (PT)    Recommendations for Other Services       Precautions / Restrictions Precautions Precautions: Fall Precaution Comments: monitor vitals, recent abd sx, colostomy    Mobility  Bed Mobility Overal bed mobility: Needs Assistance Bed Mobility: Supine to Sit     Supine to sit: Min guard;HOB elevated          Transfers Overall transfer level: Needs assistance Equipment used: None Transfers: Sit to/from Stand Sit to Stand: Min guard         General transfer comment: min guard for safety  Ambulation/Gait Ambulation/Gait assistance: Min assist Gait Distance (Feet): 300 Feet Assistive device: 1 person hand held assist Gait Pattern/deviations: Decreased stride length;Step-to pattern Gait velocity: decr   General Gait Details: Pt wanted to try without RW.  She requiried HHA and use of hand rail.  Did have instability at times requiring min A.  Recommended continued use of RW for comfort and balance - pt agreeable   Stairs             Wheelchair Mobility    Modified Rankin (Stroke Patients Only)        Balance Overall balance assessment: Needs assistance Sitting-balance support: Feet supported;No upper extremity supported Sitting balance-Leahy Scale: Good     Standing balance support: Single extremity supported;No upper extremity supported Standing balance-Leahy Scale: Fair Standing balance comment: static stand and donning robe in standing without UE assist; support for ambulation                            Cognition Arousal/Alertness: Awake/alert Behavior During Therapy: WFL for tasks assessed/performed Overall Cognitive Status: Within Functional Limits for tasks assessed                                        Exercises      General Comments General comments (skin integrity, edema, etc.): VSS      Pertinent Vitals/Pain Pain Assessment: Faces Faces Pain Scale: Hurts a little bit Pain Location: abdomen Pain Descriptors / Indicators: Sore;Discomfort Pain Intervention(s): Monitored during session;Repositioned    Home Living                      Prior Function            PT Goals (current goals can now be found in the care plan section) Acute Rehab PT Goals Patient Stated Goal: Regain IND PT Goal Formulation: With patient  Time For Goal Achievement: 01/26/21 Potential to Achieve Goals: Good Progress towards PT goals: Progressing toward goals    Frequency    Min 3X/week      PT Plan Current plan remains appropriate    Co-evaluation              AM-PAC PT "6 Clicks" Mobility   Outcome Measure  Help needed turning from your back to your side while in a flat bed without using bedrails?: A Little Help needed moving from lying on your back to sitting on the side of a flat bed without using bedrails?: A Little Help needed moving to and from a bed to a chair (including a wheelchair)?: A Little Help needed standing up from a chair using your arms (e.g., wheelchair or bedside chair)?: A Little Help needed to walk  in hospital room?: A Little Help needed climbing 3-5 steps with a railing? : A Little 6 Click Score: 18    End of Session   Activity Tolerance: Patient tolerated treatment well Patient left: in chair;with call bell/phone within reach;with chair alarm set Nurse Communication: Mobility status PT Visit Diagnosis: Difficulty in walking, not elsewhere classified (R26.2);Unsteadiness on feet (R26.81)     Time: 4765-4650 PT Time Calculation (min) (ACUTE ONLY): 15 min  Charges:  $Gait Training: 8-22 mins                     Abran Richard, PT Acute Rehab Services Pager [phone] [facility] 01/26/2021, 11:52 AM

## 2021-01-26 NOTE — Progress Notes (Addendum)
9 Days Post-Op    CC: GI bleed  Subjective: Patient seen in bed getting a bath right now.  Her midline incision and prior ostomy site both look fine.  There is a little bit of tissue necrosis midline at the main incision.  Her ostomy bag is full, so I cannot really see the ostomy itself.  Objective: Vital signs in last 24 hours: Temp:  [98.2 F (36.8 C)-98.7 F (37.1 C)] 98.2 F (36.8 C) (03/14 0524) Pulse Rate:  [70-80] 70 (03/14 0524) Resp:  [14-20] 19 (03/14 0524) BP: (100-117)/(57-64) 100/57 (03/14 0524) SpO2:  [74 %-97 %] 97 % (03/14 0524) Last BM Date: 01/25/21 420 p.o. recorded 1650 ileostomy output No urine recorded. Afebrile, vital signs are stable. WBC 13.6, H/H 8.7/27.4, platelets 761,00(3/11) Last BMP 01/22/21  Intake/Output from previous day: 03/13 0701 - 03/14 0700 In: 420 [P.O.:420] Out: 1650 [Stool:1650] Intake/Output this shift: No intake/output data recorded.  General appearance: alert, cooperative and no distress Resp: clear to auscultation bilaterally and She has a loud aortic stenosis murmur. GI: Soft, midline incisions okay.  There is some minimal necrosis in the midline incision.  The prior ostomy site is healing nicely.  The ostomy bag is full. Mouth: She has pretty significant oral thrush. Lab Results:  Recent Labs    01/24/21 0418  WBC 13.6*  HGB 8.7*  HCT 27.4*  PLT 761*    BMET No results for input(s): NA, K, CL, CO2, GLUCOSE, BUN, CREATININE, CALCIUM in the last 72 hours. PT/INR No results for input(s): LABPROT, INR in the last 72 hours.  Recent Labs  Lab 01/21/21 0553  AST 15  ALT 15  ALKPHOS 46  BILITOT 1.0  PROT 4.5*  ALBUMIN 1.9*     Lipase  No results found for: LIPASE   Medications: . acetaminophen  1,000 mg Oral Q6H  . budesonide  6 mg Oral Daily  . calcium carbonate  1,250 mg Oral Daily  . Chlorhexidine Gluconate Cloth  6 each Topical Daily  . enoxaparin (LOVENOX) injection  30 mg Subcutaneous Daily  . feeding  supplement  237 mL Oral BID BM  . ferrous sulfate  325 mg Oral BID WC  . insulin aspart  0-15 Units Subcutaneous TID WC  . lip balm  1 application Topical BID  . loperamide  2 mg Oral BID  . metoprolol tartrate  12.5 mg Oral BID  . multivitamin with minerals  1 tablet Oral Daily  . nystatin ointment   Topical BID  . pantoprazole  40 mg Oral BID  . polycarbophil  625 mg Oral BID  . pravastatin  10 mg Oral Daily  . sodium chloride flush  10-40 mL Intracatheter Q12H  . sodium chloride flush  3 mL Intravenous Q12H    Assessment/Plan Severe aortic stenosis AVA 0.84 cm/EF 60 to 94%/WHQPR 2 diastolic dysfunction(ECHO 01/15/2021) -Weight: 39.9>>55>>52.5 kg>>53.8kg>>>>53kg COPD-ongoing tobacco use/59-pack-year history Acute respiratory failure with hypoxia/fluid overload Hypertension/hypotension Hx collagenous colitis-budesonidepreadmit Anemia - transfusedx3  - H/H 9.9/29.7>>8.7/25.9>>9.7/30.6>>8.9/27.6>>8.7/27.4 Hyperlipidemia Moderate malnutrition/deconditioning -prealbumin 10.7>>27.6(3/14) Hyperglycemia -Hemoglobin A1c 7.4   Ischemic colitis; ischemic mass at the splenic flexure/dense intra-abdominal adhesions S/P colonoscopy, cold snare polypectomy and AVM ablation 01/10/2021; S/P angio embolization for GI bleed 01/12/2021 Exploratory laparotomy with partial colectomy and colostomy 01/15/2021 Dr. Erroll Luna. POD #11 Exploratory laparotomy-completion colectomy/ileostomy 01/17/2021 Dr. Ralene Ok, POD #9 Ileostomy 1650 cc yesterday; FESO4 x 2; Imodium 2 mg x 2; fibercon x 2  FFM:BWGY dietIV fluids on hold ID: Cefotetan preop; Cipro/Flagyl 3/2-3/7 -  WBC 17.0(3/2)>>13.1>>15.0>>16.9>>13.3>>15.9(3/9)>>14.9 >> 13.7 DVT: SCDs/Lovenox  Plan: Increase her Imodium, update her labs today, continue to work on p.o. intake.  Regular diet, Magic mouthwash with lidocaine for her thrush.  I do not think she can go home while her p.o. intake/ileostomy output are so  divergent.   Labs: Potassium 3.3, glucose 150, calcium 8.0, albumin 2.3,  H/H 9.5/30.5, WBC 13.7, platelets 865,000.    LOS: 15 days    Kathleen Pope,Kathleen Pope 01/26/2021 Please see Amion

## 2021-01-26 NOTE — Consult Note (Addendum)
[place] Nurse ostomy follow up Pt states she hopes to discharge tomorrow, daughter participated last week during a teaching session and is not present today. Pt states she has been emptying the pouch independently.  Pouch change performed and patient assistaed with the process using a hand held mirror.  Applied barrier ring (# [id number]) and convex pouch [phone]) Stoma type/location: Ileostomy stoma is red and viable, above skin level, 1 1/4 inches.  Peristomal assessment: intact skin surrounding Output: mod amt brown liquid stool Ostomy pouching: 1pc.  Education provided:  Pt assisted with pouch change procedure and is able to open and close velcro without assistance.  Applied barrier ring and convex pouch. Reviewed pouching routines and ordering supplies.  5 sets of barrier rings and convex pouches left at the bedside.  Pt could benefit from home health assistance after discharge.  Enrolled patient in [place] Start Discharge program: Yes, previously Julien Girt MSN, [place], [employer], [place], [place]

## 2021-01-26 NOTE — Consult Note (Signed)
[place] Nurse ostomy follow up Plans to meet with daughter unsuccessful.  Patient states she is not coming today. Pouch is intact and we will change tomorrow.  Patient with high output now and no discharge date known.  Stoma type/location: RMQ ileostomy Stomal assessment/size: 1 3/8" red and moist  High output.  Liquid brown stool in pouch with solid pieces.  Peristomal assessment: pouch intact  Not assessed Treatment options for stomal/peristomal skin: barrier ring and convex pouch.  Output liquid brown stool Ostomy pouching: 1pc.convex with barrier ring.  Education provided: Informed patient we would change pouch tomorrow.  Enrolled patient in [place] Start Discharge program: Yes Will follow.  Domenic Moras MSN, RN, FNP-BC [facility]ON Wound, Ostomy, Continence Nurse Pager [phone]

## 2021-01-26 NOTE — Care Management Important Message (Signed)
Important Message  Patient Details IM L:etter given to the Patient. Name: NORVA BOWE MRN: [id number] Date of Birth: [birth year]   Medicare Important Message Given:  Yes     [name] 01/26/2021, 12:12 PM

## 2021-01-27 DIAGNOSIS — E871 Hypo-osmolality and hyponatremia: Secondary | ICD-10-CM

## 2021-01-27 DIAGNOSIS — E876 Hypokalemia: Secondary | ICD-10-CM

## 2021-01-27 LAB — BASIC METABOLIC PANEL
Anion gap: 8 (ref 5–15)
BUN: 20 mg/dL (ref 8–23)
CO2: 29 mmol/L (ref 22–32)
Calcium: 8.1 mg/dL — ABNORMAL LOW (ref 8.9–10.3)
Chloride: 97 mmol/L — ABNORMAL LOW (ref 98–111)
Creatinine, Ser: 0.69 mg/dL (ref 0.44–1.00)
GFR, Estimated: >60 mL/min (ref >60)
Glucose, Bld: 127 mg/dL — ABNORMAL HIGH (ref 70–99)
Potassium: 5.1 mmol/L (ref 3.5–5.1)
Sodium: 134 mmol/L — ABNORMAL LOW (ref 135–145)

## 2021-01-27 LAB — MAGNESIUM: Magnesium: 1.9 mg/dL (ref 1.7–2.4)

## 2021-01-27 LAB — GLUCOSE, CAPILLARY
Glucose-Capillary: 109 mg/dL — ABNORMAL HIGH (ref 70–99)
Glucose-Capillary: 131 mg/dL — ABNORMAL HIGH (ref 70–99)
Glucose-Capillary: 156 mg/dL — ABNORMAL HIGH (ref 70–99)
Glucose-Capillary: 151 mg/dL — ABNORMAL HIGH (ref 70–99)

## 2021-01-27 MED ORDER — POTASSIUM CHLORIDE CRYS ER 20 MEQ PO TBCR
20.0000 meq | EXTENDED_RELEASE_TABLET | Freq: Every day | ORAL | Status: DC
  Administered 2021-01-28 – 2021-01-29 (×2): 20 meq via ORAL
  Filled 2021-01-27 (×2): qty 1

## 2021-01-27 NOTE — Progress Notes (Signed)
Nutrition Follow-up  DOCUMENTATION CODES:   Underweight,Severe malnutrition in context of chronic illness  INTERVENTION:   -Ensure Surgery po BID, each supplement provides 330 kcal and 18 grams of protein  -Multivitamin with minerals daily  NUTRITION DIAGNOSIS:   Severe Malnutrition related to chronic illness (recurrent GI bleeds) as evidenced by severe muscle depletion,energy intake < or equal to 75% for > or equal to 1 month,severe fat depletion.  Ongoing  GOAL:   Patient will meet greater than or equal to 90% of their needs  Progressing.  MONITOR:   PO intake,Supplement acceptance,Labs,Weight trends,I & O's  ASSESSMENT:   87 y.o. 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 Patient presented secondary recurrent GI bleeding.  3/2: s/p EGD, flex sig 3/3: s/p partial colectomy, colostomy 3/4: s/p colonoscopy 3/5: s/p completion of colectomy, ileostomy  Patient being treated for thrush now. Currently consuming 10% of meals today. Over the past 3 days pt has had variable intakes ranging from 0-100%, most likely d/t thrush. Pt is drinking protein supplements.   Admission weight: 87 lbs Current weight: 99 lbs  I/Os: -2.4L since 3/1 UOP: 650 ml x 24 hrs Ostomy: 200 ml  Medications: OSCAL, Ferrous sulfate, Magic mouthwash, Multivitamin with minerals daily, Fibercon ,  Labs reviewed: CBGs: 96-138 Low Na  Diet Order:   Diet Order            Diet regular Room service appropriate? Yes; Fluid consistency: Thin  Diet effective now                 EDUCATION NEEDS:   Education needs have been addressed  Skin:  Skin Assessment: Reviewed RN Assessment  Last BM:  3/8 - ileostomy  Height:   Ht Readings from Last 1 Encounters:  01/14/21 5' 1.5" (1.562 m)    Weight:   Wt Readings from Last 1 Encounters:  01/27/21 45 kg   BMI:  Body mass index is 18.44 kg/m.  Estimated  Nutritional Needs:   Kcal:  1500-1700  Protein:  70-85g  Fluid:  1.7L/day  Clayton Bibles, MS, RD, LDN Inpatient Clinical Dietitian Contact information available via Amion

## 2021-01-27 NOTE — TOC Progression Note (Signed)
Transition of Care [facility]) - Progression Note    Patient Details  Name: Kathleen Pope MRN: [id number] Date of Birth: October 29, 1933  [facility] CM/SW Contact  Ross Ludwig, [place] Phone Number: 01/27/2021, 4:31 PM  Clinical Narrative:    Patient has [place] set up for home health services.  Patient will need orders before discharge.   Expected Discharge Plan: [place] Barriers to Discharge: Continued Medical Work up  Expected Discharge Plan and Services Expected Discharge Plan: [place]   Discharge Planning Services: CM Consult   Living arrangements for the past 2 months: Single Family Home Expected Discharge Date:  (unknown)                         HH Arranged: PT HH Agency: [place] ([place]) Date [place]: 01/25/21 Time [place]: 1206 Representative spoke with at [place]: [place] ([place]) Interventions    Readmission Risk Interventions Readmission Risk Prevention Plan 01/25/2021  Transportation Screening Complete  HRI or [place] Complete  Social Work Consult for [place] Planning/Counseling [place] Not Applicable  Medication Review Press [employer]) Complete  Some recent data might be hidden

## 2021-01-27 NOTE — Progress Notes (Signed)
Occupational Therapy Treatment Patient Details Name: Kathleen Pope MRN: [id number] DOB: 26-Dec-1932 Today's Date: 01/27/2021    History of present illness Kathleen Pope is a 87 y.o. female admitted on 01/11/21 recently d/c for GI bleed, returns after 1 day due to massive rectal bleed.  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   OT comments  Patient met lying supine in bed in agreement with OT treatment session with focus on self-care re-education and patient/family education in prep for safe d/c home. Patient completes shower transfer with Min guard and cues for walker management to [facility] in walk-in shower. UB bathing/dressing at shower level with set-up assist and LB bathing/dressing with Min guard and cues for safety. Abdominal incisions open to warm water and mild soap per RN. RN present to re-dress wounds at conclusion of session. Patient would benefit from continued acute OT services in prep for safe d/c home. Recommendation for HHOT and initial 24hr supervision/assist remains appropriate.    Follow Up Recommendations  Home health OT;Supervision/Assistance - 24 hour    Equipment Recommendations  None recommended by OT    Recommendations for Other Services      Precautions / Restrictions Precautions Precautions: Fall Precaution Comments: monitor vitals, recent abd sx, colostomy Restrictions Weight Bearing Restrictions: No       Mobility Bed Mobility Overal bed mobility: Needs Assistance Bed Mobility: Supine to Sit;Sit to Supine     Supine to sit: Min guard;HOB elevated Sit to supine: Supervision   General bed mobility comments: Min guard to bring trunk upright with use of bed rail. Supervision A sit to supine with increased time 2/2 abdominal discomfort.    Transfers Overall  transfer level: Needs assistance Equipment used: Rolling walker (2 wheeled) Transfers: Sit to/from Stand Sit to Stand: Supervision;Min guard         General transfer comment: Sit to stand x several trials with Min guard to supervision A and cues for hand placement.    Balance Overall balance assessment: Needs assistance Sitting-balance support: Feet supported;No upper extremity supported Sitting balance-Leahy Scale: Good     Standing balance support: Single extremity supported;No upper extremity supported Standing balance-Leahy Scale: Fair                             ADL either performed or assessed with clinical judgement   ADL Overall ADL's : Needs assistance/impaired     Grooming: Set up;Sitting   Upper Body Bathing: Supervision/ safety;Set up;Sitting Upper Body Bathing Details (indicate cue type and reason): Able to bathe UB at shower level seated on BSC. Lower Body Bathing: Sit to/from stand;Sitting/lateral leans;Min guard Lower Body Bathing Details (indicate cue type and reason): Min guard for steadying in standing to wash buttocks. Patient able to bathe BLE and feet seated on BSC at shower level. Upper Body Dressing : Set up;Sitting Upper Body Dressing Details (indicate cue type and reason): Able to doff/don anterior gown with set-up assist. Lower Body Dressing: Min guard;Sit to/from stand Lower Body Dressing Details (indicate cue type and reason): Patient able to don bilateral footwear seated EOB with supervision A. Min guard to hike underwear over hips in standing for safety.             Functional mobility during ADLs: Supervision/safety  Vision       Perception     Praxis      Cognition Arousal/Alertness: Awake/alert Behavior During Therapy: WFL for tasks assessed/performed Overall Cognitive Status: Within Functional Limits for tasks assessed                                          Exercises     Shoulder  Instructions       General Comments      Pertinent Vitals/ Pain       Pain Assessment: No/denies pain Pain Intervention(s): Monitored during session  Home Living                                          Prior Functioning/Environment              Frequency  Min 2X/week        Progress Toward Goals  OT Goals(current goals can now be found in the care plan section)  Progress towards OT goals: Progressing toward goals  Acute Rehab OT Goals Patient Stated Goal: To return home. OT Goal Formulation: With patient Time For Goal Achievement: 02/01/21 Potential to Achieve Goals: Good ADL Goals Pt Will Perform Lower Body Dressing: with supervision;with adaptive equipment;sit to/from stand;sitting/lateral leans Pt Will Transfer to Toilet: with supervision;ambulating;bedside commode Pt Will Perform Toileting - Clothing Manipulation and hygiene: with supervision;sit to/from stand;sitting/lateral leans Additional ADL Goal #1: Patient will tolerate 8 min standing activity in order to participate in self care tasks.  Plan Discharge plan remains appropriate;Frequency remains appropriate    Co-evaluation                 AM-PAC OT "6 Clicks" Daily Activity     Outcome Measure   Help from another person eating meals?: None Help from another person taking care of personal grooming?: A Little Help from another person toileting, which includes using toliet, bedpan, or urinal?: A Little Help from another person bathing (including washing, rinsing, drying)?: A Little Help from another person to put on and taking off regular upper body clothing?: A Little Help from another person to put on and taking off regular lower body clothing?: A Little 6 Click Score: 19    End of Session Equipment Utilized During Treatment: Rolling walker  OT Visit Diagnosis: Pain;Other abnormalities of gait and mobility (R26.89);Muscle weakness (generalized) (M62.81)   Activity  Tolerance     Patient Left in bed;with call bell/phone within reach;with family/visitor present;with nursing/sitter in room (RN changing ostomy bag.)   Nurse Communication          Time: 1158-1226 OT Time Calculation (min): 28 min  Charges: OT General Charges $OT Visit: 1 Visit OT Treatments $Self Care/Home Management : 23-37 mins  Destanae H. OTR/L Supplemental OT, Department of rehab services [phone]   Destanae R H. 01/27/2021, 1:24 PM    

## 2021-01-27 NOTE — Progress Notes (Signed)
Kathleen Pope [id number] [birth year]  CARE TEAM:  PCP: Redmond School, MD  Outpatient Care Team: Patient Care Team: Redmond School, MD as PCP - General (Internal Medicine)  Inpatient Treatment Team: Treatment Team: Attending Provider: Edison Pace Md, MD; Social Worker: Merri Brunette; Consulting Physician: Edison Pace, Md, MD; [place] Nurse: Jeannie Fend, FNP; Registered Nurse: Enrigue Catena, RN; Licensed Practical Nurse: Tiajuana Amass, LPN; Licensed Practical Nurse: Mady Haagensen, LPN; Occupational Therapist: Felipe Drone, OT; Utilization Review: Rolland Porter, RN   Problem List:   Principal Problem:   Ischemic colitis with LGIB s/p colectomies & ileostomy 3/3 & 01/17/2021 Active Problems:   Cigarette smoker   GI hemorrhage at cecal ulcer s/p colectomy/ileostomy 01/17/2021   AVM (arteriovenous malformation) of colon   HTN (hypertension)   Acute blood loss anemia   Hematochezia   Fluid overload   Hemorrhagic shock ([place])   Protein-calorie malnutrition, severe   Ileostomy in place [facility])   High output ileostomy ([place])   10 Days Post-Op    01/17/2021  PRE-OPERATIVE DIAGNOSIS:  GI bleeding  POST-OPERATIVE DIAGNOSIS:  GI bleeding  PROCEDURE:  Procedure(s): EXPLORATORY LAPAROTOMY COMPLETION COLECTOMY ileostomy (N/A)  SURGEON:  Surgeon(s) and Role:    Ralene Ok, MD - Primary   01/15/2021 Preoperative diagnosis: Colonic ischemia involving the distal descending colon and history of colonic GI bleeding  Postop diagnosis: Ischemic appearing mass at the splenic flexure and dense intra-abdominal adhesions  Procedure: Exploratory laparotomy with partial colectomy and end colostomy  Surgeon: Erroll Luna, MD   Assessment  Recovering  [facility] Stay = 16 days)  Assessment/Plan Severe aortic stenosis AVA 0.84 cm/EF 60 to 93%/GHWEX 2 diastolic dysfunction(ECHO 01/15/2021) -Weight: 39.9>>55>>52.5 kg>>53.8kg>>>>53kg COPD-ongoing tobacco  use/59-pack-year history Acute respiratory failure with hypoxia/fluid overload Hypertension/hypotension Hx collagenous colitis-budesonidepreadmit Anemia - transfusedx3 - H/H 9.9/29.7>>8.7/25.9>>9.7/30.6>>8.9/27.6>>8.7/27.4 Hyperlipidemia Moderate malnutrition/deconditioning -prealbumin 10.7>>27.6(3/14) Hyperglycemia -Hemoglobin A1c 7.4   Ischemic colitis; ischemic mass at the splenic flexure/dense intra-abdominal adhesions S/P colonoscopy, cold snare polypectomy and AVM ablation 01/10/2021; S/P angio embolization for GI bleed 01/12/2021 Exploratory laparotomy with partial colectomy and colostomy 01/15/2021 Dr. Erroll Luna. POD #11 Exploratory laparotomy-completion colectomy/ileostomy 01/17/2021 Dr. Ralene Ok, POD #9 Ileostomy ??? Output -  FESO4 x 3; Imodium 4 mg x qid; fibercon x 2    FEN:reg dietIV fluids on hold.  Bowel regimen ID: Cefotetan preop; Cipro/Flagyl 3/2-3/7 - WBC 17.0(3/2)>>13.1>>15.0>>16.9>>13.3>>15.9(3/9)>>14.9 >> 13.7 DVT: SCDs/Lovenox  Plan:  Transfer to floor K elevated - dec BID to qd KCl continue to work on p.o. intake.  Regular diet,  Antidiarrheal regimen STRICT I&O TO ASSESS ILEOSTOMY OUTPUT Fluconazole, Magic mouthwash with lidocaine for her thrush.       Disposition:  Disposition:  The patient is from: Home  Anticipate discharge to:  Home with Home Health  Anticipated Date of Discharge is:  March 16,2022    Barriers to discharge:  Pending Clinical improvement (more likely than not)  Patient currently is NOT MEDICALLY STABLE for discharge from the hospital from a surgery standpoint.      25 minutes spent in review, evaluation, examination, counseling, and coordination of care.   I have reviewed this patient's available data, including medical history, events of note, physical examination and test results as part of my evaluation.  A significant portion of that time was spent in counseling.  Care during the described  time interval was provided by me.  01/27/2021    Subjective: (Chief complaint)  Not much appetite Less pain Walking BID in hallways  Objective:  Vital signs:  Vitals:   01/26/21 1400 01/26/21 1958 01/27/21 0410 01/27/21 0500  BP: 105/61 114/68 127/65   Pulse: 70 80 67   Resp: 18 18 18    Temp: 97.7 F (36.5 C) 99 F (37.2 C) 98.5 F (36.9 C)   TempSrc: Oral Oral Oral   SpO2: 97% 97% 98%   Weight:    45 kg  Height:        Last BM Date: 01/26/21  Intake/Output   Yesterday:  03/14 0701 - 03/15 0700 In: -  Out: 710 [Urine:650; Stool:60] This shift:  No intake/output data recorded.  Bowel function:  Flatus: YES  BM:  YES  Drain: (No drain)   Physical Exam:  General: Pt awake/alert in no acute distress Eyes: PERRL, normal EOM.  Sclera clear.  No icterus Neuro: CN II-XII intact w/o focal sensory/motor deficits. Lymph: No head/neck/groin lymphadenopathy Psych:  No delerium/psychosis/paranoia.  Oriented x 4 HENT: Normocephalic, Mucus membranes moist.  No thrush Neck: Supple, No tracheal deviation.  No obvious thyromegaly Chest: No pain to chest wall compression.  Good respiratory excursion.  No audible wheezing CV:  Pulses intact.  Regular rhythm.  No major extremity edema MS: Normal AROM mjr joints.  No obvious deformity  Abdomen: Soft.  Nondistended.  Mildly tender at incisions only.  Midline wound superficial w granulation.  RLQ ileostomy pink with mildly thickened effluent.  No evidence of peritonitis.  No incarcerated hernias.  Ext:   No deformity.  No mjr edema.  No cyanosis Skin: No petechiae / purpurea.  No major sores.  Warm and dry    Results:   Cultures: Recent Results (from the past 720 hour(s))  Resp Panel by RT-PCR (Flu A&B, Covid) Nasopharyngeal Swab     Status: None   Collection Time: 01/08/21  2:51 PM   Specimen: Nasopharyngeal Swab; Nasopharyngeal(NP) swabs in vial transport medium  Result Value Ref Range Status   SARS Coronavirus 2  by RT PCR NEGATIVE NEGATIVE Final    Comment: (NOTE) SARS-CoV-2 target nucleic acids are NOT DETECTED.  The SARS-CoV-2 RNA is generally detectable in upper respiratory specimens during the acute phase of infection. The lowest concentration of SARS-CoV-2 viral copies this assay can detect is 138 copies/mL. A negative result does not preclude SARS-Cov-2 infection and should not be used as the sole basis for treatment or other patient management decisions. A negative result may occur with  improper specimen collection/handling, submission of specimen other than nasopharyngeal swab, presence of viral mutation(s) within the areas targeted by this assay, and inadequate number of viral copies(<138 copies/mL). A negative result must be combined with clinical observations, patient history, and epidemiological information. The expected result is Negative.  Fact Sheet for Patients:  EntrepreneurPulse.com.au  Fact Sheet for Healthcare Providers:  IncredibleEmployment.be  This test is no t yet approved or cleared by the [place] FDA and  has been authorized for detection and/or diagnosis of SARS-CoV-2 by FDA under an Emergency Use Authorization (EUA). This EUA will remain  in effect (meaning this test can be used) for the duration of the COVID-19 declaration under Section 564(b)(1) of the Act, 21 U.S.C.section 360bbb-3(b)(1), unless the authorization is terminated  or revoked sooner.       Influenza A by PCR NEGATIVE NEGATIVE Final   Influenza B by PCR NEGATIVE NEGATIVE Final    Comment: (NOTE) The Xpert Xpress SARS-CoV-2/FLU/RSV plus assay is intended as an aid in the diagnosis of influenza from Nasopharyngeal swab specimens and should not  be used as a sole basis for treatment. Nasal washings and aspirates are unacceptable for Xpert Xpress SARS-CoV-2/FLU/RSV testing.  Fact Sheet for Patients: EntrepreneurPulse.com.au  Fact  Sheet for Healthcare Providers: IncredibleEmployment.be  This test is not yet approved or cleared by the [place] FDA and has been authorized for detection and/or diagnosis of SARS-CoV-2 by FDA under an Emergency Use Authorization (EUA). This EUA will remain in effect (meaning this test can be used) for the duration of the COVID-19 declaration under Section 564(b)(1) of the Act, 21 U.S.C. section 360bbb-3(b)(1), unless the authorization is terminated or revoked.  Performed at [facility], [place] [address], [place], [place] [address]   Resp Panel by RT-PCR (Flu A&B, Covid) Nasopharyngeal Swab     Status: None   Collection Time: 01/11/21  7:53 PM   Specimen: Nasopharyngeal Swab; Nasopharyngeal(NP) swabs in vial transport medium  Result Value Ref Range Status   SARS Coronavirus 2 by RT PCR NEGATIVE NEGATIVE Final    Comment: (NOTE) SARS-CoV-2 target nucleic acids are NOT DETECTED.  The SARS-CoV-2 RNA is generally detectable in upper respiratory specimens during the acute phase of infection. The lowest concentration of SARS-CoV-2 viral copies this assay can detect is 138 copies/mL. A negative result does not preclude SARS-Cov-2 infection and should not be used as the sole basis for treatment or other patient management decisions. A negative result may occur with  improper specimen collection/handling, submission of specimen other than nasopharyngeal swab, presence of viral mutation(s) within the areas targeted by this assay, and inadequate number of viral copies(<138 copies/mL). A negative result must be combined with clinical observations, patient history, and epidemiological information. The expected result is Negative.  Fact Sheet for Patients:  EntrepreneurPulse.com.au  Fact Sheet for Healthcare Providers:  IncredibleEmployment.be  This test is no t yet approved or cleared by the [place] FDA and   has been authorized for detection and/or diagnosis of SARS-CoV-2 by FDA under an Emergency Use Authorization (EUA). This EUA will remain  in effect (meaning this test can be used) for the duration of the COVID-19 declaration under Section 564(b)(1) of the Act, 21 U.S.C.section 360bbb-3(b)(1), unless the authorization is terminated  or revoked sooner.       Influenza A by PCR NEGATIVE NEGATIVE Final   Influenza B by PCR NEGATIVE NEGATIVE Final    Comment: (NOTE) The Xpert Xpress SARS-CoV-2/FLU/RSV plus assay is intended as an aid in the diagnosis of influenza from Nasopharyngeal swab specimens and should not be used as a sole basis for treatment. Nasal washings and aspirates are unacceptable for Xpert Xpress SARS-CoV-2/FLU/RSV testing.  Fact Sheet for Patients: EntrepreneurPulse.com.au  Fact Sheet for Healthcare Providers: IncredibleEmployment.be  This test is not yet approved or cleared by the [place] FDA and has been authorized for detection and/or diagnosis of SARS-CoV-2 by FDA under an Emergency Use Authorization (EUA). This EUA will remain in effect (meaning this test can be used) for the duration of the COVID-19 declaration under Section 564(b)(1) of the Act, 21 U.S.C. section 360bbb-3(b)(1), unless the authorization is terminated or revoked.  Performed at [facility], [place] [address] [place], [place] [address]   MRSA PCR Screening     Status: None   Collection Time: 01/12/21  1:47 AM   Specimen: Nasopharyngeal  Result Value Ref Range Status   MRSA by PCR NEGATIVE NEGATIVE Final    Comment:        The GeneXpert MRSA Assay (FDA approved for NASAL specimens only), is  one component of a comprehensive MRSA colonization surveillance program. It is not intended to diagnose MRSA infection nor to guide or monitor treatment for MRSA infections. Performed at [facility], [place] [address], [place], [place] [address]     Labs: Results for orders placed or performed during the hospital encounter of 01/11/21 (from the past 48 hour(s))  Glucose, capillary     Status: Abnormal   Collection Time: 01/25/21 11:23 AM  Result Value Ref Range   Glucose-Capillary 123 (H) 70 - 99 mg/dL    Comment: Glucose reference range applies only to samples taken after fasting for at least 8 hours.  Glucose, capillary     Status: Abnormal   Collection Time: 01/25/21  4:11 PM  Result Value Ref Range   Glucose-Capillary 147 (H) 70 - 99 mg/dL    Comment: Glucose reference range applies only to samples taken after fasting for at least 8 hours.  Glucose, capillary     Status: Abnormal   Collection Time: 01/25/21  8:25 PM  Result Value Ref Range   Glucose-Capillary 146 (H) 70 - 99 mg/dL    Comment: Glucose reference range applies only to samples taken after fasting for at least 8 hours.  Glucose, capillary     Status: None   Collection Time: 01/26/21  7:44 AM  Result Value Ref Range   Glucose-Capillary 97 70 - 99 mg/dL    Comment: Glucose reference range applies only to samples taken after fasting for at least 8 hours.  CBC     Status: Abnormal   Collection Time: 01/26/21  9:30 AM  Result Value Ref Range   WBC 13.7 (H) 4.0 - 10.5 K/uL   RBC 3.15 (L) 3.87 - 5.11 MIL/uL   Hemoglobin 9.5 (L) 12.0 - 15.0 g/dL   HCT 30.5 (L) 36.0 - 46.0 %   MCV 96.8 80.0 - 100.0 fL   MCH 30.2 26.0 - 34.0 pg   MCHC 31.1 30.0 - 36.0 g/dL   RDW 17.0 (H) 11.5 - 15.5 %   Platelets 865 (H) 150 - 400 K/uL   nRBC 0.0 0.0 - 0.2 %    Comment: Performed at [facility], [place] [address] [place], [place] [address]  Comprehensive metabolic panel     Status: Abnormal   Collection Time: 01/26/21  9:30 AM  Result Value Ref Range   Sodium 136 135 - 145 mmol/L   Potassium 3.3 (L) 3.5 - 5.1 mmol/L   Chloride 101 98 - 111 mmol/L   CO2 24 22 - 32 mmol/L   Glucose, Bld 150 (H) 70 - 99 mg/dL    Comment: Glucose  reference range applies only to samples taken after fasting for at least 8 hours.   BUN 19 8 - 23 mg/dL   Creatinine, Ser 0.64 0.44 - 1.00 mg/dL   Calcium 8.0 (L) 8.9 - 10.3 mg/dL   Total Protein 5.2 (L) 6.5 - 8.1 g/dL   Albumin 2.3 (L) 3.5 - 5.0 g/dL   AST 33 15 - 41 U/L   ALT 20 0 - 44 U/L   Alkaline Phosphatase 64 38 - 126 U/L   Total Bilirubin 0.6 0.3 - 1.2 mg/dL   GFR, Estimated >60 >60 mL/min    Comment: (NOTE) Calculated using the CKD-EPI Creatinine Equation (2021)    Anion gap 11 5 - 15    Comment: Performed at [facility], [place] [address], [place],  [address]  Prealbumin  Status: None   Collection Time: 01/26/21  9:30 AM  Result Value Ref Range   Prealbumin 27.6 18 - 38 mg/dL    Comment: Performed at [facility], [place] [address], [place], [place] [address]  Magnesium     Status: None   Collection Time: 01/26/21  9:30 AM  Result Value Ref Range   Magnesium 1.9 1.7 - 2.4 mg/dL    Comment: Performed at [facility], [place] [address], [place], [place] [address]  Glucose, capillary     Status: Abnormal   Collection Time: 01/26/21 11:19 AM  Result Value Ref Range   Glucose-Capillary 128 (H) 70 - 99 mg/dL    Comment: Glucose reference range applies only to samples taken after fasting for at least 8 hours.  Glucose, capillary     Status: Abnormal   Collection Time: 01/26/21  5:30 PM  Result Value Ref Range   Glucose-Capillary 138 (H) 70 - 99 mg/dL    Comment: Glucose reference range applies only to samples taken after fasting for at least 8 hours.   Comment 1 Notify RN    Comment 2 Document in Chart   Glucose, capillary     Status: None   Collection Time: 01/26/21  9:30 PM  Result Value Ref Range   Glucose-Capillary 96 70 - 99 mg/dL    Comment: Glucose reference range applies only to samples taken after fasting for at least 8 hours.  Glucose, capillary     Status: Abnormal   Collection Time: 01/27/21  7:54  AM  Result Value Ref Range   Glucose-Capillary 109 (H) 70 - 99 mg/dL    Comment: Glucose reference range applies only to samples taken after fasting for at least 8 hours.    Imaging / Studies: No results found.  Medications / Allergies: per chart  Antibiotics: Anti-infectives (From admission, onward)   Start     Dose/Rate Route Frequency Ordered Stop   01/26/21 1200  fluconazole (DIFLUCAN) tablet 200 mg        200 mg Oral Daily 01/26/21 1120 02/02/21 0959   01/15/21 1800  ciprofloxacin (CIPRO) IVPB 400 mg  Status:  Discontinued        400 mg 200 mL/hr over 60 Minutes Intravenous Every 12 hours 01/15/21 1609 01/19/21 0817   01/15/21 1700  metroNIDAZOLE (FLAGYL) IVPB 500 mg  Status:  Discontinued        500 mg 100 mL/hr over 60 Minutes Intravenous Every 8 hours 01/15/21 1609 01/19/21 0817   01/15/21 0800  cefoTEtan (CEFOTAN) 2 g in sodium chloride 0.9 % 100 mL IVPB        2 g 200 mL/hr over 30 Minutes Intravenous On call to O.R. 01/15/21 0705 01/15/21 1145        Note: Portions of this report may have been transcribed using voice recognition software. Every effort was made to ensure accuracy; however, inadvertent computerized transcription errors may be present.   Any transcriptional errors that result from this process are unintentional.    Adin Hector, MD, FACS, MASCRS  Gastrointestinal and Minimally Invasive Surgery  [facility] [address] [place], [place] [address] [phone] Fax [phone] Main/Paging  CONTACT INFORMATION: Weekday (9AM-5PM) concerns: Call [facility] main office at [phone] Weeknight (5PM-9AM) or Weekend/Holiday concerns: Check [email or web address] for General Surgery [facility] coverage (Please, do not use SecureChat as it is not reliable communication to operating surgeons for immediate patient care)      01/27/2021  8:17 AM

## 2021-01-28 LAB — GLUCOSE, CAPILLARY
Glucose-Capillary: 100 mg/dL — ABNORMAL HIGH (ref 70–99)
Glucose-Capillary: 114 mg/dL — ABNORMAL HIGH (ref 70–99)
Glucose-Capillary: 161 mg/dL — ABNORMAL HIGH (ref 70–99)
Glucose-Capillary: 118 mg/dL — ABNORMAL HIGH (ref 70–99)

## 2021-01-28 MED ORDER — LOPERAMIDE HCL 2 MG PO CAPS
4.0000 mg | ORAL_CAPSULE | Freq: Two times a day (BID) | ORAL | Status: DC
  Administered 2021-01-28 – 2021-01-29 (×2): 4 mg via ORAL
  Filled 2021-01-28 (×2): qty 2

## 2021-01-28 MED ORDER — FERROUS SULFATE 325 (65 FE) MG PO TABS
325.0000 mg | ORAL_TABLET | Freq: Two times a day (BID) | ORAL | Status: DC
  Administered 2021-01-28 – 2021-01-29 (×2): 325 mg via ORAL
  Filled 2021-01-28 (×2): qty 1

## 2021-01-28 MED ORDER — LOPERAMIDE HCL 2 MG PO CAPS: 2.0000 mg | ORAL_CAPSULE | Freq: Four times a day (QID) | ORAL | Status: DC | PRN

## 2021-01-28 NOTE — TOC Progression Note (Signed)
Transition of Care [facility]) - Progression Note   Patient Details  Name: ANAYANSI RUNDQUIST MRN: [id number] Date of Birth: [birth year]  Transition of [facility]) CM/SW [place], LCSW Phone Number: 01/28/2021, 10:32 AM  Clinical Narrative: OT recommended HHOT in addition to previous recommendation of HHPT. CSW followed up with Ramond Marrow with [facility]. Per Ramond Marrow, [facility] is able to provide OT as well as PT. TOC to follow.  Expected Discharge Plan: [place] Barriers to Discharge: Continued Medical Work up  Expected Discharge Plan and Services Expected Discharge Plan: [place] Discharge Planning Services: CM Consult Living arrangements for the past 2 months: Single Family Home Expected Discharge Date:  (unknown)               HH Arranged: PT,OT [place] Agency: [place] ([place]) Date [place]: 01/25/21 Time [place]: 1206 Representative spoke with at [place]: Corene Cornea  Readmission Risk Interventions Readmission Risk Prevention Plan 01/25/2021  Transportation Screening Complete  HRI or [place] Complete  Social Work Consult for [place] Planning/Counseling [place] Not Applicable  Medication Review Press photographer) Complete  Some recent data might be hidden

## 2021-01-28 NOTE — Discharge Instructions (Signed)
[facility], [place] [phone]  OPEN ABDOMINAL SURGERY: POST OP INSTRUCTIONS  Always review your discharge instruction sheet given to you by the facility where your surgery was performed.  IF YOU HAVE DISABILITY OR FAMILY LEAVE FORMS, YOU MUST BRING THEM TO THE OFFICE FOR PROCESSING.  PLEASE DO NOT GIVE THEM TO YOUR DOCTOR.  1. A prescription for pain medication may be given to you upon discharge.  Take your pain medication as prescribed, if needed.  If narcotic pain medicine is not needed, then you may take acetaminophen (Tylenol) or ibuprofen (Advil) as needed. 2. Take your usually prescribed medications unless otherwise directed. 3. If you need a refill on your pain medication, please contact your pharmacy. They will contact our office to request authorization.  Prescriptions will not be filled after 5pm or on week-ends. 4. You should follow a light diet the first few days after arrival home, such as soup and crackers, pudding, etc.unless your doctor has advised otherwise. A high-fiber, low fat diet can be resumed as tolerated.   Be sure to include lots of fluids daily. Most patients will experience some swelling and bruising on the chest and neck area.  Ice packs will help.  Swelling and bruising can take several days to resolve 5. Most patients will experience some swelling and bruising in the area of the incision. Ice pack will help. Swelling and bruising can take several days to resolve..  6. It is common to experience some constipation if taking pain medication after surgery.  Increasing fluid intake and taking a stool softener will usually help or prevent this problem from occurring.  A mild laxative (Milk of Magnesia or Miralax) should be taken according to package directions if there are no bowel movements after 48 hours. 7.  You may have steri-strips (small skin tapes) in place directly over the incision.  These strips should be left on the skin for 7-10 days.  If your  surgeon used skin glue on the incision, you may shower in 24 hours.  The glue will flake off over the next 2-3 weeks.  Any sutures or staples will be removed at the office during your follow-up visit. You may find that a light gauze bandage over your incision may keep your staples from being rubbed or pulled. You may shower and replace the bandage daily. 8. ACTIVITIES:  You may resume regular (light) daily activities beginning the next day--such as daily self-care, walking, climbing stairs--gradually increasing activities as tolerated.  You may have sexual intercourse when it is comfortable.  Refrain from any heavy lifting or straining until approved by your doctor. a. You may drive when you no longer are taking prescription pain medication, you can comfortably wear a seatbelt, and you can safely maneuver your car and apply brakes b. Return to Work: ___________________________________ 32. You should see your doctor in the office for a follow-up appointment approximately two weeks after your surgery.  Make sure that you call for this appointment within a day or two after you arrive home to insure a convenient appointment time. OTHER INSTRUCTIONS:  _____________________________________________________________ _____________________________________________________________  WHEN TO CALL YOUR DOCTOR: 1. Fever over 101.0 2. Inability to urinate 3. Nausea and/or vomiting 4. Extreme swelling or bruising 5. Continued bleeding from incision. 6. Increased pain, redness, or drainage from the incision. 7. Difficulty swallowing or breathing 8. Muscle cramping or spasms. 9. Numbness or tingling in hands or feet or around lips.  The clinic staff is available to  answer your questions during regular business hours.  Please don't hesitate to call and ask to speak to one of the nurses if you have concerns.  For further questions, please visit [email or web address]   Ileostomy, Care After This sheet  gives you information about how to care for yourself after your procedure. Your health care provider may also give you more specific instructions. If you have problems or questions, contact your health care provider. What can I expect after the procedure? After the procedure, it is common to have:  A small amount of blood or clear fluid leaking from your stoma.  Pain and discomfort in your abdomen, especially around your stoma.  Irregular bowel movements for several days.  Loose stool. Follow these instructions at home: Medicines  Take over-the-counter and prescription medicines only as told by your health care provider.  If you were prescribed an antibiotic medicine, take it as told by your health care provider. Do not stop taking the antibiotic even if you start to feel better. Stoma and incision care  Keep the skin that surrounds your stoma clean and dry.  Follow instructions from your health care provider about how to take care of your incision. Make sure you: ? Wash your hands with soap and water before and after you change your bandage (dressing). If soap and water are not available, use hand sanitizer. ? Change your dressing as told by your health care provider. ? Leave stitches (sutures), skin glue, or adhesive strips in place. These skin closures may need to stay in place for 2 weeks or longer. If adhesive strip edges start to loosen and curl up, you may trim the loose edges. Do not remove adhesive strips completely unless your health care provider tells you to do that.  Check your stoma area every day for signs of infection. Check for: ? More redness, swelling, or pain. ? More fluid or blood. ? Warmth. ? Pus or a bad smell.  Follow your health care provider's instructions about changing and cleaning your ostomy pouch.  Keep supplies with you at all times to care for your stoma and ostomy pouch. Also keep extra clothing with you.   Eating and drinking  Follow  instructions from your health care provider about eating or drinking restrictions.  Pay attention to which foods and drinks cause problems with digestion, such as gas, constipation, or diarrhea.  Avoid spicy foods and caffeine while your stoma heals.  Eat meals and snacks at regular intervals.  Drink enough fluid to keep your urine pale yellow.   Activity  Return to your normal activities as told by your health care provider. Ask your health care provider what activities are safe for you.  Rest as much as possible while your stoma heals.  Avoid intense physical activity for as long as you are told by your health care provider.  Do not lift anything that is heavier than 10 lb (4.5 kg), or the limit that you are told, for 6 weeks or until your health care provider says that it is safe.   General instructions  Do not drive or use heavy machinery while taking prescription pain medicine.  Wear compression stockings as told by your health care provider. These stockings help to prevent blood clots and reduce swelling in your legs.  Do not take baths, swim, or use a hot tub until your health care provider approves. Ask your health care provider if you may take showers.  Do not use any products that contain nicotine  or tobacco, such as cigarettes, e-cigarettes, and chewing tobacco. These can delay incision healing after surgery. If you need help quitting, ask your health care provider.  (Women) Talk with your health care provider if you plan to become pregnant or if you take birth control pills.  Keep all follow-up visits as told by your health care provider. This is important. Contact a health care provider if:  You have more redness, swelling, or pain at or around your stoma.  You have more fluid or blood coming from your stoma.  Your stoma feels warm to the touch.  You have pus or a bad smell coming from your stoma.  You have a fever.  You have loose stools that do not become  thicker after several weeks.  You have bowel movements more often or less often than your health care provider tells you to expect.  You feel nauseous.  You vomit.  You have abdominal pain, bloating, pressure, or cramping.  You have problems with sexual activity.  You have an unusual lack of energy (fatigue).  You are unusually thirsty or you always have a dry mouth. Get help right away if:  You feel dizzy or light-headed.  You have pain or cramps in your abdomen that get worse or do not go away with medicine.  Your stoma suddenly changes size or color.  You have shortness of breath.  You have bleeding from your stoma that does not stop.  You vomit more than one time.  You faint.  You have internal tissue coming out of your stoma (prolapse).  You have an irregular heartbeat.  You have chest pain. Summary  Take over-the-counter and prescription medicines only as told by your health care provider.  Follow your health care provider's instructions about how to take care of your incision and stoma.  Follow instructions from your health care provider about eating or drinking restrictions.  Do not take baths, swim, or use a hot tub until your health care provider approves. Ask your health care provider if you may take showers.  Contact a health care provider if you have more redness, swelling, or pain at or around your stoma. This information is not intended to replace advice given to you by your health care provider. Make sure you discuss any questions you have with your health care provider. Document Revised: 07/10/2018 Document Reviewed: 07/10/2018 Elsevier Patient Education  2021 [place]: - midline dressing to be changed twice daily - supplies: sterile saline, kerlix, scissors, ABD pads, tape  - remove dressing and all packing carefully, moistening with sterile saline as needed to avoid packing/internal dressing sticking to the wound. -  clean edges of skin around the wound with water/gauze, making sure there is no tape debris or leakage left on skin that could cause skin irritation or breakdown. - dampen and clean kerlix with sterile saline and pack wound from wound base to skin level, making sure to take note of any possible areas of wound tracking, tunneling and packing appropriately. Wound can be packed loosely. Trim kerlix to size if a whole kerlix is not required. - cover wound with a dry ABD pad and secure with tape.  - write the date/time on the dry dressing/tape to better track when the last dressing change occurred. - apply any skin protectant/powder recommended by clinician to protect skin/skin folds. - change dressing as needed if leakage occurs, wound gets contaminated, or patient requests to shower. - patient may shower daily with  wound open and following the shower the wound should be dried and a clean dressing placed.

## 2021-01-28 NOTE — Progress Notes (Signed)
11 Days Post-Op    CC: GI bleed  Subjective: Patient had emesis overnight after drinking a smoothie.  Has eaten breakfast this am and tolerated well.    Objective: Vital signs in last 24 hours: Temp:  [97.8 F (36.6 C)-98.8 F (37.1 C)] 98.6 F (37 C) (03/16 0427) Pulse Rate:  [67-73] 73 (03/16 0427) Resp:  [14-18] 17 (03/16 0427) BP: (104-134)/(68-76) 108/76 (03/16 0427) SpO2:  [80 %-99 %] 97 % (03/16 0427) Last BM Date: 01/27/21  Intake/Output from previous day: 03/15 0701 - 03/16 0700 In: 670 [P.O.:660; I.V.:10] Out: 1300 [Urine:950; Stool:350] Intake/Output this shift: Total I/O In: 240 [P.O.:240] Out: 350 [Urine:150; Stool:200]  Gen: NAD Abd: soft, minimally tender, wound is open and packed and clean.  +BS, ileostomy with lots of flatus and thick pudding like stools.  Stoma is pink  Lab Results:  Recent Labs    01/26/21 0930  WBC 13.7*  HGB 9.5*  HCT 30.5*  PLT 865*    BMET Recent Labs    01/26/21 0930 01/27/21 0852  NA 136 134*  K 3.3* 5.1  CL 101 97*  CO2 24 29  GLUCOSE 150* 127*  BUN 19 20  CREATININE 0.64 0.69  CALCIUM 8.0* 8.1*   PT/INR No results for input(s): LABPROT, INR in the last 72 hours.  Recent Labs  Lab 01/26/21 0930  AST 33  ALT 20  ALKPHOS 64  BILITOT 0.6  PROT 5.2*  ALBUMIN 2.3*     Lipase  No results found for: LIPASE   Medications: . acetaminophen  1,000 mg Oral Q6H  . budesonide  6 mg Oral Daily  . calcium carbonate  1,250 mg Oral Daily  . Chlorhexidine Gluconate Cloth  6 each Topical Daily  . enoxaparin (LOVENOX) injection  30 mg Subcutaneous Daily  . feeding supplement  237 mL Oral BID BM  . ferrous sulfate  325 mg Oral TID WC  . fluconazole  200 mg Oral Daily  . insulin aspart  0-15 Units Subcutaneous TID WC  . lip balm  1 application Topical BID  . loperamide  4 mg Oral TID AC & HS  . magic mouthwash w/lidocaine  10 mL Oral QID  . metoprolol tartrate  12.5 mg Oral BID  . multivitamin with minerals  1  tablet Oral Daily  . nystatin ointment   Topical BID  . pantoprazole  40 mg Oral BID  . polycarbophil  625 mg Oral BID  . potassium chloride  20 mEq Oral Daily  . pravastatin  10 mg Oral Daily  . sodium chloride flush  10-40 mL Intracatheter Q12H  . sodium chloride flush  3 mL Intravenous Q12H    Assessment/Plan Severe aortic stenosis AVA 0.84 cm/EF 60 to 86%/VHQIO 2 diastolic dysfunction(ECHO 01/15/2021) -Weight: 39.9>>55>>52.5 kg>>53.8kg>>>>53kg COPD-ongoing tobacco use/59-pack-year history Acute respiratory failure with hypoxia/fluid overload Hypertension/hypotension Hx collagenous colitis-budesonidepreadmit Anemia - transfusedx3  - H/H 9.9/29.7>>8.7/25.9>>9.7/30.6>>8.9/27.6>>8.7/27.4 Hyperlipidemia Moderate malnutrition/deconditioning -prealbumin 10.7>>27.6(3/14) Hyperglycemia -Hemoglobin A1c 7.4   Ischemic colitis; ischemic mass at the splenic flexure/dense intra-abdominal adhesions S/P colonoscopy, cold snare polypectomy and AVM ablation 01/10/2021; S/P angio embolization for GI bleed 01/12/2021 Exploratory laparotomy with partial colectomy and colostomy 01/15/2021 Dr. Erroll Luna. POD #13 Exploratory laparotomy-completion colectomy/ileostomy 01/17/2021 Dr. Ralene Ok, POD #11  -ileostomy down to 350cc with addition of fiber, iron, imodium -will keep today due to emesis yesterday.  If she tolerates her diet well today, then she can likely DC home tomorrow -HH hopefully being arranged for PT/OT/RN  NGE:XBMW dietIV  fluids on hold ID: Cefotetan preop; Cipro/Flagyl 3/2-3/7 - WBC 17.0(3/2)>>13.1>>15.0>>16.9>>13.3>>15.9(3/9)>>14.9 >> 13.7 DVT: SCDs/Lovenox Follow up - Dr. Brantley Stage Dispo - hopefully home tomorrow    LOS: 17 days    Kathleen Pope 01/28/2021 Please see Amion

## 2021-01-28 NOTE — Progress Notes (Signed)
Physical Therapy Treatment Patient Details Name: Kathleen Pope MRN: [id number] DOB: 1933/05/07 Today's Date: 01/28/2021    History of Present Illness Kathleen Pope is a 87 y.o. 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 Pt now s/p partial colectomy on 3/3 and Exp Lap with completion of colectomy and end ileostomy for con't GIB on 01/17/21.    PT Comments    Trialed ambulation without AD and with single HHA, pt continues to be slightly unsteady, reaching out for furniture, so educated on benefit of RW to steady self at home and pt in agreement. Pt ambulates with RW, completing head turns, direction changes, speed changes, sudden stops without LOB. Pt is modified independent with transfers, ambulation and bed mobility, pt educated on safety with mobility around the home and pt states her daughter will be staying with her 24/7 while she recovers. Pt able to complete toileting while therapist outside of door, no physical assist or verbal cues required. Will d/c from PT at this time due to progress, all education completed, and goals met. Please re-consult if needs arise.  Follow Up Recommendations  Home health PT;Supervision - Intermittent     Equipment Recommendations  3in1 (PT);Rolling walker with 5" wheels    Recommendations for Other Services       Precautions / Restrictions Precautions Precautions: Fall Precaution Comments: monitor vitals, recent abd sx, colostomy Restrictions Weight Bearing Restrictions: No    Mobility  Bed Mobility Overal bed mobility: Modified Independent  General bed mobility comments: modified independent with supine<>sit, slightly increased time with light use of bedrail to upright trunk    Transfers Overall transfer level: Modified independent Equipment used: Rolling walker (2  wheeled) Transfers: Sit to/from Stand Sit to Stand: Modified independent (Device/Increase time)    General transfer comment: BUE assisting to opwer up, no physical assistance, good steadiness upon standing  Ambulation/Gait Ambulation/Gait assistance: Modified independent (Device/Increase time) Gait Distance (Feet): 300 Feet Assistive device: Rolling walker (2 wheeled) Gait Pattern/deviations: Step-through pattern;Decreased stride length Gait velocity: slightly decreased   General Gait Details: pt ambulates with RW, good step through pattern, slightly decreased cadence, able to complete head turns, speed changes, sudden stop and direction changes without LOB while using RW. Attempted ambulation without AD, pt with unsteady balance and reaching for handrails so pt agreeable to using RW for safety   Stairs             Wheelchair Mobility    Modified Rankin (Stroke Patients Only)       Balance Overall balance assessment: Needs assistance Sitting-balance support: Feet supported Sitting balance-Leahy Scale: Good  Standing balance support: During functional activity;Single extremity supported;Bilateral upper extremity supported Standing balance-Leahy Scale: Fair Standing balance comment: static without UE support, dynamic with UE support         Cognition Arousal/Alertness: Awake/alert Behavior During Therapy: WFL for tasks assessed/performed Overall Cognitive Status: Within Functional Limits for tasks assessed                 Exercises      General Comments        Pertinent Vitals/Pain Pain Assessment: No/denies pain    Home Living                      Prior Function  PT Goals (current goals can now be found in the care plan section) Acute Rehab PT Goals Patient Stated Goal: Regain IND PT Goal Formulation: With patient Time For Goal Achievement: 02/11/21 Potential to Achieve Goals: Good    Frequency    Min 3X/week       PT Plan Equipment recommendations need to be updated    Co-evaluation              AM-PAC PT "6 Clicks" Mobility   Outcome Measure  Help needed turning from your back to your side while in a flat bed without using bedrails?: None Help needed moving from lying on your back to sitting on the side of a flat bed without using bedrails?: A Little Help needed moving to and from a bed to a chair (including a wheelchair)?: None Help needed standing up from a chair using your arms (e.g., wheelchair or bedside chair)?: None Help needed to walk in hospital room?: None Help needed climbing 3-5 steps with a railing? : None 6 Click Score: 23    End of Session   Activity Tolerance: Patient tolerated treatment well Patient left: in bed;with call bell/phone within reach;with bed alarm set Nurse Communication: Mobility status;Other (comment) (pt wanting colostomy bag checked) PT Visit Diagnosis: Difficulty in walking, not elsewhere classified (R26.2);Unsteadiness on feet (R26.81)     Time: 5825-1898 PT Time Calculation (min) (ACUTE ONLY): 15 min  Charges:  $Gait Training: 8-22 mins                      Tori Denica Web PT, DPT 01/28/21, 3:09 PM

## 2021-01-29 LAB — BASIC METABOLIC PANEL
Anion gap: 11 (ref 5–15)
BUN: 13 mg/dL (ref 8–23)
CO2: 27 mmol/L (ref 22–32)
Calcium: 8.3 mg/dL — ABNORMAL LOW (ref 8.9–10.3)
Chloride: 98 mmol/L (ref 98–111)
Creatinine, Ser: 0.64 mg/dL (ref 0.44–1.00)
GFR, Estimated: >60 mL/min (ref >60)
Glucose, Bld: 96 mg/dL (ref 70–99)
Potassium: 4.3 mmol/L (ref 3.5–5.1)
Sodium: 136 mmol/L (ref 135–145)

## 2021-01-29 LAB — GLUCOSE, CAPILLARY
Glucose-Capillary: 106 mg/dL — ABNORMAL HIGH (ref 70–99)
Glucose-Capillary: 156 mg/dL — ABNORMAL HIGH (ref 70–99)

## 2021-01-29 MED ORDER — LOPERAMIDE HCL 2 MG PO CAPS: 4.0000 mg | ORAL_CAPSULE | Freq: Two times a day (BID) | ORAL | 0 refills | Status: DC

## 2021-01-29 MED ORDER — POTASSIUM CHLORIDE CRYS ER 20 MEQ PO TBCR: 20.0000 meq | EXTENDED_RELEASE_TABLET | Freq: Every day | ORAL | 0 refills | Status: DC

## 2021-01-29 MED ORDER — CALCIUM POLYCARBOPHIL 625 MG PO TABS: 625.0000 mg | ORAL_TABLET | Freq: Two times a day (BID) | ORAL | Status: DC

## 2021-01-29 MED ORDER — ACETAMINOPHEN 500 MG PO TABS: 1000.0000 mg | ORAL_TABLET | Freq: Four times a day (QID) | ORAL | 0 refills | Status: DC | PRN

## 2021-01-29 MED ORDER — OXYCODONE HCL 5 MG PO TABS: 5.0000 mg | ORAL_TABLET | Freq: Four times a day (QID) | ORAL | 0 refills | Status: DC | PRN

## 2021-01-29 MED ORDER — FLUCONAZOLE 200 MG PO TABS: 200.0000 mg | ORAL_TABLET | Freq: Every day | ORAL | 0 refills | Status: AC

## 2021-01-29 NOTE — Care Management Important Message (Signed)
Important Message  Patient Details IM Letter given to the Patient. Name: Kathleen Pope MRN: [id number] Date of Birth: 02-04-1933   Medicare Important Message Given:  Yes     [name] 01/29/2021, 1:15 PM

## 2021-01-29 NOTE — Progress Notes (Signed)
Pt alert and oriented. D/C instructions given, pt d/cd to home.

## 2021-01-29 NOTE — Consult Note (Addendum)
[place] Nurse ostomy follow up Pt is discharging today.  Daughter at the bedside performed pouch change without assistance.  Pt states she has been emptying the pouch independently.  Pouch change performed and patient assisted with the process using a hand held mirror.  Applied barrier ring (# [id number]) and convex pouch [phone]) Stoma type/location: Ileostomy stoma is red and viable, above skin level, 1 1/4 inches.  Peristomal assessment: intact skin surrounding Output: mod amt brown liquid stool Ostomy pouching: 1pc.  Education provided:  Pt is able to open and close velcro without assistance.  Applied barrier ring and convex pouch. Reviewed pouching routines and ordering supplies.  5 sets of barrier rings and convex pouches left at the bedside, along with educational materials. Discussed dietary precautions and importance of avoiding dehydration. Enrolled patient in [place] Start Discharge program: Yes, previously Julien Girt MSN, [place], [place], [place], [place]

## 2021-01-29 NOTE — TOC Transition Note (Signed)
Transition of Care [facility]) - CM/SW Discharge Note  Patient Details  Name: Kathleen Pope MRN: [id number] Date of Birth: [birth year]  [facility] CM/SW Contact:  Sherie Don, LCSW Phone Number: 01/29/2021, 11:39 AM  Clinical Narrative: Patient to discharge home. CSW notified Ramond Marrow with [facility] patient will also need RN. Orders placed for HH (PT, OT, RN). CSW spoke with patient regarding DME needs and patient agreeable to 3N1 being ordered through [employer]. Patient has rolling walker at home. Referral made to [facility] from [place]. Order for 3N1 already placed. DME to be delivered to patient's room. TOC signing off.  Final next level of care: [place] Barriers to Discharge: Barriers Resolved  Patient Goals and CMS Choice Patient states their goals for this hospitalization and ongoing recovery are:: Go home CMS Medicare.gov Compare Post Acute Care list provided to:: Patient Choice offered to / list presented to : Patient  Discharge Plan and Services Discharge Planning Services: CM Consult       DME Arranged: 3-N-1 DME Agency: [employer] Date DME Agency Contacted: 01/29/21 Time DME Agency Contacted: 1001 Representative spoke with at DME Agency: [place]: RN,PT,OT [place] Agency: [place] ([place]) Date [place]: 01/29/21 Time [place]: 1003 Representative spoke with at [place]: [place]  Readmission Risk Interventions Readmission Risk Prevention Plan 01/25/2021  Transportation Screening Complete  HRI or [place] Complete  Social Work Consult for [place] Planning/Counseling [place] Screening Not Applicable  Medication Review Press photographer) Complete  Some recent data might be hidden

## 2021-01-29 NOTE — Discharge Summary (Signed)
Patient ID: Kathleen Pope [id number] [birth year] 87 y.o.  Admit date: 01/11/2021 Discharge date: 01/29/2021  Admitting Diagnosis: LGI bleed COPD HTN ABL anemia  Discharge Diagnosis Patient Active Problem List   Diagnosis Date Noted  . Hypokalemia 01/27/2021  . Ileostomy in place [facility]) 01/24/2021  . High output ileostomy ([place]) 01/24/2021  . Protein-calorie malnutrition, severe 01/21/2021  . Ischemic colitis with LGIB s/p colectomies & ileostomy 3/3 & 01/17/2021 01/19/2021  . Fluid overload 01/19/2021  . Hemorrhagic shock ([place]) 01/19/2021  . Benign neoplasm of cecum   . Benign neoplasm of ascending colon   . Abnormal CT of the abdomen   . Squamous cell carcinoma of skin of right middle finger 12/11/2020  . Pleural effusion on right 11/27/2020  . Loose stools 04/06/2017  . De Quervain's disease (tenosynovitis) 03/14/2014  . Hematochezia 03/09/2013  . External hemorrhoids 03/08/2013  . GI hemorrhage at cecal ulcer s/p colectomy/ileostomy 01/17/2021 08/18/2012  . AVM (arteriovenous malformation) of colon 08/18/2012  . HTN (hypertension) 08/18/2012  . Acute blood loss anemia 08/18/2012  . Bruit 08/04/2012  . Mixed hyperlipidemia 08/04/2012  . Cigarette smoker 08/04/2012  . Murmur 08/04/2012    Consultants GI General surgery CCM IR  Reason for Admission: [name] is a 87 y.o. female with medical history significant of recurrent rectal bleed due to AVMs and polyps, COPD, hypertension, hyperlipidemia, diverticulosis who was just discharged from the hospital yesterday secondary to admission for GI bleed.  During that hospitalization she had a bleeding scan done that was positive with mesenteric CT and she was embolized.  This was done on February 24.  She also had colonoscopy on 226 that showed multiple small polyps.  Patient went home on 1 day later was having massive rectal bleed.  She is still noted to be bleeding in the ER.  GI consulted.  Patient initiated on IV Protonix  and recommendation was to repeat repeated CT angiogram.  This is now positive for active bleeding at the same site from previous.  Patient being readmitted to the hospital for further evaluation and treatment..  ED Course: Temperature is 98.2 blood pressure 100/64 dropping to 80s momentarily pulse 130 respirate 20 oxygen sat 98% on room air.  White count 8.4 hemoglobin has dropped to 8.7 from 10.1 overnight platelets 224.  Sodium 143 potassium 3.6 chloride 106 CO2 27 BUN 16 creatinine 0.85 calcium 8.4.  Glucose is 200.  CT angiogram of the abdomen and pelvis showed active bleeding in the mesenteric site where patient recently had embolization.  Interventional radiology has been paged and patient will be admitted for further evaluation and treatment  Procedures Colonoscopy on 3/2, 3/4  IR embolization of left colic artery, Dr. Pascal Lux  Dr. Brantley Stage, 01/15/21  Ex lap with partial colectomy and end colostomy Dr. Rosendo Gros, 01/17/21  Ex lap with completion colectomy and ileostomy  Hospital Course:  The patient was admitted by medicine and work up ensued.  She was found to have rectal bleeding and IR consulted for a mesenteric embolization.  She then began bleeding again after this and underwent a flex sig which revealed ischemic necrosis of the descending colon with associated bleeding.  Surgery was then consulted and she underwent a left colectomy with a colostomy.  She did well for the first 12 hours or so and then began to bleed again through her colostomy.  She was rescoped with findings concerning for AVM bleeding in her cecum.  She returned to the OR for a completion colectomy  with an ileostomy.  She tolerated all of these procedures very well.  Her diet was able to be advanced as tolerated after surgery.  Supplements and ensure were added to help due to decrease in her appetite, but she was able to tolerate small amounts of solid food.  Her midilne wound was open and was packed with NS WD dressing changes.   Her ileostomy, when it started to work, had output over 2L.  This was controlled well with fibercon, iron, and imodium 4mg  BID.  Her output at the time of discharge was around 650cc of thick pudding like stool.  She was started on Kdur 20 meq daily to help with her electrolytes. She will get a BMET checked in 1 week after discharge.  She worked with therapies and HH PT/OT were recommended.  Her daughter will stay with her for assistance.  She was otherwise stable on POD 14,12 for DC home.  Of note the patient was apparently noted to have severe AS while here and will need to follow up with cardiology as an outpatient.  This was discovered by the medical team and was addressed prior to our service taking over her care.  Physical Exam: Gen: NAD Abd: soft, minimally tender, wound is open and packed and clean.  +BS, ileostomy with lots of flatus and thick pudding like stools.  Stoma is pink  Allergies as of 01/29/2021   No Known Allergies     Medication List    STOP taking these medications   losartan 50 MG tablet Commonly known as: COZAAR     TAKE these medications   acetaminophen 500 MG tablet Commonly known as: TYLENOL Take 2 tablets (1,000 mg total) by mouth every 6 (six) hours as needed.   aspirin EC 81 MG tablet Take 1 tablet (81 mg total) by mouth every other day.   budesonide 3 MG 24 hr capsule Commonly known as: ENTOCORT EC Take 9mg  for one month; then 6 mg for one month; then 3 mg for one month; then stop What changed:   how much to take  how to take this  when to take this   calcium carbonate 600 MG Tabs tablet Commonly known as: OS-CAL Take 600 mg by mouth daily.   ECHINACEA PO Take 1 capsule by mouth daily.   ferrous sulfate 325 (65 FE) MG EC tablet Take 325 mg by mouth 2 (two) times daily.   fluconazole 200 MG tablet Commonly known as: DIFLUCAN Take 1 tablet (200 mg total) by mouth daily for 3 days. Start taking on: January 30, 2021   loperamide 2 MG  capsule Commonly known as: IMODIUM Take 2 capsules (4 mg total) by mouth 2 (two) times daily.   metFORMIN 500 MG tablet Commonly known as: Glucophage Take 1 tablet (500 mg total) by mouth daily with breakfast.   metoprolol tartrate 25 MG tablet Commonly known as: LOPRESSOR Take 0.5 tablets (12.5 mg total) by mouth 2 (two) times daily.   oxyCODONE 5 MG immediate release tablet Commonly known as: Oxy IR/ROXICODONE Take 1 tablet (5 mg total) by mouth every 6 (six) hours as needed for moderate pain.   pantoprazole 40 MG tablet Commonly known as: Protonix Take 1 tablet (40 mg total) by mouth 2 (two) times daily.   polycarbophil 625 MG tablet Commonly known as: FIBERCON Take 1 tablet (625 mg total) by mouth 2 (two) times daily.   potassium chloride SA 20 MEQ tablet Commonly known as: KLOR-CON Take 1 tablet (20 mEq  total) by mouth daily. Start taking on: January 30, 2021   pravastatin 10 MG tablet Commonly known as: PRAVACHOL Take 10 mg by mouth daily.            Durable Medical Equipment  (From admission, onward)         Start     Ordered   01/27/21 1027  For home use only DME 3 n 1  Once        01/27/21 [place] Follow up.   Why: agency will provide home health physical therapy Contact information: [place] [address] [phone]        Erroll Luna, MD Follow up on 02/17/2021.   Specialty: General Surgery Why: Arrive at 2:50pm for 3:20pm appointment time  Contact information: [place] [address] [phone]        Redmond School, MD Follow up in 1 week(s).   Specialty: Internal Medicine Why: for post hospital follow up Contact information: [place] [address] [phone]        [facility] Follow up in 2 week(s).   Specialty: Cardiology Why: Please call to schedule an  appointment for aortic stenosis found in the hospital  Contact information: [place]              Signed: Saverio Danker, [facility] 01/29/2021, 11:32 AM Please see Amion for pager number during day hours 7:00am-4:30pm, 7-11:30am on Weekends

## 2021-01-30 DIAGNOSIS — I10 Essential (primary) hypertension: Secondary | ICD-10-CM | POA: Diagnosis not present

## 2021-01-30 DIAGNOSIS — Z4801 Encounter for change or removal of surgical wound dressing: Secondary | ICD-10-CM | POA: Diagnosis not present

## 2021-01-30 DIAGNOSIS — E43 Unspecified severe protein-calorie malnutrition: Secondary | ICD-10-CM | POA: Diagnosis not present

## 2021-01-30 DIAGNOSIS — E7800 Pure hypercholesterolemia, unspecified: Secondary | ICD-10-CM | POA: Diagnosis not present

## 2021-01-30 DIAGNOSIS — E782 Mixed hyperlipidemia: Secondary | ICD-10-CM | POA: Diagnosis not present

## 2021-01-30 DIAGNOSIS — K5730 Diverticulosis of large intestine without perforation or abscess without bleeding: Secondary | ICD-10-CM | POA: Diagnosis not present

## 2021-01-30 DIAGNOSIS — E876 Hypokalemia: Secondary | ICD-10-CM | POA: Diagnosis not present

## 2021-01-30 DIAGNOSIS — K644 Residual hemorrhoidal skin tags: Secondary | ICD-10-CM | POA: Diagnosis not present

## 2021-01-30 DIAGNOSIS — J449 Chronic obstructive pulmonary disease, unspecified: Secondary | ICD-10-CM | POA: Diagnosis not present

## 2021-01-30 DIAGNOSIS — L89151 Pressure ulcer of sacral region, stage 1: Secondary | ICD-10-CM | POA: Diagnosis not present

## 2021-01-30 DIAGNOSIS — Z432 Encounter for attention to ileostomy: Secondary | ICD-10-CM | POA: Diagnosis not present

## 2021-01-30 DIAGNOSIS — I350 Nonrheumatic aortic (valve) stenosis: Secondary | ICD-10-CM | POA: Diagnosis not present

## 2021-01-30 DIAGNOSIS — R011 Cardiac murmur, unspecified: Secondary | ICD-10-CM | POA: Diagnosis not present

## 2021-01-30 DIAGNOSIS — M654 Radial styloid tenosynovitis [de Quervain]: Secondary | ICD-10-CM | POA: Diagnosis not present

## 2021-01-30 DIAGNOSIS — I6529 Occlusion and stenosis of unspecified carotid artery: Secondary | ICD-10-CM | POA: Diagnosis not present

## 2021-01-30 DIAGNOSIS — Z48815 Encounter for surgical aftercare following surgery on the digestive system: Secondary | ICD-10-CM | POA: Diagnosis not present

## 2021-02-02 ENCOUNTER — Telehealth: Payer: Self-pay

## 2021-02-02 NOTE — Telephone Encounter (Signed)
-----   Message from Irene Shipper, MD sent at 01/13/2021  4:32 PM EST ----- Regarding: RE: Office follow-up with Amy I know.  Get her follow-up in 1 to 2 weeks with Amy after this most recent discharge.  Thanks ----- Message ----- From: Algernon Huxley, RN Sent: 01/13/2021  11:45 AM EST To: Irene Shipper, MD Subject: RE: Office follow-up with Amy                  Called pts home to schedule OV and pt is back in the hospital.  ----- Message ----- From: Irene Shipper, MD Sent: 01/10/2021  12:38 PM EST To: Alfredia Ferguson, PA-C, Algernon Huxley, RN Subject: Office follow-up with Linford Arnold, Please get this patient an office follow-up appointment with Amy in 1 to 2 weeks.  Thanks JP

## 2021-02-02 NOTE — Telephone Encounter (Signed)
Pt scheduled to see Nicoletta Ba PA 02/17/21 at 2:30pm, appt letter mailed to pt.

## 2021-02-04 DIAGNOSIS — I10 Essential (primary) hypertension: Secondary | ICD-10-CM | POA: Diagnosis not present

## 2021-02-04 DIAGNOSIS — Z681 Body mass index (BMI) 19 or less, adult: Secondary | ICD-10-CM | POA: Diagnosis not present

## 2021-02-04 DIAGNOSIS — K922 Gastrointestinal hemorrhage, unspecified: Secondary | ICD-10-CM | POA: Diagnosis not present

## 2021-02-04 DIAGNOSIS — D500 Iron deficiency anemia secondary to blood loss (chronic): Secondary | ICD-10-CM | POA: Diagnosis not present

## 2021-02-05 ENCOUNTER — Ambulatory Visit (HOSPITAL_COMMUNITY): Payer: Medicare Other | Attending: Cardiovascular Disease

## 2021-02-05 ENCOUNTER — Other Ambulatory Visit: Payer: Self-pay

## 2021-02-05 DIAGNOSIS — I350 Nonrheumatic aortic (valve) stenosis: Secondary | ICD-10-CM | POA: Insufficient documentation

## 2021-02-05 DIAGNOSIS — E876 Hypokalemia: Secondary | ICD-10-CM | POA: Diagnosis not present

## 2021-02-05 LAB — ECHOCARDIOGRAM COMPLETE
AR max vel: 1.03 cm2
AV Area VTI: 0.96 cm2
AV Area mean vel: 1.18 cm2
AV Mean grad: 33.5 mmHg
AV Peak grad: 60 mmHg
Ao pk vel: 3.87 m/s
Area-P 1/2: 2.22 cm2
S' Lateral: 1.3 cm

## 2021-02-05 MED ORDER — PERFLUTREN LIPID MICROSPHERE
1.0000 mL | INTRAVENOUS | Status: AC | PRN
  Administered 2021-02-05: 1 mL via INTRAVENOUS

## 2021-02-06 ENCOUNTER — Telehealth: Payer: Self-pay

## 2021-02-06 NOTE — Telephone Encounter (Signed)
RN called to discuss patient's results, she verified her DOB and asked RN to speak to her daughter [name]. RN stated that her aortic valve narrowing had worsened since 2013, which we call moderate to severe narrowing, and stated that this  needed to be watch closely with a repeat echo in 29months or sooner.  Pt's daughter stated patient just was discharged from hospital last Thursday from an ileostomy. Pt's daughter she would watch out for the following symptoms of worsening SOB, chest pain, lightheadedness, fatigue, decreased exercise capacity or LE edema, and would tell her mother to be aware of these symptoms, so if they occur, they will call the office and possibly have the echo sooner than 6 months.   Pt's daughter stated she understood the plans and didn't have any other questions or concerns at this time.  Shade Flood RN

## 2021-02-06 NOTE — Telephone Encounter (Signed)
-----   Message from Freada Bergeron, MD sent at 02/06/2021  1:26 PM EDT ----- Her echo shows that her aortic valve is moderately-to-severely narrowed. We need to watch this very closely with a repeat echo in 16months or sooner if she develops any symptoms of worsening SOB, chest pain, lightheadedness, fatigue, decreased exercise capacity or LE edema. If these symptoms occur, please let us know as soon as possible.

## 2021-02-17 ENCOUNTER — Ambulatory Visit: Payer: Medicare Other | Admitting: Physician Assistant

## 2021-02-18 ENCOUNTER — Other Ambulatory Visit: Payer: Self-pay | Admitting: Internal Medicine

## 2021-02-23 ENCOUNTER — Encounter: Payer: Self-pay | Admitting: Physician Assistant

## 2021-02-23 ENCOUNTER — Ambulatory Visit: Payer: Medicare Other | Admitting: Physician Assistant

## 2021-02-23 VITALS — BP 154/68 | HR 65 | Ht 61.5 in | Wt 86.4 lb

## 2021-02-23 DIAGNOSIS — Z9049 Acquired absence of other specified parts of digestive tract: Secondary | ICD-10-CM | POA: Diagnosis not present

## 2021-02-23 DIAGNOSIS — Z8719 Personal history of other diseases of the digestive system: Secondary | ICD-10-CM

## 2021-02-23 NOTE — Progress Notes (Signed)
Thank you for seeing Kathleen Pope

## 2021-02-23 NOTE — Patient Instructions (Signed)
If you are age 85 or older, your body mass index should be between 23-30. Your Body mass index is 16.06 kg/m. If this is out of the aforementioned range listed, please consider follow up with your Primary Care Provider.  If you are age 48 or younger, your body mass index should be between 19-25. Your Body mass index is 16.06 kg/m. If this is out of the aformentioned range listed, please consider follow up with your Primary Care Provider.   Thank you for choosing me and [facility].  Ellouise Newer, PA-C

## 2021-02-23 NOTE — Progress Notes (Signed)
Chief Complaint: Follow-up hospitalization for GI bleed  HPI:    Kathleen Pope is an 87 year old female with a past medical history as listed below including recent history of total colectomy, known to Dr. Henrene Pastor, who presents to clinic today for follow-up after being hospitalized for GI bleed.    01/12/2021 patient consulted by our service for recurrent GI bleeding.  Ended up undergoing a colonoscopy via colostomy advanced to the right colon and the cecum, there were a large amount of retained fresh blood and clots throughout the colon.  It was recommended that she have surgery to undergo a complete colectomy in order to save her life.  01/17/2021 patient underwent a complete colectomy.  She was finally discharged 01/29/2021.    Today, the patient presents to clinic accompanied by her daughter and they explained that she has been doing well since she got out of the hospital.  Apparently the surgical team has been following her anemia and rechecked labs as recent as 2 weeks ago.  She is adjusting to life with a colostomy which does make her somewhat depressed when she thinks about it at times, but she and her daughter both believe that she has lived through a miracle and has "bigger things left to do".  Patient has been gaining weight and had a great appetite which is "great news for her".  She is wondering if she needs to stay on the Budesonide.    Denies further bleeding, abdominal pain, fever, chills or weight loss.  Past Medical History:  Diagnosis Date  . Anemia    pt denies, yet takes iron  . Chronic airway obstruction, not elsewhere classified   . COPD (chronic obstructive pulmonary disease) ([place])   . Diverticulosis   . GI hemorrhage from post-treatment cecal ulcer 08/18/2012  . Hemorrhoids   . HTN (hypertension)   . Hypercholesteremia   . Lower GI bleed multiple   AVM's    Past Surgical History:  Procedure Laterality Date  . BUNIONECTOMY     left  . COLECTOMY    . COLON RESECTION N/A  01/17/2021   Procedure: EXPLORATORY LAPAROTOMY COMPLETION COLECTOMY ileostomy;  Surgeon: Ralene Ok, MD;  Location: [facility];  Service: General;  Laterality: N/A;  . COLONOSCOPY  08/10/2012   Procedure: COLONOSCOPY;  Surgeon: Ladene Artist, MD,FACG;  Location: [facility];  Service: Endoscopy;  Laterality: N/A;  . COLONOSCOPY  08/19/2012   Procedure: COLONOSCOPY;  Surgeon: Gatha Mayer, MD;  Location: [facility];  Service: Endoscopy;  Laterality: N/A;  . COLONOSCOPY N/A 03/11/2013   Procedure: COLONOSCOPY;  Surgeon: Irene Shipper, MD;  Location: [facility];  Service: Endoscopy;  Laterality: N/A;  . COLONOSCOPY N/A 01/16/2021   Procedure: COLONOSCOPY;  Surgeon: Doran Stabler, MD;  Location: [facility];  Service: Gastroenterology;  Laterality: N/A;  . COLONOSCOPY WITH PROPOFOL N/A 01/10/2021   Procedure: COLONOSCOPY WITH PROPOFOL;  Surgeon: Irene Shipper, MD;  Location: [facility];  Service: Endoscopy;  Laterality: N/A;  . COLOSTOMY N/A 01/15/2021   Procedure: COLOSTOMY;  Surgeon: Erroll Luna, MD;  Location: [facility];  Service: General;  Laterality: N/A;  . ESOPHAGOGASTRODUODENOSCOPY (EGD) WITH PROPOFOL N/A 01/14/2021   Procedure: ESOPHAGOGASTRODUODENOSCOPY (EGD) WITH PROPOFOL;  Surgeon: Doran Stabler, MD;  Location: [facility];  Service: Endoscopy;  Laterality: N/A;  . FLEXIBLE SIGMOIDOSCOPY N/A 01/14/2021   Procedure: FLEXIBLE SIGMOIDOSCOPY;  Surgeon: Doran Stabler, MD;  Location: [facility];  Service: Endoscopy;  Laterality: N/A;  . HOT HEMOSTASIS  N/A 01/10/2021   Procedure: HOT HEMOSTASIS (ARGON PLASMA COAGULATION/BICAP);  Surgeon: Irene Shipper, MD;  Location: [facility];  Service: Endoscopy;  Laterality: N/A;  . IR ANGIOGRAM SELECTIVE EACH ADDITIONAL VESSEL  01/12/2021  . IR ANGIOGRAM SELECTIVE EACH ADDITIONAL VESSEL  01/12/2021  . IR ANGIOGRAM SELECTIVE EACH ADDITIONAL VESSEL  01/12/2021  . IR ANGIOGRAM VISCERAL SELECTIVE  01/09/2021  . IR ANGIOGRAM VISCERAL SELECTIVE   01/12/2021  . IR EMBO ARTERIAL NOT HEMORR HEMANG INC GUIDE ROADMAPPING  01/12/2021  . IR US GUIDE VASC ACCESS RIGHT  01/09/2021  . IR US GUIDE VASC ACCESS RIGHT  01/12/2021  . PARTIAL COLECTOMY N/A 01/15/2021   Procedure: PARTIAL COLECTOMY;  Surgeon: Erroll Luna, MD;  Location: [facility];  Service: General;  Laterality: N/A;  . POLYPECTOMY  01/10/2021   Procedure: POLYPECTOMY;  Surgeon: Irene Shipper, MD;  Location: [facility];  Service: Endoscopy;;  . TONSILLECTOMY      Current Outpatient Medications  Medication Sig Dispense Refill  . acetaminophen (TYLENOL) 500 MG tablet Take 2 tablets (1,000 mg total) by mouth every 6 (six) hours as needed. 30 tablet 0  . aspirin EC 81 MG tablet Take 1 tablet (81 mg total) by mouth every other day. 30 tablet 11  . calcium carbonate (OS-CAL) 600 MG TABS Take 600 mg by mouth daily.    Marland Kitchen ECHINACEA PO Take 1 capsule by mouth daily.    . ferrous sulfate 325 (65 FE) MG EC tablet Take 325 mg by mouth 2 (two) times daily.     Marland Kitchen loperamide (IMODIUM) 2 MG capsule Take 2 capsules (4 mg total) by mouth 2 (two) times daily. 30 capsule 0  . metFORMIN (GLUCOPHAGE) 500 MG tablet Take 1 tablet (500 mg total) by mouth daily with breakfast. 30 tablet 11  . metoprolol tartrate (LOPRESSOR) 25 MG tablet Take 0.5 tablets (12.5 mg total) by mouth 2 (two) times daily. 60 tablet 1  . oxyCODONE (OXY IR/ROXICODONE) 5 MG immediate release tablet Take 1 tablet (5 mg total) by mouth every 6 (six) hours as needed for moderate pain. 10 tablet 0  . polycarbophil (FIBERCON) 625 MG tablet Take 1 tablet (625 mg total) by mouth 2 (two) times daily.    . potassium chloride SA (KLOR-CON) 20 MEQ tablet Take 1 tablet (20 mEq total) by mouth daily. 30 tablet 0  . pravastatin (PRAVACHOL) 10 MG tablet Take 10 mg by mouth daily.    . budesonide (ENTOCORT EC) 3 MG 24 hr capsule Take 9mg  for one month; then 6 mg for one month; then 3 mg for one month; then stop (Patient not taking: Reported on 02/23/2021)  180 capsule 1   No current facility-administered medications for this visit.    Allergies as of 02/23/2021  . (No Known Allergies)    Family History  Problem Relation Age of Onset  . Breast cancer Mother   . Lung cancer Father   . Breast cancer Sister   . Anuerysm Brother        in stomach  . Colon cancer Neg Hx   . Esophageal cancer Neg Hx   . Rectal cancer Neg Hx     Social History   Socioeconomic History  . Marital status: Widowed    Spouse name: Not on file  . Number of children: 1  . Years of education: Not on file  . Highest education level: Not on file  Occupational History  . Occupation: retired    Fish farm manager: RETIRED  Tobacco Use  .  Smoking status: Current Every Day Smoker    Packs/day: 1.00    Years: 59.00    Pack years: 59.00    Types: Cigarettes  . Smokeless tobacco: Never Used  . Tobacco comment: tobacco info given 06/09/18  Vaping Use  . Vaping Use: Never used  Substance and Sexual Activity  . Alcohol use: No  . Drug use: No  . Sexual activity: Never  Other Topics Concern  . Not on file  Social History Narrative  . Not on file   Social Determinants of Health   Financial Resource Strain: Not on file  Food Insecurity: Not on file  Transportation Needs: Not on file  Physical Activity: Not on file  Stress: Not on file  Social Connections: Not on file  Intimate Partner Violence: Not on file    Review of Systems:    Constitutional: No weight loss, fever or chills Cardiovascular: No chest pain Respiratory: No SOB  Gastrointestinal: See HPI and otherwise negative   Physical Exam:  Vital signs: BP (!) 154/68   Pulse 65   Ht 5' 1.5" (1.562 m)   Wt 86 lb 6.4 oz (39.2 kg)   BMI 16.06 kg/m   Constitutional:   Pleasant Elderly frail appearing Caucasian female appears to be in NAD, Well developed, Well nourished, alert and cooperative Respiratory: Respirations even and unlabored. Lungs clear to auscultation bilaterally.   No wheezes, crackles,  or rhonchi.  Cardiovascular: Normal S1, S2. No MRG. Regular rate and rhythm. No peripheral edema, cyanosis or pallor.  Gastrointestinal:  Soft, nondistended, nontender. No rebound or guarding. Normal bowel sounds. No appreciable masses or hepatomegaly. +colostomy with brown liquid stool Rectal:  Not performed.  Psychiatric: Demonstrates good judgement and reason without abnormal affect or behaviors.  RELEVANT LABS AND IMAGING: CBC    Component Value Date/Time   WBC 13.7 (H) 01/26/2021 0930   RBC 3.15 (L) 01/26/2021 0930   HGB 9.5 (L) 01/26/2021 0930   HCT 30.5 (L) 01/26/2021 0930   PLT 865 (H) 01/26/2021 0930   MCV 96.8 01/26/2021 0930   MCH 30.2 01/26/2021 0930   MCHC 31.1 01/26/2021 0930   RDW 17.0 (H) 01/26/2021 0930   LYMPHSABS 1.4 01/14/2021 0519   MONOABS 2.1 (H) 01/14/2021 0519   EOSABS 0.1 01/14/2021 0519   BASOSABS 0.0 01/14/2021 0519    CMP     Component Value Date/Time   NA 136 01/29/2021 0448   K 4.3 01/29/2021 0448   CL 98 01/29/2021 0448   CO2 27 01/29/2021 0448   GLUCOSE 96 01/29/2021 0448   BUN 13 01/29/2021 0448   CREATININE 0.64 01/29/2021 0448   CALCIUM 8.3 (L) 01/29/2021 0448   PROT 5.2 (L) 01/26/2021 0930   ALBUMIN 2.3 (L) 01/26/2021 0930   AST 33 01/26/2021 0930   ALT 20 01/26/2021 0930   ALKPHOS 64 01/26/2021 0930   BILITOT 0.6 01/26/2021 0930   GFRNONAA >60 01/29/2021 0448   GFRAA >90 03/10/2013 0535    Assessment: 1.  History of GI bleed: Patient recently underwent total colectomy to control GI bleed, doing well  Plan: 1.  Discussed with patient that she no longer needs to Budesonide since she no longer has a colon.  She is happy because "it cost a lot of money". 2.  Patient can follow-up with [place] as needed in the future.  She did want to tell Dr. Henrene Pastor "hello".  Ellouise Newer, PA-C [facility] 02/23/2021, 1:27 PM  Cc: Redmond School, MD

## 2021-03-01 DIAGNOSIS — E7800 Pure hypercholesterolemia, unspecified: Secondary | ICD-10-CM | POA: Diagnosis not present

## 2021-03-01 DIAGNOSIS — I10 Essential (primary) hypertension: Secondary | ICD-10-CM | POA: Diagnosis not present

## 2021-03-01 DIAGNOSIS — L89151 Pressure ulcer of sacral region, stage 1: Secondary | ICD-10-CM | POA: Diagnosis not present

## 2021-03-01 DIAGNOSIS — I350 Nonrheumatic aortic (valve) stenosis: Secondary | ICD-10-CM | POA: Diagnosis not present

## 2021-03-01 DIAGNOSIS — Z4801 Encounter for change or removal of surgical wound dressing: Secondary | ICD-10-CM | POA: Diagnosis not present

## 2021-03-01 DIAGNOSIS — M654 Radial styloid tenosynovitis [de Quervain]: Secondary | ICD-10-CM | POA: Diagnosis not present

## 2021-03-01 DIAGNOSIS — E782 Mixed hyperlipidemia: Secondary | ICD-10-CM | POA: Diagnosis not present

## 2021-03-01 DIAGNOSIS — K644 Residual hemorrhoidal skin tags: Secondary | ICD-10-CM | POA: Diagnosis not present

## 2021-03-01 DIAGNOSIS — R011 Cardiac murmur, unspecified: Secondary | ICD-10-CM | POA: Diagnosis not present

## 2021-03-01 DIAGNOSIS — E43 Unspecified severe protein-calorie malnutrition: Secondary | ICD-10-CM | POA: Diagnosis not present

## 2021-03-01 DIAGNOSIS — Z432 Encounter for attention to ileostomy: Secondary | ICD-10-CM | POA: Diagnosis not present

## 2021-03-01 DIAGNOSIS — Z48815 Encounter for surgical aftercare following surgery on the digestive system: Secondary | ICD-10-CM | POA: Diagnosis not present

## 2021-03-01 DIAGNOSIS — J449 Chronic obstructive pulmonary disease, unspecified: Secondary | ICD-10-CM | POA: Diagnosis not present

## 2021-03-01 DIAGNOSIS — I6529 Occlusion and stenosis of unspecified carotid artery: Secondary | ICD-10-CM | POA: Diagnosis not present

## 2021-03-01 DIAGNOSIS — K5730 Diverticulosis of large intestine without perforation or abscess without bleeding: Secondary | ICD-10-CM | POA: Diagnosis not present

## 2021-03-01 DIAGNOSIS — E876 Hypokalemia: Secondary | ICD-10-CM | POA: Diagnosis not present

## 2021-03-10 DIAGNOSIS — Z4801 Encounter for change or removal of surgical wound dressing: Secondary | ICD-10-CM | POA: Diagnosis not present

## 2021-03-10 DIAGNOSIS — L89151 Pressure ulcer of sacral region, stage 1: Secondary | ICD-10-CM | POA: Diagnosis not present

## 2021-03-10 DIAGNOSIS — Z48815 Encounter for surgical aftercare following surgery on the digestive system: Secondary | ICD-10-CM | POA: Diagnosis not present

## 2021-03-10 DIAGNOSIS — Z432 Encounter for attention to ileostomy: Secondary | ICD-10-CM | POA: Diagnosis not present

## 2021-03-13 DIAGNOSIS — C44622 Squamous cell carcinoma of skin of right upper limb, including shoulder: Secondary | ICD-10-CM | POA: Diagnosis not present

## 2021-03-13 DIAGNOSIS — D485 Neoplasm of uncertain behavior of skin: Secondary | ICD-10-CM | POA: Diagnosis not present

## 2021-03-13 DIAGNOSIS — D0461 Carcinoma in situ of skin of right upper limb, including shoulder: Secondary | ICD-10-CM | POA: Diagnosis not present

## 2021-03-14 DIAGNOSIS — E7849 Other hyperlipidemia: Secondary | ICD-10-CM | POA: Diagnosis not present

## 2021-03-14 DIAGNOSIS — I10 Essential (primary) hypertension: Secondary | ICD-10-CM | POA: Diagnosis not present

## 2021-03-31 DIAGNOSIS — Z86010 Personal history of colonic polyps: Secondary | ICD-10-CM | POA: Diagnosis not present

## 2021-03-31 DIAGNOSIS — Z9049 Acquired absence of other specified parts of digestive tract: Secondary | ICD-10-CM | POA: Diagnosis not present

## 2021-03-31 DIAGNOSIS — J449 Chronic obstructive pulmonary disease, unspecified: Secondary | ICD-10-CM | POA: Diagnosis not present

## 2021-03-31 DIAGNOSIS — K5730 Diverticulosis of large intestine without perforation or abscess without bleeding: Secondary | ICD-10-CM | POA: Diagnosis not present

## 2021-03-31 DIAGNOSIS — E7800 Pure hypercholesterolemia, unspecified: Secondary | ICD-10-CM | POA: Diagnosis not present

## 2021-03-31 DIAGNOSIS — Z432 Encounter for attention to ileostomy: Secondary | ICD-10-CM | POA: Diagnosis not present

## 2021-03-31 DIAGNOSIS — I350 Nonrheumatic aortic (valve) stenosis: Secondary | ICD-10-CM | POA: Diagnosis not present

## 2021-03-31 DIAGNOSIS — E876 Hypokalemia: Secondary | ICD-10-CM | POA: Diagnosis not present

## 2021-03-31 DIAGNOSIS — F17210 Nicotine dependence, cigarettes, uncomplicated: Secondary | ICD-10-CM | POA: Diagnosis not present

## 2021-03-31 DIAGNOSIS — K644 Residual hemorrhoidal skin tags: Secondary | ICD-10-CM | POA: Diagnosis not present

## 2021-03-31 DIAGNOSIS — M654 Radial styloid tenosynovitis [de Quervain]: Secondary | ICD-10-CM | POA: Diagnosis not present

## 2021-03-31 DIAGNOSIS — Z85828 Personal history of other malignant neoplasm of skin: Secondary | ICD-10-CM | POA: Diagnosis not present

## 2021-03-31 DIAGNOSIS — I10 Essential (primary) hypertension: Secondary | ICD-10-CM | POA: Diagnosis not present

## 2021-03-31 DIAGNOSIS — E782 Mixed hyperlipidemia: Secondary | ICD-10-CM | POA: Diagnosis not present

## 2021-03-31 DIAGNOSIS — E43 Unspecified severe protein-calorie malnutrition: Secondary | ICD-10-CM | POA: Diagnosis not present

## 2021-03-31 DIAGNOSIS — I6529 Occlusion and stenosis of unspecified carotid artery: Secondary | ICD-10-CM | POA: Diagnosis not present

## 2021-04-15 ENCOUNTER — Telehealth (HOSPITAL_COMMUNITY): Payer: Self-pay | Admitting: Nurse Practitioner

## 2021-04-20 DIAGNOSIS — I10 Essential (primary) hypertension: Secondary | ICD-10-CM | POA: Diagnosis not present

## 2021-04-20 DIAGNOSIS — Z432 Encounter for attention to ileostomy: Secondary | ICD-10-CM | POA: Diagnosis not present

## 2021-04-20 DIAGNOSIS — J449 Chronic obstructive pulmonary disease, unspecified: Secondary | ICD-10-CM | POA: Diagnosis not present

## 2021-04-20 DIAGNOSIS — E43 Unspecified severe protein-calorie malnutrition: Secondary | ICD-10-CM | POA: Diagnosis not present

## 2021-05-14 DIAGNOSIS — E7849 Other hyperlipidemia: Secondary | ICD-10-CM | POA: Diagnosis not present

## 2021-05-14 DIAGNOSIS — I10 Essential (primary) hypertension: Secondary | ICD-10-CM | POA: Diagnosis not present

## 2021-06-14 DIAGNOSIS — E782 Mixed hyperlipidemia: Secondary | ICD-10-CM | POA: Diagnosis not present

## 2021-06-14 DIAGNOSIS — I10 Essential (primary) hypertension: Secondary | ICD-10-CM | POA: Diagnosis not present

## 2021-07-13 DIAGNOSIS — Z681 Body mass index (BMI) 19 or less, adult: Secondary | ICD-10-CM | POA: Diagnosis not present

## 2021-07-13 DIAGNOSIS — Z634 Disappearance and death of family member: Secondary | ICD-10-CM | POA: Diagnosis not present

## 2021-07-13 DIAGNOSIS — I10 Essential (primary) hypertension: Secondary | ICD-10-CM | POA: Diagnosis not present

## 2021-07-13 DIAGNOSIS — J449 Chronic obstructive pulmonary disease, unspecified: Secondary | ICD-10-CM | POA: Diagnosis not present

## 2021-08-18 ENCOUNTER — Other Ambulatory Visit (HOSPITAL_COMMUNITY): Payer: Self-pay | Admitting: Internal Medicine

## 2021-08-18 DIAGNOSIS — Z1231 Encounter for screening mammogram for malignant neoplasm of breast: Secondary | ICD-10-CM

## 2021-09-16 DIAGNOSIS — J209 Acute bronchitis, unspecified: Secondary | ICD-10-CM | POA: Diagnosis not present

## 2021-09-16 DIAGNOSIS — J439 Emphysema, unspecified: Secondary | ICD-10-CM | POA: Diagnosis not present

## 2021-09-16 DIAGNOSIS — Z6820 Body mass index (BMI) 20.0-20.9, adult: Secondary | ICD-10-CM | POA: Diagnosis not present

## 2021-09-16 DIAGNOSIS — J449 Chronic obstructive pulmonary disease, unspecified: Secondary | ICD-10-CM | POA: Diagnosis not present

## 2021-09-21 ENCOUNTER — Ambulatory Visit (HOSPITAL_COMMUNITY): Payer: Medicare Other

## 2021-11-04 DIAGNOSIS — Z23 Encounter for immunization: Secondary | ICD-10-CM | POA: Diagnosis not present

## 2021-11-04 DIAGNOSIS — I350 Nonrheumatic aortic (valve) stenosis: Secondary | ICD-10-CM | POA: Diagnosis not present

## 2021-11-04 DIAGNOSIS — Z0001 Encounter for general adult medical examination with abnormal findings: Secondary | ICD-10-CM | POA: Diagnosis not present

## 2021-11-04 DIAGNOSIS — I10 Essential (primary) hypertension: Secondary | ICD-10-CM | POA: Diagnosis not present

## 2021-11-13 DIAGNOSIS — E782 Mixed hyperlipidemia: Secondary | ICD-10-CM | POA: Diagnosis not present

## 2021-11-13 DIAGNOSIS — I10 Essential (primary) hypertension: Secondary | ICD-10-CM | POA: Diagnosis not present

## 2021-11-19 DIAGNOSIS — M5416 Radiculopathy, lumbar region: Secondary | ICD-10-CM | POA: Diagnosis not present

## 2021-11-20 ENCOUNTER — Other Ambulatory Visit: Payer: Self-pay

## 2021-11-20 ENCOUNTER — Ambulatory Visit (HOSPITAL_COMMUNITY)
Admission: RE | Admit: 2021-11-20 | Discharge: 2021-11-20 | Disposition: A | Discharge status: Home / Self Care | Payer: Medicare Other | Source: Ambulatory Visit | Attending: Family Medicine | Admitting: Family Medicine

## 2021-11-20 ENCOUNTER — Other Ambulatory Visit (HOSPITAL_COMMUNITY): Payer: Self-pay | Admitting: Family Medicine

## 2021-11-20 DIAGNOSIS — M25551 Pain in right hip: Secondary | ICD-10-CM | POA: Insufficient documentation

## 2021-11-20 DIAGNOSIS — M25571 Pain in right ankle and joints of right foot: Secondary | ICD-10-CM

## 2021-11-20 DIAGNOSIS — Z6820 Body mass index (BMI) 20.0-20.9, adult: Secondary | ICD-10-CM | POA: Diagnosis not present

## 2021-12-01 DIAGNOSIS — E782 Mixed hyperlipidemia: Secondary | ICD-10-CM | POA: Diagnosis not present

## 2021-12-01 DIAGNOSIS — E119 Type 2 diabetes mellitus without complications: Secondary | ICD-10-CM | POA: Diagnosis not present

## 2021-12-01 DIAGNOSIS — E7849 Other hyperlipidemia: Secondary | ICD-10-CM | POA: Diagnosis not present

## 2021-12-01 DIAGNOSIS — I10 Essential (primary) hypertension: Secondary | ICD-10-CM | POA: Diagnosis not present

## 2021-12-11 ENCOUNTER — Encounter: Payer: Self-pay | Admitting: Orthopedic Surgery

## 2021-12-11 ENCOUNTER — Ambulatory Visit: Payer: Medicare Other

## 2021-12-11 ENCOUNTER — Ambulatory Visit (INDEPENDENT_AMBULATORY_CARE_PROVIDER_SITE_OTHER): Payer: Medicare Other | Admitting: Orthopedic Surgery

## 2021-12-11 ENCOUNTER — Other Ambulatory Visit: Payer: Self-pay

## 2021-12-11 VITALS — BP 148/72 | HR 69 | Ht 61.0 in | Wt 82.1 lb

## 2021-12-11 DIAGNOSIS — M5441 Lumbago with sciatica, right side: Secondary | ICD-10-CM

## 2021-12-11 MED ORDER — GABAPENTIN 100 MG PO CAPS: 100.0000 mg | ORAL_CAPSULE | Freq: Three times a day (TID) | ORAL | 0 refills | Status: DC

## 2021-12-11 NOTE — Patient Instructions (Addendum)
Gabapentin for nerve pain - prescription  Consider ibuprofen as well, available over the counter.    Smoking Tobacco Information, Adult Smoking tobacco can be harmful to your health. Tobacco contains a poisonous (toxic), colorless chemical called nicotine. Nicotine is addictive. It changes the brain and can make it hard to stop smoking. Tobacco also has other toxic chemicals that can hurt your body and raise your risk of many cancers. How can smoking tobacco affect me? Smoking tobacco puts you at risk for: Cancer. Smoking is most commonly associated with lung cancer, but can also lead to cancer in other parts of the body. Chronic obstructive pulmonary disease (COPD). This is a long-term lung condition that makes it hard to breathe. It also gets worse over time. High blood pressure (hypertension), heart disease, stroke, or heart attack. Lung infections, such as pneumonia. Cataracts. This is when the lenses in the eyes become clouded. Digestive problems. This may include peptic ulcers, heartburn, and gastroesophageal reflux disease (GERD). Oral health problems, such as gum disease and tooth loss. Loss of taste and smell. Smoking can affect your appearance by causing: Wrinkles. Yellow or stained teeth, fingers, and fingernails. Smoking tobacco can also affect your social life, because: It may be challenging to find places to smoke when away from home. Many workplaces, [employer], hotels, and public places are tobacco-free. Smoking is expensive. This is due to the cost of tobacco and the long-term costs of treating health problems from smoking. Secondhand smoke may affect those around you. Secondhand smoke can cause lung cancer, breathing problems, and heart disease. Children of smokers have a higher risk for: Sudden infant death syndrome (SIDS). Ear infections. Lung infections. If you currently smoke tobacco, quitting now can help you: Lead a longer and healthier life. Look, smell,  breathe, and feel better over time. Save money. Protect others from the harms of secondhand smoke. What actions can I take to prevent health problems? Quit smoking  Do not start smoking. Quit if you already do. Make a plan to quit smoking and commit to it. Look for programs to help you and ask your health care provider for recommendations and ideas. Set a date and write down all the reasons you want to quit. Let your friends and family know you are quitting so they can help and support you. Consider finding friends who also want to quit. It can be easier to quit with someone else, so that you can support each other. Talk with your health care provider about using nicotine replacement medicines to help you quit, such as gum, lozenges, patches, sprays, or pills. Do not replace cigarette smoking with electronic cigarettes, which are commonly called e-cigarettes. The safety of e-cigarettes is not known, and some may contain harmful chemicals. If you try to quit but return to smoking, stay positive. It is common to slip up when you first quit, so take it one day at a time. Be prepared for cravings. When you feel the urge to smoke, chew gum or suck on hard candy. Lifestyle Stay busy and take care of your body. Drink enough fluid to keep your urine pale yellow. Get plenty of exercise and eat a healthy diet. This can help prevent weight gain after quitting. Monitor your eating habits. Quitting smoking can cause you to have a larger appetite than when you smoke. Find ways to relax. Go out with friends or family to a movie or a restaurant where people do not smoke. Ask your health care provider about having regular tests (screenings)  to check for cancer. This may include blood tests, imaging tests, and other tests. Find ways to manage your stress, such as meditation, yoga, or exercise. Where to find support To get support to quit smoking, consider: Asking your health care provider for more information  and resources. Taking classes to learn more about quitting smoking. Looking for local organizations that offer resources about quitting smoking. Joining a support group for people who want to quit smoking in your local community. Calling the smokefree.gov counselor helpline: 1-800-Quit-Now 205-515-2560) Where to find more information You may find more information about quitting smoking from: HelpGuide.org: www.helpguide.org https://hall.com/: smokefree.gov American Lung Association: www.lung.org Contact a health care provider if you: Have problems breathing. Notice that your lips, nose, or fingers turn blue. Have chest pain. Are coughing up blood. Feel faint or you pass out. Have other health changes that cause you to worry. Summary Smoking tobacco can negatively affect your health, the health of those around you, your finances, and your social life. Do not start smoking. Quit if you already do. If you need help quitting, ask your health care provider. Think about joining a support group for people who want to quit smoking in your local community. There are many effective programs that will help you to quit this behavior. This information is not intended to replace advice given to you by your health care provider. Make sure you discuss any questions you have with your health care provider. Document Revised: 07/06/2021 Document Reviewed: 09/23/2020 Elsevier Patient Education  2022 [employer].

## 2021-12-11 NOTE — Progress Notes (Signed)
New Patient Visit  Assessment: Kathleen Pope is a 88 y.o. female with the following: 1. Acute midline low back pain with right-sided sciatica  Plan: Patient has pain throughout the right leg.  She has difficulty ambulating, even with a walker due to the pain.  Pain starts in the right buttock, and lower back area.  Radiographs demonstrates an isthmic spondylolisthesis at L5-S1, with a pars defect.  We discussed multiple treatment options, including medications, physical therapy and potential referral to neurosurgery, as well as an MRI and possible referral for image guided injections.  She has 0 interest in surgery.  She would like to consider medications at this time.  If she has further issues, we will likely work towards obtaining an MRI so that she can proceed with image guided injections.  I provided her with a prescription for gabapentin.  She will continue to take over-the-counter Tylenol, as well as ibuprofen as needed.   Follow-up: Return if symptoms worsen or fail to improve.  Subjective:  Chief Complaint  Patient presents with   Ankle Pain    R/ pain just started 3 weeks ago and it goes up leg and lodges in the groin area.    History of Present Illness: Kathleen Pope is a 88 y.o. female who presents for evaluation of right leg pain.  Initially, she states that the pain starts in her right ankle, and radiates proximally into her right groin.  She also notes pain in the posterior aspect of the right hip, within the buttock.  She also has pain in the right side of the lower back.  No specific injury.  This pain started approximately 3 weeks ago.  The pain is sharp, especially if she puts weight on her right leg.  She already walks with a walker at baseline.  She is been taking Tylenol, with minimal improvement in her symptoms.  She has not worked with physical therapy.  She lives alone, and is currently taking care of her daughter's dog, after her passing.  Currently, she has no numbness  or tingling.  She does note radiating pains up and down her right leg.   Review of Systems: No fevers or chills No numbness or tingling No chest pain No shortness of breath No bowel or bladder dysfunction No GI distress No headaches   Medical History:  Past Medical History:  Diagnosis Date   Anemia    pt denies, yet takes iron   Chronic airway obstruction, not elsewhere classified    COPD (chronic obstructive pulmonary disease) ([place])    Diverticulosis    GI hemorrhage from post-treatment cecal ulcer 08/18/2012   Hemorrhoids    HTN (hypertension)    Hypercholesteremia    Lower GI bleed multiple   AVM's    Past Surgical History:  Procedure Laterality Date   BUNIONECTOMY     left   COLECTOMY     COLON RESECTION N/A 01/17/2021   Procedure: EXPLORATORY LAPAROTOMY COMPLETION COLECTOMY ileostomy;  Surgeon: Ralene Ok, MD;  Location: [facility];  Service: General;  Laterality: N/A;   COLONOSCOPY  08/10/2012   Procedure: COLONOSCOPY;  Surgeon: Ladene Artist, MD,FACG;  Location: [facility];  Service: Endoscopy;  Laterality: N/A;   COLONOSCOPY  08/19/2012   Procedure: COLONOSCOPY;  Surgeon: Gatha Mayer, MD;  Location: [facility];  Service: Endoscopy;  Laterality: N/A;   COLONOSCOPY N/A 03/11/2013   Procedure: COLONOSCOPY;  Surgeon: Irene Shipper, MD;  Location: [facility];  Service: Endoscopy;  Laterality: N/A;  COLONOSCOPY N/A 01/16/2021   Procedure: COLONOSCOPY;  Surgeon: Doran Stabler, MD;  Location: [facility];  Service: Gastroenterology;  Laterality: N/A;   COLONOSCOPY WITH PROPOFOL N/A 01/10/2021   Procedure: COLONOSCOPY WITH PROPOFOL;  Surgeon: Irene Shipper, MD;  Location: [facility];  Service: Endoscopy;  Laterality: N/A;   COLOSTOMY N/A 01/15/2021   Procedure: COLOSTOMY;  Surgeon: Erroll Luna, MD;  Location: [facility];  Service: General;  Laterality: N/A;   ESOPHAGOGASTRODUODENOSCOPY (EGD) WITH PROPOFOL N/A 01/14/2021   Procedure: ESOPHAGOGASTRODUODENOSCOPY  (EGD) WITH PROPOFOL;  Surgeon: Doran Stabler, MD;  Location: [facility];  Service: Endoscopy;  Laterality: N/A;   FLEXIBLE SIGMOIDOSCOPY N/A 01/14/2021   Procedure: FLEXIBLE SIGMOIDOSCOPY;  Surgeon: Doran Stabler, MD;  Location: [facility];  Service: Endoscopy;  Laterality: N/A;   HOT HEMOSTASIS N/A 01/10/2021   Procedure: HOT HEMOSTASIS (ARGON PLASMA COAGULATION/BICAP);  Surgeon: Irene Shipper, MD;  Location: [facility];  Service: Endoscopy;  Laterality: N/A;   IR ANGIOGRAM SELECTIVE EACH ADDITIONAL VESSEL  01/12/2021   IR ANGIOGRAM SELECTIVE EACH ADDITIONAL VESSEL  01/12/2021   IR ANGIOGRAM SELECTIVE EACH ADDITIONAL VESSEL  01/12/2021   IR ANGIOGRAM VISCERAL SELECTIVE  01/09/2021   IR ANGIOGRAM VISCERAL SELECTIVE  01/12/2021   IR EMBO ARTERIAL NOT HEMORR HEMANG INC GUIDE ROADMAPPING  01/12/2021   IR US GUIDE VASC ACCESS RIGHT  01/09/2021   IR US GUIDE VASC ACCESS RIGHT  01/12/2021   PARTIAL COLECTOMY N/A 01/15/2021   Procedure: PARTIAL COLECTOMY;  Surgeon: Erroll Luna, MD;  Location: [facility];  Service: General;  Laterality: N/A;   POLYPECTOMY  01/10/2021   Procedure: POLYPECTOMY;  Surgeon: Irene Shipper, MD;  Location: [facility];  Service: Endoscopy;;   TONSILLECTOMY      Family History  Problem Relation Age of Onset   Breast cancer Mother    Lung cancer Father    Breast cancer Sister    Anuerysm Brother        in stomach   Colon cancer Neg Hx    Esophageal cancer Neg Hx    Rectal cancer Neg Hx    Social History   Tobacco Use   Smoking status: Every Day    Packs/day: 1.00    Years: 59.00    Pack years: 59.00    Types: Cigarettes   Smokeless tobacco: Never   Tobacco comments:    tobacco info given 06/09/18  Vaping Use   Vaping Use: Never used  Substance Use Topics   Alcohol use: No   Drug use: No    No Known Allergies  Current Meds  Medication Sig   gabapentin (NEURONTIN) 100 MG capsule Take 1 capsule (100 mg total) by mouth 3 (three) times daily.     Objective: BP (!) 148/72    Pulse 69    Ht 5\' 1"  (1.549 m)    Wt 82 lb 2 oz (37.3 kg)    BMI 15.52 kg/m   Physical Exam:  General: Elderly female. and Alert and oriented. Gait: Ambulates with the assistance of a walker.  Evaluation of the right leg demonstrates minimal pain with internal and external rotation of the right hip.  Free and painless range of motion of the right ankle.  She does have some tenderness to palpation within the right buttock.  No tenderness to palpation over the greater trochanter on the right.  Tenderness to palpation within the right side of her lower back.  Negative straight leg raise.  Toes are warm and  well-perfused.  IMAGING: I personally ordered and reviewed the following images   Standing lumbar x-rays were obtained in clinic today.  There is a spondylolisthesis at L5-S1, with what appears to be a pars defect within this area.  Thoracolumbar scoliosis, with possible chronic trauma in this area.  Impression: Lumbar x-rays with L5-S1 anterolisthesis and chronic pars defect   New Medications:  Meds ordered this encounter  Medications   gabapentin (NEURONTIN) 100 MG capsule    Sig: Take 1 capsule (100 mg total) by mouth 3 (three) times daily.    Dispense:  90 capsule    Refill:  0      Mordecai Rasmussen, MD  12/11/2021 10:33 PM

## 2021-12-16 ENCOUNTER — Telehealth: Payer: Self-pay | Admitting: Orthopedic Surgery

## 2021-12-16 DIAGNOSIS — M5441 Lumbago with sciatica, right side: Secondary | ICD-10-CM

## 2021-12-16 NOTE — Telephone Encounter (Signed)
Patient called; requests to have Dr Amedeo Kinsman' nurse call her back regarding MRI, and also some injections she would need to be scheduled for - please call [phone]

## 2021-12-16 NOTE — Telephone Encounter (Signed)
Spoke with pt who would like to proceed with getting her MRI done so that she can possibly get the injections mentioned during office visit. MRI ordered at this time. Spoke with pt about the process for scheduling, verbalized understanding and will book a f/u after MRI appointment.

## 2021-12-29 ENCOUNTER — Other Ambulatory Visit: Payer: Self-pay

## 2021-12-29 ENCOUNTER — Ambulatory Visit (HOSPITAL_COMMUNITY)
Admission: RE | Admit: 2021-12-29 | Discharge: 2021-12-29 | Disposition: A | Discharge status: Home / Self Care | Payer: Medicare Other | Source: Ambulatory Visit | Attending: Orthopedic Surgery | Admitting: Orthopedic Surgery

## 2021-12-29 DIAGNOSIS — M5441 Lumbago with sciatica, right side: Secondary | ICD-10-CM | POA: Insufficient documentation

## 2021-12-29 DIAGNOSIS — M5116 Intervertebral disc disorders with radiculopathy, lumbar region: Secondary | ICD-10-CM | POA: Diagnosis not present

## 2022-01-01 ENCOUNTER — Other Ambulatory Visit: Payer: Self-pay | Admitting: Radiology

## 2022-01-01 ENCOUNTER — Ambulatory Visit (INDEPENDENT_AMBULATORY_CARE_PROVIDER_SITE_OTHER): Payer: Medicare Other | Admitting: Orthopedic Surgery

## 2022-01-01 ENCOUNTER — Other Ambulatory Visit: Payer: Self-pay

## 2022-01-01 ENCOUNTER — Encounter: Payer: Self-pay | Admitting: Orthopedic Surgery

## 2022-01-01 VITALS — BP 146/66 | HR 86 | Ht 61.0 in | Wt 86.0 lb

## 2022-01-01 DIAGNOSIS — M5441 Lumbago with sciatica, right side: Secondary | ICD-10-CM

## 2022-01-01 MED ORDER — GABAPENTIN 100 MG PO CAPS: 200.0000 mg | ORAL_CAPSULE | Freq: Three times a day (TID) | ORAL | 0 refills | Status: DC

## 2022-01-01 NOTE — Patient Instructions (Signed)
Referral for injections on your back  Please call if you need a refill of gabapentin

## 2022-01-01 NOTE — Telephone Encounter (Signed)
Patient states she needs increased dose of the Gabapentin sent in to [facility]

## 2022-01-01 NOTE — Progress Notes (Signed)
New Patient Visit  Assessment: Kathleen Pope is a 88 y.o. female with the following: 1. Acute midline low back pain with right-sided sciatica  Plan: Patient continues to have low back pain, with radiating pains into her right leg.  Some pain in her right groin area.  Previous hip x-rays are without identifiable pathology.  We reviewed the MRI in clinic today, which demonstrates several areas of concern.  She is not interested in surgery.  As result, we placed referral for image guided injections.  She will schedule an appoint with Dr. Ernestina Patches.  He can consider injections in the right hip, if injections in her back are not successful.  Gabapentin is providing some relief, and she is interested in increasing the dose to 200 mg as needed.  Follow-up as needed.   Follow-up: Return if symptoms worsen or fail to improve.  Subjective:  No chief complaint on file.   History of Present Illness: Kathleen Pope is a 88 y.o. female who presents for repeat evaluation of lower back pain.  She was seen in clinic a few weeks ago.  She continues to have pain in the lower back, which radiates into the anterior aspect of her right thigh, as well as distally into her leg.  Gabapentin has provided some improvement in her symptoms, but she feels as though an increased dose would be more beneficial.  She has had an MRI of the lumbar spine, and presents to review the results.  Review of Systems: No fevers or chills No numbness or tingling No chest pain No shortness of breath No bowel or bladder dysfunction No GI distress No headaches   Objective: BP (!) 146/66    Pulse 86    Ht 5\' 1"  (1.549 m)    Wt 86 lb (39 kg)    BMI 16.25 kg/m   Physical Exam:  General: Elderly female. and Alert and oriented. Gait: Ambulates with the assistance of a cane.  Evaluation of the right leg demonstrates minimal pain with internal and external rotation of the right hip.  Free and painless range of motion of the right ankle.   She does have some tenderness to palpation within the right buttock.  No tenderness to palpation over the greater trochanter on the right.  Tenderness to palpation within the right side of her lower back.  Negative straight leg raise.  Toes are warm and well-perfused.  IMAGING: I personally ordered and reviewed the following images  Lumbar spine MRI  Impression:  1. Large central disc extrusion with inferior migration at T12-L1 that impinges on the conus medullaris. 2. Severe right L5-S1 neural foraminal stenosis. 3. Moderate left L2-3 neural foraminal stenosis.   New Medications:  Meds ordered this encounter  Medications   gabapentin (NEURONTIN) 100 MG capsule    Sig: Take 2 capsules (200 mg total) by mouth 3 (three) times daily.    Dispense:  90 capsule    Refill:  0      Mordecai Rasmussen, MD  01/01/2022 12:02 PM

## 2022-01-01 NOTE — Addendum Note (Signed)
Addended by: Larena Glassman A on: 01/01/2022 01:30 PM   Modules accepted: Orders

## 2022-01-04 ENCOUNTER — Other Ambulatory Visit: Payer: Self-pay | Admitting: Orthopedic Surgery

## 2022-01-04 NOTE — Telephone Encounter (Signed)
Patient has called back again to relay that her prescription for Gabapentin is "still not at [facility]". As front office staff relayed earlier today, chart note indicates done by Dr Macario Golds Friday, 01/01/22.  I called [facility], and per pharmacist Tammy, their electronic system went down, and said even though it is back up now, they still have not received the prescription as of yet. States she can accept a verbal if we can do. Please advise.

## 2022-01-08 ENCOUNTER — Ambulatory Visit: Payer: Medicare Other | Admitting: Physical Medicine and Rehabilitation

## 2022-01-08 ENCOUNTER — Ambulatory Visit: Payer: Self-pay

## 2022-01-08 ENCOUNTER — Encounter: Payer: Self-pay | Admitting: Physical Medicine and Rehabilitation

## 2022-01-08 ENCOUNTER — Other Ambulatory Visit: Payer: Self-pay

## 2022-01-08 VITALS — BP 134/87 | HR 77

## 2022-01-08 DIAGNOSIS — M4156 Other secondary scoliosis, lumbar region: Secondary | ICD-10-CM

## 2022-01-08 DIAGNOSIS — G8929 Other chronic pain: Secondary | ICD-10-CM | POA: Diagnosis not present

## 2022-01-08 DIAGNOSIS — M5116 Intervertebral disc disorders with radiculopathy, lumbar region: Secondary | ICD-10-CM | POA: Diagnosis not present

## 2022-01-08 DIAGNOSIS — M25551 Pain in right hip: Secondary | ICD-10-CM | POA: Diagnosis not present

## 2022-01-08 DIAGNOSIS — R1031 Right lower quadrant pain: Secondary | ICD-10-CM

## 2022-01-08 DIAGNOSIS — M5416 Radiculopathy, lumbar region: Secondary | ICD-10-CM

## 2022-01-08 NOTE — Progress Notes (Addendum)
Kathleen Pope - 88 y.o. female MRN [id number]  Date of birth: 08-27-1933  Office Visit Note: Visit Date: 01/08/2022 PCP: Redmond School, MD Referred by: Mordecai Rasmussen, MD  Subjective: Chief Complaint  Patient presents with   Right Hip - Pain   Right Leg - Pain   Right Foot - Pain, Numbness   HPI: Kathleen Pope is a 88 y.o. female who comes in today at the request of Dr. Larena Glassman for evaluation of right sided lower back pain that radiates to buttock, groin and down right leg to foot. Her most severe pain is right groin/hip. Patients daughter accompanying her during our visit today. Patient reports pain has been ongoing for several months, is exacerbated by movement/activity, walking and getting in and out of car. She describes pain as a constant sore and throbbing sensation, currently rates as 9 out of 10. Patient reports some relief of pain with rest and use of medications. Patient currently taking Gabapentin prescribed by Dr. Amedeo Kinsman and recently increased her dose to 200 mg TID. Patients recent lumbar MRI exhibits dextroscoliosis with apex at L2-3, large central disc extrusion with inferior migration at T12-L1 and severe right sided foraminal stenosis at L5-S1. No high grade spinal canal stenosis noted. Patients recent bilateral hip xray images exhibit mild bilateral hip arthritic changes. Patient states her walking has become significantly worse and is now requiring use of walker/cane. Patient states she feels like she can't put weight on right leg due to pain. Patient reports difficulty completing daily tasks due to severe pain. Patient denies focal weakness, numbness and tingling. Patient denies recent trauma or falls.   Review of Systems  Musculoskeletal:  Positive for back pain and joint pain.  Neurological:  Negative for tingling, sensory change, focal weakness and weakness.  All other systems reviewed and are negative. Otherwise per HPI.  Assessment & Plan: Visit Diagnoses:     ICD-10-CM   1. Pain in right hip  M25.551 XR C-ARM NO REPORT    Ambulatory referral to Physical Medicine Rehab    2. Groin pain, chronic, right  R10.31    G89.29     3. Lumbar radiculopathy  M54.16     4. Intervertebral disc disorders with radiculopathy, lumbar region  M51.16     5. Other secondary scoliosis, lumbar region  M41.56        Plan: Findings:  Chronic, worsening and severe right sided lower back pain radiating to buttock, groin and down leg to foot. Patient continues to have excruciating and disaffiliating pain despite good conservative therapies such as rest and use of medications. Patients clinical presentation and exam are consistent with both hip etiology and lumbar radiculopathy. She does have severe pain with internal/external rotation of right hip upon exam. We believe the next step is to perform a diagnostic and hopefully therapeutic right intra-articular hip injection under fluoroscopic guidance. If patient does not get good relief with right hip injection we would consider lumbar epidural steroid injection if warranted. Patient instructed to let us know how she is feeling next week. Patient encouraged to be active as tolerated. No red flag symptoms noted upon exam today.    Meds & Orders: No orders of the defined types were placed in this encounter.   Orders Placed This Encounter  Procedures   Large Joint Inj   XR C-ARM NO REPORT   Ambulatory referral to Physical Medicine Rehab    Follow-up: Return for Right intra-articular hip injection.   Procedures: Large  Joint Inj: R hip joint on 01/08/2022 9:30 AM Indications: diagnostic evaluation and pain Details: 22 G 3.5 in needle, fluoroscopy-guided anterior approach  Arthrogram: No  Medications: 4 mL bupivacaine 0.25 %; 60 mg triamcinolone acetonide 40 MG/ML Outcome: tolerated well, no immediate complications  There was excellent flow of contrast producing a partial arthrogram of the hip. The patient did have relief  of symptoms during the anesthetic phase of the injection. Procedure, treatment alternatives, risks and benefits explained, specific risks discussed. Consent was given by the patient. Immediately prior to procedure a time out was called to verify the correct patient, procedure, equipment, support staff and site/side marked as required. Patient was prepped and draped in the usual sterile fashion.         Clinical History: MRI LUMBAR SPINE WITHOUT CONTRAST   TECHNIQUE: Multiplanar, multisequence MR imaging of the lumbar spine was performed. No intravenous contrast was administered.   COMPARISON:  None.   FINDINGS: Segmentation:  Standard.   Alignment: Dextroscoliosis with apex at L2-3. Grade 1 anterolisthesis at L5-S1.   Vertebrae: No fracture, evidence of discitis, or bone lesion. There is Modic type 1 endplate change at O2-7.   Conus medullaris and cauda equina: Conus extends to the L1 level. Conus and cauda equina appear normal.   Paraspinal and other soft tissues: Negative.   Disc levels:   T12-L1: There is a large central disc extrusion with inferior migration that impinges the conus medullaris.   L1-L2: Small disc bulge with endplate spurring. No spinal canal stenosis. Mild left neural foraminal stenosis.   L2-L3: Mild facet hypertrophy with small disc bulge. No spinal canal stenosis. Moderate left neural foraminal stenosis.   L3-L4: Small disc bulge, right asymmetric. Right lateral recess narrowing without central spinal canal stenosis. Mild right neural foraminal stenosis.   L4-L5: Normal disc space and facet joints. No spinal canal stenosis. No neural foraminal stenosis.   L5-S1: Disc uncovering. No spinal canal stenosis. Severe right neural foraminal stenosis.   Visualized sacrum: Normal.   IMPRESSION: 1. Large central disc extrusion with inferior migration at T12-L1 that impinges on the conus medullaris. 2. Severe right L5-S1 neural foraminal  stenosis. 3. Moderate left L2-3 neural foraminal stenosis.     Electronically Signed   By: Ulyses Jarred M.D.   On: 12/30/2021 02:57   She reports that she has been smoking cigarettes. She has a 59.00 pack-year smoking history. She has never used smokeless tobacco.  Recent Labs    01/14/21 0905  HGBA1C 7.4*    Objective:  VS:  HT:     WT:    BMI:      BP:134/87   HR:77bpm   TEMP: ( )   RESP:  Physical Exam Vitals and nursing note reviewed.  HENT:     Head: Normocephalic and atraumatic.     Right Ear: External ear normal.     Left Ear: External ear normal.     Nose: Nose normal.     Mouth/Throat:     Mouth: Mucous membranes are moist.  Eyes:     Extraocular Movements: Extraocular movements intact.  Cardiovascular:     Rate and Rhythm: Normal rate.     Pulses: Normal pulses.  Pulmonary:     Effort: Pulmonary effort is normal.  Abdominal:     General: Abdomen is flat. There is no distension.  Musculoskeletal:        General: Tenderness present.     Cervical back: Normal range of motion.  Comments: Pt is slow to rise from seated position to standing. Good lumbar range of motion. Strong distal strength without clonus, no pain upon palpation of greater trochanters. Pain noted with internal/external rotation of right hip. Sensation intact bilaterally. Ambulates with walker/cane, gait unsteady.   Skin:    General: Skin is warm and dry.     Capillary Refill: Capillary refill takes less than 2 seconds.  Neurological:     Mental Status: She is alert and oriented to person, place, and time.     Gait: Gait abnormal.  Psychiatric:        Mood and Affect: Mood normal.        Behavior: Behavior normal.    Ortho Exam  Imaging: No results found.  Past Medical/Family/Surgical/Social History: Medications & Allergies reviewed per EMR, new medications updated. Patient Active Problem List   Diagnosis Date Noted   Hypokalemia 01/27/2021   Ileostomy in place [facility]) 01/24/2021    High output ileostomy ([place]) 01/24/2021   Protein-calorie malnutrition, severe 01/21/2021   Ischemic colitis with LGIB s/p colectomies & ileostomy 3/3 & 01/17/2021 01/19/2021   Fluid overload 01/19/2021   Hemorrhagic shock ([place]) 01/19/2021   Benign neoplasm of cecum    Benign neoplasm of ascending colon    Abnormal CT of the abdomen    Squamous cell carcinoma of skin of right middle finger 12/11/2020   Pleural effusion on right 11/27/2020   Loose stools 04/06/2017   De Quervain's disease (tenosynovitis) 03/14/2014   Hematochezia 03/09/2013   External hemorrhoids 03/08/2013   GI hemorrhage at cecal ulcer s/p colectomy/ileostomy 01/17/2021 08/18/2012   AVM (arteriovenous malformation) of colon 08/18/2012   HTN (hypertension) 08/18/2012   Acute blood loss anemia 08/18/2012   Bruit 08/04/2012   Mixed hyperlipidemia 08/04/2012   Cigarette smoker 08/04/2012   Murmur 08/04/2012   Past Medical History:  Diagnosis Date   Anemia    pt denies, yet takes iron   Chronic airway obstruction, not elsewhere classified    COPD (chronic obstructive pulmonary disease) ([place])    Diverticulosis    GI hemorrhage from post-treatment cecal ulcer 08/18/2012   Hemorrhoids    HTN (hypertension)    Hypercholesteremia    Lower GI bleed multiple   AVM's   Family History  Problem Relation Age of Onset   Breast cancer Mother    Lung cancer Father    Breast cancer Sister    Anuerysm Brother        in stomach   Colon cancer Neg Hx    Esophageal cancer Neg Hx    Rectal cancer Neg Hx    Past Surgical History:  Procedure Laterality Date   BUNIONECTOMY     left   COLECTOMY     COLON RESECTION N/A 01/17/2021   Procedure: EXPLORATORY LAPAROTOMY COMPLETION COLECTOMY ileostomy;  Surgeon: Ralene Ok, MD;  Location: [facility];  Service: General;  Laterality: N/A;   COLONOSCOPY  08/10/2012   Procedure: COLONOSCOPY;  Surgeon: Ladene Artist, MD,FACG;  Location: [facility];  Service: Endoscopy;  Laterality: N/A;    COLONOSCOPY  08/19/2012   Procedure: COLONOSCOPY;  Surgeon: Gatha Mayer, MD;  Location: [facility];  Service: Endoscopy;  Laterality: N/A;   COLONOSCOPY N/A 03/11/2013   Procedure: COLONOSCOPY;  Surgeon: Irene Shipper, MD;  Location: [facility];  Service: Endoscopy;  Laterality: N/A;   COLONOSCOPY N/A 01/16/2021   Procedure: COLONOSCOPY;  Surgeon: Doran Stabler, MD;  Location: [facility];  Service: Gastroenterology;  Laterality: N/A;  COLONOSCOPY WITH PROPOFOL N/A 01/10/2021   Procedure: COLONOSCOPY WITH PROPOFOL;  Surgeon: Irene Shipper, MD;  Location: [facility];  Service: Endoscopy;  Laterality: N/A;   COLOSTOMY N/A 01/15/2021   Procedure: COLOSTOMY;  Surgeon: Erroll Luna, MD;  Location: [facility];  Service: General;  Laterality: N/A;   ESOPHAGOGASTRODUODENOSCOPY (EGD) WITH PROPOFOL N/A 01/14/2021   Procedure: ESOPHAGOGASTRODUODENOSCOPY (EGD) WITH PROPOFOL;  Surgeon: Doran Stabler, MD;  Location: [facility];  Service: Endoscopy;  Laterality: N/A;   FLEXIBLE SIGMOIDOSCOPY N/A 01/14/2021   Procedure: FLEXIBLE SIGMOIDOSCOPY;  Surgeon: Doran Stabler, MD;  Location: [facility];  Service: Endoscopy;  Laterality: N/A;   HOT HEMOSTASIS N/A 01/10/2021   Procedure: HOT HEMOSTASIS (ARGON PLASMA COAGULATION/BICAP);  Surgeon: Irene Shipper, MD;  Location: [facility];  Service: Endoscopy;  Laterality: N/A;   IR ANGIOGRAM SELECTIVE EACH ADDITIONAL VESSEL  01/12/2021   IR ANGIOGRAM SELECTIVE EACH ADDITIONAL VESSEL  01/12/2021   IR ANGIOGRAM SELECTIVE EACH ADDITIONAL VESSEL  01/12/2021   IR ANGIOGRAM VISCERAL SELECTIVE  01/09/2021   IR ANGIOGRAM VISCERAL SELECTIVE  01/12/2021   IR EMBO ARTERIAL NOT HEMORR HEMANG INC GUIDE ROADMAPPING  01/12/2021   IR US GUIDE VASC ACCESS RIGHT  01/09/2021   IR US GUIDE VASC ACCESS RIGHT  01/12/2021   PARTIAL COLECTOMY N/A 01/15/2021   Procedure: PARTIAL COLECTOMY;  Surgeon: Erroll Luna, MD;  Location: [facility];  Service: General;  Laterality: N/A;    POLYPECTOMY  01/10/2021   Procedure: POLYPECTOMY;  Surgeon: Irene Shipper, MD;  Location: [facility];  Service: Endoscopy;;   TONSILLECTOMY     Social History   Occupational History   Occupation: retired    Fish farm manager: RETIRED  Tobacco Use   Smoking status: Every Day    Packs/day: 1.00    Years: 59.00    Pack years: 59.00    Types: Cigarettes   Smokeless tobacco: Never   Tobacco comments:    tobacco info given 06/09/18  Vaping Use   Vaping Use: Never used  Substance and Sexual Activity   Alcohol use: No   Drug use: No   Sexual activity: Never

## 2022-01-08 NOTE — Progress Notes (Signed)
Pt state right groin and hip pain that also travels down her leg to her foot. Pt state she has numbness in her right foot. Pt state walking makes the pain worse. Pt state she takes pain meds to help ease her pain.  Numeric Pain Rating Scale and Functional Assessment Average Pain 10 Pain Right Now 10 My pain is constant, sharp, burning, and stabbing Pain is worse with: walking and some activites Pain improves with: rest and medication   In the last MONTH (on 0-10 scale) has pain interfered with the following?  1. General activity like being  able to carry out your everyday physical activities such as walking, climbing stairs, carrying groceries, or moving a chair?  Rating(7)  2. Relation with others like being able to carry out your usual social activities and roles such as  activities at home, at work and in your community. Rating(8)  3. Enjoyment of life such that you have  been bothered by emotional problems such as feeling anxious, depressed or irritable?  Rating(9)

## 2022-01-12 MED ORDER — BUPIVACAINE HCL 0.25 % IJ SOLN
4.0000 mL | INTRAMUSCULAR | Status: AC | PRN
  Administered 2022-01-08: 4 mL via INTRA_ARTICULAR

## 2022-01-12 MED ORDER — TRIAMCINOLONE ACETONIDE 40 MG/ML IJ SUSP
60.0000 mg | INTRAMUSCULAR | Status: AC | PRN
  Administered 2022-01-08: 60 mg via INTRA_ARTICULAR

## 2022-01-13 ENCOUNTER — Other Ambulatory Visit: Payer: Self-pay | Admitting: Physical Medicine and Rehabilitation

## 2022-01-13 ENCOUNTER — Telehealth: Payer: Self-pay | Admitting: Physical Medicine and Rehabilitation

## 2022-01-13 DIAGNOSIS — M5416 Radiculopathy, lumbar region: Secondary | ICD-10-CM

## 2022-01-13 NOTE — Telephone Encounter (Signed)
Pt niece called and states that injection she got on 02/24 di not work for her at all. They would like someone to call them  ? ?CB [phone] ?

## 2022-01-14 ENCOUNTER — Other Ambulatory Visit: Payer: Self-pay | Admitting: Orthopedic Surgery

## 2022-01-14 NOTE — Telephone Encounter (Signed)
Spoke with pt contact who states she's not aware of any concerns with medication. It looks like the pharmacy is trying to get the pt a 90 day supply, but it was not requested by patient.  ?

## 2022-01-14 NOTE — Telephone Encounter (Signed)
Patient called and request refill on  ? ?gabapentin (NEURONTIN) 100 MG capsule ? ?Pharmacy:  [place]   ?

## 2022-01-16 ENCOUNTER — Other Ambulatory Visit: Payer: Self-pay | Admitting: Orthopedic Surgery

## 2022-01-18 ENCOUNTER — Telehealth: Payer: Self-pay | Admitting: Orthopedic Surgery

## 2022-01-18 MED ORDER — GABAPENTIN 100 MG PO CAPS: 200.0000 mg | ORAL_CAPSULE | Freq: Three times a day (TID) | ORAL | 0 refills | Status: DC

## 2022-01-18 NOTE — Telephone Encounter (Signed)
Patient called and is requesting a refill on her pain medicine.  She states she has called [place] a couple of times.  I do not show a telephone encounter about it, and she said the pharmacy has called [place] too.  She called the pharmacy again this morning and they told her they will call her once they get it.  ? ?She wants to get [place] to call Dr. Amedeo Kinsman, I advised her I'm sorry Dr. Amedeo Kinsman is in surgery today.  ? ?She is wanting this handled FIRST thing in the morning.  ? ?She ran out of her medicine last Friday and the Pharmacy gave her some to get thru the weekend only.  ? ?I advised her Dr. Amedeo Kinsman will be back in the office tomorrow and she said she is completely out!! And she wants me to send it thru and I told her I can't do that Dr. Amedeo Kinsman will have to do that.  ? ?gabapentin (NEURONTIN) 100 MG capsule ? ?Pharmacy:  [place]  ?

## 2022-01-21 ENCOUNTER — Encounter: Payer: Self-pay | Admitting: Physical Medicine and Rehabilitation

## 2022-01-21 ENCOUNTER — Ambulatory Visit (INDEPENDENT_AMBULATORY_CARE_PROVIDER_SITE_OTHER): Payer: Medicare Other | Admitting: Physical Medicine and Rehabilitation

## 2022-01-21 ENCOUNTER — Other Ambulatory Visit: Payer: Self-pay

## 2022-01-21 ENCOUNTER — Ambulatory Visit: Payer: Self-pay

## 2022-01-21 DIAGNOSIS — M5416 Radiculopathy, lumbar region: Secondary | ICD-10-CM | POA: Diagnosis not present

## 2022-01-21 MED ORDER — METHYLPREDNISOLONE ACETATE 80 MG/ML IJ SUSP
80.0000 mg | Freq: Once | INTRAMUSCULAR | Status: AC
  Administered 2022-01-21: 80 mg

## 2022-01-21 NOTE — Progress Notes (Signed)
Pt state right groin and hip pain that also travels down her leg to her foot. Pt state she has numbness in her right foot. Pt state walking makes the pain worse. Pt state she takes pain meds to help ease her pain. ? ?Numeric Pain Rating Scale and Functional Assessment ?Average Pain 9 ? ? ?In the last MONTH (on 0-10 scale) has pain interfered with the following? ? ?1. General activity like being  able to carry out your everyday physical activities such as walking, climbing stairs, carrying groceries, or moving a chair?  ?Rating(10) ? ? ?+Driver, -BT, -Dye Allergies. ? ?

## 2022-01-21 NOTE — Patient Instructions (Signed)

## 2022-01-29 ENCOUNTER — Other Ambulatory Visit: Payer: Self-pay | Admitting: Orthopedic Surgery

## 2022-01-29 MED ORDER — GABAPENTIN 100 MG PO CAPS: 200.0000 mg | ORAL_CAPSULE | Freq: Three times a day (TID) | ORAL | 0 refills | Status: DC

## 2022-01-29 NOTE — Telephone Encounter (Signed)
Refill request sent to provider.

## 2022-01-29 NOTE — Telephone Encounter (Signed)
Patient requests refill:  ?gabapentin (NEURONTIN) 100 MG capsule 90 capsule  ?     Kathleen Pope ?

## 2022-02-01 NOTE — Procedures (Signed)
Lumbar Epidural Steroid Injection - Interlaminar Approach with Fluoroscopic Guidance ? ?Patient: Kathleen Pope      ?Date of Birth: August 09, 1933 ?MRN: [id number] ?PCP: Redmond School, MD      ?Visit Date: 01/21/2022 ?  ?Universal Protocol:    ? ?Consent Given By: the patient ? ?Position: PRONE ? ?Additional Comments: ?Vital signs were monitored before and after the procedure. ?Patient was prepped and draped in the usual sterile fashion. ?The correct patient, procedure, and site was verified. ? ? ?Injection Procedure Details:  ? ?Procedure diagnoses: Lumbar radiculopathy [M54.16]  ? ?Meds Administered:  ?Meds ordered this encounter  ?Medications  ? methylPREDNISolone acetate (DEPO-MEDROL) injection 80 mg  ?  ? ?Laterality: Right ? ?Location/Site:  L3-4 ? ?Needle: 3.5 in., 20 ga. Tuohy ? ?Needle Placement: Paramedian epidural ? ?Findings:  ? -Comments: Excellent flow of contrast into the epidural space. ? ?Procedure Details: ?Using a paramedian approach from the side mentioned above, the region overlying the inferior lamina was localized under fluoroscopic visualization and the soft tissues overlying this structure were infiltrated with 4 ml. of 1% Lidocaine without Epinephrine. The Tuohy needle was inserted into the epidural space using a paramedian approach.  ? ?The epidural space was localized using loss of resistance along with counter oblique bi-planar fluoroscopic views.  After negative aspirate for air, blood, and CSF, a 2 ml. volume of Isovue-250 was injected into the epidural space and the flow of contrast was observed. Radiographs were obtained for documentation purposes.   ? ?The injectate was administered into the level noted above. ? ? ?Additional Comments:  ?The patient tolerated the procedure well ?Dressing: 2 x 2 sterile gauze and Band-Aid ?  ? ?Post-procedure details: ?Patient was observed during the procedure. ?Post-procedure instructions were reviewed. ? ?Patient left the clinic in stable condition.  ?

## 2022-02-01 NOTE — Progress Notes (Signed)
? ?SHAIVI ROTHSCHILD - 88 y.o. female MRN [id number]  Date of birth: May 25, 1933 ? ?Office Visit Note: ?Visit Date: 01/21/2022 ?PCP: Redmond School, MD ?Referred by: Redmond School, MD ? ?Subjective: ?Chief Complaint  ?Patient presents with  ? Right Hip - Pain  ? Right Leg - Pain  ? Right Foot - Pain  ? ?HPI:  Kathleen Pope is a 88 y.o. female who comes in today at the request of Barnet Pall, FNP and Dr. Larena Glassman for planned Right L3-4 Lumbar Interlaminar epidural steroid injection with fluoroscopic guidance.  The patient has failed conservative care including home exercise, medications, time and activity modification.  This injection will be diagnostic and hopefully therapeutic.  Please see requesting physician notes for further details and justification.  Diagnostic hip injection did not give her any relief.  She comes in today for planned epidural injection.  Interestingly she has this fairly significant disc herniation at T12-L1 that would explain the groin type pain that she was having but she is getting more L5/S1 radicular symptoms.  The disc herniation does take up some space around the conus and she could be getting some referral pattern of nerve roots lower down.  We will have her return depending on relief and look at a transforaminal injection at L1.  Her case is complicated by multiple medical issues and her age. ? ?ROS Otherwise per HPI. ? ?Assessment & Plan: ?Visit Diagnoses:  ?  ICD-10-CM   ?1. Lumbar radiculopathy  M54.16 XR C-ARM NO REPORT  ?  Epidural Steroid injection  ?  methylPREDNISolone acetate (DEPO-MEDROL) injection 80 mg  ?  ?  ?Plan: No additional findings.  ? ?Meds & Orders:  ?Meds ordered this encounter  ?Medications  ? methylPREDNISolone acetate (DEPO-MEDROL) injection 80 mg  ?  ?Orders Placed This Encounter  ?Procedures  ? XR C-ARM NO REPORT  ? Epidural Steroid injection  ?  ?Follow-up: Return in about 2 weeks (around 02/04/2022).  ? ?Procedures: ?No procedures performed  ?Lumbar  Epidural Steroid Injection - Interlaminar Approach with Fluoroscopic Guidance ? ?Patient: Kathleen Pope      ?Date of Birth: 01/28/1933 ?MRN: [id number] ?PCP: Redmond School, MD      ?Visit Date: 01/21/2022 ?  ?Universal Protocol:    ? ?Consent Given By: the patient ? ?Position: PRONE ? ?Additional Comments: ?Vital signs were monitored before and after the procedure. ?Patient was prepped and draped in the usual sterile fashion. ?The correct patient, procedure, and site was verified. ? ? ?Injection Procedure Details:  ? ?Procedure diagnoses: Lumbar radiculopathy [M54.16]  ? ?Meds Administered:  ?Meds ordered this encounter  ?Medications  ? methylPREDNISolone acetate (DEPO-MEDROL) injection 80 mg  ?  ? ?Laterality: Right ? ?Location/Site:  L3-4 ? ?Needle: 3.5 in., 20 ga. Tuohy ? ?Needle Placement: Paramedian epidural ? ?Findings:  ? -Comments: Excellent flow of contrast into the epidural space. ? ?Procedure Details: ?Using a paramedian approach from the side mentioned above, the region overlying the inferior lamina was localized under fluoroscopic visualization and the soft tissues overlying this structure were infiltrated with 4 ml. of 1% Lidocaine without Epinephrine. The Tuohy needle was inserted into the epidural space using a paramedian approach.  ? ?The epidural space was localized using loss of resistance along with counter oblique bi-planar fluoroscopic views.  After negative aspirate for air, blood, and CSF, a 2 ml. volume of Isovue-250 was injected into the epidural space and the flow of contrast was observed. Radiographs were obtained for documentation purposes.   ? ?  The injectate was administered into the level noted above. ? ? ?Additional Comments:  ?The patient tolerated the procedure well ?Dressing: 2 x 2 sterile gauze and Band-Aid ?  ? ?Post-procedure details: ?Patient was observed during the procedure. ?Post-procedure instructions were reviewed. ? ?Patient left the clinic in stable condition.    ? ?Clinical History: ?MRI LUMBAR SPINE WITHOUT CONTRAST  ?   ?TECHNIQUE:  ?Multiplanar, multisequence MR imaging of the lumbar spine was  ?performed. No intravenous contrast was administered.  ?   ?COMPARISON:  None.  ?   ?FINDINGS:  ?Segmentation:  Standard.  ?   ?Alignment: Dextroscoliosis with apex at L2-3. Grade 1  ?anterolisthesis at L5-S1.  ?   ?Vertebrae: No fracture, evidence of discitis, or bone lesion. There  ?is Modic type 1 endplate change at U3-1.  ?   ?Conus medullaris and cauda equina: Conus extends to the L1 level.  ?Conus and cauda equina appear normal.  ?   ?Paraspinal and other soft tissues: Negative.  ?   ?Disc levels:  ?   ?T12-L1: There is a large central disc extrusion with inferior  ?migration that impinges the conus medullaris.  ?   ?L1-L2: Small disc bulge with endplate spurring. No spinal canal  ?stenosis. Mild left neural foraminal stenosis.  ?   ?L2-L3: Mild facet hypertrophy with small disc bulge. No spinal canal  ?stenosis. Moderate left neural foraminal stenosis.  ?   ?L3-L4: Small disc bulge, right asymmetric. Right lateral recess  ?narrowing without central spinal canal stenosis. Mild right neural  ?foraminal stenosis.  ?   ?L4-L5: Normal disc space and facet joints. No spinal canal stenosis.  ?No neural foraminal stenosis.  ?   ?L5-S1: Disc uncovering. No spinal canal stenosis. Severe right  ?neural foraminal stenosis.  ?   ?Visualized sacrum: Normal.  ?   ?IMPRESSION:  ?1. Large central disc extrusion with inferior migration at T12-L1  ?that impinges on the conus medullaris.  ?2. Severe right L5-S1 neural foraminal stenosis.  ?3. Moderate left L2-3 neural foraminal stenosis.  ?   ?   ?Electronically Signed  ?  By: Ulyses Jarred M.D.  ?  On: 12/30/2021 02:57  ? ? ? ?Objective:  VS:  HT:    WT:   BMI:     BP:   HR: bpm  TEMP: ( )  RESP:  ?Physical Exam ?Vitals and nursing note reviewed.  ?Constitutional:   ?   General: She is not in acute distress. ?   Appearance: Normal  appearance. She is not ill-appearing.  ?HENT:  ?   Head: Normocephalic and atraumatic.  ?   Right Ear: External ear normal.  ?   Left Ear: External ear normal.  ?Eyes:  ?   Extraocular Movements: Extraocular movements intact.  ?Cardiovascular:  ?   Rate and Rhythm: Normal rate.  ?   Pulses: Normal pulses.  ?Pulmonary:  ?   Effort: Pulmonary effort is normal. No respiratory distress.  ?Abdominal:  ?   General: There is no distension.  ?   Palpations: Abdomen is soft.  ?Musculoskeletal:     ?   General: Tenderness present.  ?   Cervical back: Neck supple.  ?   Right lower leg: No edema.  ?   Left lower leg: No edema.  ?   Comments: Patient has good distal strength with no pain over the greater trochanters.  No clonus or focal weakness.  ?Skin: ?   Findings: No erythema, lesion or rash.  ?  Neurological:  ?   General: No focal deficit present.  ?   Mental Status: She is alert and oriented to person, place, and time.  ?   Sensory: No sensory deficit.  ?   Motor: No weakness or abnormal muscle tone.  ?   Coordination: Coordination normal.  ?Psychiatric:     ?   Mood and Affect: Mood normal.     ?   Behavior: Behavior normal.  ?  ? ?Imaging: ?No results found. ?

## 2022-02-02 ENCOUNTER — Telehealth: Payer: Self-pay | Admitting: Physical Medicine and Rehabilitation

## 2022-02-02 DIAGNOSIS — M48062 Spinal stenosis, lumbar region with neurogenic claudication: Secondary | ICD-10-CM

## 2022-02-02 DIAGNOSIS — M5116 Intervertebral disc disorders with radiculopathy, lumbar region: Secondary | ICD-10-CM

## 2022-02-02 DIAGNOSIS — M5416 Radiculopathy, lumbar region: Secondary | ICD-10-CM

## 2022-02-02 NOTE — Telephone Encounter (Signed)
Patient called. says the injection helped some. Still in pain. Her call back number is [phone] ?

## 2022-02-04 ENCOUNTER — Telehealth: Payer: Self-pay | Admitting: Physical Medicine and Rehabilitation

## 2022-02-04 NOTE — Telephone Encounter (Signed)
Pt's niece Ulis Rias called about a referral sent by Dr. Ernestina Patches. Please call about this matter at [phone]. ?

## 2022-02-10 ENCOUNTER — Other Ambulatory Visit: Payer: Self-pay | Admitting: Radiology

## 2022-02-10 MED ORDER — GABAPENTIN 100 MG PO CAPS: 200.0000 mg | ORAL_CAPSULE | Freq: Three times a day (TID) | ORAL | 0 refills | Status: DC

## 2022-02-10 NOTE — Telephone Encounter (Signed)
Patient is asking if you will refill gabapentin, she does not see Dr Ernestina Patches until 03/04/22.   ? ?[place]. ?

## 2022-02-10 NOTE — Telephone Encounter (Signed)
Patient requesting refill gabapentin, [facility].  ?

## 2022-02-22 ENCOUNTER — Other Ambulatory Visit: Payer: Self-pay | Admitting: Orthopedic Surgery

## 2022-02-22 ENCOUNTER — Other Ambulatory Visit: Payer: Self-pay

## 2022-02-22 NOTE — Telephone Encounter (Signed)
Can you refill this? Dr. Amedeo Kinsman patient ?

## 2022-02-22 NOTE — Telephone Encounter (Signed)
Patient is asking for refill ? ?Gabapentin 100 MG  ? ?Take 2 capsules(200 mg total) by mouth 3(three) times daily ? ?PATIENT USES [facility] ?

## 2022-02-22 NOTE — Telephone Encounter (Signed)
Is there any way to put these prescription requests and the other format is much easier to refill and to do it through this format

## 2022-02-23 ENCOUNTER — Telehealth: Payer: Self-pay | Admitting: Orthopedic Surgery

## 2022-02-23 MED ORDER — GABAPENTIN 100 MG PO CAPS: 200.0000 mg | ORAL_CAPSULE | Freq: Three times a day (TID) | ORAL | 0 refills | Status: DC

## 2022-02-23 NOTE — Telephone Encounter (Signed)
ERROR

## 2022-02-23 NOTE — Telephone Encounter (Signed)
Refill request sent to covering provider.  ?

## 2022-03-04 ENCOUNTER — Ambulatory Visit: Payer: Self-pay

## 2022-03-04 ENCOUNTER — Encounter: Payer: Self-pay | Admitting: Physical Medicine and Rehabilitation

## 2022-03-04 ENCOUNTER — Ambulatory Visit (INDEPENDENT_AMBULATORY_CARE_PROVIDER_SITE_OTHER): Payer: Medicare Other | Admitting: Physical Medicine and Rehabilitation

## 2022-03-04 DIAGNOSIS — M5416 Radiculopathy, lumbar region: Secondary | ICD-10-CM

## 2022-03-04 MED ORDER — GABAPENTIN 300 MG PO CAPS: 300.0000 mg | ORAL_CAPSULE | Freq: Four times a day (QID) | ORAL | 3 refills | Status: DC

## 2022-03-04 MED ORDER — DEXAMETHASONE SODIUM PHOSPHATE 10 MG/ML IJ SOLN
15.0000 mg | Freq: Once | INTRAMUSCULAR | Status: AC
  Administered 2022-03-04: 15 mg

## 2022-03-04 NOTE — Progress Notes (Signed)
Pt state lower back pain that also travels down her leg to her foot. Pt state she has numbness in her right foot. Pt state walking makes the pain worse. Pt state she takes pain meds to help ease her pain. ? ?Numeric Pain Rating Scale and Functional Assessment ?Average Pain 2 ? ? ?In the last MONTH (on 0-10 scale) has pain interfered with the following? ? ?1. General activity like being  able to carry out your everyday physical activities such as walking, climbing stairs, carrying groceries, or moving a chair?  ?Rating(9) ? ? ?+Driver, -BT, -Dye Allergies. ? ?

## 2022-03-04 NOTE — Patient Instructions (Signed)

## 2022-03-16 NOTE — Progress Notes (Signed)
? ?Kathleen Pope - 88 y.o. female MRN [id number]  Date of birth: 11/25/1932 ? ?Office Visit Note: ?Visit Date: 03/04/2022 ?PCP: Redmond School, MD ?Referred by: Redmond School, MD ? ?Subjective: ?Chief Complaint  ?Patient presents with  ? Lower Back - Pain  ? Right Leg - Pain  ? Right Foot - Pain  ? ?HPI:  Kathleen Pope is a 88 y.o. female who comes in today for planned repeat Right L1-2  Lumbar Transforaminal epidural steroid injection with fluoroscopic guidance.  The patient has failed conservative care including home exercise, medications, time and activity modification.  This injection will be diagnostic and hopefully therapeutic.  Please see requesting physician notes for further details and justification. Patient received more than 50% pain relief from prior injection.  ? ?Referring: Dr. Larena Glassman  ? ?ROS Otherwise per HPI. ? ?Assessment & Plan: ?Visit Diagnoses:  ?  ICD-10-CM   ?1. Lumbar radiculopathy  M54.16 XR C-ARM NO REPORT  ?  Epidural Steroid injection  ?  dexamethasone (DECADRON) injection 15 mg  ?  ?  ?Plan: No additional findings.  ? ?Meds & Orders:  ?Meds ordered this encounter  ?Medications  ? dexamethasone (DECADRON) injection 15 mg  ? gabapentin (NEURONTIN) 300 MG capsule  ?  Sig: Take 1 capsule (300 mg total) by mouth 4 (four) times daily.  ?  Dispense:  120 capsule  ?  Refill:  3  ?  ?Orders Placed This Encounter  ?Procedures  ? XR C-ARM NO REPORT  ? Epidural Steroid injection  ?  ?Follow-up: Return if symptoms worsen or fail to improve.  ? ?Procedures: ?No procedures performed  ?Lumbosacral Transforaminal Epidural Steroid Injection - Sub-Pedicular Approach with Fluoroscopic Guidance ? ?Patient: Kathleen Pope      ?Date of Birth: 10-10-1933 ?MRN: [id number] ?PCP: Redmond School, MD      ?Visit Date: 03/04/2022 ?  ?Universal Protocol:    ?Date/Time: 03/04/2022 ? ?Consent Given By: the patient ? ?Position: PRONE ? ?Additional Comments: ?Vital signs were monitored before and after the  procedure. ?Patient was prepped and draped in the usual sterile fashion. ?The correct patient, procedure, and site was verified. ? ? ?Injection Procedure Details:  ? ?Procedure diagnoses: Lumbar radiculopathy [M54.16]   ? ?Meds Administered:  ?Meds ordered this encounter  ?Medications  ? dexamethasone (DECADRON) injection 15 mg  ? gabapentin (NEURONTIN) 300 MG capsule  ?  Sig: Take 1 capsule (300 mg total) by mouth 4 (four) times daily.  ?  Dispense:  120 capsule  ?  Refill:  3  ? ? ?Laterality: Right ? ?Location/Site: L1 ? ?Needle:5.0 in., 22 ga.  Short bevel or Quincke spinal needle ? ?Needle Placement: Transforaminal ? ?Findings: ?  ? -Comments: Excellent flow of contrast along the nerve, nerve root and into the epidural space. ? ?Procedure Details: ?After squaring off the end-plates to get a true AP view, the C-arm was positioned so that an oblique view of the foramen as noted above was visualized. The target area is just inferior to the "nose of the scotty dog" or sub pedicular. The soft tissues overlying this structure were infiltrated with 2-3 ml. of 1% Lidocaine without Epinephrine. ? ?The spinal needle was inserted toward the target using a "trajectory" view along the fluoroscope beam.  Under AP and lateral visualization, the needle was advanced so it did not puncture dura and was located close the 6 O'Clock position of the pedical in AP tracterory. Biplanar projections were used to confirm position. Aspiration was  confirmed to be negative for CSF and/or blood. A 1-2 ml. volume of Isovue-250 was injected and flow of contrast was noted at each level. Radiographs were obtained for documentation purposes.  ? ?After attaining the desired flow of contrast documented above, a 0.5 to 1.0 ml test dose of 0.25% Marcaine was injected into each respective transforaminal space.  The patient was observed for 90 seconds post injection.  After no sensory deficits were reported, and normal lower extremity motor function  was noted,   the above injectate was administered so that equal amounts of the injectate were placed at each foramen (level) into the transforaminal epidural space. ? ? ?Additional Comments:  ?The patient tolerated the procedure well ?Dressing: 2 x 2 sterile gauze and Band-Aid ?  ? ?Post-procedure details: ?Patient was observed during the procedure. ?Post-procedure instructions were reviewed. ? ?Patient left the clinic in stable condition. ?   ? ?Clinical History: ?MRI LUMBAR SPINE WITHOUT CONTRAST  ?   ?TECHNIQUE:  ?Multiplanar, multisequence MR imaging of the lumbar spine was  ?performed. No intravenous contrast was administered.  ?   ?COMPARISON:  None.  ?   ?FINDINGS:  ?Segmentation:  Standard.  ?   ?Alignment: Dextroscoliosis with apex at L2-3. Grade 1  ?anterolisthesis at L5-S1.  ?   ?Vertebrae: No fracture, evidence of discitis, or bone lesion. There  ?is Modic type 1 endplate change at K0-9.  ?   ?Conus medullaris and cauda equina: Conus extends to the L1 level.  ?Conus and cauda equina appear normal.  ?   ?Paraspinal and other soft tissues: Negative.  ?   ?Disc levels:  ?   ?T12-L1: There is a large central disc extrusion with inferior  ?migration that impinges the conus medullaris.  ?   ?L1-L2: Small disc bulge with endplate spurring. No spinal canal  ?stenosis. Mild left neural foraminal stenosis.  ?   ?L2-L3: Mild facet hypertrophy with small disc bulge. No spinal canal  ?stenosis. Moderate left neural foraminal stenosis.  ?   ?L3-L4: Small disc bulge, right asymmetric. Right lateral recess  ?narrowing without central spinal canal stenosis. Mild right neural  ?foraminal stenosis.  ?   ?L4-L5: Normal disc space and facet joints. No spinal canal stenosis.  ?No neural foraminal stenosis.  ?   ?L5-S1: Disc uncovering. No spinal canal stenosis. Severe right  ?neural foraminal stenosis.  ?   ?Visualized sacrum: Normal.  ?   ?IMPRESSION:  ?1. Large central disc extrusion with inferior migration at T12-L1  ?that  impinges on the conus medullaris.  ?2. Severe right L5-S1 neural foraminal stenosis.  ?3. Moderate left L2-3 neural foraminal stenosis.  ?   ?   ?Electronically Signed  ?  By: Ulyses Jarred M.D.  ?  On: 12/30/2021 02:57  ? ? ? ?Objective:  VS:  HT:    WT:   BMI:     BP:   HR: bpm  TEMP: ( )  RESP:  ?Physical Exam ?Vitals and nursing note reviewed.  ?Constitutional:   ?   General: She is not in acute distress. ?   Appearance: Normal appearance. She is not ill-appearing.  ?HENT:  ?   Head: Normocephalic and atraumatic.  ?   Right Ear: External ear normal.  ?   Left Ear: External ear normal.  ?Eyes:  ?   Extraocular Movements: Extraocular movements intact.  ?Cardiovascular:  ?   Rate and Rhythm: Normal rate.  ?   Pulses: Normal pulses.  ?Pulmonary:  ?  Effort: Pulmonary effort is normal. No respiratory distress.  ?Abdominal:  ?   General: There is no distension.  ?   Palpations: Abdomen is soft.  ?Musculoskeletal:     ?   General: Tenderness present.  ?   Cervical back: Neck supple.  ?   Right lower leg: No edema.  ?   Left lower leg: No edema.  ?   Comments: Patient has good distal strength with no pain over the greater trochanters.  No clonus or focal weakness. Mild pain with hip rotation. Good ROM.  ?Skin: ?   Findings: No erythema, lesion or rash.  ?Neurological:  ?   General: No focal deficit present.  ?   Mental Status: She is alert and oriented to person, place, and time.  ?   Sensory: No sensory deficit.  ?   Motor: No weakness or abnormal muscle tone.  ?   Coordination: Coordination normal.  ?Psychiatric:     ?   Mood and Affect: Mood normal.     ?   Behavior: Behavior normal.  ?  ? ?Imaging: ?No results found. ?

## 2022-03-16 NOTE — Procedures (Signed)
Lumbosacral Transforaminal Epidural Steroid Injection - Sub-Pedicular Approach with Fluoroscopic Guidance ? ?Patient: Kathleen Pope      ?Date of Birth: Feb 13, 1933 ?MRN: [id number] ?PCP: Redmond School, MD      ?Visit Date: 03/04/2022 ?  ?Universal Protocol:    ?Date/Time: 03/04/2022 ? ?Consent Given By: the patient ? ?Position: PRONE ? ?Additional Comments: ?Vital signs were monitored before and after the procedure. ?Patient was prepped and draped in the usual sterile fashion. ?The correct patient, procedure, and site was verified. ? ? ?Injection Procedure Details:  ? ?Procedure diagnoses: Lumbar radiculopathy [M54.16]   ? ?Meds Administered:  ?Meds ordered this encounter  ?Medications  ? dexamethasone (DECADRON) injection 15 mg  ? gabapentin (NEURONTIN) 300 MG capsule  ?  Sig: Take 1 capsule (300 mg total) by mouth 4 (four) times daily.  ?  Dispense:  120 capsule  ?  Refill:  3  ? ? ?Laterality: Right ? ?Location/Site: L1 ? ?Needle:5.0 in., 22 ga.  Short bevel or Quincke spinal needle ? ?Needle Placement: Transforaminal ? ?Findings: ?  ? -Comments: Excellent flow of contrast along the nerve, nerve root and into the epidural space. ? ?Procedure Details: ?After squaring off the end-plates to get a true AP view, the C-arm was positioned so that an oblique view of the foramen as noted above was visualized. The target area is just inferior to the "nose of the scotty dog" or sub pedicular. The soft tissues overlying this structure were infiltrated with 2-3 ml. of 1% Lidocaine without Epinephrine. ? ?The spinal needle was inserted toward the target using a "trajectory" view along the fluoroscope beam.  Under AP and lateral visualization, the needle was advanced so it did not puncture dura and was located close the 6 O'Clock position of the pedical in AP tracterory. Biplanar projections were used to confirm position. Aspiration was confirmed to be negative for CSF and/or blood. A 1-2 ml. volume of Isovue-250 was injected and  flow of contrast was noted at each level. Radiographs were obtained for documentation purposes.  ? ?After attaining the desired flow of contrast documented above, a 0.5 to 1.0 ml test dose of 0.25% Marcaine was injected into each respective transforaminal space.  The patient was observed for 90 seconds post injection.  After no sensory deficits were reported, and normal lower extremity motor function was noted,   the above injectate was administered so that equal amounts of the injectate were placed at each foramen (level) into the transforaminal epidural space. ? ? ?Additional Comments:  ?The patient tolerated the procedure well ?Dressing: 2 x 2 sterile gauze and Band-Aid ?  ? ?Post-procedure details: ?Patient was observed during the procedure. ?Post-procedure instructions were reviewed. ? ?Patient left the clinic in stable condition. ?  ?

## 2022-05-12 ENCOUNTER — Inpatient Hospital Stay (HOSPITAL_BASED_OUTPATIENT_CLINIC_OR_DEPARTMENT_OTHER): Payer: Medicare Other

## 2022-05-12 ENCOUNTER — Inpatient Hospital Stay (HOSPITAL_COMMUNITY): Payer: Medicare Other

## 2022-05-12 ENCOUNTER — Encounter (HOSPITAL_COMMUNITY): Payer: Self-pay

## 2022-05-12 ENCOUNTER — Other Ambulatory Visit: Payer: Self-pay

## 2022-05-12 ENCOUNTER — Other Ambulatory Visit (HOSPITAL_COMMUNITY): Payer: Self-pay | Admitting: *Deleted

## 2022-05-12 ENCOUNTER — Observation Stay (HOSPITAL_COMMUNITY)
Admission: EM | Admit: 2022-05-12 | Discharge: 2022-05-13 | Disposition: A | Discharge status: Home / Self Care | Payer: Medicare Other | Attending: Internal Medicine | Admitting: Internal Medicine

## 2022-05-12 ENCOUNTER — Emergency Department (HOSPITAL_COMMUNITY): Payer: Medicare Other

## 2022-05-12 DIAGNOSIS — Z66 Do not resuscitate: Secondary | ICD-10-CM | POA: Insufficient documentation

## 2022-05-12 DIAGNOSIS — J441 Chronic obstructive pulmonary disease with (acute) exacerbation: Secondary | ICD-10-CM | POA: Diagnosis not present

## 2022-05-12 DIAGNOSIS — E119 Type 2 diabetes mellitus without complications: Secondary | ICD-10-CM | POA: Insufficient documentation

## 2022-05-12 DIAGNOSIS — J90 Pleural effusion, not elsewhere classified: Secondary | ICD-10-CM | POA: Diagnosis not present

## 2022-05-12 DIAGNOSIS — K5520 Angiodysplasia of colon without hemorrhage: Secondary | ICD-10-CM

## 2022-05-12 DIAGNOSIS — I509 Heart failure, unspecified: Secondary | ICD-10-CM | POA: Diagnosis not present

## 2022-05-12 DIAGNOSIS — E871 Hypo-osmolality and hyponatremia: Secondary | ICD-10-CM | POA: Insufficient documentation

## 2022-05-12 DIAGNOSIS — Z79899 Other long term (current) drug therapy: Secondary | ICD-10-CM | POA: Diagnosis not present

## 2022-05-12 DIAGNOSIS — E8770 Fluid overload, unspecified: Secondary | ICD-10-CM | POA: Insufficient documentation

## 2022-05-12 DIAGNOSIS — R011 Cardiac murmur, unspecified: Secondary | ICD-10-CM | POA: Insufficient documentation

## 2022-05-12 DIAGNOSIS — I5031 Acute diastolic (congestive) heart failure: Secondary | ICD-10-CM

## 2022-05-12 DIAGNOSIS — Z7984 Long term (current) use of oral hypoglycemic drugs: Secondary | ICD-10-CM | POA: Diagnosis not present

## 2022-05-12 DIAGNOSIS — J948 Other specified pleural conditions: Secondary | ICD-10-CM | POA: Diagnosis not present

## 2022-05-12 DIAGNOSIS — J811 Chronic pulmonary edema: Secondary | ICD-10-CM | POA: Diagnosis not present

## 2022-05-12 DIAGNOSIS — E43 Unspecified severe protein-calorie malnutrition: Secondary | ICD-10-CM | POA: Insufficient documentation

## 2022-05-12 DIAGNOSIS — F17210 Nicotine dependence, cigarettes, uncomplicated: Secondary | ICD-10-CM | POA: Insufficient documentation

## 2022-05-12 DIAGNOSIS — E782 Mixed hyperlipidemia: Secondary | ICD-10-CM | POA: Insufficient documentation

## 2022-05-12 DIAGNOSIS — Q2739 Arteriovenous malformation, other site: Secondary | ICD-10-CM | POA: Diagnosis not present

## 2022-05-12 DIAGNOSIS — I5033 Acute on chronic diastolic (congestive) heart failure: Secondary | ICD-10-CM | POA: Diagnosis not present

## 2022-05-12 DIAGNOSIS — I110 Hypertensive heart disease with heart failure: Secondary | ICD-10-CM | POA: Diagnosis not present

## 2022-05-12 DIAGNOSIS — R0602 Shortness of breath: Secondary | ICD-10-CM | POA: Diagnosis present

## 2022-05-12 DIAGNOSIS — J9601 Acute respiratory failure with hypoxia: Principal | ICD-10-CM | POA: Insufficient documentation

## 2022-05-12 DIAGNOSIS — I10 Essential (primary) hypertension: Secondary | ICD-10-CM | POA: Diagnosis present

## 2022-05-12 DIAGNOSIS — Z7982 Long term (current) use of aspirin: Secondary | ICD-10-CM | POA: Insufficient documentation

## 2022-05-12 DIAGNOSIS — Z20822 Contact with and (suspected) exposure to covid-19: Secondary | ICD-10-CM | POA: Insufficient documentation

## 2022-05-12 DIAGNOSIS — J9811 Atelectasis: Secondary | ICD-10-CM | POA: Diagnosis not present

## 2022-05-12 DIAGNOSIS — Z681 Body mass index (BMI) 19 or less, adult: Secondary | ICD-10-CM | POA: Diagnosis not present

## 2022-05-12 DIAGNOSIS — I5020 Unspecified systolic (congestive) heart failure: Secondary | ICD-10-CM

## 2022-05-12 DIAGNOSIS — R0902 Hypoxemia: Secondary | ICD-10-CM | POA: Diagnosis not present

## 2022-05-12 HISTORY — DX: Acute respiratory failure with hypoxia: J96.01

## 2022-05-12 LAB — GRAM STAIN

## 2022-05-12 LAB — ECHOCARDIOGRAM COMPLETE
AR max vel: 1.09 cm2
AV Area VTI: 1.09 cm2
AV Area mean vel: 1.07 cm2
AV Mean grad: 38 mmHg
AV Peak grad: 65 mmHg
Ao pk vel: 4.03 m/s
Area-P 1/2: 1.35 cm2
Height: 60 in
P 1/2 time: 362 msec
S' Lateral: 1.8 cm
Weight: 1488 oz

## 2022-05-12 LAB — TROPONIN I (HIGH SENSITIVITY)
Troponin I (High Sensitivity): 8 ng/L (ref <18)
Troponin I (High Sensitivity): 6 ng/L (ref <18)

## 2022-05-12 LAB — BODY FLUID CELL COUNT WITH DIFFERENTIAL
Eos, Fluid: 0 %
Lymphs, Fluid: 52 %
Monocyte-Macrophage-Serous Fluid: 33 % — ABNORMAL LOW (ref 50–90)
Neutrophil Count, Fluid: 15 % (ref 0–25)
Total Nucleated Cell Count, Fluid: 563 cu mm (ref 0–1000)

## 2022-05-12 LAB — GLUCOSE, PLEURAL OR PERITONEAL FLUID: Glucose, Fluid: 163 mg/dL

## 2022-05-12 LAB — CBC WITH DIFFERENTIAL/PLATELET
Abs Immature Granulocytes: 0.04 10*3/uL (ref 0.00–0.07)
Basophils Absolute: 0 10*3/uL (ref 0.0–0.1)
Basophils Relative: 0 %
Eosinophils Absolute: 0.5 10*3/uL (ref 0.0–0.5)
Eosinophils Relative: 4 %
HCT: 37.1 % (ref 36.0–46.0)
Hemoglobin: 13 g/dL (ref 12.0–15.0)
Immature Granulocytes: 0 %
Lymphocytes Relative: 14 %
Lymphs Abs: 1.5 10*3/uL (ref 0.7–4.0)
MCH: 33.6 pg (ref 26.0–34.0)
MCHC: 35 g/dL (ref 30.0–36.0)
MCV: 95.9 fL (ref 80.0–100.0)
Monocytes Absolute: 1 10*3/uL (ref 0.1–1.0)
Monocytes Relative: 9 %
Neutro Abs: 7.8 10*3/uL — ABNORMAL HIGH (ref 1.7–7.7)
Neutrophils Relative %: 73 %
Platelets: 416 10*3/uL — ABNORMAL HIGH (ref 150–400)
RBC: 3.87 MIL/uL (ref 3.87–5.11)
RDW: 14.1 % (ref 11.5–15.5)
WBC: 10.8 10*3/uL — ABNORMAL HIGH (ref 4.0–10.5)
nRBC: 0 % (ref 0.0–0.2)

## 2022-05-12 LAB — GLUCOSE, CAPILLARY: Glucose-Capillary: 265 mg/dL — ABNORMAL HIGH (ref 70–99)

## 2022-05-12 LAB — CREATININE, SERUM
Creatinine, Ser: 0.73 mg/dL (ref 0.44–1.00)
GFR, Estimated: >60 mL/min (ref >60)

## 2022-05-12 LAB — COMPREHENSIVE METABOLIC PANEL
ALT: 13 U/L (ref 0–44)
AST: 21 U/L (ref 15–41)
Albumin: 3.8 g/dL (ref 3.5–5.0)
Alkaline Phosphatase: 62 U/L (ref 38–126)
Anion gap: 9 (ref 5–15)
BUN: 13 mg/dL (ref 8–23)
CO2: 25 mmol/L (ref 22–32)
Calcium: 9.5 mg/dL (ref 8.9–10.3)
Chloride: 99 mmol/L (ref 98–111)
Creatinine, Ser: 0.73 mg/dL (ref 0.44–1.00)
GFR, Estimated: >60 mL/min (ref >60)
Glucose, Bld: 112 mg/dL — ABNORMAL HIGH (ref 70–99)
Potassium: 3.9 mmol/L (ref 3.5–5.1)
Sodium: 133 mmol/L — ABNORMAL LOW (ref 135–145)
Total Bilirubin: 0.8 mg/dL (ref 0.3–1.2)
Total Protein: 7.4 g/dL (ref 6.5–8.1)

## 2022-05-12 LAB — CBC
HCT: 40.1 % (ref 36.0–46.0)
Hemoglobin: 13.6 g/dL (ref 12.0–15.0)
MCH: 32.7 pg (ref 26.0–34.0)
MCHC: 33.9 g/dL (ref 30.0–36.0)
MCV: 96.4 fL (ref 80.0–100.0)
Platelets: 439 10*3/uL — ABNORMAL HIGH (ref 150–400)
RBC: 4.16 MIL/uL (ref 3.87–5.11)
RDW: 13.9 % (ref 11.5–15.5)
WBC: 10.7 10*3/uL — ABNORMAL HIGH (ref 4.0–10.5)
nRBC: 0 % (ref 0.0–0.2)

## 2022-05-12 LAB — TSH: TSH: 0.827 u[IU]/mL (ref 0.350–4.500)

## 2022-05-12 LAB — SARS CORONAVIRUS 2 BY RT PCR: SARS Coronavirus 2 by RT PCR: NEGATIVE

## 2022-05-12 LAB — BRAIN NATRIURETIC PEPTIDE: B Natriuretic Peptide: 555 pg/mL — ABNORMAL HIGH (ref 0.0–100.0)

## 2022-05-12 LAB — ALBUMIN, PLEURAL OR PERITONEAL FLUID: Albumin, Fluid: <1.5 g/dL

## 2022-05-12 LAB — PROCALCITONIN: Procalcitonin: <0.1 ng/mL

## 2022-05-12 LAB — OSMOLALITY: Osmolality: 293 mOsm/kg (ref 275–295)

## 2022-05-12 MED ORDER — PRAVASTATIN SODIUM 10 MG PO TABS
10.0000 mg | ORAL_TABLET | Freq: Every day | ORAL | Status: DC
  Administered 2022-05-12: 10 mg via ORAL
  Filled 2022-05-12: qty 1

## 2022-05-12 MED ORDER — CALCIUM CARBONATE 1250 (500 CA) MG PO TABS
1250.0000 mg | ORAL_TABLET | Freq: Every day | ORAL | Status: DC
  Administered 2022-05-12 – 2022-05-13 (×2): 1250 mg via ORAL
  Filled 2022-05-12 (×2): qty 1

## 2022-05-12 MED ORDER — SODIUM CHLORIDE 0.9% FLUSH: 3.0000 mL | INTRAVENOUS | Status: DC | PRN

## 2022-05-12 MED ORDER — GABAPENTIN 300 MG PO CAPS
300.0000 mg | ORAL_CAPSULE | Freq: Four times a day (QID) | ORAL | Status: DC
  Administered 2022-05-12 – 2022-05-13 (×3): 300 mg via ORAL
  Filled 2022-05-12 (×3): qty 1

## 2022-05-12 MED ORDER — ONDANSETRON HCL 4 MG/2ML IJ SOLN: 4.0000 mg | Freq: Four times a day (QID) | INTRAMUSCULAR | Status: DC | PRN

## 2022-05-12 MED ORDER — AMLODIPINE BESYLATE 5 MG PO TABS
5.0000 mg | ORAL_TABLET | Freq: Every day | ORAL | Status: DC
  Administered 2022-05-13: 5 mg via ORAL
  Filled 2022-05-12: qty 1

## 2022-05-12 MED ORDER — ALBUTEROL SULFATE (2.5 MG/3ML) 0.083% IN NEBU
2.5000 mg | INHALATION_SOLUTION | Freq: Once | RESPIRATORY_TRACT | Status: AC
  Administered 2022-05-12: 2.5 mg via RESPIRATORY_TRACT
  Filled 2022-05-12: qty 3

## 2022-05-12 MED ORDER — ACETAMINOPHEN 650 MG RE SUPP: 650.0000 mg | Freq: Four times a day (QID) | RECTAL | Status: DC | PRN

## 2022-05-12 MED ORDER — IPRATROPIUM-ALBUTEROL 0.5-2.5 (3) MG/3ML IN SOLN: 3.0000 mL | Freq: Four times a day (QID) | RESPIRATORY_TRACT | Status: DC | PRN

## 2022-05-12 MED ORDER — OMEGA-3-ACID ETHYL ESTERS 1 G PO CAPS
1.0000 g | ORAL_CAPSULE | Freq: Every day | ORAL | Status: DC
  Administered 2022-05-13: 1 g via ORAL
  Filled 2022-05-12: qty 1

## 2022-05-12 MED ORDER — METHYLPREDNISOLONE SODIUM SUCC 125 MG IJ SOLR
125.0000 mg | Freq: Once | INTRAMUSCULAR | Status: AC
  Administered 2022-05-12: 125 mg via INTRAVENOUS
  Filled 2022-05-12: qty 2

## 2022-05-12 MED ORDER — FERROUS SULFATE 325 (65 FE) MG PO TABS
325.0000 mg | ORAL_TABLET | Freq: Two times a day (BID) | ORAL | Status: DC
  Administered 2022-05-12 – 2022-05-13 (×2): 325 mg via ORAL
  Filled 2022-05-12 (×6): qty 1

## 2022-05-12 MED ORDER — METOPROLOL TARTRATE 25 MG PO TABS
12.5000 mg | ORAL_TABLET | Freq: Two times a day (BID) | ORAL | Status: DC
  Administered 2022-05-12 – 2022-05-13 (×2): 12.5 mg via ORAL
  Filled 2022-05-12 (×2): qty 1

## 2022-05-12 MED ORDER — ONDANSETRON HCL 4 MG PO TABS: 4.0000 mg | ORAL_TABLET | Freq: Four times a day (QID) | ORAL | Status: DC | PRN

## 2022-05-12 MED ORDER — LIDOCAINE HCL (PF) 2 % IJ SOLN
INTRAMUSCULAR | Status: AC
  Administered 2022-05-12: 10 mL
  Filled 2022-05-12: qty 10

## 2022-05-12 MED ORDER — SODIUM CHLORIDE 0.9% FLUSH
3.0000 mL | Freq: Two times a day (BID) | INTRAVENOUS | Status: DC
  Administered 2022-05-12 – 2022-05-13 (×3): 3 mL via INTRAVENOUS

## 2022-05-12 MED ORDER — ACETAMINOPHEN 325 MG PO TABS: 650.0000 mg | ORAL_TABLET | Freq: Four times a day (QID) | ORAL | Status: DC | PRN

## 2022-05-12 MED ORDER — SODIUM CHLORIDE 0.9 % IV SOLN: 250.0000 mL | INTRAVENOUS | Status: DC | PRN

## 2022-05-12 MED ORDER — MAGNESIUM SULFATE 2 GM/50ML IV SOLN
2.0000 g | Freq: Once | INTRAVENOUS | Status: AC
  Administered 2022-05-12: 2 g via INTRAVENOUS
  Filled 2022-05-12: qty 50

## 2022-05-12 MED ORDER — ENOXAPARIN SODIUM 40 MG/0.4ML IJ SOSY
40.0000 mg | PREFILLED_SYRINGE | INTRAMUSCULAR | Status: DC
  Administered 2022-05-12: 40 mg via SUBCUTANEOUS
  Filled 2022-05-12: qty 0.4

## 2022-05-12 MED ORDER — MELATONIN 3 MG PO TABS
6.0000 mg | ORAL_TABLET | Freq: Once | ORAL | Status: AC
  Administered 2022-05-12: 6 mg via ORAL
  Filled 2022-05-12: qty 2

## 2022-05-12 MED ORDER — FUROSEMIDE 10 MG/ML IJ SOLN
20.0000 mg | Freq: Two times a day (BID) | INTRAMUSCULAR | Status: DC
  Administered 2022-05-12 – 2022-05-13 (×2): 20 mg via INTRAVENOUS
  Filled 2022-05-12 (×2): qty 2

## 2022-05-12 MED ORDER — IPRATROPIUM-ALBUTEROL 0.5-2.5 (3) MG/3ML IN SOLN
3.0000 mL | Freq: Once | RESPIRATORY_TRACT | Status: AC
  Administered 2022-05-12: 3 mL via RESPIRATORY_TRACT
  Filled 2022-05-12: qty 3

## 2022-05-12 MED ORDER — FUROSEMIDE 10 MG/ML IJ SOLN
20.0000 mg | Freq: Once | INTRAMUSCULAR | Status: AC
  Administered 2022-05-12: 20 mg via INTRAVENOUS
  Filled 2022-05-12: qty 2

## 2022-05-12 NOTE — Progress Notes (Signed)
*  PRELIMINARY RESULTS* Echocardiogram 2D Echocardiogram has been performed.  Samuel Germany 05/12/2022, 3:47 PM

## 2022-05-12 NOTE — H&P (Addendum)
History and Physical    JILLIANE KAZANJIAN CXK:[id number] DOB: 12-07-1932 DOA: 05/12/2022  PCP: Redmond School, MD   Patient coming from: Home  Chief Complaint: Dyspnea  HPI: Kathleen Pope is a 88 y.o. female with medical history significant for COPD, tobacco abuse, hypertension, dyslipidemia, and prior GI bleed due to AVMs who presented to the ED today with complaints of some weakness and shortness of breath that began approximately 2 days ago.  This was accompanied by some orthopnea.  She states that she has had a nonproductive cough, but denies any fevers, chills, chest pain, abdominal pain, nausea, or vomiting.   ED Course: Vital signs stable and patient afebrile, however she desaturates into the upper 80th percentile on room air and is requiring 2-3 L nasal cannula.  Leukocytosis of 10,800 noted and sodium 133 with BNP 555.  EKG with no acute abnormalities and troponins are not elevated.  No tachypnea or respiratory distress noted.  Chest x-ray with moderate pulmonary edema as well as moderate left-sided pleural effusion and small right-sided effusions noted.  She was started on some Lasix and given breathing treatments as well as steroids.  Review of Systems: Reviewed as noted above, otherwise negative.  Past Medical History:  Diagnosis Date   Anemia    pt denies, yet takes iron   Chronic airway obstruction, not elsewhere classified    COPD (chronic obstructive pulmonary disease) ([place])    Diverticulosis    GI hemorrhage from post-treatment cecal ulcer 08/18/2012   Hemorrhoids    HTN (hypertension)    Hypercholesteremia    Lower GI bleed multiple   AVM's    Past Surgical History:  Procedure Laterality Date   BUNIONECTOMY     left   COLECTOMY     COLON RESECTION N/A 01/17/2021   Procedure: EXPLORATORY LAPAROTOMY COMPLETION COLECTOMY ileostomy;  Surgeon: Ralene Ok, MD;  Location: [facility];  Service: General;  Laterality: N/A;   COLONOSCOPY  08/10/2012   Procedure: COLONOSCOPY;   Surgeon: Ladene Artist, MD,FACG;  Location: [facility];  Service: Endoscopy;  Laterality: N/A;   COLONOSCOPY  08/19/2012   Procedure: COLONOSCOPY;  Surgeon: Gatha Mayer, MD;  Location: [facility];  Service: Endoscopy;  Laterality: N/A;   COLONOSCOPY N/A 03/11/2013   Procedure: COLONOSCOPY;  Surgeon: Irene Shipper, MD;  Location: [facility];  Service: Endoscopy;  Laterality: N/A;   COLONOSCOPY N/A 01/16/2021   Procedure: COLONOSCOPY;  Surgeon: Doran Stabler, MD;  Location: [facility];  Service: Gastroenterology;  Laterality: N/A;   COLONOSCOPY WITH PROPOFOL N/A 01/10/2021   Procedure: COLONOSCOPY WITH PROPOFOL;  Surgeon: Irene Shipper, MD;  Location: [facility];  Service: Endoscopy;  Laterality: N/A;   COLOSTOMY N/A 01/15/2021   Procedure: COLOSTOMY;  Surgeon: Erroll Luna, MD;  Location: [facility];  Service: General;  Laterality: N/A;   ESOPHAGOGASTRODUODENOSCOPY (EGD) WITH PROPOFOL N/A 01/14/2021   Procedure: ESOPHAGOGASTRODUODENOSCOPY (EGD) WITH PROPOFOL;  Surgeon: Doran Stabler, MD;  Location: [facility];  Service: Endoscopy;  Laterality: N/A;   FLEXIBLE SIGMOIDOSCOPY N/A 01/14/2021   Procedure: FLEXIBLE SIGMOIDOSCOPY;  Surgeon: Doran Stabler, MD;  [facility]: [facility];  Service: Endoscopy;  Laterality: N/A;   HOT HEMOSTASIS N/A 01/10/2021   Procedure: HOT HEMOSTASIS (ARGON PLASMA COAGULATION/BICAP);  Surgeon: Irene Shipper, MD;  Location: [facility];  Service: Endoscopy;  Laterality: N/A;   IR ANGIOGRAM SELECTIVE EACH ADDITIONAL VESSEL  01/12/2021   IR ANGIOGRAM SELECTIVE EACH ADDITIONAL VESSEL  01/12/2021   IR ANGIOGRAM SELECTIVE  EACH ADDITIONAL VESSEL  01/12/2021   IR ANGIOGRAM VISCERAL SELECTIVE  01/09/2021   IR ANGIOGRAM VISCERAL SELECTIVE  01/12/2021   IR EMBO ARTERIAL NOT HEMORR HEMANG INC GUIDE ROADMAPPING  01/12/2021   IR US GUIDE VASC ACCESS RIGHT  01/09/2021   IR US GUIDE VASC ACCESS RIGHT  01/12/2021   PARTIAL COLECTOMY N/A 01/15/2021   Procedure: PARTIAL  COLECTOMY;  Surgeon: Erroll Luna, MD;  Location: [facility];  Service: General;  Laterality: N/A;   POLYPECTOMY  01/10/2021   Procedure: POLYPECTOMY;  Surgeon: Irene Shipper, MD;  Location: [facility];  Service: Endoscopy;;   TONSILLECTOMY       reports that she has been smoking cigarettes. She has a 59.00 pack-year smoking history. She has never used smokeless tobacco. She reports that she does not drink alcohol and does not use drugs.  No Known Allergies  Family History  Problem Relation Age of Onset   Breast cancer Mother    Lung cancer Father    Breast cancer Sister    Anuerysm Brother        in stomach   Colon cancer Neg Hx    Esophageal cancer Neg Hx    Rectal cancer Neg Hx     Prior to Admission medications   Medication Sig Start Date End Date Taking? Authorizing Provider  acetaminophen (TYLENOL) 500 MG tablet Take 2 tablets (1,000 mg total) by mouth every 6 (six) hours as needed. Patient taking differently: Take 1,000 mg by mouth every 6 (six) hours as needed for mild pain. 01/29/21  Yes Saverio Danker, PA-C  amLODipine (NORVASC) 5 MG tablet Take 5 mg by mouth daily. 03/22/22  Yes [provider]  calcium carbonate (OS-CAL) 600 MG TABS Take 600 mg by mouth daily.   Yes [provider]  ECHINACEA PO Take 1 capsule by mouth daily.   Yes [provider]  ferrous sulfate 325 (65 FE) MG EC tablet Take 325 mg by mouth 2 (two) times daily.    Yes [provider]  gabapentin (NEURONTIN) 300 MG capsule Take 1 capsule (300 mg total) by mouth 4 (four) times daily. 03/04/22  Yes Magnus Sinning, MD  losartan (COZAAR) 50 MG tablet Take 50 mg by mouth daily. 03/22/22  Yes [provider]  metoprolol tartrate (LOPRESSOR) 25 MG tablet Take 0.5 tablets (12.5 mg total) by mouth 2 (two) times daily. 01/25/21  Yes Rai, Ripudeep K, MD  Omega-3 Fatty Acids (FISH OIL PO) Take 1 tablet by mouth daily.   Yes [provider]  pravastatin (PRAVACHOL) 10  MG tablet Take 10 mg by mouth daily.   Yes [provider]    Physical Exam: Vitals:   05/12/22 1015 05/12/22 1030 05/12/22 1100 05/12/22 1130  BP: (!) 173/90 (!) 157/80 140/73 (!) 143/77  Pulse: 77 83 77 83  Resp: 13 15 16 16   Temp:      TempSrc:      SpO2: 94% 94% 92% 95%  Weight:      Height:        Constitutional: NAD, calm, comfortable Vitals:   05/12/22 1015 05/12/22 1030 05/12/22 1100 05/12/22 1130  BP: (!) 173/90 (!) 157/80 140/73 (!) 143/77  Pulse: 77 83 77 83  Resp: 13 15 16 16   Temp:      TempSrc:      SpO2: 94% 94% 92% 95%  Weight:      Height:       Eyes: lids and conjunctivae normal Neck: normal, supple  Respiratory: Diminished bilaterally, no wheezing.  Currently on 2 L nasal cannula. Cardiovascular: Regular rate and rhythm, holosystolic murmur noted. Abdomen: no tenderness, no distention. Bowel sounds positive.  Musculoskeletal:  No edema. Skin: no rashes, lesions, ulcers.  Psychiatric: Flat affect  Labs on Admission: I have personally reviewed following labs and imaging studies  CBC: Recent Labs  Lab 05/12/22 0815  WBC 10.8*  NEUTROABS 7.8*  HGB 13.0  HCT 37.1  MCV 95.9  PLT 517*   Basic Metabolic Panel: Recent Labs  Lab 05/12/22 0815  NA 133*  K 3.9  CL 99  CO2 25  GLUCOSE 112*  BUN 13  CREATININE 0.73  CALCIUM 9.5   GFR: Estimated Creatinine Clearance: 33 mL/min (by C-G formula based on SCr of 0.73 mg/dL). Liver Function Tests: Recent Labs  Lab 05/12/22 0815  AST 21  ALT 13  ALKPHOS 62  BILITOT 0.8  PROT 7.4  ALBUMIN 3.8   No results for input(s): "LIPASE", "AMYLASE" in the last 168 hours. No results for input(s): "AMMONIA" in the last 168 hours. Coagulation Profile: No results for input(s): "INR", "PROTIME" in the last 168 hours. Cardiac Enzymes: No results for input(s): "CKTOTAL", "CKMB", "CKMBINDEX", "TROPONINI" in the last 168 hours. BNP (last 3 results) No results for input(s): "PROBNP" in the last 8760  hours. HbA1C: No results for input(s): "HGBA1C" in the last 72 hours. CBG: No results for input(s): "GLUCAP" in the last 168 hours. Lipid Profile: No results for input(s): "CHOL", "HDL", "LDLCALC", "TRIG", "CHOLHDL", "LDLDIRECT" in the last 72 hours. Thyroid Function Tests: No results for input(s): "TSH", "T4TOTAL", "FREET4", "T3FREE", "THYROIDAB" in the last 72 hours. Anemia Panel: No results for input(s): "VITAMINB12", "FOLATE", "FERRITIN", "TIBC", "IRON", "RETICCTPCT" in the last 72 hours. Urine analysis:    Component Value Date/Time   COLORURINE YELLOW 03/09/2013 2028   APPEARANCEUR CLEAR 03/09/2013 2028   LABSPEC 1.036 (H) 03/09/2013 2028   PHURINE 6.5 03/09/2013 2028   GLUCOSEU 500 (A) 03/09/2013 2028   HGBUR NEGATIVE 03/09/2013 2028   BILIRUBINUR NEGATIVE 03/09/2013 2028   KETONESUR 15 (A) 03/09/2013 2028   PROTEINUR NEGATIVE 03/09/2013 2028   UROBILINOGEN 1.0 03/09/2013 2028   NITRITE NEGATIVE 03/09/2013 2028   LEUKOCYTESUR NEGATIVE 03/09/2013 2028    Radiological Exams on Admission: DG Chest Port 1 View  Result Date: 05/12/2022 CLINICAL DATA:  Shortness of breath, weakness EXAM: PORTABLE CHEST 1 VIEW COMPARISON:  Radiograph 01/17/2021 FINDINGS: Unchanged cardiomediastinal silhouette. There are diffuse interstitial and mid to lower lung predominant airspace opacities. Moderate left and small right pleural effusions, with dense left basilar consolidation. No pneumothorax. No acute osseous abnormality. IMPRESSION: Moderate pulmonary edema with moderate left and small right pleural effusions and adjacent basilar atelectasis. Electronically Signed   By: Maurine Simmering M.D.   On: 05/12/2022 08:15    EKG: Independently reviewed. SR 75bpm.  Assessment/Plan Principal Problem:   Acute hypoxemic respiratory failure (HCC) Active Problems:   Mixed hyperlipidemia   Cigarette smoker   Murmur   AVM (arteriovenous malformation) of colon   HTN (hypertension)   Protein-calorie  malnutrition, severe    Acute hypoxemic respiratory failure secondary to volume overload with bilateral pleural effusions -Does not wear home O2, wean as tolerated -Check 2D echocardiogram -Continue diuresis with IV Lasix 20 mg twice daily -Strict I's and O's -Daily weights -Check Reds clip data  Mild hyponatremia -Likely secondary to volume overload -Continue to monitor while undergoing diuresis -Check TSH, urine, serum osmolarity  Leukocytosis -Check procalcitonin -Monitor CBC and avoid antibiotics  for now as there is no suspicion of infection  History of COPD -Without acute exacerbation -DuoNebs as needed for shortness of breath or wheezing -Given IV Solu-Medrol in ED  Type 2 diabetes -SSI/carb modified diet  Hypertension -Continue to monitor while on diuresis -Continue all home medications except ARB  Dyslipidemia -Continue statin  Severe protein calorie malnutrition -BMI 18.16 -Dietitian consultation  DVT prophylaxis: Lovenox Code Status: Full Family Communication: None at bedside Disposition Plan:Admit for diuresis Consults called:None Admission status: Inpatient, Tele  Severity of Illness: The appropriate patient status for this patient is INPATIENT. Inpatient status is judged to be reasonable and necessary in order to provide the required intensity of service to ensure the patient's safety. The patient's presenting symptoms, physical exam findings, and initial radiographic and laboratory data in the context of their chronic comorbidities is felt to place them at high risk for further clinical deterioration. Furthermore, it is not anticipated that the patient will be medically stable for discharge from the hospital within 2 midnights of admission.   * I certify that at the point of admission it is my clinical judgment that the patient will require inpatient hospital care spanning beyond 2 midnights from the point of admission due to high intensity of service,  high risk for further deterioration and high frequency of surveillance required.*   Kayonna Lawniczak D Jakaya Jacobowitz DO [facility]  If 7PM-7AM, please contact night-coverage [email or web address]  05/12/2022, 11:57 AM

## 2022-05-12 NOTE — ED Notes (Signed)
Dr Roderic Palau came in and discussed plan of care. Told to turn off O2 for now, to see it pt can keep O2 levels up.  O2 turned off per Dr order

## 2022-05-12 NOTE — ED Notes (Signed)
Pt placed on 4 lt NS because SpO2 dropped to 82 when returning from the bathroom. Nurse notified

## 2022-05-12 NOTE — Procedures (Signed)
PreOperative Dx: LEFT pleural effusion Postoperative Dx: LEFT pleural effusion Procedure:   US guided LEFT thoracentesis Radiologist:  Thornton Papas Anesthesia:  10 ml of 1% lidocaine Specimen:  1.1 L of yellow colored fluid EBL:   < 1 ml Complications: None

## 2022-05-12 NOTE — ED Notes (Signed)
Spoke with Dr Roderic Palau, orders to keep O2 sats 90 or above

## 2022-05-12 NOTE — ED Notes (Signed)
Sent note to Dr Roderic Palau- Pt has COPD O2 sats 89-91 after breathing treatment and on O2- [place]; how high do you want me to go up on O2? Or go to non-rebreather?

## 2022-05-12 NOTE — ED Provider Notes (Signed)
[facility] EMERGENCY DEPARTMENT Provider Note   CSN: [id number] Arrival date & time: 05/12/22  0744     History  Chief Complaint  Patient presents with   Shortness of Breath    Kathleen Pope is a 88 y.o. female.  Patient complains of shortness of breath.  She has a history of COPD.  She does not use oxygen at home  The history is provided by the patient and medical records. No language interpreter was used.  Shortness of Breath Severity:  Moderate Onset quality:  Sudden Timing:  Constant Progression:  Worsening Chronicity:  Recurrent Context: activity   Relieved by:  Nothing Worsened by:  Nothing Ineffective treatments:  None tried Associated symptoms: no abdominal pain, no chest pain, no cough, no headaches and no rash        Home Medications Prior to Admission medications   Medication Sig Start Date End Date Taking? Authorizing Provider  acetaminophen (TYLENOL) 500 MG tablet Take 2 tablets (1,000 mg total) by mouth every 6 (six) hours as needed. 01/29/21   Saverio Danker, PA-C  aspirin EC 81 MG tablet Take 1 tablet (81 mg total) by mouth every other day. 01/17/21   Nita Sells, MD  budesonide (ENTOCORT EC) 3 MG 24 hr capsule Take 9mg  for one month; then 6 mg for one month; then 3 mg for one month; then stop 03/12/20   Irene Shipper, MD  calcium carbonate (OS-CAL) 600 MG TABS Take 600 mg by mouth daily.    [provider]  ECHINACEA PO Take 1 capsule by mouth daily.    [provider]  ferrous sulfate 325 (65 FE) MG EC tablet Take 325 mg by mouth 2 (two) times daily.     [provider]  gabapentin (NEURONTIN) 300 MG capsule Take 1 capsule (300 mg total) by mouth 4 (four) times daily. 03/04/22   Magnus Sinning, MD  loperamide (IMODIUM) 2 MG capsule Take 2 capsules (4 mg total) by mouth 2 (two) times daily. 01/29/21   Saverio Danker, PA-C  metFORMIN (GLUCOPHAGE) 500 MG tablet Take 1 tablet (500 mg total) by mouth daily with breakfast.  01/25/21 01/25/22  Rai, Vernelle Emerald, MD  metoprolol tartrate (LOPRESSOR) 25 MG tablet Take 0.5 tablets (12.5 mg total) by mouth 2 (two) times daily. 01/25/21   Rai, Ripudeep K, MD  oxyCODONE (OXY IR/ROXICODONE) 5 MG immediate release tablet Take 1 tablet (5 mg total) by mouth every 6 (six) hours as needed for moderate pain. 01/29/21   Saverio Danker, PA-C  polycarbophil (FIBERCON) 625 MG tablet Take 1 tablet (625 mg total) by mouth 2 (two) times daily. 01/29/21   Saverio Danker, PA-C  potassium chloride SA (KLOR-CON) 20 MEQ tablet Take 1 tablet (20 mEq total) by mouth daily. 01/30/21   Saverio Danker, PA-C  pravastatin (PRAVACHOL) 10 MG tablet Take 10 mg by mouth daily.    [provider]      Allergies    Patient has no known allergies.    Review of Systems   Review of Systems  Constitutional:  Negative for appetite change and fatigue.  HENT:  Negative for congestion, ear discharge and sinus pressure.   Eyes:  Negative for discharge.  Respiratory:  Positive for shortness of breath. Negative for cough.   Cardiovascular:  Negative for chest pain.  Gastrointestinal:  Negative for abdominal pain and diarrhea.  Genitourinary:  Negative for frequency and hematuria.  Musculoskeletal:  Negative for back pain.  Skin:  Negative for rash.  Neurological:  Negative for seizures and headaches.  Psychiatric/Behavioral:  Negative for hallucinations.     Physical Exam Updated Vital Signs BP 140/73 Comment: [place] 4 L/M  Pulse 77 Comment: [place] 4 L/M  Temp 98.1 F (36.7 C) (Oral)   Resp 16 Comment: [place] 4 L/M  Ht 5' (1.524 m)   Wt 42.2 kg   SpO2 92% Comment:  4 L/M  BMI 18.16 kg/m  Physical Exam Vitals and nursing note reviewed.  Constitutional:      Appearance: She is well-developed.  HENT:     Head: Normocephalic.     Nose: Nose normal.  Eyes:     General: No scleral icterus.    Conjunctiva/sclera: Conjunctivae normal.  Neck:     Thyroid: No thyromegaly.  Cardiovascular:     Rate and  Rhythm: Normal rate and regular rhythm.     Heart sounds: No murmur heard.    No friction rub. No gallop.  Pulmonary:     Breath sounds: No stridor. Wheezing present. No rales.  Chest:     Chest wall: No tenderness.  Abdominal:     General: There is no distension.     Tenderness: There is no abdominal tenderness. There is no rebound.  Musculoskeletal:        General: Normal range of motion.     Cervical back: Neck supple.  Lymphadenopathy:     Cervical: No cervical adenopathy.  Skin:    Findings: No erythema or rash.  Neurological:     Mental Status: She is alert and oriented to person, place, and time.     Motor: No abnormal muscle tone.     Coordination: Coordination normal.  Psychiatric:        Behavior: Behavior normal.     ED Results / Procedures / Treatments   Labs (all labs ordered are listed, but only abnormal results are displayed) Labs Reviewed  CBC WITH DIFFERENTIAL/PLATELET - Abnormal; Notable for the following components:      Result Value   WBC 10.8 (*)    Platelets 416 (*)    Neutro Abs 7.8 (*)    All other components within normal limits  COMPREHENSIVE METABOLIC PANEL - Abnormal; Notable for the following components:   Sodium 133 (*)    Glucose, Bld 112 (*)    All other components within normal limits  BRAIN NATRIURETIC PEPTIDE - Abnormal; Notable for the following components:   B Natriuretic Peptide 555.0 (*)    All other components within normal limits  SARS CORONAVIRUS 2 BY RT PCR  TROPONIN I (HIGH SENSITIVITY)    EKG EKG Interpretation  Date/Time:  Wednesday May 12 2022 08:00:08 EDT Ventricular Rate:  75 PR Interval:  160 QRS Duration: 91 QT Interval:  447 QTC Calculation: 500 R Axis:   68 Text Interpretation: Sinus rhythm Borderline repolarization abnormality Borderline prolonged QT interval Confirmed by Milton Ferguson [phone]) on 05/12/2022 9:50:23 AM  Radiology DG Chest Port 1 View  Result Date: 05/12/2022 CLINICAL DATA:  Shortness  of breath, weakness EXAM: PORTABLE CHEST 1 VIEW COMPARISON:  Radiograph 01/17/2021 FINDINGS: Unchanged cardiomediastinal silhouette. There are diffuse interstitial and mid to lower lung predominant airspace opacities. Moderate left and small right pleural effusions, with dense left basilar consolidation. No pneumothorax. No acute osseous abnormality. IMPRESSION: Moderate pulmonary edema with moderate left and small right pleural effusions and adjacent basilar atelectasis. Electronically Signed   By: Maurine Simmering M.D.   On: 05/12/2022 08:15    Procedures Procedures  Medications Ordered in ED Medications  ipratropium-albuterol (DUONEB) 0.5-2.5 (3) MG/3ML nebulizer solution 3 mL (3 mLs Nebulization Given 05/12/22 0833)  albuterol (PROVENTIL) (2.5 MG/3ML) 0.083% nebulizer solution 2.5 mg (2.5 mg Nebulization Given 05/12/22 0833)  methylPREDNISolone sodium succinate (SOLU-MEDROL) 125 mg/2 mL injection 125 mg (125 mg Intravenous Given 05/12/22 0845)  magnesium sulfate IVPB 2 g 50 mL (0 g Intravenous Stopped 05/12/22 1047)  furosemide (LASIX) injection 20 mg (20 mg Intravenous Given 05/12/22 1059)    ED Course/ Medical Decision Making/ A&P                           Medical Decision Making Amount and/or Complexity of Data Reviewed Labs: ordered. Radiology: ordered.  Risk Prescription drug management. Decision regarding hospitalization.  This patient presents to the ED for concern of shortness of breath, this involves an extensive number of treatment options, and is a complaint that carries with it a high risk of complications and morbidity.  The differential diagnosis includes COPD exacerbation, pneumonia   Co morbidities that complicate the patient evaluation  COPD   Additional history obtained:  Additional history obtained from patient External records from outside source obtained and reviewed including hospital records   Lab Tests:  I Ordered, and personally interpreted labs.  The  pertinent results include: CBC shows mild elevation of white count 10.8 BNP is elevated at 555   Imaging Studies ordered:  I ordered imaging studies including chest X I independently visualized and interpreted imaging which showed congestive heart failure I agree with the radiologist interpretation   Cardiac Monitoring: / EKG:  The patient was maintained on a cardiac monitor.  I personally viewed and interpreted the cardiac monitored which showed an underlying rhythm of: Normal sinus rhythm   Consultations Obtained:  I requested consultation with the hospitalist,  and discussed lab and imaging findings as well as pertinent plan - they recommend: Admit for hypoxia   Problem List / ED Course / Critical interventions / Medication management  COPD and heart failure I ordered medication including albuterol and Atrovent neb treatments, Lasix, Solu-Medrol Reevaluation of the patient after these medicines showed that the patient improved I have reviewed the patients home medicines and have made adjustments as needed   Social Determinants of Health:  None   Test / Admission - Considered:  Patient will be admitted.  No other test needed  Patient with heart failure and COPD and oxygen requirement.  She will be admitted to medicine        Final Clinical Impression(s) / ED Diagnoses Final diagnoses:  COPD exacerbation ([place])  Systolic congestive heart failure, unspecified HF chronicity ([place])    Rx / DC Orders ED Discharge Orders     None         Milton Ferguson, MD 05/12/22 1704

## 2022-05-12 NOTE — ED Notes (Signed)
Pt ambulated to and from bathroom without assistance. Nurse notified

## 2022-05-12 NOTE — Progress Notes (Signed)
PT tolerated left sided thoracentesis procedure and post chest xray well today and 1.1 Liters of clear yellow fluid removed, labs collected and sent for processing. PT verbalized understanding of post procedure instructions and returned via stretcher to ED room assignment at this time with no acute distress noted.

## 2022-05-12 NOTE — ED Notes (Signed)
Placed on 2lpm [place]

## 2022-05-12 NOTE — ED Triage Notes (Signed)
Patient with complaints of shortness of breath and weakness for 2 days.

## 2022-05-13 DIAGNOSIS — J9601 Acute respiratory failure with hypoxia: Secondary | ICD-10-CM | POA: Diagnosis not present

## 2022-05-13 LAB — CBC
HCT: 33.6 % — ABNORMAL LOW (ref 36.0–46.0)
Hemoglobin: 11.4 g/dL — ABNORMAL LOW (ref 12.0–15.0)
MCH: 33.3 pg (ref 26.0–34.0)
MCHC: 33.9 g/dL (ref 30.0–36.0)
MCV: 98.2 fL (ref 80.0–100.0)
Platelets: 376 10*3/uL (ref 150–400)
RBC: 3.42 MIL/uL — ABNORMAL LOW (ref 3.87–5.11)
RDW: 13.9 % (ref 11.5–15.5)
WBC: 12.4 10*3/uL — ABNORMAL HIGH (ref 4.0–10.5)
nRBC: 0 % (ref 0.0–0.2)

## 2022-05-13 LAB — OSMOLALITY, URINE: Osmolality, Ur: 375 mOsm/kg (ref 300–900)

## 2022-05-13 LAB — MAGNESIUM: Magnesium: 2.2 mg/dL (ref 1.7–2.4)

## 2022-05-13 LAB — BASIC METABOLIC PANEL
Anion gap: 7 (ref 5–15)
BUN: 26 mg/dL — ABNORMAL HIGH (ref 8–23)
CO2: 25 mmol/L (ref 22–32)
Calcium: 9.2 mg/dL (ref 8.9–10.3)
Chloride: 105 mmol/L (ref 98–111)
Creatinine, Ser: 0.91 mg/dL (ref 0.44–1.00)
GFR, Estimated: >60 mL/min (ref >60)
Glucose, Bld: 89 mg/dL (ref 70–99)
Potassium: 4.3 mmol/L (ref 3.5–5.1)
Sodium: 137 mmol/L (ref 135–145)

## 2022-05-13 MED ORDER — FUROSEMIDE 20 MG PO TABS: 20.0000 mg | ORAL_TABLET | Freq: Every day | ORAL | 2 refills | Status: DC | PRN

## 2022-05-13 MED ORDER — COMBIVENT RESPIMAT 20-100 MCG/ACT IN AERS: 1.0000 | INHALATION_SPRAY | Freq: Four times a day (QID) | RESPIRATORY_TRACT | 2 refills | Status: DC | PRN

## 2022-05-13 NOTE — Progress Notes (Signed)
Nsg Discharge Note  Admit Date:  05/12/2022 Discharge date: 05/13/2022   ARIANY KESSELMAN to be D/C'd Home per MD order.  AVS completed.  Copy for chart, and copy for patient signed, and dated. Patient/caregiver able to verbalize understanding.  Discharge Medication: Allergies as of 05/13/2022   No Known Allergies      Medication List     TAKE these medications    acetaminophen 500 MG tablet Commonly known as: TYLENOL Take 2 tablets (1,000 mg total) by mouth every 6 (six) hours as needed. What changed: reasons to take this   amLODipine 5 MG tablet Commonly known as: NORVASC Take 5 mg by mouth daily.   calcium carbonate 600 MG Tabs tablet Commonly known as: OS-CAL Take 600 mg by mouth daily.   Combivent Respimat 20-100 MCG/ACT Aers respimat Generic drug: Ipratropium-Albuterol Inhale 1 puff into the lungs every 6 (six) hours as needed for wheezing or shortness of breath.   ECHINACEA PO Take 1 capsule by mouth daily.   ferrous sulfate 325 (65 FE) MG EC tablet Take 325 mg by mouth 2 (two) times daily.   FISH OIL PO Take 1 tablet by mouth daily.   furosemide 20 MG tablet Commonly known as: Lasix Take 1 tablet (20 mg total) by mouth daily as needed for fluid or edema. Take for weight gain of 3lbs or more in 24 hours or 5lbs or more over 7 days. Also for swelling or shortness of breath.   gabapentin 300 MG capsule Commonly known as: NEURONTIN Take 1 capsule (300 mg total) by mouth 4 (four) times daily.   losartan 50 MG tablet Commonly known as: COZAAR Take 50 mg by mouth daily.   metoprolol tartrate 25 MG tablet Commonly known as: LOPRESSOR Take 0.5 tablets (12.5 mg total) by mouth 2 (two) times daily.   pravastatin 10 MG tablet Commonly known as: PRAVACHOL Take 10 mg by mouth daily.        Discharge Assessment: Vitals:   05/13/22 0418 05/13/22 0834  BP: 133/75 133/70  Pulse: 68 76  Resp: 19   Temp: 98 F (36.7 C)   SpO2: 95%    Skin clean, dry and  intact without evidence of skin break down, no evidence of skin tears noted. IV catheter discontinued intact. Site without signs and symptoms of complications - no redness or edema noted at insertion site, patient denies c/o pain - only slight tenderness at site.  Dressing with slight pressure applied.  D/c Instructions-Education: Discharge instructions given to patient/family with verbalized understanding. D/c education completed with patient/family including follow up instructions, medication list, d/c activities limitations if indicated, with other d/c instructions as indicated by MD - patient able to verbalize understanding, all questions fully answered. Patient instructed to return to ED, call 911, or call MD for any changes in condition.  Patient escorted via [place], and D/C home via private auto.  Dorcas Mcmurray, RN 05/13/2022 10:35 AM

## 2022-05-13 NOTE — Care Management CC44 (Signed)
Condition Code 44 Documentation Completed  Patient Details  Name: Kathleen Pope MRN: [id number] Date of Birth: September 11, 1933   Condition Code 44 given:  Yes Patient signature on Condition Code 44 notice:  Yes Documentation of 2 MD's agreement:  Yes Code 44 added to claim:  Yes    Boneta Lucks, RN 05/13/2022, 9:24 AM

## 2022-05-13 NOTE — TOC Progression Note (Signed)
  Transition of Care [facility]) Screening Note   Patient Details  Name: Kathleen Pope Date of Birth: 02-03-1933   Transition of Care [facility]) CM/SW Contact:    Boneta Lucks, RN Phone Number: 05/13/2022, 10:27 AM    Transition of Care Department [facility]) has reviewed patient and no TOC needs have been identified at this time. We will continue to monitor patient advancement through interdisciplinary progression rounds. If new patient transition needs arise, please place a TOC consult.    Expected Discharge Plan: Home/Self Care Barriers to Discharge: Barriers Resolved  Expected Discharge Plan and Services Expected Discharge Plan: Home/Self Care      Expected Discharge Date: 05/13/22

## 2022-05-13 NOTE — Discharge Summary (Signed)
Physician Discharge Summary  [name]:[id number] DOB: 29-May-1933 DOA: 05/12/2022  PCP: Redmond School, MD  Admit date: 05/12/2022  Discharge date: 05/13/2022  Admitted From:Home  Disposition:  Home  Recommendations for Outpatient Follow-up:  Follow up with PCP in 1-2 weeks Follow-up with cardiology as recommended regarding chronic diastolic heart failure and recurrent pleural effusions Remain on Lasix as needed for weight gain or shortness of breath as noted below Combivent prescribed as needed for shortness of breath or wheezing Continue other home medications as prior  Home Health: None  Equipment/Devices: None  Discharge Condition:Stable  CODE STATUS: DNR  Diet recommendation: Heart Healthy  Brief/Interim Summary:  Kathleen Pope is a 88 y.o. female with medical history significant for COPD, tobacco abuse, hypertension, dyslipidemia, and prior GI bleed due to AVMs who presented to the ED today with complaints of some weakness and shortness of breath that began approximately 2 days ago.  This was accompanied by some orthopnea.  She was admitted with acute hypoxemic respiratory failure secondary to volume overload with bilateral pleural effusions likely due to acute on chronic diastolic CHF exacerbation.  She had thoracentesis performed with 1.1 L of fluid removed with immediate relief and improvement in her oxygenation status.  She remained on some IV Lasix with little urine output, but overall she was doing much better and is now stable for discharge as she is otherwise asymptomatic.  2D echocardiogram as noted below with preserved LVEF and grade 1 diastolic dysfunction.  No other acute events noted throughout the course of the stay and recommendations are as noted above.  Discharge Diagnoses:  Principal Problem:   Acute hypoxemic respiratory failure (HCC) Active Problems:   Mixed hyperlipidemia   Cigarette smoker   Murmur   AVM (arteriovenous malformation) of colon   HTN  (hypertension)   Protein-calorie malnutrition, severe  Principal discharge diagnosis: Acute hypoxemic respiratory failure secondary to acute on chronic diastolic congestive heart failure exacerbation with bilateral pleural effusions.  Status post thoracentesis.  Discharge Instructions  Discharge Instructions     Diet - low sodium heart healthy   Complete by: As directed    Increase activity slowly   Complete by: As directed       Allergies as of 05/13/2022   No Known Allergies      Medication List     TAKE these medications    acetaminophen 500 MG tablet Commonly known as: TYLENOL Take 2 tablets (1,000 mg total) by mouth every 6 (six) hours as needed. What changed: reasons to take this   amLODipine 5 MG tablet Commonly known as: NORVASC Take 5 mg by mouth daily.   calcium carbonate 600 MG Tabs tablet Commonly known as: OS-CAL Take 600 mg by mouth daily.   Combivent Respimat 20-100 MCG/ACT Aers respimat Generic drug: Ipratropium-Albuterol Inhale 1 puff into the lungs every 6 (six) hours as needed for wheezing or shortness of breath.   ECHINACEA PO Take 1 capsule by mouth daily.   ferrous sulfate 325 (65 FE) MG EC tablet Take 325 mg by mouth 2 (two) times daily.   FISH OIL PO Take 1 tablet by mouth daily.   furosemide 20 MG tablet Commonly known as: Lasix Take 1 tablet (20 mg total) by mouth daily as needed for fluid or edema. Take for weight gain of 3lbs or more in 24 hours or 5lbs or more over 7 days. Also for swelling or shortness of breath.   gabapentin 300 MG capsule Commonly known as: NEURONTIN Take  1 capsule (300 mg total) by mouth 4 (four) times daily.   losartan 50 MG tablet Commonly known as: COZAAR Take 50 mg by mouth daily.   metoprolol tartrate 25 MG tablet Commonly known as: LOPRESSOR Take 0.5 tablets (12.5 mg total) by mouth 2 (two) times daily.   pravastatin 10 MG tablet Commonly known as: PRAVACHOL Take 10 mg by mouth daily.         Follow-up Information     Redmond School, MD. Schedule an appointment as soon as possible for a visit in 1 week(s).   Specialty: Internal Medicine Contact information: [address] [place] [address] [phone]         [facility] [place]. Go to.   Specialty: Cardiology Contact information: [address] [place] [phone]               No Known Allergies  Consultations: None   Procedures/Studies: ECHOCARDIOGRAM COMPLETE  Result Date: 05/12/2022    ECHOCARDIOGRAM REPORT   Patient Name:   Kathleen Pope Date of Exam: 05/12/2022 Medical Rec #:  [id number]    Height:       60.0 in Accession #:    [id number]   Weight:       93.0 lb Date of Birth:  February 23, 1933     BSA:          1.348 m Patient Age:    88 years     BP:           143/77 mmHg Patient Gender: F            HR:           83 bpm. Exam Location:  [facility] Procedure: 2D Echo, Cardiac Doppler and Color Doppler Indications:    CHF-Acute Diastolic X54.00  History:        Patient has prior history of Echocardiogram examinations, most                 recent 02/05/2021. COPD, Aortic Valve Disease, Arrythmias:RBBB,                 Signs/Symptoms:Murmur; Risk Factors:Hypertension, Dyslipidemia                 and Current Smoker.  Sonographer:    Alvino Chapel RCS Referring Phys: [id number] [place] D [place]  1. Left ventricular ejection fraction, by estimation, is 70 to 75%. The left ventricle has hyperdynamic function. The left ventricle has no regional wall motion abnormalities. There is severe left ventricular hypertrophy. Left ventricular diastolic parameters are consistent with Grade I diastolic dysfunction (impaired relaxation). Elevated left atrial pressure.  2. Right ventricular systolic function is normal. The right ventricular size is normal. There is normal pulmonary artery systolic pressure.  3. Left atrial size was mildly dilated.  4. The mitral valve is normal in  structure. No evidence of mitral valve regurgitation. No evidence of mitral stenosis. Moderate mitral annular calcification.  5. The tricuspid valve is abnormal.  6. The aortic valve has an indeterminant number of cusps. There is severe calcifcation of the aortic valve. There is severe thickening of the aortic valve. Aortic valve regurgitation is mild to moderate. Moderate aortic valve stenosis. Aortic valve mean  gradient measures 38.0 mmHg. Aortic valve peak gradient measures 65.0 mmHg. Aortic valve area, by VTI measures 1.09 cm.  7. The inferior vena cava is dilated in size with >50% respiratory variability, suggesting right atrial pressure of 8 mmHg. FINDINGS  Left Ventricle: Left ventricular ejection fraction, by estimation, is 70 to 75%. The left ventricle has hyperdynamic function. The left ventricle has no regional wall motion abnormalities. The left ventricular internal cavity size was normal in size. There is severe left ventricular hypertrophy. Left ventricular diastolic parameters are consistent with Grade I diastolic dysfunction (impaired relaxation). Elevated left atrial pressure. Right Ventricle: The right ventricular size is normal. Right vetricular wall thickness was not well visualized. Right ventricular systolic function is normal. There is normal pulmonary artery systolic pressure. The tricuspid regurgitant velocity is 2.59 m/s, and with an assumed right atrial pressure of 8 mmHg, the estimated right ventricular systolic pressure is 24.4 mmHg. Left Atrium: Left atrial size was mildly dilated. Right Atrium: Right atrial size was normal in size. Pericardium: There is no evidence of pericardial effusion. Mitral Valve: The mitral valve is normal in structure. There is moderate thickening of the mitral valve leaflet(s). There is moderate calcification of the mitral valve leaflet(s). Moderate mitral annular calcification. No evidence of mitral valve regurgitation. No evidence of mitral valve stenosis.  Tricuspid Valve: The tricuspid valve is abnormal. Tricuspid valve regurgitation is mild . No evidence of tricuspid stenosis. Aortic Valve: The aortic valve has an indeterminant number of cusps. There is severe calcifcation of the aortic valve. There is severe thickening of the aortic valve. There is severe aortic valve annular calcification. Aortic valve regurgitation is mild to moderate. Aortic regurgitation PHT measures 362 msec. Moderate aortic stenosis is present. Aortic valve mean gradient measures 38.0 mmHg. Aortic valve peak gradient measures 65.0 mmHg. Aortic valve area, by VTI measures 1.09 cm. Pulmonic Valve: The pulmonic valve was not well visualized. Pulmonic valve regurgitation is mild. No evidence of pulmonic stenosis. Aorta: The aortic root is normal in size and structure. Venous: The inferior vena cava is dilated in size with greater than 50% respiratory variability, suggesting right atrial pressure of 8 mmHg. IAS/Shunts: No atrial level shunt detected by color flow Doppler.  LEFT VENTRICLE PLAX 2D LVIDd:         2.90 cm   Diastology LVIDs:         1.80 cm   LV e' medial:    3.37 cm/s LV PW:         1.50 cm   LV E/e' medial:  30.3 LV IVS:        1.60 cm   LV e' lateral:   3.37 cm/s LVOT diam:     2.00 cm   LV E/e' lateral: 30.3 LV SV:         85 LV SV Index:   63 LVOT Area:     3.14 cm  RIGHT VENTRICLE RV S prime:     14.70 cm/s TAPSE (M-mode): 2.0 cm LEFT ATRIUM             Index        RIGHT ATRIUM          Index LA diam:        3.40 cm 2.52 cm/m   RA Area:     9.56 cm LA Vol (A2C):   36.8 ml 27.30 ml/m  RA Volume:   20.70 ml 15.35 ml/m LA Vol (A4C):   59.7 ml 44.28 ml/m LA Biplane Vol: 49.0 ml 36.35 ml/m  AORTIC VALVE AV Area (Vmax):    1.09 cm AV Area (Vmean):   1.07 cm AV Area (VTI):     1.09 cm AV Vmax:  403.00 cm/s AV Vmean:          276.000 cm/s AV VTI:            0.773 m AV Peak Grad:      65.0 mmHg AV Mean Grad:      38.0 mmHg LVOT Vmax:         140.00 cm/s LVOT Vmean:         93.700 cm/s LVOT VTI:          0.269 m LVOT/AV VTI ratio: 0.35 AI PHT:            362 msec  AORTA Ao Root diam: 2.80 cm MITRAL VALVE                TRICUSPID VALVE MV Area (PHT): 1.35 cm     TR Peak grad:   26.8 mmHg MV Decel Time: 560 msec     TR Vmax:        259.00 cm/s MV E velocity: 102.00 cm/s MV A velocity: 122.00 cm/s  SHUNTS MV E/A ratio:  0.84         Systemic VTI:  0.27 m                             Systemic Diam: 2.00 cm Carlyle Dolly MD Electronically signed by Carlyle Dolly MD Signature Date/Time: 05/12/2022/4:06:06 PM    Final    US THORACENTESIS ASP PLEURAL SPACE W/IMG GUIDE  Result Date: 05/12/2022 Lavonia Dana, MD     05/12/2022  2:31 PM PreOperative Dx: LEFT pleural effusion Postoperative Dx: LEFT pleural effusion Procedure:   US guided LEFT thoracentesis Radiologist:  Thornton Papas Anesthesia:  10 ml of 1% lidocaine Specimen:  1.1 L of yellow colored fluid EBL:   < 1 ml Complications: None    DG Chest 1 View  Result Date: 05/12/2022 CLINICAL DATA:  Left pleural effusion post thoracentesis EXAM: CHEST  1 VIEW COMPARISON:  Exam at 1333 hours compared to 0805 hours FINDINGS: Upper normal size of cardiac silhouette. Atherosclerotic calcifications aorta. Near complete resolution of previously seen LEFT pleural effusion post thoracentesis. No pneumothorax. Persistent small RIGHT pleural effusion and basilar atelectasis. Bones demineralized. IMPRESSION: No pneumothorax following LEFT thoracentesis. Persistent small RIGHT pleural effusion and basilar atelectasis. Aortic Atherosclerosis (ICD10-I70.0). Electronically Signed   By: Lavonia Dana M.D.   On: 05/12/2022 14:06   DG Chest Port 1 View  Result Date: 05/12/2022 CLINICAL DATA:  Shortness of breath, weakness EXAM: PORTABLE CHEST 1 VIEW COMPARISON:  Radiograph 01/17/2021 FINDINGS: Unchanged cardiomediastinal silhouette. There are diffuse interstitial and mid to lower lung predominant airspace opacities. Moderate left and small right pleural  effusions, with dense left basilar consolidation. No pneumothorax. No acute osseous abnormality. IMPRESSION: Moderate pulmonary edema with moderate left and small right pleural effusions and adjacent basilar atelectasis. Electronically Signed   By: Maurine Simmering M.D.   On: 05/12/2022 08:15     Discharge Exam: Vitals:   05/13/22 0418 05/13/22 0834  BP: 133/75 133/70  Pulse: 68 76  Resp: 19   Temp: 98 F (36.7 C)   SpO2: 95%    Vitals:   05/12/22 2045 05/13/22 0418 05/13/22 0543 05/13/22 0834  BP: 116/68 133/75  133/70  Pulse: 81 68  76  Resp: 19 19    Temp: 98.4 F (36.9 C) 98 F (36.7 C)    TempSrc: Oral Oral    SpO2: 94% 95%    Weight:  40.1 kg   Height:        General: Pt is alert, awake, not in acute distress Cardiovascular: RRR, S1/S2 +, no rubs, no gallops Respiratory: CTA bilaterally, no wheezing, no rhonchi Abdominal: Soft, NT, ND, bowel sounds + Extremities: no edema, no cyanosis    The results of significant diagnostics from this hospitalization (including imaging, microbiology, ancillary and laboratory) are listed below for reference.     Microbiology: Recent Results (from the past 240 hour(s))  SARS Coronavirus 2 by RT PCR (hospital order, performed in [facility] hospital lab) *cepheid single result test* Anterior Nasal Swab     Status: None   Collection Time: 05/12/22  8:57 AM   Specimen: Anterior Nasal Swab  Result Value Ref Range Status   SARS Coronavirus 2 by RT PCR NEGATIVE NEGATIVE Final    Comment: (NOTE) SARS-CoV-2 target nucleic acids are NOT DETECTED.  The SARS-CoV-2 RNA is generally detectable in upper and lower respiratory specimens during the acute phase of infection. The lowest concentration of SARS-CoV-2 viral copies this assay can detect is 250 copies / mL. A negative result does not preclude SARS-CoV-2 infection and should not be used as the sole basis for treatment or other patient management decisions.  A negative result may occur  with improper specimen collection / handling, submission of specimen other than nasopharyngeal swab, presence of viral mutation(s) within the areas targeted by this assay, and inadequate number of viral copies (<250 copies / mL). A negative result must be combined with clinical observations, patient history, and epidemiological information.  Fact Sheet for Patients:   https://www.patel.info/  Fact Sheet for Healthcare Providers: https://hall.com/  This test is not yet approved or  cleared by the [place] FDA and has been authorized for detection and/or diagnosis of SARS-CoV-2 by FDA under an Emergency Use Authorization (EUA).  This EUA will remain in effect (meaning this test can be used) for the duration of the COVID-19 declaration under Section 564(b)(1) of the Act, 21 U.S.C. section 360bbb-3(b)(1), unless the authorization is terminated or revoked sooner.  Performed at [facility], [address] [place], [place] [address]   Gram stain     Status: None   Collection Time: 05/12/22  1:30 PM   Specimen: Pleura  Result Value Ref Range Status   Specimen Description PLEURAL  Final   Special Requests LEFT  Final   Gram Stain   Final    WBC PRESENT,BOTH PMN AND MONONUCLEAR NO ORGANISMS SEEN CYTOSPIN SMEAR Performed at [facility], [address] [place], [place] [address]    Report Status 05/12/2022 FINAL  Final  Culture, body fluid w Gram Stain-bottle     Status: None (Preliminary result)   Collection Time: 05/12/22  1:30 PM   Specimen: Pleura  Result Value Ref Range Status   Specimen Description PLEURAL  Final   Special Requests   Final    BOTTLES DRAWN AEROBIC AND ANAEROBIC Blood Culture adequate volume   Culture   Final    NO GROWTH < 24 HOURS Performed at [facility], [address] [place], [place] [address]    Report Status PENDING  Incomplete     Labs: BNP (last 3 results) Recent Labs    05/12/22 0815  BNP  182.9*   Basic Metabolic Panel: Recent Labs  Lab 05/12/22 0815 05/12/22 1227 05/13/22 0352  NA 133*  --  137  K 3.9  --  4.3  CL 99  --  105  CO2 25  --  25  GLUCOSE 112*  --  89  BUN 13  --  26*  CREATININE 0.73 0.73 0.91  CALCIUM 9.5  --  9.2  MG  --   --  2.2   Liver Function Tests: Recent Labs  Lab 05/12/22 0815  AST 21  ALT 13  ALKPHOS 62  BILITOT 0.8  PROT 7.4  ALBUMIN 3.8   No results for input(s): "LIPASE", "AMYLASE" in the last 168 hours. No results for input(s): "AMMONIA" in the last 168 hours. CBC: Recent Labs  Lab 05/12/22 0815 05/12/22 1227 05/13/22 0352  WBC 10.8* 10.7* 12.4*  NEUTROABS 7.8*  --   --   HGB 13.0 13.6 11.4*  HCT 37.1 40.1 33.6*  MCV 95.9 96.4 98.2  PLT 416* 439* 376   Cardiac Enzymes: No results for input(s): "CKTOTAL", "CKMB", "CKMBINDEX", "TROPONINI" in the last 168 hours. BNP: Invalid input(s): "POCBNP" CBG: Recent Labs  Lab 05/12/22 1631  GLUCAP 265*   D-Dimer No results for input(s): "DDIMER" in the last 72 hours. Hgb A1c No results for input(s): "HGBA1C" in the last 72 hours. Lipid Profile No results for input(s): "CHOL", "HDL", "LDLCALC", "TRIG", "CHOLHDL", "LDLDIRECT" in the last 72 hours. Thyroid function studies Recent Labs    05/12/22 1227  TSH 0.827   Anemia work up No results for input(s): "VITAMINB12", "FOLATE", "FERRITIN", "TIBC", "IRON", "RETICCTPCT" in the last 72 hours. Urinalysis    Component Value Date/Time   COLORURINE YELLOW 03/09/2013 2028   APPEARANCEUR CLEAR 03/09/2013 2028   LABSPEC 1.036 (H) 03/09/2013 2028   PHURINE 6.5 03/09/2013 2028   GLUCOSEU 500 (A) 03/09/2013 2028   HGBUR NEGATIVE 03/09/2013 2028   BILIRUBINUR NEGATIVE 03/09/2013 2028   KETONESUR 15 (A) 03/09/2013 2028   PROTEINUR NEGATIVE 03/09/2013 2028   UROBILINOGEN 1.0 03/09/2013 2028   NITRITE NEGATIVE 03/09/2013 2028   LEUKOCYTESUR NEGATIVE 03/09/2013 2028   Sepsis Labs Recent Labs  Lab 05/12/22 0815  05/12/22 1227 05/13/22 0352  WBC 10.8* 10.7* 12.4*   Microbiology Recent Results (from the past 240 hour(s))  SARS Coronavirus 2 by RT PCR (hospital order, performed in [facility] hospital lab) *cepheid single result test* Anterior Nasal Swab     Status: None   Collection Time: 05/12/22  8:57 AM   Specimen: Anterior Nasal Swab  Result Value Ref Range Status   SARS Coronavirus 2 by RT PCR NEGATIVE NEGATIVE Final    Comment: (NOTE) SARS-CoV-2 target nucleic acids are NOT DETECTED.  The SARS-CoV-2 RNA is generally detectable in upper and lower respiratory specimens during the acute phase of infection. The lowest concentration of SARS-CoV-2 viral copies this assay can detect is 250 copies / mL. A negative result does not preclude SARS-CoV-2 infection and should not be used as the sole basis for treatment or other patient management decisions.  A negative result may occur with improper specimen collection / handling, submission of specimen other than nasopharyngeal swab, presence of viral mutation(s) within the areas targeted by this assay, and inadequate number of viral copies (<250 copies / mL). A negative result must be combined with clinical observations, patient history, and epidemiological information.  Fact Sheet for Patients:   https://www.patel.info/  Fact Sheet for Healthcare Providers: https://hall.com/  This test is not yet approved or  cleared by the [place] FDA and has been authorized for detection and/or diagnosis of SARS-CoV-2 by FDA under an Emergency Use Authorization (EUA).  This EUA will remain in effect (meaning this test can be used) for the duration of  the COVID-19 declaration under Section 564(b)(1) of the Act, 21 U.S.C. section 360bbb-3(b)(1), unless the authorization is terminated or revoked sooner.  Performed at [facility], [address], [place], [place] [address]   Gram stain     Status: None    Collection Time: 05/12/22  1:30 PM   Specimen: Pleura  Result Value Ref Range Status   Specimen Description PLEURAL  Final   Special Requests LEFT  Final   Gram Stain   Final    WBC PRESENT,BOTH PMN AND MONONUCLEAR NO ORGANISMS SEEN CYTOSPIN SMEAR Performed at [facility], [address] [place], [place] [address]    Report Status 05/12/2022 FINAL  Final  Culture, body fluid w Gram Stain-bottle     Status: None (Preliminary result)   Collection Time: 05/12/22  1:30 PM   Specimen: Pleura  Result Value Ref Range Status   Specimen Description PLEURAL  Final   Special Requests   Final    BOTTLES DRAWN AEROBIC AND ANAEROBIC Blood Culture adequate volume   Culture   Final    NO GROWTH < 24 HOURS Performed at [facility], [address] [place],  [address]    Report Status PENDING  Incomplete     Time coordinating discharge: 35 minutes  SIGNED:   Rodena Goldmann, DO [facility] 05/13/2022, 8:52 AM  If 7PM-7AM, please contact night-coverage [email or web address]

## 2022-05-13 NOTE — Care Management Obs Status (Signed)
[place] NOTIFICATION   Patient Details  Name: Kathleen Pope MRN: [id number] Date of Birth: 07/20/1933   Medicare Observation Status Notification Given:  Yes    Boneta Lucks, RN 05/13/2022, 9:23 AM

## 2022-05-14 DIAGNOSIS — I5033 Acute on chronic diastolic (congestive) heart failure: Secondary | ICD-10-CM | POA: Diagnosis not present

## 2022-05-14 DIAGNOSIS — J449 Chronic obstructive pulmonary disease, unspecified: Secondary | ICD-10-CM | POA: Diagnosis not present

## 2022-05-14 DIAGNOSIS — J90 Pleural effusion, not elsewhere classified: Secondary | ICD-10-CM | POA: Diagnosis not present

## 2022-05-14 DIAGNOSIS — I350 Nonrheumatic aortic (valve) stenosis: Secondary | ICD-10-CM | POA: Diagnosis not present

## 2022-05-14 LAB — CYTOLOGY - NON PAP

## 2022-05-17 LAB — CULTURE, BODY FLUID W GRAM STAIN -BOTTLE
Culture: NO GROWTH
Special Requests: ADEQUATE

## 2022-05-21 ENCOUNTER — Encounter (HOSPITAL_COMMUNITY): Payer: Self-pay | Admitting: Nurse Practitioner

## 2022-05-28 ENCOUNTER — Encounter (HOSPITAL_COMMUNITY): Payer: Self-pay | Admitting: Nurse Practitioner

## 2022-05-31 DIAGNOSIS — Z08 Encounter for follow-up examination after completed treatment for malignant neoplasm: Secondary | ICD-10-CM | POA: Diagnosis not present

## 2022-05-31 DIAGNOSIS — Z85828 Personal history of other malignant neoplasm of skin: Secondary | ICD-10-CM | POA: Diagnosis not present

## 2022-05-31 DIAGNOSIS — X32XXXD Exposure to sunlight, subsequent encounter: Secondary | ICD-10-CM | POA: Diagnosis not present

## 2022-05-31 DIAGNOSIS — L570 Actinic keratosis: Secondary | ICD-10-CM | POA: Diagnosis not present

## 2022-06-06 IMAGING — CT CT ABD-PELV W/ CM
2 of 5 series · 16 of 46 positions shown, 18 images · IV contrast (OMNIPAQUE 300)
Comparison: None.

CLINICAL DATA: Melena.

EXAM:
CT ABDOMEN AND PELVIS WITH CONTRAST
TECHNIQUE: Multidetector CT imaging of the abdomen and pelvis was performed
using the standard protocol following bolus administration of
intravenous contrast.
CONTRAST:  75mL OMNIPAQUE IOHEXOL 300 MG/ML  SOLN

[Series 2: axial st · axial · 0.63mm/px · z∈[-428,-122]mm · 13 of 73 slices shown, 15 images]
[im 6/73  soft-tissue]
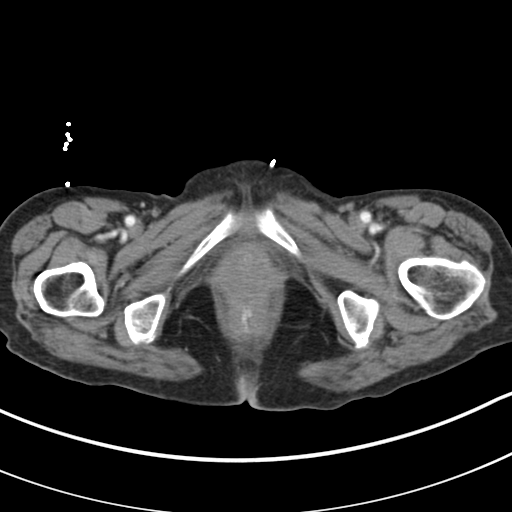
[im 6/73  bone]
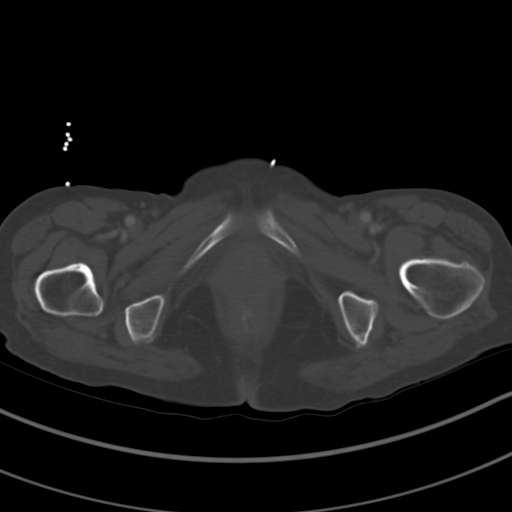
[im 11/73  soft-tissue]
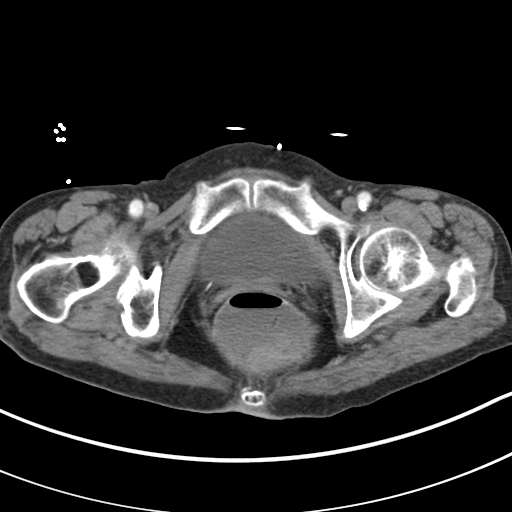
[im 16/73  soft-tissue]
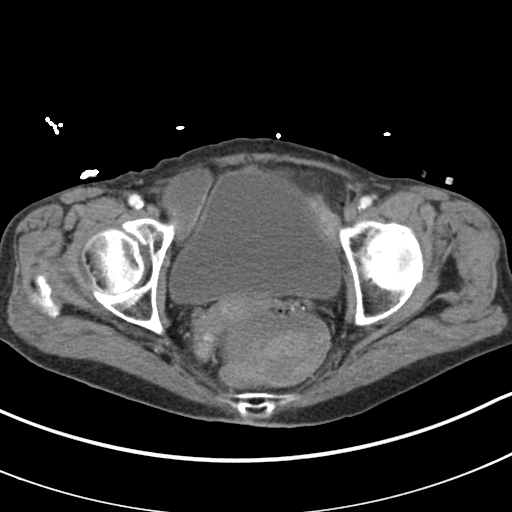
[im 21/73  soft-tissue]
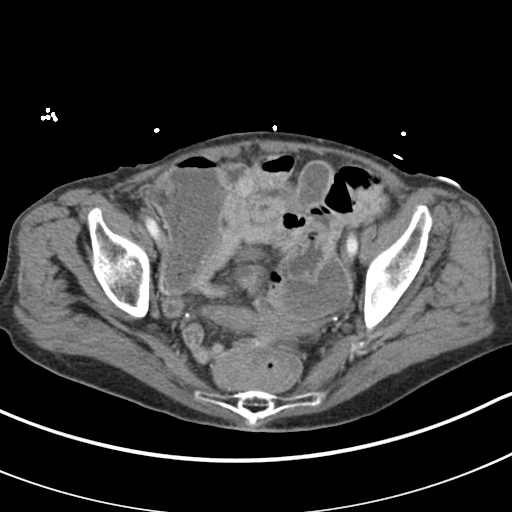
[im 26/73  soft-tissue]
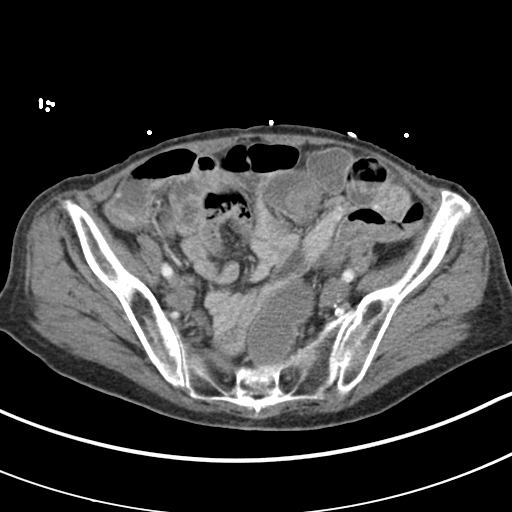
[im 31/73  soft-tissue]
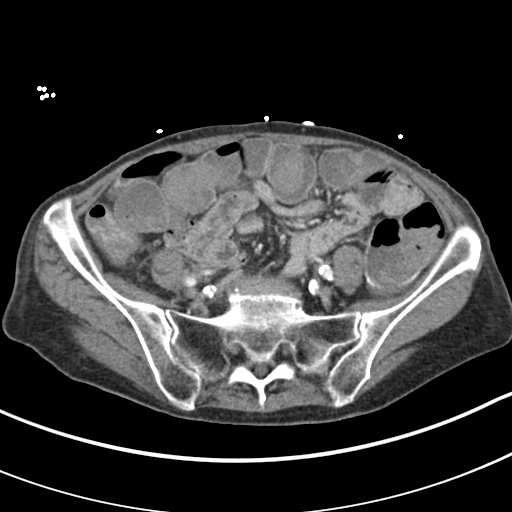
[im 37/73  soft-tissue]
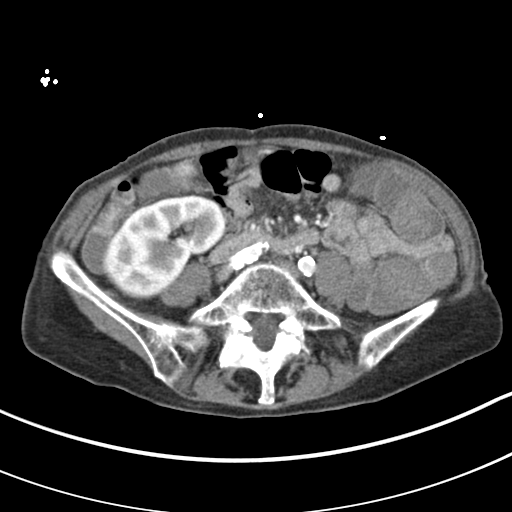
[im 42/73  soft-tissue]
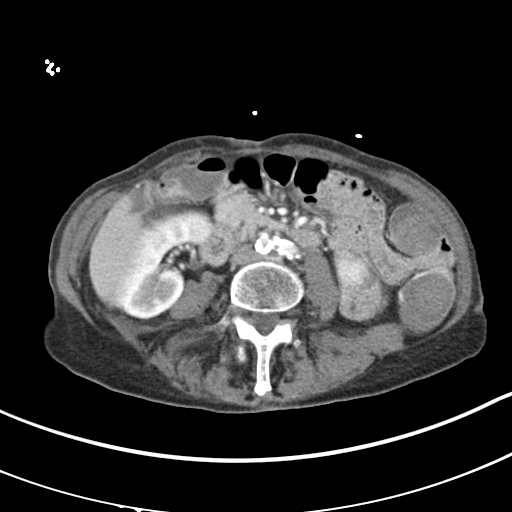
[im 47/73  soft-tissue]
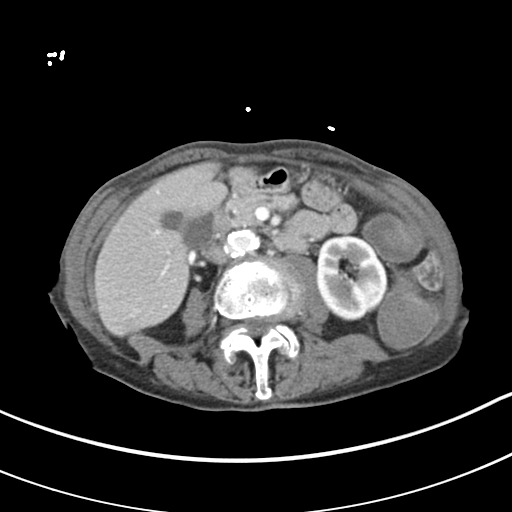
[im 47/73  bone]
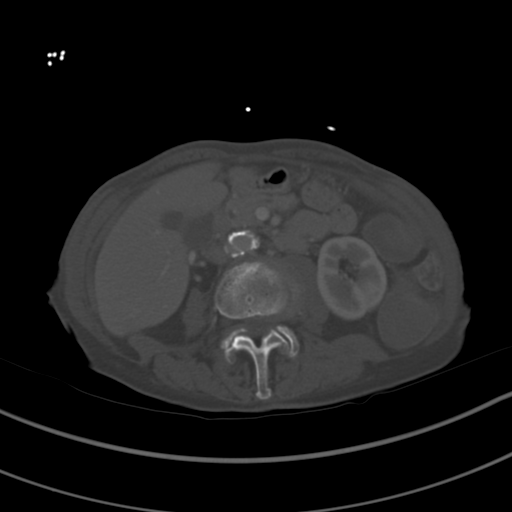
[im 52/73  soft-tissue]
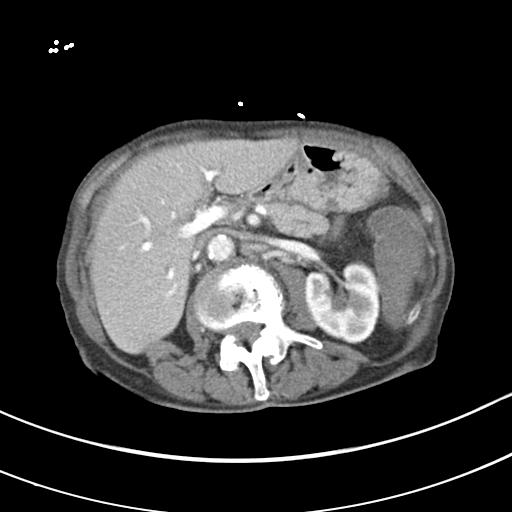
[im 57/73  soft-tissue]
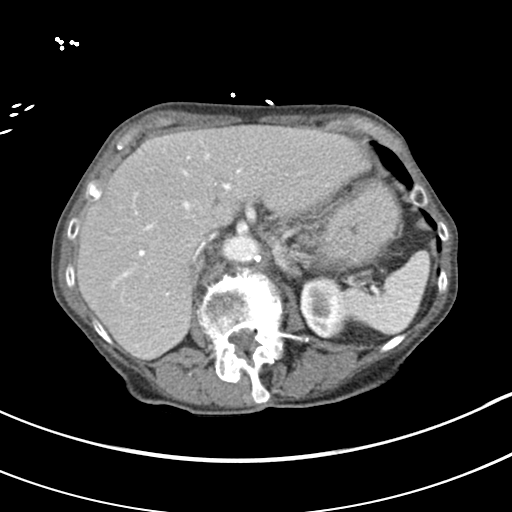
[im 62/73  soft-tissue]
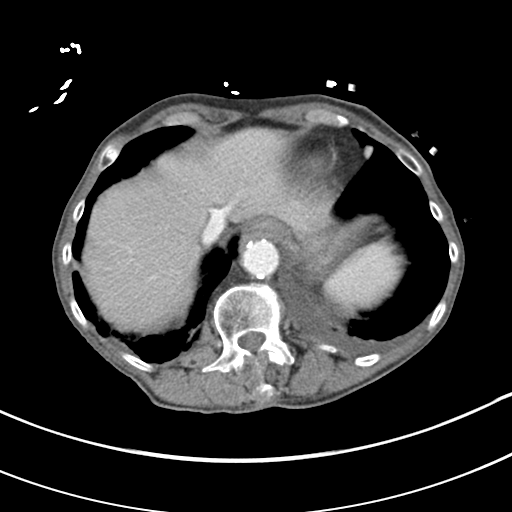
[im 67/73  soft-tissue]
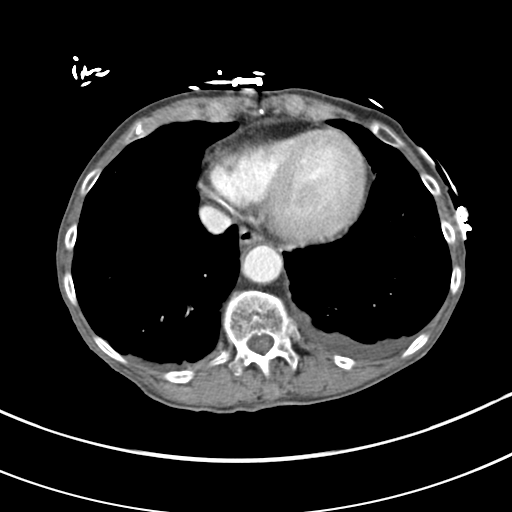

[Series 4: coronal st · coronal · 0.66mm/px · 3 of 114 slices shown]
[im 38/114  soft-tissue]
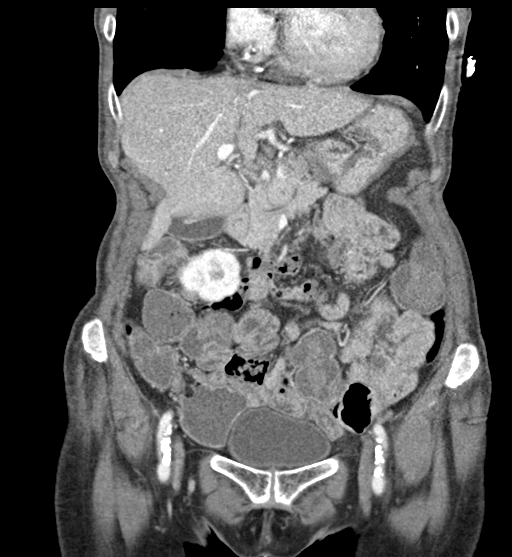
[im 51/114  soft-tissue]
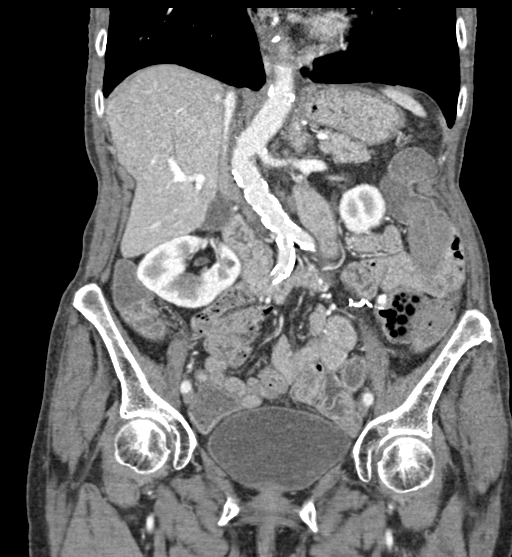
[im 63/114  soft-tissue]
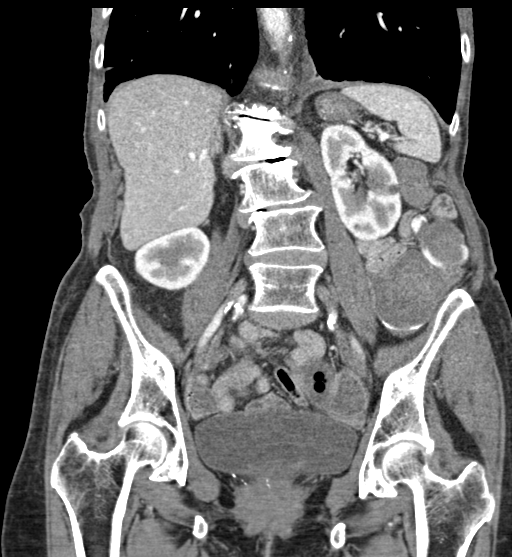

[16 of 46 positions shown; findings below may reference images not displayed]

FINDINGS: Lower chest: Minimal left pleural effusion is noted. Bibasilar
subsegmental atelectasis is noted.

Hepatobiliary: No focal liver abnormality is seen. No gallstones,
gallbladder wall thickening, or biliary dilatation.

Pancreas: Unremarkable. No pancreatic ductal dilatation or
surrounding inflammatory changes.

Spleen: Normal in size without focal abnormality.

Adrenals/Urinary Tract: Adrenal glands are unremarkable. Kidneys are
normal, without renal calculi, focal lesion, or hydronephrosis.
Bladder is unremarkable.

Stomach/Bowel: The stomach and appendix appear normal. No small
bowel dilatation is noted. Postsurgical changes are seen involving
the descending colon. The colon is dilated and fluid-filled.
Moderate amount of stool is seen throughout the colon. No definite
wall thickening is noted.

Vascular/Lymphatic: Aortic atherosclerosis. No enlarged abdominal or
pelvic lymph nodes.

Reproductive: Uterus and bilateral adnexa are unremarkable.

Other: No abdominal wall hernia or abnormality. No abdominopelvic
ascites.

Musculoskeletal: Grade 1 anterolisthesis of L5-S1 is noted secondary
to bilateral L5 spondylolysis. No acute osseous abnormality is
noted.
IMPRESSION: 1. Minimal left pleural effusion is noted with bibasilar
subsegmental atelectasis.
2. Postsurgical changes are seen involving the descending colon. The
colon is dilated and fluid-filled. Moderate amount of stool is seen
throughout the colon. No definite wall thickening is noted.
3. Grade 1 anterolisthesis of L5-S1 secondary to bilateral L5
spondylolysis.

Aortic Atherosclerosis ([id number]).

## 2022-06-06 IMAGING — NM NM GI BLOOD LOSS
2 series · 12 of 12 positions shown · non-contrast
Comparison: CT abdomen pelvis dated 01/08/2021.
COMPARISON: CT abdomen pelvis dated 01/08/2021.

Addendum:
CLINICAL DATA: 87-year-old female with lower GI bleed.

EXAM:
NUCLEAR MEDICINE GASTROINTESTINAL BLEEDING SCAN
TECHNIQUE: Sequential abdominal images were obtained following intravenous
administration of Bc-33m labeled red blood cells.
RADIOPHARMACEUTICALS:  19.8 mCi Bc-33m pertechnetate in-vitro
labeled red cells.

[Series 1: gi bleed · 3.28mm/px · 6 of 60 frames shown (1 of 2)]
[frame 6/60]
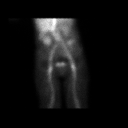
[frame 16/60]
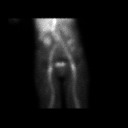
[frame 26/60]
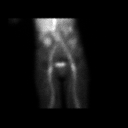
[frame 36/60]
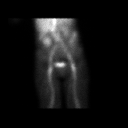
[frame 46/60]
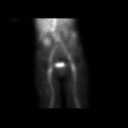
[frame 56/60]
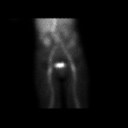

[Series 1: gi bleed · 3.28mm/px · 6 of 60 frames shown (2 of 2)]
[frame 6/60]
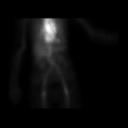
[frame 16/60]
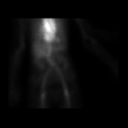
[frame 26/60]
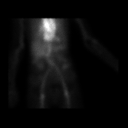
[frame 36/60]
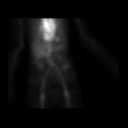
[frame 46/60]
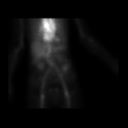
[frame 56/60]
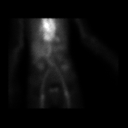

[12 of 12 positions shown; findings below may reference images not displayed]

FINDINGS: There is physiologic uptake within the liver, kidneys, and cardiac
blood pool. Physiologic activity noted in the major vasculature.

There is radiotracer activity in the left lower quadrant conforming
to the distal descending colon and sigmoid consistent with active GI
bleed.

Please note active diverticular bleed in the region of the mid
descending colon is present on the earlier CT.
IMPRESSION: Active GI bleed in the mid to distal descending colon corresponding
to the diverticular bleed on the earlier CT.

ADDENDUM:
These results were discussed by telephone at the time of
interpretation on 01/08/2021 at [DATE] to Dr.

Rosey, who verbally acknowledged these results.

*** End of Addendum ***
FINDINGS: There is physiologic uptake within the liver, kidneys, and cardiac
blood pool. Physiologic activity noted in the major vasculature.

There is radiotracer activity in the left lower quadrant conforming
to the distal descending colon and sigmoid consistent with active GI
bleed.

Please note active diverticular bleed in the region of the mid
descending colon is present on the earlier CT.
IMPRESSION: Active GI bleed in the mid to distal descending colon corresponding
to the diverticular bleed on the earlier CT.

## 2022-06-08 ENCOUNTER — Encounter (HOSPITAL_COMMUNITY): Payer: Self-pay | Admitting: Nurse Practitioner

## 2022-07-28 ENCOUNTER — Telehealth: Payer: Self-pay | Admitting: *Deleted

## 2022-07-28 NOTE — Chronic Care Management (AMB) (Signed)
  Care Coordination  Outreach Note  07/28/2022 Name: Kathleen Pope MRN: [id number] DOB: [birth year]   Care Coordination Outreach Attempts: An unsuccessful telephone outreach was attempted today to offer the patient information about available care coordination services as a benefit of their health plan.   Follow Up Plan:  Additional outreach attempts will be made to offer the patient care coordination information and services.   Encounter Outcome:  No Answer  [place]  Direct Dial: [phone]

## 2022-08-02 NOTE — Chronic Care Management (AMB) (Signed)
  Care Coordination   Note   08/02/2022 Name: FLORENDA WATT MRN: [id number] DOB: 03/19/1933  CAIT LOCUST is a 88 y.o. year old female who sees Redmond School, MD for primary care. I reached out to [name] by phone today to offer care coordination services.  Ms. Graves was given information about Care Coordination services today including:   The Care Coordination services include support from the care team which includes your Nurse Coordinator, Clinical Social Worker, or Pharmacist.  The Care Coordination team is here to help remove barriers to the health concerns and goals most important to you. Care Coordination services are voluntary, and the patient may decline or stop services at any time by request to their care team member.   Care Coordination Consent Status: Patient agreed to services and verbal consent obtained.   Follow up plan:  Telephone appointment with care coordination team member scheduled for:  08/05/22  Encounter Outcome:  Pt. Scheduled  [place]  Direct Dial: [phone]

## 2022-08-05 ENCOUNTER — Ambulatory Visit: Payer: Self-pay | Admitting: *Deleted

## 2022-08-05 ENCOUNTER — Encounter: Payer: Self-pay | Admitting: *Deleted

## 2022-08-05 NOTE — Patient Outreach (Signed)
  Care Coordination   Initial Visit Note   08/05/2022 Name: Kathleen Pope MRN: [id number] DOB: 05-Oct-1933  Kathleen Pope is a 88 y.o. year old female who sees Redmond School, MD for primary care. I spoke with  Kathleen Pope by phone today.  What matters to the patients health and wellness today?  State this are fine, denies any ongoing uncontrolled medical issues, denies need for follow up at this time.  Denies any urgent concerns, encouraged to contact this care manager with questions.      Goals Addressed             This Visit's Progress    COMPLETED: Care Coordination Activities - No follow up needed       Care Coordination Interventions: Patient interviewed about adult health maintenance status including  Colonoscopy    Depression screen    Falls risk assessment    Regular eye checkups Regular Dental Care    Blood Pressure    Advised patient to discuss  Pneumonia Vaccine Influenza Vaccine COVID vaccination    with primary care provider  Provided education about Need for yearly AWV, state next one scheduled for December SDOH assessment completed Infection prevention (wearing masks, hand hygiene, etc)         SDOH assessments and interventions completed:  Yes  SDOH Interventions Today    Flowsheet Row Most Recent Value  SDOH Interventions   Food Insecurity Interventions Intervention Not Indicated  Housing Interventions Intervention Not Indicated  Transportation Interventions Intervention Not Indicated  Utilities Interventions Intervention Not Indicated        Care Coordination Interventions Activated:  Yes  Care Coordination Interventions:  Yes, provided   Follow up plan: No further intervention required.   Encounter Outcome:  Pt. Visit Completed   [facility] [facility] Care Management Coordinator [phone]

## 2022-08-05 NOTE — Patient Instructions (Signed)
Visit Information  Thank you for taking time to visit with me today. Please don't hesitate to contact me if I can be of assistance to you.  Following are the goals we discussed today:  Continue daily monitoring of BP/HR and weights.   Please call the Suicide and Crisis Lifeline: 988 call the [place] National Suicide Prevention Lifeline: 812-697-0083 or TTY: 502-834-4289 TTY (318) 111-2554) to talk to a trained counselor call 1-800-273-TALK (toll free, 24 hour hotline) call the [facility]: [phone] call 911 if you are experiencing a Mental Health or [place] or need someone to talk to.  The patient verbalized understanding of instructions, educational materials, and care plan provided today and agreed to receive a mailed copy of patient instructions, educational materials, and care plan.   The patient has been provided with contact information for the care management team and has been advised to call with any health related questions or concerns.   Valente David, RN, MSN, [facility] Care Management Coordinator [phone]

## 2022-08-25 ENCOUNTER — Ambulatory Visit: Payer: Medicare Other | Admitting: Student

## 2022-10-20 DIAGNOSIS — Z932 Ileostomy status: Secondary | ICD-10-CM | POA: Diagnosis not present

## 2022-11-05 DIAGNOSIS — Z932 Ileostomy status: Secondary | ICD-10-CM | POA: Diagnosis not present

## 2022-11-05 DIAGNOSIS — I10 Essential (primary) hypertension: Secondary | ICD-10-CM | POA: Diagnosis not present

## 2022-11-05 DIAGNOSIS — D518 Other vitamin B12 deficiency anemias: Secondary | ICD-10-CM | POA: Diagnosis not present

## 2022-11-05 DIAGNOSIS — J449 Chronic obstructive pulmonary disease, unspecified: Secondary | ICD-10-CM | POA: Diagnosis not present

## 2022-11-05 DIAGNOSIS — E782 Mixed hyperlipidemia: Secondary | ICD-10-CM | POA: Diagnosis not present

## 2022-11-05 DIAGNOSIS — E559 Vitamin D deficiency, unspecified: Secondary | ICD-10-CM | POA: Diagnosis not present

## 2022-11-05 DIAGNOSIS — R7309 Other abnormal glucose: Secondary | ICD-10-CM | POA: Diagnosis not present

## 2022-12-16 HISTORY — PX: SKIN CANCER EXCISION: SHX779

## 2022-12-28 ENCOUNTER — Emergency Department (HOSPITAL_COMMUNITY)
Admission: EM | Admit: 2022-12-28 | Discharge: 2022-12-28 | Disposition: A | Discharge status: Home / Self Care | Payer: Medicare Other | Attending: Emergency Medicine | Admitting: Emergency Medicine

## 2022-12-28 ENCOUNTER — Emergency Department (HOSPITAL_COMMUNITY): Payer: Medicare Other

## 2022-12-28 ENCOUNTER — Other Ambulatory Visit: Payer: Self-pay

## 2022-12-28 ENCOUNTER — Encounter (HOSPITAL_COMMUNITY): Payer: Self-pay | Admitting: *Deleted

## 2022-12-28 DIAGNOSIS — Z79899 Other long term (current) drug therapy: Secondary | ICD-10-CM | POA: Diagnosis not present

## 2022-12-28 DIAGNOSIS — S299XXA Unspecified injury of thorax, initial encounter: Secondary | ICD-10-CM

## 2022-12-28 DIAGNOSIS — W101XXA Fall (on)(from) sidewalk curb, initial encounter: Secondary | ICD-10-CM | POA: Diagnosis not present

## 2022-12-28 DIAGNOSIS — Z7951 Long term (current) use of inhaled steroids: Secondary | ICD-10-CM | POA: Diagnosis not present

## 2022-12-28 DIAGNOSIS — J449 Chronic obstructive pulmonary disease, unspecified: Secondary | ICD-10-CM | POA: Insufficient documentation

## 2022-12-28 DIAGNOSIS — Y92480 Sidewalk as the place of occurrence of the external cause: Secondary | ICD-10-CM | POA: Diagnosis not present

## 2022-12-28 DIAGNOSIS — S0012XA Contusion of left eyelid and periocular area, initial encounter: Secondary | ICD-10-CM | POA: Insufficient documentation

## 2022-12-28 DIAGNOSIS — R911 Solitary pulmonary nodule: Secondary | ICD-10-CM | POA: Insufficient documentation

## 2022-12-28 DIAGNOSIS — I10 Essential (primary) hypertension: Secondary | ICD-10-CM | POA: Diagnosis not present

## 2022-12-28 DIAGNOSIS — C3490 Malignant neoplasm of unspecified part of unspecified bronchus or lung: Secondary | ICD-10-CM

## 2022-12-28 DIAGNOSIS — R0789 Other chest pain: Secondary | ICD-10-CM | POA: Diagnosis not present

## 2022-12-28 MED ORDER — LIDOCAINE 5 % EX PTCH: 1.0000 | MEDICATED_PATCH | CUTANEOUS | 0 refills | Status: DC

## 2022-12-28 MED ORDER — ACETAMINOPHEN 500 MG PO TABS
1000.0000 mg | ORAL_TABLET | Freq: Once | ORAL | Status: AC
  Administered 2022-12-28: 1000 mg via ORAL
  Filled 2022-12-28: qty 2

## 2022-12-28 MED ORDER — LIDOCAINE 5 % EX PTCH
1.0000 | MEDICATED_PATCH | CUTANEOUS | Status: DC
  Administered 2022-12-28: 1 via TRANSDERMAL
  Filled 2022-12-28: qty 1

## 2022-12-28 NOTE — ED Provider Notes (Signed)
[facility] Provider Note   CSN: [id number] Arrival date & time: 12/28/22  9323     History  Chief Complaint  Patient presents with   Rib Injury    Kathleen Pope is a 89 y.o. female with history of COPD, hypertension, hyperlipidemia who presents the emergency department after a fall.  Patient states that she was in a parking lot last night and tripped over cement curb, falling on to the left side of her chest.  She did not think that she struck her head, but noticed some bruising on the left side of her head last night when she was taking off her make-up.  She is complaining mostly about her rib pain.  Says it hurts worse to the touch, with movement, and deep breathing.  She is worried she may have pulled a muscle or cracked a rib.  She is not on chronic anticoagulation.  She denies headache, neck pain, back pain, blurry vision, weakness or numbness. She also has a lesion on her right thigh she would like evaluated as she says she thinks she likely needs to go to the dermatologist for it. Hx of squamous cell carcinoma.   HPI     Home Medications Prior to Admission medications   Medication Sig Start Date End Date Taking? Authorizing Provider  lidocaine (LIDODERM) 5 % Place 1 patch onto the skin daily. Remove & Discard patch within 12 hours or as directed by MD 12/28/22  Yes Alliyah Roesler T, PA-C  acetaminophen (TYLENOL) 500 MG tablet Take 2 tablets (1,000 mg total) by mouth every 6 (six) hours as needed. Patient taking differently: Take 1,000 mg by mouth every 6 (six) hours as needed for mild pain. 01/29/21   Saverio Danker, PA-C  amLODipine (NORVASC) 5 MG tablet Take 5 mg by mouth daily. 03/22/22   [provider]  calcium carbonate (OS-CAL) 600 MG TABS Take 600 mg by mouth daily.    [provider]  ECHINACEA PO Take 1 capsule by mouth daily.    [provider]  ferrous sulfate 325 (65 FE) MG EC tablet Take 325 mg by  mouth 2 (two) times daily.     [provider]  furosemide (LASIX) 20 MG tablet Take 1 tablet (20 mg total) by mouth daily as needed for fluid or edema. Take for weight gain of 3lbs or more in 24 hours or 5lbs or more over 7 days. Also for swelling or shortness of breath. 05/13/22 08/11/22  Manuella Ghazi, Pratik D, DO  gabapentin (NEURONTIN) 300 MG capsule Take 1 capsule (300 mg total) by mouth 4 (four) times daily. 03/04/22   Magnus Sinning, MD  Ipratropium-Albuterol (COMBIVENT RESPIMAT) 20-100 MCG/ACT AERS respimat Inhale 1 puff into the lungs every 6 (six) hours as needed for wheezing or shortness of breath. 05/13/22   Manuella Ghazi, Pratik D, DO  losartan (COZAAR) 50 MG tablet Take 50 mg by mouth daily. 03/22/22   [provider]  metoprolol tartrate (LOPRESSOR) 25 MG tablet Take 0.5 tablets (12.5 mg total) by mouth 2 (two) times daily. 01/25/21   Rai, Vernelle Emerald, MD  Omega-3 Fatty Acids (FISH OIL PO) Take 1 tablet by mouth daily.    [provider]  pravastatin (PRAVACHOL) 10 MG tablet Take 10 mg by mouth daily.    [provider]      Allergies    Patient has no known allergies.    Review of Systems   Review of Systems  Eyes:  Negative for visual disturbance.  Musculoskeletal:  Negative for back pain, neck pain and neck stiffness.       Chest wall pain  Neurological:  Negative for dizziness, weakness, light-headedness, numbness and headaches.  All other systems reviewed and are negative.   Physical Exam Updated Vital Signs BP (!) 158/74   Pulse 71   Temp 98.2 F (36.8 C)   Resp 18   Ht 5\' 1"  (1.549 m)   Wt 41.3 kg   SpO2 100%   BMI 17.19 kg/m  Physical Exam Vitals and nursing note reviewed.  Constitutional:      Appearance: Normal appearance.  HENT:     Head: Normocephalic.     Comments: Small ecchymoses noted just lateral to the left eye and left temple. Non-tender to palpation, no deformity.  Eyes:     Conjunctiva/sclera: Conjunctivae normal.  Neck:      Comments: No cervical midline spinal tenderness, step offs or crepitus Cardiovascular:     Rate and Rhythm: Normal rate and regular rhythm.  Pulmonary:     Effort: Pulmonary effort is normal. No respiratory distress.     Breath sounds: Normal breath sounds.  Chest:     Comments: Left sided anterior chest wall tenderness to palpation, just below the breasts. No deformities or overlying skin changes. Made worse with deep breathing Abdominal:     General: There is no distension.     Palpations: Abdomen is soft.     Tenderness: There is no abdominal tenderness.  Musculoskeletal:     Cervical back: Full passive range of motion without pain.  Skin:    General: Skin is warm and dry.     Comments: Raised scaling macular lesion to the right thigh with mild erythema  Neurological:     General: No focal deficit present.     Mental Status: She is alert.     ED Results / Procedures / Treatments   Labs (all labs ordered are listed, but only abnormal results are displayed) Labs Reviewed - No data to display  EKG None  Radiology CT Chest Wo Contrast  Result Date: 12/28/2022 CLINICAL DATA:  Fall today.  Left anterior chest wall pain. EXAM: CT CHEST WITHOUT CONTRAST TECHNIQUE: Multidetector CT imaging of the chest was performed following the standard protocol without IV contrast. RADIATION DOSE REDUCTION: This exam was performed according to the departmental dose-optimization program which includes automated exposure control, adjustment of the mA and/or kV according to patient size and/or use of iterative reconstruction technique. COMPARISON:  Chest radiographs 05/12/2022 and 01/17/2021. Abdominal CT 01/11/2021. No prior chest CT. FINDINGS: Cardiovascular: Diffuse atherosclerosis of the aorta, great vessels and coronary arteries. There are calcifications of the aortic valve and mitral annulus. The heart size is normal. There is no pericardial effusion. Mediastinum/Nodes: There are no enlarged  mediastinal, hilar or axillary lymph nodes.Hilar assessment is limited by the lack of intravenous contrast. The thyroid gland, trachea and esophagus demonstrate no significant findings. Lungs/Pleura: No pleural effusion or pneumothorax. The pleural effusions seen on prior chest radiographs have resolved. There is underlying moderate centrilobular and paraseptal emphysema. A spiculated mass anteriorly in the right lung measures 2.6 x 1.8 cm on image 96/4 and is highly suspicious for bronchogenic carcinoma. This appears to cross the minor fissure and involve both the upper and middle lobes. No other highly suspicious pulmonary nodularity is identified. There is ill-defined nodularity in the superior segment of the right lower lobe measuring up to 1.1 x 0.4 cm on  image 68/4. Upper abdomen: No acute findings are seen in the visualized upper abdomen. The adrenal glands appear unchanged from previous abdominal CT. Musculoskeletal/Chest wall: No acute rib fractures are identified. There is an old fracture of the left 6th rib laterally. No evidence of chest wall mass or osseous metastatic disease. There is a thoracolumbar scoliosis with upper lumbar spondylosis. IMPRESSION: 1. No acute posttraumatic findings identified in the chest. 2. Spiculated mass anteriorly in the right lung is highly suspicious for bronchogenic carcinoma. This appears to cross the minor fissure and involve both the upper and middle lobes. Recommend referral to multi disciplinary thoracic oncology service. PET-CT may be helpful for further staging. 3. Ill-defined nodularity in the superior segment of the right lower lobe is nonspecific, potentially inflammatory. 4. Old left-sided rib fracture. No acute osseous findings identified. 5. Aortic Atherosclerosis (ICD10-I70.0) and Emphysema (ICD10-J43.9). Electronically Signed   By: Richardean Sale M.D.   On: 12/28/2022 11:22    Procedures Procedures    Medications Ordered in ED Medications  lidocaine  (LIDODERM) 5 % 1 patch (1 patch Transdermal Patch Applied 12/28/22 1113)  acetaminophen (TYLENOL) tablet 1,000 mg (1,000 mg Oral Given 12/28/22 1113)    ED Course/ Medical Decision Making/ A&P Clinical Course as of 12/28/22 1459  Tue Dec 28, 2022  1051 The patient has decided to refuse the head CT and C-spine CT because she feels they are necessary given her mechanism of injury.  She does have adequate capacity to make medical decisions at this time. The risks of refusing the procedure have been explained to the patient, including TBI, C-spine injury, chronic pain, permanent disability and death. The benefits of the procedure/test have also been explained. The patient was able to understand and state the risks and benefits and had the opportunity to ask questions. The patient was treated to the extent that they would allow and knows that they may change their mind about treatment and diagnostic tests in the future.  [RP]    Clinical Course User Index [RP] Fransico Meadow, MD                             Medical Decision Making Amount and/or Complexity of Data Reviewed Radiology: ordered.  Risk OTC drugs. Prescription drug management.  This patient is a 89 y.o. female  who presents to the ED for concern of left sided rib pain after fall yesterday evening.   Differential diagnoses prior to evaluation: The emergent differential diagnosis includes, but is not limited to,  rib fracture, pneumothorax, muscle strain, rib subluxation. This is not an exhaustive differential.   Past Medical History / Co-morbidities: COPD, hypertension, hyperlipidemia  Physical Exam: Physical exam performed. The pertinent findings include: tenderness to palpation over left anterior chest wall just below the breast. No skin changes. Lung sounds clear.   Lab Tests/Imaging studies: I personally interpreted labs/imaging and the pertinent results include: CT chest shows old left sixth rib fracture, no acute medic  findings.  Spiculated mass in the right lobe measuring 1.8 x 2.6 cm, concerning for bronchogenic carcinoma. I agree with the radiologist interpretation.  Patient declined CT imaging of the head and cervical spine but will agree to CT chest wo contrast to evaluate for rib fractures. I believe she demonstrates capacity to make medical decisions in refusing these images.   Medications: I ordered medication including Tylenol and lidocaine patch.  I have reviewed the patients home medicines and have made adjustments  as needed.   Disposition: After consideration of the diagnostic results and the patients response to treatment, I feel that emergency department workup does not suggest an emergent condition requiring admission or immediate intervention beyond what has been performed at this time. The plan is: Discharged home with symptomatic management of blunt chest wall injury without fracture.  Counseled the patient on results of her CT scan with concern for bronchogenic carcinoma of the right lung.  Provided with oncology follow-up.  She admits that she would not want to go through chemotherapy or radiation, and I said she would benefit from really speaking to the oncologist about her options and getting a definitive diagnosis.  She is agreeable to this.. The patient is safe for discharge and has been instructed to return immediately for worsening symptoms, change in symptoms or any other concerns.  I discussed this case with my attending physician Dr. Philip Aspen who cosigned this note including patient's presenting symptoms, physical exam, and planned diagnostics and interventions. Attending physician stated agreement with plan or made changes to plan which were implemented.   Final Clinical Impression(s) / ED Diagnoses Final diagnoses:  Rib injury  Lung nodule seen on imaging study    Rx / DC Orders ED Discharge Orders          Ordered    lidocaine (LIDODERM) 5 %  Every 24 hours        12/28/22  1142           Portions of this report may have been transcribed using voice recognition software. Every effort was made to ensure accuracy; however, inadvertent computerized transcription errors may be present.    Estill Cotta 12/28/22 1459    Fransico Meadow, MD 01/02/23 [phone]

## 2022-12-28 NOTE — ED Triage Notes (Signed)
Pt fell last night and had more pain this morning while breathing and cough on the left side; pt states she stripped over a curb, pt didn't think she hit her head, but states there is a slight bruise on her left side of her head; pt denies she taking any blood thinners.

## 2022-12-28 NOTE — Progress Notes (Signed)
PET scan and brain MRI order placed per Dr. Delton Coombes

## 2022-12-28 NOTE — Discharge Instructions (Addendum)
You were seen in the ER after a fall with rib injury.  As we discussed, you do not have any new fractures of your ribs although it does look like you have an old fracture of your left 6th rib. I recommend continuing to wear lidocaine patches over the area and taking tylenol as needed for pain. Continue using the incentive spirometer as shown to make sure your lungs are filling adequately and to prevent developing pneumonia.  You also have a lesion on your right lung that the radiologist is concerned about cancer. The lesion is 1.8 x 2.6 cm. I've attached the contact information for the oncologist at this hospital. Please call them once you get home to set up a follow up appointment.   Continue to monitor how you're doing and return to the ER for new or worsening symptoms.

## 2023-01-02 DIAGNOSIS — J984 Other disorders of lung: Secondary | ICD-10-CM | POA: Insufficient documentation

## 2023-01-02 HISTORY — DX: Other disorders of lung: J98.4

## 2023-01-03 ENCOUNTER — Encounter: Payer: Self-pay | Admitting: Hematology

## 2023-01-03 ENCOUNTER — Inpatient Hospital Stay: Payer: Medicare Other | Attending: Hematology | Admitting: Hematology

## 2023-01-03 VITALS — BP 146/83 | HR 68 | Temp 97.9°F | Resp 17 | Ht 61.5 in | Wt 92.3 lb

## 2023-01-03 DIAGNOSIS — R918 Other nonspecific abnormal finding of lung field: Secondary | ICD-10-CM | POA: Insufficient documentation

## 2023-01-03 DIAGNOSIS — Z803 Family history of malignant neoplasm of breast: Secondary | ICD-10-CM | POA: Diagnosis not present

## 2023-01-03 DIAGNOSIS — J984 Other disorders of lung: Secondary | ICD-10-CM

## 2023-01-03 NOTE — Progress Notes (Signed)
[facility] [address], [place] [address]   Clinic Day:  01/03/2023  Referring physician: Redmond School, MD  Patient Care Team: Redmond School, MD as PCP - General (Internal Medicine) Valente David, RN as [facility] Oletta Lamas, Garwin Brothers, RN as Oncology Nurse Navigator (Medical Oncology) Derek Jack, MD as Medical Oncologist (Medical Oncology)   ASSESSMENT & PLAN:   Assessment:  1.  Right lung mass: - Presented to the ER with left rib pain on 12/28/2022 from a mechanical fall. - CT chest without contrast (12/28/2022): 2.6 x 1.8 cm spiculated mass in the right lung suspicious for bronchogenic carcinoma.  This appears to cross the minor fissure and involve both upper and middle lobes.  Ill-defined nodularity in the superior segment of the right lower lobe nonspecific, potentially inflammatory.  Old left-sided rib fractures. - No fevers, night sweats or weight loss.  No hemoptysis. - She had a history of total colectomy in 2019 for bleeding.  She has ileostomy.  2.  Social/family history: - Lives by herself.  She is accompanied by her nephew Dwayne today.  She is using cane since her fall.  She is independent of ADLs and IADLs.  She also fixes dinner to her brother daily who lives close by.  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  She is current active smoker, smokes less than 1 pack/day for 70 years. - Mother and sister had breast cancers.   Plan:  1.  Right lung mass: - I have discussed and reviewed images of the CT scan with the patient in detail. - Recommend PET CT scan and brain MRI with and without contrast for staging. - Discussed biopsy with navigational bronchoscopy. - She would like to wait until she gets the results of PET scan. - Recommend follow-up after the scans.   No orders of the defined types were placed in this encounter.     Beverly Gust Oliver,acting as a scribe for Derek Jack, MD.,have documented all relevant documentation on the behalf of Derek Jack, MD,as directed by  Derek Jack, MD while in the presence of Derek Jack, MD.   I, Derek Jack MD, have reviewed the above documentation for accuracy and completeness, and I agree with the above.   Derek Jack, MD   2/19/20246:43 PM  CHIEF COMPLAINT/PURPOSE OF CONSULT:   Diagnosis: right lung mass   Cancer Staging  No matching staging information was found for the patient.   Prior Therapy: None  Current Therapy: Under workup   HISTORY OF PRESENT ILLNESS:   Oncology History   No history exists.      Kathleen Pope is a 89 y.o. female presenting to clinic today for evaluation of right lung mass at the request of ED PA Lorin Roemhildt.  She [place] church [place] and presented to the ER on 12/28/2022 with left-sided chest wall pain.  A CT scan showed right upper and middle lobe mass.  She denied any fevers, night sweats or weight loss in the preceding 6 months.  Denied any hemoptysis.  Today, she states that she is doing well overall. Her appetite level is at 100%. Her energy level is at 20%. She is here today with her nephew. She has 5/10 left breast pain underneath ribcage. She said the pain has gotten better some but is still present. She is still able to complete daily activities. She denies doing a lot of house work. She denies unintentional  weight loss, coughing up blood, and metal in body .She currently lives alone. Since her recent fell she started using a cane. She gets shots and injections to help with leg pain.  She did office work prior to retirement. She smoked less than a pack a day for 7 years. Her mom and sister had breast caner.  PAST MEDICAL HISTORY:   Past Medical History: Past Medical History:  Diagnosis Date   Anemia    pt denies, yet takes iron   Chronic airway obstruction, not elsewhere classified    COPD (chronic  obstructive pulmonary disease) ([facility])    Diverticulosis    GI hemorrhage from post-treatment cecal ulcer 08/18/2012   Hemorrhoids    HTN (hypertension)    Hypercholesteremia    Lower GI bleed multiple   AVM's    Surgical History: Past Surgical History:  Procedure Laterality Date   BUNIONECTOMY     left   COLECTOMY     COLON RESECTION N/A 01/17/2021   Procedure: EXPLORATORY LAPAROTOMY COMPLETION COLECTOMY ileostomy;  Surgeon: Ralene Ok, MD;  Location: [facility];  Service: General;  Laterality: N/A;   COLONOSCOPY  08/10/2012   Procedure: COLONOSCOPY;  Surgeon: Ladene Artist, MD,FACG;  Location: [facility];  Service: Endoscopy;  Laterality: N/A;   COLONOSCOPY  08/19/2012   Procedure: COLONOSCOPY;  Surgeon: Gatha Mayer, MD;  Location: [facility];  Service: Endoscopy;  Laterality: N/A;   COLONOSCOPY N/A 03/11/2013   Procedure: COLONOSCOPY;  Surgeon: Irene Shipper, MD;  Location: [facility];  Service: Endoscopy;  Laterality: N/A;   COLONOSCOPY N/A 01/16/2021   Procedure: COLONOSCOPY;  Surgeon: Doran Stabler, MD;  Location: [facility];  Service: Gastroenterology;  Laterality: N/A;   COLONOSCOPY WITH PROPOFOL N/A 01/10/2021   Procedure: COLONOSCOPY WITH PROPOFOL;  Surgeon: Irene Shipper, MD;  [facility]: [facility];  Service: Endoscopy;  Laterality: N/A;   COLOSTOMY N/A 01/15/2021   Procedure: COLOSTOMY;  Surgeon: Erroll Luna, MD;  Location: [facility];  Service: General;  Laterality: N/A;   ESOPHAGOGASTRODUODENOSCOPY (EGD) WITH PROPOFOL N/A 01/14/2021   Procedure: ESOPHAGOGASTRODUODENOSCOPY (EGD) WITH PROPOFOL;  Surgeon: Doran Stabler, MD;  [facility];  Service: Endoscopy;  Laterality: N/A;   FLEXIBLE SIGMOIDOSCOPY N/A 01/14/2021   Procedure: FLEXIBLE SIGMOIDOSCOPY;  Surgeon: Doran Stabler, MD;  [facility];  Service: Endoscopy;  Laterality: N/A;   HOT HEMOSTASIS N/A 01/10/2021   Procedure: HOT HEMOSTASIS (ARGON PLASMA COAGULATION/BICAP);  Surgeon:  Irene Shipper, MD;  Location: [facility];  Service: Endoscopy;  Laterality: N/A;   IR ANGIOGRAM SELECTIVE EACH ADDITIONAL VESSEL  01/12/2021   IR ANGIOGRAM SELECTIVE EACH ADDITIONAL VESSEL  01/12/2021   IR ANGIOGRAM SELECTIVE EACH ADDITIONAL VESSEL  01/12/2021   IR ANGIOGRAM VISCERAL SELECTIVE  01/09/2021   IR ANGIOGRAM VISCERAL SELECTIVE  01/12/2021   IR EMBO ARTERIAL NOT HEMORR HEMANG INC GUIDE ROADMAPPING  01/12/2021   IR US GUIDE VASC ACCESS RIGHT  01/09/2021   IR US GUIDE VASC ACCESS RIGHT  01/12/2021   PARTIAL COLECTOMY N/A 01/15/2021   Procedure: PARTIAL COLECTOMY;  Surgeon: Erroll Luna, MD;  Location: [facility];  Service: General;  Laterality: N/A;   POLYPECTOMY  01/10/2021   Procedure: POLYPECTOMY;  Surgeon: Irene Shipper, MD;  [facility]: [facility];  Service: Endoscopy;;   TONSILLECTOMY      Social History: Social History   Socioeconomic History   Marital status: Widowed    Spouse name: Not on file   Number of children: 1  Years of education: Not on file   Highest education level: Not on file  Occupational History   Occupation: retired    Fish farm manager: RETIRED  Tobacco Use   Smoking status: Every Day    Packs/day: 1.00    Years: 59.00    Total pack years: 59.00    Types: Cigarettes   Smokeless tobacco: Never   Tobacco comments:    tobacco info given 06/09/18  Vaping Use   Vaping Use: Never used  Substance and Sexual Activity   Alcohol use: No   Drug use: No   Sexual activity: Never  Other Topics Concern   Not on file  Social History Narrative   Not on file   Social Determinants of Health   Financial Resource Strain: Not on file  Food Insecurity: No Food Insecurity (01/03/2023)   Hunger Vital Sign    Worried About Running Out of Food in the Last Year: Never true    [place] in the Last Year: Never true  Transportation Needs: No Transportation Needs (01/03/2023)   PRAPARE - Hydrologist (Medical): No    Lack of Transportation  (Non-Medical): No  Physical Activity: Not on file  Stress: Not on file  Social Connections: Not on file  Intimate Partner Violence: Not At Risk (01/03/2023)   Humiliation, Afraid, Rape, and Kick questionnaire    Fear of Current or Ex-Partner: No    Emotionally Abused: No    Physically Abused: No    Sexually Abused: No    Family History: Family History  Problem Relation Age of Onset   Breast cancer Mother    Lung cancer Father    Breast cancer Sister    Anuerysm Brother        in stomach   Colon cancer Neg Hx    Esophageal cancer Neg Hx    Rectal cancer Neg Hx     Current Medications:  Current Outpatient Medications:    acetaminophen (TYLENOL) 500 MG tablet, Take 2 tablets (1,000 mg total) by mouth every 6 (six) hours as needed. (Patient taking differently: Take 1,000 mg by mouth every 6 (six) hours as needed for mild pain.), Disp: 30 tablet, Rfl: 0   amLODipine (NORVASC) 5 MG tablet, Take 5 mg by mouth daily., Disp: , Rfl:    calcium carbonate (OS-CAL) 600 MG TABS, Take 600 mg by mouth daily., Disp: , Rfl:    ECHINACEA PO, Take 1 capsule by mouth daily., Disp: , Rfl:    ferrous sulfate 325 (65 FE) MG EC tablet, Take 325 mg by mouth 2 (two) times daily. , Disp: , Rfl:    gabapentin (NEURONTIN) 300 MG capsule, Take 1 capsule (300 mg total) by mouth 4 (four) times daily., Disp: 120 capsule, Rfl: 3   Ipratropium-Albuterol (COMBIVENT RESPIMAT) 20-100 MCG/ACT AERS respimat, Inhale 1 puff into the lungs every 6 (six) hours as needed for wheezing or shortness of breath., Disp: 4 g, Rfl: 2   lidocaine (LIDODERM) 5 %, Place 1 patch onto the skin daily. Remove & Discard patch within 12 hours or as directed by MD, Disp: 30 patch, Rfl: 0   losartan (COZAAR) 50 MG tablet, Take 50 mg by mouth daily., Disp: , Rfl:    metoprolol tartrate (LOPRESSOR) 25 MG tablet, Take 0.5 tablets (12.5 mg total) by mouth 2 (two) times daily., Disp: 60 tablet, Rfl: 1   Omega-3 Fatty Acids (FISH OIL PO), Take 1  tablet by mouth daily., Disp: , Rfl:  pravastatin (PRAVACHOL) 10 MG tablet, Take 10 mg by mouth daily., Disp: , Rfl:    furosemide (LASIX) 20 MG tablet, Take 1 tablet (20 mg total) by mouth daily as needed for fluid or edema. Take for weight gain of 3lbs or more in 24 hours or 5lbs or more over 7 days. Also for swelling or shortness of breath., Disp: 30 tablet, Rfl: 2   Allergies: No Known Allergies  REVIEW OF SYSTEMS:   Review of Systems  Constitutional:  Negative for chills, fatigue and fever.  HENT:   Negative for lump/mass, mouth sores, nosebleeds, sore throat and trouble swallowing.   Eyes:  Negative for eye problems.  Respiratory:  Positive for cough (COPD). Negative for shortness of breath.   Cardiovascular:  Negative for chest pain, leg swelling and palpitations.  Gastrointestinal:  Positive for abdominal distention. Negative for abdominal pain, constipation, diarrhea, nausea and vomiting.  Genitourinary:  Negative for bladder incontinence, difficulty urinating, dysuria, frequency, hematuria and nocturia.   Musculoskeletal:  Negative for arthralgias, back pain, flank pain, myalgias and neck pain.  Skin:  Negative for itching and rash.  Neurological:  Positive for dizziness (Vertigo). Negative for headaches, numbness and speech difficulty.  Hematological:  Does not bruise/bleed easily.  Psychiatric/Behavioral:  Negative for depression, sleep disturbance and suicidal ideas. The patient is not nervous/anxious.   All other systems reviewed and are negative.    VITALS:   Blood pressure (!) 146/83, pulse 68, temperature 97.9 F (36.6 C), temperature source Tympanic, resp. rate 17, height 5' 1.5" (1.562 m), weight 92 lb 4.8 oz (41.9 kg), SpO2 99 %.  Wt Readings from Last 3 Encounters:  01/03/23 92 lb 4.8 oz (41.9 kg)  12/28/22 91 lb (41.3 kg)  05/13/22 88 lb 8 oz (40.1 kg)    Body mass index is 17.16 kg/m.  Performance status (ECOG): 1 - Symptomatic but completely  ambulatory  PHYSICAL EXAM:   Physical Exam Vitals and nursing note reviewed. Exam conducted with a chaperone present.  Constitutional:      Appearance: Normal appearance.  Cardiovascular:     Rate and Rhythm: Normal rate and regular rhythm.     Pulses: Normal pulses.     Heart sounds: Murmur heard.     Systolic murmur is present.  Pulmonary:     Effort: Pulmonary effort is normal.     Breath sounds: Normal breath sounds.  Abdominal:     Palpations: Abdomen is soft. There is no hepatomegaly, splenomegaly or mass.     Tenderness: There is no abdominal tenderness.  Musculoskeletal:     Right lower leg: No edema.     Left lower leg: No edema.  Lymphadenopathy:     Cervical: No cervical adenopathy.     Right cervical: No superficial, deep or posterior cervical adenopathy.    Left cervical: No superficial, deep or posterior cervical adenopathy.     Upper Body:     Right upper body: No supraclavicular or axillary adenopathy.     Left upper body: No supraclavicular or axillary adenopathy.  Neurological:     General: No focal deficit present.     Mental Status: She is alert and oriented to person, place, and time.  Psychiatric:        Mood and Affect: Mood normal.        Behavior: Behavior normal.     LABS:      Latest Ref Rng & Units 05/13/2022    3:52 AM 05/12/2022   12:27 PM 05/12/2022  8:15 AM  CBC  WBC 4.0 - 10.5 K/uL 12.4  10.7  10.8   Hemoglobin 12.0 - 15.0 g/dL 11.4  13.6  13.0   Hematocrit 36.0 - 46.0 % 33.6  40.1  37.1   Platelets 150 - 400 K/uL 376  439  416       Latest Ref Rng & Units 05/13/2022    3:52 AM 05/12/2022   12:27 PM 05/12/2022    8:15 AM  CMP  Glucose 70 - 99 mg/dL 89   112   BUN 8 - 23 mg/dL 26   13   Creatinine 0.44 - 1.00 mg/dL 0.91  0.73  0.73   Sodium 135 - 145 mmol/L 137   133   Potassium 3.5 - 5.1 mmol/L 4.3   3.9   Chloride 98 - 111 mmol/L 105   99   CO2 22 - 32 mmol/L 25   25   Calcium 8.9 - 10.3 mg/dL 9.2   9.5   Total Protein 6.5  - 8.1 g/dL   7.4   Total Bilirubin 0.3 - 1.2 mg/dL   0.8   Alkaline Phos 38 - 126 U/L   62   AST 15 - 41 U/L   21   ALT 0 - 44 U/L   13      No results found for: "CEA1", "CEA" / No results found for: "CEA1", "CEA" No results found for: "PSA1" No results found for: "QQI297" No results found for: "CAN125"  No results found for: "TOTALPROTELP", "ALBUMINELP", "A1GS", "A2GS", "BETS", "BETA2SER", "GAMS", "MSPIKE", "SPEI" No results found for: "TIBC", "FERRITIN", "IRONPCTSAT" No results found for: "LDH"   STUDIES:   CT Chest Wo Contrast  Result Date: 12/28/2022 CLINICAL DATA:  Fall today.  Left anterior chest wall pain. EXAM: CT CHEST WITHOUT CONTRAST TECHNIQUE: Multidetector CT imaging of the chest was performed following the standard protocol without IV contrast. RADIATION DOSE REDUCTION: This exam was performed according to the departmental dose-optimization program which includes automated exposure control, adjustment of the mA and/or kV according to patient size and/or use of iterative reconstruction technique. COMPARISON:  Chest radiographs 05/12/2022 and 01/17/2021. Abdominal CT 01/11/2021. No prior chest CT. FINDINGS: Cardiovascular: Diffuse atherosclerosis of the aorta, great vessels and coronary arteries. There are calcifications of the aortic valve and mitral annulus. The heart size is normal. There is no pericardial effusion. Mediastinum/Nodes: There are no enlarged mediastinal, hilar or axillary lymph nodes.Hilar assessment is limited by the lack of intravenous contrast. The thyroid gland, trachea and esophagus demonstrate no significant findings. Lungs/Pleura: No pleural effusion or pneumothorax. The pleural effusions seen on prior chest radiographs have resolved. There is underlying moderate centrilobular and paraseptal emphysema. A spiculated mass anteriorly in the right lung measures 2.6 x 1.8 cm on image 96/4 and is highly suspicious for bronchogenic carcinoma. This appears to cross  the minor fissure and involve both the upper and middle lobes. No other highly suspicious pulmonary nodularity is identified. There is ill-defined nodularity in the superior segment of the right lower lobe measuring up to 1.1 x 0.4 cm on image 68/4. Upper abdomen: No acute findings are seen in the visualized upper abdomen. The adrenal glands appear unchanged from previous abdominal CT. Musculoskeletal/Chest wall: No acute rib fractures are identified. There is an old fracture of the left 6th rib laterally. No evidence of chest wall mass or osseous metastatic disease. There is a thoracolumbar scoliosis with upper lumbar spondylosis. IMPRESSION: 1. No acute posttraumatic findings identified in the chest.  2. Spiculated mass anteriorly in the right lung is highly suspicious for bronchogenic carcinoma. This appears to cross the minor fissure and involve both the upper and middle lobes. Recommend referral to multi disciplinary thoracic oncology service. PET-CT may be helpful for further staging. 3. Ill-defined nodularity in the superior segment of the right lower lobe is nonspecific, potentially inflammatory. 4. Old left-sided rib fracture. No acute osseous findings identified. 5. Aortic Atherosclerosis (ICD10-I70.0) and Emphysema (ICD10-J43.9). Electronically Signed   By: Richardean Sale M.D.   On: 12/28/2022 11:22

## 2023-01-03 NOTE — Patient Instructions (Addendum)
[facility]  Discharge Instructions  You were seen and examined today by Dr. Delton Coombes. Dr. Delton Coombes is a medical oncologist, meaning that he specializes in the treatment of cancer diagnoses. Dr. Delton Coombes discussed your past medical history, family history of cancers, and the events that led to you being here today.  You were referred to Dr. Delton Coombes due to an abnormal CT scan that revealed a mass in your lung concerning for cancer.  Dr. Delton Coombes has recommended a PET scan and a brain MRI to complete the staging work-up. A PET scan is a specialized CT scan that covers the whole body and illuminates where there is cancer present in the body. This, in addition to the MRI, will accurately identify the stage of the cancer.  The PET scan will also identify the safest area to biopsy.  Follow-up as scheduled.  Thank you for choosing [facility] to provide your oncology and hematology care.   To afford each patient quality time with our provider, please arrive at least 15 minutes before your scheduled appointment time. You may need to reschedule your appointment if you arrive late (10 or more minutes). Arriving late affects you and other patients whose appointments are after yours.  Also, if you miss three or more appointments without notifying the office, you may be dismissed from the clinic at the provider's discretion.    Again, thank you for choosing [facility].  Our hope is that these requests will decrease the amount of time that you wait before being seen by our physicians.   If you have a lab appointment with the [facility] please come in thru the Main Entrance and check in at the main information desk.           _____________________________________________________________  Should you have questions after your visit to [facility], please contact our office at [phone] and follow the prompts.   Our office hours are 8:00 a.m. to 4:30 p.m. Monday - Thursday and 8:00 a.m. to 2:30 p.m. Friday.  Please note that voicemails left after 4:00 p.m. may not be returned until the following business day.  We are closed weekends and all major holidays.  You do have access to a nurse 24-7, just call the main number to the clinic [phone] and do not press any options, hold on the line and a nurse will answer the phone.    For prescription refill requests, have your pharmacy contact our office and allow 72 hours.    Masks are optional in the cancer centers. If you would like for your care team to wear a mask while they are taking care of you, please let them know. You may have one support person who is at least 87 years old accompany you for your appointments.

## 2023-01-06 ENCOUNTER — Ambulatory Visit (HOSPITAL_COMMUNITY)
Admission: RE | Admit: 2023-01-06 | Discharge: 2023-01-06 | Disposition: A | Discharge status: Home / Self Care | Payer: Medicare Other | Source: Ambulatory Visit | Attending: Hematology | Admitting: Hematology

## 2023-01-06 DIAGNOSIS — C3490 Malignant neoplasm of unspecified part of unspecified bronchus or lung: Secondary | ICD-10-CM | POA: Diagnosis not present

## 2023-01-06 DIAGNOSIS — D3502 Benign neoplasm of left adrenal gland: Secondary | ICD-10-CM | POA: Diagnosis not present

## 2023-01-06 DIAGNOSIS — I2510 Atherosclerotic heart disease of native coronary artery without angina pectoris: Secondary | ICD-10-CM | POA: Diagnosis not present

## 2023-01-06 DIAGNOSIS — S2242XA Multiple fractures of ribs, left side, initial encounter for closed fracture: Secondary | ICD-10-CM | POA: Diagnosis not present

## 2023-01-06 MED ORDER — FLUDEOXYGLUCOSE F - 18 (FDG) INJECTION
5.6500 | Freq: Once | INTRAVENOUS | Status: AC | PRN
  Administered 2023-01-06: 5.65 via INTRAVENOUS

## 2023-01-13 ENCOUNTER — Encounter: Payer: Self-pay | Admitting: Radiology

## 2023-01-14 ENCOUNTER — Ambulatory Visit (HOSPITAL_BASED_OUTPATIENT_CLINIC_OR_DEPARTMENT_OTHER)
Admission: RE | Admit: 2023-01-14 | Discharge: 2023-01-14 | Disposition: A | Discharge status: Home / Self Care | Payer: Medicare Other | Source: Ambulatory Visit | Attending: Hematology | Admitting: Hematology

## 2023-01-14 DIAGNOSIS — C3490 Malignant neoplasm of unspecified part of unspecified bronchus or lung: Secondary | ICD-10-CM

## 2023-01-14 MED ORDER — GADOBUTROL 1 MMOL/ML IV SOLN
4.0000 mL | Freq: Once | INTRAVENOUS | Status: AC | PRN
  Administered 2023-01-14: 4 mL via INTRAVENOUS
  Filled 2023-01-14: qty 4

## 2023-01-16 DIAGNOSIS — C3491 Malignant neoplasm of unspecified part of right bronchus or lung: Secondary | ICD-10-CM | POA: Insufficient documentation

## 2023-01-16 NOTE — Progress Notes (Signed)
[facility] [address] [place] [address]   Clinic Day:  01/17/2023  Referring physician: Redmond School, MD  Patient Care Team: Redmond School, MD as PCP - General (Internal Medicine) Valente David, RN as [place] [facility] Oletta Lamas, Garwin Brothers, RN as Oncology Nurse Navigator (Medical Oncology) Derek Jack, MD as Medical Oncologist (Medical Oncology)   ASSESSMENT & PLAN:   Assessment:  1.  Right lung mass: - Presented to the ER with left rib pain on 12/28/2022 from a mechanical fall. - CT chest without contrast (12/28/2022): 2.6 x 1.8 cm spiculated mass in the right lung suspicious for bronchogenic carcinoma.  This appears to cross the minor fissure and involve both upper and middle lobes.  Ill-defined nodularity in the superior segment of the right lower lobe nonspecific, potentially inflammatory.  Old left-sided rib fractures. - No fevers, night sweats or weight loss.  No hemoptysis. - She had a history of total colectomy in 2019 for bleeding.  She has ileostomy.  2.  Social/family history: - Lives by herself.  She is accompanied by her nephew Dwayne today.  She is using cane since her fall.  She is independent of ADLs and IADLs.  She also fixes dinner to her brother daily who lives close by.  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  She is current active smoker, smokes less than 1 pack/day for 70 years. - Mother and sister had breast cancers.   Plan:  1.  T2 N0 right lung cancer: - I have reviewed MRI of the brain results which were negative for metastatic disease. - I have reviewed images of the PET scan with the patient.  2.2 x 4.5 cm right middle lobe lung mass with SUV 13.4 consistent with malignancy.  There is also hypermetabolic 6 mm right parotid nodule.  Vague 8 mm nodule in the right lower lobe too small for PET scan. - She does not want surgical resection. - I have recommended bronchoscopy and biopsy by  your pulmonologist Drs. Icard/Byrum. - RTC after biopsy. - Will likely recommend SBRT.   No orders of the defined types were placed in this encounter.   I,Alexis Herring,acting as a Education administrator for [employer], MD.,have documented all relevant documentation on the behalf of Derek Jack, MD,as directed by  Derek Jack, MD while in the presence of Derek Jack, MD.  I, Derek Jack MD, have reviewed the above documentation for accuracy and completeness, and I agree with the above.    Derek Jack, MD   3/4/20247:17 PM  CHIEF COMPLAINT/PURPOSE OF CONSULT:   Diagnosis: right lung mass   Cancer Staging  Bronchogenic cancer of right lung [facility]) Staging form: Lung, AJCC 8th Edition - Clinical stage from 01/16/2023: Stage IIA (cT2b, cN0, cM0) - Unsigned    Prior Therapy: None  Current Therapy: Under workup   HISTORY OF PRESENT ILLNESS:   Oncology History   No history exists.    INTERVAL HISTORY:   Kathleen Pope is a 89 y.o. female presenting to clinic today for follow up for right lung mass. She was last seen by me for consult on 01/03/23 after incidental findings in the ED s/p a fall.  She has since had a NM PET scan and MRI Brain. We discussed these results today.  Today, she states that she is doing well overall. Her appetite level is at 100%. Her energy level is at 0%.  PAST MEDICAL HISTORY:   Past Medical History: Past Medical History:  Diagnosis Date   Anemia    pt denies, yet takes iron   Chronic airway obstruction, not elsewhere classified    COPD (chronic obstructive pulmonary disease) ([place])    Diverticulosis    GI hemorrhage from post-treatment cecal ulcer 08/18/2012   Hemorrhoids    HTN (hypertension)    Hypercholesteremia    Lower GI bleed multiple   AVM's    Surgical History: Past Surgical History:  Procedure Laterality Date   BUNIONECTOMY     left   COLECTOMY     COLON RESECTION N/A 01/17/2021   Procedure:  EXPLORATORY LAPAROTOMY COMPLETION COLECTOMY ileostomy;  Surgeon: Ralene Ok, MD;  Location: [facility];  Service: General;  Laterality: N/A;   COLONOSCOPY  08/10/2012   Procedure: COLONOSCOPY;  Surgeon: Ladene Artist, MD,FACG;  Location: [facility];  Service: Endoscopy;  Laterality: N/A;   COLONOSCOPY  08/19/2012   Procedure: COLONOSCOPY;  Surgeon: Gatha Mayer, MD;  Location: [facility];  Service: Endoscopy;  Laterality: N/A;   COLONOSCOPY N/A 03/11/2013   Procedure: COLONOSCOPY;  Surgeon: Irene Shipper, MD;  [facility]: [facility];  Service: Endoscopy;  Laterality: N/A;   COLONOSCOPY N/A 01/16/2021   Procedure: COLONOSCOPY;  Surgeon: Doran Stabler, MD;  [facility]: [facility];  Service: Gastroenterology;  Laterality: N/A;   COLONOSCOPY WITH PROPOFOL N/A 01/10/2021   Procedure: COLONOSCOPY WITH PROPOFOL;  Surgeon: Irene Shipper, MD;  [facility]: [facility];  Service: Endoscopy;  Laterality: N/A;   COLOSTOMY N/A 01/15/2021   Procedure: COLOSTOMY;  Surgeon: Erroll Luna, MD;  Location: [facility];  Service: General;  Laterality: N/A;   ESOPHAGOGASTRODUODENOSCOPY (EGD) WITH PROPOFOL N/A 01/14/2021   Procedure: ESOPHAGOGASTRODUODENOSCOPY (EGD) WITH PROPOFOL;  Surgeon: Doran Stabler, MD;  [facility];  Service: Endoscopy;  Laterality: N/A;   FLEXIBLE SIGMOIDOSCOPY N/A 01/14/2021   Procedure: FLEXIBLE SIGMOIDOSCOPY;  Surgeon: Doran Stabler, MD;  [facility];  Service: Endoscopy;  Laterality: N/A;   HOT HEMOSTASIS N/A 01/10/2021   Procedure: HOT HEMOSTASIS (ARGON PLASMA COAGULATION/BICAP);  Surgeon: Irene Shipper, MD;  Location: [facility];  Service: Endoscopy;  Laterality: N/A;   IR ANGIOGRAM SELECTIVE EACH ADDITIONAL VESSEL  01/12/2021   IR ANGIOGRAM SELECTIVE EACH ADDITIONAL VESSEL  01/12/2021   IR ANGIOGRAM SELECTIVE EACH ADDITIONAL VESSEL  01/12/2021   IR ANGIOGRAM VISCERAL SELECTIVE  01/09/2021   IR ANGIOGRAM VISCERAL SELECTIVE  01/12/2021   IR EMBO ARTERIAL NOT  HEMORR HEMANG INC GUIDE ROADMAPPING  01/12/2021   IR US GUIDE VASC ACCESS RIGHT  01/09/2021   IR US GUIDE VASC ACCESS RIGHT  01/12/2021   PARTIAL COLECTOMY N/A 01/15/2021   Procedure: PARTIAL COLECTOMY;  Surgeon: Erroll Luna, MD;  Location: [facility];  Service: General;  Laterality: N/A;   POLYPECTOMY  01/10/2021   Procedure: POLYPECTOMY;  Surgeon: Irene Shipper, MD;  [facility];  Service: Endoscopy;;   TONSILLECTOMY      Social History: Social History   Socioeconomic History   Marital status: Widowed    Spouse name: Not on file   Number of children: 1   Years of education: Not on file   Highest education level: Not on file  Occupational History   Occupation: retired    Fish farm manager: RETIRED  Tobacco Use   Smoking status: Every Day    Packs/day: 1.00    Years: 59.00    Total pack years: 59.00    Types: Cigarettes   Smokeless tobacco: Never   Tobacco comments:    tobacco  info given 06/09/18  Vaping Use   Vaping Use: Never used  Substance and Sexual Activity   Alcohol use: No   Drug use: No   Sexual activity: Never  Other Topics Concern   Not on file  Social History Narrative   Not on file   Social Determinants of Health   Financial Resource Strain: Not on file  Food Insecurity: No Food Insecurity (01/03/2023)   Hunger Vital Sign    Worried About Running Out of Food in the Last Year: Never true    Ran Out of Food in the Last Year: Never true  Transportation Needs: No Transportation Needs (01/03/2023)   PRAPARE - Hydrologist (Medical): No    Lack of Transportation (Non-Medical): No  Physical Activity: Not on file  Stress: Not on file  Social Connections: Not on file  Intimate Partner Violence: Not At Risk (01/03/2023)   Humiliation, Afraid, Rape, and Kick questionnaire    Fear of Current or Ex-Partner: No    Emotionally Abused: No    Physically Abused: No    Sexually Abused: No    Family History: Family History  Problem  Relation Age of Onset   Breast cancer Mother    Lung cancer Father    Breast cancer Sister    Anuerysm Brother        in stomach   Colon cancer Neg Hx    Esophageal cancer Neg Hx    Rectal cancer Neg Hx     Current Medications:  Current Outpatient Medications:    acetaminophen (TYLENOL) 500 MG tablet, Take 2 tablets (1,000 mg total) by mouth every 6 (six) hours as needed. (Patient taking differently: Take 1,000 mg by mouth every 6 (six) hours as needed for mild pain.), Disp: 30 tablet, Rfl: 0   amLODipine (NORVASC) 5 MG tablet, Take 5 mg by mouth daily., Disp: , Rfl:    aspirin EC 81 MG tablet, Take 81 mg by mouth daily. Swallow whole., Disp: , Rfl:    calcium carbonate (OS-CAL) 600 MG TABS, Take 600 mg by mouth daily., Disp: , Rfl:    ECHINACEA PO, Take 1 capsule by mouth daily., Disp: , Rfl:    ferrous sulfate 325 (65 FE) MG EC tablet, Take 325 mg by mouth 2 (two) times daily. , Disp: , Rfl:    furosemide (LASIX) 20 MG tablet, Take 1 tablet (20 mg total) by mouth daily as needed for fluid or edema. Take for weight gain of 3lbs or more in 24 hours or 5lbs or more over 7 days. Also for swelling or shortness of breath., Disp: 30 tablet, Rfl: 2   gabapentin (NEURONTIN) 300 MG capsule, Take 1 capsule (300 mg total) by mouth 4 (four) times daily., Disp: 120 capsule, Rfl: 3   Ipratropium-Albuterol (COMBIVENT RESPIMAT) 20-100 MCG/ACT AERS respimat, Inhale 1 puff into the lungs every 6 (six) hours as needed for wheezing or shortness of breath., Disp: 4 g, Rfl: 2   lidocaine (LIDODERM) 5 %, Place 1 patch onto the skin daily. Remove & Discard patch within 12 hours or as directed by MD, Disp: 30 patch, Rfl: 0   losartan (COZAAR) 50 MG tablet, Take 50 mg by mouth daily., Disp: , Rfl:    metoprolol tartrate (LOPRESSOR) 25 MG tablet, Take 0.5 tablets (12.5 mg total) by mouth 2 (two) times daily., Disp: 60 tablet, Rfl: 1   Omega-3 Fatty Acids (FISH OIL PO), Take 1 tablet by mouth daily., Disp: , Rfl:  pravastatin (PRAVACHOL) 10 MG tablet, Take 10 mg by mouth daily., Disp: , Rfl:    Allergies: No Known Allergies  REVIEW OF SYSTEMS:   Review of Systems  Constitutional:  Negative for chills, fatigue and fever.  HENT:   Negative for lump/mass, mouth sores, nosebleeds, sore throat and trouble swallowing.   Eyes:  Negative for eye problems.  Respiratory:  Negative for cough and shortness of breath.   Cardiovascular:  Negative for chest pain, leg swelling and palpitations.  Gastrointestinal:  Negative for abdominal pain, constipation, diarrhea, nausea and vomiting.  Genitourinary:  Negative for bladder incontinence, difficulty urinating, dysuria, frequency, hematuria and nocturia.   Musculoskeletal:  Negative for arthralgias, back pain, flank pain, myalgias and neck pain.  Skin:  Negative for itching and rash.  Neurological:  Negative for dizziness, headaches and numbness.  Hematological:  Does not bruise/bleed easily.  Psychiatric/Behavioral:  Negative for depression, sleep disturbance and suicidal ideas. The patient is not nervous/anxious.   All other systems reviewed and are negative.    VITALS:   Blood pressure (!) 158/85, pulse 75, temperature 97.6 F (36.4 C), temperature source Oral, resp. rate 18, weight 94 lb 3.2 oz (42.7 kg), SpO2 99 %.  Wt Readings from Last 3 Encounters:  01/17/23 94 lb 3.2 oz (42.7 kg)  01/03/23 92 lb 4.8 oz (41.9 kg)  12/28/22 91 lb (41.3 kg)    Body mass index is 17.51 kg/m.  Performance status (ECOG): 1 - Symptomatic but completely ambulatory  PHYSICAL EXAM:   Physical Exam Vitals and nursing note reviewed. Exam conducted with a chaperone present.  Constitutional:      Appearance: Normal appearance.  Cardiovascular:     Rate and Rhythm: Normal rate and regular rhythm.     Pulses: Normal pulses.     Heart sounds: Normal heart sounds.  Pulmonary:     Effort: Pulmonary effort is normal.     Breath sounds: Normal breath sounds.  Abdominal:      Palpations: Abdomen is soft. There is no hepatomegaly, splenomegaly or mass.     Tenderness: There is no abdominal tenderness.  Musculoskeletal:     Right lower leg: No edema.     Left lower leg: No edema.  Lymphadenopathy:     Cervical: No cervical adenopathy.     Right cervical: No superficial, deep or posterior cervical adenopathy.    Left cervical: No superficial, deep or posterior cervical adenopathy.     Upper Body:     Right upper body: No supraclavicular or axillary adenopathy.     Left upper body: No supraclavicular or axillary adenopathy.  Neurological:     General: No focal deficit present.     Mental Status: She is alert and oriented to person, place, and time.  Psychiatric:        Mood and Affect: Mood normal.        Behavior: Behavior normal.     LABS:      Latest Ref Rng & Units 05/13/2022    3:52 AM 05/12/2022   12:27 PM 05/12/2022    8:15 AM  CBC  WBC 4.0 - 10.5 K/uL 12.4  10.7  10.8   Hemoglobin 12.0 - 15.0 g/dL 11.4  13.6  13.0   Hematocrit 36.0 - 46.0 % 33.6  40.1  37.1   Platelets 150 - 400 K/uL 376  439  416       Latest Ref Rng & Units 05/13/2022    3:52 AM 05/12/2022   12:27 PM  05/12/2022    8:15 AM  CMP  Glucose 70 - 99 mg/dL 89   112   BUN 8 - 23 mg/dL 26   13   Creatinine 0.44 - 1.00 mg/dL 0.91  0.73  0.73   Sodium 135 - 145 mmol/L 137   133   Potassium 3.5 - 5.1 mmol/L 4.3   3.9   Chloride 98 - 111 mmol/L 105   99   CO2 22 - 32 mmol/L 25   25   Calcium 8.9 - 10.3 mg/dL 9.2   9.5   Total Protein 6.5 - 8.1 g/dL   7.4   Total Bilirubin 0.3 - 1.2 mg/dL   0.8   Alkaline Phos 38 - 126 U/L   62   AST 15 - 41 U/L   21   ALT 0 - 44 U/L   13      No results found for: "CEA1", "CEA" / No results found for: "CEA1", "CEA" No results found for: "PSA1" No results found for: "[id number]" No results found for: "CAN125"  No results found for: "TOTALPROTELP", "ALBUMINELP", "A1GS", "A2GS", "BETS", "BETA2SER", "GAMS", "MSPIKE", "SPEI" No results found for:  "TIBC", "FERRITIN", "IRONPCTSAT" No results found for: "LDH"   STUDIES:   MR Brain W Wo Contrast  Result Date: 01/15/2023 CLINICAL DATA:  Non-small cell lung cancer staging EXAM: MRI HEAD WITHOUT AND WITH CONTRAST TECHNIQUE: Multiplanar, multiecho pulse sequences of the brain and surrounding structures were obtained without and with intravenous contrast. CONTRAST:  72m GADAVIST GADOBUTROL 1 MMOL/ML IV SOLN COMPARISON:  PET CT 01/06/2023 FINDINGS: Brain: No acute infarction, hemorrhage, hydrocephalus, or mass lesion. Hygroma characteristics lateral to the left cerebellum measuring up to 2.5 cm. Moderate chronic small vessel ischemia in the cerebral white matter. No abnormal enhancement. Vascular: Major flow voids and vascular enhancements are preserved Skull and upper cervical spine: Normal marrow Sinuses/Orbits: Negative. IMPRESSION: Negative for metastatic disease. Electronically Signed   By: JJorje GuildM.D.   On: 01/15/2023 11:30   NM PET Image Initial (PI) Skull Base To Thigh  Result Date: 01/07/2023 CLINICAL DATA:  Initial treatment strategy for non-small cell lung cancer. EXAM: NUCLEAR MEDICINE PET SKULL BASE TO THIGH TECHNIQUE: 5.7 mCi F-18 FDG was injected intravenously. Full-ring PET imaging was performed from the skull base to thigh after the radiotracer. CT data was obtained and used for attenuation correction and anatomic localization. Fasting blood glucose: 99 mg/dl COMPARISON:  CT chest 12/28/2022. FINDINGS: Mediastinal blood pool activity: SUV max 2.2 Liver activity: SUV max NA NECK: Hypermetabolic 6 mm right parotid nodule, SUV max 2.9. No hypermetabolic lymph nodes. Incidental CT findings: None. CHEST: Masslike consolidation and associated ground-glass in the lateral segment right middle lobe, collectively measure 2.2 x 4.5 cm (7/57), SUV max 13.4. No hypermetabolic lymph nodes. Incidental CT findings: Atherosclerotic calcification of the aorta, aortic valve and coronary arteries.  Heart is enlarged. No pericardial effusion. Trace left pleural fluid. Centrilobular emphysema. Vague 8 mm nodule in the superior segment right lower lobe (7/34), too small for PET resolution. ABDOMEN/PELVIS: Mild hypermetabolism in the left adrenal gland, associated with an adenoma. Otherwise, no abnormal hypermetabolism in the liver, adrenal glands, spleen or pancreas. No hypermetabolic lymph nodes. Incidental CT findings: Liver, gallbladder and right adrenal gland are unremarkable. Left adrenal adenoma measures 2.0 cm. Ptotic right kidney. Kidneys, spleen, pancreas, stomach and bowel are otherwise unremarkable. SKELETON: Uptake associated with new lower left rib fractures. No abnormal hypermetabolism. Incidental CT findings: Bilateral L5 pars defects. Degenerative changes in  the spine. IMPRESSION: 1. Hypermetabolic subsolid mass in the right middle lobe, compatible with primary bronchogenic carcinoma (T2b N0 M0, stage II A disease). Hypermetabolic 6 mm right parotid nodule. Malignancy cannot be excluded. 2. Vague 8 mm nodule in the right lower lobe, too small for PET resolution. Recommend continued attention on follow-up. 3. Trace left pleural fluid. 4. Left adrenal adenoma. 5. Aortic atherosclerosis (ICD10-I70.0). Coronary artery calcification. 6.  Emphysema (ICD10-J43.9). Electronically Signed   By: Lorin Picket M.D.   On: 01/07/2023 13:40   CT Chest Wo Contrast  Result Date: 12/28/2022 CLINICAL DATA:  Fall today.  Left anterior chest wall pain. EXAM: CT CHEST WITHOUT CONTRAST TECHNIQUE: Multidetector CT imaging of the chest was performed following the standard protocol without IV contrast. RADIATION DOSE REDUCTION: This exam was performed according to the departmental dose-optimization program which includes automated exposure control, adjustment of the mA and/or kV according to patient size and/or use of iterative reconstruction technique. COMPARISON:  Chest radiographs 05/12/2022 and 01/17/2021.  Abdominal CT 01/11/2021. No prior chest CT. FINDINGS: Cardiovascular: Diffuse atherosclerosis of the aorta, great vessels and coronary arteries. There are calcifications of the aortic valve and mitral annulus. The heart size is normal. There is no pericardial effusion. Mediastinum/Nodes: There are no enlarged mediastinal, hilar or axillary lymph nodes.Hilar assessment is limited by the lack of intravenous contrast. The thyroid gland, trachea and esophagus demonstrate no significant findings. Lungs/Pleura: No pleural effusion or pneumothorax. The pleural effusions seen on prior chest radiographs have resolved. There is underlying moderate centrilobular and paraseptal emphysema. A spiculated mass anteriorly in the right lung measures 2.6 x 1.8 cm on image 96/4 and is highly suspicious for bronchogenic carcinoma. This appears to cross the minor fissure and involve both the upper and middle lobes. No other highly suspicious pulmonary nodularity is identified. There is ill-defined nodularity in the superior segment of the right lower lobe measuring up to 1.1 x 0.4 cm on image 68/4. Upper abdomen: No acute findings are seen in the visualized upper abdomen. The adrenal glands appear unchanged from previous abdominal CT. Musculoskeletal/Chest wall: No acute rib fractures are identified. There is an old fracture of the left 6th rib laterally. No evidence of chest wall mass or osseous metastatic disease. There is a thoracolumbar scoliosis with upper lumbar spondylosis. IMPRESSION: 1. No acute posttraumatic findings identified in the chest. 2. Spiculated mass anteriorly in the right lung is highly suspicious for bronchogenic carcinoma. This appears to cross the minor fissure and involve both the upper and middle lobes. Recommend referral to multi disciplinary thoracic oncology service. PET-CT may be helpful for further staging. 3. Ill-defined nodularity in the superior segment of the right lower lobe is nonspecific,  potentially inflammatory. 4. Old left-sided rib fracture. No acute osseous findings identified. 5. Aortic Atherosclerosis (ICD10-I70.0) and Emphysema (ICD10-J43.9). Electronically Signed   By: Richardean Sale M.D.   On: 12/28/2022 11:22

## 2023-01-17 ENCOUNTER — Inpatient Hospital Stay: Payer: Medicare Other | Attending: Hematology | Admitting: Hematology

## 2023-01-17 ENCOUNTER — Encounter: Payer: Self-pay | Admitting: Hematology

## 2023-01-17 VITALS — BP 158/85 | HR 75 | Temp 97.6°F | Resp 18 | Wt 94.2 lb

## 2023-01-17 DIAGNOSIS — I10 Essential (primary) hypertension: Secondary | ICD-10-CM | POA: Insufficient documentation

## 2023-01-17 DIAGNOSIS — J449 Chronic obstructive pulmonary disease, unspecified: Secondary | ICD-10-CM | POA: Insufficient documentation

## 2023-01-17 DIAGNOSIS — F17210 Nicotine dependence, cigarettes, uncomplicated: Secondary | ICD-10-CM | POA: Diagnosis not present

## 2023-01-17 DIAGNOSIS — Z79899 Other long term (current) drug therapy: Secondary | ICD-10-CM | POA: Diagnosis not present

## 2023-01-17 DIAGNOSIS — E7800 Pure hypercholesterolemia, unspecified: Secondary | ICD-10-CM | POA: Diagnosis not present

## 2023-01-17 DIAGNOSIS — Z7982 Long term (current) use of aspirin: Secondary | ICD-10-CM | POA: Diagnosis not present

## 2023-01-17 DIAGNOSIS — C3491 Malignant neoplasm of unspecified part of right bronchus or lung: Secondary | ICD-10-CM | POA: Diagnosis not present

## 2023-01-17 NOTE — Patient Instructions (Addendum)
[facility]  Discharge Instructions  You were seen and examined today by Dr. Delton Coombes.  Dr. Delton Coombes has reviewed your recent PET scan and MRI.   There is no cancer in your brain. There is cancer in one lung. There is no spread of the cancer elsewhere.  Dr. Delton Coombes will refer you to a lung specialist at [facility] to arrange for biopsy.  Follow-up with Dr. Delton Coombes one week after biopsy.  Thank you for choosing [facility] to provide your oncology and hematology care.   To afford each patient quality time with our provider, please arrive at least 15 minutes before your scheduled appointment time. You may need to reschedule your appointment if you arrive late (10 or more minutes). Arriving late affects you and other patients whose appointments are after yours.  Also, if you miss three or more appointments without notifying the office, you may be dismissed from the clinic at the provider's discretion.    Again, thank you for choosing [facility].  Our hope is that these requests will decrease the amount of time that you wait before being seen by our physicians.   If you have a lab appointment with the [facility] please come in thru the Main Entrance and check in at the main information desk.           _____________________________________________________________  Should you have questions after your visit to [facility], please contact our office at [phone] and follow the prompts.  Our office hours are 8:00 a.m. to 4:30 p.m. Monday - Thursday and 8:00 a.m. to 2:30 p.m. Friday.  Please note that voicemails left after 4:00 p.m. may not be returned until the following business day.  We are closed weekends and all major holidays.  You do have access to a nurse 24-7, just call the main number to the clinic [phone] and do not press any options, hold on the line and a nurse will answer the phone.     For prescription refill requests, have your pharmacy contact our office and allow 72 hours.    Masks are optional in the cancer centers. If you would like for your care team to wear a mask while they are taking care of you, please let them know. You may have one support person who is at least 87 years old accompany you for your appointments.

## 2023-01-27 DIAGNOSIS — Z85828 Personal history of other malignant neoplasm of skin: Secondary | ICD-10-CM | POA: Diagnosis not present

## 2023-01-27 DIAGNOSIS — C44722 Squamous cell carcinoma of skin of right lower limb, including hip: Secondary | ICD-10-CM | POA: Diagnosis not present

## 2023-01-27 DIAGNOSIS — Z08 Encounter for follow-up examination after completed treatment for malignant neoplasm: Secondary | ICD-10-CM | POA: Diagnosis not present

## 2023-02-02 DIAGNOSIS — L570 Actinic keratosis: Secondary | ICD-10-CM | POA: Diagnosis not present

## 2023-02-02 DIAGNOSIS — X32XXXD Exposure to sunlight, subsequent encounter: Secondary | ICD-10-CM | POA: Diagnosis not present

## 2023-02-02 DIAGNOSIS — Z08 Encounter for follow-up examination after completed treatment for malignant neoplasm: Secondary | ICD-10-CM | POA: Diagnosis not present

## 2023-02-02 DIAGNOSIS — Z85828 Personal history of other malignant neoplasm of skin: Secondary | ICD-10-CM | POA: Diagnosis not present

## 2023-02-03 ENCOUNTER — Encounter (HOSPITAL_BASED_OUTPATIENT_CLINIC_OR_DEPARTMENT_OTHER): Payer: Self-pay | Admitting: Pulmonary Disease

## 2023-02-03 ENCOUNTER — Ambulatory Visit (HOSPITAL_BASED_OUTPATIENT_CLINIC_OR_DEPARTMENT_OTHER): Payer: Medicare Other | Admitting: Pulmonary Disease

## 2023-02-03 VITALS — BP 128/60 | HR 70 | Ht 61.5 in | Wt 90.4 lb

## 2023-02-03 DIAGNOSIS — R918 Other nonspecific abnormal finding of lung field: Secondary | ICD-10-CM

## 2023-02-03 DIAGNOSIS — J449 Chronic obstructive pulmonary disease, unspecified: Secondary | ICD-10-CM | POA: Diagnosis not present

## 2023-02-03 MED ORDER — FLUTICASONE-SALMETEROL 100-50 MCG/ACT IN AEPB: 1.0000 | INHALATION_SPRAY | Freq: Two times a day (BID) | RESPIRATORY_TRACT | 5 refills | Status: DC

## 2023-02-03 NOTE — Progress Notes (Unsigned)
Subjective:   PATIENT ID: Kathleen Pope GENDER: female DOB: June 19, 1933, MRN: [id number]  Chief Complaint  Patient presents with   Consult    Needs biopsy on lungs    Reason for Visit: New patient  Ms. Kathleen Pope is a 89 year old female active smoker with COPD, mild aortic stenosis, HTN, HLD who presents  Previously seen by Dr. Sherene Sires on 11/27/20 for new right pleural effusion.  She recently had squamous cell carcinoma of the skin biopsied from the right anterior thigh  Denies history of cancer.   Ambulates at baseline with cane. Denies shortness. Has chronic productive daily - smoker's cough. Occasional wheezing.   Daughter (61 years) passed away from MI.   Social History: Active smoker. 2/3 ppd x 70 years.   I have personally reviewed patient's past medical/family/social history, allergies, current medications.  Past Medical History:  Diagnosis Date   Anemia    pt denies, yet takes iron   Chronic airway obstruction, not elsewhere classified    COPD (chronic obstructive pulmonary disease) (HCC)    Diverticulosis    GI hemorrhage from post-treatment cecal ulcer 08/18/2012   Hemorrhoids    HTN (hypertension)    Hypercholesteremia    Lower GI bleed multiple   AVM's     Family History  Problem Relation Age of Onset   Breast cancer Mother    Lung cancer Father    Breast cancer Sister    Anuerysm Brother        in stomach   Colon cancer Neg Hx    Esophageal cancer Neg Hx    Rectal cancer Neg Hx      Social History   Occupational History   Occupation: retired    Associate Professor: RETIRED  Tobacco Use   Smoking status: Every Day    Packs/day: 1.00    Years: 59.00    Additional pack years: 0.00    Total pack years: 59.00    Types: Cigarettes   Smokeless tobacco: Never   Tobacco comments:    Started smoking at age 24  Vaping Use   Vaping Use: Never used  Substance and Sexual Activity   Alcohol use: No   Drug use: No   Sexual activity: Never    No Known  Allergies   Outpatient Medications Prior to Visit  Medication Sig Dispense Refill   amLODipine (NORVASC) 5 MG tablet Take 5 mg by mouth daily.     ECHINACEA PO Take 1 capsule by mouth daily.     ferrous sulfate 325 (65 FE) MG EC tablet Take 325 mg by mouth 2 (two) times daily.      metoprolol tartrate (LOPRESSOR) 25 MG tablet Take 0.5 tablets (12.5 mg total) by mouth 2 (two) times daily. 60 tablet 1   Omega-3 Fatty Acids (FISH OIL PO) Take 1 tablet by mouth daily.     pravastatin (PRAVACHOL) 10 MG tablet Take 10 mg by mouth daily.     acetaminophen (TYLENOL) 500 MG tablet Take 2 tablets (1,000 mg total) by mouth every 6 (six) hours as needed. (Patient not taking: Reported on 02/03/2023) 30 tablet 0   aspirin EC 81 MG tablet Take 81 mg by mouth daily. Swallow whole. (Patient not taking: Reported on 02/03/2023)     calcium carbonate (OS-CAL) 600 MG TABS Take 600 mg by mouth daily.     furosemide (LASIX) 20 MG tablet Take 1 tablet (20 mg total) by mouth daily as needed for fluid or edema. Take for  weight gain of 3lbs or more in 24 hours or 5lbs or more over 7 days. Also for swelling or shortness of breath. 30 tablet 2   gabapentin (NEURONTIN) 300 MG capsule Take 1 capsule (300 mg total) by mouth 4 (four) times daily. (Patient not taking: Reported on 02/03/2023) 120 capsule 3   Ipratropium-Albuterol (COMBIVENT RESPIMAT) 20-100 MCG/ACT AERS respimat Inhale 1 puff into the lungs every 6 (six) hours as needed for wheezing or shortness of breath. (Patient not taking: Reported on 02/03/2023) 4 g 2   lidocaine (LIDODERM) 5 % Place 1 patch onto the skin daily. Remove & Discard patch within 12 hours or as directed by MD (Patient not taking: Reported on 02/03/2023) 30 patch 0   losartan (COZAAR) 50 MG tablet Take 50 mg by mouth daily. (Patient not taking: Reported on 02/03/2023)     No facility-administered medications prior to visit.    ROS   Objective:   Vitals:   02/03/23 1006  BP: 128/60  Pulse: 70   SpO2: 98%  Weight: 90 lb 6.4 oz (41 kg)  Height: 5' 1.5" (1.562 m)   SpO2: 98 % O2 Device: None (Room air)  Physical Exam: General: Well-appearing, no acute distress HENT: [place], AT Eyes: EOMI, no scleral icterus Respiratory: ***Clear to auscultation bilaterally.  No crackles, wheezing or rales Cardiovascular: RRR, -M/R/G, no JVD Extremities:-Edema,-tenderness Neuro: AAO x4, CNII-XII grossly intact Psych: Normal mood, normal affect  Data Reviewed:  Imaging: CT Chest 12/28/22 - spiculated mass in th right lung measuring 2.6 x 1.8 cm. RLL nodule measuring 1.1 x 0.4 cm. PET/CT 01/06/23 - Hypermetabolic subsolid mass in the RML  PFT: None on file  Labs: CBC    Component Value Date/Time   WBC 12.4 (H) 05/13/2022 0352   RBC 3.42 (L) 05/13/2022 0352   HGB 11.4 (L) 05/13/2022 0352   HCT 33.6 (L) 05/13/2022 0352   PLT 376 05/13/2022 0352   MCV 98.2 05/13/2022 0352   MCH 33.3 05/13/2022 0352   MCHC 33.9 05/13/2022 0352   RDW 13.9 05/13/2022 0352   LYMPHSABS 1.5 05/12/2022 0815   MONOABS 1.0 05/12/2022 0815   EOSABS 0.5 05/12/2022 0815   BASOSABS 0.0 05/12/2022 0815     Assessment & Plan:   Discussion: HPI  ECOG 0-1  Right lung masses --Will discuss at tumor board regarding bronchoscopy  COPD --START Advair 100-50 mcg ONE puff in the morning and evening. Rinse mouth out after use --ORDER pulmonary function tests  Health Maintenance Immunization History  Administered Date(s) Administered   Fluad Quad(high Dose 65+) 10/22/2020   Influenza Split 08/10/2012   Influenza-Unspecified 08/23/2022   Moderna Sars-Covid-2 Vaccination 12/06/2019, 01/11/2020, 10/22/2020   CT Lung Screen - not eligible  Orders Placed This Encounter  Procedures   Pulmonary function test    Standing Status:   Future    Standing Expiration Date:   02/03/2024    Order Specific Question:   Where should this test be performed?    Answer:   [facility]    Order Specific Question:   Full  PFT: includes the following: basic spirometry, spirometry pre & post bronchodilator, diffusion capacity (DLCO), lung volumes    Answer:   Full PFT   Meds ordered this encounter  Medications   fluticasone-salmeterol (ADVAIR) 100-50 MCG/ACT AEPB    Sig: Inhale 1 puff into the lungs 2 (two) times daily.    Dispense:  60 each    Refill:  5    Return in about 1 month (  around 03/06/2023) for with Dr. Everardo All, after PFT.  I have spent a total time of 60-minutes on the day of the appointment reviewing prior documentation, coordinating care and discussing medical diagnosis and plan with the patient/family. Imaging, labs and tests included in this note have been reviewed and interpreted independently by me.  Lavin Petteway Mechele Collin, MD [facility] 02/03/2023 10:36 AM  Office Number [phone]

## 2023-02-03 NOTE — Patient Instructions (Signed)
Right lung masses --Will discuss at tumor board regarding bronchoscopy  COPD --START Advair 100-50 mcg ONE puff in the morning and evening. Rinse mouth out after use --ORDER pulmonary function tests

## 2023-02-03 NOTE — H&P (View-Only) (Signed)
  Subjective:   PATIENT ID: Kathleen Pope GENDER: female DOB: 07/26/1933, MRN: [id number]  Chief Complaint  Patient presents with   Consult    Needs biopsy on lungs    Reason for Visit: New patient  Ms. Kathleen Pope is a 89 year old female active smoker with COPD, mild aortic stenosis, HTN, HLD who presents  Previously seen by Dr. Wert on 11/27/20 for new right pleural effusion.  She recently had squamous cell carcinoma of the skin biopsied from the right anterior thigh  Denies history of cancer.   Ambulates at baseline with cane. Denies shortness. Has chronic productive daily - smoker's cough. Occasional wheezing.   Daughter (61 years) passed away from MI.   Social History: Active smoker. 2/3 ppd x 70 years.   I have personally reviewed patient's past medical/family/social history, allergies, current medications.  Past Medical History:  Diagnosis Date   Anemia    pt denies, yet takes iron   Chronic airway obstruction, not elsewhere classified    COPD (chronic obstructive pulmonary disease) (HCC)    Diverticulosis    GI hemorrhage from post-treatment cecal ulcer 08/18/2012   Hemorrhoids    HTN (hypertension)    Hypercholesteremia    Lower GI bleed multiple   AVM's     Family History  Problem Relation Age of Onset   Breast cancer Mother    Lung cancer Father    Breast cancer Sister    Anuerysm Brother        in stomach   Colon cancer Neg Hx    Esophageal cancer Neg Hx    Rectal cancer Neg Hx      Social History   Occupational History   Occupation: retired    Employer: RETIRED  Tobacco Use   Smoking status: Every Day    Packs/day: 1.00    Years: 59.00    Additional pack years: 0.00    Total pack years: 59.00    Types: Cigarettes   Smokeless tobacco: Never   Tobacco comments:    Started smoking at age 20  Vaping Use   Vaping Use: Never used  Substance and Sexual Activity   Alcohol use: No   Drug use: No   Sexual activity: Never    No Known  Allergies   Outpatient Medications Prior to Visit  Medication Sig Dispense Refill   amLODipine (NORVASC) 5 MG tablet Take 5 mg by mouth daily.     ECHINACEA PO Take 1 capsule by mouth daily.     ferrous sulfate 325 (65 FE) MG EC tablet Take 325 mg by mouth 2 (two) times daily.      metoprolol tartrate (LOPRESSOR) 25 MG tablet Take 0.5 tablets (12.5 mg total) by mouth 2 (two) times daily. 60 tablet 1   Omega-3 Fatty Acids (FISH OIL PO) Take 1 tablet by mouth daily.     pravastatin (PRAVACHOL) 10 MG tablet Take 10 mg by mouth daily.     acetaminophen (TYLENOL) 500 MG tablet Take 2 tablets (1,000 mg total) by mouth every 6 (six) hours as needed. (Patient not taking: Reported on 02/03/2023) 30 tablet 0   aspirin EC 81 MG tablet Take 81 mg by mouth daily. Swallow whole. (Patient not taking: Reported on 02/03/2023)     calcium carbonate (OS-CAL) 600 MG TABS Take 600 mg by mouth daily.     furosemide (LASIX) 20 MG tablet Take 1 tablet (20 mg total) by mouth daily as needed for fluid or edema. Take for   weight gain of 3lbs or more in 24 hours or 5lbs or more over 7 days. Also for swelling or shortness of breath. 30 tablet 2   gabapentin (NEURONTIN) 300 MG capsule Take 1 capsule (300 mg total) by mouth 4 (four) times daily. (Patient not taking: Reported on 02/03/2023) 120 capsule 3   Ipratropium-Albuterol (COMBIVENT RESPIMAT) 20-100 MCG/ACT AERS respimat Inhale 1 puff into the lungs every 6 (six) hours as needed for wheezing or shortness of breath. (Patient not taking: Reported on 02/03/2023) 4 g 2   lidocaine (LIDODERM) 5 % Place 1 patch onto the skin daily. Remove & Discard patch within 12 hours or as directed by MD (Patient not taking: Reported on 02/03/2023) 30 patch 0   losartan (COZAAR) 50 MG tablet Take 50 mg by mouth daily. (Patient not taking: Reported on 02/03/2023)     No facility-administered medications prior to visit.    ROS   Objective:   Vitals:   02/03/23 1006  BP: 128/60  Pulse: 70   SpO2: 98%  Weight: 90 lb 6.4 oz (41 kg)  Height: 5' 1.5" (1.562 m)   SpO2: 98 % O2 Device: None (Room air)  Physical Exam: General: Well-appearing, no acute distress HENT: [place], AT Eyes: EOMI, no scleral icterus Respiratory: ***Clear to auscultation bilaterally.  No crackles, wheezing or rales Cardiovascular: RRR, -M/R/G, no JVD Extremities:-Edema,-tenderness Neuro: AAO x4, CNII-XII grossly intact Psych: Normal mood, normal affect  Data Reviewed:  Imaging: CT Chest 12/28/22 - spiculated mass in th right lung measuring 2.6 x 1.8 cm. RLL nodule measuring 1.1 x 0.4 cm. PET/CT 01/06/23 - Hypermetabolic subsolid mass in the RML  PFT: None on file  Labs: CBC    Component Value Date/Time   WBC 12.4 (H) 05/13/2022 0352   RBC 3.42 (L) 05/13/2022 0352   HGB 11.4 (L) 05/13/2022 0352   HCT 33.6 (L) 05/13/2022 0352   PLT 376 05/13/2022 0352   MCV 98.2 05/13/2022 0352   MCH 33.3 05/13/2022 0352   MCHC 33.9 05/13/2022 0352   RDW 13.9 05/13/2022 0352   LYMPHSABS 1.5 05/12/2022 0815   MONOABS 1.0 05/12/2022 0815   EOSABS 0.5 05/12/2022 0815   BASOSABS 0.0 05/12/2022 0815     Assessment & Plan:   Discussion: HPI  ECOG 0-1  Right lung masses --Will discuss at tumor board regarding bronchoscopy  COPD --START Advair 100-50 mcg ONE puff in the morning and evening. Rinse mouth out after use --ORDER pulmonary function tests  Health Maintenance Immunization History  Administered Date(s) Administered   Fluad Quad(high Dose 65+) 10/22/2020   Influenza Split 08/10/2012   Influenza-Unspecified 08/23/2022   Moderna Sars-Covid-2 Vaccination 12/06/2019, 01/11/2020, 10/22/2020   CT Lung Screen - not eligible  Orders Placed This Encounter  Procedures   Pulmonary function test    Standing Status:   Future    Standing Expiration Date:   02/03/2024    Order Specific Question:   Where should this test be performed?    Answer:   [facility]    Order Specific Question:   Full  PFT: includes the following: basic spirometry, spirometry pre & post bronchodilator, diffusion capacity (DLCO), lung volumes    Answer:   Full PFT   Meds ordered this encounter  Medications   fluticasone-salmeterol (ADVAIR) 100-50 MCG/ACT AEPB    Sig: Inhale 1 puff into the lungs 2 (two) times daily.    Dispense:  60 each    Refill:  5    Return in about 1 month (  around 03/06/2023) for with Dr. Arek Spadafore, after PFT.  I have spent a total time of 60-minutes on the day of the appointment reviewing prior documentation, coordinating care and discussing medical diagnosis and plan with the patient/family. Imaging, labs and tests included in this note have been reviewed and interpreted independently by me.  Sonakshi Rolland Jane Mostafa Yuan, MD [facility] 02/03/2023 10:36 AM  Office Number [phone]   

## 2023-02-05 ENCOUNTER — Encounter (HOSPITAL_BASED_OUTPATIENT_CLINIC_OR_DEPARTMENT_OTHER): Payer: Self-pay | Admitting: Pulmonary Disease

## 2023-02-05 ENCOUNTER — Telehealth (HOSPITAL_BASED_OUTPATIENT_CLINIC_OR_DEPARTMENT_OTHER): Payer: Self-pay | Admitting: Pulmonary Disease

## 2023-02-05 DIAGNOSIS — R918 Other nonspecific abnormal finding of lung field: Secondary | ICD-10-CM | POA: Insufficient documentation

## 2023-02-05 HISTORY — DX: Other nonspecific abnormal finding of lung field: R91.8

## 2023-02-05 NOTE — Telephone Encounter (Signed)
Referring for navigational bronchoscopy. Please review patient chart and images. Briefly 89 year old female active smoker with BMI 16 with moderate emphysema, moderate aortic stenosis, HTN, HLD with PET avid RUL/RML lung mass measuring 2.6 x 1.8 cm and RLL nodule 1.1 x 0.4 cm. Oncology requesting biopsy with plan for SBRT. She has an ECOG of 1, ambulates with cane.

## 2023-02-05 NOTE — Telephone Encounter (Signed)
PCCM:  Orders placed for RB on 02/14/2023 This is next available slot on schedule  Garner Nash, DO [facility] 02/05/2023 10:28 AM

## 2023-02-08 ENCOUNTER — Encounter: Payer: Self-pay | Admitting: Emergency Medicine

## 2023-02-09 NOTE — Progress Notes (Signed)
SDW call    PCP - Dr. Redmond School Cardiologist - Denies Hematology: Dr. Delton Coombes   PPM/ICD - Denies  Chest x-ray - 05/12/2022 EKG -  05/13/2022 Stress Test - Denies ECHO - 05/12/2022 Cardiac Cath - Denies  Sleep Study/sleep apnea/CPAP:  Denies  Non-diabetic   Blood Thinner Instructions: Denies Aspirin Instructions:Denies   ERAS Protcol - No, NPO PRE-SURGERY Ensure or G2- No   COVID TEST- DOS, 02/14/2023     Anesthesia review: Yes, HTN, aortic stenosis, COPD, Emphysema, Lower GI bleed   Patient denies shortness of breath, fever, cough and chest pain over the phone call    Your procedure is scheduled on Monday, February 14, 2023  Report to [facility] Main Entrance "A" at 0800 A.M., then check in with the Admitting office.  Call this number if you have problems the morning of surgery:  [phone]   If you have any questions prior to your surgery date call [phone]: Open Monday-Friday 8am-4pm If you experience any cold or flu symptoms such as cough, fever, chills, shortness of breath, etc. between now and your scheduled surgery, please notify us at the above number     Remember:  Do not eat or drink after midnight the night before your surgery  Take these medicines the morning of surgery with A SIP OF WATER:  Amlodipine, Advair, Lopressor, Pravastatin  As needed: None  As of today, STOP taking any Aspirin (unless otherwise instructed by your surgeon) Aleve, Naproxen, Ibuprofen, Motrin, Advil, Goody's, BC's, all herbal medications, fish oil, and all vitamins.

## 2023-02-10 ENCOUNTER — Encounter (HOSPITAL_COMMUNITY): Payer: Self-pay | Admitting: Emergency Medicine

## 2023-02-10 ENCOUNTER — Other Ambulatory Visit: Payer: Self-pay

## 2023-02-10 NOTE — Anesthesia Preprocedure Evaluation (Addendum)
Anesthesia Evaluation  Patient identified by MRN, date of birth, ID band Patient awake    Reviewed: Allergy & Precautions, NPO status , Patient's Chart, lab work & pertinent test results  Airway Mallampati: II  TM Distance: >3 FB Neck ROM: Full    Dental  (+) Missing   Pulmonary COPD,  COPD inhaler, Current Smoker and Patient abstained from smoking.   Pulmonary exam normal        Cardiovascular hypertension, Pt. on medications and Pt. on home beta blockers Normal cardiovascular exam  Aortic stenosis   Neuro/Psych negative neurological ROS     GI/Hepatic negative GI ROS, Neg liver ROS,,,  Endo/Other  negative endocrine ROS    Renal/GU negative Renal ROS     Musculoskeletal negative musculoskeletal ROS (+)    Abdominal   Peds  Hematology negative hematology ROS (+)   Anesthesia Other Findings lung mass  Reproductive/Obstetrics                             Anesthesia Physical Anesthesia Plan  ASA: 3  Anesthesia Plan: General   Post-op Pain Management:    Induction: Intravenous  PONV Risk Score and Plan: 2 and Ondansetron, Dexamethasone and Treatment may vary due to age or medical condition  Airway Management Planned: Oral ETT  Additional Equipment:   Intra-op Plan:   Post-operative Plan: Extubation in OR  Informed Consent: I have reviewed the patients History and Physical, chart, labs and discussed the procedure including the risks, benefits and alternatives for the proposed anesthesia with the patient or authorized representative who has indicated his/her understanding and acceptance.     Dental advisory given  Plan Discussed with: CRNA  Anesthesia Plan Comments: (See PAT note written 02/10/2023 by Myra Gianotti, PA-C. Smoker with hypermetabolic subsolid RML concerning for primary bronchogenic carcinoma.    Echo 05/12/22: IMPRESSIONS  1. Left ventricular ejection  fraction, by estimation, is 70 to 75%. The  left ventricle has hyperdynamic function. The left ventricle has no  regional wall motion abnormalities. There is severe left ventricular  hypertrophy. Left ventricular diastolic  parameters are consistent with Grade I diastolic dysfunction (impaired  relaxation). Elevated left atrial pressure.  2. Right ventricular systolic function is normal. The right ventricular  size is normal. There is normal pulmonary artery systolic pressure.  3. Left atrial size was mildly dilated.  4. The mitral valve is normal in structure. No evidence of mitral valve  regurgitation. No evidence of mitral stenosis. Moderate mitral annular  calcification.  5. The tricuspid valve is abnormal.  6. The aortic valve has an indeterminant number of cusps. There is severe  calcifcation of the aortic valve. There is severe thickening of the aortic  valve. Aortic valve regurgitation is mild to moderate. Moderate aortic  valve stenosis. Aortic valve mean  gradient measures 38.0 mmHg. Aortic valve peak gradient measures 65.0  mmHg. Aortic valve area, by VTI measures 1.09 cm.  7. The inferior vena cava is dilated in size with >50% respiratory  variability, suggesting right atrial pressure of 8 mmHg.    US Carotid 12/04/20: IMPRESSION: Heterogeneous plaque at the bilateral carotid bifurcation, with discordant results regarding degree of stenosis by established duplex criteria. Peak velocity suggests no significant stenosis, with the ICA/ CCA ratio suggesting a greater degree of stenosis. If establishing a more accurate degree of stenosis is required, cerebral angiogram should be considered, or as a second best test, CTA.  )  Anesthesia Quick Evaluation  

## 2023-02-10 NOTE — Progress Notes (Signed)
Anesthesia Chart Review: SAME DAY WORK-UP  Case: [id number] Date/Time: 02/14/23 1100   Procedures:      ROBOTIC ASSISTED NAVIGATIONAL BRONCHOSCOPY (Bilateral)     VIDEO BRONCHOSCOPY WITH ENDOBRONCHIAL ULTRASOUND (Bilateral)   Anesthesia type: General   Diagnosis: Lung mass [R91.8]   Pre-op diagnosis: lung mass   Location: [facility] / [place] [facility]   Surgeons: Collene Gobble, MD       DISCUSSION: Patient is an 89 year old female scheduled for the above procedure. Right lung mass noted on imaging done for rib pain following fall on 12/28/22. She has since been seen by oncology and pulmonology. Per notation by primary pulmonologist Dr. Loanne Drilling, "Referring for navigational bronchoscopy. Please review patient chart and images. Briefly 89 year old female active smoker with BMI 16 with moderate emphysema, moderate aortic stenosis, HTN, HLD with PET avid RUL/RML lung mass measuring 2.6 x 1.8 cm and RLL nodule 1.1 x 0.4 cm. Oncology requesting biopsy with plan for SBRT. 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  [facility] admission 05/12/22-05/13/22 for cute hypoxemic respiratory failure secondary to volume overload and bilateral pleural effusions, suspected from acute on chronic diastolic CHF. Improved after IV Lasix and s/p left thoracentesis, 1.1L. 05/12/22 echo showed LVEF 70-75%, no regional wall motion  maladies, grade 1 diastolic dysfunction, normal RV systolic function, normal pulmonary artery systolic pressure, moderate mitral annular calcification, aortic valve with indeterminate number of cusps with severe aortic valve calcification, severe thickening of the aortic valve, mild to moderate aortic valve regurgitation, moderate aortic stenosis with AV mean gradient 38 mmHg, AV peak gradient 65 mmHg, AVA by VTI 1.09 cm.  (She previously saw cardiologist Dr. Johney Frame on 01/06/21 for mild AS, but has not been seen by cardiology since. She also saw Dr. Donnetta Hutching for suspected carotid stenosis on 12/29/20, but has not had repeat carotid imaging. See PROVIDERS below for summary of last visits.)   June 2023 echo results reviewed with anesthesiologist Oleta Mouse, MD. Per Dr. Cordelia Pen 02/03/23 evaluation, patinent without SOB, chest pain, dizziness, or leg swelling. She lives alone and can perform ADLs independently. She does walk with a cane outside of the home. + Smokers cough. Anesthesia team to evaluate on the day of surgery.    VS: Ht 5\' 1"  (1.549 m)   Wt 41.7 kg   BMI 17.38 kg/m  BP Readings from Last 3 Encounters:  02/03/23 128/60  01/17/23 (!) 158/85  01/03/23 (!) 146/83   Pulse Readings from Last 3 Encounters:  02/03/23 70  01/17/23 75  01/03/23 68     PROVIDERS: Redmond School, MD is PCP [facility], [phone]) Derek Jack, MD is Kathlene Cote, MD is pulmonologist Gwyndolyn Kaufman, MD is cardiologist. Last visit 12/17/20 for mild AS, suspected carotid artery stenosis, new parapneumonic effusion. Six month cardiology follow-up recommended. Keep pulmonology and vascular surgery appointments.  Curt Jews, MD is vascular surgeon. Visit on 12/29/20.  He reviewed carotid duplex. No significant increases in systolic or end-diastolic ICA flow but some concern regarding discordant readings comparing ICA/CCA ratio. She  asymptomatic, but with harsh  bruits--but possibly related to transmission for AS murmur. He recommended repeat Duplex I six months.     LABS: For day of surgery as indicated.    IMAGES: MRI Brain 01/14/23: IMPRESSION: Negative for metastatic disease.  PET Scan 01/06/23:  IMPRESSION: 1. Hypermetabolic subsolid mass in the right middle lobe, compatible with primary bronchogenic carcinoma (T2b N0 M0, stage II A disease). Hypermetabolic 6 mm right parotid nodule. Malignancy cannot be excluded. 2. Vague 8 mm nodule in the right lower lobe, too small for PET resolution. Recommend continued attention on follow-up. 3. Trace left pleural fluid. 4. Left adrenal adenoma. 5. Aortic atherosclerosis (ICD10-I70.0). Coronary artery calcification. 6.  Emphysema (ICD10-J43.9).   CT Chest 12/28/22: IMPRESSION: 1. No acute posttraumatic findings identified in the chest. 2. Spiculated mass anteriorly in the right lung is highly suspicious for bronchogenic carcinoma. This appears to cross the minor fissure and involve both the upper and middle lobes. Recommend referral to multi disciplinary thoracic oncology service. PET-CT may be helpful for further staging. 3. Ill-defined nodularity in the superior segment of the right lower lobe is nonspecific, potentially inflammatory. 4. Old left-sided rib fracture. No acute osseous findings identified. 5. Aortic Atherosclerosis (ICD10-I70.0) and Emphysema (ICD10-J43.9).     EKG: 05/12/22: Sinus rhythm Borderline repolarization abnormality Borderline prolonged QT interval Confirmed by Milton Ferguson [phone]) on 05/12/2022 9:50:23 AM   CV: Echo 05/12/22: IMPRESSIONS   1. Left ventricular ejection fraction, by estimation, is 70 to 75%. The  left ventricle has hyperdynamic function. The left ventricle has no  regional wall motion abnormalities. There is severe left ventricular  hypertrophy. Left ventricular diastolic  parameters are consistent with Grade I diastolic dysfunction  (impaired  relaxation). Elevated left atrial pressure.   2. Right ventricular systolic function is normal. The right ventricular  size is normal. There is normal pulmonary artery systolic pressure.   3. Left atrial size was mildly dilated.   4. The mitral valve is normal in structure. No evidence of mitral valve  regurgitation. No evidence of mitral stenosis. Moderate mitral annular  calcification.   5. The tricuspid valve is abnormal.   6. The aortic valve has an indeterminant number of cusps. There is severe  calcifcation of the aortic valve. There is severe thickening of the aortic  valve. Aortic valve regurgitation is mild to moderate. Moderate aortic  valve stenosis. Aortic valve mean   gradient measures 38.0 mmHg. Aortic valve peak gradient measures 65.0  mmHg. Aortic valve area, by VTI measures 1.09 cm.   7. The inferior vena cava is dilated in size with >50% respiratory  variability, suggesting right atrial pressure of 8 mmHg.    US Carotid 12/04/20: IMPRESSION: Heterogeneous plaque at the bilateral carotid bifurcation, with discordant results regarding degree of stenosis by established duplex criteria. Peak velocity suggests no significant stenosis, with the ICA/ CCA ratio suggesting a greater degree of stenosis. If establishing a more accurate degree of stenosis is required, cerebral angiogram should be considered, or as a second best test, CTA.    Past Medical History:  Diagnosis Date   Anemia    pt denies, yet takes iron   Chronic airway obstruction, not elsewhere classified    Complication of anesthesia    COPD (chronic obstructive pulmonary disease) (HCC)    Diverticulosis    GI hemorrhage from post-treatment cecal ulcer 08/18/2012   Hemorrhoids    HTN (hypertension)    Hypercholesteremia    Lower GI bleed multiple   AVM's  PONV (postoperative nausea and vomiting)     Past Surgical History:  Procedure Laterality Date   BUNIONECTOMY     left    COLECTOMY     COLON RESECTION N/A 01/17/2021   Procedure: EXPLORATORY LAPAROTOMY COMPLETION COLECTOMY ileostomy;  Surgeon: Ralene Ok, MD;  Location: [facility];  Service: General;  Laterality: N/A;   COLONOSCOPY  08/10/2012   Procedure: COLONOSCOPY;  Surgeon: Ladene Artist, MD,FACG;  Location: [facility];  Service: Endoscopy;  Laterality: N/A;   COLONOSCOPY  08/19/2012   Procedure: COLONOSCOPY;  Surgeon: Gatha Mayer, MD;  Location: [facility];  Service: Endoscopy;  Laterality: N/A;   COLONOSCOPY N/A 03/11/2013   Procedure: COLONOSCOPY;  Surgeon: Irene Shipper, MD;  Location: [facility];  Service: Endoscopy;  Laterality: N/A;   COLONOSCOPY N/A 01/16/2021   Procedure: COLONOSCOPY;  Surgeon: Doran Stabler, MD;  Location: [facility];  Service: Gastroenterology;  Laterality: N/A;   COLONOSCOPY WITH PROPOFOL N/A 01/10/2021   Procedure: COLONOSCOPY WITH PROPOFOL;  Surgeon: Irene Shipper, MD;  Location: [facility];  Service: Endoscopy;  Laterality: N/A;   COLOSTOMY N/A 01/15/2021   Procedure: COLOSTOMY;  Surgeon: Erroll Luna, MD;  Location: [facility];  Service: General;  Laterality: N/A;   ESOPHAGOGASTRODUODENOSCOPY (EGD) WITH PROPOFOL N/A 01/14/2021   Procedure: ESOPHAGOGASTRODUODENOSCOPY (EGD) WITH PROPOFOL;  Surgeon: Doran Stabler, MD;  Location: [facility];  Service: Endoscopy;  Laterality: N/A;   FLEXIBLE SIGMOIDOSCOPY N/A 01/14/2021   Procedure: FLEXIBLE SIGMOIDOSCOPY;  Surgeon: Doran Stabler, MD;  Location: [facility];  Service: Endoscopy;  Laterality: N/A;   HOT HEMOSTASIS N/A 01/10/2021   Procedure: HOT HEMOSTASIS (ARGON PLASMA COAGULATION/BICAP);  Surgeon: Irene Shipper, MD;  Location: [facility];  Service: Endoscopy;  Laterality: N/A;   IR ANGIOGRAM SELECTIVE EACH ADDITIONAL VESSEL  01/12/2021   IR ANGIOGRAM SELECTIVE EACH ADDITIONAL VESSEL  01/12/2021   IR ANGIOGRAM SELECTIVE EACH ADDITIONAL VESSEL  01/12/2021   IR ANGIOGRAM VISCERAL SELECTIVE  01/09/2021   IR  ANGIOGRAM VISCERAL SELECTIVE  01/12/2021   IR EMBO ARTERIAL NOT HEMORR HEMANG INC GUIDE ROADMAPPING  01/12/2021   IR US GUIDE VASC ACCESS RIGHT  01/09/2021   IR US GUIDE VASC ACCESS RIGHT  01/12/2021   PARTIAL COLECTOMY N/A 01/15/2021   Procedure: PARTIAL COLECTOMY;  Surgeon: Erroll Luna, MD;  Location: [facility];  Service: General;  Laterality: N/A;   POLYPECTOMY  01/10/2021   Procedure: POLYPECTOMY;  Surgeon: Irene Shipper, MD;  Location: [facility];  Service: Endoscopy;;   TONSILLECTOMY      MEDICATIONS: No current facility-administered medications for this encounter.    acetaminophen (TYLENOL) 500 MG tablet   amLODipine (NORVASC) 5 MG tablet   Calcium Carbonate Antacid (CALCIUM CARBONATE PO)   cephALEXin (KEFLEX) 500 MG capsule   ECHINACEA PO   Ferrous Sulfate 27 MG TABS   fluticasone-salmeterol (ADVAIR) 100-50 MCG/ACT AEPB   furosemide (LASIX) 20 MG tablet   ibuprofen (ADVIL) 200 MG tablet   metoprolol tartrate (LOPRESSOR) 25 MG tablet   Multiple Vitamins-Minerals (MULTIVITAMIN WITH MINERALS) tablet   neomycin-polymyxin-pramoxine (NEOSPORIN PLUS) 1 % cream   Omega-3 Fatty Acids (FISH OIL) 1000 MG CAPS   Potassium 99 MG TABS   pravastatin (PRAVACHOL) 10 MG tablet   gabapentin (NEURONTIN) 300 MG capsule   Ipratropium-Albuterol (COMBIVENT RESPIMAT) 20-100 MCG/ACT AERS respimat   lidocaine (LIDODERM) 5 %    Myra Gianotti, PA-C Surgical Short Stay/Anesthesiology [facility] Phone [phone] [facility] Phone [phone] 02/10/2023 1:22 PM

## 2023-02-14 ENCOUNTER — Ambulatory Visit (HOSPITAL_COMMUNITY): Payer: Medicare Other

## 2023-02-14 ENCOUNTER — Ambulatory Visit (HOSPITAL_BASED_OUTPATIENT_CLINIC_OR_DEPARTMENT_OTHER): Payer: Medicare Other | Admitting: Vascular Surgery

## 2023-02-14 ENCOUNTER — Ambulatory Visit (HOSPITAL_COMMUNITY)
Admission: RE | Admit: 2023-02-14 | Discharge: 2023-02-14 | Disposition: A | Discharge status: Home / Self Care | Payer: Medicare Other | Attending: Emergency Medicine | Admitting: Emergency Medicine

## 2023-02-14 ENCOUNTER — Encounter (HOSPITAL_COMMUNITY)
Admission: RE | Disposition: A | Discharge status: Home / Self Care | Payer: Self-pay | Source: Home / Self Care | Attending: Emergency Medicine

## 2023-02-14 ENCOUNTER — Ambulatory Visit (HOSPITAL_COMMUNITY): Payer: Medicare Other | Admitting: Vascular Surgery

## 2023-02-14 ENCOUNTER — Encounter (HOSPITAL_COMMUNITY): Payer: Self-pay | Admitting: Emergency Medicine

## 2023-02-14 DIAGNOSIS — J90 Pleural effusion, not elsewhere classified: Secondary | ICD-10-CM | POA: Diagnosis not present

## 2023-02-14 DIAGNOSIS — Z803 Family history of malignant neoplasm of breast: Secondary | ICD-10-CM | POA: Diagnosis not present

## 2023-02-14 DIAGNOSIS — J439 Emphysema, unspecified: Secondary | ICD-10-CM | POA: Insufficient documentation

## 2023-02-14 DIAGNOSIS — R918 Other nonspecific abnormal finding of lung field: Secondary | ICD-10-CM | POA: Insufficient documentation

## 2023-02-14 DIAGNOSIS — Z01818 Encounter for other preprocedural examination: Secondary | ICD-10-CM | POA: Diagnosis not present

## 2023-02-14 DIAGNOSIS — I10 Essential (primary) hypertension: Secondary | ICD-10-CM

## 2023-02-14 DIAGNOSIS — Z1152 Encounter for screening for COVID-19: Secondary | ICD-10-CM | POA: Diagnosis not present

## 2023-02-14 DIAGNOSIS — Z9049 Acquired absence of other specified parts of digestive tract: Secondary | ICD-10-CM | POA: Diagnosis not present

## 2023-02-14 DIAGNOSIS — I080 Rheumatic disorders of both mitral and aortic valves: Secondary | ICD-10-CM | POA: Diagnosis not present

## 2023-02-14 DIAGNOSIS — R911 Solitary pulmonary nodule: Secondary | ICD-10-CM | POA: Diagnosis not present

## 2023-02-14 DIAGNOSIS — E7800 Pure hypercholesterolemia, unspecified: Secondary | ICD-10-CM | POA: Insufficient documentation

## 2023-02-14 DIAGNOSIS — R590 Localized enlarged lymph nodes: Secondary | ICD-10-CM | POA: Diagnosis not present

## 2023-02-14 DIAGNOSIS — F17210 Nicotine dependence, cigarettes, uncomplicated: Secondary | ICD-10-CM

## 2023-02-14 DIAGNOSIS — C3491 Malignant neoplasm of unspecified part of right bronchus or lung: Secondary | ICD-10-CM

## 2023-02-14 DIAGNOSIS — Z801 Family history of malignant neoplasm of trachea, bronchus and lung: Secondary | ICD-10-CM | POA: Insufficient documentation

## 2023-02-14 DIAGNOSIS — J984 Other disorders of lung: Secondary | ICD-10-CM | POA: Diagnosis not present

## 2023-02-14 DIAGNOSIS — C3490 Malignant neoplasm of unspecified part of unspecified bronchus or lung: Secondary | ICD-10-CM

## 2023-02-14 HISTORY — PX: VIDEO BRONCHOSCOPY WITH RADIAL ENDOBRONCHIAL ULTRASOUND: SHX6849

## 2023-02-14 HISTORY — PX: BRONCHIAL NEEDLE ASPIRATION BIOPSY: SHX5106

## 2023-02-14 HISTORY — PX: BRONCHIAL BRUSHINGS: SHX5108

## 2023-02-14 HISTORY — DX: Other complications of anesthesia, initial encounter: T88.59XA

## 2023-02-14 HISTORY — DX: Other specified postprocedural states: Z98.890

## 2023-02-14 HISTORY — PX: BRONCHIAL WASHINGS: SHX5105

## 2023-02-14 HISTORY — PX: BRONCHIAL BIOPSY: SHX5109

## 2023-02-14 HISTORY — DX: Malignant neoplasm of unspecified part of unspecified bronchus or lung: C34.90

## 2023-02-14 HISTORY — PX: FIDUCIAL MARKER PLACEMENT: SHX6858

## 2023-02-14 HISTORY — PX: VIDEO BRONCHOSCOPY WITH ENDOBRONCHIAL ULTRASOUND: SHX6177

## 2023-02-14 HISTORY — DX: Nausea with vomiting, unspecified: R11.2

## 2023-02-14 LAB — POCT I-STAT, CHEM 8
BUN: 11 mg/dL (ref 8–23)
Calcium, Ion: 1.12 mmol/L — ABNORMAL LOW (ref 1.15–1.40)
Chloride: 102 mmol/L (ref 98–111)
Creatinine, Ser: 0.7 mg/dL (ref 0.44–1.00)
Glucose, Bld: 103 mg/dL — ABNORMAL HIGH (ref 70–99)
HCT: 38 % (ref 36.0–46.0)
Hemoglobin: 12.9 g/dL (ref 12.0–15.0)
Potassium: 3.5 mmol/L (ref 3.5–5.1)
Sodium: 141 mmol/L (ref 135–145)
TCO2: 26 mmol/L (ref 22–32)

## 2023-02-14 LAB — CBC
HCT: 37.9 % (ref 36.0–46.0)
Hemoglobin: 13.2 g/dL (ref 12.0–15.0)
MCH: 32.5 pg (ref 26.0–34.0)
MCHC: 34.8 g/dL (ref 30.0–36.0)
MCV: 93.3 fL (ref 80.0–100.0)
Platelets: 306 10*3/uL (ref 150–400)
RBC: 4.06 MIL/uL (ref 3.87–5.11)
RDW: 14.5 % (ref 11.5–15.5)
WBC: 8.4 10*3/uL (ref 4.0–10.5)
nRBC: 0 % (ref 0.0–0.2)

## 2023-02-14 LAB — SARS CORONAVIRUS 2 BY RT PCR: SARS Coronavirus 2 by RT PCR: NEGATIVE

## 2023-02-14 SURGERY — BRONCHOSCOPY, WITH BIOPSY USING ELECTROMAGNETIC NAVIGATION
Anesthesia: General | Laterality: Bilateral

## 2023-02-14 MED ORDER — PHENYLEPHRINE HCL-NACL 20-0.9 MG/250ML-% IV SOLN
INTRAVENOUS | Status: DC | PRN
  Administered 2023-02-14: 50 ug/min via INTRAVENOUS

## 2023-02-14 MED ORDER — SUGAMMADEX SODIUM 200 MG/2ML IV SOLN
INTRAVENOUS | Status: DC | PRN
  Administered 2023-02-14: 200 mg via INTRAVENOUS

## 2023-02-14 MED ORDER — PROPOFOL 10 MG/ML IV BOLUS
INTRAVENOUS | Status: DC | PRN
  Administered 2023-02-14: 100 mg via INTRAVENOUS
  Administered 2023-02-14: 20 mg via INTRAVENOUS

## 2023-02-14 MED ORDER — AMISULPRIDE (ANTIEMETIC) 5 MG/2ML IV SOLN: 10.0000 mg | Freq: Once | INTRAVENOUS | Status: DC | PRN

## 2023-02-14 MED ORDER — FENTANYL CITRATE (PF) 100 MCG/2ML IJ SOLN
INTRAMUSCULAR | Status: AC
  Filled 2023-02-14: qty 2

## 2023-02-14 MED ORDER — ONDANSETRON HCL 4 MG/2ML IJ SOLN: 4.0000 mg | Freq: Once | INTRAMUSCULAR | Status: DC | PRN

## 2023-02-14 MED ORDER — ACETAMINOPHEN 500 MG PO TABS: 1000.0000 mg | ORAL_TABLET | Freq: Four times a day (QID) | ORAL | Status: AC | PRN

## 2023-02-14 MED ORDER — ONDANSETRON HCL 4 MG/2ML IJ SOLN
INTRAMUSCULAR | Status: DC | PRN
  Administered 2023-02-14: 4 mg via INTRAVENOUS

## 2023-02-14 MED ORDER — LIDOCAINE 2% (20 MG/ML) 5 ML SYRINGE
INTRAMUSCULAR | Status: DC | PRN
  Administered 2023-02-14: 60 mg via INTRAVENOUS

## 2023-02-14 MED ORDER — FENTANYL CITRATE (PF) 250 MCG/5ML IJ SOLN
INTRAMUSCULAR | Status: DC | PRN
  Administered 2023-02-14: 100 ug via INTRAVENOUS

## 2023-02-14 MED ORDER — PHENYLEPHRINE 80 MCG/ML (10ML) SYRINGE FOR IV PUSH (FOR BLOOD PRESSURE SUPPORT)
PREFILLED_SYRINGE | INTRAVENOUS | Status: DC | PRN
  Administered 2023-02-14: 80 ug via INTRAVENOUS
  Administered 2023-02-14: 160 ug via INTRAVENOUS

## 2023-02-14 MED ORDER — LACTATED RINGERS IV SOLN: INTRAVENOUS | Status: DC

## 2023-02-14 MED ORDER — CHLORHEXIDINE GLUCONATE 0.12 % MT SOLN
15.0000 mL | Freq: Once | OROMUCOSAL | Status: AC
  Filled 2023-02-14: qty 15

## 2023-02-14 MED ORDER — ACETAMINOPHEN 10 MG/ML IV SOLN: 650.0000 mg | Freq: Once | INTRAVENOUS | Status: DC | PRN

## 2023-02-14 MED ORDER — CHLORHEXIDINE GLUCONATE 0.12 % MT SOLN
OROMUCOSAL | Status: AC
  Administered 2023-02-14: 15 mL via OROMUCOSAL
  Filled 2023-02-14: qty 15

## 2023-02-14 MED ORDER — ROCURONIUM BROMIDE 10 MG/ML (PF) SYRINGE
PREFILLED_SYRINGE | INTRAVENOUS | Status: DC | PRN
  Administered 2023-02-14: 15 mg via INTRAVENOUS
  Administered 2023-02-14: 10 mg via INTRAVENOUS
  Administered 2023-02-14: 40 mg via INTRAVENOUS

## 2023-02-14 MED ORDER — FENTANYL CITRATE (PF) 100 MCG/2ML IJ SOLN: 25.0000 ug | INTRAMUSCULAR | Status: DC | PRN

## 2023-02-14 MED ORDER — DEXAMETHASONE SODIUM PHOSPHATE 10 MG/ML IJ SOLN
INTRAMUSCULAR | Status: DC | PRN
  Administered 2023-02-14: 4 mg via INTRAVENOUS

## 2023-02-14 SURGICAL SUPPLY — 1 items: superlock fiducial marker IMPLANT

## 2023-02-14 NOTE — Anesthesia Postprocedure Evaluation (Signed)
Anesthesia Post Note  Patient: JAVONDA ESSIG  Procedure(s) Performed: ROBOTIC ASSISTED NAVIGATIONAL BRONCHOSCOPY (Bilateral) VIDEO BRONCHOSCOPY WITH ENDOBRONCHIAL ULTRASOUND (Bilateral) VIDEO BRONCHOSCOPY WITH RADIAL ENDOBRONCHIAL ULTRASOUND BRONCHIAL BRUSHINGS BRONCHIAL NEEDLE ASPIRATION BIOPSIES BRONCHIAL BIOPSIES FIDUCIAL MARKER PLACEMENT BRONCHIAL WASHINGS     Patient location during evaluation: PACU Anesthesia Type: General Level of consciousness: awake Pain management: pain level controlled Vital Signs Assessment: post-procedure vital signs reviewed and stable Respiratory status: spontaneous breathing, nonlabored ventilation and respiratory function stable Cardiovascular status: blood pressure returned to baseline and stable Postop Assessment: no apparent nausea or vomiting Anesthetic complications: no   No notable events documented.  Last Vitals:  Vitals:   02/14/23 1315 02/14/23 1330  BP: 121/65 109/65  Pulse: 85 86  Resp: 16 16  Temp:  36.5 C  SpO2: 92% 93%    Last Pain:  Vitals:   02/14/23 1315  TempSrc:   PainSc: 0-No pain                 Jashira Cotugno P Jasen Hartstein

## 2023-02-14 NOTE — Transfer of Care (Signed)
Immediate Anesthesia Transfer of Care Note  Patient: Kathleen Pope  Procedure(s) Performed: ROBOTIC ASSISTED NAVIGATIONAL BRONCHOSCOPY (Bilateral) VIDEO BRONCHOSCOPY WITH ENDOBRONCHIAL ULTRASOUND (Bilateral) VIDEO BRONCHOSCOPY WITH RADIAL ENDOBRONCHIAL ULTRASOUND BRONCHIAL BRUSHINGS BRONCHIAL NEEDLE ASPIRATION BIOPSIES BRONCHIAL BIOPSIES FIDUCIAL MARKER PLACEMENT BRONCHIAL WASHINGS  Patient Location: PACU  Anesthesia Type:General  Level of Consciousness: awake, alert , and oriented  Airway & Oxygen Therapy: Patient Spontanous Breathing and Patient connected to face mask oxygen  Post-op Assessment: Report given to RN and Post -op Vital signs reviewed and stable  Post vital signs: Reviewed and stable  Last Vitals:  Vitals Value Taken Time  BP 135/73 02/14/23 1251  Temp    Pulse 87 02/14/23 1256  Resp 24 02/14/23 1256  SpO2 95 % 02/14/23 1256  Vitals shown include unvalidated device data.  Last Pain:  Vitals:   02/14/23 0830  TempSrc:   PainSc: 0-No pain         Complications: No notable events documented.

## 2023-02-14 NOTE — Discharge Instructions (Signed)
Flexible Bronchoscopy, Care After This sheet gives you information about how to care for yourself after your test. Your doctor may also give you more specific instructions. If you have problems or questions, contact your doctor. Follow these instructions at home: Eating and drinking When your numbness is gone and your cough and gag reflexes have come back, you may: Eat only soft foods. Slowly drink liquids. The day after the test, go back to your normal diet. Driving Do not drive for 24 hours if you were given a medicine to help you relax (sedative). Do not drive or use heavy machinery while taking prescription pain medicine. General instructions  Take over-the-counter and prescription medicines only as told by your doctor. Return to your normal activities as told. Ask what activities are safe for you. Do not use any products that have nicotine or tobacco in them. This includes cigarettes and e-cigarettes. If you need help quitting, ask your doctor. Keep all follow-up visits as told by your doctor. This is important. It is very important if you had a tissue sample (biopsy) taken. Get help right away if: You have shortness of breath that gets worse. You get light-headed. You feel like you are going to pass out (faint). You have chest pain. You cough up: More than a little blood. More blood than before. Summary Do not eat or drink anything (not even water) for 2 hours after your test, or until your numbing medicine wears off. Do not use cigarettes. Do not use e-cigarettes. Get help right away if you have chest pain.  Please call our office for any questions or concerns.  [phone].  This information is not intended to replace advice given to you by your health care provider. Make sure you discuss any questions you have with your health care provider. Document Released: 08/29/2009 Document Revised: 10/14/2017 Document Reviewed: 11/19/2016 Elsevier Patient Education  2020 Elsevier  Inc.  

## 2023-02-14 NOTE — Interval H&P Note (Signed)
History and Physical Interval Note:  02/14/2023 9:26 AM  Kathleen Pope  has presented today for surgery, with the diagnosis of lung mass.  The various methods of treatment have been discussed with the patient and family. After consideration of risks, benefits and other options for treatment, the patient has consented to  Procedure(s): ROBOTIC ASSISTED NAVIGATIONAL BRONCHOSCOPY (Bilateral) VIDEO BRONCHOSCOPY WITH ENDOBRONCHIAL ULTRASOUND (Bilateral) as a surgical intervention.  The patient's history has been reviewed, patient examined, no change in status, stable for surgery.  I have reviewed the patient's chart and labs.  Questions were answered to the patient's satisfaction.     Collene Gobble

## 2023-02-14 NOTE — Progress Notes (Signed)
Pt Niece Ulis Rias called for verbal consent for endobronchial ultrasound since pt was in procedure and under sedation. Ulis Rias responded "Yes, absolutely." Verbal consent heard by this RN and Dr. Lamonte Sakai. Consent order modified to add endobronchial ultrasound

## 2023-02-14 NOTE — Op Note (Signed)
Video Bronchoscopy with Robotic Assisted Bronchoscopic Navigation and Endobronchial Ultrasound Procedure Note  Date of Operation: 02/14/2023   Pre-op Diagnosis: Right upper lobe mass, right lower lobe nodule, mediastinal adenopathy  Post-op Diagnosis: Same  Surgeon: Baltazar Apo  Assistants: None  Anesthesia: General endotracheal anesthesia  Operation: Flexible video fiberoptic bronchoscopy with robotic assistance and biopsies.  Estimated Blood Loss: Minimal  Complications: None  Indications and History: Kathleen Pope is a 89 y.o. female with history of tobacco use who was found to have a right upper lobe mass, right lower lobe superior segmental nodule suspicious for malignancy.  Also some mild mediastinal adenopathy.  Recommendation made to achieve a tissue diagnosis via robotic assisted navigational bronchoscopy and endobronchial ultrasound biopsies. The risks, benefits, complications, treatment options and expected outcomes were discussed with the patient.  The possibilities of pneumothorax, pneumonia, reaction to medication, pulmonary aspiration, perforation of a viscus, bleeding, failure to diagnose a condition and creating a complication requiring transfusion or operation were discussed with the patient who freely signed the consent.    Description of Procedure: The patient was seen in the Preoperative Area, was examined and was deemed appropriate to proceed.  The patient was taken to [facility] endoscopy room 3, identified as Kathleen Pope and the procedure verified as Flexible Video Fiberoptic Bronchoscopy.  A Time Out was held and the above information confirmed.   Prior to the date of the procedure a high-resolution CT scan of the chest was performed. Utilizing ION software program a virtual tracheobronchial tree was generated to allow the creation of distinct navigation pathways to the patient's parenchymal abnormalities. After being taken to the operating room general anesthesia was  initiated and the patient  was orally intubated. The video fiberoptic bronchoscope was introduced via the endotracheal tube and a general inspection was performed which showed normal right and left lung anatomy.  The bronchus intermedius and distal airways into the right lower lobe and right middle lobe were somewhat irregular, hyperemic and hypervascular but without any discrete endobronchial lesion. Aspiration of the bilateral mainstems was completed to remove any remaining secretions. Robotic catheter inserted into patient's endotracheal tube.   Target #1 right upper lobe mass: The distinct navigation pathways prepared prior to this procedure were then utilized to navigate to patient's lesion identified on CT scan. The robotic catheter was secured into place and the vision probe was withdrawn.  Lesion location was approximated using fluoroscopy and radial endobronchial ultrasound for peripheral targeting. Under fluoroscopic guidance transbronchial needle brushings, transbronchial needle biopsies, and transbronchial forceps biopsies were performed to be sent for cytology and pathology.  Under fluoroscopic guidance a single fiducial marker was placed adjacent to the nodule.    Target #2 right lower lobe superior segmental nodule: The distinct navigation pathways prepared prior to this procedure were then utilized to navigate to patient's lesion identified on CT scan. The robotic catheter was secured into place and the vision probe was withdrawn.  Lesion location was approximated using fluoroscopy and local registration and targeting was performed using Cios three-dimensional imaging.  Needle-in-lesion was established using Cios. Under fluoroscopic guidance transbronchial needle brushings, transbronchial needle biopsies, and transbronchial forceps biopsies were performed to be sent for cytology and pathology.  Under fluoroscopic guidance a single fiducial marker was placed adjacent to the nodule.  A  bronchioalveolar lavage was performed in the right lower lobe and sent for cytology.  The standard scope was then withdrawn and the endobronchial ultrasound was used to identify and characterize the peritracheal, hilar and  bronchial lymph nodes. Inspection showed enlargement at station 4L and station 7. Using real-time ultrasound guidance Wang needle biopsies were take from Station L and 7 nodes and were sent for cytology.   At the end of the procedure a general airway inspection was performed and there was no evidence of active bleeding. The bronchoscope was removed.  The patient tolerated the procedure well. There was no significant blood loss and there were no obvious complications. A post-procedural chest x-ray is pending.  Samples Target #1: 1. Transbronchial needle brushings from right upper lobe mass 2. Transbronchial Wang needle biopsies from right upper lobe mass 3. Transbronchial forceps biopsies from right upper lobe mass  Samples Target #2: 1. Transbronchial needle brushings from right lower lobe superior segment nodule 2. Transbronchial Wang needle biopsies from right lower lobe superior segment nodule 3. Transbronchial forceps biopsies from right lower lobe superior segment nodule 4. Bronchoalveolar lavage from right lower lobe  EBUS samples: 1. Wang needle biopsies from 4L node 2. Wang needle biopsies from 7 node   Plans:  The patient will be discharged from the PACU to home when recovered from anesthesia and after chest x-ray is reviewed. We will review the cytology, pathology and microbiology results with the patient when they become available. Outpatient followup will be with Dr. Loanne Drilling or Dr. Lamonte Sakai.   Baltazar Apo, MD, PhD 02/14/2023, 12:51 PM [facility] [phone] or if no answer before 7:00PM call [phone] For any issues after 7:00PM please call eLink [phone]

## 2023-02-14 NOTE — Anesthesia Procedure Notes (Signed)
Procedure Name: Intubation Date/Time: 02/14/2023 10:48 AM  Performed by: Reeves Dam, CRNAPre-anesthesia Checklist: Patient identified, Emergency Drugs available, Suction available and Patient being monitored Patient Re-evaluated:Patient Re-evaluated prior to induction Oxygen Delivery Method: Circle system utilized Preoxygenation: Pre-oxygenation with 100% oxygen Induction Type: IV induction Ventilation: Mask ventilation without difficulty Laryngoscope Size: Miller and 2 Tube type: Oral Tube size: 8.5 mm Number of attempts: 1 Airway Equipment and Method: Stylet and Oral airway Placement Confirmation: ETT inserted through vocal cords under direct vision, positive ETCO2 and breath sounds checked- equal and bilateral Secured at: 21 cm Tube secured with: Tape Dental Injury: Teeth and Oropharynx as per pre-operative assessment

## 2023-02-15 ENCOUNTER — Encounter (HOSPITAL_COMMUNITY): Payer: Self-pay | Admitting: Emergency Medicine

## 2023-02-15 LAB — CYTOLOGY - NON PAP

## 2023-02-16 ENCOUNTER — Telehealth: Payer: Self-pay | Admitting: Emergency Medicine

## 2023-02-16 NOTE — Telephone Encounter (Signed)
Reviewed the patient's pathology with her niece by phone.  Unfortunately all of the right upper lobe samples only show atypical cells, did not give a full diagnosis.  Question whether the lesion may have been necrotic because it was easily reached and was actually visible in the airway which makes sampling air very unlikely.  The smaller right lower lobe nodule showed atypical cells as well. I will review the slides with pathology to see if any more information can be obtained from the existing slides.  Once I have discussed with pathology then we will decide how to refer the patient for next steps in evaluation and treatment.

## 2023-02-17 DIAGNOSIS — X32XXXD Exposure to sunlight, subsequent encounter: Secondary | ICD-10-CM | POA: Diagnosis not present

## 2023-02-17 DIAGNOSIS — Z85828 Personal history of other malignant neoplasm of skin: Secondary | ICD-10-CM | POA: Diagnosis not present

## 2023-02-17 DIAGNOSIS — Z08 Encounter for follow-up examination after completed treatment for malignant neoplasm: Secondary | ICD-10-CM | POA: Diagnosis not present

## 2023-02-17 DIAGNOSIS — L570 Actinic keratosis: Secondary | ICD-10-CM | POA: Diagnosis not present

## 2023-02-17 NOTE — Telephone Encounter (Signed)
I added her case on to Thoracic Conference for next Thursday 02/24/23.   Will send FYI to Dr Saralyn Pilar

## 2023-02-19 DIAGNOSIS — C3490 Malignant neoplasm of unspecified part of unspecified bronchus or lung: Secondary | ICD-10-CM | POA: Diagnosis not present

## 2023-02-21 ENCOUNTER — Inpatient Hospital Stay: Payer: Medicare Other | Attending: Hematology | Admitting: Hematology

## 2023-02-21 VITALS — BP 135/68 | HR 71 | Temp 98.2°F | Resp 18 | Ht 61.5 in | Wt 95.7 lb

## 2023-02-21 DIAGNOSIS — F17210 Nicotine dependence, cigarettes, uncomplicated: Secondary | ICD-10-CM | POA: Diagnosis not present

## 2023-02-21 DIAGNOSIS — R918 Other nonspecific abnormal finding of lung field: Secondary | ICD-10-CM | POA: Insufficient documentation

## 2023-02-21 DIAGNOSIS — J984 Other disorders of lung: Secondary | ICD-10-CM

## 2023-02-21 DIAGNOSIS — Z803 Family history of malignant neoplasm of breast: Secondary | ICD-10-CM | POA: Insufficient documentation

## 2023-02-21 NOTE — Patient Instructions (Signed)
[facility]  Discharge Instructions  You were seen and examined today by Dr. Ellin Saba.  Dr. Ellin Saba discussed your most recent lab work and biopsy which revealed that it was not as conclusive as we had hoped. Dr. Ellin Saba is going to discuss these findings with the tumor board for second opinion to come up with a plan of treatment. He will call you next week to let you know if they decide that another biopsy should be taken or if should  just go on with Radiation.   Follow-up as scheduled in one week.    Thank you for choosing [facility] to provide your oncology and hematology care.   To afford each patient quality time with our provider, please arrive at least 15 minutes before your scheduled appointment time. You may need to reschedule your appointment if you arrive late (10 or more minutes). Arriving late affects you and other patients whose appointments are after yours.  Also, if you miss three or more appointments without notifying the office, you may be dismissed from the clinic at the provider's discretion.    Again, thank you for choosing [facility].  Our hope is that these requests will decrease the amount of time that you wait before being seen by our physicians.   If you have a lab appointment with the Cancer Center - please note that after April 8th, all labs will be drawn in the cancer center.  You do not have to check in or register with the main entrance as you have in the past but will complete your check-in at the cancer center.            _____________________________________________________________  Should you have questions after your visit to [facility], please contact our office at [phone] and follow the prompts.  Our office hours are 8:00 a.m. to 4:30 p.m. Monday - Thursday and 8:00 a.m. to 2:30 p.m. Friday.  Please note that voicemails left after 4:00 p.m. may not be returned  until the following business day.  We are closed weekends and all major holidays.  You do have access to a nurse 24-7, just call the main number to the clinic [phone] and do not press any options, hold on the line and a nurse will answer the phone.    For prescription refill requests, have your pharmacy contact our office and allow 72 hours.    Masks are no longer required in the cancer centers. If you would like for your care team to wear a mask while they are taking care of you, please let them know. You may have one support person who is at least 87 years old accompany you for your appointments.

## 2023-02-21 NOTE — Progress Notes (Signed)
[facility] [address], [place] [address]   Clinic Day:  02/21/2023  Referring physician: Elfredia Nevins, MD  Patient Care Team: Elfredia Nevins, MD as PCP - General (Internal Medicine) Kemper Durie, RN as [facility] Care Management Randa Evens, Burnett Kanaris, RN as Oncology Nurse Navigator (Medical Oncology) [name], MD as Medical Oncologist (Medical Oncology)   ASSESSMENT & PLAN:   Assessment:  1.  Right lung mass: - Presented to the ER with left rib pain on 12/28/2022 from a mechanical fall. - CT chest without contrast (12/28/2022): 2.6 x 1.8 cm spiculated mass in the right lung suspicious for bronchogenic carcinoma.  This appears to cross the minor fissure and involve both upper and middle lobes.  Ill-defined nodularity in the superior segment of the right lower lobe nonspecific, potentially inflammatory.  Old left-sided rib fractures. - No fevers, night sweats or weight loss.  No hemoptysis. - She had a history of total colectomy in 2019 for bleeding.  She has ileostomy. - PET scan (01/06/2023): Masslike consolidation with associated groundglass in the lateral segment of the right middle lobe, collectively measuring 2.2 x 4.5 cm.  Hypermetabolic 6 mm right parotid nodule.  Vague 8 mm nodule in the right lower lobe too small for PET resolution.  Left adrenal adenoma. - Bronchoscopy and biopsy by Dr. Delton Coombes - Pathology (02/14/2023): RUL FNA atypical cells.  RLL FNA and brushing with atypical cells.  Lymph node 4L and lymph node 7 FNA with no malignant cells.  2.  Social/family history: - Lives by herself.  She is accompanied by her nephew Dwayne today.  She is using cane since her fall.  She is independent of ADLs and IADLs.  She also fixes dinner to her brother daily who lives close by.  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  She is current active smoker, smokes less than 1 pack/day for 70 years. - Mother and sister had breast  cancers.   Plan:  1.  T2 N0 right lung cancer: - She has seen Dr. Delton Coombes and underwent bronchoscopy and biopsy. - No endobronchial lesions were seen.  Pathology reports showed atypical cells of the RUL mass. - We will review her case at the tumor board this week. - We will make further recommendations based on it.   No orders of the defined types were placed in this encounter.   I,Alexis Herring,acting as a Neurosurgeon for [employer], MD.,have documented all relevant documentation on the behalf of [name], MD,as directed by  [name], MD while in the presence of [name], MD.  I, [name] MD, have reviewed the above documentation for accuracy and completeness, and I agree with the above.    [name], MD   4/8/20244:42 PM  CHIEF COMPLAINT/PURPOSE OF CONSULT:   Diagnosis: right lung mass   Cancer Staging  Bronchogenic cancer of right lung Staging form: Lung, AJCC 8th Edition - Clinical stage from 01/16/2023: Stage IIA (cT2b, cN0, cM0) - Unsigned    Prior Therapy: None  Current Therapy: pending work-up  HISTORY OF PRESENT ILLNESS:   Oncology History   No history exists.    INTERVAL HISTORY:   Kathleen Pope is a 89 y.o. female presenting to clinic today for follow up for right lung mass. She was last seen by me on 01/03/23.  Patient had a bronchoscopy with biopsies on 02/14/23 with Dr. Levy Pupa. Per Dr. Kavin Leech telephone note from 4/3, he spoke with patient's niece to discuss  her pathology results in which all of the RUL samples showed atypical cells and did not provide a full diagnosis. She states that she was spitting up "pellets" of blood after surgery but this has significantly improved since. Today, she states that she is doing okay overall. Her cough has been stable. Her appetite level is at 100%. Her energy level is at 75%.  PAST MEDICAL HISTORY:   Past Medical History: Past Medical History:  Diagnosis Date    Anemia    pt denies, yet takes iron   Chronic airway obstruction, not elsewhere classified    Complication of anesthesia    COPD (chronic obstructive pulmonary disease)    Diverticulosis    GI hemorrhage from post-treatment cecal ulcer 08/18/2012   Hemorrhoids    HTN (hypertension)    Hypercholesteremia    Lower GI bleed multiple   AVM's   PONV (postoperative nausea and vomiting)     Surgical History: Past Surgical History:  Procedure Laterality Date   BRONCHIAL BIOPSY  02/14/2023   Procedure: BRONCHIAL BIOPSIES;  Surgeon: Leslye Peer, MD;  Location: [facility] ENDOSCOPY;  Service: Pulmonary;;   BRONCHIAL BRUSHINGS  02/14/2023   Procedure: BRONCHIAL BRUSHINGS;  Surgeon: Leslye Peer, MD;  [facility]COPY;  Service: Pulmonary;;   BRONCHIAL NEEDLE ASPIRATION BIOPSY  02/14/2023   Procedure: BRONCHIAL NEEDLE ASPIRATION BIOPSIES;  Surgeon: Leslye Peer, MD;  Location: [facility] ENDOSCOPY;  Service: Pulmonary;;   BRONCHIAL WASHINGS  02/14/2023   Procedure: BRONCHIAL WASHINGS;  Surgeon: Leslye Peer, MD;  Location: [facility] ENDOSCOPY;  Service: Pulmonary;;   BUNIONECTOMY     left   COLECTOMY     COLON RESECTION N/A 01/17/2021   Procedure: EXPLORATORY LAPAROTOMY COMPLETION COLECTOMY ileostomy;  Surgeon: Axel Filler, MD;  Location: [facility] ORS;  Service: General;  Laterality: N/A;   COLONOSCOPY  08/10/2012   Procedure: COLONOSCOPY;  Surgeon: Meryl Dare, MD,FACG;  Location: [facility] ENDOSCOPY;  Service: Endoscopy;  Laterality: N/A;   COLONOSCOPY  08/19/2012   Procedure: COLONOSCOPY;  Surgeon: Iva Boop, MD;  Location: [facility] ENDOSCOPY;  Service: Endoscopy;  Laterality: N/A;   COLONOSCOPY N/A 03/11/2013   Procedure: COLONOSCOPY;  Surgeon: Hilarie Fredrickson, MD;  Location: [facility] ENDOSCOPY;  Service: Endoscopy;  Laterality: N/A;   COLONOSCOPY N/A 01/16/2021   Procedure: COLONOSCOPY;  Surgeon: Sherrilyn Rist, MD;  Location: [facility] ENDOSCOPY;  Service: Gastroenterology;  Laterality: N/A;   COLONOSCOPY WITH PROPOFOL  N/A 01/10/2021   Procedure: COLONOSCOPY WITH PROPOFOL;  Surgeon: Hilarie Fredrickson, MD;  Location: [facility] ENDOSCOPY;  Service: Endoscopy;  Laterality: N/A;   COLOSTOMY N/A 01/15/2021   Procedure: COLOSTOMY;  Surgeon: Harriette Bouillon, MD;  Location: [facility] ORS;  Service: General;  Laterality: N/A;   ESOPHAGOGASTRODUODENOSCOPY (EGD) WITH PROPOFOL N/A 01/14/2021   Procedure: ESOPHAGOGASTRODUODENOSCOPY (EGD) WITH PROPOFOL;  Surgeon: Sherrilyn Rist, MD;  Location: [facility] ENDOSCOPY;  Service: Endoscopy;  Laterality: N/A;   FIDUCIAL MARKER PLACEMENT  02/14/2023   Procedure: FIDUCIAL MARKER PLACEMENT;  Surgeon: Leslye Peer, MD;  Location: [facility] ENDOSCOPY;  Service: Pulmonary;;   FLEXIBLE SIGMOIDOSCOPY N/A 01/14/2021   Procedure: Arnell Sieving;  Surgeon: Sherrilyn Rist, MD;  Location: [facility] ENDOSCOPY;  Service: Endoscopy;  Laterality: N/A;   HOT HEMOSTASIS N/A 01/10/2021   Procedure: HOT HEMOSTASIS (ARGON PLASMA COAGULATION/BICAP);  Surgeon: Hilarie Fredrickson, MD;  Location: [facility] ENDOSCOPY;  Service: Endoscopy;  Laterality: N/A;   IR ANGIOGRAM SELECTIVE EACH ADDITIONAL VESSEL  01/12/2021   IR ANGIOGRAM SELECTIVE EACH ADDITIONAL VESSEL  01/12/2021   IR ANGIOGRAM SELECTIVE EACH ADDITIONAL VESSEL  01/12/2021   IR ANGIOGRAM VISCERAL SELECTIVE  01/09/2021   IR ANGIOGRAM VISCERAL SELECTIVE  01/12/2021   IR EMBO ARTERIAL NOT HEMORR HEMANG INC GUIDE ROADMAPPING  01/12/2021   IR US GUIDE VASC ACCESS RIGHT  01/09/2021   IR US GUIDE VASC ACCESS RIGHT  01/12/2021   PARTIAL COLECTOMY N/A 01/15/2021   Procedure: PARTIAL COLECTOMY;  Surgeon: Harriette Bouillon, MD;  Location: [facility] ORS;  Service: General;  Laterality: N/A;   POLYPECTOMY  01/10/2021   Procedure: POLYPECTOMY;  Surgeon: Hilarie Fredrickson, MD;  Location: [facility] ENDOSCOPY;  Service: Endoscopy;;   TONSILLECTOMY     VIDEO BRONCHOSCOPY WITH ENDOBRONCHIAL ULTRASOUND Bilateral 02/14/2023   Procedure: VIDEO BRONCHOSCOPY WITH ENDOBRONCHIAL ULTRASOUND;  Surgeon: Leslye Peer, MD;  Location: [facility]  ENDOSCOPY;  Service: Pulmonary;  Laterality: Bilateral;   VIDEO BRONCHOSCOPY WITH RADIAL ENDOBRONCHIAL ULTRASOUND  02/14/2023   Procedure: VIDEO BRONCHOSCOPY WITH RADIAL ENDOBRONCHIAL ULTRASOUND;  Surgeon: Leslye Peer, MD;  Location: [facility] ENDOSCOPY;  Service: Pulmonary;;    Social History: Social History   Socioeconomic History   Marital status: Widowed    Spouse name: Not on file   Number of children: 1   Years of education: Not on file   Highest education level: Not on file  Occupational History   Occupation: retired    Associate Professor: RETIRED  Tobacco Use   Smoking status: Every Day    Packs/day: 1.00    Years: 59.00    Additional pack years: 0.00    Total pack years: 59.00    Types: Cigarettes   Smokeless tobacco: Never   Tobacco comments:    Started smoking at age 25  Vaping Use   Vaping Use: Never used  Substance and Sexual Activity   Alcohol use: No   Drug use: No   Sexual activity: Never  Other Topics Concern   Not on file  Social History Narrative   Not on file   Social Determinants of Health   Financial Resource Strain: Not on file  Food Insecurity: No Food Insecurity (01/03/2023)   Hunger Vital Sign    Worried About Running Out of Food in the Last Year: Never true    Ran Out of Food in the Last Year: Never true  Transportation Needs: No Transportation Needs (01/03/2023)   PRAPARE - Administrator, Civil Service (Medical): No    Lack of Transportation (Non-Medical): No  Physical Activity: Not on file  Stress: Not on file  Social Connections: Not on file  Intimate Partner Violence: Not At Risk (01/03/2023)   Humiliation, Afraid, Rape, and Kick questionnaire    Fear of Current or Ex-Partner: No    Emotionally Abused: No    Physically Abused: No    Sexually Abused: No    Family History: Family History  Problem Relation Age of Onset   Breast cancer Mother    Lung cancer Father    Breast cancer Sister    Anuerysm Brother        in stomach    Colon cancer Neg Hx    Esophageal cancer Neg Hx    Rectal cancer Neg Hx     Current Medications:  Current Outpatient Medications:    acetaminophen (TYLENOL) 500 MG tablet, Take 2 tablets (1,000 mg total) by mouth every 6 (six) hours as needed for moderate pain or mild pain., Disp: , Rfl:    amLODipine (NORVASC) 5 MG tablet, Take 5 mg by mouth  daily., Disp: , Rfl:    Calcium Carbonate Antacid (CALCIUM CARBONATE PO), Take 500 mg by mouth daily., Disp: , Rfl:    cephALEXin (KEFLEX) 500 MG capsule, Take 500 mg by mouth 2 (two) times daily., Disp: , Rfl:    ECHINACEA PO, Take 167 mg by mouth daily., Disp: , Rfl:    Ferrous Sulfate 27 MG TABS, Take 54 mg by mouth daily with breakfast., Disp: , Rfl:    fluticasone-salmeterol (ADVAIR) 100-50 MCG/ACT AEPB, Inhale 1 puff into the lungs 2 (two) times daily., Disp: 60 each, Rfl: 5   ibuprofen (ADVIL) 200 MG tablet, Take 400 mg by mouth every 6 (six) hours as needed for moderate pain or mild pain., Disp: , Rfl:    metoprolol tartrate (LOPRESSOR) 25 MG tablet, Take 0.5 tablets (12.5 mg total) by mouth 2 (two) times daily., Disp: 60 tablet, Rfl: 1   Multiple Vitamins-Minerals (MULTIVITAMIN WITH MINERALS) tablet, Take 1 tablet by mouth daily. Centrum Silver, Disp: , Rfl:    neomycin-polymyxin-pramoxine (NEOSPORIN PLUS) 1 % cream, Apply 1 Application topically daily., Disp: , Rfl:    Omega-3 Fatty Acids (FISH OIL) 1000 MG CAPS, Take 1,000 Units by mouth daily., Disp: , Rfl:    Potassium 99 MG TABS, Take by mouth., Disp: , Rfl:    pravastatin (PRAVACHOL) 10 MG tablet, Take 10 mg by mouth daily., Disp: , Rfl:    furosemide (LASIX) 20 MG tablet, Take 1 tablet (20 mg total) by mouth daily as needed for fluid or edema. Take for weight gain of 3lbs or more in 24 hours or 5lbs or more over 7 days. Also for swelling or shortness of breath., Disp: 30 tablet, Rfl: 2   Allergies: No Known Allergies  REVIEW OF SYSTEMS:   Review of Systems  Constitutional:  Negative  for chills, fatigue and fever.  HENT:   Negative for lump/mass, mouth sores, nosebleeds, sore throat and trouble swallowing.   Eyes:  Negative for eye problems.  Respiratory:  Positive for cough. Negative for shortness of breath.   Cardiovascular:  Negative for chest pain, leg swelling and palpitations.  Gastrointestinal:  Negative for abdominal pain, constipation, diarrhea, nausea and vomiting.  Genitourinary:  Negative for bladder incontinence, difficulty urinating, dysuria, frequency, hematuria and nocturia.   Musculoskeletal:  Negative for arthralgias, back pain, flank pain, myalgias and neck pain.  Skin:  Negative for itching and rash.  Neurological:  Negative for dizziness, headaches and numbness.  Hematological:  Does not bruise/bleed easily.  Psychiatric/Behavioral:  Negative for depression, sleep disturbance and suicidal ideas. The patient is not nervous/anxious.   All other systems reviewed and are negative.    VITALS:   Blood pressure 135/68, pulse 71, temperature 98.2 F (36.8 C), temperature source Oral, resp. rate 18, height 5' 1.5" (1.562 m), weight 95 lb 11.2 oz (43.4 kg), SpO2 98 %.  Wt Readings from Last 3 Encounters:  02/21/23 95 lb 11.2 oz (43.4 kg)  02/14/23 92 lb (41.7 kg)  02/03/23 90 lb 6.4 oz (41 kg)    Body mass index is 17.79 kg/m.  Performance status (ECOG): 1 - Symptomatic but completely ambulatory  PHYSICAL EXAM:   Physical Exam Vitals and nursing note reviewed. Exam conducted with a chaperone present.  Constitutional:      Appearance: Normal appearance.  Cardiovascular:     Rate and Rhythm: Normal rate and regular rhythm.     Pulses: Normal pulses.     Heart sounds: Normal heart sounds.  Pulmonary:  Effort: Pulmonary effort is normal.     Breath sounds: Normal breath sounds.  Abdominal:     Palpations: Abdomen is soft. There is no hepatomegaly, splenomegaly or mass.     Tenderness: There is no abdominal tenderness.  Musculoskeletal:      Right lower leg: No edema.     Left lower leg: No edema.  Lymphadenopathy:     Cervical: No cervical adenopathy.     Right cervical: No superficial, deep or posterior cervical adenopathy.    Left cervical: No superficial, deep or posterior cervical adenopathy.     Upper Body:     Right upper body: No supraclavicular or axillary adenopathy.     Left upper body: No supraclavicular or axillary adenopathy.  Neurological:     General: No focal deficit present.     Mental Status: She is alert and oriented to person, place, and time.  Psychiatric:        Mood and Affect: Mood normal.        Behavior: Behavior normal.     LABS:      Latest Ref Rng & Units 02/14/2023   10:10 AM 02/14/2023    9:43 AM 05/13/2022    3:52 AM  CBC  WBC 4.0 - 10.5 K/uL  8.4  12.4   Hemoglobin 12.0 - 15.0 g/dL 25.7  50.5  18.3   Hematocrit 36.0 - 46.0 % 38.0  37.9  33.6   Platelets 150 - 400 K/uL  306  376       Latest Ref Rng & Units 02/14/2023   10:10 AM 05/13/2022    3:52 AM 05/12/2022   12:27 PM  CMP  Glucose 70 - 99 mg/dL 358  89    BUN 8 - 23 mg/dL 11  26    Creatinine 2.51 - 1.00 mg/dL 8.98  4.21  0.31   Sodium 135 - 145 mmol/L 141  137    Potassium 3.5 - 5.1 mmol/L 3.5  4.3    Chloride 98 - 111 mmol/L 102  105    CO2 22 - 32 mmol/L  25    Calcium 8.9 - 10.3 mg/dL  9.2       No results found for: "CEA1", "CEA" / No results found for: "CEA1", "CEA" No results found for: "PSA1" No results found for: "YOF188" No results found for: "CAN125"  No results found for: "TOTALPROTELP", "ALBUMINELP", "A1GS", "A2GS", "BETS", "BETA2SER", "GAMS", "MSPIKE", "SPEI" No results found for: "TIBC", "FERRITIN", "IRONPCTSAT" No results found for: "LDH"  Pathology 02/14/23 FINAL MICROSCOPIC DIAGNOSIS:  A. LUNG, RUL, BRUSH:  - Atypical cells.  - Not sufficient for diagnosis of malignancy.  - See comment.   B. LUNG, RUL, FINE NEEDLE ASPIRATION:  - Atypical cells.  - Not sufficient for diagnosis of malignancy.    C. LUNG, RLL, BRUSH:  - Nondiagnostic material   D. LUNG, RLL, FINE NEEDLE ASPIRATION:  - Atypical cells.  - Not sufficient for diagnosis of malignancy.   E. LUNG, RLL, LAVAGE:  FINAL MICROSCOPIC DIAGNOSIS:  - No malignant cells identified   FINAL MICROSCOPIC DIAGNOSIS:  F. LYMPH NODE, 4L, FINE NEEDLE ASPIRATION:  - No malignant cells identified   G. LYMPH NODE, 7, FINE NEEDLE ASPIRATION:  - No malignant cells identified  - Lymphoid tissue present    STUDIES:   DG Chest Port 1 View  Result Date: 02/14/2023 CLINICAL DATA:  Status post bronchoscopy with biopsy. EXAM: PORTABLE CHEST 1 VIEW COMPARISON:  Chest radiograph 05/12/2022.  Chest CT  12/28/2022. FINDINGS: Expected post procedural changes from recent biopsy of the known right middle lobe mass. No new airspace opacity. Normal heart size. Stable mediastinal contours. No pleural effusion or pneumothorax. IMPRESSION: Expected post procedural changes from recent biopsy the known right middle lobe mass. No pneumothorax. Electronically Signed   By: Orvan Falconer M.D.   On: 02/14/2023 13:05   DG C-ARM BRONCHOSCOPY  Result Date: 02/14/2023 C-ARM BRONCHOSCOPY: Fluoroscopy was utilized by the requesting physician.  No radiographic interpretation.   DG C-Arm 1-60 Min-No Report  Result Date: 02/14/2023 Fluoroscopy was utilized by the requesting physician.  No radiographic interpretation.

## 2023-02-24 ENCOUNTER — Telehealth: Payer: Self-pay | Admitting: Emergency Medicine

## 2023-02-24 ENCOUNTER — Telehealth: Payer: Self-pay | Admitting: Radiation Oncology

## 2023-02-24 ENCOUNTER — Other Ambulatory Visit: Payer: Self-pay | Admitting: *Deleted

## 2023-02-24 NOTE — Telephone Encounter (Signed)
4/11 @ 11:03 am Called and spoke to patient to be schedule for consult with Dr. Mitzi Hansen.  Patient decline consult at this time till after her appointment with Dr. Ellin Saba on 4/17.  She stated will call back.

## 2023-02-24 NOTE — Progress Notes (Signed)
The proposed treatment discussed in conference is for discussion purpose only and is not a binding recommendation.  The patients have not been physically examined, or presented with their treatment options.  Therefore, final treatment plans cannot be decided.  

## 2023-02-24 NOTE — Telephone Encounter (Signed)
Discussed patient in Thoracic conference. Consensus was that we should refer to Rad Onc to consider SBRT to RUL lesion, follow the small RLL nodule w serial imaging.

## 2023-02-25 NOTE — Telephone Encounter (Signed)
Called the patient and also her niece to discuss.  Left voicemail with her niece Purnell Shoemaker.  Will have to try them back.

## 2023-02-28 ENCOUNTER — Telehealth: Payer: Medicare Other | Admitting: Hematology

## 2023-03-01 NOTE — Telephone Encounter (Signed)
I reviewed the thoracic conference discussion with Kathleen Pope by phone.  She understands that the recommendation is that we refer her to radiation oncology for dedicated SBRT to the right upper lobe lesion, follow the right lower lobe nodule with serial imaging.  She wants to think about this, speak with her niece and consider her options.  She will call me back if/when she is ready to be referred to Rad Onc.

## 2023-03-02 ENCOUNTER — Encounter: Payer: Self-pay | Admitting: Hematology

## 2023-03-02 ENCOUNTER — Inpatient Hospital Stay (HOSPITAL_BASED_OUTPATIENT_CLINIC_OR_DEPARTMENT_OTHER): Payer: Medicare Other | Admitting: Hematology

## 2023-03-02 ENCOUNTER — Telehealth: Payer: Self-pay | Admitting: Emergency Medicine

## 2023-03-02 DIAGNOSIS — J984 Other disorders of lung: Secondary | ICD-10-CM | POA: Diagnosis not present

## 2023-03-02 DIAGNOSIS — F17210 Nicotine dependence, cigarettes, uncomplicated: Secondary | ICD-10-CM | POA: Diagnosis not present

## 2023-03-02 NOTE — Progress Notes (Signed)
Virtual Visit via Telephone Note  I connected with Kathleen Pope on 03/02/23 at  4:00 PM EDT by telephone and verified that I am speaking with the correct person using two identifiers.  Location: Patient: home Provider: clinic   I discussed the limitations, risks, security and privacy concerns of performing an evaluation and management service by telephone and the availability of in person appointments. I also discussed with the patient that there may be a patient responsible charge related to this service. The patient expressed understanding and agreed to proceed.   History of Present Illness: Kathleen Pope is a 89 y.o. female presenting to clinic today for follow up for right lung mass. She was last seen by me on 02/21/23.    Observations/Objective: Today, she states that she is doing well overall. Her appetite level is at 100%. Her energy level is at 100%.    Assessment and Plan: Assessment: 1.  Right lung mass: - Presented to the ER with left rib pain on 12/28/2022 from a mechanical fall. - CT chest without contrast (12/28/2022): 2.6 x 1.8 cm spiculated mass in the right lung suspicious for bronchogenic carcinoma.  This appears to cross the minor fissure and involve both upper and middle lobes.  Ill-defined nodularity in the superior segment of the right lower lobe nonspecific, potentially inflammatory.  Old left-sided rib fractures. - No fevers, night sweats or weight loss.  No hemoptysis. - She had a history of total colectomy in 2019 for bleeding.  She has ileostomy. - PET scan (01/06/2023): Masslike consolidation with associated groundglass in the lateral segment of the right middle lobe, collectively measuring 2.2 x 4.5 cm.  Hypermetabolic 6 mm right parotid nodule.  Vague 8 mm nodule in the right lower lobe too small for PET resolution.  Left adrenal adenoma. - Bronchoscopy and biopsy by Dr. Delton Coombes - Pathology (02/14/2023): RUL FNA atypical cells.  RLL FNA and brushing with atypical cells.   Lymph node 4L and lymph node 7 FNA with no malignant cells.   2.  Social/family history: - Lives by herself.  She is accompanied by her nephew Kathleen Pope today.  She is using cane since her fall.  She is independent of ADLs and IADLs.  She also fixes dinner to her brother daily who lives close by.  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  She is current active smoker, smokes less than 1 pack/day for 70 years. - Mother and sister had breast cancers.     Plan: 1.  T2 N0 right lung cancer: - Her case was discussed at tumor board. - Agree with SBRT of the right middle lobe lesion. - She has an appointment with Dr. Mitzi Hansen on Friday. - We will continue to monitor 8 mm nodule in the right lower lobe which was not FDG avid. - We will schedule CT chest in 4 months. - Also discussed with patient's nephew Kathleen Pope.    Follow Up Instructions: RTC 4 months with CT chest.  I discussed the assessment and treatment plan with the patient. The patient was provided an opportunity to ask questions and all were answered. The patient agreed with the plan and demonstrated an understanding of the instructions.   The patient was advised to call back or seek an in-person evaluation if the symptoms worsen or if the condition fails to improve as anticipated.  I provided 21 minutes of non-face-to-face time during this encounter.   I,Alexis Herring,acting as a Neurosurgeon for [employer], MD.,have documented all relevant  documentation on the behalf of [name], MD,as directed by  [name], MD while in the presence of [name], MD.  I, [name] MD, have reviewed the above documentation for accuracy and completeness, and I agree with the above.

## 2023-03-02 NOTE — Telephone Encounter (Signed)
Pt called the office stating that she was going to be meeting with her oncologist Friday 4/19 to discuss treatment for the lung cancer.  Pt wanted to make Dr. Delton Coombes aware. Routing to Dr. Delton Coombes as an Lorain Childes.

## 2023-03-02 NOTE — Telephone Encounter (Signed)
Thank you :)

## 2023-03-03 ENCOUNTER — Other Ambulatory Visit: Payer: Self-pay | Admitting: *Deleted

## 2023-03-03 ENCOUNTER — Other Ambulatory Visit: Payer: Self-pay

## 2023-03-03 DIAGNOSIS — J984 Other disorders of lung: Secondary | ICD-10-CM

## 2023-03-03 DIAGNOSIS — C3490 Malignant neoplasm of unspecified part of unspecified bronchus or lung: Secondary | ICD-10-CM

## 2023-03-03 NOTE — Progress Notes (Unsigned)
[facility] please order CT chest with contrast in 4 months for this patient. This is to follow-up of right lung mass after radiation therapy. No labs needed.

## 2023-03-03 NOTE — Progress Notes (Signed)
Thoracic Location of Tumor / Histology: Right Lung  PET 01/06/2023: Masslike consolidation with associated groundglass in the lateral segment of the right middle lobe, collectively measuring 2.2 x 4.5 cm. Hypermetabolic 6 mm right parotid nodule. Vague 8 mm nodule in the right lower lobe too small for PET resolution. Left adrenal adenoma.   CT Chest 12/28/2022: 2.6 x 1.8 cm spiculated mass in the right lung suspicious for bronchogenic carcinoma. This appears to cross the minor fissure and involve both upper and middle lobes. Ill-defined nodularity in the superior segment of the right lower lobe nonspecific, potentially inflammatory. Old left-sided rib fractures.   Biopsies of RUL/RLL Lung 02/14/2023      Tobacco/Marijuana/Snuff/ETOH use: Current Smoker  Past/Anticipated interventions by cardiothoracic surgery, if any:   Past/Anticipated interventions by medical oncology, if any:  Dr. Ellin Saba 03/02/2023   T2 N0 right lung cancer: - Her case was discussed at tumor board. - Agree with SBRT of the right middle lobe lesion. - She has an appointment with Dr. Mitzi Hansen on Friday. - We will continue to monitor 8 mm nodule in the right lower lobe which was not FDG avid. - We will schedule CT chest in 4 months.   Signs/Symptoms Weight changes, if any: No Respiratory complaints, if any: Denies SOB. Hemoptysis, if any: She reports productive cough with clear/yellowish mucus.  She reports a smear of blood in her sputum since biopsy. Pain issues, if any: Denies chest pain, pressure, or tightness.   SAFETY ISSUES: Prior radiation? No Pacemaker/ICD?  No Possible current pregnancy? Postmenopausal Is the patient on methotrexate? No  Current Complaints / other details:

## 2023-03-03 NOTE — Progress Notes (Signed)
Radiation Oncology         [phone] ________________________________  Name: Kathleen Pope        MRN: [id number]  Date of Service: 03/04/2023 DOB: 06/03/1933  CC:Elfredia Nevins, MD  Leslye Peer, MD     REFERRING PHYSICIAN: Leslye Peer, MD   DIAGNOSIS: The encounter diagnosis was Primary malignant neoplasm of right middle lobe of lung.   HISTORY OF PRESENT ILLNESS: Kathleen Pope is a 89 y.o. female seen at the request of Dr. Delton Coombes for a newly diagnosed lung cancer. She originally presented to the ER with left rib pain on 12/28/22 from a mechanical fall. Chest CT performed that day showed a 2.6 cm x 1.8 spiculated mass in the right lung suspicious for bronchogenic carcinoma that appeared to cross the minor fissure and involve both the upper and middle lobes. Further work up was recommended. An ill-defined nodularity in the superior segment of the right lower lobe was also visualized, it was thought to be potentially inflammatory. An old left-sided rib fracture was also seen, but no acute osseous findings were identified.   Patient underwent PET on 01/07/23 for further workup. Imaging revealed a hypermetabolic subsolid mass in the right middle lobe; a hypermetabolic 6 mm right parotid nodule; vague 8 mm nodule in the right lower lobe; trace left pleural fluid; left adrenal adenoma; aortic atherosclerosis; and emphysema.   MRI of the brain on 01/14/23 was negative for metastatic disease.   Patient underwent bronchoscopy and biopsy on 02/14/23 under the care of Dr. Delton Coombes. Pathology from the RUL FNA showed atypical cells. The pathology from the RLL FNA and burshing also showed atypical cells. Pathology of the lymph nodes 4L and 7 showed no malignant cells.  Patient is currently smoking 3/4 ppd for 70 years. She is not interested in quitting at this time.   She was kindly referred to [place] today to discuss radiation treatment options.   PREVIOUS RADIATION THERAPY: No   PAST MEDICAL HISTORY:   Past Medical History:  Diagnosis Date   Anemia    pt denies, yet takes iron   Chronic airway obstruction, not elsewhere classified    Complication of anesthesia    COPD (chronic obstructive pulmonary disease)    Diverticulosis    GI hemorrhage from post-treatment cecal ulcer 08/18/2012   Hemorrhoids    HTN (hypertension)    Hypercholesteremia    Lower GI bleed multiple   AVM's   Lung cancer 02/14/2023   PONV (postoperative nausea and vomiting)        PAST SURGICAL HISTORY: Past Surgical History:  Procedure Laterality Date   BRONCHIAL BIOPSY  02/14/2023   Procedure: BRONCHIAL BIOPSIES;  Surgeon: Leslye Peer, MD;  Location: [facility] ENDOS[facility];  Service: Pulmonary;;   BRONCHIAL BRUSHINGS  02/14/2023   Procedure: BRONCHIAL BRUSHINGS;  Surgeon: Leslye Peer, MD;  Location: [facility] ENDOSCOPY;  Service: Pulmonary;;   BRONCHIAL NEEDLE ASPIRATION BIOPSY  02/14/2023   Procedure: BRONCHIAL NEEDLE ASPIRATION BIOPSIES;  Surgeon: Leslye Peer, MD;  Location: [facility]OSCOPY;  Service: Pulmonary;;   BRONCHIAL WASHINGS  02/14/2023   Procedure: BRONCHIAL WASHINGS;  Surgeon: Leslye Peer, MD;  Location[facility] END[facility]COPY;  Service: Pulmonary;;   BUNIONECTOMY     left   COLECTOMY     COLON RESECTION N/A 01/17/2021   Procedure: EXPLORATORY LAPAROTOMY COMPLETION COLECTOMY ileostomy;  Surgeon: Axel Filler, MD;  Location: [facility] ORS;  Service: General;  Laterality: N/A;   COLONOSCOPY  08/10/2012   Procedure: COLONOSCOPY;  Surgeon: Meryl Dare, MD,FACG;  Location: [facility];  Service: Endoscopy;  Laterality: N/A;   COLONOSCOPY  08/19/2012   Procedure: COLONOSCOPY;  Surgeon: Iva Boop, MD;  Location: [facility];  Service: Endoscopy;  Laterality: N/A;   COLONOSCOPY N/A 03/11/2013   Procedure: COLONOSCOPY;  Surgeon: Hilarie Fredrickson, MD;  Location: [facility];  Service: Endoscopy;  Laterality: N/A;   COLONOSCOPY N/A 01/16/2021   Procedure: COLONOSCOPY;  Surgeon: Sherrilyn Rist, MD;   Location: [facility];  Service: Gastroenterology;  Laterality: N/A;   COLONOSCOPY WITH PROPOFOL N/A 01/10/2021   Procedure: COLONOSCOPY WITH PROPOFOL;  Surgeon: Hilarie Fredrickson, MD;  Location: [facility];  Service: Endoscopy;  Laterality: N/A;   COLOSTOMY N/A 01/15/2021   Procedure: COLOSTOMY;  Surgeon: Harriette Bouillon, MD;  Location: [facility];  Service: General;  Laterality: N/A;   ESOPHAGOGASTRODUODENOSCOPY (EGD) WITH PROPOFOL N/A 01/14/2021   Procedure: ESOPHAGOGASTRODUODENOSCOPY (EGD) WITH PROPOFOL;  Surgeon: Sherrilyn Rist, MD;  Location: [facility];  Service: Endoscopy;  Laterality: N/A;   FIDUCIAL MARKER PLACEMENT  02/14/2023   Procedure: FIDUCIAL MARKER PLACEMENT;  Surgeon: Leslye Peer, MD;  Location: [facility] ENDOSCOPY;  Service: Pulmonary;;   FLEXIBLE SIGMOIDOSCOPY N/A 01/14/2021   Procedure: Arnell Sieving;  Surgeon: Sherrilyn Rist, MD;  Location: [facility];  Service: Endoscopy;  Laterality: N/A;   HOT HEMOSTASIS N/A 01/10/2021   Procedure: HOT HEMOSTASIS (ARGON PLASMA COAGULATION/BICAP);  Surgeon: Hilarie Fredrickson, MD;  Location: [facility];  Service: Endoscopy;  Laterality: N/A;   IR ANGIOGRAM SELECTIVE EACH ADDITIONAL VESSEL  01/12/2021   IR ANGIOGRAM SELECTIVE EACH ADDITIONAL VESSEL  01/12/2021   IR ANGIOGRAM SELECTIVE EACH ADDITIONAL VESSEL  01/12/2021   IR ANGIOGRAM VISCERAL SELECTIVE  01/09/2021   IR ANGIOGRAM VISCERAL SELECTIVE  01/12/2021   IR EMBO ARTERIAL NOT HEMORR HEMANG INC GUIDE ROADMAPPING  01/12/2021   IR US GUIDE VASC ACCESS RIGHT  01/09/2021   IR US GUIDE VASC ACCESS RIGHT  01/12/2021   PARTIAL COLECTOMY N/A 01/15/2021   Procedure: PARTIAL COLECTOMY;  Surgeon: Harriette Bouillon, MD;  Location: [facility];  Service: General;  Laterality: N/A;   POLYPECTOMY  01/10/2021   Procedure: POLYPECTOMY;  Surgeon: Hilarie Fredrickson, MD;  Location: [facility];  Service: Endoscopy;;   SKIN CANCER EXCISION Right 12/2022   Right Leg above knee   TONSILLECTOMY      VIDEO BRONCHOSCOPY WITH ENDOBRONCHIAL ULTRASOUND Bilateral 02/14/2023   Procedure: VIDEO BRONCHOSCOPY WITH ENDOBRONCHIAL ULTRASOUND;  Surgeon: Leslye Peer, MD;  Location: [facility] ENDOSCOPY;  Service: Pulmonary;  Laterality: Bilateral;   VIDEO BRONCHOSCOPY WITH RADIAL ENDOBRONCHIAL ULTRASOUND  02/14/2023   Procedure: VIDEO BRONCHOSCOPY WITH RADIAL ENDOBRONCHIAL ULTRASOUND;  Surgeon: Leslye Peer, MD;  Location: [facility] ENDOS[facility];  Service: Pulmonary;;     FAMILY HISTORY:  Family History  Problem Relation Age of Onset   Breast cancer Mother    Lung cancer Father    Breast cancer Sister    Anuerysm Brother        in stomach   Colon cancer Neg Hx    Esophageal cancer Neg Hx    Rectal cancer Neg Hx      SOCIAL HISTORY:  reports that she has been smoking cigarettes. She has a 59.00 pack-year smoking history. She has never used smokeless tobacco. She reports that she does not drink alcohol and does not use drugs.   ALLERGIES: Patient has no known allergies.   MEDICATIONS:  Current Outpatient Medications  Medication Sig Dispense Refill  acetaminophen (TYLENOL) 500 MG tablet Take 2 tablets (1,000 mg total) by mouth every 6 (six) hours as needed for moderate pain or mild pain.     amLODipine (NORVASC) 5 MG tablet Take 5 mg by mouth daily.     Calcium Carbonate Antacid (CALCIUM CARBONATE PO) Take 500 mg by mouth daily.     cephALEXin (KEFLEX) 500 MG capsule Take 500 mg by mouth 2 (two) times daily.     ECHINACEA PO Take 167 mg by mouth daily.     Ferrous Sulfate 27 MG TABS Take 54 mg by mouth daily with breakfast.     fluticasone-salmeterol (ADVAIR) 100-50 MCG/ACT AEPB Inhale 1 puff into the lungs 2 (two) times daily. 60 each 5   ibuprofen (ADVIL) 200 MG tablet Take 400 mg by mouth every 6 (six) hours as needed for moderate pain or mild pain.     metoprolol tartrate (LOPRESSOR) 25 MG tablet Take 0.5 tablets (12.5 mg total) by mouth 2 (two) times daily. 60 tablet 1   Multiple  Vitamins-Minerals (MULTIVITAMIN WITH MINERALS) tablet Take 1 tablet by mouth daily. Centrum Silver     neomycin-polymyxin-pramoxine (NEOSPORIN PLUS) 1 % cream Apply 1 Application topically daily.     Omega-3 Fatty Acids (FISH OIL) 1000 MG CAPS Take 1,000 Units by mouth daily.     Potassium 99 MG TABS Take by mouth.     pravastatin (PRAVACHOL) 10 MG tablet Take 10 mg by mouth daily.     No current facility-administered medications for this encounter.     REVIEW OF SYSTEMS: On review of systems, the patient reports that she is doing well overall. She denies any shortness of breath at this time. She reports a productive cough with clear/yellowish mucus. She occasionally notices blood tinged sputum. She denies any chest pain, pressure, or tightness. She denies any weight changes.      PHYSICAL EXAM:  Wt Readings from Last 3 Encounters:  03/04/23 92 lb 6.4 oz (41.9 kg)  02/21/23 95 lb 11.2 oz (43.4 kg)  02/14/23 92 lb (41.7 kg)   Temp Readings from Last 3 Encounters:  03/04/23 98.7 F (37.1 C)  02/21/23 98.2 F (36.8 C) (Oral)  02/14/23 97.7 F (36.5 C)   BP Readings from Last 3 Encounters:  03/04/23 (!) 146/70  02/21/23 135/68  02/14/23 109/65   Pulse Readings from Last 3 Encounters:  03/04/23 80  02/21/23 71  02/14/23 86   Pain Assessment Pain Score: 0-No pain/10  In general this is a well appearing female in no acute distress. She's alert and oriented x4 and appropriate throughout the examination. Cardiopulmonary assessment is negative for acute distress and she exhibits normal effort.     ECOG = 0  0 - Asymptomatic (Fully active, able to carry on all predisease activities without restriction)  1 - Symptomatic but completely ambulatory (Restricted in physically strenuous activity but ambulatory and able to carry out work of a light or sedentary nature. For example, light housework, office work)  2 - Symptomatic, <50% in bed during the day (Ambulatory and capable of  all self care but unable to carry out any work activities. Up and about more than 50% of waking hours)  3 - Symptomatic, >50% in bed, but not bedbound (Capable of only limited self-care, confined to bed or chair 50% or more of waking hours)  4 - Bedbound (Completely disabled. Cannot carry on any self-care. Totally confined to bed or chair)  5 - Death   Myrna Blazer [facility],  [place], et al. [phone]). "Toxicity and response criteria of the [facility]". Am. Evlyn Clines. Oncol. 5 (6): 649-55    LABORATORY DATA:  Lab Results  Component Value Date   WBC 8.4 02/14/2023   HGB 12.9 02/14/2023   HCT 38.0 02/14/2023   MCV 93.3 02/14/2023   PLT 306 02/14/2023   Lab Results  Component Value Date   NA 141 02/14/2023   K 3.5 02/14/2023   CL 102 02/14/2023   CO2 25 05/13/2022   Lab Results  Component Value Date   ALT 13 05/12/2022   AST 21 05/12/2022   ALKPHOS 62 05/12/2022   BILITOT 0.8 05/12/2022      RADIOGRAPHY: DG Chest Port 1 View  Result Date: 02/14/2023 CLINICAL DATA:  Status post bronchoscopy with biopsy. EXAM: PORTABLE CHEST 1 VIEW COMPARISON:  Chest radiograph 05/12/2022.  Chest CT 12/28/2022. FINDINGS: Expected post procedural changes from recent biopsy of the known right middle lobe mass. No new airspace opacity. Normal heart size. Stable mediastinal contours. No pleural effusion or pneumothorax. IMPRESSION: Expected post procedural changes from recent biopsy the known right middle lobe mass. No pneumothorax. Electronically Signed   By: Orvan Falconer M.D.   On: 02/14/2023 13:05   DG C-ARM BRONCHOSCOPY  Result Date: 02/14/2023 C-ARM BRONCHOSCOPY: Fluoroscopy was utilized by the requesting physician.  No radiographic interpretation.   DG C-Arm 1-60 Min-No Report  Result Date: 02/14/2023 Fluoroscopy was utilized by the requesting physician.  No radiographic interpretation.       IMPRESSION/PLAN: 1. T2 N0 M0 right lung cancer  It was a pleasure meeting  the patient today. Patient's case was discussed in our tumor board and it was agreed that she would be a good candidate for SBRT to the right middle lobe lesion. We will continue to monitor the 8 mm nodule in the right lower lobe that was not FDG avid.  Today we reviewed the pathology and the nature of early stage lung cancer. Dr. Mitzi Hansen agrees that 3-5 fractions of stereotactic body radiation therapy (SBRT) is a good treatment option for this patient. We discussed the role of SBRT in treatment of early stage lung cancer. We reviewed the benefits, risks, and possible side effects. Side effects include, but are not limited to, fatigue, skin irritation, esophagitis, discomfort to the area treated, or lung scarring. Patient expressed understanding of the treatment which is of curative intent. All questions were answered to patient's satisfaction. A consent form was signed and placed in patient's chart.   2. Hypermetabolic parotid nodule on PET  Recent PET showed hypermetabolic 6 mm parotid nodule. Given her history, we will refer to ENT for further evaluation.   In a visit lasting 45 minutes, greater than 50% of the time was spent face to face discussing the patient's condition, in preparation for the discussion, and coordinating the patient's care.   The above documentation reflects my direct findings during this shared patient visit. Please see the separate note by Dr. Mitzi Hansen on this date for the remainder of the patient's plan of care.    Joyice Faster, PA-C    **Disclaimer: This note was dictated with voice recognition software. Similar sounding words can inadvertently be transcribed and this note may contain transcription errors which may not have been corrected upon publication of note.**

## 2023-03-04 ENCOUNTER — Encounter: Payer: Self-pay | Admitting: Radiation Oncology

## 2023-03-04 ENCOUNTER — Ambulatory Visit
Admission: RE | Admit: 2023-03-04 | Discharge: 2023-03-04 | Disposition: A | Discharge status: Home / Self Care | Payer: Medicare Other | Source: Ambulatory Visit | Attending: Radiation Oncology | Admitting: Radiation Oncology

## 2023-03-04 VITALS — BP 146/70 | HR 80 | Temp 98.7°F | Resp 18 | Ht 61.0 in | Wt 92.4 lb

## 2023-03-04 DIAGNOSIS — Z79899 Other long term (current) drug therapy: Secondary | ICD-10-CM | POA: Diagnosis not present

## 2023-03-04 DIAGNOSIS — C342 Malignant neoplasm of middle lobe, bronchus or lung: Secondary | ICD-10-CM | POA: Insufficient documentation

## 2023-03-04 DIAGNOSIS — Z8719 Personal history of other diseases of the digestive system: Secondary | ICD-10-CM | POA: Insufficient documentation

## 2023-03-04 DIAGNOSIS — F17210 Nicotine dependence, cigarettes, uncomplicated: Secondary | ICD-10-CM | POA: Diagnosis not present

## 2023-03-04 DIAGNOSIS — I10 Essential (primary) hypertension: Secondary | ICD-10-CM | POA: Insufficient documentation

## 2023-03-04 DIAGNOSIS — J449 Chronic obstructive pulmonary disease, unspecified: Secondary | ICD-10-CM | POA: Diagnosis not present

## 2023-03-04 DIAGNOSIS — Z801 Family history of malignant neoplasm of trachea, bronchus and lung: Secondary | ICD-10-CM | POA: Insufficient documentation

## 2023-03-04 DIAGNOSIS — Z803 Family history of malignant neoplasm of breast: Secondary | ICD-10-CM | POA: Insufficient documentation

## 2023-03-04 DIAGNOSIS — Z7951 Long term (current) use of inhaled steroids: Secondary | ICD-10-CM | POA: Diagnosis not present

## 2023-03-04 DIAGNOSIS — E7800 Pure hypercholesterolemia, unspecified: Secondary | ICD-10-CM | POA: Insufficient documentation

## 2023-03-04 DIAGNOSIS — D649 Anemia, unspecified: Secondary | ICD-10-CM | POA: Diagnosis not present

## 2023-03-07 ENCOUNTER — Other Ambulatory Visit: Payer: Self-pay | Admitting: Radiation Oncology

## 2023-03-07 DIAGNOSIS — C342 Malignant neoplasm of middle lobe, bronchus or lung: Secondary | ICD-10-CM

## 2023-03-08 DIAGNOSIS — F17210 Nicotine dependence, cigarettes, uncomplicated: Secondary | ICD-10-CM | POA: Diagnosis not present

## 2023-03-08 DIAGNOSIS — C342 Malignant neoplasm of middle lobe, bronchus or lung: Secondary | ICD-10-CM | POA: Diagnosis not present

## 2023-03-09 ENCOUNTER — Telehealth: Payer: Self-pay | Admitting: *Deleted

## 2023-03-09 NOTE — Telephone Encounter (Signed)
CALLED PATIENT TO INFORM OF APPT. WITH DR. Vickii Chafe  OF [facility]- MAY 2 - ARRIVAL TIME- 2:15 PM, SPOKE WITH PATIENT AND SHE IS AWARE OF THIS APPT.

## 2023-03-11 ENCOUNTER — Encounter (HOSPITAL_BASED_OUTPATIENT_CLINIC_OR_DEPARTMENT_OTHER): Payer: Medicare Other

## 2023-03-11 ENCOUNTER — Ambulatory Visit (HOSPITAL_BASED_OUTPATIENT_CLINIC_OR_DEPARTMENT_OTHER): Payer: Medicare Other | Admitting: Pulmonary Disease

## 2023-03-17 ENCOUNTER — Telehealth: Payer: Self-pay | Admitting: Radiation Oncology

## 2023-03-17 NOTE — Telephone Encounter (Signed)
I called the patient because she had concerns about her ENT referral. We discussed the rationale for her referral to investigate a right parotid nodule that was hypermetabolic on PET. I encouraged her to go for that visit if she could but to reschedule if she needed and that was an important visit.

## 2023-03-18 ENCOUNTER — Ambulatory Visit
Admission: RE | Admit: 2023-03-18 | Discharge: 2023-03-18 | Disposition: A | Discharge status: Home / Self Care | Payer: Medicare Other | Source: Ambulatory Visit | Attending: Radiation Oncology | Admitting: Radiation Oncology

## 2023-03-18 DIAGNOSIS — C342 Malignant neoplasm of middle lobe, bronchus or lung: Secondary | ICD-10-CM | POA: Insufficient documentation

## 2023-03-18 DIAGNOSIS — F17210 Nicotine dependence, cigarettes, uncomplicated: Secondary | ICD-10-CM | POA: Diagnosis not present

## 2023-03-28 DIAGNOSIS — K119 Disease of salivary gland, unspecified: Secondary | ICD-10-CM | POA: Diagnosis not present

## 2023-03-29 DIAGNOSIS — F17210 Nicotine dependence, cigarettes, uncomplicated: Secondary | ICD-10-CM | POA: Diagnosis not present

## 2023-03-29 DIAGNOSIS — C342 Malignant neoplasm of middle lobe, bronchus or lung: Secondary | ICD-10-CM | POA: Diagnosis not present

## 2023-03-31 ENCOUNTER — Ambulatory Visit
Admission: RE | Admit: 2023-03-31 | Discharge: 2023-03-31 | Disposition: A | Discharge status: Home / Self Care | Payer: Medicare Other | Source: Ambulatory Visit | Attending: Radiation Oncology | Admitting: Radiation Oncology

## 2023-03-31 ENCOUNTER — Other Ambulatory Visit: Payer: Self-pay

## 2023-03-31 DIAGNOSIS — C342 Malignant neoplasm of middle lobe, bronchus or lung: Secondary | ICD-10-CM | POA: Diagnosis not present

## 2023-03-31 LAB — RAD ONC ARIA SESSION SUMMARY
Course Elapsed Days: 0
Plan Fractions Treated to Date: 1
Plan Prescribed Dose Per Fraction: 12 Gy
Plan Total Fractions Prescribed: 5
Plan Total Prescribed Dose: 60 Gy
Reference Point Dosage Given to Date: 12 Gy
Reference Point Session Dosage Given: 12 Gy
Session Number: 1

## 2023-04-01 ENCOUNTER — Encounter: Payer: Self-pay | Admitting: General Practice

## 2023-04-01 ENCOUNTER — Other Ambulatory Visit (HOSPITAL_COMMUNITY): Payer: Self-pay | Admitting: Otolaryngology

## 2023-04-01 ENCOUNTER — Ambulatory Visit: Payer: Medicare Other | Admitting: Radiation Oncology

## 2023-04-01 DIAGNOSIS — K119 Disease of salivary gland, unspecified: Secondary | ICD-10-CM

## 2023-04-01 NOTE — Progress Notes (Signed)
Kathleen Balm, MD  Caroleen Hamman, NT PROCEDURE / BIOPSY REVIEW Date: 04/01/23  Requested Biopsy site: R parotid nodule superficial  Reason for request: exists PET+ Imaging review: Best seen on PET 01/06/23 Im 57 Se 607 faint   Decision: Approved Imaging modality to perform: Ultrasound Schedule with: Patient preference (Local vs Mod Sed) Schedule for: Any VIR  Additional comments:   Please contact me with questions, concerns, or if issue pertaining to this request arise.  Dayne Kathleen Balm, MD [facility]       Previous Messages    ----- Message ----- From: Caroleen Hamman, NT Sent: 04/01/2023  11:23 AM EDT To: Ir Procedure Requests Subject: [place] CORE BIOPSY (SALIVARY GLAND/PAROTID GLAND)  Procedure: [place] CORE BIOPSY (SALIVARY GLAND/PAROTID GLAND)  Reason: Lesion of parotid gland  right  History: PET in chart  Provider: Laren Boom, DO  Contact: [phone]

## 2023-04-04 ENCOUNTER — Other Ambulatory Visit: Payer: Self-pay

## 2023-04-04 ENCOUNTER — Ambulatory Visit
Admission: RE | Admit: 2023-04-04 | Discharge: 2023-04-04 | Disposition: A | Discharge status: Home / Self Care | Payer: Medicare Other | Source: Ambulatory Visit | Attending: Radiation Oncology | Admitting: Radiation Oncology

## 2023-04-04 DIAGNOSIS — C342 Malignant neoplasm of middle lobe, bronchus or lung: Secondary | ICD-10-CM | POA: Diagnosis not present

## 2023-04-04 LAB — RAD ONC ARIA SESSION SUMMARY
Course Elapsed Days: 4
Plan Fractions Treated to Date: 2
Plan Prescribed Dose Per Fraction: 12 Gy
Plan Total Fractions Prescribed: 5
Plan Total Prescribed Dose: 60 Gy
Reference Point Dosage Given to Date: 24 Gy
Reference Point Session Dosage Given: 12 Gy
Session Number: 2

## 2023-04-06 ENCOUNTER — Ambulatory Visit
Admission: RE | Admit: 2023-04-06 | Discharge: 2023-04-06 | Disposition: A | Discharge status: Home / Self Care | Payer: Medicare Other | Source: Ambulatory Visit | Attending: Radiation Oncology | Admitting: Radiation Oncology

## 2023-04-06 ENCOUNTER — Other Ambulatory Visit: Payer: Self-pay

## 2023-04-06 DIAGNOSIS — C342 Malignant neoplasm of middle lobe, bronchus or lung: Secondary | ICD-10-CM | POA: Diagnosis not present

## 2023-04-06 LAB — RAD ONC ARIA SESSION SUMMARY
Course Elapsed Days: 6
Plan Fractions Treated to Date: 3
Plan Prescribed Dose Per Fraction: 12 Gy
Plan Total Fractions Prescribed: 5
Plan Total Prescribed Dose: 60 Gy
Reference Point Dosage Given to Date: 36 Gy
Reference Point Session Dosage Given: 12 Gy
Session Number: 3

## 2023-04-08 ENCOUNTER — Ambulatory Visit
Admission: RE | Admit: 2023-04-08 | Discharge: 2023-04-08 | Disposition: A | Discharge status: Home / Self Care | Payer: Medicare Other | Source: Ambulatory Visit | Attending: Radiation Oncology | Admitting: Radiation Oncology

## 2023-04-08 ENCOUNTER — Other Ambulatory Visit: Payer: Self-pay

## 2023-04-08 DIAGNOSIS — C342 Malignant neoplasm of middle lobe, bronchus or lung: Secondary | ICD-10-CM | POA: Diagnosis not present

## 2023-04-08 LAB — RAD ONC ARIA SESSION SUMMARY
Course Elapsed Days: 8
Plan Fractions Treated to Date: 4
Plan Prescribed Dose Per Fraction: 12 Gy
Plan Total Fractions Prescribed: 5
Plan Total Prescribed Dose: 60 Gy
Reference Point Dosage Given to Date: 48 Gy
Reference Point Session Dosage Given: 12 Gy
Session Number: 4

## 2023-04-12 ENCOUNTER — Ambulatory Visit
Admission: RE | Admit: 2023-04-12 | Discharge: 2023-04-12 | Disposition: A | Discharge status: Home / Self Care | Payer: Medicare Other | Source: Ambulatory Visit | Attending: Radiation Oncology | Admitting: Radiation Oncology

## 2023-04-12 ENCOUNTER — Other Ambulatory Visit: Payer: Self-pay

## 2023-04-12 DIAGNOSIS — C342 Malignant neoplasm of middle lobe, bronchus or lung: Secondary | ICD-10-CM

## 2023-04-12 DIAGNOSIS — Z510 Encounter for antineoplastic radiation therapy: Secondary | ICD-10-CM | POA: Diagnosis not present

## 2023-04-12 DIAGNOSIS — F17210 Nicotine dependence, cigarettes, uncomplicated: Secondary | ICD-10-CM | POA: Diagnosis not present

## 2023-04-12 LAB — RAD ONC ARIA SESSION SUMMARY
Course Elapsed Days: 12
Plan Fractions Treated to Date: 5
Plan Prescribed Dose Per Fraction: 12 Gy
Plan Total Fractions Prescribed: 5
Plan Total Prescribed Dose: 60 Gy
Reference Point Dosage Given to Date: 60 Gy
Reference Point Session Dosage Given: 12 Gy
Session Number: 5

## 2023-04-15 ENCOUNTER — Ambulatory Visit (HOSPITAL_COMMUNITY)
Admission: RE | Admit: 2023-04-15 | Discharge: 2023-04-15 | Disposition: A | Discharge status: Home / Self Care | Payer: Medicare Other | Source: Ambulatory Visit | Attending: Otolaryngology | Admitting: Otolaryngology

## 2023-04-15 DIAGNOSIS — K118 Other diseases of salivary glands: Secondary | ICD-10-CM | POA: Insufficient documentation

## 2023-04-15 DIAGNOSIS — K119 Disease of salivary gland, unspecified: Secondary | ICD-10-CM

## 2023-04-15 MED ORDER — LIDOCAINE HCL 1 % IJ SOLN
10.0000 mL | Freq: Once | INTRAMUSCULAR | Status: DC
Start: 2023-04-15 — End: 2023-04-15

## 2023-04-15 MED ORDER — LIDOCAINE HCL (PF) 1 % IJ SOLN
5.0000 mL | Freq: Once | INTRAMUSCULAR | Status: AC
  Administered 2023-04-15: 5 mL via INTRADERMAL

## 2023-04-15 NOTE — Procedures (Signed)
  Procedure:  [place] FNA R parotid nodule   Preprocedure diagnosis: The encounter diagnosis was Lesion of parotid gland. Postprocedure diagnosis: same EBL:    minimal Complications:   none immediate  See full dictation in [employer].  Thora Lance MD Main # [phone] Pager  [phone] Mobile [phone]

## 2023-04-18 LAB — CYTOLOGY - NON PAP

## 2023-04-19 NOTE — Radiation Completion Notes (Addendum)
  Radiation Oncology         (336) [phone] ________________________________  Name: Kathleen Pope MRN: [id number]  Date of Service: 04/12/2023  DOB: 08/18/1933  End of Treatment Note  Diagnosis: Putative Stage IIA, cT2N0M0, NSCLC, atypical cells on cytology of the RML  Intent: Curative     ==========DELIVERED PLANS==========  First Treatment Date: 2023-03-31 - Last Treatment Date: 2023-04-12   Plan Name: Lung_R_SBRT Site: Lung, Right Technique: SBRT/SRT-IMRT Mode: Photon Dose Per Fraction: 12 Gy Prescribed Dose (Delivered / Prescribed): 60 Gy / 60 Gy Prescribed Fxs (Delivered / Prescribed): 5 / 5     ==========ON TREATMENT VISIT DATES========== 2023-03-31, 2023-04-04, 2023-04-06, 2023-04-08, 2023-04-08, 2023-04-12    See weekly On Treatment Notes in Epic for details. The patient tolerated radiation. She developed fatigue and anticipated skin changes in the treatment field.   The patient will receive a call in about one month from the radiation oncology department. I've ordered a CT chest in 6-8 weeks time for baseline imaging. She will then continue in surveillance at 6 month intervals per NCCN guidelines. 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     Osker Mason, PAC

## 2023-04-22 ENCOUNTER — Other Ambulatory Visit: Payer: Self-pay | Admitting: Radiation Oncology

## 2023-04-22 DIAGNOSIS — C342 Malignant neoplasm of middle lobe, bronchus or lung: Secondary | ICD-10-CM

## 2023-05-02 ENCOUNTER — Other Ambulatory Visit: Payer: Self-pay | Admitting: Otolaryngology

## 2023-05-02 DIAGNOSIS — K119 Disease of salivary gland, unspecified: Secondary | ICD-10-CM

## 2023-05-24 ENCOUNTER — Other Ambulatory Visit: Payer: Medicare Other

## 2023-06-06 ENCOUNTER — Ambulatory Visit
Admission: RE | Admit: 2023-06-06 | Discharge: 2023-06-06 | Disposition: A | Discharge status: Home / Self Care | Payer: Medicare Other | Source: Ambulatory Visit | Attending: Radiation Oncology | Admitting: Radiation Oncology

## 2023-06-06 NOTE — Progress Notes (Signed)
  Radiation Oncology         (336) [phone] ________________________________  Name: Kathleen Pope MRN: [id number]  Date of Service: 06/06/2023  DOB: [birth year]  Post Treatment Telephone Note  Diagnosis:  Putative Stage IIA, cT2N0M0, NSCLC, atypical cells on cytology of the RML (as documented in provider EOT note)   The patient was available for call today.   Symptoms of fatigue have improved since completing therapy.  Symptoms of skin changes have improved since completing therapy.  Symptoms of esophagitis have improved since completing therapy.   The patient has scheduled follow up with her medical oncologist Dr. Ellin Saba for ongoing care, and was encouraged to call if she develops concerns or questions regarding radiation.   This concludes the interaction.  Ruel Favors, LPN

## 2023-06-20 ENCOUNTER — Encounter: Payer: Self-pay | Admitting: Radiation Oncology

## 2023-06-20 NOTE — Progress Notes (Signed)
Noted pt has CT scheduled with F/U with med onc. I will cancel my original order for CT given her scheduled appt with Dr. Ellin Saba. We would be happy to participate in her care as needed moving forward.

## 2023-06-22 ENCOUNTER — Telehealth: Payer: Self-pay | Admitting: *Deleted

## 2023-06-22 NOTE — Telephone Encounter (Signed)
Called patient to inform her that Jill Side has cancelled her appt. with her, and I informed her that Dr. Arbutus Ped and Wilford Sports will see her and go over her scan and that they will follow her long term and that we will not see her, unless they re-refer her to [place]., patient verified understanding this

## 2023-06-24 ENCOUNTER — Other Ambulatory Visit: Payer: Self-pay

## 2023-06-24 DIAGNOSIS — J984 Other disorders of lung: Secondary | ICD-10-CM

## 2023-06-27 ENCOUNTER — Ambulatory Visit (HOSPITAL_COMMUNITY)
Admission: RE | Admit: 2023-06-27 | Discharge: 2023-06-27 | Disposition: A | Discharge status: Home / Self Care | Payer: Medicare Other | Source: Ambulatory Visit | Attending: Hematology | Admitting: Hematology

## 2023-06-27 ENCOUNTER — Inpatient Hospital Stay: Payer: Medicare Other | Attending: Hematology

## 2023-06-27 DIAGNOSIS — J984 Other disorders of lung: Secondary | ICD-10-CM | POA: Insufficient documentation

## 2023-06-27 DIAGNOSIS — C3490 Malignant neoplasm of unspecified part of unspecified bronchus or lung: Secondary | ICD-10-CM | POA: Insufficient documentation

## 2023-06-27 DIAGNOSIS — J439 Emphysema, unspecified: Secondary | ICD-10-CM | POA: Diagnosis not present

## 2023-06-27 DIAGNOSIS — Z923 Personal history of irradiation: Secondary | ICD-10-CM | POA: Insufficient documentation

## 2023-06-27 DIAGNOSIS — I700 Atherosclerosis of aorta: Secondary | ICD-10-CM | POA: Diagnosis not present

## 2023-06-27 DIAGNOSIS — F17210 Nicotine dependence, cigarettes, uncomplicated: Secondary | ICD-10-CM | POA: Insufficient documentation

## 2023-06-27 DIAGNOSIS — C3491 Malignant neoplasm of unspecified part of right bronchus or lung: Secondary | ICD-10-CM | POA: Diagnosis not present

## 2023-06-27 DIAGNOSIS — C3411 Malignant neoplasm of upper lobe, right bronchus or lung: Secondary | ICD-10-CM | POA: Insufficient documentation

## 2023-06-27 LAB — CBC WITH DIFFERENTIAL/PLATELET
Abs Immature Granulocytes: 0.02 10*3/uL (ref 0.00–0.07)
Basophils Absolute: 0 10*3/uL (ref 0.0–0.1)
Basophils Relative: 0 %
Eosinophils Absolute: 0.2 10*3/uL (ref 0.0–0.5)
Eosinophils Relative: 4 %
HCT: 38.6 % (ref 36.0–46.0)
Hemoglobin: 13.2 g/dL (ref 12.0–15.0)
Immature Granulocytes: 0 %
Lymphocytes Relative: 21 %
Lymphs Abs: 1.3 10*3/uL (ref 0.7–4.0)
MCH: 33 pg (ref 26.0–34.0)
MCHC: 34.2 g/dL (ref 30.0–36.0)
MCV: 96.5 fL (ref 80.0–100.0)
Monocytes Absolute: 0.5 10*3/uL (ref 0.1–1.0)
Monocytes Relative: 9 %
Neutro Abs: 3.8 10*3/uL (ref 1.7–7.7)
Neutrophils Relative %: 66 %
Platelets: 276 10*3/uL (ref 150–400)
RBC: 4 MIL/uL (ref 3.87–5.11)
RDW: 14.3 % (ref 11.5–15.5)
WBC: 5.9 10*3/uL (ref 4.0–10.5)
nRBC: 0 % (ref 0.0–0.2)

## 2023-06-27 LAB — COMPREHENSIVE METABOLIC PANEL
ALT: 16 U/L (ref 0–44)
AST: 23 U/L (ref 15–41)
Albumin: 3.7 g/dL (ref 3.5–5.0)
Alkaline Phosphatase: 71 U/L (ref 38–126)
Anion gap: 10 (ref 5–15)
BUN: 13 mg/dL (ref 8–23)
CO2: 25 mmol/L (ref 22–32)
Calcium: 9.1 mg/dL (ref 8.9–10.3)
Chloride: 99 mmol/L (ref 98–111)
Creatinine, Ser: 0.79 mg/dL (ref 0.44–1.00)
GFR, Estimated: >60 mL/min (ref >60)
Glucose, Bld: 170 mg/dL — ABNORMAL HIGH (ref 70–99)
Potassium: 3.7 mmol/L (ref 3.5–5.1)
Sodium: 134 mmol/L — ABNORMAL LOW (ref 135–145)
Total Bilirubin: 0.6 mg/dL (ref 0.3–1.2)
Total Protein: 6.9 g/dL (ref 6.5–8.1)

## 2023-06-27 MED ORDER — IOHEXOL 300 MG/ML  SOLN
75.0000 mL | Freq: Once | INTRAMUSCULAR | Status: AC | PRN
  Administered 2023-06-27: 75 mL via INTRAVENOUS

## 2023-07-04 ENCOUNTER — Ambulatory Visit: Payer: Medicare Other | Admitting: Radiation Oncology

## 2023-07-05 ENCOUNTER — Inpatient Hospital Stay: Payer: Medicare Other | Admitting: Hematology

## 2023-07-05 VITALS — BP 162/77 | HR 70 | Temp 98.0°F | Resp 18 | Ht 61.5 in | Wt 93.5 lb

## 2023-07-05 DIAGNOSIS — C3490 Malignant neoplasm of unspecified part of unspecified bronchus or lung: Secondary | ICD-10-CM

## 2023-07-05 DIAGNOSIS — R918 Other nonspecific abnormal finding of lung field: Secondary | ICD-10-CM | POA: Diagnosis not present

## 2023-07-05 DIAGNOSIS — Z923 Personal history of irradiation: Secondary | ICD-10-CM | POA: Diagnosis not present

## 2023-07-05 DIAGNOSIS — C3411 Malignant neoplasm of upper lobe, right bronchus or lung: Secondary | ICD-10-CM | POA: Diagnosis not present

## 2023-07-05 DIAGNOSIS — F17210 Nicotine dependence, cigarettes, uncomplicated: Secondary | ICD-10-CM | POA: Diagnosis not present

## 2023-07-05 NOTE — Progress Notes (Signed)
[facility] [address] [place] [address]   Clinic Day:  07/05/2023  Referring physician: Elfredia Nevins, MD  Patient Care Team: Elfredia Nevins, MD as PCP - General (Internal Medicine) Kemper Durie, RN as [facility] Care Management Randa Evens, Burnett Kanaris, RN as Oncology Nurse Navigator (Medical Oncology) [name], MD as Medical Oncologist (Medical Oncology)   ASSESSMENT & PLAN:   Assessment:  1.  T2 N0 right lung cancer: - Presented to the ER with left rib pain on 12/28/2022 from a mechanical fall. - CT chest without contrast (12/28/2022): 2.6 x 1.8 cm spiculated mass in the right lung suspicious for bronchogenic carcinoma.  This appears to cross the minor fissure and involve both upper and middle lobes.  Ill-defined nodularity in the superior segment of the right lower lobe nonspecific, potentially inflammatory.  Old left-sided rib fractures. - No fevers, night sweats or weight loss.  No hemoptysis. - She had a history of total colectomy in 2019 for bleeding.  She has ileostomy. - PET scan (01/06/2023): Masslike consolidation with associated groundglass in the lateral segment of the right middle lobe, collectively measuring 2.2 x 4.5 cm.  Hypermetabolic 6 mm right parotid nodule.  Vague 8 mm nodule in the right lower lobe too small for PET resolution.  Left adrenal adenoma. - Bronchoscopy and biopsy by Dr. Delton Coombes - Pathology (02/14/2023): RUL FNA atypical cells.  RLL FNA and brushing with atypical cells.  Lymph node 4L and lymph node 7 FNA with no malignant cells. - Right lung SBRT, 60 Gray in 5 fractions from 03/31/2023 through 04/12/2023  2.  Social/family history: - Lives by herself.  She is accompanied by her nephew Dwayne today.  She is using cane since her fall.  She is independent of ADLs and IADLs.  She also fixes dinner to her brother daily who lives close by.  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  She is current active  smoker, smokes less than 1 pack/day for 70 years. - Mother and sister had breast cancers.   Plan:  1.  T2 N0 right lung cancer: - She underwent SBRT of the right lung with 5 fractions completed in end of May. - She did not have any significant side effects from radiation. - Reviewed CT chest from 06/27/2023: Improving right lung nodule with surrounding radiation changes.  No evidence of metastatic disease. - Reviewed labs from 06/27/2023: Grossly normal. - RTC 6 months for follow-up with repeat CT imaging.   Orders Placed This Encounter  Procedures   CT CHEST W CONTRAST    Standing Status:   Future    Standing Expiration Date:   07/04/2024    Order Specific Question:   If indicated for the ordered procedure, I authorize the administration of contrast media per Radiology protocol    Answer:   Yes    Order Specific Question:   Does the patient have a contrast media/X-ray dye allergy?    Answer:   No    Order Specific Question:   Preferred imaging location?    Answer:   [facility]    Order Specific Question:   Release to patient    Answer:   Immediate [1]    Order Specific Question:   Release to patient    Answer:   Immediate   CBC with Differential/Platelet    Standing Status:   Future    Standing Expiration Date:   07/04/2024    Order Specific Question:  Release to patient    Answer:   Immediate   Comprehensive metabolic panel    Standing Status:   Future    Standing Expiration Date:   07/04/2024    Order Specific Question:   Release to patient    Answer:   Immediate     I,Helena R Teague,acting as a scribe for [name], MD.,have documented all relevant documentation on the behalf of [name], MD,as directed by  [name], MD while in the presence of [name], MD.  I, [name] MD, have reviewed the above documentation for accuracy and completeness, and I agree with the above.     [name], MD    8/20/20246:18 PM  CHIEF COMPLAINT/PURPOSE OF CONSULT:   Diagnosis: right lung mass   Cancer Staging  Bronchogenic cancer of right lung [facility]) Staging form: Lung, AJCC 8th Edition - Clinical stage from 01/16/2023: Stage IIA (cT2b, cN0, cM0) - Unsigned    Prior Therapy: None  Current Therapy: pending work-up  HISTORY OF PRESENT ILLNESS:   Oncology History   No history exists.    INTERVAL HISTORY:   Kathleen Pope is a 89 y.o. female presenting to clinic today for follow up for right lung mass. She was last seen by me on 03/02/23.  Since her last visit, she underwent CT chest on 06/27/23 that found: improving anterior right lung nodule centered in the right middle lobe; surrounding radiation changes, and no evidence of metastatic disease in the chest.   Today, she states that she is doing well overall. Her appetite level is at 100%. Her energy level is at 80%.  She c/o a cough with clear phlegm. She reports normal breathing. She had 5 radiation treatments at [facility] in May 2024 without any issues.   PAST MEDICAL HISTORY:   Past Medical History: Past Medical History:  Diagnosis Date   Anemia    pt denies, yet takes iron   Chronic airway obstruction, not elsewhere classified    Complication of anesthesia    COPD (chronic obstructive pulmonary disease) (HCC)    Diverticulosis    GI hemorrhage from post-treatment cecal ulcer 08/18/2012   Hemorrhoids    HTN (hypertension)    Hypercholesteremia    Lower GI bleed multiple   AVM's   Lung cancer (HCC) 02/14/2023   PONV (postoperative nausea and vomiting)     Surgical History: Past Surgical History:  Procedure Laterality Date   BRONCHIAL BIOPSY  02/14/2023   Procedure: BRONCHIAL BIOPSIES;  Surgeon: Leslye Peer, MD;  Location: [facility] ENDOSCOPY;  Service: Pulmonary;;   BRONCHIAL BRUSHINGS  02/14/2023   Procedure: BRONCHIAL BRUSHINGS;  Surgeon: Leslye Peer, MD;  Location: [facility] ENDOSCOPY;  Service: Pulmonary;;   BRONCHIAL NEEDLE  ASPIRATION BIOPSY  02/14/2023   Procedure: BRONCHIAL NEEDLE ASPIRATION BIOPSIES;  Surgeon: Leslye Peer, MD;  Location: [facility] ENDOSCOPY;  Service: Pulmonary;;   BRONCHIAL WASHINGS  02/14/2023   Procedure: BRONCHIAL WASHINGS;  Surgeon: Leslye Peer, MD;  Location: [facility] ENDOSCOPY;  Service: Pulmonary;;   BUNIONECTOMY     left   COLECTOMY     COLON RESECTION N/A 01/17/2021   Procedure: EXPLORATORY LAPAROTOMY COMPLETION COLECTOMY ileostomy;  Surgeon: Axel Filler, MD;  Location: [facility] ORS;  Service: General;  Laterality: N/A;   COLONOSCOPY  08/10/2012   Procedure: COLONOSCOPY;  Surgeon: Meryl Dare, MD,FACG;  Location: [facility] ENDOSCOPY;  Service: Endoscopy;  Laterality: N/A;   COLONOSCOPY  08/19/2012   Procedure: COLONOSCOPY;  Surgeon: Iva Boop, MD;  Location:  [facility] ENDOSCOPY;  Service: Endoscopy;  Laterality: N/A;   COLONOSCOPY N/A 03/11/2013   Procedure: COLONOSCOPY;  Surgeon: Hilarie Fredrickson, MD;  Location: [facility] ENDOSCOPY;  Service: Endoscopy;  Laterality: N/A;   COLONOSCOPY N/A 01/16/2021   Procedure: COLONOSCOPY;  Surgeon: Sherrilyn Rist, MD;  Location: [facility] ENDOSCOPY;  Service: Gastroenterology;  Laterality: N/A;   COLONOSCOPY WITH PROPOFOL N/A 01/10/2021   Procedure: COLONOSCOPY WITH PROPOFOL;  Surgeon: Hilarie Fredrickson, MD;  Location: [facility] ENDOSCOPY;  Service: Endoscopy;  Laterality: N/A;   COLOSTOMY N/A 01/15/2021   Procedure: COLOSTOMY;  Surgeon: Harriette Bouillon, MD;  Location: [facility] ORS;  Service: General;  Laterality: N/A;   ESOPHAGOGASTRODUODENOSCOPY (EGD) WITH PROPOFOL N/A 01/14/2021   Procedure: ESOPHAGOGASTRODUODENOSCOPY (EGD) WITH PROPOFOL;  Surgeon: Sherrilyn Rist, MD;  Location: [facility] ENDOSCOPY;  Service: Endoscopy;  Laterality: N/A;   FIDUCIAL MARKER PLACEMENT  02/14/2023   Procedure: FIDUCIAL MARKER PLACEMENT;  Surgeon: Leslye Peer, MD;  Location: [facility] ENDOSCOPY;  Service: Pulmonary;;   FLEXIBLE SIGMOIDOSCOPY N/A 01/14/2021   Procedure: Arnell Sieving;  Surgeon: Sherrilyn Rist, MD;  Location: [facility] ENDOSCOPY;  Service: Endoscopy;  Laterality: N/A;   HOT HEMOSTASIS N/A 01/10/2021   Procedure: HOT HEMOSTASIS (ARGON PLASMA COAGULATION/BICAP);  Surgeon: Hilarie Fredrickson, MD;  Location: [facility] ENDOSCOPY;  Service: Endoscopy;  Laterality: N/A;   IR ANGIOGRAM SELECTIVE EACH ADDITIONAL VESSEL  01/12/2021   IR ANGIOGRAM SELECTIVE EACH ADDITIONAL VESSEL  01/12/2021   IR ANGIOGRAM SELECTIVE EACH ADDITIONAL VESSEL  01/12/2021   IR ANGIOGRAM VISCERAL SELECTIVE  01/09/2021   IR ANGIOGRAM VISCERAL SELECTIVE  01/12/2021   IR EMBO ARTERIAL NOT HEMORR HEMANG INC GUIDE ROADMAPPING  01/12/2021   IR US GUIDE VASC ACCESS RIGHT  01/09/2021   IR US GUIDE VASC ACCESS RIGHT  01/12/2021   PARTIAL COLECTOMY N/A 01/15/2021   Procedure: PARTIAL COLECTOMY;  Surgeon: Harriette Bouillon, MD;  Location: [facility] ORS;  Service: General;  Laterality: N/A;   POLYPECTOMY  01/10/2021   Procedure: POLYPECTOMY;  Surgeon: Hilarie Fredrickson, MD;  Location: [facility] ENDOSCOPY;  Service: Endoscopy;;   SKIN CANCER EXCISION Right 12/2022   Right Leg above knee   TONSILLECTOMY     VIDEO BRONCHOSCOPY WITH ENDOBRONCHIAL ULTRASOUND Bilateral 02/14/2023   Procedure: VIDEO BRONCHOSCOPY WITH ENDOBRONCHIAL ULTRASOUND;  Surgeon: Leslye Peer, MD;  Location: [facility] ENDOSCOPY;  Service: Pulmonary;  Laterality: Bilateral;   VIDEO BRONCHOSCOPY WITH RADIAL ENDOBRONCHIAL ULTRASOUND  02/14/2023   Procedure: VIDEO BRONCHOSCOPY WITH RADIAL ENDOBRONCHIAL ULTRASOUND;  Surgeon: Leslye Peer, MD;  Location: [facility] ENDOSCOPY;  Service: Pulmonary;;    Social History: Social History   Socioeconomic History   Marital status: Widowed    Spouse name: Not on file   Number of children: 1   Years of education: Not on file   Highest education level: Not on file  Occupational History   Occupation: retired    Associate Professor: RETIRED  Tobacco Use   Smoking status: Every Day    Current packs/day: 1.00    Average packs/day: 1 pack/day for 59.0 years (59.0  ttl pk-yrs)    Types: Cigarettes   Smokeless tobacco: Never   Tobacco comments:    Started smoking at age 68  Vaping Use   Vaping status: Never Used  Substance and Sexual Activity   Alcohol use: No   Drug use: No   Sexual activity: Never  Other Topics Concern   Not on file  Social History Narrative   Not on file   Social Determinants of  Health   Financial Resource Strain: Not on file  Food Insecurity: No Food Insecurity (01/03/2023)   Hunger Vital Sign    Worried About Running Out of Food in the Last Year: Never true    Ran Out of Food in the Last Year: Never true  Transportation Needs: No Transportation Needs (01/03/2023)   PRAPARE - Administrator, Civil Service (Medical): No    Lack of Transportation (Non-Medical): No  Physical Activity: Not on file  Stress: Not on file  Social Connections: Not on file  Intimate Partner Violence: Not At Risk (01/03/2023)   Humiliation, Afraid, Rape, and Kick questionnaire    Fear of Current or Ex-Partner: No    Emotionally Abused: No    Physically Abused: No    Sexually Abused: No    Family History: Family History  Problem Relation Age of Onset   Breast cancer Mother    Lung cancer Father    Breast cancer Sister    Anuerysm Brother        in stomach   Colon cancer Neg Hx    Esophageal cancer Neg Hx    Rectal cancer Neg Hx     Current Medications:  Current Outpatient Medications:    acetaminophen (TYLENOL) 500 MG tablet, Take 2 tablets (1,000 mg total) by mouth every 6 (six) hours as needed for moderate pain or mild pain., Disp: , Rfl:    amLODipine (NORVASC) 5 MG tablet, Take 5 mg by mouth daily., Disp: , Rfl:    Calcium Carbonate Antacid (CALCIUM CARBONATE PO), Take 500 mg by mouth daily., Disp: , Rfl:    cephALEXin (KEFLEX) 500 MG capsule, Take 500 mg by mouth 2 (two) times daily., Disp: , Rfl:    ECHINACEA PO, Take 167 mg by mouth daily., Disp: , Rfl:    Ferrous Sulfate 27 MG TABS, Take 54 mg by mouth daily  with breakfast., Disp: , Rfl:    fluticasone-salmeterol (ADVAIR) 100-50 MCG/ACT AEPB, Inhale 1 puff into the lungs 2 (two) times daily., Disp: 60 each, Rfl: 5   ibuprofen (ADVIL) 200 MG tablet, Take 400 mg by mouth every 6 (six) hours as needed for moderate pain or mild pain., Disp: , Rfl:    metoprolol tartrate (LOPRESSOR) 25 MG tablet, Take 0.5 tablets (12.5 mg total) by mouth 2 (two) times daily., Disp: 60 tablet, Rfl: 1   Multiple Vitamins-Minerals (MULTIVITAMIN WITH MINERALS) tablet, Take 1 tablet by mouth daily. Centrum Silver, Disp: , Rfl:    neomycin-polymyxin-pramoxine (NEOSPORIN PLUS) 1 % cream, Apply 1 Application topically daily., Disp: , Rfl:    Omega-3 Fatty Acids (FISH OIL) 1000 MG CAPS, Take 1,000 Units by mouth daily., Disp: , Rfl:    Potassium 99 MG TABS, Take by mouth., Disp: , Rfl:    pravastatin (PRAVACHOL) 10 MG tablet, Take 10 mg by mouth daily., Disp: , Rfl:    losartan (COZAAR) 50 MG tablet, Take 50 mg by mouth daily., Disp: , Rfl:    Allergies: No Known Allergies  REVIEW OF SYSTEMS:   Review of Systems  Constitutional:  Negative for chills, fatigue and fever.  HENT:   Negative for lump/mass, mouth sores, nosebleeds, sore throat and trouble swallowing.   Eyes:  Negative for eye problems.  Respiratory:  Positive for cough. Negative for shortness of breath.   Cardiovascular:  Negative for chest pain, leg swelling and palpitations.  Gastrointestinal:  Negative for abdominal pain, constipation, diarrhea, nausea and vomiting.       +colostomy  bag  Genitourinary:  Negative for bladder incontinence, difficulty urinating, dysuria, frequency, hematuria and nocturia.   Musculoskeletal:  Negative for arthralgias, back pain, flank pain, myalgias and neck pain.  Skin:  Negative for itching and rash.  Neurological:  Negative for dizziness, headaches and numbness.  Hematological:  Does not bruise/bleed easily.  Psychiatric/Behavioral:  Negative for depression, sleep disturbance  and suicidal ideas. The patient is not nervous/anxious.   All other systems reviewed and are negative.    VITALS:   Blood pressure (!) 162/77, pulse 70, temperature 98 F (36.7 C), temperature source Oral, resp. rate 18, height 5' 1.5" (1.562 m), weight 93 lb 8 oz (42.4 kg), SpO2 100%.  Wt Readings from Last 3 Encounters:  07/05/23 93 lb 8 oz (42.4 kg)  03/04/23 92 lb 6.4 oz (41.9 kg)  02/21/23 95 lb 11.2 oz (43.4 kg)    Body mass index is 17.38 kg/m.  Performance status (ECOG): 1 - Symptomatic but completely ambulatory  PHYSICAL EXAM:   Physical Exam Vitals and nursing note reviewed. Exam conducted with a chaperone present.  Constitutional:      Appearance: Normal appearance.  Cardiovascular:     Rate and Rhythm: Normal rate and regular rhythm.     Pulses: Normal pulses.     Heart sounds: Normal heart sounds.  Pulmonary:     Effort: Pulmonary effort is normal.     Breath sounds: Normal breath sounds.  Abdominal:     Palpations: Abdomen is soft. There is no hepatomegaly, splenomegaly or mass.     Tenderness: There is no abdominal tenderness.  Musculoskeletal:     Right lower leg: No edema.     Left lower leg: No edema.  Lymphadenopathy:     Cervical: No cervical adenopathy.     Right cervical: No superficial, deep or posterior cervical adenopathy.    Left cervical: No superficial, deep or posterior cervical adenopathy.     Upper Body:     Right upper body: No supraclavicular or axillary adenopathy.     Left upper body: No supraclavicular or axillary adenopathy.  Neurological:     General: No focal deficit present.     Mental Status: She is alert and oriented to person, place, and time.  Psychiatric:        Mood and Affect: Mood normal.        Behavior: Behavior normal.     LABS:      Latest Ref Rng & Units 06/27/2023    2:03 PM 02/14/2023   10:10 AM 02/14/2023    9:43 AM  CBC  WBC 4.0 - 10.5 K/uL 5.9   8.4   Hemoglobin 12.0 - 15.0 g/dL 44.0  10.2  72.5    Hematocrit 36.0 - 46.0 % 38.6  38.0  37.9   Platelets 150 - 400 K/uL 276   306       Latest Ref Rng & Units 06/27/2023    2:03 PM 02/14/2023   10:10 AM 05/13/2022    3:52 AM  CMP  Glucose 70 - 99 mg/dL 366  440  89   BUN 8 - 23 mg/dL 13  11  26    Creatinine 0.44 - 1.00 mg/dL 3.47  4.25  9.56   Sodium 135 - 145 mmol/L 134  141  137   Potassium 3.5 - 5.1 mmol/L 3.7  3.5  4.3   Chloride 98 - 111 mmol/L 99  102  105   CO2 22 - 32 mmol/L 25   25  Calcium 8.9 - 10.3 mg/dL 9.1   9.2   Total Protein 6.5 - 8.1 g/dL 6.9     Total Bilirubin 0.3 - 1.2 mg/dL 0.6     Alkaline Phos 38 - 126 U/L 71     AST 15 - 41 U/L 23     ALT 0 - 44 U/L 16        No results found for: "CEA1", "CEA" / No results found for: "CEA1", "CEA" No results found for: "PSA1" No results found for: "FAO130" No results found for: "CAN125"  No results found for: "TOTALPROTELP", "ALBUMINELP", "A1GS", "A2GS", "BETS", "BETA2SER", "GAMS", "MSPIKE", "SPEI" No results found for: "TIBC", "FERRITIN", "IRONPCTSAT" No results found for: "LDH"  Pathology 02/14/23 FINAL MICROSCOPIC DIAGNOSIS:  A. LUNG, RUL, BRUSH:  - Atypical cells.  - Not sufficient for diagnosis of malignancy.  - See comment.   B. LUNG, RUL, FINE NEEDLE ASPIRATION:  - Atypical cells.  - Not sufficient for diagnosis of malignancy.   C. LUNG, RLL, BRUSH:  - Nondiagnostic material   D. LUNG, RLL, FINE NEEDLE ASPIRATION:  - Atypical cells.  - Not sufficient for diagnosis of malignancy.   E. LUNG, RLL, LAVAGE:  FINAL MICROSCOPIC DIAGNOSIS:  - No malignant cells identified   FINAL MICROSCOPIC DIAGNOSIS:  F. LYMPH NODE, 4L, FINE NEEDLE ASPIRATION:  - No malignant cells identified   G. LYMPH NODE, 7, FINE NEEDLE ASPIRATION:  - No malignant cells identified  - Lymphoid tissue present    STUDIES:   CT Chest W Contrast  Result Date: 07/02/2023 CLINICAL DATA:  Right lung cancer, status post radiation EXAM: CT CHEST WITH CONTRAST TECHNIQUE:  Multidetector CT imaging of the chest was performed during intravenous contrast administration. RADIATION DOSE REDUCTION: This exam was performed according to the departmental dose-optimization program which includes automated exposure control, adjustment of the mA and/or kV according to patient size and/or use of iterative reconstruction technique. CONTRAST:  75mL OMNIPAQUE IOHEXOL 300 MG/ML  SOLN COMPARISON:  PET-CT dated 01/04/2023.  CT chest dated 12/28/2022. FINDINGS: Cardiovascular: The heart is top-normal in size. No pericardial effusion. No evidence of thoracic aneurysm. Atherosclerotic calcifications of the aortic root/arch. Moderate three-vessel coronary atherosclerosis. Mediastinum/Nodes: 8 mm short axis low right paratracheal node, unchanged, non FDG avid on prior PET. Visualized thyroid is unremarkable. Lungs/Pleura: Cystic/solid nodule in the anterior right lung, straddling the fissure and favored to be predominantly in the right middle lobe, with dominant 1.4 x 2.3 cm solid component, previously 1.8 x 2.6 cm. Adjacent surgical clips/fiducial marker with surrounding radiation changes. Moderate centrilobular emphysematous changes, upper lung predominant. No focal consolidation.  Mild scarring in the right lower lung. No pleural effusion or pneumothorax. Upper Abdomen: Visualized upper abdomen is grossly unremarkable, noting vascular calcifications. Musculoskeletal: Mild degenerative changes of the visualized thoracolumbar spine. IMPRESSION: Improving anterior right lung nodule centered in the right middle lobe, as described above. Surrounding radiation changes. No evidence of metastatic disease in the chest. Aortic Atherosclerosis (ICD10-I70.0) and Emphysema (ICD10-J43.9). Electronically Signed   By: Charline Bills M.D.   On: 07/02/2023 01:15

## 2023-07-05 NOTE — Patient Instructions (Addendum)
[facility]  Discharge Instructions  You were seen and examined today by Dr. Ellin Saba.  Dr. Ellin Saba discussed your most recent lab work and CT scan which revealed that everything looks good and stable.  Dr. Ellin Saba will repeat scan and labs before your next appointment.  Follow-up as scheduled in 6 months.    Thank you for choosing [facility] to provide your oncology and hematology care.   To afford each patient quality time with our provider, please arrive at least 15 minutes before your scheduled appointment time. You may need to reschedule your appointment if you arrive late (10 or more minutes). Arriving late affects you and other patients whose appointments are after yours.  Also, if you miss three or more appointments without notifying the office, you may be dismissed from the clinic at the provider's discretion.    Again, thank you for choosing [facility].  Our hope is that these requests will decrease the amount of time that you wait before being seen by our physicians.   If you have a lab appointment with the Cancer Center - please note that after April 8th, all labs will be drawn in the cancer center.  You do not have to check in or register with the main entrance as you have in the past but will complete your check-in at the cancer center.            _____________________________________________________________  Should you have questions after your visit to [facility], please contact our office at [phone] and follow the prompts.  Our office hours are 8:00 a.m. to 4:30 p.m. Monday - Thursday and 8:00 a.m. to 2:30 p.m. Friday.  Please note that voicemails left after 4:00 p.m. may not be returned until the following business day.  We are closed weekends and all major holidays.  You do have access to a nurse 24-7, just call the main number to the clinic [phone] and do not press any  options, hold on the line and a nurse will answer the phone.    For prescription refill requests, have your pharmacy contact our office and allow 72 hours.    Masks are no longer required in the cancer centers. If you would like for your care team to wear a mask while they are taking care of you, please let them know. You may have one support person who is at least 87 years old accompany you for your appointments.

## 2023-07-13 ENCOUNTER — Other Ambulatory Visit: Payer: Medicare Other

## 2023-07-18 ENCOUNTER — Other Ambulatory Visit: Payer: Self-pay

## 2023-07-18 ENCOUNTER — Emergency Department (HOSPITAL_COMMUNITY): Payer: Medicare Other

## 2023-07-18 ENCOUNTER — Encounter (HOSPITAL_COMMUNITY): Payer: Self-pay

## 2023-07-18 ENCOUNTER — Inpatient Hospital Stay (HOSPITAL_COMMUNITY)
Admission: EM | Admit: 2023-07-18 | Discharge: 2023-07-19 | DRG: 389 | Disposition: A | Discharge status: Home / Self Care | Payer: Medicare Other | Attending: Family Medicine | Admitting: Family Medicine

## 2023-07-18 DIAGNOSIS — J449 Chronic obstructive pulmonary disease, unspecified: Secondary | ICD-10-CM | POA: Diagnosis not present

## 2023-07-18 DIAGNOSIS — Z932 Ileostomy status: Secondary | ICD-10-CM

## 2023-07-18 DIAGNOSIS — Z85828 Personal history of other malignant neoplasm of skin: Secondary | ICD-10-CM | POA: Diagnosis not present

## 2023-07-18 DIAGNOSIS — E7800 Pure hypercholesterolemia, unspecified: Secondary | ICD-10-CM | POA: Diagnosis present

## 2023-07-18 DIAGNOSIS — F17210 Nicotine dependence, cigarettes, uncomplicated: Secondary | ICD-10-CM | POA: Diagnosis present

## 2023-07-18 DIAGNOSIS — E861 Hypovolemia: Secondary | ICD-10-CM | POA: Diagnosis present

## 2023-07-18 DIAGNOSIS — I959 Hypotension, unspecified: Secondary | ICD-10-CM | POA: Diagnosis present

## 2023-07-18 DIAGNOSIS — R1032 Left lower quadrant pain: Secondary | ICD-10-CM | POA: Diagnosis not present

## 2023-07-18 DIAGNOSIS — Z7951 Long term (current) use of inhaled steroids: Secondary | ICD-10-CM

## 2023-07-18 DIAGNOSIS — E871 Hypo-osmolality and hyponatremia: Secondary | ICD-10-CM | POA: Diagnosis not present

## 2023-07-18 DIAGNOSIS — Z79899 Other long term (current) drug therapy: Secondary | ICD-10-CM

## 2023-07-18 DIAGNOSIS — I10 Essential (primary) hypertension: Secondary | ICD-10-CM | POA: Diagnosis present

## 2023-07-18 DIAGNOSIS — Z66 Do not resuscitate: Secondary | ICD-10-CM | POA: Diagnosis present

## 2023-07-18 DIAGNOSIS — Z85118 Personal history of other malignant neoplasm of bronchus and lung: Secondary | ICD-10-CM

## 2023-07-18 DIAGNOSIS — Z801 Family history of malignant neoplasm of trachea, bronchus and lung: Secondary | ICD-10-CM | POA: Diagnosis not present

## 2023-07-18 DIAGNOSIS — Z923 Personal history of irradiation: Secondary | ICD-10-CM | POA: Diagnosis not present

## 2023-07-18 DIAGNOSIS — Z4682 Encounter for fitting and adjustment of non-vascular catheter: Secondary | ICD-10-CM | POA: Diagnosis not present

## 2023-07-18 DIAGNOSIS — K5669 Other partial intestinal obstruction: Secondary | ICD-10-CM | POA: Diagnosis not present

## 2023-07-18 DIAGNOSIS — C3490 Malignant neoplasm of unspecified part of unspecified bronchus or lung: Secondary | ICD-10-CM | POA: Diagnosis not present

## 2023-07-18 DIAGNOSIS — Z9049 Acquired absence of other specified parts of digestive tract: Secondary | ICD-10-CM

## 2023-07-18 DIAGNOSIS — N179 Acute kidney failure, unspecified: Secondary | ICD-10-CM | POA: Diagnosis not present

## 2023-07-18 DIAGNOSIS — L8990 Pressure ulcer of unspecified site, unspecified stage: Secondary | ICD-10-CM | POA: Insufficient documentation

## 2023-07-18 DIAGNOSIS — E860 Dehydration: Secondary | ICD-10-CM | POA: Diagnosis present

## 2023-07-18 DIAGNOSIS — K56609 Unspecified intestinal obstruction, unspecified as to partial versus complete obstruction: Principal | ICD-10-CM | POA: Diagnosis present

## 2023-07-18 DIAGNOSIS — Z8489 Family history of other specified conditions: Secondary | ICD-10-CM

## 2023-07-18 DIAGNOSIS — K56600 Partial intestinal obstruction, unspecified as to cause: Principal | ICD-10-CM | POA: Diagnosis present

## 2023-07-18 DIAGNOSIS — R1110 Vomiting, unspecified: Secondary | ICD-10-CM | POA: Diagnosis not present

## 2023-07-18 DIAGNOSIS — Z803 Family history of malignant neoplasm of breast: Secondary | ICD-10-CM

## 2023-07-18 LAB — COMPREHENSIVE METABOLIC PANEL
ALT: 45 U/L — ABNORMAL HIGH (ref 0–44)
AST: 61 U/L — ABNORMAL HIGH (ref 15–41)
Albumin: 4.2 g/dL (ref 3.5–5.0)
Alkaline Phosphatase: 70 U/L (ref 38–126)
Anion gap: 15 (ref 5–15)
BUN: 32 mg/dL — ABNORMAL HIGH (ref 8–23)
CO2: 21 mmol/L — ABNORMAL LOW (ref 22–32)
Calcium: 9.1 mg/dL (ref 8.9–10.3)
Chloride: 93 mmol/L — ABNORMAL LOW (ref 98–111)
Creatinine, Ser: 1.8 mg/dL — ABNORMAL HIGH (ref 0.44–1.00)
GFR, Estimated: 27 mL/min — ABNORMAL LOW (ref >60)
Glucose, Bld: 152 mg/dL — ABNORMAL HIGH (ref 70–99)
Potassium: 4.3 mmol/L (ref 3.5–5.1)
Sodium: 129 mmol/L — ABNORMAL LOW (ref 135–145)
Total Bilirubin: 1.5 mg/dL — ABNORMAL HIGH (ref 0.3–1.2)
Total Protein: 7.8 g/dL (ref 6.5–8.1)

## 2023-07-18 LAB — CBC WITH DIFFERENTIAL/PLATELET
Abs Immature Granulocytes: 0.01 10*3/uL (ref 0.00–0.07)
Basophils Absolute: 0 10*3/uL (ref 0.0–0.1)
Basophils Relative: 1 %
Eosinophils Absolute: 0.1 10*3/uL (ref 0.0–0.5)
Eosinophils Relative: 1 %
HCT: 39.8 % (ref 36.0–46.0)
Hemoglobin: 13.5 g/dL (ref 12.0–15.0)
Immature Granulocytes: 0 %
Lymphocytes Relative: 9 %
Lymphs Abs: 0.5 10*3/uL — ABNORMAL LOW (ref 0.7–4.0)
MCH: 32.5 pg (ref 26.0–34.0)
MCHC: 33.9 g/dL (ref 30.0–36.0)
MCV: 95.9 fL (ref 80.0–100.0)
Monocytes Absolute: 1 10*3/uL (ref 0.1–1.0)
Monocytes Relative: 16 %
Neutro Abs: 4.3 10*3/uL (ref 1.7–7.7)
Neutrophils Relative %: 73 %
Platelets: 276 10*3/uL (ref 150–400)
RBC: 4.15 MIL/uL (ref 3.87–5.11)
RDW: 13.9 % (ref 11.5–15.5)
WBC: 5.9 10*3/uL (ref 4.0–10.5)
nRBC: 0 % (ref 0.0–0.2)

## 2023-07-18 MED ORDER — SODIUM CHLORIDE 0.9 % IV SOLN: INTRAVENOUS | Status: DC

## 2023-07-18 MED ORDER — ALBUTEROL SULFATE (2.5 MG/3ML) 0.083% IN NEBU: 2.5000 mg | INHALATION_SOLUTION | RESPIRATORY_TRACT | Status: DC | PRN

## 2023-07-18 MED ORDER — OXYCODONE HCL 5 MG PO TABS: 5.0000 mg | ORAL_TABLET | ORAL | Status: DC | PRN

## 2023-07-18 MED ORDER — ACETAMINOPHEN 650 MG RE SUPP: 650.0000 mg | Freq: Four times a day (QID) | RECTAL | Status: DC | PRN

## 2023-07-18 MED ORDER — HEPARIN SODIUM (PORCINE) 5000 UNIT/ML IJ SOLN
5000.0000 [IU] | Freq: Two times a day (BID) | INTRAMUSCULAR | Status: DC
  Administered 2023-07-18 – 2023-07-19 (×2): 5000 [IU] via SUBCUTANEOUS
  Filled 2023-07-18 (×2): qty 1

## 2023-07-18 MED ORDER — ENOXAPARIN SODIUM 40 MG/0.4ML IJ SOSY: 40.0000 mg | PREFILLED_SYRINGE | INTRAMUSCULAR | Status: DC

## 2023-07-18 MED ORDER — CARMEX CLASSIC LIP BALM EX OINT
TOPICAL_OINTMENT | CUTANEOUS | Status: DC | PRN
  Filled 2023-07-18 (×2): qty 10

## 2023-07-18 MED ORDER — ONDANSETRON HCL 4 MG/2ML IJ SOLN: 4.0000 mg | Freq: Four times a day (QID) | INTRAMUSCULAR | Status: DC | PRN

## 2023-07-18 MED ORDER — SODIUM CHLORIDE 0.9 % IV BOLUS
1000.0000 mL | Freq: Once | INTRAVENOUS | Status: AC
  Administered 2023-07-18: 1000 mL via INTRAVENOUS

## 2023-07-18 MED ORDER — ONDANSETRON HCL 4 MG PO TABS: 4.0000 mg | ORAL_TABLET | Freq: Four times a day (QID) | ORAL | Status: DC | PRN

## 2023-07-18 MED ORDER — LACTATED RINGERS IV BOLUS
500.0000 mL | Freq: Once | INTRAVENOUS | Status: AC
  Administered 2023-07-18: 500 mL via INTRAVENOUS

## 2023-07-18 MED ORDER — DIATRIZOATE MEGLUMINE & SODIUM 66-10 % PO SOLN
90.0000 mL | Freq: Once | ORAL | Status: AC
  Administered 2023-07-18: 90 mL via NASOGASTRIC
  Filled 2023-07-18: qty 90

## 2023-07-18 MED ORDER — ACETAMINOPHEN 325 MG PO TABS: 650.0000 mg | ORAL_TABLET | Freq: Four times a day (QID) | ORAL | Status: DC | PRN

## 2023-07-18 MED ORDER — ONDANSETRON HCL 4 MG/2ML IJ SOLN
4.0000 mg | Freq: Once | INTRAMUSCULAR | Status: AC
  Administered 2023-07-18: 4 mg via INTRAVENOUS
  Filled 2023-07-18: qty 2

## 2023-07-18 MED ORDER — HYDROMORPHONE HCL 1 MG/ML IJ SOLN: 0.5000 mg | INTRAMUSCULAR | Status: DC | PRN

## 2023-07-18 NOTE — Progress Notes (Signed)
NG tube was at the 45 cm mark (pulled out 5 cm from original insertion). In reviewing pt's xray, we need to advance NG tube another 10 cm. Thus NG tube advanced a total of 15 cm, to the 67 cm mark and secured w safety pin onto her gown. Currently draining greenish material w sm amt coffee ground material.

## 2023-07-18 NOTE — ED Notes (Signed)
ED TO INPATIENT HANDOFF REPORT  ED Nurse Name and Phone #: Crist Infante, RN [phone]  S Name/Age/Gender Kathleen Pope 89 y.o. female Room/Bed: WA13/WA13  Code Status   Code Status: Prior  Home/SNF/Other Home Patient oriented to: self, place, time, and situation Is this baseline? Yes   Triage Complete: Triage complete  Chief Complaint suture issue;abdominal pain;emesis  Triage Note Patient said she feels like the stitches in her rectum have become lose. Had ostomy bag placed 3 years ago but has noticed when she wipes her rectum she has stool now. Has generalized abdominal pain and vomited yesterday.    Allergies No Known Allergies  Level of Care/Admitting Diagnosis ED Disposition     None       B Medical/Surgery History Past Medical History:  Diagnosis Date   Anemia    pt denies, yet takes iron   Chronic airway obstruction, not elsewhere classified    Complication of anesthesia    COPD (chronic obstructive pulmonary disease) (HCC)    Diverticulosis    GI hemorrhage from post-treatment cecal ulcer 08/18/2012   Hemorrhoids    HTN (hypertension)    Hypercholesteremia    Lower GI bleed multiple   AVM's   Lung cancer (HCC) 02/14/2023   PONV (postoperative nausea and vomiting)    Past Surgical History:  Procedure Laterality Date   BRONCHIAL BIOPSY  02/14/2023   Procedure: BRONCHIAL BIOPSIES;  Surgeon: Leslye Peer, MD;  Location: [facility] ENDOSCOPY;  Service: Pulmonary;;   BRONCHIAL BRUSHINGS  02/14/2023   Procedure: BRONCHIAL BRUSHINGS;  Surgeon: Leslye Peer, MD;  Location[facility];  Service: Pulmonary;;   BRONCHIAL NEEDLE ASPIRATION BIOPSY  02/14/2023   Procedure: BRONCHIAL NEEDLE ASPIRATION BIOPSIES;  Surgeon: Leslye Peer, MD;  Location: [facility] ENDOSCOPY;  Service: Pulmonary;;   BRONCHIAL WASHINGS  02/14/2023   Procedure: BRONCHIAL WASHINGS;  Surgeon: Leslye Peer, MD;  Location: [facility] ENDOSCOPY;  Service: Pulmonary;;   BUNIONECTOMY     left   COLECTOMY      COLON RESECTION N/A 01/17/2021   Procedure: EXPLORATORY LAPAROTOMY COMPLETION COLECTOMY ileostomy;  Surgeon: Axel Filler, MD;  Location: [facility] ORS;  Service: General;  Laterality: N/A;   COLONOSCOPY  08/10/2012   Procedure: COLONOSCOPY;  Surgeon: Meryl Dare, MD,FACG;  Location: [facility] ENDOSCOPY;  Service: Endoscopy;  Laterality: N/A;   COLONOSCOPY  08/19/2012   Procedure: COLONOSCOPY;  Surgeon: Iva Boop, MD;  Location: [facility] ENDOSCOPY;  Service: Endoscopy;  Laterality: N/A;   COLONOSCOPY N/A 03/11/2013   Procedure: COLONOSCOPY;  Surgeon: Hilarie Fredrickson, MD;  Location: [facility] ENDOSCOPY;  Service: Endoscopy;  Laterality: N/A;   COLONOSCOPY N/A 01/16/2021   Procedure: COLONOSCOPY;  Surgeon: Sherrilyn Rist, MD;  Location: [facility] ENDOSCOPY;  Service: Gastroenterology;  Laterality: N/A;   COLONOSCOPY WITH PROPOFOL N/A 01/10/2021   Procedure: COLONOSCOPY WITH PROPOFOL;  Surgeon: Hilarie Fredrickson, MD;  Location: [facility] ENDOSCOPY;  Service: Endoscopy;  Laterality: N/A;   COLOSTOMY N/A 01/15/2021   Procedure: COLOSTOMY;  Surgeon: Harriette Bouillon, MD;  Location: [facility] ORS;  Service: General;  Laterality: N/A;   ESOPHAGOGASTRODUODENOSCOPY (EGD) WITH PROPOFOL N/A 01/14/2021   Procedure: ESOPHAGOGASTRODUODENOSCOPY (EGD) WITH PROPOFOL;  Surgeon: Sherrilyn Rist, MD;  Location: [facility] ENDOSCOPY;  Service: Endoscopy;  Laterality: N/A;   FIDUCIAL MARKER PLACEMENT  02/14/2023   Procedure: FIDUCIAL MARKER PLACEMENT;  Surgeon: Leslye Peer, MD;  Location: [facility] ENDOSCOPY;  Service: Pulmonary;;   FLEXIBLE SIGMOIDOSCOPY N/A 01/14/2021   Procedure: FLEXIBLE SIGMOIDOSCOPY;  Surgeon: Amada Jupiter  L III, MD;  Location: [facility] ENDOSCOPY;  Service: Endoscopy;  Laterality: N/A;   HOT HEMOSTASIS N/A 01/10/2021   Procedure: HOT HEMOSTASIS (ARGON PLASMA COAGULATION/BICAP);  Surgeon: Hilarie Fredrickson, MD;  Location: [facility] ENDOSCOPY;  Service: Endoscopy;  Laterality: N/A;   IR ANGIOGRAM SELECTIVE EACH ADDITIONAL VESSEL  01/12/2021   IR ANGIOGRAM  SELECTIVE EACH ADDITIONAL VESSEL  01/12/2021   IR ANGIOGRAM SELECTIVE EACH ADDITIONAL VESSEL  01/12/2021   IR ANGIOGRAM VISCERAL SELECTIVE  01/09/2021   IR ANGIOGRAM VISCERAL SELECTIVE  01/12/2021   IR EMBO ARTERIAL NOT HEMORR HEMANG INC GUIDE ROADMAPPING  01/12/2021   IR US GUIDE VASC ACCESS RIGHT  01/09/2021   IR US GUIDE VASC ACCESS RIGHT  01/12/2021   PARTIAL COLECTOMY N/A 01/15/2021   Procedure: PARTIAL COLECTOMY;  Surgeon: Harriette Bouillon, MD;  Location: [facility] ORS;  Service: General;  Laterality: N/A;   POLYPECTOMY  01/10/2021   Procedure: POLYPECTOMY;  Surgeon: Hilarie Fredrickson, MD;  Location: [facility] ENDOSCOPY;  Service: Endoscopy;;   SKIN CANCER EXCISION Right 12/2022   Right Leg above knee   TONSILLECTOMY     VIDEO BRONCHOSCOPY WITH ENDOBRONCHIAL ULTRASOUND Bilateral 02/14/2023   Procedure: VIDEO BRONCHOSCOPY WITH ENDOBRONCHIAL ULTRASOUND;  Surgeon: Leslye Peer, MD;  Location: [facility] ENDOSCOPY;  Service: Pulmonary;  Laterality: Bilateral;   VIDEO BRONCHOSCOPY WITH RADIAL ENDOBRONCHIAL ULTRASOUND  02/14/2023   Procedure: VIDEO BRONCHOSCOPY WITH RADIAL ENDOBRONCHIAL ULTRASOUND;  Surgeon: Leslye Peer, MD;  Location: [facility] ENDOSCOPY;  Service: Pulmonary;;     A IV Location/Drains/Wounds Patient Lines/Drains/Airways Status     Active Line/Drains/Airways     Name Placement date Placement time Site Days   Peripheral IV 07/18/23 20 G 1" Left Antecubital 07/18/23  1034  Antecubital  less than 1   Ileostomy Standard (end) RUQ 01/17/21  0916  RUQ  912            Intake/Output Last 24 hours  Intake/Output Summary (Last 24 hours) at 07/18/2023 1249 Last data filed at 07/18/2023 1206 Gross per 24 hour  Intake 999 ml  Output --  Net 999 ml    Labs/Imaging Results for orders placed or performed during the hospital encounter of 07/18/23 (from the past 48 hour(s))  Comprehensive metabolic panel     Status: Abnormal   Collection Time: 07/18/23 10:35 AM  Result Value Ref Range   Sodium 129  (L) 135 - 145 mmol/L   Potassium 4.3 3.5 - 5.1 mmol/L   Chloride 93 (L) 98 - 111 mmol/L   CO2 21 (L) 22 - 32 mmol/L   Glucose, Bld 152 (H) 70 - 99 mg/dL    Comment: Glucose reference range applies only to samples taken after fasting for at least 8 hours.   BUN 32 (H) 8 - 23 mg/dL   Creatinine, Ser 4.09 (H) 0.44 - 1.00 mg/dL   Calcium 9.1 8.9 - 81.1 mg/dL   Total Protein 7.8 6.5 - 8.1 g/dL   Albumin 4.2 3.5 - 5.0 g/dL   AST 61 (H) 15 - 41 U/L   ALT 45 (H) 0 - 44 U/L   Alkaline Phosphatase 70 38 - 126 U/L   Total Bilirubin 1.5 (H) 0.3 - 1.2 mg/dL   GFR, Estimated 27 (L) >60 mL/min    Comment: (NOTE) Calculated using the CKD-EPI Creatinine Equation (2021)    Anion gap 15 5 - 15    Comment: Performed at [facility], [address], [place], [place] [address]  CBC with Differential  Status: Abnormal   Collection Time: 07/18/23 10:35 AM  Result Value Ref Range   WBC 5.9 4.0 - 10.5 K/uL   RBC 4.15 3.87 - 5.11 MIL/uL   Hemoglobin 13.5 12.0 - 15.0 g/dL   HCT 66.0 63.0 - 16.0 %   MCV 95.9 80.0 - 100.0 fL   MCH 32.5 26.0 - 34.0 pg   MCHC 33.9 30.0 - 36.0 g/dL   RDW 10.9 32.3 - 55.7 %   Platelets 276 150 - 400 K/uL   nRBC 0.0 0.0 - 0.2 %   Neutrophils Relative % 73 %   Neutro Abs 4.3 1.7 - 7.7 K/uL   Lymphocytes Relative 9 %   Lymphs Abs 0.5 (L) 0.7 - 4.0 K/uL   Monocytes Relative 16 %   Monocytes Absolute 1.0 0.1 - 1.0 K/uL   Eosinophils Relative 1 %   Eosinophils Absolute 0.1 0.0 - 0.5 K/uL   Basophils Relative 1 %   Basophils Absolute 0.0 0.0 - 0.1 K/uL   Immature Granulocytes 0 %   Abs Immature Granulocytes 0.01 0.00 - 0.07 K/uL    Comment: Performed at [facility], [address] [place], [place] [address]   CT ABDOMEN PELVIS WO CONTRAST  Result Date: 07/18/2023 CLINICAL DATA:  Left lower quadrant pain. Vomiting. Previous colectomy. Lung carcinoma. * Tracking Code: BO * EXAM: CT ABDOMEN AND PELVIS WITHOUT CONTRAST TECHNIQUE:  Multidetector CT imaging of the abdomen and pelvis was performed following the standard protocol without IV contrast. RADIATION DOSE REDUCTION: This exam was performed according to the departmental dose-optimization program which includes automated exposure control, adjustment of the mA and/or kV according to patient size and/or use of iterative reconstruction technique. COMPARISON:  PET-CT on 01/06/2023 FINDINGS: Lower chest: No acute findings. Hepatobiliary:  No mass visualized on this unenhanced exam. Pancreas: No mass or inflammatory process visualized on this unenhanced exam. Spleen:  Within normal limits in size. Adrenals/Urinary tract: No evidence of urolithiasis or hydronephrosis. Unremarkable unopacified urinary bladder. Stomach/Bowel: Postop changes are seen from previous colectomy with right lower quadrant ileostomy. Moderately dilated small bowel loops are seen containing air-fluid levels, with feces sign in the right abdomen. Transition point is seen in the right abdomen, with nondilated distal small bowel proximal to the ileostomy. No mass or inflammatory process identified, and these findings are suspicious for adhesion. No evidence of free intraperitoneal air or abnormal fluid collections. Vascular/Lymphatic: No pathologically enlarged lymph nodes identified. No evidence of abdominal aortic aneurysm. Reproductive:  No mass or other significant abnormality. Other:  None. Musculoskeletal: No suspicious bone lesions identified. Bilateral L5 pars defects incidentally noted, with grade 1 anterolisthesis at L5-S1. IMPRESSION: Previous subtotal colectomy with right lower quadrant ileostomy. Distal small bowel obstruction, with transition point in the right abdomen suspicious for adhesion. Electronically Signed   By: Danae Orleans M.D.   On: 07/18/2023 12:10    Pending Labs Unresulted Labs (From admission, onward)    None       Vitals/Pain Today's Vitals   07/18/23 1007 07/18/23 1008 07/18/23 1227  07/18/23 1248  BP: (!) 88/63  99/67 (!) 95/55  Pulse: 95  83 86  Resp: 17   16  Temp: 98.1 F (36.7 C)  98.4 F (36.9 C)   TempSrc: Oral  Oral   SpO2: 100%  98% 99%  Weight:  40.8 kg    Height:  5' 1.5" (1.562 m)    PainSc:  8       Isolation Precautions No active isolations  Medications Medications  sodium chloride 0.9 % bolus 1,000 mL (0 mLs Intravenous Stopped 07/18/23 1206)  ondansetron (ZOFRAN) injection 4 mg (4 mg Intravenous Given 07/18/23 1035)    Mobility walks     Focused Assessments Neuro Assessment Handoff:  Swallow screen pass?  N/A         Neuro Assessment: Within Defined Limits Neuro Checks:      Has TPA been given? No If patient is a Neuro Trauma and patient is going to OR before floor call report to 4N Charge nurse: [phone] or [phone]   R Recommendations: See Admitting Provider Note  Report given to:   Additional Notes:

## 2023-07-18 NOTE — ED Provider Notes (Signed)
[place] EMERGENCY DEPARTMENT AT [facility] Provider Note   CSN: [id number] Arrival date & time: 07/18/23  1002     History  Chief Complaint  Patient presents with   Abdominal Pain    Porfiria Kathleen Pope is a 89 y.o. female.   Abdominal Pain Patient presents abdominal pain and vomiting.  Has ileostomy for previous colonic bleeding.  States for the last days she has had some stool when she wiped her rear end.  States she is worried her stitches have come undone.  Has had some vomiting.  Has had pain in her lower abdomen.  Has had typical output of her ileostomy.     Home Medications Prior to Admission medications   Medication Sig Start Date End Date Taking? Authorizing Provider  acetaminophen (TYLENOL) 500 MG tablet Take 2 tablets (1,000 mg total) by mouth every 6 (six) hours as needed for moderate pain or mild pain. 02/14/23   Leslye Peer, MD  amLODipine (NORVASC) 5 MG tablet Take 5 mg by mouth daily. 03/22/22   [provider]  Calcium Carbonate Antacid (CALCIUM CARBONATE PO) Take 500 mg by mouth daily.    [provider]  cephALEXin (KEFLEX) 500 MG capsule Take 500 mg by mouth 2 (two) times daily. 01/27/23   [provider]  ECHINACEA PO Take 167 mg by mouth daily.    [provider]  Ferrous Sulfate 27 MG TABS Take 54 mg by mouth daily with breakfast.    [provider]  fluticasone-salmeterol (ADVAIR) 100-50 MCG/ACT AEPB Inhale 1 puff into the lungs 2 (two) times daily. 02/03/23   Luciano Cutter, MD  ibuprofen (ADVIL) 200 MG tablet Take 400 mg by mouth every 6 (six) hours as needed for moderate pain or mild pain.    [provider]  losartan (COZAAR) 50 MG tablet Take 50 mg by mouth daily.    [provider]  metoprolol tartrate (LOPRESSOR) 25 MG tablet Take 0.5 tablets (12.5 mg total) by mouth 2 (two) times daily. 01/25/21   Rai, Delene Ruffini, MD  Multiple Vitamins-Minerals (MULTIVITAMIN WITH MINERALS) tablet  Take 1 tablet by mouth daily. Centrum Silver    [provider]  neomycin-polymyxin-pramoxine (NEOSPORIN PLUS) 1 % cream Apply 1 Application topically daily.    [provider]  Omega-3 Fatty Acids (FISH OIL) 1000 MG CAPS Take 1,000 Units by mouth daily.    [provider]  Potassium 99 MG TABS Take by mouth.    [provider]  pravastatin (PRAVACHOL) 10 MG tablet Take 10 mg by mouth daily.    [provider]      Allergies    Patient has no known allergies.    Review of Systems   Review of Systems  Gastrointestinal:  Positive for abdominal pain.    Physical Exam Updated Vital Signs BP (!) 95/55 (BP Location: Left Arm)   Pulse 86   Temp 98.4 F (36.9 C) (Oral)   Resp 16   Ht 5' 1.5" (1.562 m)   Wt 40.8 kg   SpO2 99%   BMI 16.73 kg/m  Physical Exam Vitals and nursing note reviewed.  Cardiovascular:     Rate and Rhythm: Normal rate.  Abdominal:     Hernia: No hernia is present.     Comments: Right quadrant ileostomy.  Some greenish drainage in bag.  Tenderness and fullness in lower abdomen.  Skin:    General: Skin is warm.  Neurological:     Mental  Status: She is alert and oriented to person, place, and time.     ED Results / Procedures / Treatments   Labs (all labs ordered are listed, but only abnormal results are displayed) Labs Reviewed  COMPREHENSIVE METABOLIC PANEL - Abnormal; Notable for the following components:      Result Value   Sodium 129 (*)    Chloride 93 (*)    CO2 21 (*)    Glucose, Bld 152 (*)    BUN 32 (*)    Creatinine, Ser 1.80 (*)    AST 61 (*)    ALT 45 (*)    Total Bilirubin 1.5 (*)    GFR, Estimated 27 (*)    All other components within normal limits  CBC WITH DIFFERENTIAL/PLATELET - Abnormal; Notable for the following components:   Lymphs Abs 0.5 (*)    All other components within normal limits    EKG None  Radiology CT ABDOMEN PELVIS WO CONTRAST  Result Date: 07/18/2023 CLINICAL  DATA:  Left lower quadrant pain. Vomiting. Previous colectomy. Lung carcinoma. * Tracking Code: BO * EXAM: CT ABDOMEN AND PELVIS WITHOUT CONTRAST TECHNIQUE: Multidetector CT imaging of the abdomen and pelvis was performed following the standard protocol without IV contrast. RADIATION DOSE REDUCTION: This exam was performed according to the departmental dose-optimization program which includes automated exposure control, adjustment of the mA and/or kV according to patient size and/or use of iterative reconstruction technique. COMPARISON:  PET-CT on 01/06/2023 FINDINGS: Lower chest: No acute findings. Hepatobiliary:  No mass visualized on this unenhanced exam. Pancreas: No mass or inflammatory process visualized on this unenhanced exam. Spleen:  Within normal limits in size. Adrenals/Urinary tract: No evidence of urolithiasis or hydronephrosis. Unremarkable unopacified urinary bladder. Stomach/Bowel: Postop changes are seen from previous colectomy with right lower quadrant ileostomy. Moderately dilated small bowel loops are seen containing air-fluid levels, with feces sign in the right abdomen. Transition point is seen in the right abdomen, with nondilated distal small bowel proximal to the ileostomy. No mass or inflammatory process identified, and these findings are suspicious for adhesion. No evidence of free intraperitoneal air or abnormal fluid collections. Vascular/Lymphatic: No pathologically enlarged lymph nodes identified. No evidence of abdominal aortic aneurysm. Reproductive:  No mass or other significant abnormality. Other:  None. Musculoskeletal: No suspicious bone lesions identified. Bilateral L5 pars defects incidentally noted, with grade 1 anterolisthesis at L5-S1. IMPRESSION: Previous subtotal colectomy with right lower quadrant ileostomy. Distal small bowel obstruction, with transition point in the right abdomen suspicious for adhesion. Electronically Signed   By: Danae Orleans M.D.   On: 07/18/2023  12:10    Procedures Procedures    Medications Ordered in ED Medications  lactated ringers bolus 500 mL (has no administration in time range)  sodium chloride 0.9 % bolus 1,000 mL (0 mLs Intravenous Stopped 07/18/23 1206)  ondansetron (ZOFRAN) injection 4 mg (4 mg Intravenous Given 07/18/23 1035)    ED Course/ Medical Decision Making/ A&P                                 Medical Decision Making Amount and/or Complexity of Data Reviewed Labs: ordered. Radiology: ordered.  Risk Prescription drug management.    patient with reported stool coming out of her rectal stump.  Also lower abdominal pain.  Has an increased creatinine.  Could be some dehydration.  Reportedly has had some vomiting.  Patient states normal output of her ileostomy but for  me volume appears decreased.  Will get CT scan to evaluate.  Creatinine now up to 1.8 so we will do without IV contrast.   CT scan shows bowel obstruction.  Has been vomiting so we will place NG tube.  Will discuss with general surgery but will require medicine admission.          Final Clinical Impression(s) / ED Diagnoses Final diagnoses:  Small bowel obstruction [facility])    Rx / DC Orders ED Discharge Orders     None         Benjiman Core, MD 07/18/23 1251

## 2023-07-18 NOTE — H&P (Signed)
History and Physical  Kathleen Pope:[id number] DOB: 08/26/1933 DOA: 07/18/2023  PCP: Elfredia Nevins, MD   Chief Complaint: Abdominal pain and vomiting  HPI: Kathleen Pope is a 89 y.o. female with medical history significant for tobacco abuse, lung cancer status post radiation, COPD on room air, total colectomy in 2019 due to GI bleed with ileostomy, being admitted to the hospital with 3 days of abdominal pain and vomiting, found to have small bowel obstruction.  Patient states she had sudden onset of severe lower abdominal pain about 3 days ago, no fevers, intermittent nausea with vomiting.  Was having movements into her ileostomy up until yesterday.  She does not think she has had a bowel obstruction in the past.  Denies any fevers, chills, chest pain.  No blood in her emesis.  ED Course: Presented to the emergency department for evaluation this morning, blood pressure has been on the low side as low as 88/63, she denies any dizziness.  Lab work including CBC and CMP significant for sodium 129, creatinine up to 1.80 from baseline 0.8, AST and ALT slightly elevated 61/45, total bilirubin 1.5.  CT scan as detailed below shows evidence of small bowel obstruction.  Patient was given IV fluids, pain and nausea medication, and NG tube has been placed.  About 300 cc of bilious material collecting in canister.  ER provider discussed with general surgery who will consult, and recommends hospitalist admission.  Review of Systems: Please see HPI for pertinent positives and negatives. A complete 10 system review of systems are otherwise negative.  Past Medical History:  Diagnosis Date   Anemia    pt denies, yet takes iron   Chronic airway obstruction, not elsewhere classified    Complication of anesthesia    COPD (chronic obstructive pulmonary disease) (HCC)    Diverticulosis    GI hemorrhage from post-treatment cecal ulcer 08/18/2012   Hemorrhoids    HTN (hypertension)    Hypercholesteremia    Lower  GI bleed multiple   AVM's   Lung cancer (HCC) 02/14/2023   PONV (postoperative nausea and vomiting)    Past Surgical History:  Procedure Laterality Date   BRONCHIAL BIOPSY  02/14/2023   Procedure: BRONCHIAL BIOPSIES;  Surgeon: Leslye Peer, MD;  Location: [facility] ENDOSCOPY;  Service: Pulmonary;;   BRONCHIAL BRUSHINGS  02/14/2023   Procedure: BRONCHIAL BRUSHINGS;  Surgeon: Leslye Peer, MD;  Location: [facility] ENDOSCOPY;  Service: Pulmonary;;   BRONCHIAL NEEDLE ASPIRATION BIOPSY  02/14/2023   Procedure: BRONCHIAL NEEDLE ASPIRATION BIOPSIES;  Surgeon: Leslye Peer, MD;  Location: [facility] ENDOSCOPY;  Service: Pulmonary;;   BRONCHIAL WASHINGS  02/14/2023   Procedure: BRONCHIAL WASHINGS;  Surgeon: Leslye Peer, MD;  Location: [facility] ENDOSCOPY;  Service: Pulmonary;;   BUNIONECTOMY     left   COLECTOMY     COLON RESECTION N/A 01/17/2021   Procedure: EXPLORATORY LAPAROTOMY COMPLETION COLECTOMY ileostomy;  Surgeon: Axel Filler, MD;  Location: [facility] ORS;  Service: General;  Laterality: N/A;   COLONOSCOPY  08/10/2012   Procedure: COLONOSCOPY;  Surgeon: Meryl Dare, MD,FACG;  Location: [facility] ENDOSCOPY;  Service: Endoscopy;  Laterality: N/A;   COLONOSCOPY  08/19/2012   Procedure: COLONOSCOPY;  Surgeon: Iva Boop, MD;  Location: [facility] ENDOSCOPY;  Service: Endoscopy;  Laterality: N/A;   COLONOSCOPY N/A 03/11/2013   Procedure: COLONOSCOPY;  Surgeon: Hilarie Fredrickson, MD;  Location: [facility] ENDOSCOPY;  Service: Endoscopy;  Laterality: N/A;   COLONOSCOPY N/A 01/16/2021   Procedure: COLONOSCOPY;  Surgeon: Charlie Pitter  III, MD;  Location: [facility] ENDOSCOPY;  Service: Gastroenterology;  Laterality: N/A;   COLONOSCOPY WITH PROPOFOL N/A 01/10/2021   Procedure: COLONOSCOPY WITH PROPOFOL;  Surgeon: Hilarie Fredrickson, MD;  Location: [facility] ENDOSCOPY;  Service: Endoscopy;  Laterality: N/A;   COLOSTOMY N/A 01/15/2021   Procedure: COLOSTOMY;  Surgeon: Harriette Bouillon, MD;  Location: [facility] ORS;  Service: General;  Laterality: N/A;    ESOPHAGOGASTRODUODENOSCOPY (EGD) WITH PROPOFOL N/A 01/14/2021   Procedure: ESOPHAGOGASTRODUODENOSCOPY (EGD) WITH PROPOFOL;  Surgeon: Sherrilyn Rist, MD;  Location: [facility] ENDOSCOPY;  Service: Endoscopy;  Laterality: N/A;   FIDUCIAL MARKER PLACEMENT  02/14/2023   Procedure: FIDUCIAL MARKER PLACEMENT;  Surgeon: Leslye Peer, MD;  Location: [facility] ENDOSCOPY;  Service: Pulmonary;;   FLEXIBLE SIGMOIDOSCOPY N/A 01/14/2021   Procedure: Arnell Sieving;  Surgeon: Sherrilyn Rist, MD;  Location: [facility] ENDOSCOPY;  Service: Endoscopy;  Laterality: N/A;   HOT HEMOSTASIS N/A 01/10/2021   Procedure: HOT HEMOSTASIS (ARGON PLASMA COAGULATION/BICAP);  Surgeon: Hilarie Fredrickson, MD;  Location: [facility] ENDOSCOPY;  Service: Endoscopy;  Laterality: N/A;   IR ANGIOGRAM SELECTIVE EACH ADDITIONAL VESSEL  01/12/2021   IR ANGIOGRAM SELECTIVE EACH ADDITIONAL VESSEL  01/12/2021   IR ANGIOGRAM SELECTIVE EACH ADDITIONAL VESSEL  01/12/2021   IR ANGIOGRAM VISCERAL SELECTIVE  01/09/2021   IR ANGIOGRAM VISCERAL SELECTIVE  01/12/2021   IR EMBO ARTERIAL NOT HEMORR HEMANG INC GUIDE ROADMAPPING  01/12/2021   IR US GUIDE VASC ACCESS RIGHT  01/09/2021   IR US GUIDE VASC ACCESS RIGHT  01/12/2021   PARTIAL COLECTOMY N/A 01/15/2021   Procedure: PARTIAL COLECTOMY;  Surgeon: Harriette Bouillon, MD;  Location: [facility] ORS;  Service: General;  Laterality: N/A;   POLYPECTOMY  01/10/2021   Procedure: POLYPECTOMY;  Surgeon: Hilarie Fredrickson, MD;  Location: [facility] ENDOSCOPY;  Service: Endoscopy;;   SKIN CANCER EXCISION Right 12/2022   Right Leg above knee   TONSILLECTOMY     VIDEO BRONCHOSCOPY WITH ENDOBRONCHIAL ULTRASOUND Bilateral 02/14/2023   Procedure: VIDEO BRONCHOSCOPY WITH ENDOBRONCHIAL ULTRASOUND;  Surgeon: Leslye Peer, MD;  Location: [facility] ENDOSCOPY;  Service: Pulmonary;  Laterality: Bilateral;   VIDEO BRONCHOSCOPY WITH RADIAL ENDOBRONCHIAL ULTRASOUND  02/14/2023   Procedure: VIDEO BRONCHOSCOPY WITH RADIAL ENDOBRONCHIAL ULTRASOUND;  Surgeon:  Leslye Peer, MD;  Location: [facility] ENDOSCOPY;  Service: Pulmonary;;    Social History:  reports that she has been smoking cigarettes. She has a 59 pack-year smoking history. She has never used smokeless tobacco. She reports that she does not drink alcohol and does not use drugs.   No Known Allergies  Family History  Problem Relation Age of Onset   Breast cancer Mother    Lung cancer Father    Breast cancer Sister    Anuerysm Brother        in stomach   Colon cancer Neg Hx    Esophageal cancer Neg Hx    Rectal cancer Neg Hx      Prior to Admission medications   Medication Sig Start Date End Date Taking? Authorizing Provider  acetaminophen (TYLENOL) 500 MG tablet Take 2 tablets (1,000 mg total) by mouth every 6 (six) hours as needed for moderate pain or mild pain. 02/14/23   Leslye Peer, MD  amLODipine (NORVASC) 5 MG tablet Take 5 mg by mouth daily. 03/22/22   [provider]  Calcium Carbonate Antacid (CALCIUM CARBONATE PO) Take 500 mg by mouth daily.    [provider]  cephALEXin (KEFLEX) 500 MG capsule Take 500 mg by mouth 2 (two)  times daily. 01/27/23   [provider]  ECHINACEA PO Take 167 mg by mouth daily.    [provider]  Ferrous Sulfate 27 MG TABS Take 54 mg by mouth daily with breakfast.    [provider]  fluticasone-salmeterol (ADVAIR) 100-50 MCG/ACT AEPB Inhale 1 puff into the lungs 2 (two) times daily. 02/03/23   Luciano Cutter, MD  ibuprofen (ADVIL) 200 MG tablet Take 400 mg by mouth every 6 (six) hours as needed for moderate pain or mild pain.    [provider]  losartan (COZAAR) 50 MG tablet Take 50 mg by mouth daily.    [provider]  metoprolol tartrate (LOPRESSOR) 25 MG tablet Take 0.5 tablets (12.5 mg total) by mouth 2 (two) times daily. 01/25/21   Rai, Delene Ruffini, MD  Multiple Vitamins-Minerals (MULTIVITAMIN WITH MINERALS) tablet Take 1 tablet by mouth daily. Centrum Silver    [provider]  neomycin-polymyxin-pramoxine (NEOSPORIN PLUS) 1 % cream Apply 1 Application topically daily.    [provider]  Omega-3 Fatty Acids (FISH OIL) 1000 MG CAPS Take 1,000 Units by mouth daily.    [provider]  Potassium 99 MG TABS Take by mouth.    [provider]  pravastatin (PRAVACHOL) 10 MG tablet Take 10 mg by mouth daily.    [provider]    Physical Exam: BP (!) 95/55 (BP Location: Left Arm)   Pulse 86   Temp 98.4 F (36.9 C) (Oral)   Resp 16   Ht 5' 1.5" (1.562 m)   Wt 40.8 kg   SpO2 99%   BMI 16.73 kg/m   General:  Alert, oriented, calm, in no acute distress, appears younger than her stated age, NG tube in place. Eyes: EOMI, clear conjuctivae, white sclerea Neck: supple, no masses, trachea mildline  Cardiovascular: RRR, no murmurs or rubs, no peripheral edema  Respiratory: clear to auscultation bilaterally, no wheezes, no crackles  Abdomen: soft, diffusely tender, very slightly distended  Skin: dry, no rashes  Musculoskeletal: no joint effusions, normal range of motion  Psychiatric: appropriate affect, normal speech  Neurologic: extraocular muscles intact, clear speech, moving all extremities with intact sensorium         Labs on Admission:  Basic Metabolic Panel: Recent Labs  Lab 07/18/23 1035  NA 129*  K 4.3  CL 93*  CO2 21*  GLUCOSE 152*  BUN 32*  CREATININE 1.80*  CALCIUM 9.1   Liver Function Tests: Recent Labs  Lab 07/18/23 1035  AST 61*  ALT 45*  ALKPHOS 70  BILITOT 1.5*  PROT 7.8  ALBUMIN 4.2   No results for input(s): "LIPASE", "AMYLASE" in the last 168 hours. No results for input(s): "AMMONIA" in the last 168 hours. CBC: Recent Labs  Lab 07/18/23 1035  WBC 5.9  NEUTROABS 4.3  HGB 13.5  HCT 39.8  MCV 95.9  PLT 276   Cardiac Enzymes: No results for input(s): "CKTOTAL", "CKMB", "CKMBINDEX", "TROPONINI" in the last 168 hours.  BNP (last 3 results) No results for input(s): "BNP"  in the last 8760 hours.  ProBNP (last 3 results) No results for input(s): "PROBNP" in the last 8760 hours.  CBG: No results for input(s): "GLUCAP" in the last 168 hours.  Radiological Exams on Admission: CT ABDOMEN PELVIS WO CONTRAST  Result Date: 07/18/2023 CLINICAL DATA:  Left lower quadrant pain. Vomiting. Previous colectomy. Lung carcinoma. * Tracking Code: BO * EXAM: CT ABDOMEN AND PELVIS WITHOUT CONTRAST TECHNIQUE: Multidetector CT imaging of  the abdomen and pelvis was performed following the standard protocol without IV contrast. RADIATION DOSE REDUCTION: This exam was performed according to the departmental dose-optimization program which includes automated exposure control, adjustment of the mA and/or kV according to patient size and/or use of iterative reconstruction technique. COMPARISON:  PET-CT on 01/06/2023 FINDINGS: Lower chest: No acute findings. Hepatobiliary:  No mass visualized on this unenhanced exam. Pancreas: No mass or inflammatory process visualized on this unenhanced exam. Spleen:  Within normal limits in size. Adrenals/Urinary tract: No evidence of urolithiasis or hydronephrosis. Unremarkable unopacified urinary bladder. Stomach/Bowel: Postop changes are seen from previous colectomy with right lower quadrant ileostomy. Moderately dilated small bowel loops are seen containing air-fluid levels, with feces sign in the right abdomen. Transition point is seen in the right abdomen, with nondilated distal small bowel proximal to the ileostomy. No mass or inflammatory process identified, and these findings are suspicious for adhesion. No evidence of free intraperitoneal air or abnormal fluid collections. Vascular/Lymphatic: No pathologically enlarged lymph nodes identified. No evidence of abdominal aortic aneurysm. Reproductive:  No mass or other significant abnormality. Other:  None. Musculoskeletal: No suspicious bone lesions identified. Bilateral L5 pars defects incidentally noted,  with grade 1 anterolisthesis at L5-S1. IMPRESSION: Previous subtotal colectomy with right lower quadrant ileostomy. Distal small bowel obstruction, with transition point in the right abdomen suspicious for adhesion. Electronically Signed   By: Danae Orleans M.D.   On: 07/18/2023 12:10    Assessment/Plan Kathleen Pope is a 89 y.o. female with medical history significant for tobacco abuse, lung cancer status post radiation, COPD on room air, total colectomy in 2019 due to GI bleed with ileostomy, being admitted to the hospital with 3 days of abdominal pain and vomiting, found to have small bowel obstruction.   Small bowel obstruction-with transition point in the right abdomen suspicion for adhesion -Inpatient admission -N.p.o. -IV normal saline at 100 cc/h -NG tube to low intermittent suction -Pain and nausea control as needed -EDP discussed with Dr. Luisa Hart general surgery  Hypertension-hold home medications due to hypotension  Acute kidney injury-likely prerenal azotemia from dehydration/hypotension -Avoid nephrotoxins -Hydrate with normal saline as above -Recheck renal function with daily labs  DVT prophylaxis: Heparin subcu    Code Status: Limited: Do not attempt resuscitation (DNR) -DNR-LIMITED -Do Not Intubate/DNI   Consults called: General Surgery  Admission status: The appropriate patient status for this patient is INPATIENT. Inpatient status is judged to be reasonable and necessary in order to provide the required intensity of service to ensure the patient's safety. The patient's presenting symptoms, physical exam findings, and initial radiographic and laboratory data in the context of their chronic comorbidities is felt to place them at high risk for further clinical deterioration. Furthermore, it is not anticipated that the patient will be medically stable for discharge from the hospital within 2 midnights of admission.    I certify that at the point of admission it is my clinical  judgment that the patient will require inpatient hospital care spanning beyond 2 midnights from the point of admission due to high intensity of service, high risk for further deterioration and high frequency of surveillance required  Time spent: 65 minutes  Deslyn Cavenaugh Sharlette Dense MD [facility] Pager [phone]  If 7PM-7AM, please contact night-coverage [email or web address] Password [email or web address]  07/18/2023, 2:03 PM

## 2023-07-18 NOTE — ED Notes (Signed)
X-ray called to check placement of NG tube

## 2023-07-18 NOTE — ED Notes (Signed)
RN placed order for X-ray for NG tube placement per imaging no order was in

## 2023-07-18 NOTE — ED Triage Notes (Signed)
Patient said she feels like the stitches in her rectum have become lose. Had ostomy bag placed 3 years ago but has noticed when she wipes her rectum she has stool now. Has generalized abdominal pain and vomited yesterday.

## 2023-07-18 NOTE — Progress Notes (Signed)
ANTICOAGULATION CONSULT NOTE - Initial Consult  Pharmacy Consult for Heparin Indication: VTE prophylaxis  No Known Allergies  Patient Measurements: Height: 5' 1.5" (156.2 cm) Weight: 40.8 kg (90 lb) IBW/kg (Calculated) : 48.95  Vital Signs: Temp: 98.4 F (36.9 C) (09/02 1227) Temp Source: Oral (09/02 1227) BP: 95/55 (09/02 1248) Pulse Rate: 86 (09/02 1248)  Labs: Recent Labs    07/18/23 1035  HGB 13.5  HCT 39.8  PLT 276  CREATININE 1.80*    Estimated Creatinine Clearance: 13.9 mL/min (A) (by C-G formula based on SCr of 1.8 mg/dL (H)).   Medical History: Past Medical History:  Diagnosis Date   Anemia    pt denies, yet takes iron   Chronic airway obstruction, not elsewhere classified    Complication of anesthesia    COPD (chronic obstructive pulmonary disease) (HCC)    Diverticulosis    GI hemorrhage from post-treatment cecal ulcer 08/18/2012   Hemorrhoids    HTN (hypertension)    Hypercholesteremia    Lower GI bleed multiple   AVM's   Lung cancer (HCC) 02/14/2023   PONV (postoperative nausea and vomiting)     Assessment: 89 y/oF admitted with SBO and AKI. Pharmacy consulted for heparin dosing for VTE prophylaxis. Patient not on anticoagulation PTA. CBC WNL. SCr 1.8 (baseline < 1). Weight = 40.8 kg.  Plan:  Heparin 5000 units SQ q12h (using frequency of q12h rather than q8h due to low weight and hx of GI bleed) Monitor CBC and for s/sx of bleeding   Pharmacy will sign off at this time.  Please reconsult if needed.    Greer Pickerel, PharmD, BCPS Clinical Pharmacist 07/18/2023 2:40 PM

## 2023-07-18 NOTE — Consult Note (Signed)
Reason for Consult: Small bowel obstruction Referring Physician: Rubin Payor MD Kathleen Pope is an 89 y.o. female.  HPI: Pleasant 89 year old female status post left colectomy in 2022 for GI bleeding due to a large bleeding mass.  Unfortunately, she continued to bleed postop and was returned to the operating room 2 days later after emergent colonoscopy showed further bleeding on the right colon.  This was removed and she had an ileostomy placed.  She presents with a 2-day history of nausea, vomiting, abdominal pain and rectal discharge.  She states she had a bowel movement through her rectum.  She has not had 1 before.  She has an ileostomy which is functioning but she has had less output with nausea vomiting crampy abdominal pain throughout.  A CT scan was obtained which showed findings concerning for small bowel obstruction.  Denies fever or chills.  She feels fatigued.  No blood in her ileostomy output or per rectum.  Past Medical History:  Diagnosis Date   Anemia    pt denies, yet takes iron   Chronic airway obstruction, not elsewhere classified    Complication of anesthesia    COPD (chronic obstructive pulmonary disease) (HCC)    Diverticulosis    GI hemorrhage from post-treatment cecal ulcer 08/18/2012   Hemorrhoids    HTN (hypertension)    Hypercholesteremia    Lower GI bleed multiple   AVM's   Lung cancer (HCC) 02/14/2023   PONV (postoperative nausea and vomiting)     Past Surgical History:  Procedure Laterality Date   BRONCHIAL BIOPSY  02/14/2023   Procedure: BRONCHIAL BIOPSIES;  Surgeon: Leslye Peer, MD;  Location: [facility] ENDOSCOPY;  Service: Pulmonary;;   BRONCHIAL BRUSHINGS  02/14/2023   Procedure: BRONCHIAL BRUSHINGS;  Surgeon: Leslye Peer, MD;  Location: [facility] ENDOSCOPY;  Service: Pulmonary;;   BRONCHIAL NEEDLE ASPIRATION BIOPSY  02/14/2023   Procedure: BRONCHIAL NEEDLE ASPIRATION BIOPSIES;  Surgeon: Leslye Peer, MD;  Location: [facility] ENDOSCOPY;  Service: Pulmonary;;    BRONCHIAL WASHINGS  02/14/2023   Procedure: BRONCHIAL WASHINGS;  Surgeon: Leslye Peer, MD;  Location: [facility] ENDOSCOPY;  Service: Pulmonary;;   BUNIONECTOMY     left   COLECTOMY     COLON RESECTION N/A 01/17/2021   Procedure: EXPLORATORY LAPAROTOMY COMPLETION COLECTOMY ileostomy;  Surgeon: Axel Filler, MD;  Location: [facility] ORS;  Service: General;  Laterality: N/A;   COLONOSCOPY  08/10/2012   Procedure: COLONOSCOPY;  Surgeon: Meryl Dare, MD,FACG;  Location: [facility] ENDOSCOPY;  Service: Endoscopy;  Laterality: N/A;   COLONOSCOPY  08/19/2012   Procedure: COLONOSCOPY;  Surgeon: Iva Boop, MD;  Location: [facility] ENDOS[facility];  Service: Endoscopy;  Laterality: N/A;   COLONOSCOPY N/A 03/11/2013   Procedure: COLONOSCOPY;  Surgeon: Hilarie Fredrickson, MD;  Location: [facility] ENDOS[facility];  Service: Endoscopy;  Laterality: N/A;   COLONOSCOPY N/A 01/16/2021   Procedure: COLONOSCOPY;  Surgeon: Sherrilyn Rist, MD;  Location: [facility]OSCOPY;  Service: Gastroenterology;  Laterality: N/A;   COLONOSCOPY WITH PROPOFOL N/A 01/10/2021   Procedure: COLONOSCOPY WITH PROPOFOL;  Surgeon: Hilarie Fredrickson, MD;  Location: [facility];  Service: Endoscopy;  Laterality: N/A;   COLOSTOMY N/A 01/15/2021   Procedure: COLOSTOMY;  Surgeon: Harriette Bouillon, MD;  Location: [facility];  Service: General;  Laterality: N/A;   ESOPHAGOGASTRODUODENOSCOPY (EGD) WITH PROPOFOL N/A 01/14/2021   Procedure: ESOPHAGOGASTRODUODENOSCOPY (EGD) WITH PROPOFOL;  Surgeon: Sherrilyn Rist, MD;  Location: [facility] END[facility];  Service: Endoscopy;  Laterality: N/A;   FIDUCIAL MARKER PLACEMENT  02/14/2023  Procedure: FIDUCIAL MARKER PLACEMENT;  Surgeon: Leslye Peer, MD;  Location: [facility] ENDOSCOPY;  Service: Pulmonary;;   FLEXIBLE SIGMOIDOSCOPY N/A 01/14/2021   Procedure: Arnell Sieving;  Surgeon: Sherrilyn Rist, MD;  Location: [facility];  Service: Endoscopy;  Laterality: N/A;   HOT HEMOSTASIS N/A 01/10/2021   Procedure: HOT HEMOSTASIS (ARGON PLASMA  COAGULATION/BICAP);  Surgeon: Hilarie Fredrickson, MD;  Location: [facility];  Service: Endoscopy;  Laterality: N/A;   IR ANGIOGRAM SELECTIVE EACH ADDITIONAL VESSEL  01/12/2021   IR ANGIOGRAM SELECTIVE EACH ADDITIONAL VESSEL  01/12/2021   IR ANGIOGRAM SELECTIVE EACH ADDITIONAL VESSEL  01/12/2021   IR ANGIOGRAM VISCERAL SELECTIVE  01/09/2021   IR ANGIOGRAM VISCERAL SELECTIVE  01/12/2021   IR EMBO ARTERIAL NOT HEMORR HEMANG INC GUIDE ROADMAPPING  01/12/2021   IR US GUIDE VASC ACCESS RIGHT  01/09/2021   IR US GUIDE VASC ACCESS RIGHT  01/12/2021   PARTIAL COLECTOMY N/A 01/15/2021   Procedure: PARTIAL COLECTOMY;  Surgeon: Harriette Bouillon, MD;  Location: [facility];  Service: General;  Laterality: N/A;   POLYPECTOMY  01/10/2021   Procedure: POLYPECTOMY;  Surgeon: Hilarie Fredrickson, MD;  Location: [facility] ENDOSCOPY;  Service: Endoscopy;;   SKIN CANCER EXCISION Right 12/2022   Right Leg above knee   TONSILLECTOMY     VIDEO BRONCHOSCOPY WITH ENDOBRONCHIAL ULTRASOUND Bilateral 02/14/2023   Procedure: VIDEO BRONCHOSCOPY WITH ENDOBRONCHIAL ULTRASOUND;  Surgeon: Leslye Peer, MD;  Location: [facility] ENDOSCOPY;  Service: Pulmonary;  Laterality: Bilateral;   VIDEO BRONCHOSCOPY WITH RADIAL ENDOBRONCHIAL ULTRASOUND  02/14/2023   Procedure: VIDEO BRONCHOSCOPY WITH RADIAL ENDOBRONCHIAL ULTRASOUND;  Surgeon: Leslye Peer, MD;  Location: [facility] ENDOSCOPY;  Service: Pulmonary;;    Family History  Problem Relation Age of Onset   Breast cancer Mother    Lung cancer Father    Breast cancer Sister    Anuerysm Brother        in stomach   Colon cancer Neg Hx    Esophageal cancer Neg Hx    Rectal cancer Neg Hx     Social History:  reports that she has been smoking cigarettes. She has a 59 pack-year smoking history. She has never used smokeless tobacco. She reports that she does not drink alcohol and does not use drugs.  Allergies: No Known Allergies  Medications: I have reviewed the patient's current medications.  Results for  orders placed or performed during the hospital encounter of 07/18/23 (from the past 48 hour(s))  Comprehensive metabolic panel     Status: Abnormal   Collection Time: 07/18/23 10:35 AM  Result Value Ref Range   Sodium 129 (L) 135 - 145 mmol/L   Potassium 4.3 3.5 - 5.1 mmol/L   Chloride 93 (L) 98 - 111 mmol/L   CO2 21 (L) 22 - 32 mmol/L   Glucose, Bld 152 (H) 70 - 99 mg/dL    Comment: Glucose reference range applies only to samples taken after fasting for at least 8 hours.   BUN 32 (H) 8 - 23 mg/dL   Creatinine, Ser 1.61 (H) 0.44 - 1.00 mg/dL   Calcium 9.1 8.9 - 09.6 mg/dL   Total Protein 7.8 6.5 - 8.1 g/dL   Albumin 4.2 3.5 - 5.0 g/dL   AST 61 (H) 15 - 41 U/L   ALT 45 (H) 0 - 44 U/L   Alkaline Phosphatase 70 38 - 126 U/L   Total Bilirubin 1.5 (H) 0.3 - 1.2 mg/dL   GFR, Estimated 27 (L) >60 mL/min    Comment: (  NOTE) Calculated using the CKD-EPI Creatinine Equation (2021)    Anion gap 15 5 - 15    Comment: Performed at [facility], [address] [place], [place] [address]  CBC with Differential     Status: Abnormal   Collection Time: 07/18/23 10:35 AM  Result Value Ref Range   WBC 5.9 4.0 - 10.5 K/uL   RBC 4.15 3.87 - 5.11 MIL/uL   Hemoglobin 13.5 12.0 - 15.0 g/dL   HCT 44.0 34.7 - 42.5 %   MCV 95.9 80.0 - 100.0 fL   MCH 32.5 26.0 - 34.0 pg   MCHC 33.9 30.0 - 36.0 g/dL   RDW 95.6 38.7 - 56.4 %   Platelets 276 150 - 400 K/uL   nRBC 0.0 0.0 - 0.2 %   Neutrophils Relative % 73 %   Neutro Abs 4.3 1.7 - 7.7 K/uL   Lymphocytes Relative 9 %   Lymphs Abs 0.5 (L) 0.7 - 4.0 K/uL   Monocytes Relative 16 %   Monocytes Absolute 1.0 0.1 - 1.0 K/uL   Eosinophils Relative 1 %   Eosinophils Absolute 0.1 0.0 - 0.5 K/uL   Basophils Relative 1 %   Basophils Absolute 0.0 0.0 - 0.1 K/uL   Immature Granulocytes 0 %   Abs Immature Granulocytes 0.01 0.00 - 0.07 K/uL    Comment: Performed at [facility], [address], [place], [place] [address]     CT ABDOMEN PELVIS WO CONTRAST  Result Date: 07/18/2023 CLINICAL DATA:  Left lower quadrant pain. Vomiting. Previous colectomy. Lung carcinoma. * Tracking Code: BO * EXAM: CT ABDOMEN AND PELVIS WITHOUT CONTRAST TECHNIQUE: Multidetector CT imaging of the abdomen and pelvis was performed following the standard protocol without IV contrast. RADIATION DOSE REDUCTION: This exam was performed according to the departmental dose-optimization program which includes automated exposure control, adjustment of the mA and/or kV according to patient size and/or use of iterative reconstruction technique. COMPARISON:  PET-CT on 01/06/2023 FINDINGS: Lower chest: No acute findings. Hepatobiliary:  No mass visualized on this unenhanced exam. Pancreas: No mass or inflammatory process visualized on this unenhanced exam. Spleen:  Within normal limits in size. Adrenals/Urinary tract: No evidence of urolithiasis or hydronephrosis. Unremarkable unopacified urinary bladder. Stomach/Bowel: Postop changes are seen from previous colectomy with right lower quadrant ileostomy. Moderately dilated small bowel loops are seen containing air-fluid levels, with feces sign in the right abdomen. Transition point is seen in the right abdomen, with nondilated distal small bowel proximal to the ileostomy. No mass or inflammatory process identified, and these findings are suspicious for adhesion. No evidence of free intraperitoneal air or abnormal fluid collections. Vascular/Lymphatic: No pathologically enlarged lymph nodes identified. No evidence of abdominal aortic aneurysm. Reproductive:  No mass or other significant abnormality. Other:  None. Musculoskeletal: No suspicious bone lesions identified. Bilateral L5 pars defects incidentally noted, with grade 1 anterolisthesis at L5-S1. IMPRESSION: Previous subtotal colectomy with right lower quadrant ileostomy. Distal small bowel obstruction, with transition point in the right abdomen suspicious for  adhesion. Electronically Signed   By: Danae Orleans M.D.   On: 07/18/2023 12:10    Review of Systems  Constitutional:  Positive for fatigue.  Gastrointestinal:  Positive for abdominal distention, abdominal pain, nausea and vomiting.  Musculoskeletal:  Positive for arthralgias and back pain.  All other systems reviewed and are negative.  Blood pressure (!) 95/55, pulse 86, temperature 98.4 F (36.9 C), temperature source Oral, resp. rate 16, height 5' 1.5" (1.562 m), weight  40.8 kg, SpO2 99%. Physical Exam HENT:     Head: Normocephalic.  Pulmonary:     Effort: Pulmonary effort is normal.  Abdominal:     General: There is distension.     Palpations: Abdomen is soft.     Tenderness: There is generalized abdominal tenderness.       Comments: Left lower quadrant ileostomy with a small amount of enteric contents.  Midline scar noted.  No signs of peritonitis.  Neurological:     General: No focal deficit present.     Mental Status: She is alert.  Psychiatric:        Mood and Affect: Mood normal.     Assessment/Plan: Small bowel obstruction  NG tube, small bowel protocol, bowel rest, IV fluids and observation for now.  Hopefully this will resolve with conservative management.  No acute signs of bowel compromise or peritonitis.  Will follow along.  Clovis Pu Mareesa Gathright MD 07/18/2023, 2:54 PM     High complexity

## 2023-07-19 ENCOUNTER — Inpatient Hospital Stay (HOSPITAL_COMMUNITY): Payer: Medicare Other

## 2023-07-19 DIAGNOSIS — L8990 Pressure ulcer of unspecified site, unspecified stage: Secondary | ICD-10-CM

## 2023-07-19 DIAGNOSIS — E871 Hypo-osmolality and hyponatremia: Secondary | ICD-10-CM | POA: Insufficient documentation

## 2023-07-19 DIAGNOSIS — K56609 Unspecified intestinal obstruction, unspecified as to partial versus complete obstruction: Secondary | ICD-10-CM | POA: Diagnosis not present

## 2023-07-19 HISTORY — DX: Pressure ulcer of unspecified site, unspecified stage: L89.90

## 2023-07-19 HISTORY — DX: Hypo-osmolality and hyponatremia: E87.1

## 2023-07-19 LAB — CBC
HCT: 34.4 % — ABNORMAL LOW (ref 36.0–46.0)
Hemoglobin: 11.7 g/dL — ABNORMAL LOW (ref 12.0–15.0)
MCH: 33.1 pg (ref 26.0–34.0)
MCHC: 34 g/dL (ref 30.0–36.0)
MCV: 97.2 fL (ref 80.0–100.0)
Platelets: 239 10*3/uL (ref 150–400)
RBC: 3.54 MIL/uL — ABNORMAL LOW (ref 3.87–5.11)
RDW: 13.8 % (ref 11.5–15.5)
WBC: 5.7 10*3/uL (ref 4.0–10.5)
nRBC: 0 % (ref 0.0–0.2)

## 2023-07-19 LAB — BASIC METABOLIC PANEL
Anion gap: 11 (ref 5–15)
BUN: 34 mg/dL — ABNORMAL HIGH (ref 8–23)
CO2: 22 mmol/L (ref 22–32)
Calcium: 8.7 mg/dL — ABNORMAL LOW (ref 8.9–10.3)
Chloride: 103 mmol/L (ref 98–111)
Creatinine, Ser: 1.11 mg/dL — ABNORMAL HIGH (ref 0.44–1.00)
GFR, Estimated: 48 mL/min — ABNORMAL LOW (ref >60)
Glucose, Bld: 88 mg/dL (ref 70–99)
Potassium: 3.5 mmol/L (ref 3.5–5.1)
Sodium: 136 mmol/L (ref 135–145)

## 2023-07-19 NOTE — TOC Transition Note (Signed)
Transition of Care [facility]) - CM/SW Discharge Note   Patient Details  Name: Kathleen Pope MRN: [id number] Date of Birth: [birth year]  [facility]) CM/SW Contact:  Amada Jupiter, LCSW Phone Number: 07/19/2023, 3:47 PM   Clinical Narrative:     TOC referral placed to assist with possible HH/ DME/ discharge needs.  Met with pt who confirms that she lives alone with a nephew who checks in with her regularly.  She is completely independent, driving and managing her home and all financial affairs.  Pt is independent with ostomy care.  Pt denies any concerns with home discharge today and no HH recommended.  TOC will sign off.  Final next level of care: Home/Self Care Barriers to Discharge: No Barriers Identified   Patient Goals and CMS Choice      Discharge Placement                         Discharge Plan and Services Additional resources added to the After Visit Summary for                  DME Arranged: N/A DME Agency: NA                  Social Determinants of Health (SDOH) Interventions SDOH Screenings   Food Insecurity: No Food Insecurity (07/18/2023)  Housing: Low Risk  (07/18/2023)  Transportation Needs: No Transportation Needs (07/18/2023)  Utilities: Not At Risk (07/18/2023)  Depression (PHQ2-9): Low Risk  (01/03/2023)  Tobacco Use: High Risk (07/18/2023)     Readmission Risk Interventions    07/19/2023    3:46 PM 01/25/2021   12:06 PM  Readmission Risk Prevention Plan  Post Dischage Appt Complete   Medication Screening Complete   Transportation Screening Complete Complete  HRI or Home Care Consult  Complete  Social Work Consult for Recovery Care Planning/Counseling  Complete  Palliative Care Screening  Not Applicable  Medication Review [employer])  Complete

## 2023-07-19 NOTE — Hospital Course (Signed)
Kathleen Pope is a 89 y.o. female with a history of tobacco abuse, lung cancer status postradiation, COPD, GI bleeding status post total colectomy with ileostomy, hypertension.  Patient presented secondary to abdominal pain, nausea, vomiting and was found to have evidence of a small bowel obstruction with right abdomen transition point.  NG tube was placed.  General surgery is consulted for management.  Patient managed conservatively with NG tube with quick removal.  Diet was advanced successfully with patient not having return of symptoms.  General surgery recommendations for discharge.

## 2023-07-19 NOTE — Discharge Summary (Addendum)
Physician Discharge Summary   Patient: Kathleen Pope MRN: [id number] DOB: 30-Aug-1933  Admit date:     07/18/2023  Discharge date: 07/19/23  Discharge Physician: Jacquelin Hawking, MD   PCP: Elfredia Nevins, MD   Recommendations at discharge:  PCP follow-up Soft diet  Discharge Diagnoses: Principal Problem:   SBO (small bowel obstruction) (HCC) Active Problems:   HTN (hypertension)   Ileostomy in place [facility])   Hyponatremia   Pressure injury of skin  Resolved Problems:   * No resolved hospital problems. *  Hospital Course: Kathleen Pope is a 89 y.o. female with a history of tobacco abuse, lung cancer status postradiation, COPD, GI bleeding status post total colectomy with ileostomy, hypertension.  Patient presented secondary to abdominal pain, nausea, vomiting and was found to have evidence of a small bowel obstruction with right abdomen transition point.  NG tube was placed.  General surgery is consulted for management.  Patient managed conservatively with NG tube with quick removal.  Diet was advanced successfully with patient not having return of symptoms.  General surgery recommendations for discharge.  Assessment and Plan:   Small bowel obstruction Transition point noted in the right abdomen with suspicion for adhesions.  General surgery consulted.  Patient made n.p.o. with NG tube placed to low intermittent suction.  General surgery evaluated with quick removal of NG tube and advancement of diet as patient improved quicker than expected.  Patient tolerated soft diet without worsening ileostomy output or nausea or vomiting.  Per general surgery, patient is stable for discharge once meeting those with criteria.  Patient discharged home.  Primary hypertension Patient with hypotension on admission.  Home antihypertensives were held.  Patient still with low normal blood pressure prior to discharge.  Will discontinue home amlodipine and losartan on discharge.  Resume home metoprolol tartrate  on discharge.  AKI Secondary to dehydration.  Creatinine of 1.80 on admission with improvement to 1.11 prior to discharge.  Hyponatremia Mild.  Likely secondary to hypovolemia from nausea and vomiting.  Patient given IV fluids with resolution of hyponatremia prior to discharge.  Sodium of 136 on discharge.  Pressure injury Sacrum.  Present on admission.   Consultants: General Surgery Procedures performed: NG tube placement/removal Disposition: Home Diet recommendation: Low fiber diet   DISCHARGE MEDICATION: Allergies as of 07/19/2023   No Known Allergies      Medication List     STOP taking these medications    amLODipine 5 MG tablet Commonly known as: NORVASC   ibuprofen 200 MG tablet Commonly known as: ADVIL   losartan 50 MG tablet Commonly known as: COZAAR       TAKE these medications    acetaminophen 500 MG tablet Commonly known as: TYLENOL Take 2 tablets (1,000 mg total) by mouth every 6 (six) hours as needed for moderate pain or mild pain.   ECHINACEA PO Take 167 mg by mouth daily.   Ferrous Sulfate 27 MG Tabs Take 54 mg by mouth daily with breakfast.   Fish Oil 1000 MG Caps Take 1,000 Units by mouth daily.   fluticasone-salmeterol 100-50 MCG/ACT Aepb Commonly known as: ADVAIR Inhale 1 puff into the lungs 2 (two) times daily.   metoprolol tartrate 25 MG tablet Commonly known as: LOPRESSOR Take 0.5 tablets (12.5 mg total) by mouth 2 (two) times daily.   multivitamin with minerals tablet Take 1 tablet by mouth daily. Centrum Silver   neomycin-polymyxin-pramoxine 1 % cream Commonly known as: NEOSPORIN PLUS Apply 1 Application topically daily.  Potassium 99 MG Tabs Take by mouth.   pravastatin 10 MG tablet Commonly known as: PRAVACHOL Take 10 mg by mouth daily.        Follow-up Information     Elfredia Nevins, MD. Schedule an appointment as soon as possible for a visit in 1 week(s).   Specialty: Internal Medicine Why: For hospital  follow-up Contact information: [address] [place] [address] [phone]                Discharge Exam: BP 115/68 (BP Location: Left Arm)   Pulse 98   Temp 98.6 F (37 C) (Oral)   Resp 18   Ht 5' 1.5" (1.562 m)   Wt 40.8 kg   SpO2 99%   BMI 16.73 kg/m   General exam: Appears calm and comfortable Respiratory system: Clear to auscultation. Respiratory effort normal. Cardiovascular system: S1 & S2 heard, RRR.  2 out of 6 systolic murmur Gastrointestinal system: Abdomen is nondistended, soft and nontender. Normal bowel sounds heard.  Ostomy bag with green stool. Central nervous system: Alert and oriented. No focal neurological deficits. Musculoskeletal: No edema. No calf tenderness Skin: No cyanosis. No rashes Psychiatry: Judgement and insight appear normal. Mood & affect appropriate.   Condition at discharge: stable  The results of significant diagnostics from this hospitalization (including imaging, microbiology, ancillary and laboratory) are listed below for reference.   Imaging Studies: DG Abd Portable 1V-Small Bowel Obstruction Protocol-initial, 8 hr delay  Result Date: 07/19/2023 CLINICAL DATA:  Small bowel obstruction EXAM: PORTABLE ABDOMEN - 1 VIEW COMPARISON:  07/18/2023 KUB and CT. FINDINGS: NG tube has been advanced and coils in the stomach. Dilated small bowel loops in the left abdomen. Small amount of contrast material is seen in the left abdomen and midline of the pelvis, unknown exact location. No organomegaly or free air. IMPRESSION: NG tube advanced into the stomach. Dilated left abdominal and pelvic small bowel loops compatible with small bowel obstruction. Electronically Signed   By: Charlett Nose M.D.   On: 07/19/2023 01:57   DG Abd Portable 1V  Result Date: 07/18/2023 CLINICAL DATA:  Vomiting. [id number] Encounter for imaging study to confirm nasogastric (NG) tube placement [id number] EXAM: PORTABLE ABDOMEN - 1 VIEW COMPARISON:  Same day CT FINDINGS:  Limited radiograph of the lower chest and upper abdomen was obtained for the purposes of enteric tube localization. Enteric tube is seen coursing below the diaphragm with distal tip at the level of the GE junction and side port in the distal esophagus. There are dilated loops of small bowel within the upper abdomen. IMPRESSION: Enteric tube is seen coursing below the diaphragm with distal tip at the level of the GE junction. Recommend advancement at least 10 cm. Electronically Signed   By: Duanne Guess D.O.   On: 07/18/2023 15:33   CT ABDOMEN PELVIS WO CONTRAST  Result Date: 07/18/2023 CLINICAL DATA:  Left lower quadrant pain. Vomiting. Previous colectomy. Lung carcinoma. * Tracking Code: BO * EXAM: CT ABDOMEN AND PELVIS WITHOUT CONTRAST TECHNIQUE: Multidetector CT imaging of the abdomen and pelvis was performed following the standard protocol without IV contrast. RADIATION DOSE REDUCTION: This exam was performed according to the departmental dose-optimization program which includes automated exposure control, adjustment of the mA and/or kV according to patient size and/or use of iterative reconstruction technique. COMPARISON:  PET-CT on 01/06/2023 FINDINGS: Lower chest: No acute findings. Hepatobiliary:  No mass visualized on this unenhanced exam. Pancreas: No mass or inflammatory process visualized on this unenhanced exam. Spleen:  Within normal limits in size. Adrenals/Urinary tract: No evidence of urolithiasis or hydronephrosis. Unremarkable unopacified urinary bladder. Stomach/Bowel: Postop changes are seen from previous colectomy with right lower quadrant ileostomy. Moderately dilated small bowel loops are seen containing air-fluid levels, with feces sign in the right abdomen. Transition point is seen in the right abdomen, with nondilated distal small bowel proximal to the ileostomy. No mass or inflammatory process identified, and these findings are suspicious for adhesion. No evidence of free  intraperitoneal air or abnormal fluid collections. Vascular/Lymphatic: No pathologically enlarged lymph nodes identified. No evidence of abdominal aortic aneurysm. Reproductive:  No mass or other significant abnormality. Other:  None. Musculoskeletal: No suspicious bone lesions identified. Bilateral L5 pars defects incidentally noted, with grade 1 anterolisthesis at L5-S1. IMPRESSION: Previous subtotal colectomy with right lower quadrant ileostomy. Distal small bowel obstruction, with transition point in the right abdomen suspicious for adhesion. Electronically Signed   By: Danae Orleans M.D.   On: 07/18/2023 12:10   CT Chest W Contrast  Result Date: 07/02/2023 CLINICAL DATA:  Right lung cancer, status post radiation EXAM: CT CHEST WITH CONTRAST TECHNIQUE: Multidetector CT imaging of the chest was performed during intravenous contrast administration. RADIATION DOSE REDUCTION: This exam was performed according to the departmental dose-optimization program which includes automated exposure control, adjustment of the mA and/or kV according to patient size and/or use of iterative reconstruction technique. CONTRAST:  75mL OMNIPAQUE IOHEXOL 300 MG/ML  SOLN COMPARISON:  PET-CT dated 01/04/2023.  CT chest dated 12/28/2022. FINDINGS: Cardiovascular: The heart is top-normal in size. No pericardial effusion. No evidence of thoracic aneurysm. Atherosclerotic calcifications of the aortic root/arch. Moderate three-vessel coronary atherosclerosis. Mediastinum/Nodes: 8 mm short axis low right paratracheal node, unchanged, non FDG avid on prior PET. Visualized thyroid is unremarkable. Lungs/Pleura: Cystic/solid nodule in the anterior right lung, straddling the fissure and favored to be predominantly in the right middle lobe, with dominant 1.4 x 2.3 cm solid component, previously 1.8 x 2.6 cm. Adjacent surgical clips/fiducial marker with surrounding radiation changes. Moderate centrilobular emphysematous changes, upper lung  predominant. No focal consolidation.  Mild scarring in the right lower lung. No pleural effusion or pneumothorax. Upper Abdomen: Visualized upper abdomen is grossly unremarkable, noting vascular calcifications. Musculoskeletal: Mild degenerative changes of the visualized thoracolumbar spine. IMPRESSION: Improving anterior right lung nodule centered in the right middle lobe, as described above. Surrounding radiation changes. No evidence of metastatic disease in the chest. Aortic Atherosclerosis (ICD10-I70.0) and Emphysema (ICD10-J43.9). Electronically Signed   By: Charline Bills M.D.   On: 07/02/2023 01:15    Microbiology: Results for orders placed or performed during the hospital encounter of 02/14/23  SARS Coronavirus 2 by RT PCR (hospital order, performed in [facility] hospital lab) *cepheid single result test* Anterior Nasal Swab     Status: None   Collection Time: 02/14/23  8:09 AM   Specimen: Anterior Nasal Swab  Result Value Ref Range Status   SARS Coronavirus 2 by RT PCR NEGATIVE NEGATIVE Final    Comment: Performed at [facility], [address], [place], [place] [address]    Labs: CBC: Recent Labs  Lab 07/18/23 1035 07/19/23 0435  WBC 5.9 5.7  NEUTROABS 4.3  --   HGB 13.5 11.7*  HCT 39.8 34.4*  MCV 95.9 97.2  PLT 276 239   Basic Metabolic Panel: Recent Labs  Lab 07/18/23 1035 07/19/23 0435  NA 129* 136  K 4.3 3.5  CL 93* 103  CO2 21* 22  GLUCOSE 152* 88  BUN 32* 34*  CREATININE 1.80* 1.11*  CALCIUM 9.1 8.7*   Liver Function Tests: Recent Labs  Lab 07/18/23 1035  AST 61*  ALT 45*  ALKPHOS 70  BILITOT 1.5*  PROT 7.8  ALBUMIN 4.2    Discharge time spent: 35 minutes.  Signed: Jacquelin Hawking, MD [facility] 07/19/2023

## 2023-07-19 NOTE — Progress Notes (Signed)
Subjective:  Has had significant output from her ileostomy. Reports that her pain and nausea have resolved. Denies current complaints.   ROS: See above, otherwise other systems negative  Objective: Vital signs in last 24 hours: Temp:  [97.7 F (36.5 C)-98.6 F (37 C)] 97.7 F (36.5 C) (09/03 0552) Pulse Rate:  [83-103] 89 (09/03 0552) Resp:  [15-18] 15 (09/03 0552) BP: (88-105)/(54-67) 104/60 (09/03 0552) SpO2:  [96 %-100 %] 96 % (09/03 0552) Weight:  [40.8 kg] 40.8 kg (09/02 1008) Last BM Date : 07/18/23  Intake/Output from previous day: 09/02 0701 - 09/03 0700 In: 3284.9 [I.V.:1575.9; NG/GT:210; IV Piggyback:1499] Out: 3950 [Urine:600; Emesis/NG output:900; Stool:2450] Intake/Output this shift: Total I/O In: -  Out: 250 [Urine:100; Stool:150]  PE: Gen: elderly female, NAD Resp: equal chest rise CV: RRR Abd: soft, non-distended, non-tender, liquid stool and gas in the ostomy bag  Lab Results:  Recent Labs    07/18/23 1035 07/19/23 0435  WBC 5.9 5.7  HGB 13.5 11.7*  HCT 39.8 34.4*  PLT 276 239   BMET Recent Labs    07/18/23 1035 07/19/23 0435  NA 129* 136  K 4.3 3.5  CL 93* 103  CO2 21* 22  GLUCOSE 152* 88  BUN 32* 34*  CREATININE 1.80* 1.11*  CALCIUM 9.1 8.7*   PT/INR No results for input(s): "LABPROT", "INR" in the last 72 hours. CMP     Component Value Date/Time   NA 136 07/19/2023 0435   K 3.5 07/19/2023 0435   CL 103 07/19/2023 0435   CO2 22 07/19/2023 0435   GLUCOSE 88 07/19/2023 0435   BUN 34 (H) 07/19/2023 0435   CREATININE 1.11 (H) 07/19/2023 0435   CALCIUM 8.7 (L) 07/19/2023 0435   PROT 7.8 07/18/2023 1035   ALBUMIN 4.2 07/18/2023 1035   AST 61 (H) 07/18/2023 1035   ALT 45 (H) 07/18/2023 1035   ALKPHOS 70 07/18/2023 1035   BILITOT 1.5 (H) 07/18/2023 1035   GFRNONAA 48 (L) 07/19/2023 0435   GFRAA >90 03/10/2013 0535   Lipase  No results found for: "LIPASE"  Studies/Results: DG Abd Portable 1V-Small Bowel  Obstruction Protocol-initial, 8 hr delay  Result Date: 07/19/2023 CLINICAL DATA:  Small bowel obstruction EXAM: PORTABLE ABDOMEN - 1 VIEW COMPARISON:  07/18/2023 KUB and CT. FINDINGS: NG tube has been advanced and coils in the stomach. Dilated small bowel loops in the left abdomen. Small amount of contrast material is seen in the left abdomen and midline of the pelvis, unknown exact location. No organomegaly or free air. IMPRESSION: NG tube advanced into the stomach. Dilated left abdominal and pelvic small bowel loops compatible with small bowel obstruction. Electronically Signed   By: Charlett Nose M.D.   On: 07/19/2023 01:57   DG Abd Portable 1V  Result Date: 07/18/2023 CLINICAL DATA:  Vomiting. [id number] Encounter for imaging study to confirm nasogastric (NG) tube placement [id number] EXAM: PORTABLE ABDOMEN - 1 VIEW COMPARISON:  Same day CT FINDINGS: Limited radiograph of the lower chest and upper abdomen was obtained for the purposes of enteric tube localization. Enteric tube is seen coursing below the diaphragm with distal tip at the level of the GE junction and side port in the distal esophagus. There are dilated loops of small bowel within the upper abdomen. IMPRESSION: Enteric tube is seen coursing below the diaphragm with distal tip at the level of the GE junction. Recommend advancement at least 10 cm. Electronically Signed   By: Duanne Guess D.O.  On: 07/18/2023 15:33   CT ABDOMEN PELVIS WO CONTRAST  Result Date: 07/18/2023 CLINICAL DATA:  Left lower quadrant pain. Vomiting. Previous colectomy. Lung carcinoma. * Tracking Code: BO * EXAM: CT ABDOMEN AND PELVIS WITHOUT CONTRAST TECHNIQUE: Multidetector CT imaging of the abdomen and pelvis was performed following the standard protocol without IV contrast. RADIATION DOSE REDUCTION: This exam was performed according to the departmental dose-optimization program which includes automated exposure control, adjustment of the mA and/or kV according to patient  size and/or use of iterative reconstruction technique. COMPARISON:  PET-CT on 01/06/2023 FINDINGS: Lower chest: No acute findings. Hepatobiliary:  No mass visualized on this unenhanced exam. Pancreas: No mass or inflammatory process visualized on this unenhanced exam. Spleen:  Within normal limits in size. Adrenals/Urinary tract: No evidence of urolithiasis or hydronephrosis. Unremarkable unopacified urinary bladder. Stomach/Bowel: Postop changes are seen from previous colectomy with right lower quadrant ileostomy. Moderately dilated small bowel loops are seen containing air-fluid levels, with feces sign in the right abdomen. Transition point is seen in the right abdomen, with nondilated distal small bowel proximal to the ileostomy. No mass or inflammatory process identified, and these findings are suspicious for adhesion. No evidence of free intraperitoneal air or abnormal fluid collections. Vascular/Lymphatic: No pathologically enlarged lymph nodes identified. No evidence of abdominal aortic aneurysm. Reproductive:  No mass or other significant abnormality. Other:  None. Musculoskeletal: No suspicious bone lesions identified. Bilateral L5 pars defects incidentally noted, with grade 1 anterolisthesis at L5-S1. IMPRESSION: Previous subtotal colectomy with right lower quadrant ileostomy. Distal small bowel obstruction, with transition point in the right abdomen suspicious for adhesion. Electronically Signed   By: Danae Orleans M.D.   On: 07/18/2023 12:10    Anti-infectives: Anti-infectives (From admission, onward)    None       Assessment/Plan  89 y/o F w/ a complicated surgical history who presented with decreased ileostomy output with CT showing pSBO   - NGT removed at bedside by surgery team - CLD started.  Okay to advance as tolerated - Monitor ileostomy output - Can potentially discharge later tonight versus tomorrow if she is tolerating PO without nausea and ostomy output <2L w/o signs of  dehydration  I reviewed last 24 h vitals and pain scores, last 48 h intake and output, last 24 h labs and trends, and last 24 h imaging results.  This care required moderate level of medical decision making.    LOS: 1 day   Tacy [facility] Surgery 07/19/2023, 9:51 AM Please see Amion for pager number during day hours 7:00am-4:30pm or 7:00am -11:30am on weekends

## 2023-07-19 NOTE — Progress Notes (Signed)
Mobility Specialist - Progress Note   07/19/23 0917  Mobility  Activity Ambulated with assistance in hallway  Level of Assistance Standby assist, set-up cues, supervision of patient - no hands on  Assistive Device Other (Comment) (IV Pole)  Distance Ambulated (ft) 200 ft  Range of Motion/Exercises Active  Activity Response Tolerated well  Mobility Referral Yes  $Mobility charge 1 Mobility  Mobility Specialist Start Time (ACUTE ONLY) [id number]  Mobility Specialist Stop Time (ACUTE ONLY) [id number]  Mobility Specialist Time Calculation (min) (ACUTE ONLY) 12 min   Pt was found in bed and agreeable to ambulate. Stated feeling a little weak. At EOS returned to recliner chair with all needs met. Call bell in reach and chair alarm on.   Billey Chang Mobility Specialist

## 2023-07-19 NOTE — Discharge Instructions (Signed)
Kathleen Pope,  You were in the hospital with a bowel obstruction. Thankfully this resolved quickly. The surgeon has cleared you for discharge.

## 2023-07-19 NOTE — Progress Notes (Signed)
Reviewed d/c instructions with pt. All questions answered. Pt taken by wheelchair to front entrance to meet ride.

## 2023-07-21 ENCOUNTER — Ambulatory Visit (HOSPITAL_COMMUNITY)
Admission: RE | Admit: 2023-07-21 | Discharge: 2023-07-21 | Disposition: A | Discharge status: Home / Self Care | Payer: Medicare Other | Source: Ambulatory Visit | Attending: Internal Medicine | Admitting: Internal Medicine

## 2023-07-21 ENCOUNTER — Other Ambulatory Visit (HOSPITAL_COMMUNITY): Payer: Self-pay | Admitting: Internal Medicine

## 2023-07-21 DIAGNOSIS — G8929 Other chronic pain: Secondary | ICD-10-CM

## 2023-07-21 DIAGNOSIS — Z932 Ileostomy status: Secondary | ICD-10-CM | POA: Diagnosis not present

## 2023-07-21 DIAGNOSIS — R109 Unspecified abdominal pain: Secondary | ICD-10-CM | POA: Diagnosis not present

## 2023-07-21 DIAGNOSIS — I10 Essential (primary) hypertension: Secondary | ICD-10-CM | POA: Diagnosis not present

## 2023-07-21 DIAGNOSIS — I350 Nonrheumatic aortic (valve) stenosis: Secondary | ICD-10-CM | POA: Diagnosis not present

## 2023-07-21 DIAGNOSIS — K56609 Unspecified intestinal obstruction, unspecified as to partial versus complete obstruction: Secondary | ICD-10-CM | POA: Insufficient documentation

## 2023-07-21 DIAGNOSIS — J449 Chronic obstructive pulmonary disease, unspecified: Secondary | ICD-10-CM | POA: Diagnosis not present

## 2023-07-21 DIAGNOSIS — Z681 Body mass index (BMI) 19 or less, adult: Secondary | ICD-10-CM | POA: Diagnosis not present

## 2023-08-30 ENCOUNTER — Emergency Department (HOSPITAL_COMMUNITY): Payer: Medicare Other

## 2023-08-30 ENCOUNTER — Inpatient Hospital Stay (HOSPITAL_COMMUNITY)
Admission: EM | Admit: 2023-08-30 | Discharge: 2023-09-03 | DRG: 521 | Disposition: A | Discharge status: Skilled Nursing Facility | Payer: Medicare Other | Attending: Internal Medicine | Admitting: Internal Medicine

## 2023-08-30 ENCOUNTER — Inpatient Hospital Stay (HOSPITAL_COMMUNITY): Payer: Medicare Other

## 2023-08-30 ENCOUNTER — Encounter (HOSPITAL_COMMUNITY): Payer: Self-pay

## 2023-08-30 DIAGNOSIS — J9601 Acute respiratory failure with hypoxia: Secondary | ICD-10-CM | POA: Diagnosis not present

## 2023-08-30 DIAGNOSIS — Z7951 Long term (current) use of inhaled steroids: Secondary | ICD-10-CM

## 2023-08-30 DIAGNOSIS — R000 Tachycardia, unspecified: Secondary | ICD-10-CM | POA: Diagnosis not present

## 2023-08-30 DIAGNOSIS — E869 Volume depletion, unspecified: Secondary | ICD-10-CM | POA: Diagnosis present

## 2023-08-30 DIAGNOSIS — W010XXA Fall on same level from slipping, tripping and stumbling without subsequent striking against object, initial encounter: Secondary | ICD-10-CM | POA: Diagnosis present

## 2023-08-30 DIAGNOSIS — J9811 Atelectasis: Secondary | ICD-10-CM | POA: Diagnosis not present

## 2023-08-30 DIAGNOSIS — R262 Difficulty in walking, not elsewhere classified: Secondary | ICD-10-CM | POA: Diagnosis not present

## 2023-08-30 DIAGNOSIS — R6520 Severe sepsis without septic shock: Secondary | ICD-10-CM | POA: Diagnosis not present

## 2023-08-30 DIAGNOSIS — R918 Other nonspecific abnormal finding of lung field: Secondary | ICD-10-CM | POA: Diagnosis not present

## 2023-08-30 DIAGNOSIS — E876 Hypokalemia: Secondary | ICD-10-CM | POA: Diagnosis not present

## 2023-08-30 DIAGNOSIS — D649 Anemia, unspecified: Secondary | ICD-10-CM | POA: Diagnosis not present

## 2023-08-30 DIAGNOSIS — E7800 Pure hypercholesterolemia, unspecified: Secondary | ICD-10-CM | POA: Diagnosis not present

## 2023-08-30 DIAGNOSIS — Z932 Ileostomy status: Secondary | ICD-10-CM | POA: Diagnosis not present

## 2023-08-30 DIAGNOSIS — S72142D Displaced intertrochanteric fracture of left femur, subsequent encounter for closed fracture with routine healing: Secondary | ICD-10-CM | POA: Diagnosis not present

## 2023-08-30 DIAGNOSIS — Y92000 Kitchen of unspecified non-institutional (private) residence as  the place of occurrence of the external cause: Secondary | ICD-10-CM | POA: Diagnosis not present

## 2023-08-30 DIAGNOSIS — I10 Essential (primary) hypertension: Secondary | ICD-10-CM | POA: Diagnosis present

## 2023-08-30 DIAGNOSIS — Z79899 Other long term (current) drug therapy: Secondary | ICD-10-CM

## 2023-08-30 DIAGNOSIS — Z432 Encounter for attention to ileostomy: Secondary | ICD-10-CM | POA: Diagnosis not present

## 2023-08-30 DIAGNOSIS — J440 Chronic obstructive pulmonary disease with acute lower respiratory infection: Secondary | ICD-10-CM | POA: Diagnosis present

## 2023-08-30 DIAGNOSIS — Z803 Family history of malignant neoplasm of breast: Secondary | ICD-10-CM | POA: Diagnosis not present

## 2023-08-30 DIAGNOSIS — Z7982 Long term (current) use of aspirin: Secondary | ICD-10-CM

## 2023-08-30 DIAGNOSIS — R9431 Abnormal electrocardiogram [ECG] [EKG]: Secondary | ICD-10-CM | POA: Diagnosis present

## 2023-08-30 DIAGNOSIS — J449 Chronic obstructive pulmonary disease, unspecified: Secondary | ICD-10-CM | POA: Diagnosis not present

## 2023-08-30 DIAGNOSIS — A419 Sepsis, unspecified organism: Secondary | ICD-10-CM

## 2023-08-30 DIAGNOSIS — C3491 Malignant neoplasm of unspecified part of right bronchus or lung: Secondary | ICD-10-CM | POA: Diagnosis present

## 2023-08-30 DIAGNOSIS — F17200 Nicotine dependence, unspecified, uncomplicated: Secondary | ICD-10-CM | POA: Diagnosis not present

## 2023-08-30 DIAGNOSIS — Z801 Family history of malignant neoplasm of trachea, bronchus and lung: Secondary | ICD-10-CM

## 2023-08-30 DIAGNOSIS — Z85828 Personal history of other malignant neoplasm of skin: Secondary | ICD-10-CM | POA: Diagnosis not present

## 2023-08-30 DIAGNOSIS — E871 Hypo-osmolality and hyponatremia: Secondary | ICD-10-CM | POA: Diagnosis not present

## 2023-08-30 DIAGNOSIS — Z9049 Acquired absence of other specified parts of digestive tract: Secondary | ICD-10-CM

## 2023-08-30 DIAGNOSIS — J189 Pneumonia, unspecified organism: Secondary | ICD-10-CM | POA: Diagnosis not present

## 2023-08-30 DIAGNOSIS — M25559 Pain in unspecified hip: Secondary | ICD-10-CM | POA: Diagnosis not present

## 2023-08-30 DIAGNOSIS — Z96642 Presence of left artificial hip joint: Secondary | ICD-10-CM | POA: Diagnosis not present

## 2023-08-30 DIAGNOSIS — Z9181 History of falling: Secondary | ICD-10-CM | POA: Diagnosis not present

## 2023-08-30 DIAGNOSIS — Z923 Personal history of irradiation: Secondary | ICD-10-CM | POA: Diagnosis not present

## 2023-08-30 DIAGNOSIS — J181 Lobar pneumonia, unspecified organism: Secondary | ICD-10-CM | POA: Diagnosis present

## 2023-08-30 DIAGNOSIS — Z85118 Personal history of other malignant neoplasm of bronchus and lung: Secondary | ICD-10-CM | POA: Diagnosis not present

## 2023-08-30 DIAGNOSIS — M6281 Muscle weakness (generalized): Secondary | ICD-10-CM | POA: Diagnosis not present

## 2023-08-30 DIAGNOSIS — I700 Atherosclerosis of aorta: Secondary | ICD-10-CM | POA: Diagnosis not present

## 2023-08-30 DIAGNOSIS — Z043 Encounter for examination and observation following other accident: Secondary | ICD-10-CM | POA: Diagnosis not present

## 2023-08-30 DIAGNOSIS — K5793 Diverticulitis of intestine, part unspecified, without perforation or abscess with bleeding: Secondary | ICD-10-CM | POA: Diagnosis not present

## 2023-08-30 DIAGNOSIS — S32501S Unspecified fracture of right pubis, sequela: Secondary | ICD-10-CM | POA: Diagnosis not present

## 2023-08-30 DIAGNOSIS — Z66 Do not resuscitate: Secondary | ICD-10-CM | POA: Diagnosis present

## 2023-08-30 DIAGNOSIS — M25552 Pain in left hip: Secondary | ICD-10-CM | POA: Diagnosis not present

## 2023-08-30 DIAGNOSIS — S72002A Fracture of unspecified part of neck of left femur, initial encounter for closed fracture: Principal | ICD-10-CM | POA: Diagnosis present

## 2023-08-30 DIAGNOSIS — K5790 Diverticulosis of intestine, part unspecified, without perforation or abscess without bleeding: Secondary | ICD-10-CM | POA: Diagnosis not present

## 2023-08-30 DIAGNOSIS — S72002D Fracture of unspecified part of neck of left femur, subsequent encounter for closed fracture with routine healing: Secondary | ICD-10-CM | POA: Diagnosis not present

## 2023-08-30 DIAGNOSIS — M8580 Other specified disorders of bone density and structure, unspecified site: Secondary | ICD-10-CM | POA: Diagnosis not present

## 2023-08-30 DIAGNOSIS — Z1152 Encounter for screening for COVID-19: Secondary | ICD-10-CM

## 2023-08-30 DIAGNOSIS — Z471 Aftercare following joint replacement surgery: Secondary | ICD-10-CM | POA: Diagnosis not present

## 2023-08-30 DIAGNOSIS — E785 Hyperlipidemia, unspecified: Secondary | ICD-10-CM | POA: Diagnosis not present

## 2023-08-30 DIAGNOSIS — S72002G Fracture of unspecified part of neck of left femur, subsequent encounter for closed fracture with delayed healing: Secondary | ICD-10-CM | POA: Diagnosis not present

## 2023-08-30 DIAGNOSIS — F17210 Nicotine dependence, cigarettes, uncomplicated: Secondary | ICD-10-CM | POA: Diagnosis present

## 2023-08-30 DIAGNOSIS — R488 Other symbolic dysfunctions: Secondary | ICD-10-CM | POA: Diagnosis not present

## 2023-08-30 DIAGNOSIS — M1611 Unilateral primary osteoarthritis, right hip: Secondary | ICD-10-CM | POA: Diagnosis not present

## 2023-08-30 DIAGNOSIS — M25551 Pain in right hip: Secondary | ICD-10-CM | POA: Diagnosis not present

## 2023-08-30 DIAGNOSIS — J90 Pleural effusion, not elsewhere classified: Secondary | ICD-10-CM | POA: Diagnosis not present

## 2023-08-30 DIAGNOSIS — W19XXXA Unspecified fall, initial encounter: Secondary | ICD-10-CM | POA: Diagnosis not present

## 2023-08-30 HISTORY — DX: Abnormal electrocardiogram (ECG) (EKG): R94.31

## 2023-08-30 HISTORY — DX: Sepsis, unspecified organism: R65.20

## 2023-08-30 HISTORY — DX: Severe sepsis without septic shock: A41.9

## 2023-08-30 LAB — LACTIC ACID, PLASMA: Lactic Acid, Venous: 1.2 mmol/L (ref 0.5–1.9)

## 2023-08-30 LAB — URINALYSIS, ROUTINE W REFLEX MICROSCOPIC
Bacteria, UA: NONE SEEN
Bilirubin Urine: NEGATIVE
Glucose, UA: 50 mg/dL — AB
Hgb urine dipstick: NEGATIVE
Ketones, ur: 5 mg/dL — AB
Leukocytes,Ua: NEGATIVE
Nitrite: NEGATIVE
Protein, ur: 30 mg/dL — AB
Specific Gravity, Urine: 1.014 (ref 1.005–1.030)
pH: 6 (ref 5.0–8.0)

## 2023-08-30 LAB — CBC WITH DIFFERENTIAL/PLATELET
Abs Immature Granulocytes: 0.1 10*3/uL — ABNORMAL HIGH (ref 0.00–0.07)
Basophils Absolute: 0 10*3/uL (ref 0.0–0.1)
Basophils Relative: 0 %
Eosinophils Absolute: 0.1 10*3/uL (ref 0.0–0.5)
Eosinophils Relative: 0 %
HCT: 35.5 % — ABNORMAL LOW (ref 36.0–46.0)
Hemoglobin: 12.4 g/dL (ref 12.0–15.0)
Immature Granulocytes: 1 %
Lymphocytes Relative: 5 %
Lymphs Abs: 0.7 10*3/uL (ref 0.7–4.0)
MCH: 33.6 pg (ref 26.0–34.0)
MCHC: 34.9 g/dL (ref 30.0–36.0)
MCV: 96.2 fL (ref 80.0–100.0)
Monocytes Absolute: 0.8 10*3/uL (ref 0.1–1.0)
Monocytes Relative: 5 %
Neutro Abs: 12.9 10*3/uL — ABNORMAL HIGH (ref 1.7–7.7)
Neutrophils Relative %: 89 %
Platelets: 321 10*3/uL (ref 150–400)
RBC: 3.69 MIL/uL — ABNORMAL LOW (ref 3.87–5.11)
RDW: 14.3 % (ref 11.5–15.5)
WBC: 14.6 10*3/uL — ABNORMAL HIGH (ref 4.0–10.5)
nRBC: 0 % (ref 0.0–0.2)

## 2023-08-30 LAB — BASIC METABOLIC PANEL
Anion gap: 12 (ref 5–15)
BUN: 11 mg/dL (ref 8–23)
CO2: 24 mmol/L (ref 22–32)
Calcium: 9 mg/dL (ref 8.9–10.3)
Chloride: 98 mmol/L (ref 98–111)
Creatinine, Ser: 0.75 mg/dL (ref 0.44–1.00)
GFR, Estimated: >60 mL/min (ref >60)
Glucose, Bld: 131 mg/dL — ABNORMAL HIGH (ref 70–99)
Potassium: 3.2 mmol/L — ABNORMAL LOW (ref 3.5–5.1)
Sodium: 134 mmol/L — ABNORMAL LOW (ref 135–145)

## 2023-08-30 LAB — MAGNESIUM: Magnesium: 1.9 mg/dL (ref 1.7–2.4)

## 2023-08-30 LAB — PROTIME-INR
INR: 0.9 (ref 0.8–1.2)
Prothrombin Time: 12.6 s (ref 11.4–15.2)

## 2023-08-30 MED ORDER — ACETAMINOPHEN 325 MG PO TABS
650.0000 mg | ORAL_TABLET | Freq: Four times a day (QID) | ORAL | Status: DC | PRN
  Administered 2023-08-31 – 2023-09-01 (×2): 650 mg via ORAL
  Filled 2023-08-30 (×2): qty 2

## 2023-08-30 MED ORDER — FENTANYL CITRATE PF 50 MCG/ML IJ SOSY
12.5000 ug | PREFILLED_SYRINGE | INTRAMUSCULAR | Status: DC | PRN
  Administered 2023-08-30 – 2023-09-01 (×5): 12.5 ug via INTRAVENOUS
  Filled 2023-08-30 (×7): qty 1

## 2023-08-30 MED ORDER — IPRATROPIUM-ALBUTEROL 0.5-2.5 (3) MG/3ML IN SOLN: 3.0000 mL | RESPIRATORY_TRACT | Status: DC | PRN

## 2023-08-30 MED ORDER — MOMETASONE FURO-FORMOTEROL FUM 100-5 MCG/ACT IN AERO
2.0000 | INHALATION_SPRAY | Freq: Two times a day (BID) | RESPIRATORY_TRACT | Status: DC
  Administered 2023-08-31 – 2023-09-03 (×7): 2 via RESPIRATORY_TRACT
  Filled 2023-08-30: qty 8.8

## 2023-08-30 MED ORDER — ONDANSETRON HCL 4 MG/2ML IJ SOLN
4.0000 mg | Freq: Once | INTRAMUSCULAR | Status: AC
  Administered 2023-08-30: 4 mg via INTRAVENOUS
  Filled 2023-08-30: qty 2

## 2023-08-30 MED ORDER — ACETAMINOPHEN 650 MG RE SUPP: 650.0000 mg | Freq: Four times a day (QID) | RECTAL | Status: DC | PRN

## 2023-08-30 MED ORDER — HEPARIN SODIUM (PORCINE) 5000 UNIT/ML IJ SOLN
5000.0000 [IU] | Freq: Once | INTRAMUSCULAR | Status: AC
  Administered 2023-08-30: 5000 [IU] via SUBCUTANEOUS
  Filled 2023-08-30: qty 1

## 2023-08-30 MED ORDER — KCL IN DEXTROSE-NACL 40-5-0.9 MEQ/L-%-% IV SOLN: INTRAVENOUS | Status: DC

## 2023-08-30 MED ORDER — PROMETHAZINE HCL 12.5 MG PO TABS
12.5000 mg | ORAL_TABLET | Freq: Four times a day (QID) | ORAL | Status: DC | PRN
  Administered 2023-09-02: 12.5 mg via ORAL
  Filled 2023-08-30: qty 1

## 2023-08-30 MED ORDER — METOPROLOL TARTRATE 25 MG PO TABS
12.5000 mg | ORAL_TABLET | Freq: Two times a day (BID) | ORAL | Status: DC
  Administered 2023-08-30 – 2023-09-03 (×7): 12.5 mg via ORAL
  Filled 2023-08-30 (×7): qty 1

## 2023-08-30 MED ORDER — MUPIROCIN 2 % EX OINT
1.0000 | TOPICAL_OINTMENT | Freq: Two times a day (BID) | CUTANEOUS | Status: DC
  Administered 2023-08-31 – 2023-09-03 (×7): 1 via NASAL
  Filled 2023-08-30 (×2): qty 22

## 2023-08-30 MED ORDER — SODIUM CHLORIDE 0.9 % IV SOLN
2.0000 g | INTRAVENOUS | Status: DC
  Administered 2023-08-30 – 2023-09-02 (×3): 2 g via INTRAVENOUS
  Filled 2023-08-30 (×3): qty 20

## 2023-08-30 MED ORDER — POTASSIUM CHLORIDE CRYS ER 20 MEQ PO TBCR
40.0000 meq | EXTENDED_RELEASE_TABLET | Freq: Once | ORAL | Status: AC
  Administered 2023-08-30: 40 meq via ORAL
  Filled 2023-08-30: qty 2

## 2023-08-30 MED ORDER — SODIUM CHLORIDE 0.9 % IV SOLN
100.0000 mg | Freq: Two times a day (BID) | INTRAVENOUS | Status: DC
  Administered 2023-08-31 – 2023-09-02 (×6): 100 mg via INTRAVENOUS
  Filled 2023-08-30 (×9): qty 100

## 2023-08-30 MED ORDER — HYDROMORPHONE HCL 1 MG/ML IJ SOLN
0.5000 mg | INTRAMUSCULAR | Status: DC | PRN
  Administered 2023-08-30: 0.5 mg via INTRAVENOUS
  Filled 2023-08-30: qty 0.5

## 2023-08-30 NOTE — ED Notes (Signed)
Lab reports that there are discrepancies with type and screen results. They have to [facility] to [facility]. Recommended that any need for blood transfusion should be emergency released.

## 2023-08-30 NOTE — Assessment & Plan Note (Signed)
Stable.  Resume home regimen 

## 2023-08-30 NOTE — Assessment & Plan Note (Addendum)
Meeting severe sepsis criteria, with tachycardia heart rate 97-106, leukocytosis of 14.6, and evidence of endorgan dysfunction hypoxia.  At this time likely from pneumonia. -Follow-up blood cultures, UA

## 2023-08-30 NOTE — ED Notes (Signed)
Pt found to be 84% on RA.  Pt placed on 2L [place] and increased to 90%.  Hx of COPD.

## 2023-08-30 NOTE — ED Provider Notes (Signed)
[place] EMERGENCY DEPARTMENT AT [facility] Provider Note   CSN: [id number] Arrival date & time: 08/30/23  1526     History  Chief Complaint  Patient presents with   Fall   Hip Pain    Kathleen Pope is a 89 y.o. female.  HPI 89 year old female presents after a fall and left hip injury.  She was unloading groceries and her feet got tangled and she fell and landed on her left leg.  Tried to stand up but then her leg buckled and she fell again.  Never hit her head.  No shortness of breath or cough beyond her baseline cough.  No weakness or numbness.  Home Medications Prior to Admission medications   Medication Sig Start Date End Date Taking? Authorizing Provider  acetaminophen (TYLENOL) 500 MG tablet Take 2 tablets (1,000 mg total) by mouth every 6 (six) hours as needed for moderate pain or mild pain. 02/14/23   Leslye Peer, MD  ECHINACEA PO Take 167 mg by mouth daily.    [provider]  Ferrous Sulfate 27 MG TABS Take 54 mg by mouth daily with breakfast.    [provider]  fluticasone-salmeterol (ADVAIR) 100-50 MCG/ACT AEPB Inhale 1 puff into the lungs 2 (two) times daily. 02/03/23   Luciano Cutter, MD  metoprolol tartrate (LOPRESSOR) 25 MG tablet Take 0.5 tablets (12.5 mg total) by mouth 2 (two) times daily. 01/25/21   Rai, Delene Ruffini, MD  Multiple Vitamins-Minerals (MULTIVITAMIN WITH MINERALS) tablet Take 1 tablet by mouth daily. Centrum Silver    [provider]  neomycin-polymyxin-pramoxine (NEOSPORIN PLUS) 1 % cream Apply 1 Application topically daily.    [provider]  Omega-3 Fatty Acids (FISH OIL) 1000 MG CAPS Take 1,000 Units by mouth daily.    [provider]  Potassium 99 MG TABS Take by mouth.    [provider]  pravastatin (PRAVACHOL) 10 MG tablet Take 10 mg by mouth daily.    [provider]      Allergies    Patient has no known allergies.    Review of Systems   Review of Systems   Musculoskeletal:  Positive for arthralgias.  Neurological:  Negative for weakness, numbness and headaches.    Physical Exam Updated Vital Signs BP 127/77   Pulse 100   Temp 97.8 F (36.6 C) (Oral)   Resp 17   Ht 5\' 1"  (1.549 m)   Wt 42 kg   SpO2 92%   BMI 17.50 kg/m  Physical Exam Vitals and nursing note reviewed.  Constitutional:      General: She is not in acute distress.    Appearance: She is well-developed. She is not ill-appearing or diaphoretic.  HENT:     Head: Normocephalic and atraumatic.  Cardiovascular:     Rate and Rhythm: Normal rate and regular rhythm.     Pulses:          Dorsalis pedis pulses are 2+ on the right side and 2+ on the left side.     Heart sounds: Murmur heard.  Pulmonary:     Effort: Pulmonary effort is normal.     Breath sounds: Normal breath sounds.  Abdominal:     General: There is no distension.     Palpations: Abdomen is soft.     Tenderness: There is no abdominal tenderness.  Musculoskeletal:     Left hip: Deformity and tenderness present. Decreased range of motion.     Left knee: No  tenderness.     Comments: Normal sensation and movement in left foot.  Skin:    General: Skin is warm and dry.  Neurological:     Mental Status: She is alert.     ED Results / Procedures / Treatments   Labs (all labs ordered are listed, but only abnormal results are displayed) Labs Reviewed  BASIC METABOLIC PANEL - Abnormal; Notable for the following components:      Result Value   Sodium 134 (*)    Potassium 3.2 (*)    Glucose, Bld 131 (*)    All other components within normal limits  CBC WITH DIFFERENTIAL/PLATELET - Abnormal; Notable for the following components:   WBC 14.6 (*)    RBC 3.69 (*)    HCT 35.5 (*)    Neutro Abs 12.9 (*)    Abs Immature Granulocytes 0.10 (*)    All other components within normal limits  PROTIME-INR  MAGNESIUM  TYPE AND SCREEN    EKG EKG Interpretation Date/Time:  Tuesday August 30 2023 16:33:17  EDT Ventricular Rate:  101 PR Interval:  189 QRS Duration:  90 QT Interval:  396 QTC Calculation: 514 R Axis:   35  Text Interpretation: Sinus tachycardia LVH with secondary repolarization abnormality ST depr, consider ischemia, inferior leads Prolonged QT interval ST changes similar to june 2023 Confirmed by Pricilla Loveless [phone]) on 08/30/2023 5:05:28 PM  Radiology DG Chest 1 View  Result Date: 08/30/2023 CLINICAL DATA:  Fall. EXAM: CHEST  1 VIEW COMPARISON:  July 21, 2023. FINDINGS: The heart size and mediastinal contours are within normal limits. Left lung is clear. Mild right perihilar atelectasis or infiltrate is noted. Small right pleural effusion is noted. The visualized skeletal structures are unremarkable. IMPRESSION: Mild right perihilar atelectasis or infiltrate is noted. Small right pleural effusion. Electronically Signed   By: Lupita Raider M.D.   On: 08/30/2023 18:45   DG Hip Unilat With Pelvis 2-3 Views Left  Result Date: 08/30/2023 CLINICAL DATA:  Left hip pain after fall. EXAM: DG HIP (WITH OR WITHOUT PELVIS) 2-3V LEFT COMPARISON:  None Available. FINDINGS: Moderately displaced proximal left femoral neck fracture is noted. IMPRESSION: Moderately displaced proximal left femoral neck fracture. Electronically Signed   By: Lupita Raider M.D.   On: 08/30/2023 18:43    Procedures Procedures    Medications Ordered in ED Medications  HYDROmorphone (DILAUDID) injection 0.5 mg (0.5 mg Intravenous Given 08/30/23 1724)  potassium chloride SA (KLOR-CON M) CR tablet 40 mEq (40 mEq Oral Given 08/30/23 1835)  ondansetron (ZOFRAN) injection 4 mg (4 mg Intravenous Given 08/30/23 1925)    ED Course/ Medical Decision Making/ A&P                                 Medical Decision Making Amount and/or Complexity of Data Reviewed Labs: ordered.    Details: Leukocytosis.  Mild hypokalemia Radiology: ordered.    Details: Left hip fracture. ECG/medicine tests: ordered and  independent interpretation performed.    Details: LVH, chronic ST changes.  Risk Prescription drug management. Decision regarding hospitalization.   Patient with what seems like a mechanical fall and then hip fracture.  Neurovascular intact.  Given some IV Dilaudid for pain.  She has a confirmed hip fracture on x-ray.  She has a history of lung cancer status post radiation.  X-ray shows infiltrate versus atelectasis but she has no new cough or fever to suggest  pneumonia.  Does have a leukocytosis.  For now we will hold off on antibiotics.  Discussed with Dr. Dallas Schimke, who will likely operate tomorrow. Discussed with Dr. Mariea Clonts for admission.        Final Clinical Impression(s) / ED Diagnoses Final diagnoses:  Closed fracture of left hip, initial encounter [facility])    Rx / DC Orders ED Discharge Orders     None         Pricilla Loveless, MD 08/30/23 2111

## 2023-08-30 NOTE — Assessment & Plan Note (Signed)
Status post radiation.

## 2023-08-30 NOTE — ED Notes (Signed)
ED TO INPATIENT HANDOFF REPORT  ED Nurse Name and Phone #: Wandra Mannan, Paramedic   S Name/Age/Gender Kathleen Pope 89 y.o. female Room/Bed: APA08/APA08  Code Status   Code Status: Prior  Home/SNF/Other Home Patient oriented to: self, place, time, and situation Is this baseline? Yes   Triage Complete: Triage complete  Chief Complaint Closed left hip fracture (HCC) [S72.002A]  Triage Note Per EMS, pt, from home, c/o L hip pain following falls x2.  Pain score 0/10.  Pt reports she was putting away groceries when her "leg gave out."  Pt reports when she tried to stand, her she a second time.   Shortening and rotation noted.   Pt given Fentanyl en route.    Allergies No Known Allergies  Level of Care/Admitting Diagnosis ED Disposition     ED Disposition  Admit   Condition  --   Comment  Hospital Area: [facility] [[id number]]  Level of Care: Telemetry [5]  Covid Evaluation: Asymptomatic - no recent exposure (last 10 days) testing not required  Diagnosis: Closed left hip fracture [facility]) [[id number]]  Admitting Physician: Cresenciano Lick  Attending Physician: Onnie Boer [phone]  Certification:: I certify this patient will need inpatient services for at least 2 midnights  Expected Medical Readiness: 09/02/2023          B Medical/Surgery History Past Medical History:  Diagnosis Date   Anemia    pt denies, yet takes iron   Chronic airway obstruction, not elsewhere classified    Complication of anesthesia    COPD (chronic obstructive pulmonary disease) (HCC)    Diverticulosis    GI hemorrhage from post-treatment cecal ulcer 08/18/2012   Hemorrhoids    HTN (hypertension)    Hypercholesteremia    Lower GI bleed multiple   AVM's   Lung cancer (HCC) 02/14/2023   PONV (postoperative nausea and vomiting)    Past Surgical History:  Procedure Laterality Date   BRONCHIAL BIOPSY  02/14/2023   Procedure: BRONCHIAL BIOPSIES;   Surgeon: Leslye Peer, MD;  Location: [facility] END[facility];  Service: Pulmonary;;   BRONCHIAL BRUSHINGS  02/14/2023   Procedure: BRONCHIAL BRUSHINGS;  Surgeon: Leslye Peer, MD;  Location: [facility]OS[facility];  Service: Pulmonary;;   BRONCHIAL NEEDLE ASPIRATION BIOPSY  02/14/2023   Procedure: BRONCHIAL NEEDLE ASPIRATION BIOPSIES;  Surgeon: Leslye Peer, MD;  Location: [facility];  Service: Pulmonary;;   BRONCHIAL WASHINGS  02/14/2023   Procedure: BRONCHIAL WASHINGS;  Surgeon: Leslye Peer, MD;  Location: [facility];  Service: Pulmonary;;   BUNIONECTOMY     left   COLECTOMY     COLON RESECTION N/A 01/17/2021   Procedure: EXPLORATORY LAPAROTOMY COMPLETION COLECTOMY ileostomy;  Surgeon: Axel Filler, MD;  Location: [facility] ORS;  Service: General;  Laterality: N/A;   COLONOSCOPY  08/10/2012   Procedure: COLONOSCOPY;  Surgeon: Meryl Dare, MD,FACG;  Location: [facility] END[facility];  Service: Endoscopy;  Laterality: N/A;   COLONOSCOPY  08/19/2012   Procedure: COLONOSCOPY;  Surgeon: Iva Boop, MD;  Location: [facility];  Service: Endoscopy;  Laterality: N/A;   COLONOSCOPY N/A 03/11/2013   Procedure: COLONOSCOPY;  Surgeon: Hilarie Fredrickson, MD;  Location: [facility];  Service: Endoscopy;  Laterality: N/A;   COLONOSCOPY N/A 01/16/2021   Procedure: COLONOSCOPY;  Surgeon: Sherrilyn Rist, MD;  Location: [facility];  Service: Gastroenterology;  Laterality: N/A;   COLONOSCOPY WITH PROPOFOL N/A 01/10/2021   Procedure: COLONOSCOPY WITH PROPOFOL;  Surgeon: Hilarie Fredrickson, MD;  Location: [facility];  Service: Endoscopy;  Laterality: N/A;   COLOSTOMY N/A 01/15/2021   Procedure: COLOSTOMY;  Surgeon: Harriette Bouillon, MD;  Location: [facility];  Service: General;  Laterality: N/A;   ESOPHAGOGASTRODUODENOSCOPY (EGD) WITH PROPOFOL N/A 01/14/2021   Procedure: ESOPHAGOGASTRODUODENOSCOPY (EGD) WITH PROPOFOL;  Surgeon: Sherrilyn Rist, MD;  Location: [facility];  Service: Endoscopy;  Laterality: N/A;    FIDUCIAL MARKER PLACEMENT  02/14/2023   Procedure: FIDUCIAL MARKER PLACEMENT;  Surgeon: Leslye Peer, MD;  Location: [facility] ENDOSCOPY;  Service: Pulmonary;;   FLEXIBLE SIGMOIDOSCOPY N/A 01/14/2021   Procedure: Arnell Sieving;  Surgeon: Sherrilyn Rist, MD;  Location: [facility];  Service: Endoscopy;  Laterality: N/A;   HOT HEMOSTASIS N/A 01/10/2021   Procedure: HOT HEMOSTASIS (ARGON PLASMA COAGULATION/BICAP);  Surgeon: Hilarie Fredrickson, MD;  Location: [facility];  Service: Endoscopy;  Laterality: N/A;   IR ANGIOGRAM SELECTIVE EACH ADDITIONAL VESSEL  01/12/2021   IR ANGIOGRAM SELECTIVE EACH ADDITIONAL VESSEL  01/12/2021   IR ANGIOGRAM SELECTIVE EACH ADDITIONAL VESSEL  01/12/2021   IR ANGIOGRAM VISCERAL SELECTIVE  01/09/2021   IR ANGIOGRAM VISCERAL SELECTIVE  01/12/2021   IR EMBO ARTERIAL NOT HEMORR HEMANG INC GUIDE ROADMAPPING  01/12/2021   IR US GUIDE VASC ACCESS RIGHT  01/09/2021   IR US GUIDE VASC ACCESS RIGHT  01/12/2021   PARTIAL COLECTOMY N/A 01/15/2021   Procedure: PARTIAL COLECTOMY;  Surgeon: Harriette Bouillon, MD;  Location: [facility];  Service: General;  Laterality: N/A;   POLYPECTOMY  01/10/2021   Procedure: POLYPECTOMY;  Surgeon: Hilarie Fredrickson, MD;  Location: [facility];  Service: Endoscopy;;   SKIN CANCER EXCISION Right 12/2022   Right Leg above knee   TONSILLECTOMY     VIDEO BRONCHOSCOPY WITH ENDOBRONCHIAL ULTRASOUND Bilateral 02/14/2023   Procedure: VIDEO BRONCHOSCOPY WITH ENDOBRONCHIAL ULTRASOUND;  Surgeon: Leslye Peer, MD;  Location: [facility];  Service: Pulmonary;  Laterality: Bilateral;   VIDEO BRONCHOSCOPY WITH RADIAL ENDOBRONCHIAL ULTRASOUND  02/14/2023   Procedure: VIDEO BRONCHOSCOPY WITH RADIAL ENDOBRONCHIAL ULTRASOUND;  Surgeon: Leslye Peer, MD;  Location: [facility];  Service: Pulmonary;;     A IV Location/Drains/Wounds Patient Lines/Drains/Airways Status     Active Line/Drains/Airways     Name Placement date Placement time Site Days    Peripheral IV 08/30/23 22 G Right Antecubital 08/30/23  1539  Antecubital  less than 1   Ileostomy Standard (end) RUQ 01/17/21  0916  RUQ  955   Pressure Injury 07/18/23 Sacrum Posterior Stage 1 -  Intact skin with non-blanchable redness of a localized area usually over a bony prominence. sm pea size area denuded on sacrum 07/18/23  1624  -- 43            Intake/Output Last 24 hours No intake or output data in the 24 hours ending 08/30/23 1910  Labs/Imaging Results for orders placed or performed during the hospital encounter of 08/30/23 (from the past 48 hour(s))  Basic metabolic panel     Status: Abnormal   Collection Time: 08/30/23  5:08 PM  Result Value Ref Range   Sodium 134 (L) 135 - 145 mmol/L   Potassium 3.2 (L) 3.5 - 5.1 mmol/L   Chloride 98 98 - 111 mmol/L   CO2 24 22 - 32 mmol/L   Glucose, Bld 131 (H) 70 - 99 mg/dL    Comment: Glucose reference range applies only to samples taken after fasting for at least 8 hours.   BUN 11 8 - 23 mg/dL   Creatinine, Ser 7.82  0.44 - 1.00 mg/dL   Calcium 9.0 8.9 - 74.2 mg/dL   GFR, Estimated >59 >56 mL/min    Comment: (NOTE) Calculated using the CKD-EPI Creatinine Equation (2021)    Anion gap 12 5 - 15    Comment: Performed at [facility], [address] [place], [place] [address]  CBC with Differential     Status: Abnormal   Collection Time: 08/30/23  5:08 PM  Result Value Ref Range   WBC 14.6 (H) 4.0 - 10.5 K/uL   RBC 3.69 (L) 3.87 - 5.11 MIL/uL   Hemoglobin 12.4 12.0 - 15.0 g/dL   HCT 43.3 (L) 29.5 - 18.8 %   MCV 96.2 80.0 - 100.0 fL   MCH 33.6 26.0 - 34.0 pg   MCHC 34.9 30.0 - 36.0 g/dL   RDW 41.6 60.6 - 30.1 %   Platelets 321 150 - 400 K/uL   nRBC 0.0 0.0 - 0.2 %   Neutrophils Relative % 89 %   Neutro Abs 12.9 (H) 1.7 - 7.7 K/uL   Lymphocytes Relative 5 %   Lymphs Abs 0.7 0.7 - 4.0 K/uL   Monocytes Relative 5 %   Monocytes Absolute 0.8 0.1 - 1.0 K/uL   Eosinophils Relative 0 %   Eosinophils Absolute 0.1 0.0 - 0.5  K/uL   Basophils Relative 0 %   Basophils Absolute 0.0 0.0 - 0.1 K/uL   Immature Granulocytes 1 %   Abs Immature Granulocytes 0.10 (H) 0.00 - 0.07 K/uL    Comment: Performed at [facility], [address], [place], [place] [address]  Protime-INR     Status: None   Collection Time: 08/30/23  5:08 PM  Result Value Ref Range   Prothrombin Time 12.6 11.4 - 15.2 seconds   INR 0.9 0.8 - 1.2    Comment: (NOTE) INR goal varies based on device and disease states. Performed at [facility], [address], [place], [place] [address]   Type and screen [facility]     Status: None (Preliminary result)   Collection Time: 08/30/23  5:08 PM  Result Value Ref Range   ABO/RH(D) PENDING    Antibody Screen POS    Sample Expiration      09/02/2023,2359 Performed at [facility], [address], [place], [place] [address]   Magnesium     Status: None   Collection Time: 08/30/23  5:08 PM  Result Value Ref Range   Magnesium 1.9 1.7 - 2.4 mg/dL    Comment: Performed at [facility], [address], [place], [place] [address]   DG Chest 1 View  Result Date: 08/30/2023 CLINICAL DATA:  Fall. EXAM: CHEST  1 VIEW COMPARISON:  July 21, 2023. FINDINGS: The heart size and mediastinal contours are within normal limits. Left lung is clear. Mild right perihilar atelectasis or infiltrate is noted. Small right pleural effusion is noted. The visualized skeletal structures are unremarkable. IMPRESSION: Mild right perihilar atelectasis or infiltrate is noted. Small right pleural effusion. Electronically Signed   By: Lupita Raider M.D.   On: 08/30/2023 18:45   DG Hip Unilat With Pelvis 2-3 Views Left  Result Date: 08/30/2023 CLINICAL DATA:  Left hip pain after fall. EXAM: DG HIP (WITH OR WITHOUT PELVIS) 2-3V LEFT COMPARISON:  None Available. FINDINGS: Moderately displaced proximal left femoral neck fracture is noted. IMPRESSION: Moderately displaced proximal left femoral neck fracture. Electronically Signed    By: Lupita Raider M.D.   On: 08/30/2023 18:43    Pending Labs  Unresulted Labs (From admission, onward)    None       Vitals/Pain Today's Vitals   08/30/23 1724 08/30/23 1745 08/30/23 1800 08/30/23 1815  BP:  115/73 121/75 (!) 144/79  Pulse:  (!) 106 97 (!) 105  Resp:  15 (!) 22   Temp:      TempSrc:      SpO2:    (!) 84%  Weight:      Height:      PainSc: 7        Isolation Precautions No active isolations  Medications Medications  HYDROmorphone (DILAUDID) injection 0.5 mg (0.5 mg Intravenous Given 08/30/23 1724)  potassium chloride SA (KLOR-CON M) CR tablet 40 mEq (40 mEq Oral Given 08/30/23 1835)    Mobility non-ambulatory     Focused Assessments Cardiac Assessment Handoff:    No results found for: "CKTOTAL", "CKMB", "CKMBINDEX", "TROPONINI" No results found for: "DDIMER" Does the Patient currently have chest pain? No    R Recommendations: See Admitting Provider Note  Report given to:   Additional Notes: 22ga RAC. Pain under control with positioning and dilaudid. Purewick in place. Pt ambulatory at baseline.

## 2023-08-30 NOTE — Assessment & Plan Note (Addendum)
Presenting with hypoxia, sats down to 84%.  Baseline COPD.  Chest x-ray-mild right perihilar atelectasis or infiltrate is noted.  Small right pleural effusion.  Meeting sepsis criteria with tachycardia heart rates 97-106, and leukocytosis of 14.6. -IV ceftriaxone and doxycycline (QTc prolonged 514) -Lactic acid -Obtain blood cultures - Follow up [facility] - COVID

## 2023-08-30 NOTE — Assessment & Plan Note (Signed)
Stable. -Resume metoprolol

## 2023-08-30 NOTE — Assessment & Plan Note (Addendum)
Status post mechanical fall.  Pelvic x-ray-moderately displaced proximal left femoral neck fracture. - EDP talked to Dr. Dallas Schimke, see in consult, likely surgery tomorrow, recommended getting CT hip- pending -N.p.o. midnight -Currently vomiting, abdomen benign, will start fluids D5 N/s + 40kcl 75cc/hr x 15hrs -IV fentanyl 12.5 every 2 hourly as needed, considering hypoxia

## 2023-08-30 NOTE — Assessment & Plan Note (Signed)
O2 sats down to 84% currently on 3 L sats 90 to 92%. Likely combination of pneumonia, narcotic pain medications, and hypoventilation from pain with underlying COPD - Antibiotics

## 2023-08-30 NOTE — H&P (Addendum)
History and Physical    Kathleen Pope OZD:[id number] DOB: [birth year] DOA: 08/30/2023  PCP: Elfredia Nevins, MD   Patient coming from: Home  I have personally briefly reviewed patient's old medical records in [facility]  Chief Complaint: Fall  HPI: Kathleen Pope is a 89 y.o. female with medical history significant for COPD, hypertension, cancer, ileostomy status. Patient was brought to the ED via EMS reports of a fall and subsequent left hip pain.  Patient reports she was putting away groceries, when her legs tangled up resulting in a fall.  She tried to stand and fell again.  She reports chronic and unchanged difficulty breathing, and cough.  Not on home O2. No chest pain,, no difficulty breathing, no vomiting no loose stools, no urinary symptoms.  Onset of vomiting in the ED.  Nonbloody.  ED Course: Temperature 98.1.  Heart rate 97-106.  Respiratory rate 15-22.  Blood Pressure systolic 115-1 58.  O2 sats down to 84% on room air placed on 2 L.  WBC 14.6.  Potassium 3.2.  EKG shows sinus tachycardia. X-ray shows mild right perihilar atelectasis or infiltrate noted, small right pleural effusion. Pelvic x-ray moderately displaced proximal left femoral neck fracture. EDP to Dr. Dallas Schimke, will see in consult in a.m.  Review of Systems: As per HPI all other systems reviewed and negative.  Past Medical History:  Diagnosis Date   Anemia    pt denies, yet takes iron   Chronic airway obstruction, not elsewhere classified    Complication of anesthesia    COPD (chronic obstructive pulmonary disease) (HCC)    Diverticulosis    GI hemorrhage from post-treatment cecal ulcer 08/18/2012   Hemorrhoids    HTN (hypertension)    Hypercholesteremia    Lower GI bleed multiple   AVM's   Lung cancer (HCC) 02/14/2023   PONV (postoperative nausea and vomiting)     Past Surgical History:  Procedure Laterality Date   BRONCHIAL BIOPSY  02/14/2023   Procedure: BRONCHIAL BIOPSIES;  Surgeon: Leslye Peer, MD;  Location: [facility] ENDOSCOPY;  Service: Pulmonary;;   BRONCHIAL BRUSHINGS  02/14/2023   Procedure: BRONCHIAL BRUSHINGS;  Surgeon: Leslye Peer, MD;  Location: [facility] ENDOSCOPY;  Service: Pulmonary;;   BRONCHIAL NEEDLE ASPIRATION BIOPSY  02/14/2023   Procedure: BRONCHIAL NEEDLE ASPIRATION BIOPSIES;  Surgeon: Leslye Peer, MD;  Location: [facility] ENDOSCOPY;  Service: Pulmonary;;   BRONCHIAL WASHINGS  02/14/2023   Procedure: BRONCHIAL WASHINGS;  Surgeon: Leslye Peer, MD;  Location: [facility] ENDOSCOPY;  Service: Pulmonary;;   BUNIONECTOMY     left   COLECTOMY     COLON RESECTION N/A 01/17/2021   Procedure: EXPLORATORY LAPAROTOMY COMPLETION COLECTOMY ileostomy;  Surgeon: Axel Filler, MD;  Location: [facility] ORS;  Service: General;  Laterality: N/A;   COLONOSCOPY  08/10/2012   Procedure: COLONOSCOPY;  Surgeon: Meryl Dare, MD,FACG;  Location: [facility] ENDOSCOPY;  Service: Endoscopy;  Laterality: N/A;   COLONOSCOPY  08/19/2012   Procedure: COLONOSCOPY;  Surgeon: Iva Boop, MD;  Location: [facility] ENDOSCOPY;  Service: Endoscopy;  Laterality: N/A;   COLONOSCOPY N/A 03/11/2013   Procedure: COLONOSCOPY;  Surgeon: Hilarie Fredrickson, MD;  Location: [facility] ENDOSCOPY;  Service: Endoscopy;  Laterality: N/A;   COLONOSCOPY N/A 01/16/2021   Procedure: COLONOSCOPY;  Surgeon: Sherrilyn Rist, MD;  Location: [facility] ENDOSCOPY;  Service: Gastroenterology;  Laterality: N/A;   COLONOSCOPY WITH PROPOFOL N/A 01/10/2021   Procedure: COLONOSCOPY WITH PROPOFOL;  Surgeon: Hilarie Fredrickson, MD;  Location: [facility];  Service: Endoscopy;  Laterality: N/A;   COLOSTOMY N/A 01/15/2021   Procedure: COLOSTOMY;  Surgeon: Harriette Bouillon, MD;  Location: [facility] ORS;  Service: General;  Laterality: N/A;   ESOPHAGOGASTRODUODENOSCOPY (EGD) WITH PROPOFOL N/A 01/14/2021   Procedure: ESOPHAGOGASTRODUODENOSCOPY (EGD) WITH PROPOFOL;  Surgeon: Sherrilyn Rist, MD;  Location[facility] END[facility];  Service: Endoscopy;  Laterality: N/A;   FIDUCIAL MARKER  PLACEMENT  02/14/2023   Procedure: FIDUCIAL MARKER PLACEMENT;  Surgeon: Leslye Peer, MD;  Location: [facility] ENDOSCOPY;  Service: Pulmonary;;   FLEXIBLE SIGMOIDOSCOPY N/A 01/14/2021   Procedure: Arnell Sieving;  Surgeon: Sherrilyn Rist, MD;  [facility];  Service: Endoscopy;  Laterality: N/A;   HOT HEMOSTASIS N/A 01/10/2021   Procedure: HOT HEMOSTASIS (ARGON PLASMA COAGULATION/BICAP);  Surgeon: Hilarie Fredrickson, MD;  Location: [facility] ENDOSCOPY;  Service: Endoscopy;  Laterality: N/A;   IR ANGIOGRAM SELECTIVE EACH ADDITIONAL VESSEL  01/12/2021   IR ANGIOGRAM SELECTIVE EACH ADDITIONAL VESSEL  01/12/2021   IR ANGIOGRAM SELECTIVE EACH ADDITIONAL VESSEL  01/12/2021   IR ANGIOGRAM VISCERAL SELECTIVE  01/09/2021   IR ANGIOGRAM VISCERAL SELECTIVE  01/12/2021   IR EMBO ARTERIAL NOT HEMORR HEMANG INC GUIDE ROADMAPPING  01/12/2021   IR US GUIDE VASC ACCESS RIGHT  01/09/2021   IR US GUIDE VASC ACCESS RIGHT  01/12/2021   PARTIAL COLECTOMY N/A 01/15/2021   Procedure: PARTIAL COLECTOMY;  Surgeon: Harriette Bouillon, MD;  Location: [facility];  Service: General;  Laterality: N/A;   POLYPECTOMY  01/10/2021   Procedure: POLYPECTOMY;  Surgeon: Hilarie Fredrickson, MD;  Location: [facility] ENDOSCOPY;  Service: Endoscopy;;   SKIN CANCER EXCISION Right 12/2022   Right Leg above knee   TONSILLECTOMY     VIDEO BRONCHOSCOPY WITH ENDOBRONCHIAL ULTRASOUND Bilateral 02/14/2023   Procedure: VIDEO BRONCHOSCOPY WITH ENDOBRONCHIAL ULTRASOUND;  Surgeon: Leslye Peer, MD;  Location: [facility] ENDOSCOPY;  Service: Pulmonary;  Laterality: Bilateral;   VIDEO BRONCHOSCOPY WITH RADIAL ENDOBRONCHIAL ULTRASOUND  02/14/2023   Procedure: VIDEO BRONCHOSCOPY WITH RADIAL ENDOBRONCHIAL ULTRASOUND;  Surgeon: Leslye Peer, MD;  Location: [facility] ENDOSCOPY;  Service: Pulmonary;;     reports that she has been smoking cigarettes. She has a 59 pack-year smoking history. She has never used smokeless tobacco. She reports that she does not drink alcohol and  does not use drugs.  No Known Allergies  Family History  Problem Relation Age of Onset   Breast cancer Mother    Lung cancer Father    Breast cancer Sister    Anuerysm Brother        in stomach   Colon cancer Neg Hx    Esophageal cancer Neg Hx    Rectal cancer Neg Hx     Prior to Admission medications   Medication Sig Start Date End Date Taking? Authorizing Provider  acetaminophen (TYLENOL) 500 MG tablet Take 2 tablets (1,000 mg total) by mouth every 6 (six) hours as needed for moderate pain or mild pain. 02/14/23   Leslye Peer, MD  ECHINACEA PO Take 167 mg by mouth daily.    [provider]  Ferrous Sulfate 27 MG TABS Take 54 mg by mouth daily with breakfast.    [provider]  fluticasone-salmeterol (ADVAIR) 100-50 MCG/ACT AEPB Inhale 1 puff into the lungs 2 (two) times daily. 02/03/23   Luciano Cutter, MD  metoprolol tartrate (LOPRESSOR) 25 MG tablet Take 0.5 tablets (12.5 mg total) by mouth 2 (two) times daily. 01/25/21   Rai, Delene Ruffini, MD  Multiple Vitamins-Minerals (MULTIVITAMIN WITH MINERALS)  tablet Take 1 tablet by mouth daily. Centrum Silver    [provider]  neomycin-polymyxin-pramoxine (NEOSPORIN PLUS) 1 % cream Apply 1 Application topically daily.    [provider]  Omega-3 Fatty Acids (FISH OIL) 1000 MG CAPS Take 1,000 Units by mouth daily.    [provider]  Potassium 99 MG TABS Take by mouth.    [provider]  pravastatin (PRAVACHOL) 10 MG tablet Take 10 mg by mouth daily.    [provider]    Physical Exam: Vitals:   08/30/23 1542 08/30/23 1543  BP:  (!) 158/83  Pulse:  97  Resp:  20  Temp:  98.1 F (36.7 C)  TempSrc:  Oral  SpO2:  (!) 84%  Weight: 41.7 kg   Height: 5\' 1"  (1.549 m)     Constitutional: NAD, calm, comfortable Vitals:   08/30/23 1542 08/30/23 1543  BP:  (!) 158/83  Pulse:  97  Resp:  20  Temp:  98.1 F (36.7 C)  TempSrc:  Oral  SpO2:  (!) 84%  Weight: 41.7 kg    Height: 5\' 1"  (1.549 m)    Eyes: PERRL, lids and conjunctivae normal ENMT: Mucous membranes are moist.  Neck: normal, supple, no masses, no thyromegaly Respiratory: clear to auscultation bilaterally, no wheezing, no crackles. Normal respiratory effort. No accessory muscle use.  Cardiovascular: Tachycardic, regular rate and rhythm, Known 3/6 systolic murmur loudest in aortic area , no murmurs / rubs / gallops. No extremity edema.  Extremities warm.  Abdomen: no tenderness, no masses palpated. No hepatosplenomegaly. Bowel sounds positive.  Ileostomy status. Musculoskeletal: no clubbing / cyanosis. No joint deformity upper and lower extremities.  Skin: no rashes, lesions, ulcers. No induration Neurologic: No facial asymmetry, moving extremities spontaneously, left lower extremity not tested. Psychiatric: Normal judgment and insight. Alert and oriented x 3. Normal mood.   Labs on Admission: I have personally reviewed following labs and imaging studies  CBC: Recent Labs  Lab 08/30/23 1708  WBC 14.6*  NEUTROABS 12.9*  HGB 12.4  HCT 35.5*  MCV 96.2  PLT 321   Basic Metabolic Panel: Recent Labs  Lab 08/30/23 1708  NA 134*  K 3.2*  CL 98  CO2 24  GLUCOSE 131*  BUN 11  CREATININE 0.75  CALCIUM 9.0   Coagulation Profile: Recent Labs  Lab 08/30/23 1708  INR 0.9   Radiological Exams on Admission: No results found.  EKG: Independently reviewed.  Sinus tachycardia rate 101, QTc 514.  No significant change from prior.  Assessment/Plan Principal Problem:   Closed left hip fracture (HCC) Active Problems:   Acute hypoxic respiratory failure (HCC)   CAP (community acquired pneumonia)   Severe sepsis (HCC)   Prolonged QT interval   HTN (hypertension)   Ileostomy in place [facility])   Hypokalemia   Bronchogenic cancer of right lung (HCC)   COPD (chronic obstructive pulmonary disease) (HCC)    Assessment and Plan: * Closed left hip fracture (HCC) Status post mechanical fall.   Pelvic x-ray-moderately displaced proximal left femoral neck fracture. - EDP talked to Dr. Dallas Schimke, see in consult, likely surgery tomorrow, recommended getting CT hip- pending -N.p.o. midnight -Currently vomiting, abdomen benign, will start fluids D5 N/s + 40kcl 75cc/hr x 15hrs -IV fentanyl 12.5 every 2 hourly as needed, considering hypoxia  Severe sepsis (HCC) Meeting severe sepsis criteria, with tachycardia heart rate 97-106, leukocytosis of 14.6, and evidence of endorgan dysfunction hypoxia.  At this time likely from pneumonia. -Follow-up blood cultures, UA  CAP (community acquired pneumonia) Presenting with hypoxia, sats down to 84%.  Baseline COPD.  Chest x-ray-mild right perihilar atelectasis or infiltrate is noted.  Small right pleural effusion.  Meeting sepsis criteria with tachycardia heart rates 97-106, and leukocytosis of 14.6. -IV ceftriaxone and doxycycline (QTc prolonged 514) -Lactic acid -Obtain blood cultures - Follow up UA - COVID   Acute hypoxic respiratory failure (HCC) O2 sats down to 84% currently on 3 L sats 90 to 92%. Likely combination of pneumonia, narcotic pain medications, and hypoventilation from pain with underlying COPD - Antibiotics  Prolonged QT interval K- 3.2. Mag- 1.9.  COPD (chronic obstructive pulmonary disease) (HCC) Stable. -Resume home regimen  Bronchogenic cancer of right lung (HCC) Status post radiation.  Hypokalemia Potassium 3.2.  Magnesium 1.9  HTN (hypertension) Stable. -Resume metoprolol   DVT prophylaxis: heparin x 1, SCDS Code Status: DNR- confirmed with patient at bedside Family Communication: None at bedside, nephew Nena Jordan is HCPOA. Disposition Plan: > 2 days Consults called: Ortho  Admission status: Inpt Tele I certify that at the point of admission it is my clinical judgment that the patient will require inpatient hospital care spanning beyond 2 midnights from the point of admission due to high intensity of  service, high risk for further deterioration and high frequency of surveillance required.     Author: Onnie Boer, MD 08/30/2023 9:34 PM  For on call review [email or web address].

## 2023-08-30 NOTE — ED Triage Notes (Signed)
Per EMS, pt, from home, c/o L hip pain following falls x2.  Pain score 0/10.  Pt reports she was putting away groceries when her "leg gave out."  Pt reports when she tried to stand, her she a second time.   Shortening and rotation noted.   Pt given Fentanyl en route.

## 2023-08-30 NOTE — Assessment & Plan Note (Signed)
Potassium 3.2.  Magnesium 1.9

## 2023-08-30 NOTE — Assessment & Plan Note (Signed)
K- 3.2. Mag- 1.9.

## 2023-08-31 ENCOUNTER — Inpatient Hospital Stay (HOSPITAL_COMMUNITY): Payer: Medicare Other | Admitting: Certified Registered Nurse Anesthetist

## 2023-08-31 ENCOUNTER — Encounter (HOSPITAL_COMMUNITY): Payer: Self-pay | Admitting: Internal Medicine

## 2023-08-31 ENCOUNTER — Inpatient Hospital Stay (HOSPITAL_COMMUNITY): Payer: Medicare Other

## 2023-08-31 ENCOUNTER — Encounter (HOSPITAL_COMMUNITY)
Admission: EM | Disposition: A | Discharge status: Skilled Nursing Facility | Payer: Self-pay | Source: Home / Self Care | Attending: Internal Medicine

## 2023-08-31 ENCOUNTER — Other Ambulatory Visit: Payer: Self-pay

## 2023-08-31 DIAGNOSIS — C3491 Malignant neoplasm of unspecified part of right bronchus or lung: Secondary | ICD-10-CM | POA: Diagnosis not present

## 2023-08-31 DIAGNOSIS — Z932 Ileostomy status: Secondary | ICD-10-CM | POA: Diagnosis not present

## 2023-08-31 DIAGNOSIS — S72002A Fracture of unspecified part of neck of left femur, initial encounter for closed fracture: Secondary | ICD-10-CM | POA: Diagnosis not present

## 2023-08-31 DIAGNOSIS — S72002D Fracture of unspecified part of neck of left femur, subsequent encounter for closed fracture with routine healing: Secondary | ICD-10-CM

## 2023-08-31 DIAGNOSIS — J9601 Acute respiratory failure with hypoxia: Secondary | ICD-10-CM | POA: Diagnosis not present

## 2023-08-31 DIAGNOSIS — F17210 Nicotine dependence, cigarettes, uncomplicated: Secondary | ICD-10-CM

## 2023-08-31 DIAGNOSIS — I10 Essential (primary) hypertension: Secondary | ICD-10-CM

## 2023-08-31 HISTORY — PX: HIP ARTHROPLASTY: SHX981

## 2023-08-31 LAB — BASIC METABOLIC PANEL
Anion gap: 9 (ref 5–15)
BUN: 13 mg/dL (ref 8–23)
CO2: 24 mmol/L (ref 22–32)
Calcium: 8.3 mg/dL — ABNORMAL LOW (ref 8.9–10.3)
Chloride: 101 mmol/L (ref 98–111)
Creatinine, Ser: 0.65 mg/dL (ref 0.44–1.00)
GFR, Estimated: >60 mL/min (ref >60)
Glucose, Bld: 142 mg/dL — ABNORMAL HIGH (ref 70–99)
Potassium: 4.8 mmol/L (ref 3.5–5.1)
Sodium: 134 mmol/L — ABNORMAL LOW (ref 135–145)

## 2023-08-31 LAB — SURGICAL PCR SCREEN
MRSA, PCR: POSITIVE — AB
Staphylococcus aureus: POSITIVE — AB

## 2023-08-31 LAB — CBC
HCT: 32.8 % — ABNORMAL LOW (ref 36.0–46.0)
Hemoglobin: 11.1 g/dL — ABNORMAL LOW (ref 12.0–15.0)
MCH: 33.3 pg (ref 26.0–34.0)
MCHC: 33.8 g/dL (ref 30.0–36.0)
MCV: 98.5 fL (ref 80.0–100.0)
Platelets: 295 10*3/uL (ref 150–400)
RBC: 3.33 MIL/uL — ABNORMAL LOW (ref 3.87–5.11)
RDW: 14.3 % (ref 11.5–15.5)
WBC: 11.2 10*3/uL — ABNORMAL HIGH (ref 4.0–10.5)
nRBC: 0 % (ref 0.0–0.2)

## 2023-08-31 LAB — GLUCOSE, CAPILLARY: Glucose-Capillary: 141 mg/dL — ABNORMAL HIGH (ref 70–99)

## 2023-08-31 LAB — SARS CORONAVIRUS 2 BY RT PCR: SARS Coronavirus 2 by RT PCR: NEGATIVE

## 2023-08-31 LAB — PROCALCITONIN: Procalcitonin: <0.1 ng/mL

## 2023-08-31 SURGERY — HEMIARTHROPLASTY, HIP, DIRECT ANTERIOR APPROACH, FOR FRACTURE
Anesthesia: General | Site: Hip | Laterality: Left

## 2023-08-31 MED ORDER — BUPIVACAINE-EPINEPHRINE (PF) 0.5% -1:200000 IJ SOLN
INTRAMUSCULAR | Status: AC
  Filled 2023-08-31: qty 30

## 2023-08-31 MED ORDER — SUGAMMADEX SODIUM 200 MG/2ML IV SOLN
INTRAVENOUS | Status: DC | PRN
  Administered 2023-08-31: 150 mg via INTRAVENOUS

## 2023-08-31 MED ORDER — VANCOMYCIN HCL 1000 MG IV SOLR
INTRAVENOUS | Status: AC
  Filled 2023-08-31: qty 20

## 2023-08-31 MED ORDER — DEXAMETHASONE SODIUM PHOSPHATE 10 MG/ML IJ SOLN
INTRAMUSCULAR | Status: DC | PRN
  Administered 2023-08-31: 5 mg via INTRAVENOUS

## 2023-08-31 MED ORDER — TRANEXAMIC ACID-NACL 1000-0.7 MG/100ML-% IV SOLN
1000.0000 mg | INTRAVENOUS | Status: AC
  Administered 2023-08-31: 1000 mg via INTRAVENOUS

## 2023-08-31 MED ORDER — LACTATED RINGERS IV SOLN: INTRAVENOUS | Status: DC

## 2023-08-31 MED ORDER — PHENYLEPHRINE HCL-NACL 20-0.9 MG/250ML-% IV SOLN
INTRAVENOUS | Status: DC | PRN
  Administered 2023-08-31: 50 ug/min via INTRAVENOUS

## 2023-08-31 MED ORDER — CHLORHEXIDINE GLUCONATE CLOTH 2 % EX PADS
6.0000 | MEDICATED_PAD | Freq: Every day | CUTANEOUS | Status: DC
  Administered 2023-08-31 – 2023-09-03 (×4): 6 via TOPICAL

## 2023-08-31 MED ORDER — VANCOMYCIN HCL 1 G IV SOLR
INTRAVENOUS | Status: DC | PRN
  Administered 2023-08-31: 1000 mg

## 2023-08-31 MED ORDER — 0.9 % SODIUM CHLORIDE (POUR BTL) OPTIME
TOPICAL | Status: DC | PRN
  Administered 2023-08-31: 1000 mL

## 2023-08-31 MED ORDER — TRANEXAMIC ACID-NACL 1000-0.7 MG/100ML-% IV SOLN
INTRAVENOUS | Status: AC
  Filled 2023-08-31: qty 100

## 2023-08-31 MED ORDER — ORAL CARE MOUTH RINSE: 15.0000 mL | Freq: Once | OROMUCOSAL | Status: AC

## 2023-08-31 MED ORDER — PHENYLEPHRINE 80 MCG/ML (10ML) SYRINGE FOR IV PUSH (FOR BLOOD PRESSURE SUPPORT)
PREFILLED_SYRINGE | INTRAVENOUS | Status: AC
  Filled 2023-08-31: qty 10

## 2023-08-31 MED ORDER — FENTANYL CITRATE (PF) 250 MCG/5ML IJ SOLN
INTRAMUSCULAR | Status: AC
  Filled 2023-08-31: qty 5

## 2023-08-31 MED ORDER — ONDANSETRON HCL 4 MG/2ML IJ SOLN
INTRAMUSCULAR | Status: DC | PRN
  Administered 2023-08-31: 4 mg via INTRAVENOUS

## 2023-08-31 MED ORDER — ROCURONIUM BROMIDE 10 MG/ML (PF) SYRINGE
PREFILLED_SYRINGE | INTRAVENOUS | Status: DC | PRN
  Administered 2023-08-31: 20 mg via INTRAVENOUS
  Administered 2023-08-31: 50 mg via INTRAVENOUS

## 2023-08-31 MED ORDER — BUPIVACAINE-EPINEPHRINE (PF) 0.5% -1:200000 IJ SOLN
INTRAMUSCULAR | Status: DC | PRN
  Administered 2023-08-31: 25 mL via PERINEURAL

## 2023-08-31 MED ORDER — SODIUM CHLORIDE 0.9 % IR SOLN
Status: DC | PRN
  Administered 2023-08-31: 3000 mL

## 2023-08-31 MED ORDER — PROPOFOL 10 MG/ML IV BOLUS
INTRAVENOUS | Status: DC | PRN
  Administered 2023-08-31: 25 ug/kg/min via INTRAVENOUS
  Administered 2023-08-31: 20 mg via INTRAVENOUS
  Administered 2023-08-31: 100 mg via INTRAVENOUS
  Administered 2023-08-31: 30 mg via INTRAVENOUS
  Administered 2023-08-31: 20 mg via INTRAVENOUS

## 2023-08-31 MED ORDER — FENTANYL CITRATE PF 50 MCG/ML IJ SOSY: 25.0000 ug | PREFILLED_SYRINGE | INTRAMUSCULAR | Status: DC | PRN

## 2023-08-31 MED ORDER — CHLORHEXIDINE GLUCONATE 0.12 % MT SOLN
15.0000 mL | Freq: Once | OROMUCOSAL | Status: AC
  Administered 2023-08-31: 15 mL via OROMUCOSAL

## 2023-08-31 MED ORDER — ROCURONIUM BROMIDE 10 MG/ML (PF) SYRINGE
PREFILLED_SYRINGE | INTRAVENOUS | Status: AC
  Filled 2023-08-31: qty 10

## 2023-08-31 MED ORDER — PROPOFOL 10 MG/ML IV BOLUS
INTRAVENOUS | Status: AC
  Filled 2023-08-31: qty 20

## 2023-08-31 MED ORDER — PHENYLEPHRINE 80 MCG/ML (10ML) SYRINGE FOR IV PUSH (FOR BLOOD PRESSURE SUPPORT)
PREFILLED_SYRINGE | INTRAVENOUS | Status: DC | PRN
Start: 2023-08-31 — End: 2023-08-31
  Administered 2023-08-31: 80 ug via INTRAVENOUS
  Administered 2023-08-31: 160 ug via INTRAVENOUS
  Administered 2023-08-31: 80 ug via INTRAVENOUS
  Administered 2023-08-31: 160 ug via INTRAVENOUS

## 2023-08-31 MED ORDER — STERILE WATER FOR IRRIGATION IR SOLN
Status: DC | PRN
Start: 2023-08-31 — End: 2023-08-31
  Administered 2023-08-31: 500 mL

## 2023-08-31 MED ORDER — ONDANSETRON HCL 4 MG/2ML IJ SOLN
INTRAMUSCULAR | Status: AC
  Filled 2023-08-31: qty 2

## 2023-08-31 MED ORDER — PROPOFOL 500 MG/50ML IV EMUL
INTRAVENOUS | Status: AC
  Filled 2023-08-31: qty 50

## 2023-08-31 MED ORDER — CEFAZOLIN SODIUM-DEXTROSE 2-4 GM/100ML-% IV SOLN
2.0000 g | INTRAVENOUS | Status: AC
  Administered 2023-08-31: 2 g via INTRAVENOUS
  Filled 2023-08-31: qty 100

## 2023-08-31 MED ORDER — FENTANYL CITRATE (PF) 100 MCG/2ML IJ SOLN
INTRAMUSCULAR | Status: DC | PRN
  Administered 2023-08-31 (×4): 50 ug via INTRAVENOUS

## 2023-08-31 MED ORDER — PHENYLEPHRINE HCL-NACL 20-0.9 MG/250ML-% IV SOLN
INTRAVENOUS | Status: AC
  Filled 2023-08-31: qty 250

## 2023-08-31 MED ORDER — DEXAMETHASONE SODIUM PHOSPHATE 10 MG/ML IJ SOLN
INTRAMUSCULAR | Status: AC
  Filled 2023-08-31: qty 1

## 2023-08-31 SURGICAL SUPPLY — 54 items
APL PRP STRL LF DISP 70% ISPRP (MISCELLANEOUS) ×1
BIPOLAR PROS AML 44 (Hips) ×1 IMPLANT
BIT DRILL 2.8X128 (BIT) ×2 IMPLANT
BLADE SAGITTAL 25.0X1.27X90 (BLADE) ×2 IMPLANT
CHLORAPREP W/TINT 26 (MISCELLANEOUS) ×2 IMPLANT
CLOTH BEACON ORANGE TIMEOUT ST (SAFETY) ×2 IMPLANT
COVER LIGHT HANDLE STERIS (MISCELLANEOUS) ×4 IMPLANT
DECANTER SPIKE VIAL GLASS SM (MISCELLANEOUS) ×2 IMPLANT
DRAPE HIP W/POCKET STRL (MISCELLANEOUS) ×2 IMPLANT
DRAPE SURG 17X23 STRL (DRAPES) ×2 IMPLANT
DRAPE U-SHAPE 47X51 STRL (DRAPES) ×2 IMPLANT
DRESSING AQUACEL AG ADV 3.5X12 (MISCELLANEOUS) ×2 IMPLANT
DRSG AQUACEL AG ADV 3.5X12 (MISCELLANEOUS) ×1
DRSG MEPILEX SACRM 8.7X9.8 (GAUZE/BANDAGES/DRESSINGS) ×2 IMPLANT
ELECT REM PT RETURN 9FT ADLT (ELECTROSURGICAL) ×1
ELECTRODE REM PT RTRN 9FT ADLT (ELECTROSURGICAL) ×2 IMPLANT
GLOVE BIO SURGEON STRL SZ7 (GLOVE) IMPLANT
GLOVE BIO SURGEON STRL SZ8 (GLOVE) ×8 IMPLANT
GLOVE BIOGEL PI IND STRL 7.0 (GLOVE) ×4 IMPLANT
GLOVE SRG 8 PF TXTR STRL LF DI (GLOVE) ×2 IMPLANT
GLOVE SURG UNDER POLY LF SZ8 (GLOVE) ×1
GOWN STRL REUS W/TWL LRG LVL3 (GOWN DISPOSABLE) ×6 IMPLANT
HANDPIECE INTERPULSE COAX TIP (DISPOSABLE) ×1
HEAD BIPOLAR PROS AML 44 (Hips) IMPLANT
HEAD FEM STD 28X+1.5 STRL (Hips) IMPLANT
INST SET MAJOR BONE (KITS) ×2 IMPLANT
IV NS IRRIG 3000ML ARTHROMATIC (IV SOLUTION) ×2 IMPLANT
KIT BLADEGUARD II DBL (SET/KITS/TRAYS/PACK) ×2 IMPLANT
KIT TURNOVER KIT A (KITS) ×2 IMPLANT
MANIFOLD NEPTUNE II (INSTRUMENTS) ×2 IMPLANT
MARKER SKIN DUAL TIP RULER LAB (MISCELLANEOUS) ×2 IMPLANT
NDL HYPO 21X1.5 SAFETY (NEEDLE) ×2 IMPLANT
NDL MAYO 1/2 CRC TROCAR PT (NEEDLE) IMPLANT
NEEDLE HYPO 21X1.5 SAFETY (NEEDLE) ×1 IMPLANT
NEEDLE MAYO 1/2 CRC TROCAR PT (NEEDLE) ×1 IMPLANT
NS IRRIG 1000ML POUR BTL (IV SOLUTION) ×2 IMPLANT
PACK SURGICAL SETUP 50X90 (CUSTOM PROCEDURE TRAY) ×2 IMPLANT
PACK TOTAL JOINT (CUSTOM PROCEDURE TRAY) ×2 IMPLANT
PAD ARMBOARD 7.5X6 YLW CONV (MISCELLANEOUS) ×2 IMPLANT
PILLOW ABDUCTION FOAM SM (MISCELLANEOUS) IMPLANT
POSITIONER HEAD 8X9X4 ADT (SOFTGOODS) ×2 IMPLANT
SET BASIN LINEN APH (SET/KITS/TRAYS/PACK) ×2 IMPLANT
SET HNDPC FAN SPRY TIP SCT (DISPOSABLE) ×2 IMPLANT
STAPLER VISISTAT 35W (STAPLE) IMPLANT
STEM FEMORAL SZ 6MM STD ACTIS (Stem) IMPLANT
SUT MNCRL AB 4-0 PS2 18 (SUTURE) ×4 IMPLANT
SUT MON AB 2-0 CT1 36 (SUTURE) ×2 IMPLANT
SUT VIC AB 1 CT1 27 (SUTURE) ×4
SUT VIC AB 1 CT1 27XBRD ANTBC (SUTURE) ×8 IMPLANT
SYR 30ML LL (SYRINGE) ×4 IMPLANT
SYR BULB IRRIG 60ML STRL (SYRINGE) ×4 IMPLANT
TRAY FOLEY MTR SLVR 16FR STAT (SET/KITS/TRAYS/PACK) ×2 IMPLANT
WATER STERILE IRR 1000ML POUR (IV SOLUTION) ×4 IMPLANT
YANKAUER SUCT 12FT TUBE ARGYLE (SUCTIONS) ×2 IMPLANT

## 2023-08-31 NOTE — Progress Notes (Signed)
PROGRESS NOTE  Kathleen Pope ZOX:[id number] DOB: 02-17-1933 DOA: 08/30/2023 PCP: Elfredia Nevins, MD  Brief History:  89 year old female with a history of right-sided lung cancer s/p XRT (03/31/23-04/12/23), tobacco abuse, COPD, GI bleed status post total colectomy end ileostomy, hypertension presenting with a mechanical fall resulting in subsequent left hip pain.  The patient was putting away her groceries when she felt like her legs got tangled up resulting in a mechanical fall.  She had denied any loss of consciousness.  The patient tried to stand again, but unfortunately sustained another fall. The patient denies any fevers, chills, chest pain, shortness breath, nausea, vomiting or diarrhea, abdominal pain, dysuria.  She did have 1 episode of emesis in the ED.  There was no blood.  She does report chronic dyspnea on exertion and coughing. In the ED, the patient was afebrile and hemodynamically stable with oxygen saturation down to 84% on room air.  She was placed on 2 L with saturation up to 94%.  WBC 14.6, hemoglobin 12.4, platelets 295.  Potassium was 3.2.  Serum creatinine 0.75.  COVID-19 PCR is negative.  UA was negative for pyuria.  EKG showed sinus rhythm with nonspecific ST changes.  CT of the left hip showed a displaced left femoral neck fracture.  There was an age-indeterminate right pubic bone fracture.  Chest x-ray showed right perihilar opacity. Orthopedic surgery, Dr. Dallas Schimke was consulted to assist.   Assessment/Plan: Left femoral neck fracture -Orthopedic surgery consult appreciated -08/30/2023 CT left hip as discussed above -Remain n.p.o. for possible operative intervention -Judicious opioids  Severe sepsis -Presented with tachycardia, leukocytosis, and hypoxia -Secondary to pneumonia  Lobar pneumonia -Personally reviewed chest x-ray-- RLL opacity -Check PCT -Lactic acid 1.2 -Continue ceftriaxone and azithromycin  Acute respiratory failure with  hypoxia -Secondary to pneumonia -Wean oxygen as tolerated for saturation greater 92% -Stable on 2.5 L  COPD -Continue Dulera  Bronchogenic cancer of the right lung -Status post radiation -Follow-up Dr. Ellin Saba  Hypokalemia -Replete -Magnesium 1.9  Essential hypertension -Continue home dose metoprolol tartrate           Family Communication:   no Family at bedside  Consultants:  orthopedic surgery  Code Status:   DNR  DVT Prophylaxis:  [facility] heparin on hold for surgery   Procedures: As Listed in Progress Note Above  Antibiotics: Ceftriaxone 10/15>> Doxy 10/15>>     Subjective: Patient denies fevers, chills, headache, chest pain, dyspnea, nausea, vomiting, diarrhea, abdominal pain, dysuria, hematuria, hematochezia, and melena.   Objective: Vitals:   08/30/23 2132 08/31/23 0014 08/31/23 0513 08/31/23 0718  BP:  126/74 120/67   Pulse:  95 78   Resp:  18 16   Temp:  98.3 F (36.8 C) 98.6 F (37 C)   TempSrc:      SpO2: 96% 94% 93% 93%  Weight:      Height:        Intake/Output Summary (Last 24 hours) at 08/31/2023 0812 Last data filed at 08/31/2023 0540 Gross per 24 hour  Intake 628.28 ml  Output 225 ml  Net 403.28 ml   Weight change:  Exam:  General:  Pt is alert, follows commands appropriately, not in acute distress HEENT: No icterus, No thrush, No neck mass, [place]/AT Cardiovascular: RRR, S1/S2, no rubs, no gallops Respiratory: CTA bilaterally, no wheezing, no crackles, no rhonchi Abdomen: Soft/+BS, non tender, non distended, no guarding Extremities: No edema, No lymphangitis, No petechiae, No rashes, no synovitis  Data Reviewed: I have personally reviewed following labs and imaging studies Basic Metabolic Panel: Recent Labs  Lab 08/30/23 1708 08/31/23 0422  NA 134* 134*  K 3.2* 4.8  CL 98 101  CO2 24 24  GLUCOSE 131* 142*  BUN 11 13  CREATININE 0.75 0.65  CALCIUM 9.0 8.3*  MG 1.9  --    Liver Function Tests: No results  for input(s): "AST", "ALT", "ALKPHOS", "BILITOT", "PROT", "ALBUMIN" in the last 168 hours. No results for input(s): "LIPASE", "AMYLASE" in the last 168 hours. No results for input(s): "AMMONIA" in the last 168 hours. Coagulation Profile: Recent Labs  Lab 08/30/23 1708  INR 0.9   CBC: Recent Labs  Lab 08/30/23 1708 08/31/23 0422  WBC 14.6* 11.2*  NEUTROABS 12.9*  --   HGB 12.4 11.1*  HCT 35.5* 32.8*  MCV 96.2 98.5  PLT 321 295   Cardiac Enzymes: No results for input(s): "CKTOTAL", "CKMB", "CKMBINDEX", "TROPONINI" in the last 168 hours. BNP: Invalid input(s): "POCBNP" CBG: No results for input(s): "GLUCAP" in the last 168 hours. HbA1C: No results for input(s): "HGBA1C" in the last 72 hours. Urine analysis:    Component Value Date/Time   COLORURINE YELLOW 08/30/2023 2220   APPEARANCEUR CLEAR 08/30/2023 2220   LABSPEC 1.014 08/30/2023 2220   PHURINE 6.0 08/30/2023 2220   GLUCOSEU 50 (A) 08/30/2023 2220   HGBUR NEGATIVE 08/30/2023 2220   BILIRUBINUR NEGATIVE 08/30/2023 2220   KETONESUR 5 (A) 08/30/2023 2220   PROTEINUR 30 (A) 08/30/2023 2220   UROBILINOGEN 1.0 03/09/2013 2028   NITRITE NEGATIVE 08/30/2023 2220   LEUKOCYTESUR NEGATIVE 08/30/2023 2220   Sepsis Labs: @LABRCNTIP (procalcitonin:4,lacticidven:4) ) Recent Results (from the past 240 hour(s))  Culture, blood (Routine X 2) w Reflex to ID Panel     Status: None (Preliminary result)   Collection Time: 08/30/23 10:02 PM   Specimen: BLOOD  Result Value Ref Range Status   Specimen Description BLOOD BLOOD LEFT ARM  Final   Special Requests   Final    BOTTLES DRAWN AEROBIC AND ANAEROBIC Blood Culture adequate volume Performed at [facility], [address], [place], [place] [address]    Culture PENDING  Incomplete   Report Status PENDING  Incomplete  Culture, blood (Routine X 2) w Reflex to ID Panel     Status: None (Preliminary result)   Collection Time: 08/30/23 10:04 PM   Specimen: BLOOD  Result Value Ref  Range Status   Specimen Description BLOOD BLOOD LEFT ARM  Final   Special Requests   Final    BOTTLES DRAWN AEROBIC AND ANAEROBIC Blood Culture adequate volume Performed at [facility], [address] [place], [place] [address]    Culture PENDING  Incomplete   Report Status PENDING  Incomplete  SARS Coronavirus 2 by RT PCR (hospital order, performed in [facility] hospital lab) *cepheid single result test* Anterior Nasal Swab     Status: None   Collection Time: 08/30/23 10:30 PM   Specimen: Anterior Nasal Swab  Result Value Ref Range Status   SARS Coronavirus 2 by RT PCR NEGATIVE NEGATIVE Final    Comment: (NOTE) SARS-CoV-2 target nucleic acids are NOT DETECTED.  The SARS-CoV-2 RNA is generally detectable in upper and lower respiratory specimens during the acute phase of infection. The lowest concentration of SARS-CoV-2 viral copies this assay can detect is 250 copies / mL. A negative result does not preclude SARS-CoV-2 infection and should not be used as the sole basis for treatment or other patient management decisions.  A negative result may occur with improper specimen collection / handling, submission of specimen other than nasopharyngeal swab, presence of viral mutation(s) within the areas targeted by this assay, and inadequate number of viral copies (<250 copies / mL). A negative result must be combined with clinical observations, patient history, and epidemiological information.  Fact Sheet for Patients:   RoadLapTop.co.za  Fact Sheet for Healthcare Providers: http://kim-miller.com/  This test is not yet approved or  cleared by the [place] FDA and has been authorized for detection and/or diagnosis of SARS-CoV-2 by FDA under an Emergency Use Authorization (EUA).  This EUA will remain in effect (meaning this test can be used) for the duration of the COVID-19 declaration under Section 564(b)(1) of the Act, 21  U.S.C. section 360bbb-3(b)(1), unless the authorization is terminated or revoked sooner.  Performed at [facility], [address] [place], [place] [address]   Surgical PCR screen     Status: Abnormal   Collection Time: 08/30/23 10:30 PM   Specimen: Nasal Mucosa; Nasal Swab  Result Value Ref Range Status   MRSA, PCR POSITIVE (A) NEGATIVE Final    Comment: RESULT CALLED TO, READ BACK BY AND VERIFIED WITH: Karolee Ohs [id number], VIRAY,J    Staphylococcus aureus POSITIVE (A) NEGATIVE Final    Comment: Karolee Ohs [phone], VIRAY,J (NOTE) The Xpert SA Assay (FDA approved for NASAL specimens in patients 87 years of age and older), is one component of a comprehensive surveillance program. It is not intended to diagnose infection nor to guide or monitor treatment. Performed at [facility], [address] [place], [place] [address]      Scheduled Meds:  metoprolol tartrate  12.5 mg Oral BID   mometasone-formoterol  2 puff Inhalation BID   mupirocin ointment  1 Application Nasal BID   Continuous Infusions:  cefTRIAXone (ROCEPHIN)  IV Stopped (08/31/23 0007)   dextrose 5 % and 0.9 % NaCl with KCl 40 mEq/L 75 mL/hr at 08/31/23 0441   doxycycline (VIBRAMYCIN) IV Stopped (08/31/23 0245)    Procedures/Studies: CT HIP LEFT WO CONTRAST  Result Date: 08/30/2023 CLINICAL DATA:  Fracture of the left hip. EXAM: CT OF THE LEFT HIP WITHOUT CONTRAST TECHNIQUE: Multidetector CT imaging of the left hip was performed according to the standard protocol. Multiplanar CT image reconstructions were also generated. RADIATION DOSE REDUCTION: This exam was performed according to the departmental dose-optimization program which includes automated exposure control, adjustment of the mA and/or kV according to patient size and/or use of iterative reconstruction technique. COMPARISON:  Left hip radiograph dated 08/30/2023. FINDINGS: Bones/Joint/Cartilage There is a displaced fracture of the left femoral  neck. There is proximal migration of the femoral shaft with impaction on the lateral femoral head. No dislocation. The bones are osteopenic. Age indeterminate fracture of the right pubic bone adjacent to the pubic symphysis. Ligaments Suboptimally assessed by CT. Muscles and Tendons No intramuscular fluid collection or hematoma. Soft tissues No acute findings.  Aortoiliac atherosclerotic disease. IMPRESSION: 1. Displaced fracture of the left femoral neck. 2. Age indeterminate fracture of the right pubic bone adjacent to the pubic symphysis. 3.  Aortic Atherosclerosis (ICD10-I70.0). Electronically Signed   By: Elgie Collard M.D.   On: 08/30/2023 22:23   DG Chest 1 View  Result Date: 08/30/2023 CLINICAL DATA:  Fall. EXAM: CHEST  1 VIEW COMPARISON:  July 21, 2023. FINDINGS: The heart size and mediastinal contours are within normal limits. Left lung is clear. Mild right perihilar atelectasis or infiltrate is noted. Small right  pleural effusion is noted. The visualized skeletal structures are unremarkable. IMPRESSION: Mild right perihilar atelectasis or infiltrate is noted. Small right pleural effusion. Electronically Signed   By: Lupita Raider M.D.   On: 08/30/2023 18:45   DG Hip Unilat With Pelvis 2-3 Views Left  Result Date: 08/30/2023 CLINICAL DATA:  Left hip pain after fall. EXAM: DG HIP (WITH OR WITHOUT PELVIS) 2-3V LEFT COMPARISON:  None Available. FINDINGS: Moderately displaced proximal left femoral neck fracture is noted. IMPRESSION: Moderately displaced proximal left femoral neck fracture. Electronically Signed   By: Lupita Raider M.D.   On: 08/30/2023 18:43    Catarina Hartshorn, DO  [facility]  If 7PM-7AM, please contact night-coverage [email or web address] Password [email or web address] 08/31/2023, 8:12 AM   LOS: 1 day

## 2023-08-31 NOTE — Plan of Care (Signed)

## 2023-08-31 NOTE — Anesthesia Preprocedure Evaluation (Signed)
Anesthesia Evaluation  Patient identified by MRN, date of birth, ID band Patient awake    Reviewed: Allergy & Precautions, H&P , NPO status , Patient's Chart, lab work & pertinent test results, reviewed documented beta blocker date and time   History of Anesthesia Complications (+) PONV and history of anesthetic complications  Airway Mallampati: II  TM Distance: >3 FB Neck ROM: full    Dental no notable dental hx.    Pulmonary neg pulmonary ROS, pneumonia, COPD, Current Smoker   Pulmonary exam normal breath sounds clear to auscultation       Cardiovascular Exercise Tolerance: Good hypertension, negative cardio ROS + Valvular Problems/Murmurs  Rhythm:regular Rate:Normal     Neuro/Psych negative neurological ROS  negative psych ROS   GI/Hepatic negative GI ROS, Neg liver ROS,,,  Endo/Other  negative endocrine ROS    Renal/GU negative Renal ROS  negative genitourinary   Musculoskeletal   Abdominal   Peds  Hematology negative hematology ROS (+) Blood dyscrasia, anemia   Anesthesia Other Findings   Reproductive/Obstetrics negative OB ROS                             Anesthesia Physical Anesthesia Plan  ASA: 4 and emergent  Anesthesia Plan: Spinal   Post-op Pain Management:    Induction:   PONV Risk Score and Plan: Propofol infusion  Airway Management Planned:   Additional Equipment:   Intra-op Plan:   Post-operative Plan:   Informed Consent: I have reviewed the patients History and Physical, chart, labs and discussed the procedure including the risks, benefits and alternatives for the proposed anesthesia with the patient or authorized representative who has indicated his/her understanding and acceptance.     Dental Advisory Given  Plan Discussed with: CRNA  Anesthesia Plan Comments:        Anesthesia Quick Evaluation

## 2023-08-31 NOTE — Progress Notes (Signed)
Patient arrived to her room. Alert and oriented x4. VSS.bell within reach and bed alarm on.

## 2023-08-31 NOTE — Anesthesia Procedure Notes (Signed)
Procedure Name: Intubation Date/Time: 08/31/2023 11:06 AM  Performed by: Lorin Glass, CRNAPre-anesthesia Checklist: Patient identified, Emergency Drugs available, Suction available and Patient being monitored Patient Re-evaluated:Patient Re-evaluated prior to induction Oxygen Delivery Method: Circle system utilized Preoxygenation: Pre-oxygenation with 100% oxygen Induction Type: IV induction Ventilation: Mask ventilation without difficulty Laryngoscope Size: Mac and 3 Grade View: Grade I Tube type: Oral Tube size: 7.0 mm Number of attempts: 1 Airway Equipment and Method: Stylet Placement Confirmation: ETT inserted through vocal cords under direct vision, positive ETCO2 and breath sounds checked- equal and bilateral Secured at: 20 cm Tube secured with: Tape Dental Injury: Teeth and Oropharynx as per pre-operative assessment

## 2023-08-31 NOTE — Consult Note (Signed)
ORTHOPAEDIC CONSULTATION  REQUESTING PHYSICIAN: Tat, Onalee Hua, MD  ASSESSMENT AND PLAN: 89 y.o. female with the following: Left Hip Displaced femoral neck fracture  This patient requires inpatient admission to the hospitalist, to include preoperative clearance and perioperative medical management  - Weight Bearing Status/Activity: NWB Left lower extremity  - Additional recommended labs/tests: Preop Labs: CBC, BMP, PT/INR, Chest XR, and EKG  -VTE Prophylaxis: Please hold prior to OR; to resume POD#1 at the discretion of the primary team  - Pain control: Recommend PO pain medications PRN; judicious use of narcotics  - Follow-up plan: F/u 10-14 days postop  -Procedures: Plan for OR once patient has been medically optimized. Scheduled for 10/16 around 11 am  Plan for Left Hip hemiarthroplasty     Chief Complaint: Left hip pain  HPI: Kathleen Pope is a 89 y.o. female who presented to the ED for evaluation after sustaining a mechanical fall.  She was putting groceries away in her kitchen, when she slipped on a rug.  She had immediate pain.  She tried to stand up.  She was unable.  She was able to get to a phone, and contacted EMS.  She was subsequently brought to the emergency department for evaluation.  She continues to have pain in the left hip.  It is painful when she moves.  She is comfortable when she is lying still.  She denies numbness and tingling.  She lives at home.  She does not use an assistive device.  She has a nephew who checks on her.   Past Medical History:  Diagnosis Date   Anemia    pt denies, yet takes iron   Chronic airway obstruction, not elsewhere classified    Complication of anesthesia    COPD (chronic obstructive pulmonary disease) (HCC)    Diverticulosis    GI hemorrhage from post-treatment cecal ulcer 08/18/2012   Hemorrhoids    HTN (hypertension)    Hypercholesteremia    Lower GI bleed multiple   AVM's   Lung cancer (HCC) 02/14/2023   PONV  (postoperative nausea and vomiting)    Past Surgical History:  Procedure Laterality Date   BRONCHIAL BIOPSY  02/14/2023   Procedure: BRONCHIAL BIOPSIES;  Surgeon: Leslye Peer, MD;  Location: [facility] ENDOSCOPY;  Service: Pulmonary;;   BRONCHIAL BRUSHINGS  02/14/2023   Procedure: BRONCHIAL BRUSHINGS;  Surgeon: Leslye Peer, MD;  Location: [facility] ENDOSCOPY;  Service: Pulmonary;;   BRONCHIAL NEEDLE ASPIRATION BIOPSY  02/14/2023   Procedure: BRONCHIAL NEEDLE ASPIRATION BIOPSIES;  Surgeon: Leslye Peer, MD;  Location: [facility] ENDOSCOPY;  Service: Pulmonary;;   BRONCHIAL WASHINGS  02/14/2023   Procedure: BRONCHIAL WASHINGS;  Surgeon: Leslye Peer, MD;  Location: [facility] ENDOSCOPY;  Service: Pulmonary;;   BUNIONECTOMY     left   COLECTOMY     COLON RESECTION N/A 01/17/2021   Procedure: EXPLORATORY LAPAROTOMY COMPLETION COLECTOMY ileostomy;  Surgeon: Axel Filler, MD;  Location: [facility] ORS;  Service: General;  Laterality: N/A;   COLONOSCOPY  08/10/2012   Procedure: COLONOSCOPY;  Surgeon: Meryl Dare, MD,FACG;  Location: [facility] ENDOSCOPY;  Service: Endoscopy;  Laterality: N/A;   COLONOSCOPY  08/19/2012   Procedure: COLONOSCOPY;  Surgeon: Iva Boop, MD;  Location: [facility] ENDOSCOPY;  Service: Endoscopy;  Laterality: N/A;   COLONOSCOPY N/A 03/11/2013   Procedure: COLONOSCOPY;  Surgeon: Hilarie Fredrickson, MD;  Location: [facility] ENDOSCOPY;  Service: Endoscopy;  Laterality: N/A;   COLONOSCOPY N/A 01/16/2021   Procedure: COLONOSCOPY;  Surgeon: Sherrilyn Rist, MD;  Location: [facility] ENDOSCOPY;  Service: Gastroenterology;  Laterality: N/A;   COLONOSCOPY WITH PROPOFOL N/A 01/10/2021   Procedure: COLONOSCOPY WITH PROPOFOL;  Surgeon: Hilarie Fredrickson, MD;  Location: [facility] ENDOSCOPY;  Service: Endoscopy;  Laterality: N/A;   COLOSTOMY N/A 01/15/2021   Procedure: COLOSTOMY;  Surgeon: Harriette Bouillon, MD;  Location: [facility] ORS;  Service: General;  Laterality: N/A;   ESOPHAGOGASTRODUODENOSCOPY (EGD) WITH PROPOFOL N/A 01/14/2021    Procedure: ESOPHAGOGASTRODUODENOSCOPY (EGD) WITH PROPOFOL;  Surgeon: Sherrilyn Rist, MD;  Location: [facility] ENDOSCOPY;  Service: Endoscopy;  Laterality: N/A;   FIDUCIAL MARKER PLACEMENT  02/14/2023   Procedure: FIDUCIAL MARKER PLACEMENT;  Surgeon: Leslye Peer, MD;  Location: [facility] ENDOSCOPY;  Service: Pulmonary;;   FLEXIBLE SIGMOIDOSCOPY N/A 01/14/2021   Procedure: Arnell Sieving;  Surgeon: Sherrilyn Rist, MD;  Location: [facility] ENDOSCOPY;  Service: Endoscopy;  Laterality: N/A;   HOT HEMOSTASIS N/A 01/10/2021   Procedure: HOT HEMOSTASIS (ARGON PLASMA COAGULATION/BICAP);  Surgeon: Hilarie Fredrickson, MD;  Location: [facility] ENDOSCOPY;  Service: Endoscopy;  Laterality: N/A;   IR ANGIOGRAM SELECTIVE EACH ADDITIONAL VESSEL  01/12/2021   IR ANGIOGRAM SELECTIVE EACH ADDITIONAL VESSEL  01/12/2021   IR ANGIOGRAM SELECTIVE EACH ADDITIONAL VESSEL  01/12/2021   IR ANGIOGRAM VISCERAL SELECTIVE  01/09/2021   IR ANGIOGRAM VISCERAL SELECTIVE  01/12/2021   IR EMBO ARTERIAL NOT HEMORR HEMANG INC GUIDE ROADMAPPING  01/12/2021   IR US GUIDE VASC ACCESS RIGHT  01/09/2021   IR US GUIDE VASC ACCESS RIGHT  01/12/2021   PARTIAL COLECTOMY N/A 01/15/2021   Procedure: PARTIAL COLECTOMY;  Surgeon: Harriette Bouillon, MD;  Location: [facility] ORS;  Service: General;  Laterality: N/A;   POLYPECTOMY  01/10/2021   Procedure: POLYPECTOMY;  Surgeon: Hilarie Fredrickson, MD;  Location: [facility] ENDOSCOPY;  Service: Endoscopy;;   SKIN CANCER EXCISION Right 12/2022   Right Leg above knee   TONSILLECTOMY     VIDEO BRONCHOSCOPY WITH ENDOBRONCHIAL ULTRASOUND Bilateral 02/14/2023   Procedure: VIDEO BRONCHOSCOPY WITH ENDOBRONCHIAL ULTRASOUND;  Surgeon: Leslye Peer, MD;  Location: [facility] ENDOSCOPY;  Service: Pulmonary;  Laterality: Bilateral;   VIDEO BRONCHOSCOPY WITH RADIAL ENDOBRONCHIAL ULTRASOUND  02/14/2023   Procedure: VIDEO BRONCHOSCOPY WITH RADIAL ENDOBRONCHIAL ULTRASOUND;  Surgeon: Leslye Peer, MD;  Location: [facility] ENDOSCOPY;  Service: Pulmonary;;    Social History   Socioeconomic History   Marital status: Widowed    Spouse name: Not on file   Number of children: 1   Years of education: Not on file   Highest education level: Not on file  Occupational History   Occupation: retired    Associate Professor: RETIRED  Tobacco Use   Smoking status: Every Day    Current packs/day: 1.00    Average packs/day: 1 pack/day for 59.0 years (59.0 ttl pk-yrs)    Types: Cigarettes   Smokeless tobacco: Never   Tobacco comments:    Started smoking at age 14  Vaping Use   Vaping status: Never Used  Substance and Sexual Activity   Alcohol use: No   Drug use: No   Sexual activity: Never  Other Topics Concern   Not on file  Social History Narrative   Not on file   Social Determinants of Health   Financial Resource Strain: Not on file  Food Insecurity: No Food Insecurity (08/30/2023)   Hunger Vital Sign    Worried About Running Out of Food in the Last Year: Never true    Ran Out of Food in the Last Year: Never true  Transportation Needs: No  Transportation Needs (08/30/2023)   PRAPARE - Administrator, Civil Service (Medical): No    Lack of Transportation (Non-Medical): No  Physical Activity: Not on file  Stress: Not on file  Social Connections: Not on file   Family History  Problem Relation Age of Onset   Breast cancer Mother    Lung cancer Father    Breast cancer Sister    Anuerysm Brother        in stomach   Colon cancer Neg Hx    Esophageal cancer Neg Hx    Rectal cancer Neg Hx    No Known Allergies Prior to Admission medications   Medication Sig Start Date End Date Taking? Authorizing Provider  acetaminophen (TYLENOL) 500 MG tablet Take 2 tablets (1,000 mg total) by mouth every 6 (six) hours as needed for moderate pain or mild pain. 02/14/23  Yes Leslye Peer, MD  ECHINACEA PO Take 167 mg by mouth daily.   Yes [provider]  Ferrous Sulfate 27 MG TABS Take 54 mg by mouth daily with breakfast.   Yes  [provider]  fluticasone-salmeterol (ADVAIR) 100-50 MCG/ACT AEPB Inhale 1 puff into the lungs 2 (two) times daily. 02/03/23  Yes Luciano Cutter, MD  metoprolol tartrate (LOPRESSOR) 25 MG tablet Take 0.5 tablets (12.5 mg total) by mouth 2 (two) times daily. 01/25/21  Yes Rai, Ripudeep K, MD  Multiple Vitamins-Minerals (MULTIVITAMIN WITH MINERALS) tablet Take 1 tablet by mouth daily. Centrum Silver   Yes [provider]  Omega-3 Fatty Acids (FISH OIL) 1000 MG CAPS Take 1,000 Units by mouth daily.   Yes [provider]  Potassium 99 MG TABS Take by mouth.   Yes [provider]  pravastatin (PRAVACHOL) 10 MG tablet Take 10 mg by mouth daily.   Yes [provider]   CT HIP LEFT WO CONTRAST  Result Date: 08/30/2023 CLINICAL DATA:  Fracture of the left hip. EXAM: CT OF THE LEFT HIP WITHOUT CONTRAST TECHNIQUE: Multidetector CT imaging of the left hip was performed according to the standard protocol. Multiplanar CT image reconstructions were also generated. RADIATION DOSE REDUCTION: This exam was performed according to the departmental dose-optimization program which includes automated exposure control, adjustment of the mA and/or kV according to patient size and/or use of iterative reconstruction technique. COMPARISON:  Left hip radiograph dated 08/30/2023. FINDINGS: Bones/Joint/Cartilage There is a displaced fracture of the left femoral neck. There is proximal migration of the femoral shaft with impaction on the lateral femoral head. No dislocation. The bones are osteopenic. Age indeterminate fracture of the right pubic bone adjacent to the pubic symphysis. Ligaments Suboptimally assessed by CT. Muscles and Tendons No intramuscular fluid collection or hematoma. Soft tissues No acute findings.  Aortoiliac atherosclerotic disease. IMPRESSION: 1. Displaced fracture of the left femoral neck. 2. Age indeterminate fracture of the right pubic bone adjacent to the pubic  symphysis. 3.  Aortic Atherosclerosis (ICD10-I70.0). Electronically Signed   By: Elgie Collard M.D.   On: 08/30/2023 22:23   DG Chest 1 View  Result Date: 08/30/2023 CLINICAL DATA:  Fall. EXAM: CHEST  1 VIEW COMPARISON:  July 21, 2023. FINDINGS: The heart size and mediastinal contours are within normal limits. Left lung is clear. Mild right perihilar atelectasis or infiltrate is noted. Small right pleural effusion is noted. The visualized skeletal structures are unremarkable. IMPRESSION: Mild right perihilar atelectasis or infiltrate is noted. Small right pleural effusion. Electronically Signed   By: Zenda Alpers.D.  On: 08/30/2023 18:45   DG Hip Unilat With Pelvis 2-3 Views Left  Result Date: 08/30/2023 CLINICAL DATA:  Left hip pain after fall. EXAM: DG HIP (WITH OR WITHOUT PELVIS) 2-3V LEFT COMPARISON:  None Available. FINDINGS: Moderately displaced proximal left femoral neck fracture is noted. IMPRESSION: Moderately displaced proximal left femoral neck fracture. Electronically Signed   By: Lupita Raider M.D.   On: 08/30/2023 18:43    Family History Reviewed and non-contributory, no pertinent history of problems with bleeding or anesthesia    Review of Systems No fevers or chills No numbness or tingling No chest pain No shortness of breath No bowel or bladder dysfunction No GI distress No headaches    OBJECTIVE  Vitals:Patient Vitals for the past 8 hrs:  BP Temp Pulse Resp SpO2  08/31/23 0718 -- -- -- -- 93 %  08/31/23 0513 120/67 98.6 F (37 C) 78 16 93 %   General: Alert, no acute distress Cardiovascular: Extremities are warm Respiratory: No cyanosis, no use of accessory musculature Skin: No lesions in the area of chief complaint  Neurologic: Sensation intact distally  Psychiatric: Patient is competent for consent with normal mood and affect Lymphatic: No swelling obvious and reported other than the area involved in the exam below Extremities  LLE:  Extremity held in a fixed position.  ROM deferred due to known fracture.  Sensation is intact distally in the sural, saphenous, DP, SP, and plantar nerve distribution. 2+ DP pulse.  Toes are WWP.  Active motion intact in the TA/EHL/GS. RLE: Sensation is intact distally in the sural, saphenous, DP, SP, and plantar nerve distribution. 2+ DP pulse.  Toes are WWP.  Active motion intact in the TA/EHL/GS. Tolerates gentle ROM of the hip.  No pain with axial loading.     Test Results Imaging XR and CT scan of the Left hip demonstrates a Displaced femoral neck fracture.  Labs cbc Recent Labs    08/30/23 1708 08/31/23 0422  WBC 14.6* 11.2*  HGB 12.4 11.1*  HCT 35.5* 32.8*  PLT 321 295     Labs coag Recent Labs    08/30/23 1708  INR 0.9    Recent Labs    08/30/23 1708 08/31/23 0422  NA 134* 134*  K 3.2* 4.8  CL 98 101  CO2 24 24  GLUCOSE 131* 142*  BUN 11 13  CREATININE 0.75 0.65  CALCIUM 9.0 8.3*

## 2023-08-31 NOTE — Hospital Course (Addendum)
89 year old female with a history of right-sided lung cancer s/p XRT (03/31/23-04/12/23), tobacco abuse, COPD, GI bleed status post total colectomy end ileostomy, hypertension presenting with a mechanical fall resulting in subsequent left hip pain.  The patient was putting away her groceries when she felt like her legs got tangled up resulting in a mechanical fall.  She had denied any loss of consciousness.  The patient tried to stand again, but unfortunately sustained another fall. The patient denies any fevers, chills, chest pain, shortness breath, nausea, vomiting or diarrhea, abdominal pain, dysuria.  She did have 1 episode of emesis in the ED.  There was no blood.  She does report chronic dyspnea on exertion and coughing. In the ED, the patient was afebrile and hemodynamically stable with oxygen saturation down to 84% on room air.  She was placed on 2 L with saturation up to 94%.  WBC 14.6, hemoglobin 12.4, platelets 295.  Potassium was 3.2.  Serum creatinine 0.75.  COVID-19 PCR is negative.  UA was negative for pyuria.  EKG showed sinus rhythm with nonspecific ST changes.  CT of the left hip showed a displaced left femoral neck fracture.  There was an age-indeterminate right pubic bone fracture.  Chest x-ray showed right perihilar opacity. Orthopedic surgery, Dr. Dallas Schimke was consulted to assist.

## 2023-08-31 NOTE — Progress Notes (Signed)
Date and time results received: 08/31/23 4:23 AM  (use smartphrase ".now" to insert current time)  Test: PCR Critical Value: positive for MRSA and staphlococcus  Name of Provider Notified:   Orders Received? Or Actions Taken?:  standing orders

## 2023-08-31 NOTE — Plan of Care (Signed)
Problem: Nutrition: Goal: Adequate nutrition will be maintained Outcome: Progressing   Problem: Coping: Goal: Level of anxiety will decrease Outcome: Progressing   Problem: Nutrition: Goal: Adequate nutrition will be maintained Outcome: Progressing

## 2023-08-31 NOTE — Progress Notes (Signed)
   08/31/23 1007  TOC Brief Assessment  Insurance and Status Reviewed  Patient has primary care physician Yes  Home environment has been reviewed Yes  Prior level of function: Independent  Prior/Current Home Services No current home services  Social Determinants of Health Reivew SDOH reviewed no interventions necessary  Readmission risk has been reviewed Yes  Transition of care needs transition of care needs identified, TOC will continue to follow   Pt with hip fracture. PT/OT to evaluate. TOC will follow for potential SNF/HH needs.

## 2023-08-31 NOTE — Transfer of Care (Signed)
Immediate Anesthesia Transfer of Care Note  Patient: Kathleen Pope  Procedure(s) Performed: ARTHROPLASTY BIPOLAR HIP (HEMIARTHROPLASTY) (Left: Hip)  Patient Location: PACU  Anesthesia Type:General  Level of Consciousness: drowsy  Airway & Oxygen Therapy: Patient Spontanous Breathing and Patient connected to face mask oxygen  Post-op Assessment: Report given to RN and Post -op Vital signs reviewed and stable  Post vital signs: Reviewed and stable  Last Vitals:  Vitals Value Taken Time  BP 117/69 08/31/23 1308  Temp 99.1   Pulse 87 08/31/23 1311  Resp 12 08/31/23 1311  SpO2 94 % 08/31/23 1311  Vitals shown include unfiled device data.  Last Pain:  Vitals:   08/31/23 0951  TempSrc: Oral  PainSc: 4       Patients Stated Pain Goal: 2 (08/31/23 0908)  Complications: No notable events documented.

## 2023-08-31 NOTE — Op Note (Signed)
Orthopaedic Surgery Operative Note (CSN: [id number])  Kathleen Pope  10/07/1933 Date of Surgery: 08/31/2023   Diagnoses:  Displaced left femoral neck fracture  Procedure: Left hip hemiarthroplasty   Operative Finding Successful completion of the planned procedure.      Post-Op Diagnosis: Same Surgeons:Primary: Oliver Barre, MD Assistants: Cecile Sheerer Location: [facility] OR ROOM 4 Anesthesia: General with local anesthesia Antibiotics: Ancef 2 g with local vancomycin powder 1 g at the surgical site Tourniquet time: N/A Estimated Blood Loss: 200 cc Complications: None Specimens: None  Implants: Implant Name Type Inv. Item Serial No. Manufacturer Lot No. LRB No. Used Action  HEAD FEM STD 28X+1.5 STRL - [id number] Hips HEAD FEM STD 28X+1.5 STRL  [employer]PU[employer] [id number] Left 1 Implanted  ACTIS DUOFIX HIP PROTHESIS FEMORAL STEM 12/14 TAPER SIZE 6 Stem   DEPUY ORTH[employer] [id number] Left 1 Implanted  BIPOLAR PROS AML 44 - [id number] Hips BIPOLAR PROS AML 44  DEPUY ORTH[employer] [id number] Left 1 Implanted    Indications for Surgery:   Kathleen Pope is a 89 y.o. female who sustained a displaced Left femoral neck fracture.  In order to restore previous form and function, I recommended a hip hemiarthroplasty.  Risks and benefits of operative and nonoperative management were discussed prior to surgery with the patient and informed consent form was completed.  Specific risks including infection, need for additional surgery, bleeding, damage to surrounding structures, dislocation, nonunion of the implants and more severe complications associated with anesthesia.  The patient elected to proceed.    Procedure:   The patient was identified properly. Informed consent was obtained and the surgical site was marked. The patient was taken to the OR where general anesthesia was induced.  The patient was positioned lateral, with the left hip up.  The left leg was prepped and draped in the usual sterile  fashion.  Timeout was performed before the beginning of the case.  Ancef dosing was confirmed prior to incision.  Patient received 1 g of TXA before the start of the case  We started by making an incision centered over the greater trochanter with a scalpel. We used the scalpel to continue to dissect to the fascia.  Electrocautery was used to help with hemostasis.  A cobb elevator was used to clear the IT band and then it was pierced with Bovie electrocautery. Mayo scissors were used to cut the fascia in a longitudinal fashion. The gluteus maximus fibers were bluntly split in line with their fibers.   The femur was slowly internally rotated, putting tension on the posterior structures. The short external rotators were identified and tagged with #1 vicryl.  Bovie electrocautery was used to dissect the short external rotators off of the insertion onto the femur.  Next, we identified the capsule and made a T capsulotomy.  The capsule was then tagged with #1 Vicryl sutures.   Next, we visualized the femoral neck fracture. We identified the sciatic nerve by palpation and verified that it was not in danger during the dissection.    We then made a neck cut approximately 1 cm above the lesser trochanter with a saw. We removed the femoral head. We used the box cutter to cut away some of the greater trochanter for ease of insertion of the stem. We then used the canal finder to locate the femoral canal.   Using the angle of the femoral neck as our guide for version, we broached sequentially. We trialed components and found appropriate fit and stability.  The patient's bone was stable enough to get an excellent pressfit with the stem.  We placed an appropriately sized head on the stem.  The hip was reduced and we noted full extension of the hip. The hip was stable at 90 degrees of flexion, and 45 degrees of internal rotation.  Leg lengths were approximately equal.  We then removed the trials as well as the broach. We  ensured there were no foreign bodies or bone within the acetabulum. We copiously irrigated the wound.    The appropriately sized stem was then opened and this was pressfit within the canal.  We achieved excellent fit and the stem was stable.  An appropriately sized head was placed on to the stem and the hip was reduced. Again, we noted it was stable in the previously mentioned manipulations.   The wound was copiously irrigated again.  We place 1 g of Vancomycin powder within the hip.  Next,  we repaired the posterior capsule.  The short external rotators were repaired to the greater trochanter through bone tunnels. We closed the fascia of the iliotibial band and gluteus maximus with running and intterupted Vicryl sutures. We then closed Scarpa's fascia with Vicryl sutures. Skin was closed with 2-0 monocryl and staples and an Aquacel dressing was placed.  A hib abduction pillow was secured.  Patient was awoken taken to PACU in stable condition.   Post-operative plan:  The patient will be WBAT on the operative extremity. Hip abduction pillow in place at all times when in bed Posterior hip precautions PT/OT    DVT prophylaxis: recommend Aspirin 81 mg twice daily for 6 weeks.    Pain control with PRN pain medication preferring oral medicines.   Follow up plan will be scheduled in approximately 14 days for incision check and XR.

## 2023-09-01 ENCOUNTER — Inpatient Hospital Stay (HOSPITAL_COMMUNITY): Payer: Medicare Other

## 2023-09-01 DIAGNOSIS — R6520 Severe sepsis without septic shock: Secondary | ICD-10-CM | POA: Diagnosis not present

## 2023-09-01 DIAGNOSIS — S72002D Fracture of unspecified part of neck of left femur, subsequent encounter for closed fracture with routine healing: Secondary | ICD-10-CM | POA: Diagnosis not present

## 2023-09-01 DIAGNOSIS — C3491 Malignant neoplasm of unspecified part of right bronchus or lung: Secondary | ICD-10-CM | POA: Diagnosis not present

## 2023-09-01 DIAGNOSIS — A419 Sepsis, unspecified organism: Secondary | ICD-10-CM | POA: Diagnosis not present

## 2023-09-01 LAB — BASIC METABOLIC PANEL
Anion gap: 10 (ref 5–15)
BUN: 17 mg/dL (ref 8–23)
CO2: 22 mmol/L (ref 22–32)
Calcium: 8 mg/dL — ABNORMAL LOW (ref 8.9–10.3)
Chloride: 102 mmol/L (ref 98–111)
Creatinine, Ser: 0.61 mg/dL (ref 0.44–1.00)
GFR, Estimated: >60 mL/min (ref >60)
Glucose, Bld: 116 mg/dL — ABNORMAL HIGH (ref 70–99)
Potassium: 4 mmol/L (ref 3.5–5.1)
Sodium: 134 mmol/L — ABNORMAL LOW (ref 135–145)

## 2023-09-01 LAB — CBC
HCT: 29.3 % — ABNORMAL LOW (ref 36.0–46.0)
Hemoglobin: 9.8 g/dL — ABNORMAL LOW (ref 12.0–15.0)
MCH: 33.1 pg (ref 26.0–34.0)
MCHC: 33.4 g/dL (ref 30.0–36.0)
MCV: 99 fL (ref 80.0–100.0)
Platelets: 252 10*3/uL (ref 150–400)
RBC: 2.96 MIL/uL — ABNORMAL LOW (ref 3.87–5.11)
RDW: 14.4 % (ref 11.5–15.5)
WBC: 10.9 10*3/uL — ABNORMAL HIGH (ref 4.0–10.5)
nRBC: 0 % (ref 0.0–0.2)

## 2023-09-01 MED ORDER — HYDROCODONE-ACETAMINOPHEN 5-325 MG PO TABS
1.0000 | ORAL_TABLET | Freq: Four times a day (QID) | ORAL | Status: DC | PRN
  Administered 2023-09-01 – 2023-09-03 (×4): 1 via ORAL
  Filled 2023-09-01 (×4): qty 1

## 2023-09-01 NOTE — Consult Note (Addendum)
WOC Nurse ostomy consult note Consult requested to provide ostomy supplies.  Performed remotely after review of progress notes.   Pt is familiar to [facility] team from previous admission.  Stoma type/location: Pt had ileostomy surgery performed in 2022 and is independent with pouch application and emptying. Orders provided for bedside nurses to assist with changing or emptying the pouch PRN while in the hospital: Use Supplies: barrier ring, [place] # [phone] and one piece pouch [place] # [phone]. Please re-consult if further assistance is needed.  Thank-you,  Cammie Mcgee MSN, RN, CWOCN, [place], CNS [phone]

## 2023-09-01 NOTE — Plan of Care (Signed)

## 2023-09-01 NOTE — [place] FL2 (Signed)
[place] MEDICAID FL2 LEVEL OF CARE FORM     IDENTIFICATION  Patient Name: Kathleen Pope Birthdate: 16-Mar-1933 Sex: female Admission Date (Current Location): 08/30/2023  [facility] and [place] Number:  [employer] and Address:  [facility],  [address] [place] [address]      Provider Number: [id number]  Attending Physician Name and Address:  Catarina Hartshorn, MD  Relative Name and Phone Number:       Current Level of Care: Hospital Recommended Level of Care: Skilled Nursing Facility Prior Approval Number:    Date Approved/Denied:   PASRR Number: [id number]  Discharge Plan: SNF    Current Diagnoses: Patient Active Problem List   Diagnosis Date Noted   Closed left hip fracture (HCC) 08/30/2023   CAP (community acquired pneumonia) 08/30/2023   COPD (chronic obstructive pulmonary disease) (HCC) 08/30/2023   Prolonged QT interval 08/30/2023   Severe sepsis (HCC) 08/30/2023   Hyponatremia 07/19/2023   Pressure injury of skin 07/19/2023   SBO (small bowel obstruction) (HCC) 07/18/2023   Lung mass 02/05/2023   Bronchogenic cancer of right lung (HCC) 01/16/2023   Cavitating mass in right upper lung lobe 01/02/2023   Acute hypoxic respiratory failure (HCC) 05/12/2022   Hypokalemia 01/27/2021   Ileostomy in place (HCC) 01/24/2021   High output ileostomy (HCC) 01/24/2021   Protein-calorie malnutrition, severe 01/21/2021   Ischemic colitis with LGIB s/p colectomies & ileostomy 3/3 & 01/17/2021 01/19/2021   Fluid overload 01/19/2021   Hemorrhagic shock (HCC) 01/19/2021   Benign neoplasm of cecum    Benign neoplasm of ascending colon    Abnormal CT of the abdomen    Squamous cell carcinoma of skin of right middle finger 12/11/2020   Pleural effusion on right 11/27/2020   Loose stools 04/06/2017   De Quervain's disease (tenosynovitis) 03/14/2014   Hematochezia 03/09/2013   External hemorrhoids 03/08/2013   GI hemorrhage at cecal ulcer s/p  colectomy/ileostomy 01/17/2021 08/18/2012   AVM (arteriovenous malformation) of colon 08/18/2012   HTN (hypertension) 08/18/2012   Acute blood loss anemia 08/18/2012   Bruit 08/04/2012   Mixed hyperlipidemia 08/04/2012   Cigarette smoker 08/04/2012   Murmur 08/04/2012    Orientation RESPIRATION BLADDER Height & Weight     Self, Time, Situation, Place  Normal Continent Weight: 92 lb 9.5 oz (42 kg) Height:  5\' 1"  (154.9 cm)  BEHAVIORAL SYMPTOMS/MOOD NEUROLOGICAL BOWEL NUTRITION STATUS      Continent Diet (see dc summary)  AMBULATORY STATUS COMMUNICATION OF NEEDS Skin   Extensive Assist Verbally Surgical wounds                       Personal Care Assistance Level of Assistance  Bathing, Feeding, Dressing Bathing Assistance: Limited assistance Feeding assistance: Independent Dressing Assistance: Limited assistance     Functional Limitations Info  Sight, Hearing, Speech Sight Info: Adequate Hearing Info: Adequate Speech Info: Adequate    SPECIAL CARE FACTORS FREQUENCY  PT (By licensed PT), OT (By licensed OT)     PT Frequency: 5x week OT Frequency: 5x week            Contractures Contractures Info: Not present    Additional Factors Info  Code Status, Allergies Code Status Info: DNR Allergies Info: NKA           Current Medications (09/01/2023):  This is the current hospital active medication list Current Facility-Administered Medications  Medication Dose Route Frequency Provider Last Rate Last Admin   acetaminophen (TYLENOL)  tablet 650 mg  650 mg Oral Q6H PRN Oliver Barre, MD   650 mg at 09/01/23 1002   Or   acetaminophen (TYLENOL) suppository 650 mg  650 mg Rectal Q6H PRN Oliver Barre, MD       cefTRIAXone (ROCEPHIN) 2 g in sodium chloride 0.9 % 100 mL IVPB  2 g Intravenous Q24H Thane Edu A, MD 200 mL/hr at 08/31/23 2201 2 g at 08/31/23 2201   Chlorhexidine Gluconate Cloth 2 % PADS 6 each  6 each Topical Daily Tat, David, MD   6 each at 08/31/23  1753   doxycycline (VIBRAMYCIN) 100 mg in sodium chloride 0.9 % 250 mL IVPB  100 mg Intravenous Q12H Oliver Barre, MD 125 mL/hr at 09/01/23 1005 100 mg at 09/01/23 1005   fentaNYL (SUBLIMAZE) injection 12.5 mcg  12.5 mcg Intravenous Q2H PRN Oliver Barre, MD   12.5 mcg at 09/01/23 0814   ipratropium-albuterol (DUONEB) 0.5-2.5 (3) MG/3ML nebulizer solution 3 mL  3 mL Nebulization Q4H PRN Oliver Barre, MD       metoprolol tartrate (LOPRESSOR) tablet 12.5 mg  12.5 mg Oral BID Thane Edu A, MD   12.5 mg at 09/01/23 1003   mometasone-formoterol (DULERA) 100-5 MCG/ACT inhaler 2 puff  2 puff Inhalation BID Oliver Barre, MD   2 puff at 09/01/23 6045   mupirocin ointment (BACTROBAN) 2 % 1 Application  1 Application Nasal BID Oliver Barre, MD   1 Application at 08/31/23 2202   promethazine (PHENERGAN) tablet 12.5 mg  12.5 mg Oral Q6H PRN Oliver Barre, MD         Discharge Medications: Please see discharge summary for a list of discharge medications.  Relevant Imaging Results:  Relevant Lab Results:   Additional Information SSN: [id number], [place]

## 2023-09-01 NOTE — Progress Notes (Signed)
   ORTHOPAEDIC PROGRESS NOTE  s/p Procedure(s): Left ARTHROPLASTY BIPOLAR HIP (HEMIARTHROPLASTY)  DOS: 08/31/23  SUBJECTIVE: No issues over night.  She has worked with PT.  Plan for the [facility].  Complaining of right hip pain, but no falls or stumbles since surgery.   OBJECTIVE: PE:  Alert and oriented, no acute distress Small amount of strike through on the dressing.  Blood is dried in this area.  Wiggles toes Sensation intact distally Hip Abduction pillow is in place  Vitals:   09/01/23 0812 09/01/23 0959  BP: 135/74 (!) 150/81  Pulse: 99   Resp:    Temp: 99.2 F (37.3 C)   SpO2: 96%       Latest Ref Rng & Units 09/01/2023    4:29 AM 08/31/2023    4:22 AM 08/30/2023    5:08 PM  CBC  WBC 4.0 - 10.5 K/uL 10.9  11.2  14.6   Hemoglobin 12.0 - 15.0 g/dL 9.8  28.4  13.2   Hematocrit 36.0 - 46.0 % 29.3  32.8  35.5   Platelets 150 - 400 K/uL 252  295  321      ASSESSMENT: Kathleen Pope is a 89 y.o. female doing well postoperatively.  Complaining of right groin pain.  No falls or stumbles since surgery.    XR of right hip from PACU without acute injury in the right hip or pelvis  PLAN: Weightbearing: WBAT LLE Incisional and dressing care: Reinforce dressings as needed Orthopedic device(s):  Hip abduction pillow in place at all times when in bed.  Posterior hip precautions VTE prophylaxis: Aspirin 81mg  BID  6 weeks postop Pain control: As needed Follow - up plan: 2 weeks  Staples out/  New XR  Contact information:     Marche Hottenstein A. Dallas Schimke, MD MS [facility] [address] [place],  [place]  [address] Phone: [phone] Fax: [phone]

## 2023-09-01 NOTE — Progress Notes (Signed)
PROGRESS NOTE  Kathleen Pope EVO:[id number] DOB: Jun 08, 1933 DOA: 08/30/2023 PCP: Elfredia Nevins, MD  Brief History:  89 year old female with a history of right-sided lung cancer s/p XRT (03/31/23-04/12/23), tobacco abuse, COPD, GI bleed status post total colectomy end ileostomy, hypertension presenting with a mechanical fall resulting in subsequent left hip pain.  The patient was putting away her groceries when she felt like her legs got tangled up resulting in a mechanical fall.  She had denied any loss of consciousness.  The patient tried to stand again, but unfortunately sustained another fall. The patient denies any fevers, chills, chest pain, shortness breath, nausea, vomiting or diarrhea, abdominal pain, dysuria.  She did have 1 episode of emesis in the ED.  There was no blood.  She does report chronic dyspnea on exertion and coughing. In the ED, the patient was afebrile and hemodynamically stable with oxygen saturation down to 84% on room air.  She was placed on 2 L with saturation up to 94%.  WBC 14.6, hemoglobin 12.4, platelets 295.  Potassium was 3.2.  Serum creatinine 0.75.  COVID-19 PCR is negative.  UA was negative for pyuria.  EKG showed sinus rhythm with nonspecific ST changes.  CT of the left hip showed a displaced left femoral neck fracture.  There was an age-indeterminate right pubic bone fracture.  Chest x-ray showed right perihilar opacity. Orthopedic surgery, Dr. Dallas Schimke was consulted to assist.   Assessment/Plan: Left femoral neck fracture -Orthopedic surgery consult appreciated -08/30/2023 CT left hip as discussed above -08/31/23--left hip hemiarthroplasty -Judicious opioids -PT>>SNF   Severe sepsis -Presented with tachycardia, leukocytosis, and hypoxia -Secondary to pneumonia   Lobar pneumonia -Personally reviewed chest x-ray-- RLL opacity -Check PCT <0.10 -Lactic acid 1.2 -Continue ceftriaxone and azithromycin  Right hip pain -10/17--pt complains right  hip pain after getting up in chair -repeat xray pelvis -discussed with Dr. Dallas Schimke   Acute respiratory failure with hypoxia -Secondary to pneumonia -Wean oxygen as tolerated for saturation greater 92% -Stable on 3-4 L   COPD -Continue Dulera -start duo nebs and brovana   Bronchogenic cancer of the right lung -Status post radiation -Follow-up Dr. Ellin Saba   Hypokalemia -Replete -Magnesium 1.9   Essential hypertension -Continue home dose metoprolol tartrate                     Family Communication:   no Family at bedside   Consultants:  orthopedic surgery   Code Status:   DNR   DVT Prophylaxis:  [place] lovenox     Procedures: As Listed in Progress Note Above   Antibiotics: Ceftriaxone 10/15>> Doxy 10/15>>                Subjective: Pt complains of right hip pain. Denies f/c, cp, n/v/d.  No new fall  Objective: Vitals:   09/01/23 0519 09/01/23 0722 09/01/23 0812 09/01/23 0959  BP: 131/79  135/74 (!) 150/81  Pulse: 83  99   Resp: 18     Temp: 98.9 F (37.2 C)  99.2 F (37.3 C)   TempSrc:   Oral   SpO2: 95% 96% 96%   Weight:      Height:        Intake/Output Summary (Last 24 hours) at 09/01/2023 1228 Last data filed at 09/01/2023 0500 Gross per 24 hour  Intake 840 ml  Output 975 ml  Net -135 ml   Weight change: 0.269 kg Exam:  General:  Pt is  alert, follows commands appropriately, not in acute distress HEENT: No icterus, No thrush, No neck mass, [place]/AT Cardiovascular: RRR, S1/S2, no rubs, no gallops Respiratory: diminished BS.  Bibasilar crackles. No wheeze Abdomen: Soft/+BS, non tender, non distended, no guarding Extremities: No edema, No lymphangitis, No petechiae, No rashes, no synovitis   Data Reviewed: I have personally reviewed following labs and imaging studies Basic Metabolic Panel: Recent Labs  Lab 08/30/23 1708 08/31/23 0422 09/01/23 0429  NA 134* 134* 134*  K 3.2* 4.8 4.0  CL 98 101 102  CO2 24 24 22    GLUCOSE 131* 142* 116*  BUN 11 13 17   CREATININE 0.75 0.65 0.61  CALCIUM 9.0 8.3* 8.0*  MG 1.9  --   --    Liver Function Tests: No results for input(s): "AST", "ALT", "ALKPHOS", "BILITOT", "PROT", "ALBUMIN" in the last 168 hours. No results for input(s): "LIPASE", "AMYLASE" in the last 168 hours. No results for input(s): "AMMONIA" in the last 168 hours. Coagulation Profile: Recent Labs  Lab 08/30/23 1708  INR 0.9   CBC: Recent Labs  Lab 08/30/23 1708 08/31/23 0422 09/01/23 0429  WBC 14.6* 11.2* 10.9*  NEUTROABS 12.9*  --   --   HGB 12.4 11.1* 9.8*  HCT 35.5* 32.8* 29.3*  MCV 96.2 98.5 99.0  PLT 321 295 252   Cardiac Enzymes: No results for input(s): "CKTOTAL", "CKMB", "CKMBINDEX", "TROPONINI" in the last 168 hours. BNP: Invalid input(s): "POCBNP" CBG: Recent Labs  Lab 08/31/23 0952  GLUCAP 141*   HbA1C: No results for input(s): "HGBA1C" in the last 72 hours. Urine analysis:    Component Value Date/Time   COLORURINE YELLOW 08/30/2023 2220   APPEARANCEUR CLEAR 08/30/2023 2220   LABSPEC 1.014 08/30/2023 2220   PHURINE 6.0 08/30/2023 2220   GLUCOSEU 50 (A) 08/30/2023 2220   HGBUR NEGATIVE 08/30/2023 2220   BILIRUBINUR NEGATIVE 08/30/2023 2220   KETONESUR 5 (A) 08/30/2023 2220   PROTEINUR 30 (A) 08/30/2023 2220   UROBILINOGEN 1.0 03/09/2013 2028   NITRITE NEGATIVE 08/30/2023 2220   LEUKOCYTESUR NEGATIVE 08/30/2023 2220   Sepsis Labs: @LABRCNTIP (procalcitonin:4,lacticidven:4) ) Recent Results (from the past 240 hour(s))  Culture, blood (Routine X 2) w Reflex to ID Panel     Status: None (Preliminary result)   Collection Time: 08/30/23 10:02 PM   Specimen: BLOOD  Result Value Ref Range Status   Specimen Description BLOOD BLOOD LEFT ARM  Final   Special Requests   Final    BOTTLES DRAWN AEROBIC AND ANAEROBIC Blood Culture adequate volume   Culture   Final    NO GROWTH 2 DAYS Performed at [facility], [address] [place], [place] [address]     Report Status PENDING  Incomplete  Culture, blood (Routine X 2) w Reflex to ID Panel     Status: None (Preliminary result)   Collection Time: 08/30/23 10:04 PM   Specimen: BLOOD  Result Value Ref Range Status   Specimen Description BLOOD BLOOD LEFT ARM  Final   Special Requests   Final    BOTTLES DRAWN AEROBIC AND ANAEROBIC Blood Culture adequate volume   Culture   Final    NO GROWTH 2 DAYS Performed at [facility], [address] [place], [place] [address]    Report Status PENDING  Incomplete  SARS Coronavirus 2 by RT PCR (hospital order, performed in [facility] hospital lab) *cepheid single result test* Anterior Nasal Swab     Status: None   Collection Time: 08/30/23 10:30 PM   Specimen: Anterior  Nasal Swab  Result Value Ref Range Status   SARS Coronavirus 2 by RT PCR NEGATIVE NEGATIVE Final    Comment: (NOTE) SARS-CoV-2 target nucleic acids are NOT DETECTED.  The SARS-CoV-2 RNA is generally detectable in upper and lower respiratory specimens during the acute phase of infection. The lowest concentration of SARS-CoV-2 viral copies this assay can detect is 250 copies / mL. A negative result does not preclude SARS-CoV-2 infection and should not be used as the sole basis for treatment or other patient management decisions.  A negative result may occur with improper specimen collection / handling, submission of specimen other than nasopharyngeal swab, presence of viral mutation(s) within the areas targeted by this assay, and inadequate number of viral copies (<250 copies / mL). A negative result must be combined with clinical observations, patient history, and epidemiological information.  Fact Sheet for Patients:   RoadLapTop.co.za  Fact Sheet for Healthcare Providers: http://kim-miller.com/  This test is not yet approved or  cleared by the [place] FDA and has been authorized for detection and/or diagnosis of SARS-CoV-2 by FDA  under an Emergency Use Authorization (EUA).  This EUA will remain in effect (meaning this test can be used) for the duration of the COVID-19 declaration under Section 564(b)(1) of the Act, 21 U.S.C. section 360bbb-3(b)(1), unless the authorization is terminated or revoked sooner.  Performed at [facility] [place], [address] [place], [place] [address]   Surgical PCR screen     Status: Abnormal   Collection Time: 08/30/23 10:30 PM   Specimen: Nasal Mucosa; Nasal Swab  Result Value Ref Range Status   MRSA, PCR POSITIVE (A) NEGATIVE Final    Comment: RESULT CALLED TO, READ BACK BY AND VERIFIED WITH: Karolee Ohs [id number], VIRAY,J    Staphylococcus aureus POSITIVE (A) NEGATIVE Final    Comment: Karolee Ohs [id number], VIRAY,J (NOTE) The Xpert SA Assay (FDA approved for NASAL specimens in patients 57 years of age and older), is one component of a comprehensive surveillance program. It is not intended to diagnose infection nor to guide or monitor treatment. Performed at [facility], [address], [place], [place] [address]      Scheduled Meds:  Chlorhexidine Gluconate Cloth  6 each Topical Daily   metoprolol tartrate  12.5 mg Oral BID   mometasone-formoterol  2 puff Inhalation BID   mupirocin ointment  1 Application Nasal BID   Continuous Infusions:  cefTRIAXone (ROCEPHIN)  IV 2 g (08/31/23 2201)   doxycycline (VIBRAMYCIN) IV 100 mg (09/01/23 1005)    Procedures/Studies: DG HIP UNILAT WITH PELVIS 2-3 VIEWS LEFT  Result Date: 08/31/2023 CLINICAL DATA:  Hip replacement for treatment of femoral neck fracture EXAM: DG HIP (WITH OR WITHOUT PELVIS) 2-3V LEFT COMPARISON:  08/30/2023 FINDINGS: Bipolar hip replacement on the left. Components appear well positioned. No radiographically detectable complication. IMPRESSION: Good appearance following bipolar hip replacement on the left. Electronically Signed   By: Paulina Fusi M.D.   On: 08/31/2023 15:36   CT HIP LEFT WO  CONTRAST  Result Date: 08/30/2023 CLINICAL DATA:  Fracture of the left hip. EXAM: CT OF THE LEFT HIP WITHOUT CONTRAST TECHNIQUE: Multidetector CT imaging of the left hip was performed according to the standard protocol. Multiplanar CT image reconstructions were also generated. RADIATION DOSE REDUCTION: This exam was performed according to the departmental dose-optimization program which includes automated exposure control, adjustment of the mA and/or kV according to patient size and/or use of iterative reconstruction technique. COMPARISON:  Left hip radiograph dated  08/30/2023. FINDINGS: Bones/Joint/Cartilage There is a displaced fracture of the left femoral neck. There is proximal migration of the femoral shaft with impaction on the lateral femoral head. No dislocation. The bones are osteopenic. Age indeterminate fracture of the right pubic bone adjacent to the pubic symphysis. Ligaments Suboptimally assessed by CT. Muscles and Tendons No intramuscular fluid collection or hematoma. Soft tissues No acute findings.  Aortoiliac atherosclerotic disease. IMPRESSION: 1. Displaced fracture of the left femoral neck. 2. Age indeterminate fracture of the right pubic bone adjacent to the pubic symphysis. 3.  Aortic Atherosclerosis (ICD10-I70.0). Electronically Signed   By: Elgie Collard M.D.   On: 08/30/2023 22:23   DG Chest 1 View  Result Date: 08/30/2023 CLINICAL DATA:  Fall. EXAM: CHEST  1 VIEW COMPARISON:  July 21, 2023. FINDINGS: The heart size and mediastinal contours are within normal limits. Left lung is clear. Mild right perihilar atelectasis or infiltrate is noted. Small right pleural effusion is noted. The visualized skeletal structures are unremarkable. IMPRESSION: Mild right perihilar atelectasis or infiltrate is noted. Small right pleural effusion. Electronically Signed   By: Lupita Raider M.D.   On: 08/30/2023 18:45   DG Hip Unilat With Pelvis 2-3 Views Left  Result Date:  08/30/2023 CLINICAL DATA:  Left hip pain after fall. EXAM: DG HIP (WITH OR WITHOUT PELVIS) 2-3V LEFT COMPARISON:  None Available. FINDINGS: Moderately displaced proximal left femoral neck fracture is noted. IMPRESSION: Moderately displaced proximal left femoral neck fracture. Electronically Signed   By: Lupita Raider M.D.   On: 08/30/2023 18:43    Catarina Hartshorn, DO  [facility]  If 7PM-7AM, please contact night-coverage [email or web address] Password [email or web address] 09/01/2023, 12:28 PM   LOS: 2 days

## 2023-09-01 NOTE — Anesthesia Postprocedure Evaluation (Signed)
Anesthesia Post Note  Patient: Kathleen Pope  Procedure(s) Performed: ARTHROPLASTY BIPOLAR HIP (HEMIARTHROPLASTY) (Left: Hip)  Patient location during evaluation: Phase II Anesthesia Type: General Level of consciousness: awake Pain management: pain level controlled Vital Signs Assessment: post-procedure vital signs reviewed and stable Respiratory status: spontaneous breathing and respiratory function stable Cardiovascular status: blood pressure returned to baseline and stable Postop Assessment: no headache and no apparent nausea or vomiting Anesthetic complications: no Comments: Late entry   No notable events documented.   Last Vitals:  Vitals:   09/01/23 0519 09/01/23 0722  BP: 131/79   Pulse: 83   Resp: 18   Temp: 37.2 C   SpO2: 95% 96%    Last Pain:  Vitals:   08/31/23 2252  TempSrc:   PainSc: Asleep                 Windell Norfolk

## 2023-09-01 NOTE — Evaluation (Signed)
Occupational Therapy Evaluation Patient Details Name: Kathleen Pope MRN: [id number] DOB: [birth year] Today's Date: 09/01/2023   History of Present Illness Kathleen Pope is a 89 y.o. female with medical history significant for COPD, hypertension, cancer, ileostomy status.  Patient was brought to the ED via EMS reports of a fall and subsequent left hip pain.  Patient reports she was putting away groceries, when her legs tangled up resulting in a fall.  She tried to stand and fell again.  She reports chronic and unchanged difficulty breathing, and cough.  Not on home O2.  No chest pain,, no difficulty breathing, no vomiting no loose stools, no urinary symptoms.  Onset of vomiting in the ED.  Nonbloody. Status post Left hip hemiarthroplasty. (per MD)   Clinical Impression   Pt agreeable to OT and PT co-evaluation. Pt is independent at baseline with PRN support at home. Today pt required min to mod A for bed mobility, mod A for transfers with RW, and mod to max for lower body ADL's per clinical judgement. Pt was removed from supplemental O2 at the start of the session but desaturated to ~84%. Pt was then placed on 2 L but still struggled to consistently stay above 90% SpO2. Pt finally left on 3 LPM and was above 90% SpO2. Pt left in the chair. Pt will benefit from continued OT in the hospital and recommended venue below to increase strength, balance, and endurance for safe ADL's.          If plan is discharge home, recommend the following: A lot of help with walking and/or transfers;A lot of help with bathing/dressing/bathroom;Assistance with cooking/housework;Assist for transportation;Help with stairs or ramp for entrance    Functional Status Assessment  Patient has had a recent decline in their functional status and demonstrates the ability to make significant improvements in function in a reasonable and predictable amount of time.  Equipment Recommendations  None recommended by OT            Precautions / Restrictions Precautions Precautions: Fall Precaution Comments: posterior hip Required Braces or Orthoses: Other Brace Other Brace: abduction pillow Restrictions Weight Bearing Restrictions: Yes LLE Weight Bearing: Weight bearing as tolerated      Mobility Bed Mobility Overal bed mobility: Needs Assistance Bed Mobility: Supine to Sit     Supine to sit: Min assist, Mod assist     General bed mobility comments: slow labored movement; assist to move L LE.    Transfers Overall transfer level: Needs assistance Equipment used: Rolling walker (2 wheels) Transfers: Sit to/from Stand, Bed to chair/wheelchair/BSC Sit to Stand: Mod assist     Step pivot transfers: Mod assist     General transfer comment: Unsteady and labored movement      Balance Overall balance assessment: Needs assistance Sitting-balance support: No upper extremity supported, Feet supported Sitting balance-Leahy Scale: Fair Sitting balance - Comments: fair to good seated at EOB   Standing balance support: Bilateral upper extremity supported, During functional activity, Reliant on assistive device for balance Standing balance-Leahy Scale: Poor Standing balance comment: using RW                           ADL either performed or assessed with clinical judgement   ADL Overall ADL's : Needs assistance/impaired Eating/Feeding: Sitting;Modified independent   Grooming: Sitting;Set up   Upper Body Bathing: Set up;Sitting   Lower Body Bathing: Maximal assistance;Moderate assistance;Sitting/lateral leans   Upper Body Dressing : Set  up;Sitting   Lower Body Dressing: Maximal assistance;Sitting/lateral leans Lower Body Dressing Details (indicate cue type and reason): Due to posterior hip precautions. Toilet Transfer: Moderate assistance;Rolling walker (2 wheels);Stand-pivot Statistician Details (indicate cue type and reason): simualted via EOB to chair transfer. Toileting-  Clothing Manipulation and Hygiene: Moderate assistance;Sitting/lateral lean       Functional mobility during ADLs: Moderate assistance;Rolling walker (2 wheels) General ADL Comments: Able to ambulate ~5 to 7 feet forward and backward in the room.     Vision Baseline Vision/History: 1 Wears glasses Ability to See in Adequate Light: 1 Impaired Patient Visual Report: No change from baseline Vision Assessment?: No apparent visual deficits     Perception Perception: Not tested       Praxis Praxis: Not tested       Pertinent Vitals/Pain Pain Assessment Pain Assessment: 0-10 Pain Score: 5  Pain Location: L hip with movement Pain Descriptors / Indicators: Constant Pain Intervention(s): Limited activity within patient's tolerance, Monitored during session, Repositioned     Extremity/Trunk Assessment Upper Extremity Assessment Upper Extremity Assessment: Generalized weakness;Right hand dominant;LUE deficits/detail LUE Deficits / Details: L UE noted to be 3+/5 at the shoulder due to baseline issues.   Lower Extremity Assessment Lower Extremity Assessment: Defer to PT evaluation   Cervical / Trunk Assessment Cervical / Trunk Assessment: Normal   Communication Communication Communication: No apparent difficulties   Cognition Arousal: Alert Behavior During Therapy: WFL for tasks assessed/performed Overall Cognitive Status: Within Functional Limits for tasks assessed                                                        Home Living Family/patient expects to be discharged to:: Private residence Living Arrangements: Alone Available Help at Discharge: Family;Available PRN/intermittently Type of Home: House Home Access: Stairs to enter [employer] of Steps: 1 Entrance Stairs-Rails: None Home Layout: One level;Other (Comment) (1 step from kitchen to [employer])     [employer] Shower/Tub: Producer, television/film/video: Standard Bathroom  Accessibility: Yes How Accessible: Accessible via walker Home Equipment: Agricultural consultant (2 wheels);Grab bars - tub/shower;Other (comment);Cane - single point;BSC/3in1 (shower stool)          Prior Functioning/Environment Prior Level of Function : Independent/Modified Independent             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 ADLs Comments: Independent ; drives        OT Problem List: Decreased strength;Decreased range of motion;Decreased activity tolerance;Impaired balance (sitting and/or standing)      OT Treatment/Interventions: Self-care/ADL training;Therapeutic exercise;DME and/or AE instruction;Therapeutic activities;Patient/family education    OT Goals(Current goals can be found in the care plan section) Acute Rehab OT Goals Patient Stated Goal: Improve function at rehab prior to return home. OT Goal Formulation: With patient Time For Goal Achievement: 09/15/23 Potential to Achieve Goals: Good  OT Frequency: Min 2X/week    Co-evaluation PT/OT/SLP Co-Evaluation/Treatment: Yes Reason for Co-Treatment: To address functional/ADL transfers   OT goals addressed during session: ADL's and self-care                       End of Session Equipment Utilized During Treatment: Rolling walker (2 wheels);Oxygen Nurse Communication: Mobility status  Activity Tolerance: Patient tolerated treatment well Patient left: in  chair;with call bell/phone within reach  OT Visit Diagnosis: Unsteadiness on feet (R26.81);Other abnormalities of gait and mobility (R26.89);Muscle weakness (generalized) (M62.81);History of falling (Z91.81)                Time: 1610-9604 OT Time Calculation (min): 22 min Charges:  OT General Charges $OT Visit: 1 Visit OT Evaluation $OT Eval Low Complexity: 1 Low  Keyler Hoge OT, MOT  Danie Chandler 09/01/2023, 9:22 AM

## 2023-09-01 NOTE — Plan of Care (Signed)
  Problem: Acute Rehab PT Goals(only PT should resolve) Goal: Pt Will Go Supine/Side To Sit Outcome: Progressing Flowsheets (Taken 09/01/2023 1154) Pt will go Supine/Side to Sit:  with minimal assist  with moderate assist Goal: Patient Will Transfer Sit To/From Stand Outcome: Progressing Flowsheets (Taken 09/01/2023 1154) Patient will transfer sit to/from stand: with minimal assist Goal: Pt Will Transfer Bed To Chair/Chair To Bed Outcome: Progressing Flowsheets (Taken 09/01/2023 1154) Pt will Transfer Bed to Chair/Chair to Bed:  with min assist  with mod assist Goal: Pt Will Ambulate Outcome: Progressing Flowsheets (Taken 09/01/2023 1154) Pt will Ambulate:  25 feet  with minimal assist  with rolling walker   11:55 AM, 09/01/23 Ocie Bob, MPT Physical Therapist with [facility] [phone] office [phone] mobile phone

## 2023-09-01 NOTE — Progress Notes (Signed)
Nurse at bedside,patient out of bed to chair today.Call bell in reach,chair alarm set. Plan of  care ongoing. Family at bedside.

## 2023-09-01 NOTE — Plan of Care (Signed)
  Problem: Acute Rehab OT Goals (only OT should resolve) Goal: Pt. Will Perform Grooming Flowsheets (Taken 09/01/2023 0925) Pt Will Perform Grooming:  with contact guard assist  standing Goal: Pt. Will Perform Lower Body Bathing Flowsheets (Taken 09/01/2023 0925) Pt Will Perform Lower Body Bathing:  with min assist  sitting/lateral leans  with adaptive equipment Goal: Pt. Will Perform Lower Body Dressing Flowsheets (Taken 09/01/2023 0925) Pt Will Perform Lower Body Dressing:  with min assist  sitting/lateral leans  with adaptive equipment Goal: Pt. Will Transfer To Toilet Flowsheets (Taken 09/01/2023 [phone]) Pt Will Transfer to Toilet:  with supervision  stand pivot transfer Goal: Pt. Will Perform Toileting-Clothing Manipulation Flowsheets (Taken 09/01/2023 0925) Pt Will Perform Toileting - Clothing Manipulation and hygiene:  with modified independence  sitting/lateral leans Goal: Pt/Caregiver Will Perform Home Exercise Program Flowsheets (Taken 09/01/2023 [phone]) Pt/caregiver will Perform Home Exercise Program:  Increased strength  Both right and left upper extremity  Independently  Siddalee Vanderheiden OT, MOT

## 2023-09-01 NOTE — Evaluation (Signed)
Physical Therapy Evaluation Patient Details Name: Kathleen Pope MRN: [id number] DOB: [birth year] Today's Date: 09/01/2023  History of Present Illness  Kathleen Pope is a 89 y.o. female with medical history significant for COPD, hypertension, cancer, ileostomy status.  Patient was brought to the ED via EMS reports of a fall and subsequent left hip pain.  Patient reports she was putting away groceries, when her legs tangled up resulting in a fall.  She tried to stand and fell again.  She reports chronic and unchanged difficulty breathing, and cough.  Not on home O2.  No chest pain,, no difficulty breathing, no vomiting no loose stools, no urinary symptoms.  Onset of vomiting in the ED.  Nonbloody. Status post Left hip hemiarthroplasty on 08/31/23.   Clinical Impression  Patient demonstrates slow labored movement for sitting up at bedside with c/o increased left hip pain, very unsteady on feet and limited to a few steps at bedside before having to sit due to fatigue, increasing left hip pain and generalized weakness.  Patient tolerated sitting up in chair after therapy.  Patient will benefit from continued skilled physical therapy in hospital and recommended venue below to increase strength, balance, endurance for safe ADLs and gait.           If plan is discharge home, recommend the following: A lot of help with walking and/or transfers;A lot of help with bathing/dressing/bathroom;Help with stairs or ramp for entrance;Assistance with cooking/housework   Can travel by private vehicle   No    Equipment Recommendations None recommended by PT  Recommendations for Other Services       Functional Status Assessment Patient has had a recent decline in their functional status and demonstrates the ability to make significant improvements in function in a reasonable and predictable amount of time.     Precautions / Restrictions Precautions Precautions: Fall Precaution Comments: posterior hip Required  Braces or Orthoses: Other Brace Other Brace: abduction pillow Restrictions Weight Bearing Restrictions: Yes LLE Weight Bearing: Weight bearing as tolerated      Mobility  Bed Mobility Overal bed mobility: Needs Assistance Bed Mobility: Supine to Sit     Supine to sit: Min assist, Mod assist     General bed mobility comments: slow labored movement; assist to move L LE.    Transfers Overall transfer level: Needs assistance Equipment used: Rolling walker (2 wheels) Transfers: Sit to/from Stand, Bed to chair/wheelchair/BSC Sit to Stand: Mod assist   Step pivot transfers: Mod assist       General transfer comment: Unsteady and labored movement    Ambulation/Gait Ambulation/Gait assistance: Mod assist Gait Distance (Feet): 8 Feet Assistive device: Rolling walker (2 wheels) Gait Pattern/deviations: Decreased step length - right, Decreased step length - left, Decreased stride length, Antalgic, Decreased stance time - left Gait velocity: slow     General Gait Details: limited to a few slow labored side seps, steps forward/backwad at bedside due to increased left hip pain and fatigue  Stairs            Wheelchair Mobility     Tilt Bed    Modified Rankin (Stroke Patients Only)       Balance Overall balance assessment: Needs assistance Sitting-balance support: Feet supported, No upper extremity supported Sitting balance-Leahy Scale: Fair Sitting balance - Comments: fair/good seated at EOB   Standing balance support: Bilateral upper extremity supported, During functional activity, Reliant on assistive device for balance Standing balance-Leahy Scale: Poor Standing balance comment: using RW  Pertinent Vitals/Pain Pain Assessment Pain Assessment: 0-10 Pain Score: 5  Pain Location: L hip with movement Pain Descriptors / Indicators: Constant Pain Intervention(s): Limited activity within patient's tolerance, Monitored  during session, Premedicated before session, Repositioned    Home Living Family/patient expects to be discharged to:: Private residence Living Arrangements: Alone Available Help at Discharge: Family;Available PRN/intermittently Type of Home: House Home Access: Stairs to enter Entrance Stairs-Rails: None Entrance Stairs-Number of Steps: 1   Home Layout: One level Home Equipment: Agricultural consultant (2 wheels);Grab bars - tub/shower;Other (comment);Cane - single point;BSC/3in1      Prior Function Prior Level of Function : Independent/Modified Independent             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 ADLs Comments: Independent ; drives     Extremity/Trunk Assessment   Upper Extremity Assessment Upper Extremity Assessment: Defer to OT evaluation LUE Deficits / Details: L UE noted to be 3+/5 at the shoulder due to baseline issues.    Lower Extremity Assessment Lower Extremity Assessment: Generalized weakness;LLE deficits/detail LLE Deficits / Details: grossly 3+/5 LLE: Unable to fully assess due to pain LLE Sensation: WNL LLE Coordination: WNL    Cervical / Trunk Assessment Cervical / Trunk Assessment: Normal  Communication   Communication Communication: No apparent difficulties  Cognition Arousal: Alert Behavior During Therapy: WFL for tasks assessed/performed Overall Cognitive Status: Within Functional Limits for tasks assessed                                          General Comments      Exercises     Assessment/Plan    PT Assessment Patient needs continued PT services  PT Problem List Decreased strength;Decreased activity tolerance;Decreased balance;Decreased mobility;Pain       PT Treatment Interventions DME instruction;Gait training;Stair training;Functional mobility training;Therapeutic activities;Therapeutic exercise;Balance training;Patient/family education    PT Goals (Current  goals can be found in the Care Plan section)  Acute Rehab PT Goals Patient Stated Goal: return home after rehab PT Goal Formulation: With patient Time For Goal Achievement: 09/15/23 Potential to Achieve Goals: Good    Frequency Min 4X/week     Co-evaluation PT/OT/SLP Co-Evaluation/Treatment: Yes Reason for Co-Treatment: To address functional/ADL transfers PT goals addressed during session: Mobility/safety with mobility;Balance;Proper use of DME OT goals addressed during session: ADL's and self-care       AM-PAC PT "6 Clicks" Mobility  Outcome Measure Help needed turning from your back to your side while in a flat bed without using bedrails?: A Lot Help needed moving from lying on your back to sitting on the side of a flat bed without using bedrails?: A Lot Help needed moving to and from a bed to a chair (including a wheelchair)?: A Lot Help needed standing up from a chair using your arms (e.g., wheelchair or bedside chair)?: A Lot Help needed to walk in hospital room?: A Lot Help needed climbing 3-5 steps with a railing? : Total 6 Click Score: 11    End of Session   Activity Tolerance: Patient tolerated treatment well;Patient limited by fatigue Patient left: in chair;with call bell/phone within reach Nurse Communication: Mobility status PT Visit Diagnosis: Unsteadiness on feet (R26.81);Other abnormalities of gait and mobility (R26.89);Muscle weakness (generalized) (M62.81);History of falling (Z91.81)    Time: 8756-4332 PT Time Calculation (min) (ACUTE ONLY): 29 min   Charges:   PT  Evaluation $PT Eval Moderate Complexity: 1 Mod PT Treatments $Therapeutic Activity: 23-37 mins PT General Charges $$ ACUTE PT VISIT: 1 Visit         11:53 AM, 09/01/23 Ocie Bob, MPT Physical Therapist with [facility] [phone] office [phone] mobile phone

## 2023-09-01 NOTE — TOC Initial Note (Signed)
Transition of Care [facility]) - Initial/Assessment Note    Patient Details  Name: Kathleen Pope MRN: [id number] Date of Birth: 02-12-1933  Transition of Care [facility]) CM/SW Contact:    Elliot Gault, LCSW Phone Number: 09/01/2023, 11:17 AM  Clinical Narrative:                  Pt admitted from home. PT/OT recommending SNF rehab at dc. Spoke with pt to review dc planning. Pt agreeable to SNF and requesting [facility] as pt's brother is there.   MD anticipating dc tomorrow. Referral sent. Insurance Berkley Harvey will be started.   TOC will follow.  Expected Discharge Plan: Skilled Nursing Facility Barriers to Discharge: Continued Medical Work up   Patient Goals and CMS Choice Patient states their goals for this hospitalization and ongoing recovery are:: rehab CMS Medicare.gov Compare Post Acute Care list provided to:: Patient Choice offered to / list presented to : Patient [place] ownership interest in [facility]provided to:: Patient    Expected Discharge Plan and Services In-house Referral: Clinical Social Work   Post Acute Care Choice: Skilled Nursing Facility Living arrangements for the past 2 months: Single Family Home                                      Prior Living Arrangements/Services Living arrangements for the past 2 months: Single Family Home Lives with:: Self Patient language and need for interpreter reviewed:: Yes Do you feel safe going back to the place where you live?: Yes      Need for Family Participation in Patient Care: No (Comment)     Criminal Activity/Legal Involvement Pertinent to Current Situation/Hospitalization: No - Comment as needed  Activities of Daily Living   ADL Screening (condition at time of admission) Independently performs ADLs?: Yes (appropriate for developmental age) Is the patient deaf or have difficulty hearing?: No Does the patient have difficulty seeing, even when wearing glasses/contacts?: No Does the patient have  difficulty concentrating, remembering, or making decisions?: No  Permission Sought/Granted Permission sought to share information with : Facility Industrial/product designer granted to share information with : Yes, Verbal Permission Granted     Permission granted to share info w AGENCY: [facility]        Emotional Assessment Appearance:: Appears stated age Attitude/Demeanor/Rapport: Engaged Affect (typically observed): Pleasant Orientation: : Oriented to Self, Oriented to Place, Oriented to  Time, Oriented to Situation Alcohol / Substance Use: Not Applicable Psych Involvement: No (comment)  Admission diagnosis:  Closed left hip fracture (HCC) [S72.002A] Closed fracture of left hip, initial encounter (HCC) [S72.002A] Patient Active Problem List   Diagnosis Date Noted   Closed left hip fracture (HCC) 08/30/2023   CAP (community acquired pneumonia) 08/30/2023   COPD (chronic obstructive pulmonary disease) (HCC) 08/30/2023   Prolonged QT interval 08/30/2023   Severe sepsis (HCC) 08/30/2023   Hyponatremia 07/19/2023   Pressure injury of skin 07/19/2023   SBO (small bowel obstruction) (HCC) 07/18/2023   Lung mass 02/05/2023   Bronchogenic cancer of right lung (HCC) 01/16/2023   Cavitating mass in right upper lung lobe 01/02/2023   Acute hypoxic respiratory failure (HCC) 05/12/2022   Hypokalemia 01/27/2021   Ileostomy in place (HCC) 01/24/2021   High output ileostomy (HCC) 01/24/2021   Protein-calorie malnutrition, severe 01/21/2021   Ischemic colitis with LGIB s/p colectomies & ileostomy 3/3 & 01/17/2021 01/19/2021   Fluid overload 01/19/2021  Hemorrhagic shock (HCC) 01/19/2021   Benign neoplasm of cecum    Benign neoplasm of ascending colon    Abnormal CT of the abdomen    Squamous cell carcinoma of skin of right middle finger 12/11/2020   Pleural effusion on right 11/27/2020   Loose stools 04/06/2017   De Quervain's disease (tenosynovitis) 03/14/2014   Hematochezia  03/09/2013   External hemorrhoids 03/08/2013   GI hemorrhage at cecal ulcer s/p colectomy/ileostomy 01/17/2021 08/18/2012   AVM (arteriovenous malformation) of colon 08/18/2012   HTN (hypertension) 08/18/2012   Acute blood loss anemia 08/18/2012   Bruit 08/04/2012   Mixed hyperlipidemia 08/04/2012   Cigarette smoker 08/04/2012   Murmur 08/04/2012   PCP:  Elfredia Nevins, MD Pharmacy:   [facility] [place] [place] - [address] [place] [address] Phone: [phone] Fax: [phone]     Social Determinants of Health (SDOH) Social History: SDOH Screenings   Food Insecurity: No Food Insecurity (08/30/2023)  Housing: Low Risk  (08/30/2023)  Transportation Needs: No Transportation Needs (08/30/2023)  Utilities: Not At Risk (08/30/2023)  Depression (PHQ2-9): Low Risk  (01/03/2023)  Tobacco Use: High Risk (08/31/2023)   SDOH Interventions:     Readmission Risk Interventions    07/19/2023    3:46 PM 01/25/2021   12:06 PM  Readmission Risk Prevention Plan  Post Dischage Appt Complete   Medication Screening Complete   Transportation Screening Complete Complete  HRI or Home Care Consult  Complete  Social Work Consult for Recovery Care Planning/Counseling  Complete  Palliative Care Screening  Not Applicable  Medication Review Oceanographer)  Complete

## 2023-09-02 ENCOUNTER — Encounter (HOSPITAL_COMMUNITY): Payer: Self-pay | Admitting: Orthopedic Surgery

## 2023-09-02 DIAGNOSIS — Z932 Ileostomy status: Secondary | ICD-10-CM | POA: Diagnosis not present

## 2023-09-02 DIAGNOSIS — S72002D Fracture of unspecified part of neck of left femur, subsequent encounter for closed fracture with routine healing: Secondary | ICD-10-CM | POA: Diagnosis not present

## 2023-09-02 DIAGNOSIS — J9601 Acute respiratory failure with hypoxia: Secondary | ICD-10-CM | POA: Diagnosis not present

## 2023-09-02 LAB — BASIC METABOLIC PANEL
Anion gap: 8 (ref 5–15)
BUN: 15 mg/dL (ref 8–23)
CO2: 24 mmol/L (ref 22–32)
Calcium: 7.9 mg/dL — ABNORMAL LOW (ref 8.9–10.3)
Chloride: 97 mmol/L — ABNORMAL LOW (ref 98–111)
Creatinine, Ser: 0.62 mg/dL (ref 0.44–1.00)
GFR, Estimated: >60 mL/min (ref >60)
Glucose, Bld: 114 mg/dL — ABNORMAL HIGH (ref 70–99)
Potassium: 3.6 mmol/L (ref 3.5–5.1)
Sodium: 129 mmol/L — ABNORMAL LOW (ref 135–145)

## 2023-09-02 LAB — CBC
HCT: 29.8 % — ABNORMAL LOW (ref 36.0–46.0)
Hemoglobin: 9.9 g/dL — ABNORMAL LOW (ref 12.0–15.0)
MCH: 32.5 pg (ref 26.0–34.0)
MCHC: 33.2 g/dL (ref 30.0–36.0)
MCV: 97.7 fL (ref 80.0–100.0)
Platelets: 243 10*3/uL (ref 150–400)
RBC: 3.05 MIL/uL — ABNORMAL LOW (ref 3.87–5.11)
RDW: 14 % (ref 11.5–15.5)
WBC: 11.6 10*3/uL — ABNORMAL HIGH (ref 4.0–10.5)
nRBC: 0 % (ref 0.0–0.2)

## 2023-09-02 MED ORDER — DOXYCYCLINE HYCLATE 100 MG PO TABS
100.0000 mg | ORAL_TABLET | Freq: Two times a day (BID) | ORAL | Status: DC
  Administered 2023-09-02 – 2023-09-03 (×2): 100 mg via ORAL
  Filled 2023-09-02 (×2): qty 1

## 2023-09-02 MED ORDER — POTASSIUM CHLORIDE IN NACL 20-0.9 MEQ/L-% IV SOLN: INTRAVENOUS | Status: AC

## 2023-09-02 MED ORDER — CEFDINIR 300 MG PO CAPS
300.0000 mg | ORAL_CAPSULE | Freq: Two times a day (BID) | ORAL | Status: DC
  Administered 2023-09-02 – 2023-09-03 (×2): 300 mg via ORAL
  Filled 2023-09-02 (×2): qty 1

## 2023-09-02 NOTE — Plan of Care (Signed)
CHL Tonsillectomy/Adenoidectomy, Postoperative PEDS care plan entered in error.

## 2023-09-02 NOTE — TOC Progression Note (Signed)
Transition of Care [facility]) - Progression Note    Patient Details  Name: Kathleen Pope MRN: [id number] Date of Birth: 1933/02/26  Transition of Care [facility]) CM/SW Contact  Elliot Gault, LCSW Phone Number: 09/02/2023, 2:34 PM  Clinical Narrative:     TOC following. Pt has auth for SNF at [facility]. Per MD, anticipating dc tomorrow. Updated Kerri at [facility].  Will follow up in AM.  Expected Discharge Plan: Skilled Nursing Facility Barriers to Discharge: Continued Medical Work up  Expected Discharge Plan and Services In-house Referral: Clinical Social Work   Post Acute Care Choice: Skilled Nursing Facility Living arrangements for the past 2 months: Single Family Home                                       Social Determinants of Health (SDOH) Interventions SDOH Screenings   Food Insecurity: No Food Insecurity (08/30/2023)  Housing: Low Risk  (08/30/2023)  Transportation Needs: No Transportation Needs (08/30/2023)  Utilities: Not At Risk (08/30/2023)  Depression (PHQ2-9): Low Risk  (01/03/2023)  Tobacco Use: High Risk (08/31/2023)    Readmission Risk Interventions    07/19/2023    3:46 PM 01/25/2021   12:06 PM  Readmission Risk Prevention Plan  Post Dischage Appt Complete   Medication Screening Complete   Transportation Screening Complete Complete  HRI or Home Care Consult  Complete  Social Work Consult for Recovery Care Planning/Counseling  Complete  Palliative Care Screening  Not Applicable  Medication Review Oceanographer)  Complete

## 2023-09-02 NOTE — Care Management Important Message (Signed)
Important Message  Patient Details  Name: Kathleen Pope MRN: [id number] Date of Birth: 1933/03/07   Important Message Given:  Yes - Medicare IM     Corey Harold 09/02/2023, 11:26 AM

## 2023-09-02 NOTE — Progress Notes (Signed)
PROGRESS NOTE  Kathleen Pope:[id number] DOB: [birth year] DOA: 08/30/2023 PCP: Elfredia Nevins, MD  Brief History:  89 year old female with a history of right-sided lung cancer s/p XRT (03/31/23-04/12/23), tobacco abuse, COPD, GI bleed status post total colectomy end ileostomy, hypertension presenting with a mechanical fall resulting in subsequent left hip pain.  The patient was putting away her groceries when she felt like her legs got tangled up resulting in a mechanical fall.  She had denied any loss of consciousness.  The patient tried to stand again, but unfortunately sustained another fall. The patient denies any fevers, chills, chest pain, shortness breath, nausea, vomiting or diarrhea, abdominal pain, dysuria.  She did have 1 episode of emesis in the ED.  There was no blood.  She does report chronic dyspnea on exertion and coughing. In the ED, the patient was afebrile and hemodynamically stable with oxygen saturation down to 84% on room air.  She was placed on 2 L with saturation up to 94%.  WBC 14.6, hemoglobin 12.4, platelets 295.  Potassium was 3.2.  Serum creatinine 0.75.  COVID-19 PCR is negative.  UA was negative for pyuria.  EKG showed sinus rhythm with nonspecific ST changes.  CT of the left hip showed a displaced left femoral neck fracture.  There was an age-indeterminate right pubic bone fracture.  Chest x-ray showed right perihilar opacity. Orthopedic surgery, Dr. Dallas Schimke was consulted to assist.   Assessment/Plan: Left femoral neck fracture -Orthopedic surgery consult appreciated -08/30/2023 CT left hip as discussed above -08/31/23--left hip hemiarthroplasty -Judicious opioids -PT>>SNF   Severe sepsis -Presented with tachycardia, leukocytosis, and hypoxia -Secondary to pneumonia   Lobar pneumonia -Personally reviewed chest x-ray-- RLL opacity -Check PCT <0.10 -Lactic acid 1.2 -Continue ceftriaxone and azithromycin>>change to po   Right hip pain -10/17--pt  complains right hip pain after getting up in chair -repeat xray pelvis--no new acute abnormalities -discussed with Dr. Dallas Schimke   Acute respiratory failure with hypoxia -Secondary to pneumonia -Wean oxygen as tolerated for saturation greater 92% -Stable on 3L   COPD -Continue Dulera -start duo nebs and brovana   Bronchogenic cancer of the right lung -Status post radiation -Follow-up Dr. Ellin Saba   Hypokalemia -Replete -Magnesium 1.9  Hyponatremia -due to volume depletion and poor solute intake -start IVF x 12 hours   Essential hypertension -Continue home dose metoprolol tartrate                     Family Communication:   Family at bedside updated 10/18   Consultants:  orthopedic surgery   Code Status:   DNR   DVT Prophylaxis:  [place] lovenox     Procedures: As Listed in Progress Note Above   Antibiotics: Ceftriaxone 10/15>> Doxy 10/15>>             Subjective: Pt denies f/c, cp, sob, n/v/d.  Complains of hip pain.    Objective: Vitals:   09/01/23 2042 09/01/23 2048 09/02/23 0450 09/02/23 1413  BP: 133/82  (!) 159/91 (!) 146/84  Pulse: (!) 109  96 83  Resp: 18  18   Temp: 98.4 F (36.9 C)  98.6 F (37 C) 98.5 F (36.9 C)  TempSrc:      SpO2: 90% 91% 92% 93%  Weight:      Height:        Intake/Output Summary (Last 24 hours) at 09/02/2023 1804 Last data filed at 09/02/2023 1230 Gross per 24 hour  Intake  680 ml  Output 800 ml  Net -120 ml   Weight change:  Exam:  General:  Pt is alert, follows commands appropriately, not in acute distress HEENT: No icterus, No thrush, No neck mass, [place]/AT Cardiovascular: RRR, S1/S2, no rubs, no gallops Respiratory: diminished BS.  Bibasilar crackles. No wheeze Abdomen: Soft/+BS, non tender, non distended, no guarding Extremities: No edema, No lymphangitis, No petechiae, No rashes, no synovitis   Data Reviewed: I have personally reviewed following labs and imaging studies Basic Metabolic  Panel: Recent Labs  Lab 08/30/23 1708 08/31/23 0422 09/01/23 0429 09/02/23 0427  NA 134* 134* 134* 129*  K 3.2* 4.8 4.0 3.6  CL 98 101 102 97*  CO2 24 24 22 24   GLUCOSE 131* 142* 116* 114*  BUN 11 13 17 15   CREATININE 0.75 0.65 0.61 0.62  CALCIUM 9.0 8.3* 8.0* 7.9*  MG 1.9  --   --   --    Liver Function Tests: No results for input(s): "AST", "ALT", "ALKPHOS", "BILITOT", "PROT", "ALBUMIN" in the last 168 hours. No results for input(s): "LIPASE", "AMYLASE" in the last 168 hours. No results for input(s): "AMMONIA" in the last 168 hours. Coagulation Profile: Recent Labs  Lab 08/30/23 1708  INR 0.9   CBC: Recent Labs  Lab 08/30/23 1708 08/31/23 0422 09/01/23 0429 09/02/23 0427  WBC 14.6* 11.2* 10.9* 11.6*  NEUTROABS 12.9*  --   --   --   HGB 12.4 11.1* 9.8* 9.9*  HCT 35.5* 32.8* 29.3* 29.8*  MCV 96.2 98.5 99.0 97.7  PLT 321 295 252 243   Cardiac Enzymes: No results for input(s): "CKTOTAL", "CKMB", "CKMBINDEX", "TROPONINI" in the last 168 hours. BNP: Invalid input(s): "POCBNP" CBG: Recent Labs  Lab 08/31/23 0952  GLUCAP 141*   HbA1C: No results for input(s): "HGBA1C" in the last 72 hours. Urine analysis:    Component Value Date/Time   COLORURINE YELLOW 08/30/2023 2220   APPEARANCEUR CLEAR 08/30/2023 2220   LABSPEC 1.014 08/30/2023 2220   PHURINE 6.0 08/30/2023 2220   GLUCOSEU 50 (A) 08/30/2023 2220   HGBUR NEGATIVE 08/30/2023 2220   BILIRUBINUR NEGATIVE 08/30/2023 2220   KETONESUR 5 (A) 08/30/2023 2220   PROTEINUR 30 (A) 08/30/2023 2220   UROBILINOGEN 1.0 03/09/2013 2028   NITRITE NEGATIVE 08/30/2023 2220   LEUKOCYTESUR NEGATIVE 08/30/2023 2220   Sepsis Labs: @LABRCNTIP (procalcitonin:4,lacticidven:4) ) Recent Results (from the past 240 hour(s))  Culture, blood (Routine X 2) w Reflex to ID Panel     Status: None (Preliminary result)   Collection Time: 08/30/23 10:02 PM   Specimen: BLOOD  Result Value Ref Range Status   Specimen Description BLOOD  BLOOD LEFT ARM  Final   Special Requests   Final    BOTTLES DRAWN AEROBIC AND ANAEROBIC Blood Culture adequate volume   Culture   Final    NO GROWTH 3 DAYS Performed at [facility], [address] [place], [place] [address]    Report Status PENDING  Incomplete  Culture, blood (Routine X 2) w Reflex to ID Panel     Status: None (Preliminary result)   Collection Time: 08/30/23 10:04 PM   Specimen: BLOOD  Result Value Ref Range Status   Specimen Description BLOOD BLOOD LEFT ARM  Final   Special Requests   Final    BOTTLES DRAWN AEROBIC AND ANAEROBIC Blood Culture adequate volume   Culture   Final    NO GROWTH 3 DAYS Performed at [facility], [address], [place], [place] [address]  Report Status PENDING  Incomplete  SARS Coronavirus 2 by RT PCR (hospital order, performed in [facility] hospital lab) *cepheid single result test* Anterior Nasal Swab     Status: None   Collection Time: 08/30/23 10:30 PM   Specimen: Anterior Nasal Swab  Result Value Ref Range Status   SARS Coronavirus 2 by RT PCR NEGATIVE NEGATIVE Final    Comment: (NOTE) SARS-CoV-2 target nucleic acids are NOT DETECTED.  The SARS-CoV-2 RNA is generally detectable in upper and lower respiratory specimens during the acute phase of infection. The lowest concentration of SARS-CoV-2 viral copies this assay can detect is 250 copies / mL. A negative result does not preclude SARS-CoV-2 infection and should not be used as the sole basis for treatment or other patient management decisions.  A negative result may occur with improper specimen collection / handling, submission of specimen other than nasopharyngeal swab, presence of viral mutation(s) within the areas targeted by this assay, and inadequate number of viral copies (<250 copies / mL). A negative result must be combined with clinical observations, patient history, and epidemiological information.  Fact Sheet for Patients:    RoadLapTop.co.za  Fact Sheet for Healthcare Providers: http://kim-miller.com/  This test is not yet approved or  cleared by the [place] FDA and has been authorized for detection and/or diagnosis of SARS-CoV-2 by FDA under an Emergency Use Authorization (EUA).  This EUA will remain in effect (meaning this test can be used) for the duration of the COVID-19 declaration under Section 564(b)(1) of the Act, 21 U.S.C. section 360bbb-3(b)(1), unless the authorization is terminated or revoked sooner.  Performed at [facility], [address] [place], [place] [address]   Surgical PCR screen     Status: Abnormal   Collection Time: 08/30/23 10:30 PM   Specimen: Nasal Mucosa; Nasal Swab  Result Value Ref Range Status   MRSA, PCR POSITIVE (A) NEGATIVE Final    Comment: RESULT CALLED TO, READ BACK BY AND VERIFIED WITH: Karolee Ohs [id number], VIRAY,J    Staphylococcus aureus POSITIVE (A) NEGATIVE Final    Comment: Karolee Ohs [id number], VIRAY,J (NOTE) The Xpert SA Assay (FDA approved for NASAL specimens in patients 4 years of age and older), is one component of a comprehensive surveillance program. It is not intended to diagnose infection nor to guide or monitor treatment. Performed at [facility], [address], [place], [place] [address]      Scheduled Meds:  Chlorhexidine Gluconate Cloth  6 each Topical Daily   metoprolol tartrate  12.5 mg Oral BID   mometasone-formoterol  2 puff Inhalation BID   mupirocin ointment  1 Application Nasal BID   Continuous Infusions:  cefTRIAXone (ROCEPHIN)  IV 2 g (09/02/23 0047)   doxycycline (VIBRAMYCIN) IV 100 mg (09/02/23 0910)    Procedures/Studies: DG Pelvis 1-2 Views  Result Date: 09/01/2023 CLINICAL DATA:  Bilateral hip pain.  Surgery on the left yesterday. EXAM: PELVIS - 1-2 VIEW COMPARISON:  08/31/2023 FINDINGS: Postoperative changes with a left hip hemiarthroplasty using non  cemented component. Component appears well seated. No evidence of dislocation or fracture. Skin clips and soft tissue gas are consistent with recent surgery. Mild degenerative changes in the right hip. Moderate degenerative changes in the lower lumbar spine. Pelvis appears intact. SI joints and symphysis pubis are not displaced. Vascular calcifications. IMPRESSION: Left hip hemiarthroplasty without significant change since prior study. No acute bony abnormalities. Electronically Signed   By: Burman Nieves M.D.   On: 09/01/2023 19:24  DG HIP UNILAT WITH PELVIS 2-3 VIEWS LEFT  Result Date: 08/31/2023 CLINICAL DATA:  Hip replacement for treatment of femoral neck fracture EXAM: DG HIP (WITH OR WITHOUT PELVIS) 2-3V LEFT COMPARISON:  08/30/2023 FINDINGS: Bipolar hip replacement on the left. Components appear well positioned. No radiographically detectable complication. IMPRESSION: Good appearance following bipolar hip replacement on the left. Electronically Signed   By: Paulina Fusi M.D.   On: 08/31/2023 15:36   CT HIP LEFT WO CONTRAST  Result Date: 08/30/2023 CLINICAL DATA:  Fracture of the left hip. EXAM: CT OF THE LEFT HIP WITHOUT CONTRAST TECHNIQUE: Multidetector CT imaging of the left hip was performed according to the standard protocol. Multiplanar CT image reconstructions were also generated. RADIATION DOSE REDUCTION: This exam was performed according to the departmental dose-optimization program which includes automated exposure control, adjustment of the mA and/or kV according to patient size and/or use of iterative reconstruction technique. COMPARISON:  Left hip radiograph dated 08/30/2023. FINDINGS: Bones/Joint/Cartilage There is a displaced fracture of the left femoral neck. There is proximal migration of the femoral shaft with impaction on the lateral femoral head. No dislocation. The bones are osteopenic. Age indeterminate fracture of the right pubic bone adjacent to the pubic symphysis.  Ligaments Suboptimally assessed by CT. Muscles and Tendons No intramuscular fluid collection or hematoma. Soft tissues No acute findings.  Aortoiliac atherosclerotic disease. IMPRESSION: 1. Displaced fracture of the left femoral neck. 2. Age indeterminate fracture of the right pubic bone adjacent to the pubic symphysis. 3.  Aortic Atherosclerosis (ICD10-I70.0). Electronically Signed   By: Elgie Collard M.D.   On: 08/30/2023 22:23   DG Chest 1 View  Result Date: 08/30/2023 CLINICAL DATA:  Fall. EXAM: CHEST  1 VIEW COMPARISON:  July 21, 2023. FINDINGS: The heart size and mediastinal contours are within normal limits. Left lung is clear. Mild right perihilar atelectasis or infiltrate is noted. Small right pleural effusion is noted. The visualized skeletal structures are unremarkable. IMPRESSION: Mild right perihilar atelectasis or infiltrate is noted. Small right pleural effusion. Electronically Signed   By: Lupita Raider M.D.   On: 08/30/2023 18:45   DG Hip Unilat With Pelvis 2-3 Views Left  Result Date: 08/30/2023 CLINICAL DATA:  Left hip pain after fall. EXAM: DG HIP (WITH OR WITHOUT PELVIS) 2-3V LEFT COMPARISON:  None Available. FINDINGS: Moderately displaced proximal left femoral neck fracture is noted. IMPRESSION: Moderately displaced proximal left femoral neck fracture. Electronically Signed   By: Lupita Raider M.D.   On: 08/30/2023 18:43    Catarina Hartshorn, DO  [facility]  If 7PM-7AM, please contact night-coverage [email or web address] Password [email or web address] 09/02/2023, 6:04 PM   LOS: 3 days

## 2023-09-03 DIAGNOSIS — C3491 Malignant neoplasm of unspecified part of right bronchus or lung: Secondary | ICD-10-CM | POA: Diagnosis not present

## 2023-09-03 DIAGNOSIS — E876 Hypokalemia: Secondary | ICD-10-CM | POA: Diagnosis not present

## 2023-09-03 DIAGNOSIS — A419 Sepsis, unspecified organism: Secondary | ICD-10-CM | POA: Diagnosis not present

## 2023-09-03 DIAGNOSIS — Z471 Aftercare following joint replacement surgery: Secondary | ICD-10-CM | POA: Diagnosis not present

## 2023-09-03 DIAGNOSIS — Z432 Encounter for attention to ileostomy: Secondary | ICD-10-CM | POA: Diagnosis not present

## 2023-09-03 DIAGNOSIS — K5793 Diverticulitis of intestine, part unspecified, without perforation or abscess with bleeding: Secondary | ICD-10-CM | POA: Diagnosis not present

## 2023-09-03 DIAGNOSIS — S72002G Fracture of unspecified part of neck of left femur, subsequent encounter for closed fracture with delayed healing: Secondary | ICD-10-CM

## 2023-09-03 DIAGNOSIS — I10 Essential (primary) hypertension: Secondary | ICD-10-CM | POA: Diagnosis not present

## 2023-09-03 DIAGNOSIS — E785 Hyperlipidemia, unspecified: Secondary | ICD-10-CM | POA: Diagnosis not present

## 2023-09-03 DIAGNOSIS — R262 Difficulty in walking, not elsewhere classified: Secondary | ICD-10-CM | POA: Diagnosis not present

## 2023-09-03 DIAGNOSIS — M6281 Muscle weakness (generalized): Secondary | ICD-10-CM | POA: Diagnosis not present

## 2023-09-03 DIAGNOSIS — S72002S Fracture of unspecified part of neck of left femur, sequela: Secondary | ICD-10-CM | POA: Diagnosis not present

## 2023-09-03 DIAGNOSIS — S72142D Displaced intertrochanteric fracture of left femur, subsequent encounter for closed fracture with routine healing: Secondary | ICD-10-CM | POA: Diagnosis not present

## 2023-09-03 DIAGNOSIS — R488 Other symbolic dysfunctions: Secondary | ICD-10-CM | POA: Diagnosis not present

## 2023-09-03 DIAGNOSIS — K5790 Diverticulosis of intestine, part unspecified, without perforation or abscess without bleeding: Secondary | ICD-10-CM | POA: Diagnosis not present

## 2023-09-03 DIAGNOSIS — S72002A Fracture of unspecified part of neck of left femur, initial encounter for closed fracture: Secondary | ICD-10-CM | POA: Diagnosis not present

## 2023-09-03 DIAGNOSIS — J189 Pneumonia, unspecified organism: Secondary | ICD-10-CM | POA: Diagnosis not present

## 2023-09-03 DIAGNOSIS — E43 Unspecified severe protein-calorie malnutrition: Secondary | ICD-10-CM | POA: Diagnosis not present

## 2023-09-03 DIAGNOSIS — Z9181 History of falling: Secondary | ICD-10-CM | POA: Diagnosis not present

## 2023-09-03 DIAGNOSIS — S32501S Unspecified fracture of right pubis, sequela: Secondary | ICD-10-CM | POA: Diagnosis not present

## 2023-09-03 DIAGNOSIS — D649 Anemia, unspecified: Secondary | ICD-10-CM | POA: Diagnosis not present

## 2023-09-03 DIAGNOSIS — J9601 Acute respiratory failure with hypoxia: Secondary | ICD-10-CM | POA: Diagnosis not present

## 2023-09-03 DIAGNOSIS — Z932 Ileostomy status: Secondary | ICD-10-CM | POA: Diagnosis not present

## 2023-09-03 DIAGNOSIS — J449 Chronic obstructive pulmonary disease, unspecified: Secondary | ICD-10-CM | POA: Diagnosis not present

## 2023-09-03 LAB — TYPE AND SCREEN
ABO/RH(D): B POS
Antibody Screen: POSITIVE
Unit division: 0
Unit division: 0

## 2023-09-03 LAB — BPAM RBC
Blood Product Expiration Date: 202411122359
Unit Type and Rh: 7300

## 2023-09-03 LAB — MAGNESIUM: Magnesium: 1.9 mg/dL (ref 1.7–2.4)

## 2023-09-03 LAB — BASIC METABOLIC PANEL
Anion gap: 8 (ref 5–15)
BUN: 16 mg/dL (ref 8–23)
CO2: 25 mmol/L (ref 22–32)
Calcium: 8 mg/dL — ABNORMAL LOW (ref 8.9–10.3)
Chloride: 99 mmol/L (ref 98–111)
Creatinine, Ser: 0.67 mg/dL (ref 0.44–1.00)
GFR, Estimated: >60 mL/min (ref >60)
Glucose, Bld: 97 mg/dL (ref 70–99)
Potassium: 3.5 mmol/L (ref 3.5–5.1)
Sodium: 132 mmol/L — ABNORMAL LOW (ref 135–145)

## 2023-09-03 MED ORDER — DOXYCYCLINE HYCLATE 100 MG PO TABS: 100.0000 mg | ORAL_TABLET | Freq: Two times a day (BID) | ORAL | Status: DC

## 2023-09-03 MED ORDER — HYDROCODONE-ACETAMINOPHEN 5-325 MG PO TABS: 1.0000 | ORAL_TABLET | Freq: Four times a day (QID) | ORAL | 0 refills | Status: DC | PRN

## 2023-09-03 MED ORDER — CEFDINIR 300 MG PO CAPS: 300.0000 mg | ORAL_CAPSULE | Freq: Two times a day (BID) | ORAL | Status: DC

## 2023-09-03 NOTE — Plan of Care (Signed)

## 2023-09-03 NOTE — TOC Transition Note (Signed)
Transition of Care [facility]) - CM/SW Discharge Note   Patient Details  Name: Kathleen Pope MRN: [id number] Date of Birth: 1933/04/19  Transition of Care [facility]) CM/SW Contact:  Elliot Gault, LCSW Phone Number: 09/03/2023, 10:13 AM   Clinical Narrative:     Pt is medically stable for dc and will transfer to [facility] as planned.  DC clinical sent electronically. Rn to call report.  No other TOC needs for dc.  Final next level of care: Skilled Nursing Facility Barriers to Discharge: Barriers Resolved   Patient Goals and CMS Choice CMS Medicare.gov Compare Post Acute Care list provided to:: Patient Choice offered to / list presented to : Patient  Discharge Placement                Patient [facility] bed at: [facility] Patient to be transferred to facility by: w/c Name of family member notified: pt only Patient and family notified of of transfer: 09/03/23  Discharge Plan and Services Additional resources added to the After Visit Summary for   In-house Referral: Clinical Social Work   Post Acute Care Choice: Skilled Nursing Facility                               Social Determinants of Health (SDOH) Interventions SDOH Screenings   Food Insecurity: No Food Insecurity (08/30/2023)  Housing: Low Risk  (08/30/2023)  Transportation Needs: No Transportation Needs (08/30/2023)  Utilities: Not At Risk (08/30/2023)  Depression (PHQ2-9): Low Risk  (01/03/2023)  Tobacco Use: High Risk (08/31/2023)     Readmission Risk Interventions    07/19/2023    3:46 PM 01/25/2021   12:06 PM  Readmission Risk Prevention Plan  Post Dischage Appt Complete   Medication Screening Complete   Transportation Screening Complete Complete  HRI or Home Care Consult  Complete  Social Work Consult for Recovery Care Planning/Counseling  Complete  Palliative Care Screening  Not Applicable  Medication Review Oceanographer)  Complete

## 2023-09-03 NOTE — Progress Notes (Signed)
Patient discharged to [facility], transported by staff to [facility]. Discharge summary placed in discharge packet to give to facility nurse. Belongings sent with patient to facility.

## 2023-09-03 NOTE — Discharge Summary (Addendum)
Physician Discharge Summary   Patient: Kathleen Pope MRN: [id number] DOB: 30-Jun-1933  Admit date:     08/30/2023  Discharge date: 09/03/23  Discharge Physician: Onalee Hua Mariavictoria Nottingham   PCP: Elfredia Nevins, MD   Recommendations at discharge:   Please follow up with primary care provider within 1-2 weeks  Please repeat BMP and CBC in one week Maintain patient on 2L [place] and wean back to RA for saturation >92%     Hospital Course: 89 year old female with a history of right-sided lung cancer s/p XRT (03/31/23-04/12/23), tobacco abuse, COPD, GI bleed status post total colectomy end ileostomy, hypertension presenting with a mechanical fall resulting in subsequent left hip pain.  The patient was putting away her groceries when she felt like her legs got tangled up resulting in a mechanical fall.  She had denied any loss of consciousness.  The patient tried to stand again, but unfortunately sustained another fall. The patient denies any fevers, chills, chest pain, shortness breath, nausea, vomiting or diarrhea, abdominal pain, dysuria.  She did have 1 episode of emesis in the ED.  There was no blood.  She does report chronic dyspnea on exertion and coughing. In the ED, the patient was afebrile and hemodynamically stable with oxygen saturation down to 84% on room air.  She was placed on 2 L with saturation up to 94%.  WBC 14.6, hemoglobin 12.4, platelets 295.  Potassium was 3.2.  Serum creatinine 0.75.  COVID-19 PCR is negative.  UA was negative for pyuria.  EKG showed sinus rhythm with nonspecific ST changes.  CT of the left hip showed a displaced left femoral neck fracture.  There was an age-indeterminate right pubic bone fracture.  Chest x-ray showed right perihilar opacity. Orthopedic surgery, Dr. Dallas Schimke was consulted to assist.  Assessment and Plan:  Left femoral neck fracture -Orthopedic surgery consult appreciated -08/30/2023 CT left hip as discussed above -08/31/23--left hip hemiarthroplasty -[facility]  opioids -PT>>SNF   Severe sepsis -Presented with tachycardia, leukocytosis, and hypoxia -Secondary to pneumonia -lactate 1.2 -10/15 blood culture neg -started on ceftriaxone/doxy and IVF -sepsis physiology resolved   Lobar pneumonia -Personally reviewed chest x-ray-- RLL opacity -Check PCT <0.10 -Lactic acid 1.2 -Continue ceftriaxone and doxy>>changed to po -last dose abx on 09/06/23 PM   Right hip pain -10/17--pt complains right hip pain after getting up in chair -repeat xray pelvis--no new acute abnormalities -discussed with Dr. Dallas Schimke -continue prn Norco   Acute respiratory failure with hypoxia -Secondary to pneumonia -Wean oxygen as tolerated for saturation greater 92% -Stable on 2L   COPD -Continue Dulera -started duo nebs and brovana during hospitalization   Bronchogenic cancer of the right lung -Status post radiation -Follow-up Dr. Ellin Saba   Hypokalemia -Replete -Magnesium 1.9   Hyponatremia -due to volume depletion and poor solute intake -improved with IVF -encourage po intake   Essential hypertension -Continue home dose metoprolol tartrate              Consultants: ortho Procedures performed: left hip hemiarthroplasty  Disposition: Skilled nursing facility Diet recommendation:  Regular diet DISCHARGE MEDICATION: Allergies as of 09/03/2023   No Known Allergies      Medication List     TAKE these medications    acetaminophen 500 MG tablet Commonly known as: TYLENOL Take 2 tablets (1,000 mg total) by mouth every 6 (six) hours as needed for moderate pain or mild pain.   cefdinir 300 MG capsule Commonly known as: OMNICEF Take 1 capsule (300 mg total) by mouth every 12 (twelve)  hours. Last dose on 09/06/23 PM   doxycycline 100 MG tablet Commonly known as: VIBRA-TABS Take 1 tablet (100 mg total) by mouth every 12 (twelve) hours. Last dose on 09/06/23 PM   ECHINACEA PO Take 167 mg by mouth daily.   Ferrous Sulfate 27 MG  Tabs Take 54 mg by mouth daily with breakfast.   Fish Oil 1000 MG Caps Take 1,000 Units by mouth daily.   fluticasone-salmeterol 100-50 MCG/ACT Aepb Commonly known as: ADVAIR Inhale 1 puff into the lungs 2 (two) times daily.   HYDROcodone-acetaminophen 5-325 MG tablet Commonly known as: NORCO/VICODIN Take 1 tablet by mouth every 6 (six) hours as needed for moderate pain (pain score 4-6).   metoprolol tartrate 25 MG tablet Commonly known as: LOPRESSOR Take 0.5 tablets (12.5 mg total) by mouth 2 (two) times daily.   multivitamin with minerals tablet Take 1 tablet by mouth daily. Centrum Silver   Potassium 99 MG Tabs Take by mouth.   pravastatin 10 MG tablet Commonly known as: PRAVACHOL Take 10 mg by mouth daily.        Contact information for after-discharge care     Destination     [facility] .   Service: Skilled Nursing Contact information: [address] [place] [address] [phone]                    Discharge Exam: Filed Weights   08/30/23 1542 08/30/23 2107 08/31/23 0951  Weight: 41.7 kg 42 kg 42 kg   HEENT:  [place]/AT, No thrush, no icterus CV:  RRR, no rub, no S3, no S4 Lung:  diminished BS, bibasilar rales. No wheeze Abd:  soft/+BS, NT Ext:  No edema, no lymphangitis, no synovitis, no rash   Condition at discharge: stable  The results of significant diagnostics from this hospitalization (including imaging, microbiology, ancillary and laboratory) are listed below for reference.   Imaging Studies: DG Pelvis 1-2 Views  Result Date: 09/01/2023 CLINICAL DATA:  Bilateral hip pain.  Surgery on the left yesterday. EXAM: PELVIS - 1-2 VIEW COMPARISON:  08/31/2023 FINDINGS: Postoperative changes with a left hip hemiarthroplasty using non cemented component. Component appears well seated. No evidence of dislocation or fracture. Skin clips and soft tissue gas are consistent with recent surgery. Mild  degenerative changes in the right hip. Moderate degenerative changes in the lower lumbar spine. Pelvis appears intact. SI joints and symphysis pubis are not displaced. Vascular calcifications. IMPRESSION: Left hip hemiarthroplasty without significant change since prior study. No acute bony abnormalities. Electronically Signed   By: Burman Nieves M.D.   On: 09/01/2023 19:24   DG HIP UNILAT WITH PELVIS 2-3 VIEWS LEFT  Result Date: 08/31/2023 CLINICAL DATA:  Hip replacement for treatment of femoral neck fracture EXAM: DG HIP (WITH OR WITHOUT PELVIS) 2-3V LEFT COMPARISON:  08/30/2023 FINDINGS: Bipolar hip replacement on the left. Components appear well positioned. No radiographically detectable complication. IMPRESSION: Good appearance following bipolar hip replacement on the left. Electronically Signed   By: Paulina Fusi M.D.   On: 08/31/2023 15:36   CT HIP LEFT WO CONTRAST  Result Date: 08/30/2023 CLINICAL DATA:  Fracture of the left hip. EXAM: CT OF THE LEFT HIP WITHOUT CONTRAST TECHNIQUE: Multidetector CT imaging of the left hip was performed according to the standard protocol. Multiplanar CT image reconstructions were also generated. RADIATION DOSE REDUCTION: This exam was performed according to the departmental dose-optimization program which includes automated exposure control, adjustment of the mA and/or kV  according to patient size and/or use of iterative reconstruction technique. COMPARISON:  Left hip radiograph dated 08/30/2023. FINDINGS: Bones/Joint/Cartilage There is a displaced fracture of the left femoral neck. There is proximal migration of the femoral shaft with impaction on the lateral femoral head. No dislocation. The bones are osteopenic. Age indeterminate fracture of the right pubic bone adjacent to the pubic symphysis. Ligaments Suboptimally assessed by CT. Muscles and Tendons No intramuscular fluid collection or hematoma. Soft tissues No acute findings.  Aortoiliac atherosclerotic  disease. IMPRESSION: 1. Displaced fracture of the left femoral neck. 2. Age indeterminate fracture of the right pubic bone adjacent to the pubic symphysis. 3.  Aortic Atherosclerosis (ICD10-I70.0). Electronically Signed   By: Elgie Collard M.D.   On: 08/30/2023 22:23   DG Chest 1 View  Result Date: 08/30/2023 CLINICAL DATA:  Fall. EXAM: CHEST  1 VIEW COMPARISON:  July 21, 2023. FINDINGS: The heart size and mediastinal contours are within normal limits. Left lung is clear. Mild right perihilar atelectasis or infiltrate is noted. Small right pleural effusion is noted. The visualized skeletal structures are unremarkable. IMPRESSION: Mild right perihilar atelectasis or infiltrate is noted. Small right pleural effusion. Electronically Signed   By: Lupita Raider M.D.   On: 08/30/2023 18:45   DG Hip Unilat With Pelvis 2-3 Views Left  Result Date: 08/30/2023 CLINICAL DATA:  Left hip pain after fall. EXAM: DG HIP (WITH OR WITHOUT PELVIS) 2-3V LEFT COMPARISON:  None Available. FINDINGS: Moderately displaced proximal left femoral neck fracture is noted. IMPRESSION: Moderately displaced proximal left femoral neck fracture. Electronically Signed   By: Lupita Raider M.D.   On: 08/30/2023 18:43    Microbiology: Results for orders placed or performed during the hospital encounter of 08/30/23  Culture, blood (Routine X 2) w Reflex to ID Panel     Status: None (Preliminary result)   Collection Time: 08/30/23 10:02 PM   Specimen: BLOOD  Result Value Ref Range Status   Specimen Description BLOOD BLOOD LEFT ARM  Final   Special Requests   Final    BOTTLES DRAWN AEROBIC AND ANAEROBIC Blood Culture adequate volume   Culture   Final    NO GROWTH 4 DAYS Performed at [facility], [address], [place], [place] [address]    Report Status PENDING  Incomplete  Culture, blood (Routine X 2) w Reflex to ID Panel     Status: None (Preliminary result)   Collection Time: 08/30/23 10:04 PM   Specimen: BLOOD   Result Value Ref Range Status   Specimen Description BLOOD BLOOD LEFT ARM  Final   Special Requests   Final    BOTTLES DRAWN AEROBIC AND ANAEROBIC Blood Culture adequate volume   Culture   Final    NO GROWTH 4 DAYS Performed at [facility], [address] [place], [place] [address]    Report Status PENDING  Incomplete  SARS Coronavirus 2 by RT PCR (hospital order, performed in [facility] hospital lab) *cepheid single result test* Anterior Nasal Swab     Status: None   Collection Time: 08/30/23 10:30 PM   Specimen: Anterior Nasal Swab  Result Value Ref Range Status   SARS Coronavirus 2 by RT PCR NEGATIVE NEGATIVE Final    Comment: (NOTE) SARS-CoV-2 target nucleic acids are NOT DETECTED.  The SARS-CoV-2 RNA is generally detectable in upper and lower respiratory specimens during the acute phase of infection. The lowest concentration of SARS-CoV-2 viral copies this assay can detect is 250 copies / mL.  A negative result does not preclude SARS-CoV-2 infection and should not be used as the sole basis for treatment or other patient management decisions.  A negative result may occur with improper specimen collection / handling, submission of specimen other than nasopharyngeal swab, presence of viral mutation(s) within the areas targeted by this assay, and inadequate number of viral copies (<250 copies / mL). A negative result must be combined with clinical observations, patient history, and epidemiological information.  Fact Sheet for Patients:   RoadLapTop.co.za  Fact Sheet for Healthcare Providers: http://kim-miller.com/  This test is not yet approved or  cleared by the [place] FDA and has been authorized for detection and/or diagnosis of SARS-CoV-2 by FDA under an Emergency Use Authorization (EUA).  This EUA will remain in effect (meaning this test can be used) for the duration of the COVID-19 declaration under Section 564(b)(1)  of the Act, 21 U.S.C. section 360bbb-3(b)(1), unless the authorization is terminated or revoked sooner.  Performed at [facility], [address], [place], [place] [address]   Surgical PCR screen     Status: Abnormal   Collection Time: 08/30/23 10:30 PM   Specimen: Nasal Mucosa; Nasal Swab  Result Value Ref Range Status   MRSA, PCR POSITIVE (A) NEGATIVE Final    Comment: RESULT CALLED TO, READ BACK BY AND VERIFIED WITH: Karolee Ohs [id number], VIRAY,J    Staphylococcus aureus POSITIVE (A) NEGATIVE Final    Comment: Karolee Ohs [phone], VIRAY,J (NOTE) The Xpert SA Assay (FDA approved for NASAL specimens in patients 62 years of age and older), is one component of a comprehensive surveillance program. It is not intended to diagnose infection nor to guide or monitor treatment. Performed at [facility], [address] [place], [place] [address]     Labs: CBC: Recent Labs  Lab 08/30/23 1708 08/31/23 0422 09/01/23 0429 09/02/23 0427  WBC 14.6* 11.2* 10.9* 11.6*  NEUTROABS 12.9*  --   --   --   HGB 12.4 11.1* 9.8* 9.9*  HCT 35.5* 32.8* 29.3* 29.8*  MCV 96.2 98.5 99.0 97.7  PLT 321 295 252 243   Basic Metabolic Panel: Recent Labs  Lab 08/30/23 1708 08/31/23 0422 09/01/23 0429 09/02/23 0427 09/03/23 0422  NA 134* 134* 134* 129* 132*  K 3.2* 4.8 4.0 3.6 3.5  CL 98 101 102 97* 99  CO2 24 24 22 24 25   GLUCOSE 131* 142* 116* 114* 97  BUN 11 13 17 15 16   CREATININE 0.75 0.65 0.61 0.62 0.67  CALCIUM 9.0 8.3* 8.0* 7.9* 8.0*  MG 1.9  --   --   --  1.9   Liver Function Tests: No results for input(s): "AST", "ALT", "ALKPHOS", "BILITOT", "PROT", "ALBUMIN" in the last 168 hours. CBG: Recent Labs  Lab 08/31/23 0952  GLUCAP 141*    Discharge time spent: greater than 30 minutes.  Signed: Catarina Hartshorn, MD [facility] 09/03/2023

## 2023-09-04 LAB — CULTURE, BLOOD (ROUTINE X 2)
Culture: NO GROWTH
Culture: NO GROWTH
Special Requests: ADEQUATE
Special Requests: ADEQUATE

## 2023-09-05 ENCOUNTER — Encounter: Payer: Self-pay | Admitting: Family Medicine

## 2023-09-05 ENCOUNTER — Non-Acute Institutional Stay (SKILLED_NURSING_FACILITY): Payer: Self-pay | Admitting: Family Medicine

## 2023-09-05 DIAGNOSIS — E43 Unspecified severe protein-calorie malnutrition: Secondary | ICD-10-CM | POA: Diagnosis not present

## 2023-09-05 DIAGNOSIS — I10 Essential (primary) hypertension: Secondary | ICD-10-CM

## 2023-09-05 DIAGNOSIS — J189 Pneumonia, unspecified organism: Secondary | ICD-10-CM

## 2023-09-05 DIAGNOSIS — E782 Mixed hyperlipidemia: Secondary | ICD-10-CM

## 2023-09-05 DIAGNOSIS — E871 Hypo-osmolality and hyponatremia: Secondary | ICD-10-CM

## 2023-09-05 DIAGNOSIS — S72002A Fracture of unspecified part of neck of left femur, initial encounter for closed fracture: Secondary | ICD-10-CM

## 2023-09-05 DIAGNOSIS — J449 Chronic obstructive pulmonary disease, unspecified: Secondary | ICD-10-CM

## 2023-09-05 DIAGNOSIS — C3491 Malignant neoplasm of unspecified part of right bronchus or lung: Secondary | ICD-10-CM | POA: Diagnosis not present

## 2023-09-05 NOTE — Assessment & Plan Note (Signed)
Patient has had general decline in weight over several years. She has maintained her weight since this injury.  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  Consultation with Nutritionist may be appropriate.  Nutritional supplements may be appropriate.

## 2023-09-05 NOTE — Progress Notes (Signed)
Location:  [facility] Room Number: 124 Place of Service:  SNF (31)[facility]   CODE STATUS: Do Not Resuscitate, (+)MOST  No Known Allergies  Chief Complaint  Patient presents with   Hip Injury    Left hip fracture S/P hip arthroplasty   Admission to Skilled Nursing Facility at [facility] HPI:  The Discharge Summary for the hospitalization from 08/30/23 to 09/03/23 was reviewed.    HPI Principle Diagnosis requiring hospitalization: Left hip fracture   Brief Hospital course summary:  89 year old female with a history of right-sided lung cancer s/p XRT (03/31/23-04/12/23), tobacco abuse, COPD, GI bleed status post total colectomy end ileostomy, hypertension presenting with a fall resulting in subsequent left hip pain.  The patient was putting away her groceries when she felt like her left leg gave way.  She had denied any loss of consciousness.  The patient tried to stand again, but unfortunately sustained another fall. Patient was down for about 15 minutes as she drub herself to her phone.   In the ED, the patient was afebrile and hemodynamically stable with oxygen saturation down to 84% on room air.  She was placed on 2 L with saturation up to 94%. .   CT of the left hip showed a displaced left femoral neck fracture.  There was an age-indeterminate right pubic bone fracture.   Chest x-ray showed right perihilar opacity which was also present on pCXR.02/14/23 - Patient started on antibiotics empirically.  Orthopedic surgery, Dr. Dallas Schimke was consulted forleft hip arthroplasy performed on 08/31/23.  Of note, Ms Berling had a hospitalization for SBO 07/17/22 that resolved spontaneouly with conservative management.   Follow up instructions from patient's hospital healthcare providers:    Please follow up with primary care provider within 1-2 weeks  Please repeat BMP and CBC in one week Maintain patient on 2L [place] and wean back to RA for saturation  >92% ---------------------------------------------------------------------------------------------------------------------- Problems since hospital discharge: None ---------------------------------------------------------------------------------------------------------------------- Follow up appointments with specialists:  Pending: Dr Dallas Schimke (Ortho) F?U in 2 weeks to get staples out and reXray. Completed: no ---------------------------------------------------------------------------------------------------------------------- New medications started during hospitalization: Aspirin 81 mg daily; Cefdinir; Doxy; Norco Chronic medications stopped during hospitalization: None Patient's Medication List was updated in the EMR: yes ------------------------------------------------------------------------------------------------------------------------------------------------------------------- Prior to hip fracture ADLs Independent Needs Assistance Dependent  Bathing x    Dressing x    Ambulation x    Toileting x    Eating x     IADL Independent Needs Assistance Dependent  Cooking x    Housework x    Manage Medications x    Manage the telephone x    Shopping for food, clothes, Meds, etc x    Use transportation x    Manage Finances  X - Nephew helps balance check book     Past Medical History:  Diagnosis Date   Acute blood loss anemia 08/18/2012   Acute hypoxic respiratory failure (HCC) 05/12/2022   Anemia    pt denies, yet takes iron   AVM (arteriovenous malformation) of colon 08/18/2012   Benign neoplasm of ascending colon    Benign neoplasm of cecum    Cavitating mass in right upper lung lobe 01/02/2023   Chronic airway obstruction, not elsewhere classified    Complication of anesthesia    COPD (chronic obstructive pulmonary disease) (HCC)    De Quervain's disease (tenosynovitis) 03/14/2014   Diverticulosis    External hemorrhoids 03/08/2013   GI hemorrhage from post-treatment  cecal ulcer 08/18/2012  Heart murmur    systolic with bil rad into carotids   Hemorrhoids    History of radiation therapy    5 cylces stereotactic body radiotherapy   HTN (hypertension)    Hypercholesteremia    Hyponatremia 07/19/2023   Lower GI bleed multiple   AVM's   Lung cancer (HCC) 02/14/2023   Lung mass 02/05/2023   Oxygen deficiency    PONV (postoperative nausea and vomiting)    Pressure injury of skin 07/19/2023   Prolonged QT interval 08/30/2023   Severe sepsis (HCC) 08/30/2023    Past Surgical History:  Procedure Laterality Date   BRONCHIAL BIOPSY  02/14/2023   Procedure: BRONCHIAL BIOPSIES;  Surgeon: Leslye Peer, MD;  Location: [facility] ENDOSCOPY;  Service: Pulmonary;;   BRONCHIAL BRUSHINGS  02/14/2023   Procedure: BRONCHIAL BRUSHINGS;  Surgeon: Leslye Peer, MD;  Location: [facility] ENDOSCOPY;  Service: Pulmonary;;   BRONCHIAL NEEDLE ASPIRATION BIOPSY  02/14/2023   Procedure: BRONCHIAL NEEDLE ASPIRATION BIOPSIES;  Surgeon: Leslye Peer, MD;  Location: [facility] ENDOSCOPY;  Service: Pulmonary;;   BRONCHIAL WASHINGS  02/14/2023   Procedure: BRONCHIAL WASHINGS;  Surgeon: Leslye Peer, MD;  Location: [facility] ENDOSCOPY;  Service: Pulmonary;;   BUNIONECTOMY     left   COLECTOMY     COLON RESECTION N/A 01/17/2021   Procedure: EXPLORATORY LAPAROTOMY COMPLETION COLECTOMY ileostomy;  Surgeon: Axel Filler, MD;  Location: [facility] ORS;  Service: General;  Laterality: N/A;   COLONOSCOPY  08/10/2012   Procedure: COLONOSCOPY;  Surgeon: Meryl Dare, MD,FACG;  Location: [facility] ENDOSCOPY;  Service: Endoscopy;  Laterality: N/A;   COLONOSCOPY  08/19/2012   Procedure: COLONOSCOPY;  Surgeon: Iva Boop, MD;  Location: [facility] ENDOSCOPY;  Service: Endoscopy;  Laterality: N/A;   COLONOSCOPY N/A 03/11/2013   Procedure: COLONOSCOPY;  Surgeon: Hilarie Fredrickson, MD;  Location: [facility] ENDOSCOPY;  Service: Endoscopy;  Laterality: N/A;   COLONOSCOPY N/A 01/16/2021   Procedure: COLONOSCOPY;  Surgeon: Sherrilyn Rist, MD;  Location: [facility] ENDOSCOPY;  Service: Gastroenterology;  Laterality: N/A;   COLONOSCOPY WITH PROPOFOL N/A 01/10/2021   Procedure: COLONOSCOPY WITH PROPOFOL;  Surgeon: Hilarie Fredrickson, MD;  Location: [facility] ENDOSCOPY;  Service: Endoscopy;  Laterality: N/A;   COLOSTOMY N/A 01/15/2021   Procedure: COLOSTOMY;  Surgeon: Harriette Bouillon, MD;  Location: [facility] ORS;  Service: General;  Laterality: N/A;   ESOPHAGOGASTRODUODENOSCOPY (EGD) WITH PROPOFOL N/A 01/14/2021   Procedure: ESOPHAGOGASTRODUODENOSCOPY (EGD) WITH PROPOFOL;  Surgeon: Sherrilyn Rist, MD;  Location: [facility] ENDOSCOPY;  Service: Endoscopy;  Laterality: N/A;   FIDUCIAL MARKER PLACEMENT  02/14/2023   Procedure: FIDUCIAL MARKER PLACEMENT;  Surgeon: Leslye Peer, MD;  Location: [facility] ENDOSCOPY;  Service: Pulmonary;;   FLEXIBLE SIGMOIDOSCOPY N/A 01/14/2021   Procedure: Arnell Sieving;  Surgeon: Sherrilyn Rist, MD;  Location: [facility] ENDOSCOPY;  Service: Endoscopy;  Laterality: N/A;   HIP ARTHROPLASTY Left 08/31/2023   Procedure: ARTHROPLASTY BIPOLAR HIP (HEMIARTHROPLASTY);  Surgeon: Oliver Barre, MD;  Location: [facility] ORS;  Service: Orthopedics;  Laterality: Left;   HOT HEMOSTASIS N/A 01/10/2021   Procedure: HOT HEMOSTASIS (ARGON PLASMA COAGULATION/BICAP);  Surgeon: Hilarie Fredrickson, MD;  Location: [facility] ENDOSCOPY;  Service: Endoscopy;  Laterality: N/A;   IR ANGIOGRAM SELECTIVE EACH ADDITIONAL VESSEL  01/12/2021   IR ANGIOGRAM SELECTIVE EACH ADDITIONAL VESSEL  01/12/2021   IR ANGIOGRAM SELECTIVE EACH ADDITIONAL VESSEL  01/12/2021   IR ANGIOGRAM VISCERAL SELECTIVE  01/09/2021   IR ANGIOGRAM VISCERAL SELECTIVE  01/12/2021   IR EMBO ARTERIAL NOT HEMORR HEMANG  INC GUIDE ROADMAPPING  01/12/2021   IR US GUIDE VASC ACCESS RIGHT  01/09/2021   IR US GUIDE VASC ACCESS RIGHT  01/12/2021   JOINT REPLACEMENT     PARTIAL COLECTOMY N/A 01/15/2021   Procedure: PARTIAL COLECTOMY;  Surgeon: Harriette Bouillon, MD;  Location: [facility] ORS;  Service: General;  Laterality:  N/A;   POLYPECTOMY  01/10/2021   Procedure: POLYPECTOMY;  Surgeon: Hilarie Fredrickson, MD;  Location: [facility] ENDOSCOPY;  Service: Endoscopy;;   SKIN CANCER EXCISION Right 12/2022   Right Leg above knee   TONSILLECTOMY     VIDEO BRONCHOSCOPY WITH ENDOBRONCHIAL ULTRASOUND Bilateral 02/14/2023   Procedure: VIDEO BRONCHOSCOPY WITH ENDOBRONCHIAL ULTRASOUND;  Surgeon: Leslye Peer, MD;  Location: [facility] ENDOSCOPY;  Service: Pulmonary;  Laterality: Bilateral;   VIDEO BRONCHOSCOPY WITH RADIAL ENDOBRONCHIAL ULTRASOUND  02/14/2023   Procedure: VIDEO BRONCHOSCOPY WITH RADIAL ENDOBRONCHIAL ULTRASOUND;  Surgeon: Leslye Peer, MD;  Location: [facility] ENDOSCOPY;  Service: Pulmonary;;    Social History   Socioeconomic History   Marital status: Widowed    Spouse name: Not on file   Number of children: 1   Years of education: Not on file   Highest education level: Not on file  Occupational History   Occupation: retired    Associate Professor: RETIRED  Tobacco Use   Smoking status: Every Day    Current packs/day: 1.00    Average packs/day: 1 pack/day for 59.0 years (59.0 ttl pk-yrs)    Types: Cigarettes   Smokeless tobacco: Never   Tobacco comments:    Started smoking at age 22  Vaping Use   Vaping status: Never Used  Substance and Sexual Activity   Alcohol use: No   Drug use: No   Sexual activity: Never  Other Topics Concern   Not on file  Social History Narrative   Lives Alone.  A Nephew checks in on her.    Has an elderly dog that is precious to patient.    Daughter died 10/11/21   (+) CaneSmoke less than pack per day since age 38   Social Determinants of Health   Financial Resource Strain: Not on file  Food Insecurity: No Food Insecurity (08/30/2023)   Hunger Vital Sign    Worried About Running Out of Food in the Last Year: Never true    Ran Out of Food in the Last Year: Never true  Transportation Needs: No Transportation Needs (08/30/2023)   PRAPARE - Administrator, Civil Service (Medical):  No    Lack of Transportation (Non-Medical): No  Physical Activity: Not on file  Stress: Not on file  Social Connections: Not on file  Intimate Partner Violence: Not At Risk (08/30/2023)   Humiliation, Afraid, Rape, and Kick questionnaire    Fear of Current or Ex-Partner: No    Emotionally Abused: No    Physically Abused: No    Sexually Abused: No   Family History  Problem Relation Age of Onset   Breast cancer Mother    Lung cancer Father    Breast cancer Sister    Anuerysm Brother        in stomach   Colon cancer Neg Hx    Esophageal cancer Neg Hx    Rectal cancer Neg Hx       VITAL SIGNS BP 110/60   Pulse 80   Temp 97.8 F (36.6 C)   Wt 96 lb (43.5 kg)   SpO2 96% Comment: O2 2 L/min [place]  BMI 18.14 kg/m   Outpatient  Encounter Medications as of 09/05/2023  Medication Sig Note   cefdinir (OMNICEF) 300 MG capsule Take 1 capsule (300 mg total) by mouth every 12 (twelve) hours. Last dose on 09/06/23 PM 09/05/2023: End 09/06/23   doxycycline (VIBRA-TABS) 100 MG tablet Take 1 tablet (100 mg total) by mouth every 12 (twelve) hours. Last dose on 09/06/23 PM 09/05/2023: End 09/06/23   ECHINACEA PO Take 167 mg by mouth daily.    Ferrous Sulfate 27 MG TABS Take 54 mg by mouth daily with breakfast.    fluticasone-salmeterol (ADVAIR) 100-50 MCG/ACT AEPB Inhale 1 puff into the lungs 2 (two) times daily. 08/30/2023: Pt states she doesn't use it twice a day, unsure of how often she uses it, states she's last had within the past week.   HYDROcodone-acetaminophen (NORCO/VICODIN) 5-325 MG tablet Take 1 tablet by mouth every 6 (six) hours as needed for moderate pain (pain score 4-6).    metoprolol tartrate (LOPRESSOR) 25 MG tablet Take 0.5 tablets (12.5 mg total) by mouth 2 (two) times daily.    Multiple Vitamins-Minerals (MULTIVITAMIN WITH MINERALS) tablet Take 1 tablet by mouth daily. Centrum Silver    Omega-3 Fatty Acids (FISH OIL) 1000 MG CAPS Take 1,000 Units by mouth daily.    OXYGEN  Inhale 2 L into the lungs.    Potassium 99 MG TABS Take by mouth.    pravastatin (PRAVACHOL) 10 MG tablet Take 10 mg by mouth daily.    acetaminophen (TYLENOL) 500 MG tablet Take 2 tablets (1,000 mg total) by mouth every 6 (six) hours as needed for moderate pain or mild pain.    No facility-administered encounter medications on file as of 09/05/2023.     SIGNIFICANT DIAGNOSTIC EXAMS       ASSESSMENT/ PLAN:     Synthia Innocent NP [facility]  Contact [phone] Monday through Friday 8am- 5pm  After hours call [phone]

## 2023-09-05 NOTE — Assessment & Plan Note (Signed)
Established problem Patient runs in low normal to low sodium for last several years. Not symptomatic Does not appear volume overload. Could be dehydrated, but given the chronicity I suspect SIADH from her pulmonary cancer.   Monitor.

## 2023-09-05 NOTE — Assessment & Plan Note (Signed)
Established problem Well Controlled.  No signs of complications, medication side effects, or red flags. Continue pravastatin

## 2023-09-05 NOTE — Assessment & Plan Note (Signed)
Established problem Well Controlled. Patient is at goal of <130/80. No signs of complications, medication side effects, or red flags. Continue metoprolol

## 2023-09-05 NOTE — Progress Notes (Signed)
Location:  [facility] Room Number: 124 Place of Service:  SNF (31)   CODE STATUS: dnr   No Known Allergies   Chief Complaint  Patient presents with   Hospitalization Follow-up    HPI:  She is a 89 year old woman who has been hospitalized from 08-30-23 through 09-03-23. Her past medical history includes: GIB status post total colectomy with ileostomy; hypertension; anemia; COPD; right side lung cancer status radiation (03-31-23-04-02-23). Tobacco abuse. She presented to the ED after sustaining a fall got up and fell a second time. She did have shortness of breath present. She suffered a left femoral neck fracture. On 08-31-23: had a left hip hemiarthroplasty. She is here for short term rehab with her goal to return back home. She will continue to be followed for her chronic illnesses including:   Past Medical History:  Diagnosis Date   Acute blood loss anemia 08/18/2012   Acute hypoxic respiratory failure (HCC) 05/12/2022   Anemia    pt denies, yet takes iron   AVM (arteriovenous malformation) of colon 08/18/2012   Benign neoplasm of ascending colon    Benign neoplasm of cecum    Cavitating mass in right upper lung lobe 01/02/2023   Chronic airway obstruction, not elsewhere classified    Complication of anesthesia    COPD (chronic obstructive pulmonary disease) (HCC)    De Quervain's disease (tenosynovitis) 03/14/2014   Diverticulosis    External hemorrhoids 03/08/2013   GI hemorrhage from post-treatment cecal ulcer 08/18/2012   Heart murmur    systolic with bil rad into carotids   Hemorrhoids    History of radiation therapy    5 cylces stereotactic body radiotherapy   HTN (hypertension)    Hypercholesteremia    Hyponatremia 07/19/2023   Lower GI bleed multiple   AVM's   Lung cancer (HCC) 02/14/2023   Lung mass 02/05/2023   Oxygen deficiency    PONV (postoperative nausea and vomiting)    Pressure injury of skin 07/19/2023   Prolonged QT  interval 08/30/2023   Severe sepsis (HCC) 08/30/2023    Past Surgical History:  Procedure Laterality Date   BRONCHIAL BIOPSY  02/14/2023   Procedure: BRONCHIAL BIOPSIES;  Surgeon: Leslye Peer, MD;  Location: [facility] ENDOSCOPY;  Service: Pulmonary;;   BRONCHIAL BRUSHINGS  02/14/2023   Procedure: BRONCHIAL BRUSHINGS;  Surgeon: Leslye Peer, MD;  Location: [facility] ENDOSCOPY;  Service: Pulmonary;;   BRONCHIAL NEEDLE ASPIRATION BIOPSY  02/14/2023   Procedure: BRONCHIAL NEEDLE ASPIRATION BIOPSIES;  Surgeon: Leslye Peer, MD;  Location: [facility] ENDOSCOPY;  Service: Pulmonary;;   BRONCHIAL WASHINGS  02/14/2023   Procedure: BRONCHIAL WASHINGS;  Surgeon: Leslye Peer, MD;  Location: [facility] ENDOSCOPY;  Service: Pulmonary;;   BUNIONECTOMY     left   COLECTOMY     COLON RESECTION N/A 01/17/2021   Procedure: EXPLORATORY LAPAROTOMY COMPLETION COLECTOMY ileostomy;  Surgeon: Axel Filler, MD;  Location: [facility] ORS;  Service: General;  Laterality: N/A;   COLONOSCOPY  08/10/2012   Procedure: COLONOSCOPY;  Surgeon: Meryl Dare, MD,FACG;  Location: [facility] ENDOSCOPY;  Service: Endoscopy;  Laterality: N/A;   COLONOSCOPY  08/19/2012   Procedure: COLONOSCOPY;  Surgeon: Iva Boop, MD;  Location: [facility] ENDOSCOPY;  Service: Endoscopy;  Laterality: N/A;   COLONOSCOPY N/A 03/11/2013   Procedure: COLONOSCOPY;  Surgeon: Hilarie Fredrickson, MD;  Location: [facility] ENDOSCOPY;  Service: Endoscopy;  Laterality: N/A;   COLONOSCOPY N/A 01/16/2021   Procedure: COLONOSCOPY;  Surgeon: Charlie Pitter III,  MD;  Location: [facility] ENDOSCOPY;  Service: Gastroenterology;  Laterality: N/A;   COLONOSCOPY WITH PROPOFOL N/A 01/10/2021   Procedure: COLONOSCOPY WITH PROPOFOL;  Surgeon: Hilarie Fredrickson, MD;  Location: [facility] ENDOSCOPY;  Service: Endoscopy;  Laterality: N/A;   COLOSTOMY N/A 01/15/2021   Procedure: COLOSTOMY;  Surgeon: Harriette Bouillon, MD;  Location: [facility] ORS;  Service: General;  Laterality: N/A;   ESOPHAGOGASTRODUODENOSCOPY (EGD) WITH PROPOFOL N/A  01/14/2021   Procedure: ESOPHAGOGASTRODUODENOSCOPY (EGD) WITH PROPOFOL;  Surgeon: Sherrilyn Rist, MD;  Location: [facility] ENDOSCOPY;  Service: Endoscopy;  Laterality: N/A;   FIDUCIAL MARKER PLACEMENT  02/14/2023   Procedure: FIDUCIAL MARKER PLACEMENT;  Surgeon: Leslye Peer, MD;  Location: [facility] ENDOSCOPY;  Service: Pulmonary;;   FLEXIBLE SIGMOIDOSCOPY N/A 01/14/2021   Procedure: Arnell Sieving;  Surgeon: Sherrilyn Rist, MD;  Location: [facility] ENDOSCOPY;  Service: Endoscopy;  Laterality: N/A;   HIP ARTHROPLASTY Left 08/31/2023   Procedure: ARTHROPLASTY BIPOLAR HIP (HEMIARTHROPLASTY);  Surgeon: Oliver Barre, MD;  Location: [facility] ORS;  Service: Orthopedics;  Laterality: Left;   HOT HEMOSTASIS N/A 01/10/2021   Procedure: HOT HEMOSTASIS (ARGON PLASMA COAGULATION/BICAP);  Surgeon: Hilarie Fredrickson, MD;  Location: [facility] ENDOSCOPY;  Service: Endoscopy;  Laterality: N/A;   IR ANGIOGRAM SELECTIVE EACH ADDITIONAL VESSEL  01/12/2021   IR ANGIOGRAM SELECTIVE EACH ADDITIONAL VESSEL  01/12/2021   IR ANGIOGRAM SELECTIVE EACH ADDITIONAL VESSEL  01/12/2021   IR ANGIOGRAM VISCERAL SELECTIVE  01/09/2021   IR ANGIOGRAM VISCERAL SELECTIVE  01/12/2021   IR EMBO ARTERIAL NOT HEMORR HEMANG INC GUIDE ROADMAPPING  01/12/2021   IR US GUIDE VASC ACCESS RIGHT  01/09/2021   IR US GUIDE VASC ACCESS RIGHT  01/12/2021   JOINT REPLACEMENT     PARTIAL COLECTOMY N/A 01/15/2021   Procedure: PARTIAL COLECTOMY;  Surgeon: Harriette Bouillon, MD;  Location: [facility] OR[facility];  Service: General;  Laterality: N/A;   POLYPECTOMY  01/10/2021   Procedure: POLYPECTOMY;  Surgeon: Hilarie Fredrickson, MD;  Location: [facility] ENDOSCOPY;  Service: Endoscopy;;   SKIN CANCER EXCISION Right 12/2022   Right Leg above knee   TONSILLECTOMY     VIDEO BRONCHOSCOPY WITH ENDOBRONCHIAL ULTRASOUND Bilateral 02/14/2023   Procedure: VIDEO BRONCHOSCOPY WITH ENDOBRONCHIAL ULTRASOUND;  Surgeon: Leslye Peer, MD;  Location: [facility] ENDOSCOPY;  Service: Pulmonary;  Laterality: Bilateral;    VIDEO BRONCHOSCOPY WITH RADIAL ENDOBRONCHIAL ULTRASOUND  02/14/2023   Procedure: VIDEO BRONCHOSCOPY WITH RADIAL ENDOBRONCHIAL ULTRASOUND;  Surgeon: Leslye Peer, MD;  Location: [facility] ENDOSCOPY;  Service: Pulmonary;;    Social History   Socioeconomic History   Marital status: Widowed    Spouse name: Not on file   Number of children: 1   Years of education: Not on file   Highest education level: Not on file  Occupational History   Occupation: retired    Associate Professor: RETIRED  Tobacco Use   Smoking status: Every Day    Current packs/day: 1.00    Average packs/day: 1 pack/day for 59.0 years (59.0 ttl pk-yrs)    Types: Cigarettes   Smokeless tobacco: Never   Tobacco comments:    Started smoking at age 33  Vaping Use   Vaping status: Never Used  Substance and Sexual Activity   Alcohol use: No   Drug use: No   Sexual activity: Never  Other Topics Concern   Not on file  Social History Narrative   Lives Alone.  A Nephew checks in on her.    Has an elderly dog that is precious to patient.  Daughter died 2022   (+) CaneSmoke less than pack per day since age 7   Social Determinants of Health   Financial Resource Strain: Not on file  Food Insecurity: No Food Insecurity (08/30/2023)   Hunger Vital Sign    Worried About Running Out of Food in the Last Year: Never true    Ran Out of Food in the Last Year: Never true  Transportation Needs: No Transportation Needs (08/30/2023)   PRAPARE - Administrator, Civil Service (Medical): No    Lack of Transportation (Non-Medical): No  Physical Activity: Not on file  Stress: Not on file  Social Connections: Not on file  Intimate Partner Violence: Not At Risk (08/30/2023)   Humiliation, Afraid, Rape, and Kick questionnaire    Fear of Current or Ex-Partner: No    Emotionally Abused: No    Physically Abused: No    Sexually Abused: No   Family History  Problem Relation Age of Onset   Breast cancer Mother    Lung cancer  Father    Breast cancer Sister    Anuerysm Brother        in stomach   Colon cancer Neg Hx    Esophageal cancer Neg Hx    Rectal cancer Neg Hx       VITAL SIGNS BP 110/60   Pulse 80   Temp 97.8 F (36.6 C)   Wt 96 lb (43.5 kg)   SpO2 96% Comment: O2 2 L/min [place]  BMI 18.14 kg/m   Outpatient Encounter Medications as of 09/05/2023  Medication Sig Note   cefdinir (OMNICEF) 300 MG capsule Take 1 capsule (300 mg total) by mouth every 12 (twelve) hours. Last dose on 09/06/23 PM 09/05/2023: End 09/06/23   doxycycline (VIBRA-TABS) 100 MG tablet Take 1 tablet (100 mg total) by mouth every 12 (twelve) hours. Last dose on 09/06/23 PM 09/05/2023: End 09/06/23   ECHINACEA PO Take 167 mg by mouth daily.    Ferrous Sulfate 27 MG TABS Take 54 mg by mouth daily with breakfast.    fluticasone-salmeterol (ADVAIR) 100-50 MCG/ACT AEPB Inhale 1 puff into the lungs 2 (two) times daily. 08/30/2023: Pt states she doesn't use it twice a day, unsure of how often she uses it, states she's last had within the past week.   HYDROcodone-acetaminophen (NORCO/VICODIN) 5-325 MG tablet Take 1 tablet by mouth every 6 (six) hours as needed for moderate pain (pain score 4-6).    metoprolol tartrate (LOPRESSOR) 25 MG tablet Take 0.5 tablets (12.5 mg total) by mouth 2 (two) times daily.    Multiple Vitamins-Minerals (MULTIVITAMIN WITH MINERALS) tablet Take 1 tablet by mouth daily. Centrum Silver    Omega-3 Fatty Acids (FISH OIL) 1000 MG CAPS Take 1,000 Units by mouth daily.    OXYGEN Inhale 2 L into the lungs.    Potassium 99 MG TABS Take by mouth.    pravastatin (PRAVACHOL) 10 MG tablet Take 10 mg by mouth daily.    acetaminophen (TYLENOL) 500 MG tablet Take 2 tablets (1,000 mg total) by mouth every 6 (six) hours as needed for moderate pain or mild pain.    No facility-administered encounter medications on file as of 09/05/2023.     SIGNIFICANT DIAGNOSTIC EXAMS  TODAY  08-30-23: wbc 14.6; hgb 12.4; hct 35.5; mcv  96.2 plt 321; glucose 131; bun 11; creat 0.75; k+ 3.2; na++ 134; ca 9.0; gfr >60; mag 1.9 blood culture: no growth 09-02-23: wbc 11.6; hgb 9.9; hct 29.8; mcv 97.7 plt  243; glucose 144; bun 15; creat 0.62; k+ 3.6; na++ 129; ca 7.9; gfr >60 09-03-23: glucose 97; bun 16; creat 0.67; k+ 3.5; na++ 132; ca 8.0; gfr >60 mag 1.9   Review of Systems  Constitutional:  Negative for malaise/fatigue.  Respiratory:  Negative for cough and shortness of breath.   Cardiovascular:  Negative for chest pain, palpitations and leg swelling.  Gastrointestinal:  Negative for abdominal pain, constipation and heartburn.  Musculoskeletal:  Positive for joint pain. Negative for back pain and myalgias.  Skin: Negative.   Neurological:  Negative for dizziness.  Psychiatric/Behavioral:  The patient is not nervous/anxious.    Physical Exam Constitutional:      General: She is not in acute distress.    Appearance: She is underweight. She is not diaphoretic.  Neck:     Thyroid: No thyromegaly.  Cardiovascular:     Rate and Rhythm: Normal rate and regular rhythm.     Pulses: Normal pulses.     Heart sounds: Normal heart sounds.  Pulmonary:     Effort: Pulmonary effort is normal. No respiratory distress.     Breath sounds: Normal breath sounds.  Abdominal:     General: Bowel sounds are normal. There is no distension.     Palpations: Abdomen is soft.     Tenderness: There is no abdominal tenderness.     Comments: Right upper quad ileostomy   Musculoskeletal:     Cervical back: Neck supple.     Right lower leg: No edema.     Left lower leg: No edema.     Comments: Status post left hip hemiarthroplasty   Lymphadenopathy:     Cervical: No cervical adenopathy.  Skin:    General: Skin is warm and dry.  Neurological:     Mental Status: She is alert and oriented to person, place, and time.  Psychiatric:        Mood and Affect: Mood normal.       ASSESSMENT/ PLAN:  TODAY  CAP: will complete onmicef 300 mg  twice daily through 09-06-23; doxycycline 100 mg twice daily through 09-06-23.   2. Left hip fracture: will follow up with orthopedics and will continue vicodin 5/325 mg nightly for the next week. Will continue therapy as directed  3. Protein calorie malnutrition: will continue supplements as directed  4.  ABLA: hgb 9.9 is on iron daily  5. Hypertension: will continue lopressor 12.5 mg twice daily   6. Lung cancer: is status post radiation therapy; will continue advair 100/50 twice daily   7. Hyperlipidemia: will continue pravachol 10 mg daily    Synthia Innocent NP [facility]  call [phone]

## 2023-09-05 NOTE — Assessment & Plan Note (Signed)
New problem Complete empiric course of cefdinir and doxy on 10/22. Again, I am not certain the CXR findings are new.

## 2023-09-05 NOTE — Assessment & Plan Note (Signed)
Established problem. Stable. Patient is at goal of symptom control. I suspect the right hilar fullness on hospital admission CXR represents her bronchgenic CA +/- XRT changes. Image similar to pCXR in 02/14/2023.  F/U with Dr Enzo Montgomery (Onc) in 6 months.

## 2023-09-05 NOTE — Assessment & Plan Note (Signed)
New problem - initial encounter S/P left hip arthroplasty Starting to work with Physical Therapy/Occupational Therapy - motivated to get home to look after her dog.  Pain in hip primarily at night. Consider short-term use opioid at bedtime  - Avoid NSAIDs given history of severe GI hemorrhage requring colectomy OP F/U with Dr Dallas Schimke in two weeks for staples and rpt Xray

## 2023-09-05 NOTE — Addendum Note (Signed)
Addended by: Sharee Holster on: 09/05/2023 03:04 PM   Modules accepted: Orders, Level of Service

## 2023-09-06 ENCOUNTER — Non-Acute Institutional Stay (SKILLED_NURSING_FACILITY): Payer: Self-pay | Admitting: Adult Health

## 2023-09-06 ENCOUNTER — Other Ambulatory Visit: Payer: Self-pay | Admitting: Adult Health

## 2023-09-06 ENCOUNTER — Encounter: Payer: Self-pay | Admitting: Adult Health

## 2023-09-06 DIAGNOSIS — S72002S Fracture of unspecified part of neck of left femur, sequela: Secondary | ICD-10-CM

## 2023-09-06 DIAGNOSIS — J449 Chronic obstructive pulmonary disease, unspecified: Secondary | ICD-10-CM

## 2023-09-06 DIAGNOSIS — Z932 Ileostomy status: Secondary | ICD-10-CM | POA: Diagnosis not present

## 2023-09-06 DIAGNOSIS — C3491 Malignant neoplasm of unspecified part of right bronchus or lung: Secondary | ICD-10-CM | POA: Diagnosis not present

## 2023-09-06 MED ORDER — POTASSIUM 99 MG PO TABS: 99.0000 mg | ORAL_TABLET | Freq: Every day | ORAL | 0 refills | Status: AC

## 2023-09-06 MED ORDER — METOPROLOL TARTRATE 25 MG PO TABS: 12.5000 mg | ORAL_TABLET | Freq: Two times a day (BID) | ORAL | 0 refills | Status: AC

## 2023-09-06 MED ORDER — FLUTICASONE-SALMETEROL 100-50 MCG/ACT IN AEPB: 1.0000 | INHALATION_SPRAY | Freq: Two times a day (BID) | RESPIRATORY_TRACT | 0 refills | Status: DC

## 2023-09-06 MED ORDER — PRAVASTATIN SODIUM 10 MG PO TABS: 10.0000 mg | ORAL_TABLET | Freq: Every day | ORAL | 0 refills | Status: AC

## 2023-09-06 NOTE — Progress Notes (Signed)
Location:  [facility] Room Number: 124 Place of Service:  SNF ([id number])   CODE STATUS: dnr   No Known Allergies  Chief Complaint  Patient presents with   Discharge Note    HPI:  She is being discharged to home with home health for pt/ot. She does no need any dme. She will need her prescriptions written and will need to follow up with her medical provider. She had been hospitalized for a left femur fracture. She has required 02 while in the hospital; she will need to be weaned off if able prior to hospitalization. She is wanting to return home since she is a smoker.   Past Medical History:  Diagnosis Date   Acute blood loss anemia 08/18/2012   Acute hypoxic respiratory failure (HCC) 05/12/2022   Anemia    pt denies, yet takes iron   AVM (arteriovenous malformation) of colon 08/18/2012   Benign neoplasm of ascending colon    Benign neoplasm of cecum    Cavitating mass in right upper lung lobe 01/02/2023   Chronic airway obstruction, not elsewhere classified    Complication of anesthesia    COPD (chronic obstructive pulmonary disease) (HCC)    De Quervain's disease (tenosynovitis) 03/14/2014   Diverticulosis    External hemorrhoids 03/08/2013   GI hemorrhage from post-treatment cecal ulcer 08/18/2012   Heart murmur    systolic with bil rad into carotids   Hemorrhoids    History of radiation therapy    5 cylces stereotactic body radiotherapy   HTN (hypertension)    Hypercholesteremia    Hyponatremia 07/19/2023   Lower GI bleed multiple   AVM's   Lung cancer (HCC) 02/14/2023   Lung mass 02/05/2023   Oxygen deficiency    PONV (postoperative nausea and vomiting)    Pressure injury of skin 07/19/2023   Prolonged QT interval 08/30/2023   Severe sepsis (HCC) 08/30/2023    Past Surgical History:  Procedure Laterality Date   BRONCHIAL BIOPSY  02/14/2023   Procedure: BRONCHIAL BIOPSIES;  Surgeon: Leslye Peer, MD;  Location: [facility] ENDOSCOPY;  Service:  Pulmonary;;   BRONCHIAL BRUSHINGS  02/14/2023   Procedure: BRONCHIAL BRUSHINGS;  Surgeon: Leslye Peer, MD;  Location: [facility] ENDOSCOPY;  Service: Pulmonary;;   BRONCHIAL NEEDLE ASPIRATION BIOPSY  02/14/2023   Procedure: BRONCHIAL NEEDLE ASPIRATION BIOPSIES;  Surgeon: Leslye Peer, MD;  Location: [facility] ENDOSCOPY;  Service: Pulmonary;;   BRONCHIAL WASHINGS  02/14/2023   Procedure: BRONCHIAL WASHINGS;  Surgeon: Leslye Peer, MD;  Location: [facility] ENDOSCOPY;  Service: Pulmonary;;   BUNIONECTOMY     left   COLECTOMY     COLON RESECTION N/A 01/17/2021   Procedure: EXPLORATORY LAPAROTOMY COMPLETION COLECTOMY ileostomy;  Surgeon: Axel Filler, MD;  Location: [facility] ORS;  Service: General;  Laterality: N/A;   COLONOSCOPY  08/10/2012   Procedure: COLONOSCOPY;  Surgeon: Meryl Dare, MD,FACG;  Location: [facility] ENDOSCOPY;  Service: Endoscopy;  Laterality: N/A;   COLONOSCOPY  08/19/2012   Procedure: COLONOSCOPY;  Surgeon: Iva Boop, MD;  Location: [facility] ENDOSCOPY;  Service: Endoscopy;  Laterality: N/A;   COLONOSCOPY N/A 03/11/2013   Procedure: COLONOSCOPY;  Surgeon: Hilarie Fredrickson, MD;  Location: [facility]OSCOPY;  Service: Endoscopy;  Laterality: N/A;   COLONOSCOPY N/A 01/16/2021   Procedure: COLONOSCOPY;  Surgeon: Sherrilyn Rist, MD;  Location: [facility]COPY;  Service: Gastroenterology;  Laterality: N/A;   COLONOSCOPY WITH PROPOFOL N/A 01/10/2021   Procedure: COLONOSCOPY WITH PROPOFOL;  Surgeon: Hilarie Fredrickson,  MD;  Location: [facility];  Service: Endoscopy;  Laterality: N/A;   COLOSTOMY N/A 01/15/2021   Procedure: COLOSTOMY;  Surgeon: Harriette Bouillon, MD;  Location: [facility] ORS;  Service: General;  Laterality: N/A;   ESOPHAGOGASTRODUODENOSCOPY (EGD) WITH PROPOFOL N/A 01/14/2021   Procedure: ESOPHAGOGASTRODUODENOSCOPY (EGD) WITH PROPOFOL;  Surgeon: Sherrilyn Rist, MD;  Location: [facility] END[facility];  Service: Endoscopy;  Laterality: N/A;   FIDUCIAL MARKER PLACEMENT  02/14/2023   Procedure: FIDUCIAL MARKER  PLACEMENT;  Surgeon: Leslye Peer, MD;  Location: [facility] ENDOSCOPY;  Service: Pulmonary;;   FLEXIBLE SIGMOIDOSCOPY N/A 01/14/2021   Procedure: Arnell Sieving;  Surgeon: Sherrilyn Rist, MD;  Location: [facility];  Service: Endoscopy;  Laterality: N/A;   HIP ARTHROPLASTY Left 08/31/2023   Procedure: ARTHROPLASTY BIPOLAR HIP (HEMIARTHROPLASTY);  Surgeon: Oliver Barre, MD;  Location: [facility];  Service: Orthopedics;  Laterality: Left;   HOT HEMOSTASIS N/A 01/10/2021   Procedure: HOT HEMOSTASIS (ARGON PLASMA COAGULATION/BICAP);  Surgeon: Hilarie Fredrickson, MD;  Location: [facility] ENDOSCOPY;  Service: Endoscopy;  Laterality: N/A;   IR ANGIOGRAM SELECTIVE EACH ADDITIONAL VESSEL  01/12/2021   IR ANGIOGRAM SELECTIVE EACH ADDITIONAL VESSEL  01/12/2021   IR ANGIOGRAM SELECTIVE EACH ADDITIONAL VESSEL  01/12/2021   IR ANGIOGRAM VISCERAL SELECTIVE  01/09/2021   IR ANGIOGRAM VISCERAL SELECTIVE  01/12/2021   IR EMBO ARTERIAL NOT HEMORR HEMANG INC GUIDE ROADMAPPING  01/12/2021   IR US GUIDE VASC ACCESS RIGHT  01/09/2021   IR US GUIDE VASC ACCESS RIGHT  01/12/2021   JOINT REPLACEMENT     PARTIAL COLECTOMY N/A 01/15/2021   Procedure: PARTIAL COLECTOMY;  Surgeon: Harriette Bouillon, MD;  Location: [facility];  Service: General;  Laterality: N/A;   POLYPECTOMY  01/10/2021   Procedure: POLYPECTOMY;  Surgeon: Hilarie Fredrickson, MD;  Location: [facility] ENDOSCOPY;  Service: Endoscopy;;   SKIN CANCER EXCISION Right 12/2022   Right Leg above knee   TONSILLECTOMY     VIDEO BRONCHOSCOPY WITH ENDOBRONCHIAL ULTRASOUND Bilateral 02/14/2023   Procedure: VIDEO BRONCHOSCOPY WITH ENDOBRONCHIAL ULTRASOUND;  Surgeon: Leslye Peer, MD;  Location: [facility] ENDOSCOPY;  Service: Pulmonary;  Laterality: Bilateral;   VIDEO BRONCHOSCOPY WITH RADIAL ENDOBRONCHIAL ULTRASOUND  02/14/2023   Procedure: VIDEO BRONCHOSCOPY WITH RADIAL ENDOBRONCHIAL ULTRASOUND;  Surgeon: Leslye Peer, MD;  Location: [facility] ENDOSCOPY;  Service: Pulmonary;;    Social History    Socioeconomic History   Marital status: Widowed    Spouse name: Not on file   Number of children: 1   Years of education: Not on file   Highest education level: Not on file  Occupational History   Occupation: retired    Associate Professor: RETIRED  Tobacco Use   Smoking status: Every Day    Current packs/day: 1.00    Average packs/day: 1 pack/day for 59.0 years (59.0 ttl pk-yrs)    Types: Cigarettes   Smokeless tobacco: Never   Tobacco comments:    Started smoking at age 82  Vaping Use   Vaping status: Never Used  Substance and Sexual Activity   Alcohol use: No   Drug use: No   Sexual activity: Never  Other Topics Concern   Not on file  Social History Narrative   Lives Alone.  A Nephew checks in on her.    Has an elderly dog that is precious to patient.    Daughter died 09/14/21   (+) CaneSmoke less than pack per day since age 51   Social Determinants of Health   Financial Resource Strain: Not on  file  Food Insecurity: No Food Insecurity (08/30/2023)   Hunger Vital Sign    Worried About Running Out of Food in the Last Year: Never true    Ran Out of Food in the Last Year: Never true  Transportation Needs: No Transportation Needs (08/30/2023)   PRAPARE - Administrator, Civil Service (Medical): No    Lack of Transportation (Non-Medical): No  Physical Activity: Not on file  Stress: Not on file  Social Connections: Not on file  Intimate Partner Violence: Not At Risk (08/30/2023)   Humiliation, Afraid, Rape, and Kick questionnaire    Fear of Current or Ex-Partner: No    Emotionally Abused: No    Physically Abused: No    Sexually Abused: No   Family History  Problem Relation Age of Onset   Breast cancer Mother    Lung cancer Father    Breast cancer Sister    Anuerysm Brother        in stomach   Colon cancer Neg Hx    Esophageal cancer Neg Hx    Rectal cancer Neg Hx       VITAL SIGNS BP 116/68   Pulse 78   Temp (!) 97.3 F (36.3 C)   Resp 20   Ht 5'  1" (1.549 m)   Wt 93 lb 9.6 oz (42.5 kg)   SpO2 93%   BMI 17.69 kg/m   Outpatient Encounter Medications as of 09/06/2023  Medication Sig Note   acetaminophen (TYLENOL) 500 MG tablet Take 2 tablets (1,000 mg total) by mouth every 6 (six) hours as needed for moderate pain or mild pain.    cefdinir (OMNICEF) 300 MG capsule Take 1 capsule (300 mg total) by mouth every 12 (twelve) hours. Last dose on 09/06/23 PM 09/05/2023: End 09/06/23   doxycycline (VIBRA-TABS) 100 MG tablet Take 1 tablet (100 mg total) by mouth every 12 (twelve) hours. Last dose on 09/06/23 PM 09/05/2023: End 09/06/23   ECHINACEA PO Take 167 mg by mouth daily.    Ferrous Sulfate 27 MG TABS Take 54 mg by mouth daily with breakfast.    fluticasone-salmeterol (ADVAIR) 100-50 MCG/ACT AEPB Inhale 1 puff into the lungs 2 (two) times daily.    HYDROcodone-acetaminophen (NORCO/VICODIN) 5-325 MG tablet Take 1 tablet by mouth at bedtime.    metoprolol tartrate (LOPRESSOR) 25 MG tablet Take 0.5 tablets (12.5 mg total) by mouth 2 (two) times daily.    Multiple Vitamins-Minerals (MULTIVITAMIN WITH MINERALS) tablet Take 1 tablet by mouth daily. Centrum Silver    Omega-3 Fatty Acids (FISH OIL) 1000 MG CAPS Take 1,000 Units by mouth daily.    OXYGEN Inhale 2 L into the lungs.    Potassium 99 MG TABS Take by mouth.    pravastatin (PRAVACHOL) 10 MG tablet Take 10 mg by mouth daily.    No facility-administered encounter medications on file as of 09/06/2023.     SIGNIFICANT DIAGNOSTIC EXAMS  TODAY  08-30-23: wbc 14.6; hgb 12.4; hct 35.5; mcv 96.2 plt 321; glucose 131; bun 11; creat 0.75; k+ 3.2; na++ 134; ca 9.0; gfr >60; mag 1.9 blood culture: no growth 09-02-23: wbc 11.6; hgb 9.9; hct 29.8; mcv 97.7 plt 243; glucose 144; bun 15; creat 0.62; k+ 3.6; na++ 129; ca 7.9; gfr >60 09-03-23: glucose 97; bun 16; creat 0.67; k+ 3.5; na++ 132; ca 8.0; gfr >60 mag 1.9   Review of Systems  Constitutional:  Negative for malaise/fatigue.   Respiratory:  Negative for cough and shortness of  breath.   Cardiovascular:  Negative for chest pain, palpitations and leg swelling.  Gastrointestinal:  Negative for abdominal pain, constipation and heartburn.  Musculoskeletal:  Negative for back pain, joint pain and myalgias.  Skin: Negative.   Neurological:  Negative for dizziness.  Psychiatric/Behavioral:  The patient is not nervous/anxious.    Physical Exam Constitutional:      General: She is not in acute distress.    Appearance: She is well-developed. She is not diaphoretic.  Neck:     Thyroid: No thyromegaly.  Cardiovascular:     Rate and Rhythm: Normal rate and regular rhythm.     Pulses: Normal pulses.     Heart sounds: Normal heart sounds.  Pulmonary:     Effort: Pulmonary effort is normal. No respiratory distress.     Breath sounds: Normal breath sounds.  Abdominal:     General: Bowel sounds are normal. There is no distension.     Palpations: Abdomen is soft.     Tenderness: There is no abdominal tenderness.     Comments: Right upper quad ileostomy    Musculoskeletal:        General: Normal range of motion.     Cervical back: Neck supple.     Right lower leg: No edema.     Left lower leg: No edema.     Comments: Status post left hip hemiarthroplasty    Lymphadenopathy:     Cervical: No cervical adenopathy.  Skin:    General: Skin is warm and dry.  Neurological:     Mental Status: She is alert and oriented to person, place, and time.  Psychiatric:        Mood and Affect: Mood normal.     ASSESSMENT/ PLAN:   Patient is being discharged with the following home health services:  pt/ot to evaluate and treat as indicated for gait balance strength adl training   Patient is being discharged with the following durable medical equipment:  may require use of 02 if unable to wean off.   Patient has been advised to f/u with their PCP in 1-2 weeks to for a transitions of care visit.  Social services at their facility  was responsible for arranging this appointment.  Pt was provided with adequate prescriptions of noncontrolled medications to reach the scheduled appointment .  For controlled substances, a limited supply was provided as appropriate for the individual patient.  If the pt normally receives these medications from a pain clinic or has a contract with another physician, these medications should be received from that clinic or physician only).    A 30 day supply of her prescription medications have been sent to: [place] [facility]  Time spent with patient: 35 minutes: medications; home health; dme.   Synthia Innocent NP [facility]  call [phone]

## 2023-09-10 DIAGNOSIS — J449 Chronic obstructive pulmonary disease, unspecified: Secondary | ICD-10-CM | POA: Diagnosis not present

## 2023-09-10 DIAGNOSIS — F17210 Nicotine dependence, cigarettes, uncomplicated: Secondary | ICD-10-CM | POA: Diagnosis not present

## 2023-09-10 DIAGNOSIS — Z85118 Personal history of other malignant neoplasm of bronchus and lung: Secondary | ICD-10-CM | POA: Diagnosis not present

## 2023-09-10 DIAGNOSIS — Z9181 History of falling: Secondary | ICD-10-CM | POA: Diagnosis not present

## 2023-09-10 DIAGNOSIS — Z79899 Other long term (current) drug therapy: Secondary | ICD-10-CM | POA: Diagnosis not present

## 2023-09-10 DIAGNOSIS — D649 Anemia, unspecified: Secondary | ICD-10-CM | POA: Diagnosis not present

## 2023-09-10 DIAGNOSIS — I10 Essential (primary) hypertension: Secondary | ICD-10-CM | POA: Diagnosis not present

## 2023-09-10 DIAGNOSIS — Z7982 Long term (current) use of aspirin: Secondary | ICD-10-CM | POA: Diagnosis not present

## 2023-09-10 DIAGNOSIS — S7292XD Unspecified fracture of left femur, subsequent encounter for closed fracture with routine healing: Secondary | ICD-10-CM | POA: Diagnosis not present

## 2023-09-10 DIAGNOSIS — Z96642 Presence of left artificial hip joint: Secondary | ICD-10-CM | POA: Diagnosis not present

## 2023-09-10 DIAGNOSIS — Z433 Encounter for attention to colostomy: Secondary | ICD-10-CM | POA: Diagnosis not present

## 2023-09-13 DIAGNOSIS — F17210 Nicotine dependence, cigarettes, uncomplicated: Secondary | ICD-10-CM | POA: Diagnosis not present

## 2023-09-13 DIAGNOSIS — Z79899 Other long term (current) drug therapy: Secondary | ICD-10-CM | POA: Diagnosis not present

## 2023-09-13 DIAGNOSIS — Z9181 History of falling: Secondary | ICD-10-CM | POA: Diagnosis not present

## 2023-09-13 DIAGNOSIS — Z433 Encounter for attention to colostomy: Secondary | ICD-10-CM | POA: Diagnosis not present

## 2023-09-13 DIAGNOSIS — Z96642 Presence of left artificial hip joint: Secondary | ICD-10-CM | POA: Diagnosis not present

## 2023-09-13 DIAGNOSIS — I10 Essential (primary) hypertension: Secondary | ICD-10-CM | POA: Diagnosis not present

## 2023-09-13 DIAGNOSIS — Z7982 Long term (current) use of aspirin: Secondary | ICD-10-CM | POA: Diagnosis not present

## 2023-09-13 DIAGNOSIS — D649 Anemia, unspecified: Secondary | ICD-10-CM | POA: Diagnosis not present

## 2023-09-13 DIAGNOSIS — J449 Chronic obstructive pulmonary disease, unspecified: Secondary | ICD-10-CM | POA: Diagnosis not present

## 2023-09-13 DIAGNOSIS — S7292XD Unspecified fracture of left femur, subsequent encounter for closed fracture with routine healing: Secondary | ICD-10-CM | POA: Diagnosis not present

## 2023-09-13 DIAGNOSIS — Z85118 Personal history of other malignant neoplasm of bronchus and lung: Secondary | ICD-10-CM | POA: Diagnosis not present

## 2023-09-14 ENCOUNTER — Encounter: Payer: Self-pay | Admitting: Orthopedic Surgery

## 2023-09-14 ENCOUNTER — Ambulatory Visit (INDEPENDENT_AMBULATORY_CARE_PROVIDER_SITE_OTHER): Payer: Medicare Other | Admitting: Orthopedic Surgery

## 2023-09-14 ENCOUNTER — Other Ambulatory Visit (INDEPENDENT_AMBULATORY_CARE_PROVIDER_SITE_OTHER): Payer: Medicare Other

## 2023-09-14 VITALS — Ht 61.0 in | Wt 95.0 lb

## 2023-09-14 DIAGNOSIS — I10 Essential (primary) hypertension: Secondary | ICD-10-CM | POA: Diagnosis not present

## 2023-09-14 DIAGNOSIS — Z79899 Other long term (current) drug therapy: Secondary | ICD-10-CM | POA: Diagnosis not present

## 2023-09-14 DIAGNOSIS — Z7982 Long term (current) use of aspirin: Secondary | ICD-10-CM | POA: Diagnosis not present

## 2023-09-14 DIAGNOSIS — Z96642 Presence of left artificial hip joint: Secondary | ICD-10-CM | POA: Diagnosis not present

## 2023-09-14 DIAGNOSIS — F17210 Nicotine dependence, cigarettes, uncomplicated: Secondary | ICD-10-CM | POA: Diagnosis not present

## 2023-09-14 DIAGNOSIS — Z96649 Presence of unspecified artificial hip joint: Secondary | ICD-10-CM

## 2023-09-14 DIAGNOSIS — S7292XD Unspecified fracture of left femur, subsequent encounter for closed fracture with routine healing: Secondary | ICD-10-CM | POA: Diagnosis not present

## 2023-09-14 DIAGNOSIS — J449 Chronic obstructive pulmonary disease, unspecified: Secondary | ICD-10-CM | POA: Diagnosis not present

## 2023-09-14 DIAGNOSIS — Z9181 History of falling: Secondary | ICD-10-CM | POA: Diagnosis not present

## 2023-09-14 DIAGNOSIS — D649 Anemia, unspecified: Secondary | ICD-10-CM | POA: Diagnosis not present

## 2023-09-14 DIAGNOSIS — Z433 Encounter for attention to colostomy: Secondary | ICD-10-CM | POA: Diagnosis not present

## 2023-09-14 DIAGNOSIS — Z85118 Personal history of other malignant neoplasm of bronchus and lung: Secondary | ICD-10-CM | POA: Diagnosis not present

## 2023-09-14 NOTE — Progress Notes (Signed)
Orthopaedic Postop Note  Assessment: Kathleen Pope is a 89 y.o. female s/p Left hip hemiarthroplasty  DOS: 08/31/2023  Plan: Kathleen Pope removed, steri strips placed Continue using hip abduction pillow while in bed until 6 weeks after surgery Continue posterior hip precautions until 3 months postop Continue with DVT prophylaxis for at least 6 weeks after surgery WBAT on the operative extremity Follow up in 4 weeks; call with any issues  Continues to have pain in the right groin.  AP pelvis is without obvious injury in the right hip.  This may be muscular.  Continue to monitor.  If she continues to have issues, we may have to consider a CT scan, or an MRI for further evaluation.   Follow-up: Return in about 4 weeks (around 10/12/2023). XR at next visit: AP pelvis and Left hip  Subjective:  Chief Complaint  Patient presents with   Routine Post Op    L hip DOS 08/31/23   Hip Pain    R hip hurting more than left w/ bruising    History of Present Illness: Kathleen Pope is a 89 y.o. female who presents following the above stated procedure.  She is doing very well.  She was discharged to a nursing facility, but is already returned home.  She is ambulating well with the assistance of a walker.  She is not having any pain in the left hip.  She continues to have pain in the right groin.  This is affecting her ability to ambulate.  She is not using a pillow at nighttime, but sleeps with her legs apart.  She does not trust her legs.  Review of Systems: No fevers or chills No numbness or tingling No Chest Pain No shortness of breath   Objective: Ht 5\' 1"  (1.549 m)   Wt 95 lb (43.1 kg)   BMI 17.95 kg/m   Physical Exam:  Elderly female.  No acute distress.  Stable gait with walker.  Surgical incision is healing well.  No surrounding erythema or drainage.  There is some surrounding skin irritation.  No tenderness to palpation.  She tolerates gentle range of motion of the left hip.   She is able to maintain straight leg raise.  Active motion intact in the EHL/TA.  IMAGING: I personally ordered and reviewed the following images:  XR of the Left hip and AP pelvis demonstrates a pressfit hip hemiarthroplasty in good position.  The hip remains reduced.  There is no evidence of implant subsidence.  No acute fractures are noted.  AP view of the right hip is without acute injury.  No dislocation.  Impression: Left hip hemiarthroplasty in stable position, without evidence of migration or subsidence compared to prior XR   Oliver Barre, MD 09/14/2023 2:21 PM

## 2023-09-15 DIAGNOSIS — Z7982 Long term (current) use of aspirin: Secondary | ICD-10-CM | POA: Diagnosis not present

## 2023-09-15 DIAGNOSIS — Z9181 History of falling: Secondary | ICD-10-CM | POA: Diagnosis not present

## 2023-09-15 DIAGNOSIS — Z96642 Presence of left artificial hip joint: Secondary | ICD-10-CM | POA: Diagnosis not present

## 2023-09-15 DIAGNOSIS — D649 Anemia, unspecified: Secondary | ICD-10-CM | POA: Diagnosis not present

## 2023-09-15 DIAGNOSIS — J449 Chronic obstructive pulmonary disease, unspecified: Secondary | ICD-10-CM | POA: Diagnosis not present

## 2023-09-15 DIAGNOSIS — Z433 Encounter for attention to colostomy: Secondary | ICD-10-CM | POA: Diagnosis not present

## 2023-09-15 DIAGNOSIS — Z79899 Other long term (current) drug therapy: Secondary | ICD-10-CM | POA: Diagnosis not present

## 2023-09-15 DIAGNOSIS — I10 Essential (primary) hypertension: Secondary | ICD-10-CM | POA: Diagnosis not present

## 2023-09-15 DIAGNOSIS — Z85118 Personal history of other malignant neoplasm of bronchus and lung: Secondary | ICD-10-CM | POA: Diagnosis not present

## 2023-09-15 DIAGNOSIS — F17210 Nicotine dependence, cigarettes, uncomplicated: Secondary | ICD-10-CM | POA: Diagnosis not present

## 2023-09-15 DIAGNOSIS — S7292XD Unspecified fracture of left femur, subsequent encounter for closed fracture with routine healing: Secondary | ICD-10-CM | POA: Diagnosis not present

## 2023-09-16 DIAGNOSIS — Z85118 Personal history of other malignant neoplasm of bronchus and lung: Secondary | ICD-10-CM | POA: Diagnosis not present

## 2023-09-16 DIAGNOSIS — Z79899 Other long term (current) drug therapy: Secondary | ICD-10-CM | POA: Diagnosis not present

## 2023-09-16 DIAGNOSIS — S7292XD Unspecified fracture of left femur, subsequent encounter for closed fracture with routine healing: Secondary | ICD-10-CM | POA: Diagnosis not present

## 2023-09-16 DIAGNOSIS — Z96642 Presence of left artificial hip joint: Secondary | ICD-10-CM | POA: Diagnosis not present

## 2023-09-16 DIAGNOSIS — Z9181 History of falling: Secondary | ICD-10-CM | POA: Diagnosis not present

## 2023-09-16 DIAGNOSIS — Z433 Encounter for attention to colostomy: Secondary | ICD-10-CM | POA: Diagnosis not present

## 2023-09-16 DIAGNOSIS — D649 Anemia, unspecified: Secondary | ICD-10-CM | POA: Diagnosis not present

## 2023-09-16 DIAGNOSIS — Z7982 Long term (current) use of aspirin: Secondary | ICD-10-CM | POA: Diagnosis not present

## 2023-09-16 DIAGNOSIS — J449 Chronic obstructive pulmonary disease, unspecified: Secondary | ICD-10-CM | POA: Diagnosis not present

## 2023-09-16 DIAGNOSIS — F17210 Nicotine dependence, cigarettes, uncomplicated: Secondary | ICD-10-CM | POA: Diagnosis not present

## 2023-09-16 DIAGNOSIS — I10 Essential (primary) hypertension: Secondary | ICD-10-CM | POA: Diagnosis not present

## 2023-09-20 DIAGNOSIS — Z433 Encounter for attention to colostomy: Secondary | ICD-10-CM | POA: Diagnosis not present

## 2023-09-20 DIAGNOSIS — F17210 Nicotine dependence, cigarettes, uncomplicated: Secondary | ICD-10-CM | POA: Diagnosis not present

## 2023-09-20 DIAGNOSIS — Z7982 Long term (current) use of aspirin: Secondary | ICD-10-CM | POA: Diagnosis not present

## 2023-09-20 DIAGNOSIS — D649 Anemia, unspecified: Secondary | ICD-10-CM | POA: Diagnosis not present

## 2023-09-20 DIAGNOSIS — S7292XD Unspecified fracture of left femur, subsequent encounter for closed fracture with routine healing: Secondary | ICD-10-CM | POA: Diagnosis not present

## 2023-09-20 DIAGNOSIS — J449 Chronic obstructive pulmonary disease, unspecified: Secondary | ICD-10-CM | POA: Diagnosis not present

## 2023-09-20 DIAGNOSIS — Z79899 Other long term (current) drug therapy: Secondary | ICD-10-CM | POA: Diagnosis not present

## 2023-09-20 DIAGNOSIS — Z85118 Personal history of other malignant neoplasm of bronchus and lung: Secondary | ICD-10-CM | POA: Diagnosis not present

## 2023-09-20 DIAGNOSIS — Z96642 Presence of left artificial hip joint: Secondary | ICD-10-CM | POA: Diagnosis not present

## 2023-09-20 DIAGNOSIS — I10 Essential (primary) hypertension: Secondary | ICD-10-CM | POA: Diagnosis not present

## 2023-09-20 DIAGNOSIS — Z9181 History of falling: Secondary | ICD-10-CM | POA: Diagnosis not present

## 2023-09-21 DIAGNOSIS — Z96642 Presence of left artificial hip joint: Secondary | ICD-10-CM | POA: Diagnosis not present

## 2023-09-21 DIAGNOSIS — I10 Essential (primary) hypertension: Secondary | ICD-10-CM | POA: Diagnosis not present

## 2023-09-21 DIAGNOSIS — D649 Anemia, unspecified: Secondary | ICD-10-CM | POA: Diagnosis not present

## 2023-09-21 DIAGNOSIS — J449 Chronic obstructive pulmonary disease, unspecified: Secondary | ICD-10-CM | POA: Diagnosis not present

## 2023-09-21 DIAGNOSIS — Z7982 Long term (current) use of aspirin: Secondary | ICD-10-CM | POA: Diagnosis not present

## 2023-09-21 DIAGNOSIS — Z79899 Other long term (current) drug therapy: Secondary | ICD-10-CM | POA: Diagnosis not present

## 2023-09-21 DIAGNOSIS — Z9181 History of falling: Secondary | ICD-10-CM | POA: Diagnosis not present

## 2023-09-21 DIAGNOSIS — Z85118 Personal history of other malignant neoplasm of bronchus and lung: Secondary | ICD-10-CM | POA: Diagnosis not present

## 2023-09-21 DIAGNOSIS — S7292XD Unspecified fracture of left femur, subsequent encounter for closed fracture with routine healing: Secondary | ICD-10-CM | POA: Diagnosis not present

## 2023-09-21 DIAGNOSIS — F17210 Nicotine dependence, cigarettes, uncomplicated: Secondary | ICD-10-CM | POA: Diagnosis not present

## 2023-09-21 DIAGNOSIS — Z433 Encounter for attention to colostomy: Secondary | ICD-10-CM | POA: Diagnosis not present

## 2023-09-22 DIAGNOSIS — D649 Anemia, unspecified: Secondary | ICD-10-CM | POA: Diagnosis not present

## 2023-09-22 DIAGNOSIS — F17210 Nicotine dependence, cigarettes, uncomplicated: Secondary | ICD-10-CM | POA: Diagnosis not present

## 2023-09-22 DIAGNOSIS — Z433 Encounter for attention to colostomy: Secondary | ICD-10-CM | POA: Diagnosis not present

## 2023-09-22 DIAGNOSIS — J449 Chronic obstructive pulmonary disease, unspecified: Secondary | ICD-10-CM | POA: Diagnosis not present

## 2023-09-22 DIAGNOSIS — I10 Essential (primary) hypertension: Secondary | ICD-10-CM | POA: Diagnosis not present

## 2023-09-22 DIAGNOSIS — Z7982 Long term (current) use of aspirin: Secondary | ICD-10-CM | POA: Diagnosis not present

## 2023-09-22 DIAGNOSIS — Z79899 Other long term (current) drug therapy: Secondary | ICD-10-CM | POA: Diagnosis not present

## 2023-09-22 DIAGNOSIS — S7292XD Unspecified fracture of left femur, subsequent encounter for closed fracture with routine healing: Secondary | ICD-10-CM | POA: Diagnosis not present

## 2023-09-22 DIAGNOSIS — Z85118 Personal history of other malignant neoplasm of bronchus and lung: Secondary | ICD-10-CM | POA: Diagnosis not present

## 2023-09-22 DIAGNOSIS — Z96642 Presence of left artificial hip joint: Secondary | ICD-10-CM | POA: Diagnosis not present

## 2023-09-22 DIAGNOSIS — Z9181 History of falling: Secondary | ICD-10-CM | POA: Diagnosis not present

## 2023-09-26 ENCOUNTER — Telehealth: Payer: Self-pay | Admitting: Orthopedic Surgery

## 2023-09-26 DIAGNOSIS — Z433 Encounter for attention to colostomy: Secondary | ICD-10-CM | POA: Diagnosis not present

## 2023-09-26 DIAGNOSIS — Z79899 Other long term (current) drug therapy: Secondary | ICD-10-CM | POA: Diagnosis not present

## 2023-09-26 DIAGNOSIS — Z7982 Long term (current) use of aspirin: Secondary | ICD-10-CM | POA: Diagnosis not present

## 2023-09-26 DIAGNOSIS — S7292XD Unspecified fracture of left femur, subsequent encounter for closed fracture with routine healing: Secondary | ICD-10-CM | POA: Diagnosis not present

## 2023-09-26 DIAGNOSIS — J449 Chronic obstructive pulmonary disease, unspecified: Secondary | ICD-10-CM | POA: Diagnosis not present

## 2023-09-26 DIAGNOSIS — F17210 Nicotine dependence, cigarettes, uncomplicated: Secondary | ICD-10-CM | POA: Diagnosis not present

## 2023-09-26 DIAGNOSIS — Z96642 Presence of left artificial hip joint: Secondary | ICD-10-CM | POA: Diagnosis not present

## 2023-09-26 DIAGNOSIS — Z9181 History of falling: Secondary | ICD-10-CM | POA: Diagnosis not present

## 2023-09-26 DIAGNOSIS — Z85118 Personal history of other malignant neoplasm of bronchus and lung: Secondary | ICD-10-CM | POA: Diagnosis not present

## 2023-09-26 DIAGNOSIS — I10 Essential (primary) hypertension: Secondary | ICD-10-CM | POA: Diagnosis not present

## 2023-09-26 DIAGNOSIS — D649 Anemia, unspecified: Secondary | ICD-10-CM | POA: Diagnosis not present

## 2023-09-26 NOTE — Telephone Encounter (Signed)
Dr. Dallas Schimke pt - pt called, stated she is having right hip pain, says it's causing her not to sleep.  She doesn't want an appointment.  She is requesting pain medication to be sent to [facility].

## 2023-09-27 ENCOUNTER — Other Ambulatory Visit: Payer: Self-pay

## 2023-09-27 ENCOUNTER — Encounter (HOSPITAL_COMMUNITY): Payer: Self-pay | Admitting: Emergency Medicine

## 2023-09-27 ENCOUNTER — Emergency Department (HOSPITAL_COMMUNITY): Payer: Medicare Other

## 2023-09-27 ENCOUNTER — Inpatient Hospital Stay (HOSPITAL_COMMUNITY)
Admission: EM | Admit: 2023-09-27 | Discharge: 2023-09-30 | DRG: 291 | Disposition: A | Discharge status: Home Health | Payer: Medicare Other | Attending: Family Medicine | Admitting: Family Medicine

## 2023-09-27 DIAGNOSIS — D509 Iron deficiency anemia, unspecified: Secondary | ICD-10-CM | POA: Diagnosis not present

## 2023-09-27 DIAGNOSIS — J9601 Acute respiratory failure with hypoxia: Secondary | ICD-10-CM | POA: Diagnosis present

## 2023-09-27 DIAGNOSIS — Z801 Family history of malignant neoplasm of trachea, bronchus and lung: Secondary | ICD-10-CM | POA: Diagnosis not present

## 2023-09-27 DIAGNOSIS — Z932 Ileostomy status: Secondary | ICD-10-CM | POA: Diagnosis not present

## 2023-09-27 DIAGNOSIS — E43 Unspecified severe protein-calorie malnutrition: Secondary | ICD-10-CM | POA: Diagnosis present

## 2023-09-27 DIAGNOSIS — R0602 Shortness of breath: Secondary | ICD-10-CM | POA: Diagnosis not present

## 2023-09-27 DIAGNOSIS — J918 Pleural effusion in other conditions classified elsewhere: Secondary | ICD-10-CM | POA: Diagnosis present

## 2023-09-27 DIAGNOSIS — E876 Hypokalemia: Secondary | ICD-10-CM

## 2023-09-27 DIAGNOSIS — Z85828 Personal history of other malignant neoplasm of skin: Secondary | ICD-10-CM | POA: Diagnosis not present

## 2023-09-27 DIAGNOSIS — I509 Heart failure, unspecified: Secondary | ICD-10-CM | POA: Diagnosis not present

## 2023-09-27 DIAGNOSIS — C3491 Malignant neoplasm of unspecified part of right bronchus or lung: Secondary | ICD-10-CM | POA: Diagnosis present

## 2023-09-27 DIAGNOSIS — R0989 Other specified symptoms and signs involving the circulatory and respiratory systems: Secondary | ICD-10-CM | POA: Diagnosis not present

## 2023-09-27 DIAGNOSIS — I700 Atherosclerosis of aorta: Secondary | ICD-10-CM | POA: Diagnosis not present

## 2023-09-27 DIAGNOSIS — Z803 Family history of malignant neoplasm of breast: Secondary | ICD-10-CM | POA: Diagnosis not present

## 2023-09-27 DIAGNOSIS — Z433 Encounter for attention to colostomy: Secondary | ICD-10-CM | POA: Diagnosis not present

## 2023-09-27 DIAGNOSIS — I5031 Acute diastolic (congestive) heart failure: Secondary | ICD-10-CM | POA: Diagnosis present

## 2023-09-27 DIAGNOSIS — R54 Age-related physical debility: Secondary | ICD-10-CM | POA: Diagnosis present

## 2023-09-27 DIAGNOSIS — F17210 Nicotine dependence, cigarettes, uncomplicated: Secondary | ICD-10-CM | POA: Diagnosis present

## 2023-09-27 DIAGNOSIS — Z923 Personal history of irradiation: Secondary | ICD-10-CM | POA: Diagnosis not present

## 2023-09-27 DIAGNOSIS — Z8781 Personal history of (healed) traumatic fracture: Secondary | ICD-10-CM

## 2023-09-27 DIAGNOSIS — Z48813 Encounter for surgical aftercare following surgery on the respiratory system: Secondary | ICD-10-CM | POA: Diagnosis not present

## 2023-09-27 DIAGNOSIS — I272 Pulmonary hypertension, unspecified: Secondary | ICD-10-CM | POA: Diagnosis present

## 2023-09-27 DIAGNOSIS — Z681 Body mass index (BMI) 19 or less, adult: Secondary | ICD-10-CM

## 2023-09-27 DIAGNOSIS — I10 Essential (primary) hypertension: Secondary | ICD-10-CM | POA: Diagnosis present

## 2023-09-27 DIAGNOSIS — Z96642 Presence of left artificial hip joint: Secondary | ICD-10-CM | POA: Diagnosis not present

## 2023-09-27 DIAGNOSIS — R102 Pelvic and perineal pain: Secondary | ICD-10-CM | POA: Diagnosis not present

## 2023-09-27 DIAGNOSIS — I110 Hypertensive heart disease with heart failure: Secondary | ICD-10-CM | POA: Diagnosis not present

## 2023-09-27 DIAGNOSIS — Z7951 Long term (current) use of inhaled steroids: Secondary | ICD-10-CM | POA: Diagnosis not present

## 2023-09-27 DIAGNOSIS — R64 Cachexia: Secondary | ICD-10-CM | POA: Diagnosis not present

## 2023-09-27 DIAGNOSIS — S7292XD Unspecified fracture of left femur, subsequent encounter for closed fracture with routine healing: Secondary | ICD-10-CM | POA: Diagnosis not present

## 2023-09-27 DIAGNOSIS — J449 Chronic obstructive pulmonary disease, unspecified: Secondary | ICD-10-CM | POA: Diagnosis not present

## 2023-09-27 DIAGNOSIS — R0603 Acute respiratory distress: Principal | ICD-10-CM

## 2023-09-27 DIAGNOSIS — R7989 Other specified abnormal findings of blood chemistry: Secondary | ICD-10-CM | POA: Diagnosis not present

## 2023-09-27 DIAGNOSIS — J90 Pleural effusion, not elsewhere classified: Secondary | ICD-10-CM | POA: Diagnosis present

## 2023-09-27 DIAGNOSIS — R918 Other nonspecific abnormal finding of lung field: Secondary | ICD-10-CM | POA: Diagnosis not present

## 2023-09-27 DIAGNOSIS — E782 Mixed hyperlipidemia: Secondary | ICD-10-CM | POA: Diagnosis not present

## 2023-09-27 DIAGNOSIS — Z85118 Personal history of other malignant neoplasm of bronchus and lung: Secondary | ICD-10-CM | POA: Diagnosis not present

## 2023-09-27 DIAGNOSIS — R011 Cardiac murmur, unspecified: Secondary | ICD-10-CM | POA: Diagnosis present

## 2023-09-27 DIAGNOSIS — Z66 Do not resuscitate: Secondary | ICD-10-CM | POA: Diagnosis not present

## 2023-09-27 DIAGNOSIS — Z79899 Other long term (current) drug therapy: Secondary | ICD-10-CM | POA: Diagnosis not present

## 2023-09-27 DIAGNOSIS — E871 Hypo-osmolality and hyponatremia: Secondary | ICD-10-CM

## 2023-09-27 LAB — CBC WITH DIFFERENTIAL/PLATELET
Abs Immature Granulocytes: 0.03 10*3/uL (ref 0.00–0.07)
Basophils Absolute: 0 10*3/uL (ref 0.0–0.1)
Basophils Relative: 0 %
Eosinophils Absolute: 0.2 10*3/uL (ref 0.0–0.5)
Eosinophils Relative: 2 %
HCT: 30 % — ABNORMAL LOW (ref 36.0–46.0)
Hemoglobin: 10.4 g/dL — ABNORMAL LOW (ref 12.0–15.0)
Immature Granulocytes: 0 %
Lymphocytes Relative: 10 %
Lymphs Abs: 0.9 10*3/uL (ref 0.7–4.0)
MCH: 31.9 pg (ref 26.0–34.0)
MCHC: 34.7 g/dL (ref 30.0–36.0)
MCV: 92 fL (ref 80.0–100.0)
Monocytes Absolute: 0.8 10*3/uL (ref 0.1–1.0)
Monocytes Relative: 9 %
Neutro Abs: 7.3 10*3/uL (ref 1.7–7.7)
Neutrophils Relative %: 79 %
Platelets: 514 10*3/uL — ABNORMAL HIGH (ref 150–400)
RBC: 3.26 MIL/uL — ABNORMAL LOW (ref 3.87–5.11)
RDW: 15.6 % — ABNORMAL HIGH (ref 11.5–15.5)
WBC: 9.2 10*3/uL (ref 4.0–10.5)
nRBC: 0 % (ref 0.0–0.2)

## 2023-09-27 LAB — COMPREHENSIVE METABOLIC PANEL
ALT: 14 U/L (ref 0–44)
AST: 27 U/L (ref 15–41)
Albumin: 3.5 g/dL (ref 3.5–5.0)
Alkaline Phosphatase: 180 U/L — ABNORMAL HIGH (ref 38–126)
Anion gap: 14 (ref 5–15)
BUN: 14 mg/dL (ref 8–23)
CO2: 24 mmol/L (ref 22–32)
Calcium: 8.7 mg/dL — ABNORMAL LOW (ref 8.9–10.3)
Chloride: 86 mmol/L — ABNORMAL LOW (ref 98–111)
Creatinine, Ser: 0.68 mg/dL (ref 0.44–1.00)
GFR, Estimated: >60 mL/min (ref >60)
Glucose, Bld: 136 mg/dL — ABNORMAL HIGH (ref 70–99)
Potassium: 2.8 mmol/L — ABNORMAL LOW (ref 3.5–5.1)
Sodium: 124 mmol/L — ABNORMAL LOW (ref 135–145)
Total Bilirubin: 0.7 mg/dL (ref <1.2)
Total Protein: 7.2 g/dL (ref 6.5–8.1)

## 2023-09-27 LAB — OSMOLALITY: Osmolality: 262 mosm/kg — ABNORMAL LOW (ref 275–295)

## 2023-09-27 LAB — BRAIN NATRIURETIC PEPTIDE: B Natriuretic Peptide: 743 pg/mL — ABNORMAL HIGH (ref 0.0–100.0)

## 2023-09-27 LAB — NA AND K (SODIUM & POTASSIUM), RAND UR
Potassium Urine: 13 mmol/L
Sodium, Ur: 80 mmol/L

## 2023-09-27 LAB — TROPONIN I (HIGH SENSITIVITY): Troponin I (High Sensitivity): 12 ng/L (ref <18)

## 2023-09-27 LAB — OSMOLALITY, URINE: Osmolality, Ur: 248 mosm/kg — ABNORMAL LOW (ref 300–900)

## 2023-09-27 MED ORDER — BISACODYL 5 MG PO TBEC: 5.0000 mg | DELAYED_RELEASE_TABLET | Freq: Every day | ORAL | Status: DC | PRN

## 2023-09-27 MED ORDER — ACETAMINOPHEN 650 MG RE SUPP: 650.0000 mg | Freq: Four times a day (QID) | RECTAL | Status: DC | PRN

## 2023-09-27 MED ORDER — ACETAMINOPHEN 325 MG PO TABS: 650.0000 mg | ORAL_TABLET | Freq: Four times a day (QID) | ORAL | Status: DC | PRN

## 2023-09-27 MED ORDER — SODIUM CHLORIDE 1 G PO TABS
1.0000 g | ORAL_TABLET | Freq: Once | ORAL | Status: DC
  Filled 2023-09-27: qty 1

## 2023-09-27 MED ORDER — OXYCODONE HCL 5 MG PO TABS
2.5000 mg | ORAL_TABLET | ORAL | Status: DC | PRN
  Administered 2023-09-27 – 2023-09-29 (×4): 2.5 mg via ORAL
  Filled 2023-09-27 (×4): qty 1

## 2023-09-27 MED ORDER — NICOTINE 14 MG/24HR TD PT24
14.0000 mg | MEDICATED_PATCH | Freq: Every day | TRANSDERMAL | Status: DC
  Administered 2023-09-28 – 2023-09-30 (×3): 14 mg via TRANSDERMAL
  Filled 2023-09-27 (×4): qty 1

## 2023-09-27 MED ORDER — IPRATROPIUM-ALBUTEROL 0.5-2.5 (3) MG/3ML IN SOLN
3.0000 mL | Freq: Three times a day (TID) | RESPIRATORY_TRACT | Status: DC
  Administered 2023-09-27 – 2023-09-28 (×3): 3 mL via RESPIRATORY_TRACT
  Filled 2023-09-27 (×3): qty 3

## 2023-09-27 MED ORDER — FUROSEMIDE 10 MG/ML IJ SOLN
80.0000 mg | Freq: Once | INTRAMUSCULAR | Status: AC
Start: 2023-09-27 — End: 2023-09-27
  Administered 2023-09-27: 80 mg via INTRAVENOUS
  Filled 2023-09-27: qty 8

## 2023-09-27 MED ORDER — GUAIFENESIN ER 600 MG PO TB12
600.0000 mg | ORAL_TABLET | Freq: Two times a day (BID) | ORAL | Status: DC
  Administered 2023-09-27 – 2023-09-30 (×7): 600 mg via ORAL
  Filled 2023-09-27 (×7): qty 1

## 2023-09-27 MED ORDER — POTASSIUM CHLORIDE CRYS ER 20 MEQ PO TBCR
40.0000 meq | EXTENDED_RELEASE_TABLET | Freq: Once | ORAL | Status: AC
  Administered 2023-09-27: 40 meq via ORAL
  Filled 2023-09-27: qty 2

## 2023-09-27 MED ORDER — FENTANYL CITRATE PF 50 MCG/ML IJ SOSY: 12.5000 ug | PREFILLED_SYRINGE | INTRAMUSCULAR | Status: DC | PRN

## 2023-09-27 MED ORDER — ONDANSETRON HCL 4 MG PO TABS: 4.0000 mg | ORAL_TABLET | Freq: Four times a day (QID) | ORAL | Status: DC | PRN

## 2023-09-27 MED ORDER — ONDANSETRON HCL 4 MG/2ML IJ SOLN: 4.0000 mg | Freq: Four times a day (QID) | INTRAMUSCULAR | Status: DC | PRN

## 2023-09-27 NOTE — Plan of Care (Signed)
  Problem: Education: Goal: Knowledge of General Education information will improve Description: Including pain rating scale, medication(s)/side effects and non-pharmacologic comfort measures Outcome: Progressing   Problem: Health Behavior/Discharge Planning: Goal: Ability to manage health-related needs will improve Outcome: Progressing   Problem: Clinical Measurements: Goal: Ability to maintain clinical measurements within normal limits will improve Outcome: Progressing   Problem: Elimination: Goal: Will not experience complications related to bowel motility Outcome: Progressing Goal: Will not experience complications related to urinary retention Outcome: Progressing   Problem: Pain Management: Goal: General experience of comfort will improve Outcome: Progressing

## 2023-09-27 NOTE — H&P (Signed)
History and Physical  [facility]  Kathleen Pope GBT:[id number] DOB: Aug 08, 1933 DOA: 09/27/2023  PCP: Elfredia Nevins, MD  Patient coming from: Home  Level of care: Telemetry  I have personally briefly reviewed patient's old medical records in [facility]  Chief Complaint: SOB   HPI: Kathleen Pope is a 89 year old female longtime smoker with COPD but not on oxygen, HFpEF, hypertension, hyperlipidemia, right lung cancer status post radiation treatments, ileostomy, status post left hip arthroplasty 10/16 Dr. Dallas Schimke and other PMH detailed below presented to ED complaining of increasing SOB over the past 3 days.  She reports increasing edema in the legs.  She is having exertional dyspnea symptoms.  She also has orthopnea and has to sit up in a recliner to sleep at night.  She was noted to be hypoxic on arrival with a pulse ox of 83% on room air.  She was placed on 2 L nasal cannula with some improvement.  She reportedly started taking her home as needed Lasix yesterday but had poor urine output.  She denies palpitations and chest pain symptoms.  No chest pressure.  She has chronic cough.  She denies nausea and vomiting.  She says her symptoms feel similar to when she had to have a thoracentesis done several years ago due to a right pleural effusion.  Her cardiac BNP was elevated and greater than 700.  She was noted to have peripheral edema and her chest x-ray showed findings consistent with congestive heart failure and a large left pleural effusion.  IV Lasix 80 mg given in the ED.  Patient noted to have sodium of 124 and a potassium of 2.8.  Blood glucose was 136 and hemoglobin was 10.4.  Hospital admission was requested for further management of acute respiratory failure with hypoxia and Acute HFpEF.      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   Benign neoplasm of ascending colon    Benign neoplasm of cecum    Cavitating mass in right upper lung lobe 01/02/2023   Chronic airway obstruction, not elsewhere classified    Complication of anesthesia    COPD (chronic obstructive pulmonary disease) (HCC)    De Quervain's disease (tenosynovitis) 03/14/2014   Diverticulosis    External hemorrhoids 03/08/2013   GI hemorrhage from post-treatment cecal ulcer 08/18/2012   Heart murmur    systolic with bil rad into carotids   Hemorrhoids    History of radiation therapy    5 cylces stereotactic body radiotherapy   HTN (hypertension)    Hypercholesteremia    Hyponatremia 07/19/2023   Lower GI bleed multiple   AVM's   Lung cancer (HCC) 02/14/2023   Lung mass 02/05/2023   Oxygen deficiency    PONV (postoperative nausea and vomiting)    Pressure injury of skin 07/19/2023   Prolonged QT interval 08/30/2023   Severe sepsis (HCC) 08/30/2023    Past Surgical History:  Procedure Laterality Date   BRONCHIAL BIOPSY  02/14/2023   Procedure: BRONCHIAL BIOPSIES;  Surgeon: Leslye Peer, MD;  Location: [facility] ENDOSCOPY;  Service: Pulmonary;;   BRONCHIAL BRUSHINGS  02/14/2023   Procedure: BRONCHIAL BRUSHINGS;  Surgeon: Leslye Peer, MD;  Location: [facility] ENDOSCOPY;  Service: Pulmonary;;   BRONCHIAL NEEDLE ASPIRATION BIOPSY  02/14/2023   Procedure: BRONCHIAL NEEDLE ASPIRATION  BIOPSIES;  Surgeon: Leslye Peer, MD;  Location: [facility] ENDOSCOPY;  Service: Pulmonary;;   BRONCHIAL WASHINGS  02/14/2023   Procedure: BRONCHIAL WASHINGS;  Surgeon: Leslye Peer, MD;  Location: [facility] ENDOSCOPY;  Service: Pulmonary;;   BUNIONECTOMY     left   COLECTOMY     COLON RESECTION N/A 01/17/2021   Procedure: EXPLORATORY LAPAROTOMY COMPLETION COLECTOMY ileostomy;  Surgeon: Axel Filler, MD;  Location: [facility] ORS;  Service: General;  Laterality: N/A;   COLONOSCOPY  08/10/2012   Procedure: COLONOSCOPY;  Surgeon: Meryl Dare,  MD,FACG;  Location: [facility] ENDOSCOPY;  Service: Endoscopy;  Laterality: N/A;   COLONOSCOPY  08/19/2012   Procedure: COLONOSCOPY;  Surgeon: Iva Boop, MD;  Location: [facility] ENDOSCOPY;  Service: Endoscopy;  Laterality: N/A;   COLONOSCOPY N/A 03/11/2013   Procedure: COLONOSCOPY;  Surgeon: Hilarie Fredrickson, MD;  Location: [facility] ENDOSCOPY;  Service: Endoscopy;  Laterality: N/A;   COLONOSCOPY N/A 01/16/2021   Procedure: COLONOSCOPY;  Surgeon: Sherrilyn Rist, MD;  Location: [facility] ENDOSCOPY;  Service: Gastroenterology;  Laterality: N/A;   COLONOSCOPY WITH PROPOFOL N/A 01/10/2021   Procedure: COLONOSCOPY WITH PROPOFOL;  Surgeon: Hilarie Fredrickson, MD;  Location: [facility] ENDOSCOPY;  Service: Endoscopy;  Laterality: N/A;   COLOSTOMY N/A 01/15/2021   Procedure: COLOSTOMY;  Surgeon: Harriette Bouillon, MD;  Location: [facility] ORS;  Service: General;  Laterality: N/A;   ESOPHAGOGASTRODUODENOSCOPY (EGD) WITH PROPOFOL N/A 01/14/2021   Procedure: ESOPHAGOGASTRODUODENOSCOPY (EGD) WITH PROPOFOL;  Surgeon: Sherrilyn Rist, MD;  Location: [facility] ENDOSCOPY;  Service: Endoscopy;  Laterality: N/A;   FIDUCIAL MARKER PLACEMENT  02/14/2023   Procedure: FIDUCIAL MARKER PLACEMENT;  Surgeon: Leslye Peer, MD;  Location: [facility] ENDOSCOPY;  Service: Pulmonary;;   FLEXIBLE SIGMOIDOSCOPY N/A 01/14/2021   Procedure: Arnell Sieving;  Surgeon: Sherrilyn Rist, MD;  Location: [facility] ENDOSCOPY;  Service: Endoscopy;  Laterality: N/A;   HIP ARTHROPLASTY Left 08/31/2023   Procedure: ARTHROPLASTY BIPOLAR HIP (HEMIARTHROPLASTY);  Surgeon: Oliver Barre, MD;  Location: [facility] ORS;  Service: Orthopedics;  Laterality: Left;   HOT HEMOSTASIS N/A 01/10/2021   Procedure: HOT HEMOSTASIS (ARGON PLASMA COAGULATION/BICAP);  Surgeon: Hilarie Fredrickson, MD;  Location: [facility] ENDOSCOPY;  Service: Endoscopy;  Laterality: N/A;   IR ANGIOGRAM SELECTIVE EACH ADDITIONAL VESSEL  01/12/2021   IR ANGIOGRAM SELECTIVE EACH ADDITIONAL VESSEL  01/12/2021   IR ANGIOGRAM SELECTIVE EACH  ADDITIONAL VESSEL  01/12/2021   IR ANGIOGRAM VISCERAL SELECTIVE  01/09/2021   IR ANGIOGRAM VISCERAL SELECTIVE  01/12/2021   IR EMBO ARTERIAL NOT HEMORR HEMANG INC GUIDE ROADMAPPING  01/12/2021   IR US GUIDE VASC ACCESS RIGHT  01/09/2021   IR US GUIDE VASC ACCESS RIGHT  01/12/2021   JOINT REPLACEMENT     PARTIAL COLECTOMY N/A 01/15/2021   Procedure: PARTIAL COLECTOMY;  Surgeon: Harriette Bouillon, MD;  Location: [facility] ORS;  Service: General;  Laterality: N/A;   POLYPECTOMY  01/10/2021   Procedure: POLYPECTOMY;  Surgeon: Hilarie Fredrickson, MD;  Location: [facility] ENDOSCOPY;  Service: Endoscopy;;   SKIN CANCER EXCISION Right 12/2022   Right Leg above knee   TONSILLECTOMY     VIDEO BRONCHOSCOPY WITH ENDOBRONCHIAL ULTRASOUND Bilateral 02/14/2023   Procedure: VIDEO BRONCHOSCOPY WITH ENDOBRONCHIAL ULTRASOUND;  Surgeon: Leslye Peer, MD;  Location: [facility] ENDOSCOPY;  Service: Pulmonary;  Laterality: Bilateral;   VIDEO BRONCHOSCOPY WITH RADIAL ENDOBRONCHIAL ULTRASOUND  02/14/2023   Procedure: VIDEO BRONCHOSCOPY WITH RADIAL ENDOBRONCHIAL ULTRASOUND;  Surgeon: Leslye Peer, MD;  Location: [facility] ENDOSCOPY;  Service: Pulmonary;;  reports that she has been smoking cigarettes. She has a 59 pack-year smoking history. She has never used smokeless tobacco. She reports that she does not drink alcohol and does not use drugs.  No Known Allergies  Family History  Problem Relation Age of Onset   Breast cancer Mother    Lung cancer Father    Breast cancer Sister    Anuerysm Brother        in stomach   Colon cancer Neg Hx    Esophageal cancer Neg Hx    Rectal cancer Neg Hx     Prior to Admission medications   Medication Sig Start Date End Date Taking? Authorizing Provider  acetaminophen (TYLENOL) 500 MG tablet Take 2 tablets (1,000 mg total) by mouth every 6 (six) hours as needed for moderate pain or mild pain. 02/14/23   Leslye Peer, MD  cefdinir (OMNICEF) 300 MG capsule Take 1 capsule (300 mg total) by mouth  every 12 (twelve) hours. Last dose on 09/06/23 PM Patient not taking: Reported on 09/14/2023 09/03/23   Catarina Hartshorn, MD  doxycycline (VIBRA-TABS) 100 MG tablet Take 1 tablet (100 mg total) by mouth every 12 (twelve) hours. Last dose on 09/06/23 PM Patient not taking: Reported on 09/14/2023 09/03/23   TatOnalee Hua, MD  ECHINACEA PO Take 167 mg by mouth daily.    [provider]  Ferrous Sulfate 27 MG TABS Take 54 mg by mouth daily with breakfast.    [provider]  fluticasone-salmeterol (ADVAIR) 100-50 MCG/ACT AEPB Inhale 1 puff into the lungs 2 (two) times daily. 09/06/23   Sharee Holster, NP  metoprolol tartrate (LOPRESSOR) 25 MG tablet Take 0.5 tablets (12.5 mg total) by mouth 2 (two) times daily. 09/06/23   Sharee Holster, NP  Multiple Vitamins-Minerals (MULTIVITAMIN WITH MINERALS) tablet Take 1 tablet by mouth daily. Centrum Silver    [provider]  Omega-3 Fatty Acids (FISH OIL) 1000 MG CAPS Take 1,000 Units by mouth daily.    [provider]  OXYGEN Inhale 2 L into the lungs.    [provider]  Potassium 99 MG TABS Take 1 tablet (99 mg total) by mouth daily at 2 PM. 09/06/23   Chilton Si, Chong Sicilian, NP  pravastatin (PRAVACHOL) 10 MG tablet Take 1 tablet (10 mg total) by mouth daily. 09/06/23   Sharee Holster, NP    Physical Exam: Vitals:   09/27/23 1057 09/27/23 1059 09/27/23 1105 09/27/23 1121  BP:  119/72  137/73  Pulse:  89    Resp:  (!) 21  18  Temp:  98.8 F (37.1 C)    TempSrc:  Oral    SpO2:  (!) 82% 95%   Weight: 41.7 kg     Height: 5\' 1"  (1.549 m)       Constitutional: emaciated, cachectic female, awake, alert, frail appearing, NAD, calm, comfortable Eyes: PERRL, lids and conjunctivae normal ENMT: Mucous membranes are moist. Posterior pharynx clear of any exudate or lesions.  Neck: normal, supple, no masses, no thyromegaly Respiratory: rales, crackles LLL, mild increased work of breathing.   Cardiovascular: normal s1, s2  sounds,  1+ lower extremity edema. 2+ pedal pulses. No carotid bruits.  Abdomen: no tenderness, no masses palpated. No hepatosplenomegaly. Bowel sounds positive.  Musculoskeletal: no clubbing / cyanosis. No joint deformity upper and lower extremities. Good ROM, no contractures. Normal muscle tone.  Skin: no rashes, lesions, ulcers. No induration Neurologic: CN 2-12 grossly intact. Sensation intact, DTR normal. Strength 5/5 in  all 4.  Psychiatric: Normal judgment and insight. Alert and oriented x 3. Normal mood.   Labs on Admission: I have personally reviewed following labs and imaging studies  CBC: Recent Labs  Lab 09/27/23 1122  WBC 9.2  NEUTROABS 7.3  HGB 10.4*  HCT 30.0*  MCV 92.0  PLT 514*   Basic Metabolic Panel: Recent Labs  Lab 09/27/23 1122  NA 124*  K 2.8*  CL 86*  CO2 24  GLUCOSE 136*  BUN 14  CREATININE 0.68  CALCIUM 8.7*   GFR: Estimated Creatinine Clearance: 32 mL/min (by C-G formula based on SCr of 0.68 mg/dL). Liver Function Tests: Recent Labs  Lab 09/27/23 1122  AST 27  ALT 14  ALKPHOS 180*  BILITOT 0.7  PROT 7.2  ALBUMIN 3.5   No results for input(s): "LIPASE", "AMYLASE" in the last 168 hours. No results for input(s): "AMMONIA" in the last 168 hours. Coagulation Profile: No results for input(s): "INR", "PROTIME" in the last 168 hours. Cardiac Enzymes: No results for input(s): "CKTOTAL", "CKMB", "CKMBINDEX", "TROPONINI" in the last 168 hours. BNP (last 3 results) No results for input(s): "PROBNP" in the last 8760 hours. HbA1C: No results for input(s): "HGBA1C" in the last 72 hours. CBG: No results for input(s): "GLUCAP" in the last 168 hours. Lipid Profile: No results for input(s): "CHOL", "HDL", "LDLCALC", "TRIG", "CHOLHDL", "LDLDIRECT" in the last 72 hours. Thyroid Function Tests: No results for input(s): "TSH", "T4TOTAL", "FREET4", "T3FREE", "THYROIDAB" in the last 72 hours. Anemia Panel: No results for input(s): "VITAMINB12",  "FOLATE", "FERRITIN", "TIBC", "IRON", "RETICCTPCT" in the last 72 hours. Urine analysis:    Component Value Date/Time   COLORURINE YELLOW 08/30/2023 2220   APPEARANCEUR CLEAR 08/30/2023 2220   LABSPEC 1.014 08/30/2023 2220   PHURINE 6.0 08/30/2023 2220   GLUCOSEU 50 (A) 08/30/2023 2220   HGBUR NEGATIVE 08/30/2023 2220   BILIRUBINUR NEGATIVE 08/30/2023 2220   KETONESUR 5 (A) 08/30/2023 2220   PROTEINUR 30 (A) 08/30/2023 2220   UROBILINOGEN 1.0 03/09/2013 2028   NITRITE NEGATIVE 08/30/2023 2220   LEUKOCYTESUR NEGATIVE 08/30/2023 2220    Radiological Exams on Admission: No results found.  CXR: 11/12   Assessment/Plan Principal Problem:   Acute heart failure with preserved ejection fraction (HFpEF) (HCC) Active Problems:   Pleural effusion on left   Acute respiratory failure with hypoxia (HCC)   Mixed hyperlipidemia   Cigarette smoker   Murmur   HTN (hypertension)   Protein-calorie malnutrition, severe   Ileostomy in place [facility])   Hyponatremia   Bronchogenic cancer of right lung (HCC)   COPD (chronic obstructive pulmonary disease) (HCC)   S/p left hip fracture   Hypokalemia   Elevated brain natriuretic peptide (BNP) level   Acute HFpEF - pt appears volume overloaded with peripheral edema and crackles on chest exam - BNP elevated at 743  - IV furosemide 80 mg x 1 dose given in ED  - hold further diuretic for now, reassess need for further diuretic on 11/13.  - monitor I/O, weight and electrolytes - low sodium diet - update TTE pending  - obtain high quality ReDs Vest reading  - further recommendations to follow response to these therapies  Acute respiratory failure with hypoxia  - pt is not on home oxygen  - now requiring 2L/min [place] - continue supplemental oxygen for now - suspect this is secondary to left pleural effusion  - thoracentesis requested  - treating acute heart failure as noted above  Hypokalemia  - check Mg - oral  replacement ordered - recheck  in AM   Left  pleural effusion  - pt was consented by me at bedside and wants to proceed  - US thoracentesis requested  - requested fluid be sent for studies  Hyponatremia - suspect secondary to pleural effusion or SIADH of malignancy - urine studies ordered in ED - volume overload addressed in ED with IV lasix  - recheck BMP in AM   Bronchogenic carcinoma in right lung - pt was treated with radiation therapy  - pt to follow up with Dr. Ellin Saba in Feb 2025 to repeat imaging  S/p left hip fracture  - s/p left hip hemiarthroplasty on 08/31/23 by Dr. Dallas Schimke - Dr. Dallas Schimke reached out to me and recommended "If she continues to have pain in the groin, we should consider getting an MRI to rule out a nondisplaced fracture" and to contact him with questions.   COPD - scheduled bronchodilators and mucinex - does not show clinical signs to be in acute exacerbation at this time  Longterm smoker - requested smoking cessation education be given to patient  Severe protein calorie malnutrition  - regular diet ordered - dietitian expertise recommendations requested  Ileostomy - routine ileostomy care   DNR present on admission  - will continue DNR order in hospital and respect her wishes   Hyperlipidemia - resume  home statin when home medications reconciled   Microcytic anemia  - pt reports taking daily iron supplement - resume when home meds reconciled   DVT prophylaxis: SCD; hold heparins in anticipation of thoracentesis  Code Status: DNR   Family Communication:   Disposition Plan: TBD   Consults called: radiologist   Admission status: INP  Level of care: Telemetry Standley Dakins MD [facility] How to contact the [facility] Attending or Consulting provider 7A - 7P or covering provider during after hours 7P -7A, for this patient?  Check the care team in [facility] and look for a) attending/consulting [facility] provider listed and b) the [facility] team listed Log into [email or web address] and use  [place]'s universal password to access. If you do not have the password, please contact the hospital operator. Locate the [facility] provider you are looking for under [facility] and page to a number that you can be directly reached. If you still have difficulty reaching the provider, please page the [facility] (Director on Call) for the Hospitalists listed on amion for assistance.   If 7PM-7AM, please contact night-coverage [email or web address] Password [facility]  09/27/2023, 12:34 PM

## 2023-09-27 NOTE — ED Triage Notes (Signed)
Pt presents with increased SOB for several days, unable to sleep, sleeping in recliner, had left partial hip replacement approx 2 weeks ago at this hospital, O2-82% in triage placed on O2 2 liters in triage.

## 2023-09-27 NOTE — ED Provider Notes (Signed)
[facility] MEDICAL SURGICAL UNIT Provider Note  CSN: [id number] Arrival date & time: 09/27/23 1031  Chief Complaint(s) Shortness of Breath  HPI Kathleen Pope is a 89 y.o. female with past medical history as below, significant for AVM, IDA, COPD, systolic murmur, HLD, HTN, lung cancer, ileostomy who presents to the ED with complaint of dyspnea  She had left-sided hip arthroplasty 10/16 Dr. Dallas Schimke following fall.  She was seen in the office 10/30 complaint of ongoing groin and hip pain.  Patient here today with difficulty breathing over the past 3 days.  Concerned that she is retaining fluid.  She has noticed leg swelling, exertional dyspnea and orthopnea.  She does not wear home oxygen and was hypoxic on arrival placed on nasal cannula improvement to her pulse ox.  She started taking p.o. Lasix yesterday and is not had much urine output since then.  No chest pain.  She has cough and is intermittently productive with clear phlegm.  No fevers or chills.  No abdominal pain.  No nausea or vomiting.  No complaints in regards to her ileostomy.  Compliant with iron tablets.  Past Medical History Past Medical History:  Diagnosis Date   Acute blood loss anemia 08/18/2012   Acute hypoxic respiratory failure (HCC) 05/12/2022   Anemia    pt denies, yet takes iron   AVM (arteriovenous malformation) of colon 08/18/2012   Benign neoplasm of ascending colon    Benign neoplasm of cecum    Cavitating mass in right upper lung lobe 01/02/2023   Chronic airway obstruction, not elsewhere classified    Complication of anesthesia    COPD (chronic obstructive pulmonary disease) (HCC)    De Quervain's disease (tenosynovitis) 03/14/2014   Diverticulosis    External hemorrhoids 03/08/2013   GI hemorrhage from post-treatment cecal ulcer 08/18/2012   Heart murmur    systolic with bil rad into carotids   Hemorrhoids    History of radiation therapy    5 cylces stereotactic body radiotherapy   HTN  (hypertension)    Hypercholesteremia    Hyponatremia 07/19/2023   Lower GI bleed multiple   AVM's   Lung cancer (HCC) 02/14/2023   Lung mass 02/05/2023   Oxygen deficiency    PONV (postoperative nausea and vomiting)    Pressure injury of skin 07/19/2023   Prolonged QT interval 08/30/2023   Severe sepsis (HCC) 08/30/2023   Patient Active Problem List   Diagnosis Date Noted   Acute heart failure with preserved ejection fraction (HFpEF) (HCC) 09/27/2023   S/p left hip fracture 09/27/2023   Hypokalemia 09/27/2023   Elevated brain natriuretic peptide (BNP) level 09/27/2023   Microcytic anemia 09/27/2023   DNR (do not resuscitate) 09/27/2023   Closed left hip fracture (HCC) 08/30/2023   CAP (community acquired pneumonia) 08/30/2023   COPD (chronic obstructive pulmonary disease) (HCC) 08/30/2023   SBO (small bowel obstruction) (HCC) 07/18/2023   Bronchogenic cancer of right lung (HCC) 01/16/2023   Acute respiratory failure with hypoxia (HCC) 05/12/2022   Hyponatremia 01/27/2021   Ileostomy in place (HCC) 01/24/2021   High output ileostomy (HCC) 01/24/2021   Protein-calorie malnutrition, severe 01/21/2021   Ischemic colitis with LGIB s/p colectomies & ileostomy 3/3 & 01/17/2021 01/19/2021   Squamous cell carcinoma of skin of right middle finger 12/11/2020   Pleural effusion on left 11/27/2020   GI hemorrhage at cecal ulcer s/p colectomy/ileostomy 01/17/2021 08/18/2012   HTN (hypertension) 08/18/2012   Mixed hyperlipidemia 08/04/2012   Cigarette smoker 08/04/2012   Murmur 08/04/2012  Home Medication(s) Prior to Admission medications   Medication Sig Start Date End Date Taking? Authorizing Provider  acetaminophen (TYLENOL) 500 MG tablet Take 2 tablets (1,000 mg total) by mouth every 6 (six) hours as needed for moderate pain or mild pain. 02/14/23  Yes Leslye Peer, MD  amLODipine (NORVASC) 5 MG tablet Take 5 mg by mouth daily. 09/26/23  Yes [provider]  ECHINACEA PO Take  167 mg by mouth daily.   Yes [provider]  Ferrous Sulfate 27 MG TABS Take 54 mg by mouth daily with breakfast.   Yes [provider]  fluticasone-salmeterol (ADVAIR) 100-50 MCG/ACT AEPB Inhale 1 puff into the lungs 2 (two) times daily. 09/06/23  Yes Sharee Holster, NP  metoprolol tartrate (LOPRESSOR) 25 MG tablet Take 0.5 tablets (12.5 mg total) by mouth 2 (two) times daily. 09/06/23  Yes Sharee Holster, NP  Multiple Vitamins-Minerals (MULTIVITAMIN WITH MINERALS) tablet Take 1 tablet by mouth daily. Centrum Silver   Yes [provider]  Omega-3 Fatty Acids (FISH OIL) 1000 MG CAPS Take 1,000 Units by mouth daily.   Yes [provider]  Potassium 99 MG TABS Take 1 tablet (99 mg total) by mouth daily at 2 PM. 09/06/23  Yes Green, Chong Sicilian, NP  pravastatin (PRAVACHOL) 10 MG tablet Take 1 tablet (10 mg total) by mouth daily. 09/06/23  Yes Sharee Holster, NP                                                                                                                                    Past Surgical History Past Surgical History:  Procedure Laterality Date   BRONCHIAL BIOPSY  02/14/2023   Procedure: BRONCHIAL BIOPSIES;  Surgeon: Leslye Peer, MD;  Location: [facility] ENDOSCOPY;  Service: Pulmonary;;   BRONCHIAL BRUSHINGS  02/14/2023   Procedure: BRONCHIAL BRUSHINGS;  Surgeon: Leslye Peer, MD;  Location: [facility] ENDOSCOPY;  Service: Pulmonary;;   BRONCHIAL NEEDLE ASPIRATION BIOPSY  02/14/2023   Procedure: BRONCHIAL NEEDLE ASPIRATION BIOPSIES;  Surgeon: Leslye Peer, MD;  Location: [facility] ENDOSCOPY;  Service: Pulmonary;;   BRONCHIAL WASHINGS  02/14/2023   Procedure: BRONCHIAL WASHINGS;  Surgeon: Leslye Peer, MD;  Location: [facility] ENDOSCOPY;  Service: Pulmonary;;   BUNIONECTOMY     left   COLECTOMY     COLON RESECTION N/A 01/17/2021   Procedure: EXPLORATORY LAPAROTOMY COMPLETION COLECTOMY ileostomy;  Surgeon: Axel Filler, MD;  Location: [facility] ORS;  Service:  General;  Laterality: N/A;   COLONOSCOPY  08/10/2012   Procedure: COLONOSCOPY;  Surgeon: Meryl Dare, MD,FACG;  Location: [facility] ENDOSCOPY;  Service: Endoscopy;  Laterality: N/A;   COLONOSCOPY  08/19/2012   Procedure: COLONOSCOPY;  Surgeon: Iva Boop, MD;  Location: [facility] ENDOSCOPY;  Service: Endoscopy;  Laterality: N/A;   COLONOSCOPY N/A 03/11/2013   Procedure: COLONOSCOPY;  Surgeon: Hilarie Fredrickson, MD;  Location: [facility] ENDOSCOPY;  Service: Endoscopy;  Laterality: N/A;  COLONOSCOPY N/A 01/16/2021   Procedure: COLONOSCOPY;  Surgeon: Sherrilyn Rist, MD;  Location: [facility] ENDOSCOPY;  Service: Gastroenterology;  Laterality: N/A;   COLONOSCOPY WITH PROPOFOL N/A 01/10/2021   Procedure: COLONOSCOPY WITH PROPOFOL;  Surgeon: Hilarie Fredrickson, MD;  Location: [facility] ENDOSCOPY;  Service: Endoscopy;  Laterality: N/A;   COLOSTOMY N/A 01/15/2021   Procedure: COLOSTOMY;  Surgeon: Harriette Bouillon, MD;  Location: [facility] ORS;  Service: General;  Laterality: N/A;   ESOPHAGOGASTRODUODENOSCOPY (EGD) WITH PROPOFOL N/A 01/14/2021   Procedure: ESOPHAGOGASTRODUODENOSCOPY (EGD) WITH PROPOFOL;  Surgeon: Sherrilyn Rist, MD;  Location: [facility] ENDOSCOPY;  Service: Endoscopy;  Laterality: N/A;   FIDUCIAL MARKER PLACEMENT  02/14/2023   Procedure: FIDUCIAL MARKER PLACEMENT;  Surgeon: Leslye Peer, MD;  Location[facility] ENDOSCOPY;  Service: Pulmonary;;   FLEXIBLE SIGMOIDOSCOPY N/A 01/14/2021   Procedure: Arnell Sieving;  Surgeon: Sherrilyn Rist, MD;  Location: [facility] ENDOSCOPY;  Service: Endoscopy;  Laterality: N/A;   HIP ARTHROPLASTY Left 08/31/2023   Procedure: ARTHROPLASTY BIPOLAR HIP (HEMIARTHROPLASTY);  Surgeon: Oliver Barre, MD;  Location: [facility] ORS;  Service: Orthopedics;  Laterality: Left;   HOT HEMOSTASIS N/A 01/10/2021   Procedure: HOT HEMOSTASIS (ARGON PLASMA COAGULATION/BICAP);  Surgeon: Hilarie Fredrickson, MD;  Location: [facility] ENDOSCOPY;  Service: Endoscopy;  Laterality: N/A;   IR ANGIOGRAM SELECTIVE EACH ADDITIONAL VESSEL   01/12/2021   IR ANGIOGRAM SELECTIVE EACH ADDITIONAL VESSEL  01/12/2021   IR ANGIOGRAM SELECTIVE EACH ADDITIONAL VESSEL  01/12/2021   IR ANGIOGRAM VISCERAL SELECTIVE  01/09/2021   IR ANGIOGRAM VISCERAL SELECTIVE  01/12/2021   IR EMBO ARTERIAL NOT HEMORR HEMANG INC GUIDE ROADMAPPING  01/12/2021   IR US GUIDE VASC ACCESS RIGHT  01/09/2021   IR US GUIDE VASC ACCESS RIGHT  01/12/2021   JOINT REPLACEMENT     PARTIAL COLECTOMY N/A 01/15/2021   Procedure: PARTIAL COLECTOMY;  Surgeon: Harriette Bouillon, MD;  Location: [facility] ORS;  Service: General;  Laterality: N/A;   POLYPECTOMY  01/10/2021   Procedure: POLYPECTOMY;  Surgeon: Hilarie Fredrickson, MD;  Location: [facility] ENDOSCOPY;  Service: Endoscopy;;   SKIN CANCER EXCISION Right 12/2022   Right Leg above knee   TONSILLECTOMY     VIDEO BRONCHOSCOPY WITH ENDOBRONCHIAL ULTRASOUND Bilateral 02/14/2023   Procedure: VIDEO BRONCHOSCOPY WITH ENDOBRONCHIAL ULTRASOUND;  Surgeon: Leslye Peer, MD;  Location: [facility] ENDOSCOPY;  Service: Pulmonary;  Laterality: Bilateral;   VIDEO BRONCHOSCOPY WITH RADIAL ENDOBRONCHIAL ULTRASOUND  02/14/2023   Procedure: VIDEO BRONCHOSCOPY WITH RADIAL ENDOBRONCHIAL ULTRASOUND;  Surgeon: Leslye Peer, MD;  Location: [facility] ENDOSCOPY;  Service: Pulmonary;;   Family History Family History  Problem Relation Age of Onset   Breast cancer Mother    Lung cancer Father    Breast cancer Sister    Anuerysm Brother        in stomach   Colon cancer Neg Hx    Esophageal cancer Neg Hx    Rectal cancer Neg Hx     Social History Social History   Tobacco Use   Smoking status: Every Day    Current packs/day: 1.00    Average packs/day: 1 pack/day for 59.0 years (59.0 ttl pk-yrs)    Types: Cigarettes   Smokeless tobacco: Never   Tobacco comments:    Started smoking at age 64  Vaping Use   Vaping status: Never Used  Substance Use Topics   Alcohol use: No   Drug use: No   Allergies Patient has no known allergies.  Review of Systems Review  of Systems  Constitutional:  Positive for fatigue. Negative for chills and fever.  Respiratory:  Positive for cough and shortness of breath.   Cardiovascular:  Positive for leg swelling. Negative for chest pain and palpitations.  Gastrointestinal:  Negative for abdominal pain, nausea and vomiting.  Genitourinary:  Negative for dysuria.  Musculoskeletal:  Positive for arthralgias.  Neurological:  Negative for light-headedness.  All other systems reviewed and are negative.   Physical Exam Vital Signs  I have reviewed the triage vital signs BP (!) 160/77 (BP Location: Left Arm)   Pulse 94   Temp 98.3 F (36.8 C) (Oral)   Resp 18   Ht 5\' 1"  (1.549 m)   Wt 43.7 kg   SpO2 93%   BMI 18.20 kg/m  Physical Exam Vitals and nursing note reviewed.  Constitutional:      General: She is in acute distress.     Appearance: Normal appearance. She is well-developed.  HENT:     Head: Normocephalic and atraumatic.     Right Ear: External ear normal.     Left Ear: External ear normal.     Nose: Nose normal.     Mouth/Throat:     Mouth: Mucous membranes are moist.  Eyes:     General: No scleral icterus.       Right eye: No discharge.        Left eye: No discharge.  Cardiovascular:     Rate and Rhythm: Normal rate and regular rhythm.     Pulses: Normal pulses.     Heart sounds: Normal heart sounds.  Pulmonary:     Effort: Tachypnea and respiratory distress present.     Breath sounds: No stridor. Decreased breath sounds present.     Comments: Tachypnea and hypoxia on arrival, coarse breath sounds bilateral Abdominal:     General: Abdomen is flat. There is no distension.     Palpations: Abdomen is soft.     Tenderness: There is no abdominal tenderness.       Comments: Ileostomy with dark stool, iron tablets.  Stoma is pink  Musculoskeletal:     Cervical back: No rigidity.     Right lower leg: Edema present.     Left lower leg: Edema present.  Skin:    General: Skin is warm and dry.      Capillary Refill: Capillary refill takes less than 2 seconds.  Neurological:     Mental Status: She is alert and oriented to person, place, and time.     GCS: GCS eye subscore is 4. GCS verbal subscore is 5. GCS motor subscore is 6.  Psychiatric:        Mood and Affect: Mood normal.        Behavior: Behavior normal. Behavior is cooperative.     ED Results and Treatments Labs (all labs ordered are listed, but only abnormal results are displayed) Labs Reviewed  CBC WITH DIFFERENTIAL/PLATELET - Abnormal; Notable for the following components:      Result Value   RBC 3.26 (*)    Hemoglobin 10.4 (*)    HCT 30.0 (*)    RDW 15.6 (*)    Platelets 514 (*)    All other components within normal limits  COMPREHENSIVE METABOLIC PANEL - Abnormal; Notable for the following components:   Sodium 124 (*)    Potassium 2.8 (*)    Chloride 86 (*)    Glucose, Bld 136 (*)    Calcium 8.7 (*)    Alkaline Phosphatase 180 (*)  All other components within normal limits  BRAIN NATRIURETIC PEPTIDE - Abnormal; Notable for the following components:   B Natriuretic Peptide 743.0 (*)    All other components within normal limits  NA AND K (SODIUM & POTASSIUM), RAND UR  OSMOLALITY  OSMOLALITY, URINE  TROPONIN I (HIGH SENSITIVITY)                                                                                                                          Radiology CXR > PENDING  Pertinent labs & imaging results that were available during my care of the patient were reviewed by me and considered in my medical decision making (see MDM for details).  Medications Ordered in ED Medications  acetaminophen (TYLENOL) tablet 650 mg (has no administration in time range)    Or  acetaminophen (TYLENOL) suppository 650 mg (has no administration in time range)  oxyCODONE (Oxy IR/ROXICODONE) immediate release tablet 2.5 mg (has no administration in time range)  fentaNYL (SUBLIMAZE) injection 12.5 mcg (has no  administration in time range)  bisacodyl (DULCOLAX) EC tablet 5 mg (has no administration in time range)  ondansetron (ZOFRAN) tablet 4 mg (has no administration in time range)    Or  ondansetron (ZOFRAN) injection 4 mg (has no administration in time range)  guaiFENesin (MUCINEX) 12 hr tablet 600 mg (600 mg Oral Given 09/27/23 1457)  ipratropium-albuterol (DUONEB) 0.5-2.5 (3) MG/3ML nebulizer solution 3 mL (3 mLs Nebulization Given 09/27/23 1609)  nicotine (NICODERM CQ - dosed in mg/24 hours) patch 14 mg (14 mg Transdermal Patient Refused/Not Given 09/27/23 1457)  potassium chloride SA (KLOR-CON M) CR tablet 40 mEq (has no administration in time range)  furosemide (LASIX) injection 80 mg (80 mg Intravenous Given 09/27/23 1217)  potassium chloride SA (KLOR-CON M) CR tablet 40 mEq (40 mEq Oral Given 09/27/23 1217)                                                                                                                                     Procedures .Critical Care  Performed by: Sloan Leiter, DO Authorized by: Sloan Leiter, DO   Critical care provider statement:    Critical care time (minutes):  30   Critical care time was exclusive of:  Separately billable procedures and treating other patients   Critical care was necessary to treat or prevent imminent or life-threatening deterioration of the following  conditions:  Respiratory failure and metabolic crisis   Critical care was time spent personally by me on the following activities:  Development of treatment plan with patient or surrogate, discussions with consultants, evaluation of patient's response to treatment, examination of patient, ordering and review of laboratory studies, ordering and review of radiographic studies, ordering and performing treatments and interventions, pulse oximetry, re-evaluation of patient's condition, review of old charts and obtaining history from patient or surrogate   Care discussed with: admitting  provider     (including critical care time)  Medical Decision Making / ED Course    Medical Decision Making:    Kathleen Pope is a 89 y.o. female  with past medical history as below, significant for AVM, IDA, COPD, systolic murmur, HLD, HTN, lung cancer, ileostomy who presents to the ED with complaint of dyspnea. The complaint involves an extensive differential diagnosis and also carries with it a high risk of complications and morbidity.  Serious etiology was considered. Ddx includes but is not limited to: In my evaluation of this patient's dyspnea my DDx includes, but is not limited to, pneumonia, pulmonary embolism, pneumothorax, pulmonary edema, metabolic acidosis, asthma, COPD, cardiac cause, anemia, anxiety, etc.    Complete initial physical exam performed, notably the patient  was acute respiratory difficulty on arrival, placed on nasal cannula improvement.  Pulse ox 82% on room air.    Reviewed and confirmed nursing documentation for past medical history, family history, social history.  Vital signs reviewed.    Clinical Course as of 09/27/23 1624  Tue Sep 27, 2023  1215 Hemoglobin(!): 10.4 Similar to prior  [SG]    Clinical Course User Index [SG] Sloan Leiter, DO     Patient with difficulty breathing over the past 3 days, orthopnea and exertional dyspnea, leg swelling, weight gain.  Hypoxia on arrival.  She appears volume overloaded on exam.  Concern for acute CHF.  Last echocardiogram was 6/23 with LVEF 70 to 75% and G1 DD  She also has hyponatremia, hypokalemia, concern for volume overload.  Will give oral electrolyte replacement, start diuresis given volume overload, pulmonary edema on imaging and hypoxia.  She does not take diuretic other than Lasix which was started yesterday.  Possible SIADH, pending urine lytes   She has large pleural effusion, would likely benefit from thoracentesis; formal xr interpretation pending  Plan for  admission                 Additional history obtained: -Additional history obtained from family -External records from outside source obtained and reviewed including: Chart review including previous notes, labs, imaging, consultation notes including  Prior labs Prior admission Home medications   Lab Tests: -I ordered, reviewed, and interpreted labs.   The pertinent results include:   Labs Reviewed  CBC WITH DIFFERENTIAL/PLATELET - Abnormal; Notable for the following components:      Result Value   RBC 3.26 (*)    Hemoglobin 10.4 (*)    HCT 30.0 (*)    RDW 15.6 (*)    Platelets 514 (*)    All other components within normal limits  COMPREHENSIVE METABOLIC PANEL - Abnormal; Notable for the following components:   Sodium 124 (*)    Potassium 2.8 (*)    Chloride 86 (*)    Glucose, Bld 136 (*)    Calcium 8.7 (*)    Alkaline Phosphatase 180 (*)    All other components within normal limits  BRAIN NATRIURETIC PEPTIDE - Abnormal; Notable for the  following components:   B Natriuretic Peptide 743.0 (*)    All other components within normal limits  NA AND K (SODIUM & POTASSIUM), RAND UR  OSMOLALITY  OSMOLALITY, URINE  TROPONIN I (HIGH SENSITIVITY)    Notable for hypona, BNP +, trop +  EKG   EKG Interpretation Date/Time:  Tuesday September 27 2023 11:16:18 EST Ventricular Rate:  93 PR Interval:  149 QRS Duration:  96 QT Interval:  383 QTC Calculation: 477 R Axis:   43  Text Interpretation: Sinus tachycardia Ventricular premature complex LVH with secondary repolarization abnormality Confirmed by Tanda Rockers ([id number]) on 09/27/2023 11:23:19 AM         Imaging Studies ordered: I ordered imaging studies including CXR I independently visualized the following imaging with scope of interpretation limited to determining acute life threatening conditions related to emergency care; findings noted above   Medicines ordered and prescription drug management: Meds  ordered this encounter  Medications   furosemide (LASIX) injection 80 mg   potassium chloride SA (KLOR-CON M) CR tablet 40 mEq   DISCONTD: sodium chloride tablet 1 g   OR Linked Order Group    acetaminophen (TYLENOL) tablet 650 mg    acetaminophen (TYLENOL) suppository 650 mg   oxyCODONE (Oxy IR/ROXICODONE) immediate release tablet 2.5 mg   fentaNYL (SUBLIMAZE) injection 12.5 mcg   bisacodyl (DULCOLAX) EC tablet 5 mg   OR Linked Order Group    ondansetron (ZOFRAN) tablet 4 mg    ondansetron (ZOFRAN) injection 4 mg   guaiFENesin (MUCINEX) 12 hr tablet 600 mg   ipratropium-albuterol (DUONEB) 0.5-2.5 (3) MG/3ML nebulizer solution 3 mL   nicotine (NICODERM CQ - dosed in mg/24 hours) patch 14 mg   potassium chloride SA (KLOR-CON M) CR tablet 40 mEq    -I have reviewed the patients home medicines and have made adjustments as needed   Consultations Obtained: na   Cardiac Monitoring: The patient was maintained on a cardiac monitor.  I personally viewed and interpreted the cardiac monitored which showed an underlying rhythm of: NSR Continuous pulse oximetry interpreted by myself, 96% on 2L.    Social Determinants of Health:  Diagnosis or treatment significantly limited by social determinants of health: current smoker   Reevaluation: After the interventions noted above, I reevaluated the patient and found that they have improved  Co morbidities that complicate the patient evaluation  Past Medical History:  Diagnosis Date   Acute blood loss anemia 08/18/2012   Acute hypoxic respiratory failure (HCC) 05/12/2022   Anemia    pt denies, yet takes iron   AVM (arteriovenous malformation) of colon 08/18/2012   Benign neoplasm of ascending colon    Benign neoplasm of cecum    Cavitating mass in right upper lung lobe 01/02/2023   Chronic airway obstruction, not elsewhere classified    Complication of anesthesia    COPD (chronic obstructive pulmonary disease) (HCC)    De Quervain's  disease (tenosynovitis) 03/14/2014   Diverticulosis    External hemorrhoids 03/08/2013   GI hemorrhage from post-treatment cecal ulcer 08/18/2012   Heart murmur    systolic with bil rad into carotids   Hemorrhoids    History of radiation therapy    5 cylces stereotactic body radiotherapy   HTN (hypertension)    Hypercholesteremia    Hyponatremia 07/19/2023   Lower GI bleed multiple   AVM's   Lung cancer (HCC) 02/14/2023   Lung mass 02/05/2023   Oxygen deficiency    PONV (postoperative nausea and vomiting)  Pressure injury of skin 07/19/2023   Prolonged QT interval 08/30/2023   Severe sepsis (HCC) 08/30/2023      Dispostion: Disposition decision including need for hospitalization was considered, and patient admitted to the hospital.    Final Clinical Impression(s) / ED Diagnoses Final diagnoses:  Respiratory distress  Acute congestive heart failure, unspecified heart failure type (HCC)  Hyponatremia  Hypokalemia        Sloan Leiter, DO 09/27/23 1625

## 2023-09-27 NOTE — Telephone Encounter (Signed)
Mrs. Kathleen Pope has been admitted to [facility] for medical reasons.  I have reached out to the admitting provider to review her care.   Thanks   Terrye Dombrosky A. Dallas Schimke, MD MS [facility] [address] [place],  [place]  [address] Phone: [phone] Fax: [phone]

## 2023-09-27 NOTE — Hospital Course (Signed)
89 year old female longtime smoker with COPD but not on oxygen, HFpEF, hypertension, hyperlipidemia, right lung cancer status post radiation treatments, ileostomy, status post left hip arthroplasty 10/16 Dr. Dallas Schimke and other PMH detailed below presented to ED complaining of increasing SOB over the past 3 days.  She reports increasing edema in the legs.  She is having exertional dyspnea symptoms.  She also has orthopnea and has to sit up in a recliner to sleep at night.  She was noted to be hypoxic on arrival with a pulse ox of 83% on room air.  She was placed on 2 L nasal cannula with some improvement.  She reportedly started taking her home as needed Lasix yesterday but had poor urine output.  She denies palpitations and chest pain symptoms.  No chest pressure.  She has chronic cough.  She denies nausea and vomiting.  She says her symptoms feel similar to when she had to have a thoracentesis done several years ago due to a right pleural effusion.  Her cardiac BNP was elevated and greater than 700.  She was noted to have peripheral edema and her chest x-ray showed findings consistent with congestive heart failure and a large left pleural effusion.  IV Lasix 80 mg given in the ED.  Patient noted to have sodium of 124 and a potassium of 2.8.  Blood glucose was 136 and hemoglobin was 10.4.  Hospital admission was requested for further management of acute respiratory failure with hypoxia and Acute HFpEF.

## 2023-09-27 NOTE — Progress Notes (Signed)
Initial Nutrition Assessment  DOCUMENTATION CODES:   Severe malnutrition in context of chronic illness, Underweight  INTERVENTION:   Magic cup TID with meals, each supplement provides 290 kcal and 9 grams of protein Pudding TID with meals MVI with minerals daily  NUTRITION DIAGNOSIS:   Severe Malnutrition related to chronic illness (HF) as evidenced by severe muscle depletion, severe fat depletion.  GOAL:   Patient will meet greater than or equal to 90% of their needs  MONITOR:   PO intake  REASON FOR ASSESSMENT:   Consult Assessment of nutrition requirement/status  ASSESSMENT:   89 yo female admitted with SOB, acute HF, L pleural effusion. PMH includes COPD, HF, HTN, HLD, lung cancer S/P radiation, smoker.  Patient states that she has a good appetite and has been eating well. She has coffee, juice, and half a muffin for breakfast, a sandwich for lunch, and chicken and dumplings or [place] stew (if she's hungry) for dinner. She does not like PO supplements or milk. She likes "real food" such as "bacon and eggs." She agreed to try magic cups with meals. Also requested pudding with meals.   Labs reviewed. Na 124, K 2.8  Medications reviewed and include lasix x 1, klor-con, sodium chloride tablet x 1.  Weight history reviewed. No significant weight changes noted over the past 9 months. Current weight is slightly above usual weight, likely related to fluid overload with associated with HF.   Patient meets criteria for severe malnutrition, given severe depletion of muscle and subcutaneous fat mass.  NUTRITION - FOCUSED PHYSICAL EXAM:  Flowsheet Row Most Recent Value  Orbital Region Severe depletion  Upper Arm Region Severe depletion  Thoracic and Lumbar Region Moderate depletion  Buccal Region Moderate depletion  Temple Region Severe depletion  Clavicle Bone Region Severe depletion  Clavicle and Acromion Bone Region Severe depletion  Scapular Bone Region Severe  depletion  Dorsal Hand Severe depletion  Patellar Region Moderate depletion  Anterior Thigh Region Moderate depletion  Posterior Calf Region Moderate depletion  Edema (RD Assessment) Mild  Hair Reviewed  Eyes Reviewed  Mouth Reviewed  Skin Reviewed  Nails Reviewed       Diet Order:   Diet Order             DIET DYS 3 Room service appropriate? Yes; Fluid consistency: Thin  Diet effective now                   EDUCATION NEEDS:   Education needs have been addressed  Skin:  Skin Assessment: Reviewed RN Assessment  Last BM:  11/12  Height:   Ht Readings from Last 1 Encounters:  09/27/23 5\' 1"  (1.549 m)    Weight:   Wt Readings from Last 1 Encounters:  09/27/23 43.7 kg    Ideal Body Weight:  47.7 kg  BMI:  Body mass index is 18.2 kg/m.  Estimated Nutritional Needs:   Kcal:  1400-1600  Protein:  65-75 gm  Fluid:  1.4 L   Gabriel Rainwater RD, LDN, CNSC Please refer to Amion for contact information.

## 2023-09-28 ENCOUNTER — Inpatient Hospital Stay (HOSPITAL_COMMUNITY): Payer: Medicare Other

## 2023-09-28 ENCOUNTER — Encounter (HOSPITAL_COMMUNITY): Payer: Self-pay | Admitting: Family Medicine

## 2023-09-28 ENCOUNTER — Other Ambulatory Visit (HOSPITAL_COMMUNITY): Payer: Medicare Other

## 2023-09-28 ENCOUNTER — Other Ambulatory Visit (HOSPITAL_COMMUNITY): Payer: Self-pay | Admitting: *Deleted

## 2023-09-28 DIAGNOSIS — I5031 Acute diastolic (congestive) heart failure: Secondary | ICD-10-CM | POA: Diagnosis not present

## 2023-09-28 LAB — ECHOCARDIOGRAM COMPLETE
AV Mean grad: 36.5 mm[Hg]
AV Peak grad: 66.3 mm[Hg]
Ao pk vel: 4.07 m/s
Area-P 1/2: 3.48 cm2
Height: 61 in
MV M vel: 5.87 m/s
MV Peak grad: 137.8 mm[Hg]
P 1/2 time: 214 ms
S' Lateral: 2.3 cm
Weight: 1499.13 [oz_av]

## 2023-09-28 LAB — BODY FLUID CELL COUNT WITH DIFFERENTIAL
Eos, Fluid: 0 %
Lymphs, Fluid: 64 %
Monocyte-Macrophage-Serous Fluid: 15 % — ABNORMAL LOW (ref 50–90)
Neutrophil Count, Fluid: 21 % (ref 0–25)
Other Cells, Fluid: 0 %
Total Nucleated Cell Count, Fluid: 310 uL (ref 0–1000)

## 2023-09-28 LAB — BASIC METABOLIC PANEL
Anion gap: 4 — ABNORMAL LOW (ref 5–15)
BUN: 14 mg/dL (ref 8–23)
CO2: 28 mmol/L (ref 22–32)
Calcium: 7.9 mg/dL — ABNORMAL LOW (ref 8.9–10.3)
Chloride: 95 mmol/L — ABNORMAL LOW (ref 98–111)
Creatinine, Ser: 0.69 mg/dL (ref 0.44–1.00)
GFR, Estimated: >60 mL/min (ref >60)
Glucose, Bld: 115 mg/dL — ABNORMAL HIGH (ref 70–99)
Potassium: 4 mmol/L (ref 3.5–5.1)
Sodium: 127 mmol/L — ABNORMAL LOW (ref 135–145)

## 2023-09-28 LAB — GLUCOSE, PLEURAL OR PERITONEAL FLUID: Glucose, Fluid: 135 mg/dL

## 2023-09-28 LAB — CBC
HCT: 29.1 % — ABNORMAL LOW (ref 36.0–46.0)
Hemoglobin: 9.7 g/dL — ABNORMAL LOW (ref 12.0–15.0)
MCH: 31.5 pg (ref 26.0–34.0)
MCHC: 33.3 g/dL (ref 30.0–36.0)
MCV: 94.5 fL (ref 80.0–100.0)
Platelets: 450 10*3/uL — ABNORMAL HIGH (ref 150–400)
RBC: 3.08 MIL/uL — ABNORMAL LOW (ref 3.87–5.11)
RDW: 15.8 % — ABNORMAL HIGH (ref 11.5–15.5)
WBC: 10 10*3/uL (ref 4.0–10.5)
nRBC: 0 % (ref 0.0–0.2)

## 2023-09-28 LAB — MAGNESIUM: Magnesium: 1.9 mg/dL (ref 1.7–2.4)

## 2023-09-28 LAB — PROTEIN, PLEURAL OR PERITONEAL FLUID: Total protein, fluid: <3 g/dL

## 2023-09-28 LAB — LACTATE DEHYDROGENASE: LDH: 160 U/L (ref 98–192)

## 2023-09-28 LAB — BRAIN NATRIURETIC PEPTIDE: B Natriuretic Peptide: 411 pg/mL — ABNORMAL HIGH (ref 0.0–100.0)

## 2023-09-28 MED ORDER — GUAIFENESIN 100 MG/5ML PO LIQD
5.0000 mL | ORAL | Status: DC | PRN
  Administered 2023-09-28: 5 mL via ORAL
  Filled 2023-09-28: qty 5

## 2023-09-28 MED ORDER — METOPROLOL TARTRATE 25 MG PO TABS
12.5000 mg | ORAL_TABLET | Freq: Two times a day (BID) | ORAL | Status: DC
  Administered 2023-09-28 – 2023-09-30 (×5): 12.5 mg via ORAL
  Filled 2023-09-28 (×5): qty 1

## 2023-09-28 MED ORDER — FUROSEMIDE 10 MG/ML IJ SOLN: 20.0000 mg | Freq: Every day | INTRAMUSCULAR | Status: DC

## 2023-09-28 MED ORDER — LIDOCAINE HCL (PF) 2 % IJ SOLN
10.0000 mL | Freq: Once | INTRAMUSCULAR | Status: AC
  Administered 2023-09-28: 10 mL

## 2023-09-28 MED ORDER — IPRATROPIUM-ALBUTEROL 0.5-2.5 (3) MG/3ML IN SOLN
3.0000 mL | Freq: Three times a day (TID) | RESPIRATORY_TRACT | Status: DC
  Administered 2023-09-28 – 2023-09-29 (×2): 3 mL via RESPIRATORY_TRACT
  Filled 2023-09-28 (×2): qty 3

## 2023-09-28 MED ORDER — MOMETASONE FURO-FORMOTEROL FUM 100-5 MCG/ACT IN AERO
2.0000 | INHALATION_SPRAY | Freq: Two times a day (BID) | RESPIRATORY_TRACT | Status: DC
  Administered 2023-09-28 – 2023-09-30 (×4): 2 via RESPIRATORY_TRACT
  Filled 2023-09-28: qty 8.8

## 2023-09-28 MED ORDER — FERROUS SULFATE 325 (65 FE) MG PO TABS
325.0000 mg | ORAL_TABLET | Freq: Every day | ORAL | Status: DC
  Administered 2023-09-29 – 2023-09-30 (×2): 325 mg via ORAL
  Filled 2023-09-28 (×2): qty 1

## 2023-09-28 MED ORDER — PRAVASTATIN SODIUM 10 MG PO TABS
10.0000 mg | ORAL_TABLET | Freq: Every day | ORAL | Status: DC
  Administered 2023-09-28 – 2023-09-30 (×3): 10 mg via ORAL
  Filled 2023-09-28 (×3): qty 1

## 2023-09-28 MED ORDER — MELATONIN 3 MG PO TABS
6.0000 mg | ORAL_TABLET | Freq: Once | ORAL | Status: AC
  Administered 2023-09-28: 6 mg via ORAL
  Filled 2023-09-28: qty 2

## 2023-09-28 NOTE — Plan of Care (Signed)
  Problem: Education: Goal: Knowledge of General Education information will improve Description: Including pain rating scale, medication(s)/side effects and non-pharmacologic comfort measures Outcome: Progressing   Problem: Health Behavior/Discharge Planning: Goal: Ability to manage health-related needs will improve Outcome: Progressing   Problem: Clinical Measurements: Goal: Respiratory complications will improve Outcome: Progressing   Problem: Nutrition: Goal: Adequate nutrition will be maintained Outcome: Progressing   Problem: Coping: Goal: Level of anxiety will decrease Outcome: Progressing   Problem: Elimination: Goal: Will not experience complications related to bowel motility Outcome: Progressing Goal: Will not experience complications related to urinary retention Outcome: Progressing   Problem: Pain Management: Goal: General experience of comfort will improve Outcome: Progressing   Problem: Safety: Goal: Ability to remain free from injury will improve Outcome: Progressing   Problem: Skin Integrity: Goal: Risk for impaired skin integrity will decrease Outcome: Progressing   Problem: Activity: Goal: Risk for activity intolerance will decrease Outcome: Not Progressing

## 2023-09-28 NOTE — Progress Notes (Signed)
Echocardiogram 2D Echocardiogram has been performed.  Lucendia Herrlich 09/28/2023, 3:38 PM

## 2023-09-28 NOTE — Progress Notes (Signed)
Patient tolerated left sided thoracentesis procedure well today and 850 mL of clear yellow pleural fluid removed with labs collected and sent for processing. Patient verbalized understanding of post procedure instructions and transported to xray at this time via stretcher with no acute distress noted.

## 2023-09-28 NOTE — Progress Notes (Signed)
   09/28/23 1418  TOC Brief Assessment  Insurance and Status Reviewed  Patient has primary care physician Yes  Home environment has been reviewed Home  Prior level of function: need assistance  Social Determinants of Health Reivew SDOH reviewed no interventions necessary  Readmission risk has been reviewed Yes  Transition of care needs transition of care needs identified, TOC will continue to follow   Patient admitted with CHF, TOC consulted for CHF education, information added to AVS.  CM waiting for niece to return my call to discuss needs. Last admission ( 10/19)patient was admitted to [facility]. TOC following.

## 2023-09-28 NOTE — Procedures (Signed)
PROCEDURE SUMMARY:  Successful image-guided left thoracentesis. Yielded 850 milliliters of clear yellow fluid. Patient tolerated procedure well. EBL: Zero No immediate complications.  Specimen was sent for labs. Post procedure CXR shows no pneumothorax.  Please see imaging section of Epic for full dictation.  Villa Herb PA-C 09/28/2023 11:04 AM

## 2023-09-28 NOTE — Progress Notes (Signed)
PROGRESS NOTE    Kathleen Pope  ZOX:[id number] DOB: 1932-11-29 DOA: 09/27/2023 PCP: Elfredia Nevins, MD    Brief Narrative:  89 year old female longtime smoker with COPD but not on oxygen, HFpEF, hypertension, hyperlipidemia, right lung cancer status post radiation treatments, ileostomy, status post left hip arthroplasty 10/16 Dr. Dallas Schimke and other PMH detailed below presented to ED complaining of increasing SOB over the past 3 days.  She reports increasing edema in the legs.  She is having exertional dyspnea symptoms.  She also has orthopnea and has to sit up in a recliner to sleep at night.  She was noted to be hypoxic on arrival with a pulse ox of 83% on room air.  She was placed on 2 L nasal cannula with some improvement.  She reportedly started taking her home as needed Lasix yesterday but had poor urine output.  She denies palpitations and chest pain symptoms.  No chest pressure.  She has chronic cough.  She denies nausea and vomiting.  She says her symptoms feel similar to when she had to have a thoracentesis done several years ago due to a right pleural effusion.  Her cardiac BNP was elevated and greater than 700.  She was noted to have peripheral edema and her chest x-ray showed findings consistent with congestive heart failure and a large left pleural effusion.  IV Lasix 80 mg given in the ED.  Patient noted to have sodium of 124 and a potassium of 2.8.  Blood glucose was 136 and hemoglobin was 10.4.  Hospital admission was requested for further management of acute respiratory failure with hypoxia and Acute HFpEF.   Getting thoracentesis   Assessment and Plan: Acute HFpEF - pt appears volume overloaded with peripheral edema and crackles on chest exam - BNP elevated at 743  - IV furosemide 80 mg x 1 dose given in ED  - hold further diuretic for now - monitor I/O, weight and electrolytes - low sodium diet - update TTE pending  - obtain high quality ReDs Vest reading  - further  recommendations to follow response to these therapies   Acute respiratory failure with hypoxia  -  not on home oxygen  - now requiring 2L/min [place] - wean as able - suspect this is secondary to left pleural effusion  - thoracentesis requested  - treating acute heart failure as noted above    Left  pleural effusion  - US thoracentesis requested  - requested fluid be sent for studies   Hyponatremia - improved with IV lasix  Bronchogenic carcinoma in right lung - pt was treated with radiation therapy  - pt to follow up with Dr. Ellin Saba in Feb 2025 to repeat imaging   S/p left hip fracture  - s/p left hip hemiarthroplasty on 08/31/23 by Dr. Dallas Schimke - Dr. Dallas Schimke reached out to me and recommended "If she continues to have pain in the groin, we should consider getting an MRI to rule out a nondisplaced fracture" and to contact him with questions.   -pain is actually in right side of hip  COPD - scheduled bronchodilators and mucinex - does not show clinical signs to be in acute exacerbation at this time   Longterm smoker - requested smoking cessation education be given to patient   Severe protein calorie malnutrition  - regular diet ordered - dietitian expertise recommendations requested   Ileostomy - routine ileostomy care    Hyperlipidemia - resume  home statin   Microcytic anemia  - pt reports taking  daily iron supplement - resume when home meds reconciled  DVT prophylaxis: SCDs Start: 09/27/23 1241    Code Status: Limited: Do not attempt resuscitation (DNR) -DNR-LIMITED -Do Not Intubate/DNI    Disposition Plan:  Level of care: Telemetry Status is: Inpatient Remains inpatient appropriate     Consultants:  none   Subjective: Not on O2 at home-- still feeling very SOB Right hip pain not left  Objective: Vitals:   09/28/23 0411 09/28/23 0709 09/28/23 1040 09/28/23 1057  BP: (!) 103/57  (!) 130/52 (!) 117/55  Pulse: 74  96 (!) 101  Resp: 20  18 20   Temp:  97.8 F (36.6 C)     TempSrc: Oral     SpO2: 93% 97% 100% 100%  Weight: 42.5 kg     Height:        Intake/Output Summary (Last 24 hours) at 09/28/2023 1100 Last data filed at 09/28/2023 0300 Gross per 24 hour  Intake 360 ml  Output 1500 ml  Net -1140 ml   Filed Weights   09/27/23 1057 09/27/23 1300 09/28/23 0411  Weight: 41.7 kg 43.7 kg 42.5 kg    Examination:   General: Appearance:    Thin female in no acute distress     Lungs:     Diminished on left side  Heart:    Tachycardic.    MS:   All extremities are intact.    Neurologic:   Awake, alert       Data Reviewed: I have personally reviewed following labs and imaging studies  CBC: Recent Labs  Lab 09/27/23 1122 09/28/23 0532  WBC 9.2 10.0  NEUTROABS 7.3  --   HGB 10.4* 9.7*  HCT 30.0* 29.1*  MCV 92.0 94.5  PLT 514* 450*   Basic Metabolic Panel: Recent Labs  Lab 09/27/23 1122 09/28/23 0532  NA 124* 127*  K 2.8* 4.0  CL 86* 95*  CO2 24 28  GLUCOSE 136* 115*  BUN 14 14  CREATININE 0.68 0.69  CALCIUM 8.7* 7.9*  MG  --  1.9   GFR: Estimated Creatinine Clearance: 32.6 mL/min (by C-G formula based on SCr of 0.69 mg/dL). Liver Function Tests: Recent Labs  Lab 09/27/23 1122  AST 27  ALT 14  ALKPHOS 180*  BILITOT 0.7  PROT 7.2  ALBUMIN 3.5   No results for input(s): "LIPASE", "AMYLASE" in the last 168 hours. No results for input(s): "AMMONIA" in the last 168 hours. Coagulation Profile: No results for input(s): "INR", "PROTIME" in the last 168 hours. Cardiac Enzymes: No results for input(s): "CKTOTAL", "CKMB", "CKMBINDEX", "TROPONINI" in the last 168 hours. BNP (last 3 results) No results for input(s): "PROBNP" in the last 8760 hours. HbA1C: No results for input(s): "HGBA1C" in the last 72 hours. CBG: No results for input(s): "GLUCAP" in the last 168 hours. Lipid Profile: No results for input(s): "CHOL", "HDL", "LDLCALC", "TRIG", "CHOLHDL", "LDLDIRECT" in the last 72 hours. Thyroid  Function Tests: No results for input(s): "TSH", "T4TOTAL", "FREET4", "T3FREE", "THYROIDAB" in the last 72 hours. Anemia Panel: No results for input(s): "VITAMINB12", "FOLATE", "FERRITIN", "TIBC", "IRON", "RETICCTPCT" in the last 72 hours. Sepsis Labs: No results for input(s): "PROCALCITON", "LATICACIDVEN" in the last 168 hours.  No results found for this or any previous visit (from the past 240 hour(s)).       Radiology Studies: DG Chest Portable 1 View  Result Date: 09/27/2023 CLINICAL DATA:  Shortness of breath EXAM: PORTABLE CHEST 1 VIEW COMPARISON:  08/30/2023, chest CT 06/27/2023 FINDINGS: Bandlike density  in the right mid to lower lung with clips, corresponding to known lung nodule and possible post radiation changes. Small right-sided pleural effusion. Moderate left effusion is new compared to prior. Emphysema. Airspace disease at left base. Vascular congestion and possible mild interstitial edema superimposed on underlying emphysema. No pneumothorax. IMPRESSION: 1. Moderate left and small right pleural effusions, effusion on the left is new. Airspace disease at left base may be due to atelectasis or pneumonia. 2. Vascular congestion and possible mild interstitial edema superimposed on underlying emphysema. 3. Bandlike density in the right mid to lower lung with clips, corresponding to known lung nodule and possible post radiation changes. Electronically Signed   By: Jasmine Pang M.D.   On: 09/27/2023 15:32        Scheduled Meds:  guaiFENesin  600 mg Oral BID   ipratropium-albuterol  3 mL Nebulization TID   nicotine  14 mg Transdermal Daily   Continuous Infusions:   LOS: 1 day    Time spent: 45 minutes spent on chart review, discussion with nursing staff, consultants, updating family and interview/physical exam; more than 50% of that time was spent in counseling and/or coordination of care.    Joseph Art, DO [facility] Available via Epic secure chat  7am-7pm After these hours, please refer to coverage provider listed on [email or web address] 09/28/2023, 11:00 AM

## 2023-09-28 NOTE — Progress Notes (Signed)
Mobility Specialist Progress Note:    09/28/23 1000  Mobility  Activity Ambulated with assistance in hallway;Ambulated with assistance to bathroom  Level of Assistance Contact guard assist, steadying assist  Assistive Device Front wheel walker  Distance Ambulated (ft) 50 ft  Range of Motion/Exercises Active;All extremities  Activity Response Tolerated well  Mobility Referral Yes  $Mobility charge 1 Mobility  Mobility Specialist Start Time (ACUTE ONLY) 0935  Mobility Specialist Stop Time (ACUTE ONLY) 1005  Mobility Specialist Time Calculation (min) (ACUTE ONLY) 30 min   Pt received in bed, agreeable to mobility. Required CGA to stand and ambulate with RW. Tolerated well, SpO2 86% on RA at rest. Placed [place] on pt, SpO2 93% on 2L at rest. SpO2 91% on 2L during ambulation. Audible SOB and pt c/o right hip pain. Returned pt to room, left in chair. Alarm on, call bell in reach. All needs met.   Lawerance Bach Mobility Specialist Please contact via Special educational needs teacher or  Rehab office at [phone]

## 2023-09-29 DIAGNOSIS — I5031 Acute diastolic (congestive) heart failure: Secondary | ICD-10-CM | POA: Diagnosis not present

## 2023-09-29 LAB — BRAIN NATRIURETIC PEPTIDE: B Natriuretic Peptide: 576 pg/mL — ABNORMAL HIGH (ref 0.0–100.0)

## 2023-09-29 LAB — BASIC METABOLIC PANEL
Anion gap: 11 (ref 5–15)
BUN: 14 mg/dL (ref 8–23)
CO2: 26 mmol/L (ref 22–32)
Calcium: 8.4 mg/dL — ABNORMAL LOW (ref 8.9–10.3)
Chloride: 93 mmol/L — ABNORMAL LOW (ref 98–111)
Creatinine, Ser: 0.66 mg/dL (ref 0.44–1.00)
GFR, Estimated: >60 mL/min (ref >60)
Glucose, Bld: 106 mg/dL — ABNORMAL HIGH (ref 70–99)
Potassium: 3.9 mmol/L (ref 3.5–5.1)
Sodium: 130 mmol/L — ABNORMAL LOW (ref 135–145)

## 2023-09-29 LAB — CBC
HCT: 30 % — ABNORMAL LOW (ref 36.0–46.0)
Hemoglobin: 10 g/dL — ABNORMAL LOW (ref 12.0–15.0)
MCH: 31.7 pg (ref 26.0–34.0)
MCHC: 33.3 g/dL (ref 30.0–36.0)
MCV: 95.2 fL (ref 80.0–100.0)
Platelets: 449 10*3/uL — ABNORMAL HIGH (ref 150–400)
RBC: 3.15 MIL/uL — ABNORMAL LOW (ref 3.87–5.11)
RDW: 15.9 % — ABNORMAL HIGH (ref 11.5–15.5)
WBC: 7.6 10*3/uL (ref 4.0–10.5)
nRBC: 0 % (ref 0.0–0.2)

## 2023-09-29 MED ORDER — FUROSEMIDE 10 MG/ML IJ SOLN
40.0000 mg | Freq: Every day | INTRAMUSCULAR | Status: DC
  Administered 2023-09-29 – 2023-09-30 (×2): 40 mg via INTRAVENOUS
  Filled 2023-09-29 (×2): qty 4

## 2023-09-29 MED ORDER — MELATONIN 3 MG PO TABS
6.0000 mg | ORAL_TABLET | Freq: Once | ORAL | Status: AC
  Administered 2023-09-29: 6 mg via ORAL
  Filled 2023-09-29: qty 2

## 2023-09-29 MED ORDER — IPRATROPIUM-ALBUTEROL 0.5-2.5 (3) MG/3ML IN SOLN: 3.0000 mL | Freq: Four times a day (QID) | RESPIRATORY_TRACT | Status: DC | PRN

## 2023-09-29 MED ORDER — IPRATROPIUM-ALBUTEROL 0.5-2.5 (3) MG/3ML IN SOLN
3.0000 mL | Freq: Two times a day (BID) | RESPIRATORY_TRACT | Status: DC
  Administered 2023-09-29 – 2023-09-30 (×2): 3 mL via RESPIRATORY_TRACT
  Filled 2023-09-29: qty 3

## 2023-09-29 NOTE — Progress Notes (Signed)
PROGRESS NOTE    AVREE HEINERT  NWG:[id number] DOB: 12/24/1932 DOA: 09/27/2023 PCP: Elfredia Nevins, MD    Brief Narrative:  89 year old female longtime smoker with COPD but not on oxygen, HFpEF, hypertension, hyperlipidemia, right lung cancer status post radiation treatments, ileostomy, status post left hip arthroplasty 10/16 Dr. Dallas Schimke and other PMH detailed below presented to ED complaining of increasing SOB over the past 3 days.  She reports increasing edema in the legs.  She is having exertional dyspnea symptoms.  She also has orthopnea and has to sit up in a recliner to sleep at night.  She was noted to be hypoxic on arrival with a pulse ox of 83% on room air.  She was placed on 2 L nasal cannula with some improvement.  She reportedly started taking her home as needed Lasix yesterday but had poor urine output.  She denies palpitations and chest pain symptoms.  No chest pressure.  She has chronic cough.  She denies nausea and vomiting.  She says her symptoms feel similar to when she had to have a thoracentesis done several years ago due to a right pleural effusion.  Her cardiac BNP was elevated and greater than 700.  She was noted to have peripheral edema and her chest x-ray showed findings consistent with congestive heart failure and a large left pleural effusion.  IV Lasix 80 mg given in the ED.  Patient noted to have sodium of 124 and a potassium of 2.8.  Blood glucose was 136 and hemoglobin was 10.4.  Hospital admission was requested for further management of acute respiratory failure with hypoxia and Acute HFpEF.   Getting thoracentesis   Assessment and Plan: Acute HFpEF - pt appears volume overloaded with peripheral edema and crackles on chest exam - BNP elevated at 743  - IV furosemide 80 mg x 1 dose given in ED --will dose daily for now until off O2 - monitor I/O, weight and electrolytes - low sodium diet - TTE- with aortic stenosis and pHTN-- discussed with cards: LVEF vigorous with  diastolic dysfunction and moderate pulmonary hypertension which is probably WHO group 2. Would diurese her as you are and get her set up to see [place] as an outpatient.  - obtain high quality ReDs Vest reading  - further recommendations to follow response to these therapies   Acute respiratory failure with hypoxia  -  not on home oxygen  - now requiring 2L/min [place] - wean as able to RA - treating acute heart failure as noted above    Left  pleural effusion  - US thoracentesis requested  - requested fluid be sent for studies-- appears to be transudate   Hyponatremia - improved with IV lasix  Bronchogenic carcinoma in right lung - pt was treated with radiation therapy  - pt to follow up with Dr. Ellin Saba in Feb 2025 to repeat imaging   S/p left hip fracture  - s/p left hip hemiarthroplasty on 08/31/23 by Dr. Dallas Schimke - Dr. Dallas Schimke reached out to me and recommended "If she continues to have pain in the groin, we should consider getting an MRI to rule out a nondisplaced fracture" and to contact him with questions.   -pain is actually in right side of hip- x ray without obvious fracture  COPD - scheduled bronchodilators and mucinex - does not show clinical signs to be in acute exacerbation at this time   Longterm smoker - requested smoking cessation education be given to patient   Severe protein calorie  malnutrition  - regular diet ordered - dietitian expertise recommendations requested   Ileostomy - routine ileostomy care    Hyperlipidemia - resume  home statin   Microcytic anemia  - pt reports taking daily iron supplement - resume when home meds reconciled  DVT prophylaxis: SCDs Start: 09/27/23 1241    Code Status: Limited: Do not attempt resuscitation (DNR) -DNR-LIMITED -Do Not Intubate/DNI    Disposition Plan:  Level of care: Telemetry Status is: Inpatient Remains inpatient appropriate     Consultants:  none   Subjective: Feeling better  Objective: Vitals:    09/28/23 1957 09/28/23 2115 09/29/23 0347 09/29/23 0805  BP:  134/75 130/67   Pulse:  (!) 102 74   Resp:  20 18   Temp:  98 F (36.7 C)    TempSrc:  Oral    SpO2: 95% 93% 93% 97%  Weight:   42.5 kg   Height:        Intake/Output Summary (Last 24 hours) at 09/29/2023 1342 Last data filed at 09/29/2023 1256 Gross per 24 hour  Intake 600 ml  Output 600 ml  Net 0 ml   Filed Weights   09/27/23 1300 09/28/23 0411 09/29/23 0347  Weight: 43.7 kg 42.5 kg 42.5 kg    Examination:    General: Appearance:    Thin female in no acute distress     Lungs:      respirations unlabored  Heart:    Normal heart rate.  MS:   All extremities are intact.   Neurologic:   Awake, alert       Data Reviewed: I have personally reviewed following labs and imaging studies  CBC: Recent Labs  Lab 09/27/23 1122 09/28/23 0532 09/29/23 0504  WBC 9.2 10.0 7.6  NEUTROABS 7.3  --   --   HGB 10.4* 9.7* 10.0*  HCT 30.0* 29.1* 30.0*  MCV 92.0 94.5 95.2  PLT 514* 450* 449*   Basic Metabolic Panel: Recent Labs  Lab 09/27/23 1122 09/28/23 0532 09/29/23 0504  NA 124* 127* 130*  K 2.8* 4.0 3.9  CL 86* 95* 93*  CO2 24 28 26   GLUCOSE 136* 115* 106*  BUN 14 14 14   CREATININE 0.68 0.69 0.66  CALCIUM 8.7* 7.9* 8.4*  MG  --  1.9  --    GFR: Estimated Creatinine Clearance: 32.6 mL/min (by C-G formula based on SCr of 0.66 mg/dL). Liver Function Tests: Recent Labs  Lab 09/27/23 1122  AST 27  ALT 14  ALKPHOS 180*  BILITOT 0.7  PROT 7.2  ALBUMIN 3.5   No results for input(s): "LIPASE", "AMYLASE" in the last 168 hours. No results for input(s): "AMMONIA" in the last 168 hours. Coagulation Profile: No results for input(s): "INR", "PROTIME" in the last 168 hours. Cardiac Enzymes: No results for input(s): "CKTOTAL", "CKMB", "CKMBINDEX", "TROPONINI" in the last 168 hours. BNP (last 3 results) No results for input(s): "PROBNP" in the last 8760 hours. HbA1C: No results for input(s): "HGBA1C"  in the last 72 hours. CBG: No results for input(s): "GLUCAP" in the last 168 hours. Lipid Profile: No results for input(s): "CHOL", "HDL", "LDLCALC", "TRIG", "CHOLHDL", "LDLDIRECT" in the last 72 hours. Thyroid Function Tests: No results for input(s): "TSH", "T4TOTAL", "FREET4", "T3FREE", "THYROIDAB" in the last 72 hours. Anemia Panel: No results for input(s): "VITAMINB12", "FOLATE", "FERRITIN", "TIBC", "IRON", "RETICCTPCT" in the last 72 hours. Sepsis Labs: No results for input(s): "PROCALCITON", "LATICACIDVEN" in the last 168 hours.  No results found for this or any previous  visit (from the past 240 hour(s)).       Radiology Studies: ECHOCARDIOGRAM COMPLETE  Result Date: 09/28/2023    ECHOCARDIOGRAM REPORT   Patient Name:   EVADENE CAPPARELLI Date of Exam: 09/28/2023 Medical Rec #:  [id number]    Height:       61.0 in Accession #:    [id number]   Weight:       93.7 lb Date of Birth:  09-Feb-1933     BSA:          1.368 m Patient Age:    89 years     BP:           117/55 mmHg Patient Gender: F            HR:           79 bpm. Exam [facility] Procedure: 2D Echo, Cardiac Doppler and Color Doppler Indications:    CHF- Acute Diastolic I50.31  History:        Patient has prior history of Echocardiogram examinations, most                 recent 05/12/2022. CHF, COPD; Risk Factors:Hypertension,                 Dyslipidemia and Current Smoker.  Sonographer:    Lucendia Herrlich RCS Referring Phys: [id number] Cleora Fleet  Sonographer Comments: Image acquisition challenging due to uncooperative patient. IMPRESSIONS  1. Left ventricular ejection fraction, by estimation, is 65 to 70%. The left ventricle has normal function. The left ventricle has no regional wall motion abnormalities. There is mild asymmetric left ventricular hypertrophy of the basal segment. Left ventricular diastolic parameters are indeterminate.  2. Right ventricular systolic function is normal. The right ventricular size is normal.  There is moderately elevated pulmonary artery systolic pressure. The estimated right ventricular systolic pressure is 51.2 mmHg.  3. Left atrial size was moderately dilated.  4. Possible small left pleural effusion noted.  5. The mitral valve is degenerative. Mild to moderate mitral valve regurgitation. Mild mitral stenosis. The mean mitral valve gradient is 6.0 mmHg.  6. The aortic valve is abnormal, possibly functionally bicuspid and severely calcified. Aortic valve regurgitation is mild. Moderate to severe aortic valve stenosis, upper end of scale. Aortic valve mean gradient measures 36.5 mmHg. Aortic valve Vmax measures 4.07 m/s.  7. The inferior vena cava is dilated in size with <50% respiratory variability, suggesting right atrial pressure of 15 mmHg. Comparison(s): Prior images reviewed side by side. LVEF remains vigorous at 65-70%. Moderate to severe aortic stenosis (upper end of scale0 with mean AV gradient 36.5 mmHg). Mild mitral stenosis with mild to moderate mitral regurgitation. FINDINGS  Left Ventricle: Left ventricular ejection fraction, by estimation, is 65 to 70%. The left ventricle has normal function. The left ventricle has no regional wall motion abnormalities. The left ventricular internal cavity size was normal in size. There is  mild asymmetric left ventricular hypertrophy of the basal segment. Left ventricular diastolic function could not be evaluated due to mitral annular calcification (moderate or greater). Left ventricular diastolic parameters are indeterminate. Right Ventricle: The right ventricular size is normal. No increase in right ventricular wall thickness. Right ventricular systolic function is normal. There is moderately elevated pulmonary artery systolic pressure. The tricuspid regurgitant velocity is 3.01 m/s, and with an assumed right atrial pressure of 15 mmHg, the estimated right ventricular systolic pressure is 51.2 mmHg. Left Atrium: Left atrial size was moderately dilated.  Right Atrium: Right atrial size was normal in size. Pericardium: Possible small left pleural effusion noted. There is no evidence of pericardial effusion. Mitral Valve: The mitral valve is degenerative in appearance. There is mild thickening of the mitral valve leaflet(s). Mild to moderate mitral annular calcification. Mild to moderate mitral valve regurgitation, with posteriorly-directed jet. Mild mitral valve stenosis. MV peak gradient, 18.3 mmHg. The mean mitral valve gradient is 6.0 mmHg. Tricuspid Valve: The tricuspid valve is grossly normal. Tricuspid valve regurgitation is mild. Aortic Valve: The aortic valve is abnormal. There is severe calcifcation of the aortic valve. Aortic valve regurgitation is mild. Aortic regurgitation PHT measures 214 msec. Moderate to severe aortic stenosis is present. Aortic valve mean gradient measures 36.5 mmHg. Aortic valve peak gradient measures 66.3 mmHg. Pulmonic Valve: The pulmonic valve was grossly normal. Pulmonic valve regurgitation is trivial. Aorta: The aortic root is normal in size and structure. Venous: The inferior vena cava is dilated in size with less than 50% respiratory variability, suggesting right atrial pressure of 15 mmHg. IAS/Shunts: No atrial level shunt detected by color flow Doppler.  LEFT VENTRICLE PLAX 2D LVIDd:         3.60 cm   Diastology LVIDs:         2.30 cm   LV e' medial:    3.81 cm/s LV PW:         1.00 cm   LV E/e' medial:  34.1 LV IVS:        1.20 cm   LV e' lateral:   4.13 cm/s LVOT diam:     1.80 cm   LV E/e' lateral: 31.5 LVOT Area:     2.54 cm  RIGHT VENTRICLE             IVC RV S prime:     10.60 cm/s  IVC diam: 2.40 cm TAPSE (M-mode): 2.0 cm LEFT ATRIUM             Index        RIGHT ATRIUM          Index LA diam:        3.10 cm 2.27 cm/m   RA Area:     9.70 cm LA Vol (A2C):   31.5 ml 23.02 ml/m  RA Volume:   18.20 ml 13.30 ml/m LA Vol (A4C):   54.5 ml 39.83 ml/m LA Biplane Vol: 42.1 ml 30.77 ml/m  AORTIC VALVE AV Vmax:       407.00 cm/s AV Vmean:     280.500 cm/s AV VTI:       0.804 m AV Peak Grad: 66.3 mmHg AV Mean Grad: 36.5 mmHg AI PHT:       214 msec  AORTA Ao Root diam: 2.90 cm Ao Asc diam:  3.30 cm MITRAL VALVE                TRICUSPID VALVE MV Area (PHT): 3.48 cm     TR Peak grad:   36.2 mmHg MV Peak grad:  18.3 mmHg    TR Vmax:        301.00 cm/s MV Mean grad:  6.0 mmHg MV Vmax:       2.14 m/s     SHUNTS MV Vmean:      112.0 cm/s   Systemic Diam: 1.80 cm MV Decel Time: 218 msec MR Peak grad: 137.8 mmHg MR Mean grad: 91.0 mmHg MR Vmax:      587.00 cm/s MR Vmean:     450.0  cm/s MV E velocity: 130.00 cm/s MV A velocity: 101.00 cm/s MV E/A ratio:  1.29 Nona Dell MD Electronically signed by Nona Dell MD Signature Date/Time: 09/28/2023/5:22:31 PM    Final    DG HIP UNILAT WITH PELVIS 2-3 VIEWS RIGHT  Result Date: 09/28/2023 CLINICAL DATA:  [id number] Groin pain [id number] EXAM: DG HIP (WITH OR WITHOUT PELVIS) 2-3V RIGHT COMPARISON:  Pelvic XR, 09/01/2023 and 11/20/2021. CT hip 08/30/2023. FINDINGS: Postoperative change of LEFT hip arthroplasty, in anatomic alignment. No periprosthetic lucency or fracture. Mild, relative degenerative change otherwise normal appearance of the RIGHT hip without fracture or dislocation. Vascular calcifications. IMPRESSION: 1. No acute displaced fracture, dislocation or significant degenerative change within the RIGHT hip. 2. LEFT hip arthroplasty in anatomic alignment. No periprosthetic lucency or fracture. Electronically Signed   By: Roanna Banning M.D.   On: 09/28/2023 12:26   US THORACENTESIS ASP PLEURAL SPACE W/IMG GUIDE  Result Date: 09/28/2023 INDICATION: Patient with history of COPD, CHF, right lung cancer s/p radiation admitted with acute CHF exacerbation and found to have new left pleural effusion. Request for diagnostic and therapeutic left thoracentesis. EXAM: ULTRASOUND GUIDED LEFT THORACENTESIS MEDICATIONS: 6 mL 1% lidocaine COMPLICATIONS: None immediate. PROCEDURE: An ultrasound  guided thoracentesis was thoroughly discussed with the patient and questions answered. The benefits, risks, alternatives and complications were also discussed. The patient understands and wishes to proceed with the procedure. Written consent was obtained. Ultrasound was performed to localize and mark an adequate pocket of fluid in the left chest. The area was then prepped and draped in the normal sterile fashion. 1% Lidocaine was used for local anesthesia. Under ultrasound guidance a 6 Fr Safe-T-Centesis catheter was introduced. Thoracentesis was performed. The catheter was removed and a dressing applied. FINDINGS: A total of approximately 850 mL of clear yellow fluid was removed. Samples were sent to the laboratory as requested by the clinical team. IMPRESSION: Successful ultrasound guided diagnostic and therapeutic LEFT thoracentesis yielding 850 mL of pleural fluid. Performed by Lynnette Caffey, PA-C Electronically Signed   By: Roanna Banning M.D.   On: 09/28/2023 12:20   DG Chest 1 View  Result Date: 09/28/2023 CLINICAL DATA:  [id number] Status post thoracentesis [id number] EXAM: PORTABLE CHEST 1 VIEW COMPARISON:  Chest XR, 09/27/2023 and 08/30/2023. IR thoracentesis, earlier same day. CT chest, 06/27/2023. FINDINGS: The cardiac apex is obscured. Aortic arch atherosclerosis. Emphysematous lung change, best appreciated at the apices. Mildly improved aeration of the LEFT chest post thoracentesis without pneumothorax. Small volume residual LEFT pleural effusion with blunting of the diaphragm, associated retrocardiac and basilar opacity is unchanged. Similar degree of aeration of the RIGHT lung with nodular opacity and surgical clip similar degree of RIGHT basilar pleural thickening. No acute osseous abnormality. IMPRESSION: 1. No pneumothorax or significant residual pleural effusion post LEFT thoracentesis. 2. Similar pulmonary findings with emphysematous lung change, RIGHT mid lung nodular opacity and LEFT basilar  consolidation. Electronically Signed   By: Roanna Banning M.D.   On: 09/28/2023 11:20        Scheduled Meds:  ferrous sulfate  325 mg Oral Q breakfast   furosemide  40 mg Intravenous Daily   guaiFENesin  600 mg Oral BID   ipratropium-albuterol  3 mL Nebulization TID   metoprolol tartrate  12.5 mg Oral BID   mometasone-formoterol  2 puff Inhalation BID   nicotine  14 mg Transdermal Daily   pravastatin  10 mg Oral Daily   Continuous Infusions:   LOS: 2 days    Time spent:  45 minutes spent on chart review, discussion with nursing staff, consultants, updating family and interview/physical exam; more than 50% of that time was spent in counseling and/or coordination of care.    Joseph Art, DO [facility] Available via Epic secure chat 7am-7pm After these hours, please refer to coverage provider listed on [email or web address] 09/29/2023, 1:42 PM

## 2023-09-30 DIAGNOSIS — I5031 Acute diastolic (congestive) heart failure: Secondary | ICD-10-CM | POA: Diagnosis not present

## 2023-09-30 LAB — BASIC METABOLIC PANEL
Anion gap: 10 (ref 5–15)
BUN: 19 mg/dL (ref 8–23)
CO2: 27 mmol/L (ref 22–32)
Calcium: 8.4 mg/dL — ABNORMAL LOW (ref 8.9–10.3)
Chloride: 94 mmol/L — ABNORMAL LOW (ref 98–111)
Creatinine, Ser: 0.72 mg/dL (ref 0.44–1.00)
GFR, Estimated: >60 mL/min (ref >60)
Glucose, Bld: 99 mg/dL (ref 70–99)
Potassium: 3.8 mmol/L (ref 3.5–5.1)
Sodium: 131 mmol/L — ABNORMAL LOW (ref 135–145)

## 2023-09-30 LAB — PATHOLOGIST SMEAR REVIEW

## 2023-09-30 MED ORDER — OXYCODONE HCL 5 MG PO TABS: 2.5000 mg | ORAL_TABLET | Freq: Four times a day (QID) | ORAL | 0 refills | Status: DC | PRN

## 2023-09-30 MED ORDER — NICOTINE 14 MG/24HR TD PT24: 14.0000 mg | MEDICATED_PATCH | Freq: Every day | TRANSDERMAL | 0 refills | Status: AC

## 2023-09-30 MED ORDER — FLUTICASONE-SALMETEROL 100-50 MCG/ACT IN AEPB: 1.0000 | INHALATION_SPRAY | Freq: Two times a day (BID) | RESPIRATORY_TRACT | 3 refills | Status: AC

## 2023-09-30 MED ORDER — GUAIFENESIN ER 600 MG PO TB12: 600.0000 mg | ORAL_TABLET | Freq: Two times a day (BID) | ORAL | 0 refills | Status: AC

## 2023-09-30 MED ORDER — FUROSEMIDE 20 MG PO TABS
20.0000 mg | ORAL_TABLET | Freq: Every day | ORAL | 1 refills | Status: DC
Start: 2023-09-30 — End: 2023-10-21

## 2023-09-30 NOTE — Discharge Summary (Signed)
Kathleen Pope, is a 89 y.o. female  DOB [birth year]  MRN [id number].  Admission date:  09/27/2023  Admitting Physician  Cleora Fleet, MD  Discharge Date:  09/30/2023   Primary MD  Elfredia Nevins, MD  Recommendations for primary care physician for things to follow:  1)Follow up with cardiologist Dr. Wyline Mood or Randall An in 1 to 2 weeks for recheck and reevaluation  2)Follow up with oncologist Dr. Ellin Saba as previously scheduled for right lung cancer 3)Follow-up with your orthopedic surgeon Dr. Dallas Schimke if hip pain persist 4) repeat CBC and BMP blood test within a week advised 5) abstinence from tobacco advised--okay to use nicotine patch to help you quit smoking 6)Very low-salt diet advised--less than 2 g of sodium per day advised 7)Weigh yourself daily, call if you gain more than 3 pounds in 1 day or more than 5 pounds in 1 week as your diuretic medications may need to be adjusted  Admission Diagnosis  Hypokalemia [E87.6] Hyponatremia [E87.1] Respiratory distress [R06.03] Acute congestive heart failure, unspecified heart failure type (HCC) [I50.9] Acute heart failure with preserved ejection fraction (HFpEF) (HCC) [I50.31]  Discharge Diagnosis  Hypokalemia [E87.6] Hyponatremia [E87.1] Respiratory distress [R06.03] Acute congestive heart failure, unspecified heart failure type (HCC) [I50.9] Acute heart failure with preserved ejection fraction (HFpEF) (HCC) [I50.31]    Principal Problem:   Acute heart failure with preserved ejection fraction (HFpEF) (HCC) Active Problems:   Acute respiratory failure with hypoxia (HCC)   Mixed hyperlipidemia   Cigarette smoker   Murmur   HTN (hypertension)   Pleural effusion on left   Protein-calorie malnutrition, severe   Ileostomy in place [facility])   Hyponatremia   Bronchogenic cancer of right lung (HCC)   COPD (chronic obstructive pulmonary disease) (HCC)    S/p left hip fracture   Hypokalemia   Elevated brain natriuretic peptide (BNP) level   Microcytic anemia   DNR (do not resuscitate)      Past Medical History:  Diagnosis Date   Acute blood loss anemia 08/18/2012   Acute hypoxic respiratory failure (HCC) 05/12/2022   Anemia    pt denies, yet takes iron   AVM (arteriovenous malformation) of colon 08/18/2012   Benign neoplasm of ascending colon    Benign neoplasm of cecum    Cavitating mass in right upper lung lobe 01/02/2023   Chronic airway obstruction, not elsewhere classified    Complication of anesthesia    COPD (chronic obstructive pulmonary disease) (HCC)    De Quervain's disease (tenosynovitis) 03/14/2014   Diverticulosis    External hemorrhoids 03/08/2013   GI hemorrhage from post-treatment cecal ulcer 08/18/2012   Heart murmur    systolic with bil rad into carotids   Hemorrhoids    History of radiation therapy    5 cylces stereotactic body radiotherapy   HTN (hypertension)    Hypercholesteremia    Hyponatremia 07/19/2023   Lower GI bleed multiple   AVM's   Lung cancer (HCC) 02/14/2023   Lung mass 02/05/2023   Oxygen deficiency    PONV (postoperative nausea and  vomiting)    Pressure injury of skin 07/19/2023   Prolonged QT interval 08/30/2023   Severe sepsis (HCC) 08/30/2023    Past Surgical History:  Procedure Laterality Date   BRONCHIAL BIOPSY  02/14/2023   Procedure: BRONCHIAL BIOPSIES;  Surgeon: Leslye Peer, MD;  Location: [facility] ENDOSCOPY;  Service: Pulmonary;;   BRONCHIAL BRUSHINGS  02/14/2023   Procedure: BRONCHIAL BRUSHINGS;  Surgeon: Leslye Peer, MD;  Location: [facility] ENDOSCOPY;  Service: Pulmonary;;   BRONCHIAL NEEDLE ASPIRATION BIOPSY  02/14/2023   Procedure: BRONCHIAL NEEDLE ASPIRATION BIOPSIES;  Surgeon: Leslye Peer, MD;  Location: [facility] ENDOSCOPY;  Service: Pulmonary;;   BRONCHIAL WASHINGS  02/14/2023   Procedure: BRONCHIAL WASHINGS;  Surgeon: Leslye Peer, MD;  Location: [facility] ENDOSCOPY;   Service: Pulmonary;;   BUNIONECTOMY     left   COLECTOMY     COLON RESECTION N/A 01/17/2021   Procedure: EXPLORATORY LAPAROTOMY COMPLETION COLECTOMY ileostomy;  Surgeon: Axel Filler, MD;  Location: [facility] ORS;  Service: General;  Laterality: N/A;   COLONOSCOPY  08/10/2012   Procedure: COLONOSCOPY;  Surgeon: Meryl Dare, MD,FACG;  Location: [facility] ENDOSCOPY;  Service: Endoscopy;  Laterality: N/A;   COLONOSCOPY  08/19/2012   Procedure: COLONOSCOPY;  Surgeon: Iva Boop, MD;  Location: [facility] ENDOSCOPY;  Service: Endoscopy;  Laterality: N/A;   COLONOSCOPY N/A 03/11/2013   Procedure: COLONOSCOPY;  Surgeon: Hilarie Fredrickson, MD;  Location: [facility] ENDOSCOPY;  Service: Endoscopy;  Laterality: N/A;   COLONOSCOPY N/A 01/16/2021   Procedure: COLONOSCOPY;  Surgeon: Sherrilyn Rist, MD;  Location: [facility] ENDOSCOPY;  Service: Gastroenterology;  Laterality: N/A;   COLONOSCOPY WITH PROPOFOL N/A 01/10/2021   Procedure: COLONOSCOPY WITH PROPOFOL;  Surgeon: Hilarie Fredrickson, MD;  Location: [facility] ENDOSCOPY;  Service: Endoscopy;  Laterality: N/A;   COLOSTOMY N/A 01/15/2021   Procedure: COLOSTOMY;  Surgeon: Harriette Bouillon, MD;  Location: [facility] ORS;  Service: General;  Laterality: N/A;   ESOPHAGOGASTRODUODENOSCOPY (EGD) WITH PROPOFOL N/A 01/14/2021   Procedure: ESOPHAGOGASTRODUODENOSCOPY (EGD) WITH PROPOFOL;  Surgeon: Sherrilyn Rist, MD;  Location: [facility] ENDOSCOPY;  Service: Endoscopy;  Laterality: N/A;   FIDUCIAL MARKER PLACEMENT  02/14/2023   Procedure: FIDUCIAL MARKER PLACEMENT;  Surgeon: Leslye Peer, MD;  Location: [facility] ENDOSCOPY;  Service: Pulmonary;;   FLEXIBLE SIGMOIDOSCOPY N/A 01/14/2021   Procedure: Arnell Sieving;  Surgeon: Sherrilyn Rist, MD;  Location: [facility] ENDOSCOPY;  Service: Endoscopy;  Laterality: N/A;   HIP ARTHROPLASTY Left 08/31/2023   Procedure: ARTHROPLASTY BIPOLAR HIP (HEMIARTHROPLASTY);  Surgeon: Oliver Barre, MD;  Location: [facility] ORS;  Service: Orthopedics;  Laterality: Left;   HOT HEMOSTASIS  N/A 01/10/2021   Procedure: HOT HEMOSTASIS (ARGON PLASMA COAGULATION/BICAP);  Surgeon: Hilarie Fredrickson, MD;  Location: [facility] ENDOSCOPY;  Service: Endoscopy;  Laterality: N/A;   IR ANGIOGRAM SELECTIVE EACH ADDITIONAL VESSEL  01/12/2021   IR ANGIOGRAM SELECTIVE EACH ADDITIONAL VESSEL  01/12/2021   IR ANGIOGRAM SELECTIVE EACH ADDITIONAL VESSEL  01/12/2021   IR ANGIOGRAM VISCERAL SELECTIVE  01/09/2021   IR ANGIOGRAM VISCERAL SELECTIVE  01/12/2021   IR EMBO ARTERIAL NOT HEMORR HEMANG INC GUIDE ROADMAPPING  01/12/2021   IR US GUIDE VASC ACCESS RIGHT  01/09/2021   IR US GUIDE VASC ACCESS RIGHT  01/12/2021   JOINT REPLACEMENT     PARTIAL COLECTOMY N/A 01/15/2021   Procedure: PARTIAL COLECTOMY;  Surgeon: Harriette Bouillon, MD;  Location: [facility] ORS;  Service: General;  Laterality: N/A;   POLYPECTOMY  01/10/2021   Procedure: POLYPECTOMY;  Surgeon: Hilarie Fredrickson, MD;  Location: [facility] ENDOSCOPY;  Service: Endoscopy;;   SKIN CANCER EXCISION Right 12/2022   Right Leg above knee   TONSILLECTOMY     VIDEO BRONCHOSCOPY WITH ENDOBRONCHIAL ULTRASOUND Bilateral 02/14/2023   Procedure: VIDEO BRONCHOSCOPY WITH ENDOBRONCHIAL ULTRASOUND;  Surgeon: Leslye Peer, MD;  Location: [facility] ENDOSCOPY;  Service: Pulmonary;  Laterality: Bilateral;   VIDEO BRONCHOSCOPY WITH RADIAL ENDOBRONCHIAL ULTRASOUND  02/14/2023   Procedure: VIDEO BRONCHOSCOPY WITH RADIAL ENDOBRONCHIAL ULTRASOUND;  Surgeon: Leslye Peer, MD;  Location: [facility] ENDOSCOPY;  Service: Pulmonary;;      HPI  from the history and physical done on the day of admission:   Chief Complaint: SOB    HPI: Kathleen Pope is a 89 year old female longtime smoker with COPD but not on oxygen, HFpEF, hypertension, hyperlipidemia, right lung cancer status post radiation treatments, ileostomy, status post left hip arthroplasty 10/16 Dr. Dallas Schimke and other PMH detailed below presented to ED complaining of increasing SOB over the past 3 days.  She reports increasing edema in the legs.  She is  having exertional dyspnea symptoms.  She also has orthopnea and has to sit up in a recliner to sleep at night.  She was noted to be hypoxic on arrival with a pulse ox of 83% on room air.  She was placed on 2 L nasal cannula with some improvement.  She reportedly started taking her home as needed Lasix yesterday but had poor urine output.  She denies palpitations and chest pain symptoms.  No chest pressure.  She has chronic cough.  She denies nausea and vomiting.  She says her symptoms feel similar to when she had to have a thoracentesis done several years ago due to a right pleural effusion.  Her cardiac BNP was elevated and greater than 700.  She was noted to have peripheral edema and her chest x-ray showed findings consistent with congestive heart failure and a large left pleural effusion.  IV Lasix 80 mg given in the ED.  Patient noted to have sodium of 124 and a potassium of 2.8.  Blood glucose was 136 and hemoglobin was 10.4.  Hospital admission was requested for further management of acute respiratory failure with hypoxia and Acute HFpEF.       Hospital Course:     1)Acute HFpEF/Diastolic Dysfunction CHF with left-sided pleural effusion -Admitted with volume overloaded with peripheral edema and crackles on chest exam - BNP elevated at 743  -Echo from 09/04/2023 with EF of 65 to 70% and moderate pulmonary hypertension as well as moderate to severe aortic stenosis-which is probably WHO group 2.  -Treated with IV Lasix -Status post- thoracentesis on 09/28/2023 with removal of 850 mL of clear fluid from left pleural space --Left pleural fluid by lights criteria (protein) consistent with transudate most likely due to dCHF -Clinically much improved -Weaned off oxygen completely  high quality ReDs Vest reading 22 -PTA patient was not on diuretics, okay to discharge on Lasix 20 mg daily -Continue metoprolol -Outpatient follow-up cardiology advised  2)Acute respiratory failure with hypoxia  -   -Due to #1 above -Successfully weaned off oxygen after thoracentesis and diuresis -O2 sats 95 to 97% on room air at this time    3)HypoNatremia--- chronic hyponatremia currently stable -Suspect some degree of SIADH in the setting of underlying lung cancer -Repeat BMP as outpatient   4)Bronchogenic carcinoma in Right lLung - pt was treated with radiation therapy  - pt to follow up with Dr. Ellin Saba in Feb 2025 to repeat imaging  5)S/p left hip fracture  - s/p left hip hemiarthroplasty on 08/31/23 by Dr. Dallas Schimke - Dr. Dallas Schimke reached out to me and recommended "If she continues to have pain in the groin, we should consider getting an MRI to rule out a nondisplaced fracture" and to contact him with questions.   -pain is actually in right side of hip- x ray without obvious fracture -Follow-up with orthopedic surgery as outpatient if pain persist   6)COPD -Continue PTA bronchodilators   7) tobacco abuse-- Smoking cessation counseling for 4 minutes today, use nicotine patch I have discussed tobacco cessation with the patient.  I have counseled the patient regarding the negative impacts of continued tobacco use including but not limited to lung cancer, COPD, and cardiovascular disease.  I have discussed alternatives to tobacco and modalities that may help facilitate tobacco cessation including but not limited to biofeedback, hypnosis, and medications.  Total time spent with tobacco counseling was 4 minutes.  8)Ileostomy - routine ileostomy care    9)Chronic Microcytic anemia  - pt reports taking daily iron supplement -Hgb currently stable  -repeat CBC as outpatient within a week advised   Discharge Condition: stable  Follow UP   Follow-up Information     [place] [place] Follow up.   Why: Home health will call to schedule your next home visit. Contact information: [address] [place] [address] [phone]         Antoine Poche, MD.  Schedule an appointment as soon as possible for a visit in 1 week(s).   Specialty: Cardiology Contact information: [address] [place] [address] [phone]         [facility] [place], [place]. Schedule an appointment as soon as possible for a visit in 1 week(s).   Specialties: Cardiology, Cardiology Contact information: [address] [place] [address] [phone]                 Diet and Activity recommendation:  As advised  Discharge Instructions    Discharge Instructions     Call MD for:  difficulty breathing, headache or visual disturbances   Complete by: As directed    Call MD for:  persistant dizziness or light-headedness   Complete by: As directed    Call MD for:  persistant nausea and vomiting   Complete by: As directed    Call MD for:  temperature >100.4   Complete by: As directed    Diet - low sodium heart healthy   Complete by: As directed    Discharge instructions   Complete by: As directed    1)Follow up with cardiologist Dr. Wyline Mood or Randall An in 1 to 2 weeks for recheck and reevaluation  2)Follow up with oncologist Dr. Ellin Saba as previously scheduled for right lung cancer 3)Follow-up with your orthopedic surgeon Dr. Dallas Schimke if hip pain persist 4) repeat CBC and BMP blood test within a week advised 5) abstinence from tobacco advised--okay to use nicotine patch to help you quit smoking 6)Very low-salt diet advised--less than 2 g of sodium per day advised 7)Weigh yourself daily, call if you gain more than 3 pounds in 1 day or more than 5 pounds in 1 week as your diuretic medications may need to be adjusted   Discharge wound care:   Complete by: As directed    As above   Increase activity slowly   Complete by: As directed        Discharge Medications  Allergies as of 09/30/2023   No Known Allergies      Medication List     STOP taking these medications    amLODipine 5 MG tablet Commonly known as: NORVASC        TAKE these medications    acetaminophen 500 MG tablet Commonly known as: TYLENOL Take 2 tablets (1,000 mg total) by mouth every 6 (six) hours as needed for moderate pain or mild pain.   ECHINACEA PO Take 167 mg by mouth daily.   Ferrous Sulfate 27 MG Tabs Take 54 mg by mouth daily with breakfast.   Fish Oil 1000 MG Caps Take 1,000 Units by mouth daily.   fluticasone-salmeterol 100-50 MCG/ACT Aepb Commonly known as: ADVAIR Inhale 1 puff into the lungs 2 (two) times daily.   furosemide 20 MG tablet Commonly known as: Lasix Take 1 tablet (20 mg total) by mouth daily.   guaiFENesin 600 MG 12 hr tablet Commonly known as: MUCINEX Take 1 tablet (600 mg total) by mouth 2 (two) times daily for 10 days.   metoprolol tartrate 25 MG tablet Commonly known as: LOPRESSOR Take 0.5 tablets (12.5 mg total) by mouth 2 (two) times daily.   multivitamin with minerals tablet Take 1 tablet by mouth daily. Centrum Silver   nicotine 14 mg/24hr patch Commonly known as: NICODERM CQ - dosed in mg/24 hours Place 1 patch (14 mg total) onto the skin daily. Start taking on: October 01, 2023   oxyCODONE 5 MG immediate release tablet Commonly known as: Oxy IR/ROXICODONE Take 0.5 tablets (2.5 mg total) by mouth every 6 (six) hours as needed for moderate pain (pain score 4-6).   Potassium 99 MG Tabs Take 1 tablet (99 mg total) by mouth daily at 2 PM.   pravastatin 10 MG tablet Commonly known as: PRAVACHOL Take 1 tablet (10 mg total) by mouth daily.               Discharge Care Instructions  (From admission, onward)           Start     Ordered   09/30/23 0000  Discharge wound care:       Comments: As above   09/30/23 1334           Major procedures and Radiology Reports - PLEASE review detailed and final reports for all details, in brief -   ECHOCARDIOGRAM COMPLETE  Result Date: 09/28/2023    ECHOCARDIOGRAM REPORT   Patient Name:   Kathleen Pope Date of Exam:  09/28/2023 Medical Rec #:  [id number]    Height:       61.0 in Accession #:    [id number]   Weight:       93.7 lb Date of Birth:  1933-07-09     BSA:          1.368 m Patient Age:    89 years     BP:           117/55 mmHg Patient Gender: F            HR:           79 bpm. [facility] Procedure: 2D Echo, Cardiac Doppler and Color Doppler Indications:    CHF- Acute Diastolic I50.31  History:        Patient has prior history of Echocardiogram examinations, most                 recent 05/12/2022. CHF, COPD; Risk Factors:Hypertension,  Dyslipidemia and Current Smoker.  Sonographer:    Lucendia Herrlich RCS Referring Phys: [id number] Cleora Fleet  Sonographer Comments: Image acquisition challenging due to uncooperative patient. IMPRESSIONS  1. Left ventricular ejection fraction, by estimation, is 65 to 70%. The left ventricle has normal function. The left ventricle has no regional wall motion abnormalities. There is mild asymmetric left ventricular hypertrophy of the basal segment. Left ventricular diastolic parameters are indeterminate.  2. Right ventricular systolic function is normal. The right ventricular size is normal. There is moderately elevated pulmonary artery systolic pressure. The estimated right ventricular systolic pressure is 51.2 mmHg.  3. Left atrial size was moderately dilated.  4. Possible small left pleural effusion noted.  5. The mitral valve is degenerative. Mild to moderate mitral valve regurgitation. Mild mitral stenosis. The mean mitral valve gradient is 6.0 mmHg.  6. The aortic valve is abnormal, possibly functionally bicuspid and severely calcified. Aortic valve regurgitation is mild. Moderate to severe aortic valve stenosis, upper end of scale. Aortic valve mean gradient measures 36.5 mmHg. Aortic valve Vmax measures 4.07 m/s.  7. The inferior vena cava is dilated in size with <50% respiratory variability, suggesting right atrial pressure of 15 mmHg. Comparison(s):  Prior images reviewed side by side. LVEF remains vigorous at 65-70%. Moderate to severe aortic stenosis (upper end of scale0 with mean AV gradient 36.5 mmHg). Mild mitral stenosis with mild to moderate mitral regurgitation. FINDINGS  Left Ventricle: Left ventricular ejection fraction, by estimation, is 65 to 70%. The left ventricle has normal function. The left ventricle has no regional wall motion abnormalities. The left ventricular internal cavity size was normal in size. There is  mild asymmetric left ventricular hypertrophy of the basal segment. Left ventricular diastolic function could not be evaluated due to mitral annular calcification (moderate or greater). Left ventricular diastolic parameters are indeterminate. Right Ventricle: The right ventricular size is normal. No increase in right ventricular wall thickness. Right ventricular systolic function is normal. There is moderately elevated pulmonary artery systolic pressure. The tricuspid regurgitant velocity is 3.01 m/s, and with an assumed right atrial pressure of 15 mmHg, the estimated right ventricular systolic pressure is 51.2 mmHg. Left Atrium: Left atrial size was moderately dilated. Right Atrium: Right atrial size was normal in size. Pericardium: Possible small left pleural effusion noted. There is no evidence of pericardial effusion. Mitral Valve: The mitral valve is degenerative in appearance. There is mild thickening of the mitral valve leaflet(s). Mild to moderate mitral annular calcification. Mild to moderate mitral valve regurgitation, with posteriorly-directed jet. Mild mitral valve stenosis. MV peak gradient, 18.3 mmHg. The mean mitral valve gradient is 6.0 mmHg. Tricuspid Valve: The tricuspid valve is grossly normal. Tricuspid valve regurgitation is mild. Aortic Valve: The aortic valve is abnormal. There is severe calcifcation of the aortic valve. Aortic valve regurgitation is mild. Aortic regurgitation PHT measures 214 msec. Moderate to  severe aortic stenosis is present. Aortic valve mean gradient measures 36.5 mmHg. Aortic valve peak gradient measures 66.3 mmHg. Pulmonic Valve: The pulmonic valve was grossly normal. Pulmonic valve regurgitation is trivial. Aorta: The aortic root is normal in size and structure. Venous: The inferior vena cava is dilated in size with less than 50% respiratory variability, suggesting right atrial pressure of 15 mmHg. IAS/Shunts: No atrial level shunt detected by color flow Doppler.  LEFT VENTRICLE PLAX 2D LVIDd:         3.60 cm   Diastology LVIDs:         2.30 cm  LV e' medial:    3.81 cm/s LV PW:         1.00 cm   LV E/e' medial:  34.1 LV IVS:        1.20 cm   LV e' lateral:   4.13 cm/s LVOT diam:     1.80 cm   LV E/e' lateral: 31.5 LVOT Area:     2.54 cm  RIGHT VENTRICLE             IVC RV S prime:     10.60 cm/s  IVC diam: 2.40 cm TAPSE (M-mode): 2.0 cm LEFT ATRIUM             Index        RIGHT ATRIUM          Index LA diam:        3.10 cm 2.27 cm/m   RA Area:     9.70 cm LA Vol (A2C):   31.5 ml 23.02 ml/m  RA Volume:   18.20 ml 13.30 ml/m LA Vol (A4C):   54.5 ml 39.83 ml/m LA Biplane Vol: 42.1 ml 30.77 ml/m  AORTIC VALVE AV Vmax:      407.00 cm/s AV Vmean:     280.500 cm/s AV VTI:       0.804 m AV Peak Grad: 66.3 mmHg AV Mean Grad: 36.5 mmHg AI PHT:       214 msec  AORTA Ao Root diam: 2.90 cm Ao Asc diam:  3.30 cm MITRAL VALVE                TRICUSPID VALVE MV Area (PHT): 3.48 cm     TR Peak grad:   36.2 mmHg MV Peak grad:  18.3 mmHg    TR Vmax:        301.00 cm/s MV Mean grad:  6.0 mmHg MV Vmax:       2.14 m/s     SHUNTS MV Vmean:      112.0 cm/s   Systemic Diam: 1.80 cm MV Decel Time: 218 msec MR Peak grad: 137.8 mmHg MR Mean grad: 91.0 mmHg MR Vmax:      587.00 cm/s MR Vmean:     450.0 cm/s MV E velocity: 130.00 cm/s MV A velocity: 101.00 cm/s MV E/A ratio:  1.29 Nona Dell MD Electronically signed by Nona Dell MD Signature Date/Time: 09/28/2023/5:22:31 PM    Final    DG HIP UNILAT WITH  PELVIS 2-3 VIEWS RIGHT  Result Date: 09/28/2023 CLINICAL DATA:  [id number] Groin pain [id number] EXAM: DG HIP (WITH OR WITHOUT PELVIS) 2-3V RIGHT COMPARISON:  Pelvic XR, 09/01/2023 and 11/20/2021. CT hip 08/30/2023. FINDINGS: Postoperative change of LEFT hip arthroplasty, in anatomic alignment. No periprosthetic lucency or fracture. Mild, relative degenerative change otherwise normal appearance of the RIGHT hip without fracture or dislocation. Vascular calcifications. IMPRESSION: 1. No acute displaced fracture, dislocation or significant degenerative change within the RIGHT hip. 2. LEFT hip arthroplasty in anatomic alignment. No periprosthetic lucency or fracture. Electronically Signed   By: Roanna Banning M.D.   On: 09/28/2023 12:26   US THORACENTESIS ASP PLEURAL SPACE W/IMG GUIDE  Result Date: 09/28/2023 INDICATION: Patient with history of COPD, CHF, right lung cancer s/p radiation admitted with acute CHF exacerbation and found to have new left pleural effusion. Request for diagnostic and therapeutic left thoracentesis. EXAM: ULTRASOUND GUIDED LEFT THORACENTESIS MEDICATIONS: 6 mL 1% lidocaine COMPLICATIONS: None immediate. PROCEDURE: An ultrasound guided thoracentesis was thoroughly discussed with the patient and questions answered. The  benefits, risks, alternatives and complications were also discussed. The patient understands and wishes to proceed with the procedure. Written consent was obtained. Ultrasound was performed to localize and mark an adequate pocket of fluid in the left chest. The area was then prepped and draped in the normal sterile fashion. 1% Lidocaine was used for local anesthesia. Under ultrasound guidance a 6 Fr Safe-T-Centesis catheter was introduced. Thoracentesis was performed. The catheter was removed and a dressing applied. FINDINGS: A total of approximately 850 mL of clear yellow fluid was removed. Samples were sent to the laboratory as requested by the clinical team. IMPRESSION: Successful  ultrasound guided diagnostic and therapeutic LEFT thoracentesis yielding 850 mL of pleural fluid. Performed by Lynnette Caffey, PA-C Electronically Signed   By: Roanna Banning M.D.   On: 09/28/2023 12:20   DG Chest 1 View  Result Date: 09/28/2023 CLINICAL DATA:  [id number] Status post thoracentesis [id number] EXAM: PORTABLE CHEST 1 VIEW COMPARISON:  Chest XR, 09/27/2023 and 08/30/2023. IR thoracentesis, earlier same day. CT chest, 06/27/2023. FINDINGS: The cardiac apex is obscured. Aortic arch atherosclerosis. Emphysematous lung change, best appreciated at the apices. Mildly improved aeration of the LEFT chest post thoracentesis without pneumothorax. Small volume residual LEFT pleural effusion with blunting of the diaphragm, associated retrocardiac and basilar opacity is unchanged. Similar degree of aeration of the RIGHT lung with nodular opacity and surgical clip similar degree of RIGHT basilar pleural thickening. No acute osseous abnormality. IMPRESSION: 1. No pneumothorax or significant residual pleural effusion post LEFT thoracentesis. 2. Similar pulmonary findings with emphysematous lung change, RIGHT mid lung nodular opacity and LEFT basilar consolidation. Electronically Signed   By: Roanna Banning M.D.   On: 09/28/2023 11:20   DG Chest Portable 1 View  Result Date: 09/27/2023 CLINICAL DATA:  Shortness of breath EXAM: PORTABLE CHEST 1 VIEW COMPARISON:  08/30/2023, chest CT 06/27/2023 FINDINGS: Bandlike density in the right mid to lower lung with clips, corresponding to known lung nodule and possible post radiation changes. Small right-sided pleural effusion. Moderate left effusion is new compared to prior. Emphysema. Airspace disease at left base. Vascular congestion and possible mild interstitial edema superimposed on underlying emphysema. No pneumothorax. IMPRESSION: 1. Moderate left and small right pleural effusions, effusion on the left is new. Airspace disease at left base may be due to atelectasis or  pneumonia. 2. Vascular congestion and possible mild interstitial edema superimposed on underlying emphysema. 3. Bandlike density in the right mid to lower lung with clips, corresponding to known lung nodule and possible post radiation changes. Electronically Signed   By: Jasmine Pang M.D.   On: 09/27/2023 15:32   DG HIP UNILAT WITH PELVIS 2-3 VIEWS LEFT  Result Date: 09/16/2023 XR of the Left hip and AP pelvis demonstrates a pressfit hip hemiarthroplasty in good position.  The hip remains reduced.  There is no evidence of implant subsidence.  No acute fractures are noted.  AP view of the right hip is without acute injury.  No dislocation.  Impression: Left hip hemiarthroplasty in stable position, without evidence of migration or subsidence compared to prior XR     DG Pelvis 1-2 Views  Result Date: 09/01/2023 CLINICAL DATA:  Bilateral hip pain.  Surgery on the left yesterday. EXAM: PELVIS - 1-2 VIEW COMPARISON:  08/31/2023 FINDINGS: Postoperative changes with a left hip hemiarthroplasty using non cemented component. Component appears well seated. No evidence of dislocation or fracture. Skin clips and soft tissue gas are consistent with recent surgery. Mild degenerative changes in the right hip.  Moderate degenerative changes in the lower lumbar spine. Pelvis appears intact. SI joints and symphysis pubis are not displaced. Vascular calcifications. IMPRESSION: Left hip hemiarthroplasty without significant change since prior study. No acute bony abnormalities. Electronically Signed   By: Burman Nieves M.D.   On: 09/01/2023 19:24    Today   Subjective    Kathleen Pope today has no new complaints -No fever  Or chills   No Nausea, Vomiting or Diarrhea No chest pains, -Dyspnea at rest no significant dyspnea on exertion, O2 sats 95 to 97% on room air with activity  Patient has been seen and examined prior to discharge   Objective   Blood pressure 105/61, pulse 80, temperature 98.1 F (36.7 C),  temperature source Oral, resp. rate 18, height 5\' 1"  (1.549 m), weight 42.4 kg, SpO2 90%.   Intake/Output Summary (Last 24 hours) at 09/30/2023 1413 Last data filed at 09/30/2023 1251 Gross per 24 hour  Intake 720 ml  Output 400 ml  Net 320 ml   Exam Gen:- Awake Alert, no acute distress , no conversational dyspnea HEENT:- [place].AT, No sclera icterus Neck-Supple Neck,No JVD,.  Lungs-much improved air movement on the left after thoracentesis , no wheezing  CV- S1, S2 normal, regular Abd-  +ve B.Sounds, Abd Soft, No tenderness, ileostomy with output Extremity/Skin:- No  edema,   good pulses Psych-affect is appropriate, oriented x3 Neuro-no new focal deficits, no tremors    Data Review   CBC w Diff:  Lab Results  Component Value Date   WBC 7.6 09/29/2023   HGB 10.0 (L) 09/29/2023   HCT 30.0 (L) 09/29/2023   PLT 449 (H) 09/29/2023   LYMPHOPCT 10 09/27/2023   MONOPCT 9 09/27/2023   EOSPCT 2 09/27/2023   BASOPCT 0 09/27/2023   CMP:  Lab Results  Component Value Date   NA 131 (L) 09/30/2023   K 3.8 09/30/2023   CL 94 (L) 09/30/2023   CO2 27 09/30/2023   BUN 19 09/30/2023   CREATININE 0.72 09/30/2023   PROT 7.2 09/27/2023   ALBUMIN 3.5 09/27/2023   BILITOT 0.7 09/27/2023   ALKPHOS 180 (H) 09/27/2023   AST 27 09/27/2023   ALT 14 09/27/2023   Total Discharge time is about 33 minutes  Shon Hale M.D on 09/30/2023 at 2:13 PM  Go to [email or web address] -  for contact info  [facility] - Office  [phone]

## 2023-09-30 NOTE — Plan of Care (Signed)

## 2023-09-30 NOTE — TOC Transition Note (Addendum)
Transition of Care [facility]) - CM/SW Discharge Note   Patient Details  Name: Kathleen Pope MRN: [id number] Date of Birth: 10-Dec-1932  Transition of Care [facility]) CM/SW Contact:  Leitha Bleak, RN Phone Number: 09/30/2023, 1:36 PM   Clinical Narrative:    Patient discharging home, was able to wean off oxygen. She lives alone, [place] provides transportation. She is active with [facility] for PT/AIDE.  MD aware to place resumption orders. Morrie Sheldon update with discharge, and we discussed adding an RN for CHF education. She approved, RN added. Information added to AVS for patient to review.     Final next level of care: Home w Home Health Services Barriers to Discharge: Barriers Resolved   Patient Goals and CMS Choice CMS Medicare.gov Compare Post Acute Care list provided to:: Patient Choice offered to / list presented to : Patient  Discharge Placement    Patient and family notified of of transfer: 09/30/23  Discharge Plan and Services Additional resources added to the After Visit Summary for       Social Determinants of Health (SDOH) Interventions SDOH Screenings   Food Insecurity: No Food Insecurity (08/30/2023)  Housing: Low Risk  (08/30/2023)  Transportation Needs: No Transportation Needs (08/30/2023)  Utilities: Not At Risk (08/30/2023)  Depression (PHQ2-9): Low Risk  (01/03/2023)  Tobacco Use: High Risk (09/28/2023)     Readmission Risk Interventions    09/30/2023    1:36 PM 07/19/2023    3:46 PM 01/25/2021   12:06 PM  Readmission Risk Prevention Plan  Post Dischage Appt  Complete   Medication Screening  Complete   Transportation Screening Complete Complete Complete  PCP or Specialist Appt within 3-5 Days Not Complete    HRI or Home Care Consult Complete  Complete  Social Work Consult for Recovery Care Planning/Counseling Complete  Complete  Palliative Care Screening Not Applicable  Not Applicable  Medication Review Oceanographer) Complete  Complete

## 2023-09-30 NOTE — Progress Notes (Signed)
Patient vitas have been stable this shift and oxygen saturation has been 95% on 1 liters of oxygen.  Patient has continued to have a dry cough and has had c/o of insomnia but medicated per MD order. No other complaints this shift.  Weight this am is 45.2 kg

## 2023-09-30 NOTE — Progress Notes (Signed)
   09/30/23 0800  ReDS Vest / Clip  Station Marker A  Ruler Value 28  ReDS Value Range < 36  ReDS Actual Value 22  Anatomical Comments sitting

## 2023-09-30 NOTE — Discharge Instructions (Signed)
1)Follow up with cardiologist Dr. Wyline Mood or Randall An in 1 to 2 weeks for recheck and reevaluation  2)Follow up with oncologist Dr. Ellin Saba as previously scheduled for right lung cancer 3)Follow-up with your orthopedic surgeon Dr. Dallas Schimke if hip pain persist 4) repeat CBC and BMP blood test within a week advised 5) abstinence from tobacco advised--okay to use nicotine patch to help you quit smoking 6)Very low-salt diet advised--less than 2 g of sodium per day advised 7)Weigh yourself daily, call if you gain more than 3 pounds in 1 day or more than 5 pounds in 1 week as your diuretic medications may need to be adjusted

## 2023-09-30 NOTE — Care Management Important Message (Signed)
Important Message  Patient Details  Name: Kathleen Pope MRN: [id number] Date of Birth: 10-25-1933   Important Message Given:  Yes - Medicare IM     Corey Harold 09/30/2023, 12:03 PM

## 2023-10-04 DIAGNOSIS — I10 Essential (primary) hypertension: Secondary | ICD-10-CM | POA: Diagnosis not present

## 2023-10-04 DIAGNOSIS — Z7982 Long term (current) use of aspirin: Secondary | ICD-10-CM | POA: Diagnosis not present

## 2023-10-04 DIAGNOSIS — Z433 Encounter for attention to colostomy: Secondary | ICD-10-CM | POA: Diagnosis not present

## 2023-10-04 DIAGNOSIS — F17210 Nicotine dependence, cigarettes, uncomplicated: Secondary | ICD-10-CM | POA: Diagnosis not present

## 2023-10-04 DIAGNOSIS — Z96642 Presence of left artificial hip joint: Secondary | ICD-10-CM | POA: Diagnosis not present

## 2023-10-04 DIAGNOSIS — Z79899 Other long term (current) drug therapy: Secondary | ICD-10-CM | POA: Diagnosis not present

## 2023-10-04 DIAGNOSIS — S7292XD Unspecified fracture of left femur, subsequent encounter for closed fracture with routine healing: Secondary | ICD-10-CM | POA: Diagnosis not present

## 2023-10-04 DIAGNOSIS — Z9181 History of falling: Secondary | ICD-10-CM | POA: Diagnosis not present

## 2023-10-04 DIAGNOSIS — Z85118 Personal history of other malignant neoplasm of bronchus and lung: Secondary | ICD-10-CM | POA: Diagnosis not present

## 2023-10-04 DIAGNOSIS — D649 Anemia, unspecified: Secondary | ICD-10-CM | POA: Diagnosis not present

## 2023-10-04 DIAGNOSIS — J449 Chronic obstructive pulmonary disease, unspecified: Secondary | ICD-10-CM | POA: Diagnosis not present

## 2023-10-10 DIAGNOSIS — J449 Chronic obstructive pulmonary disease, unspecified: Secondary | ICD-10-CM | POA: Diagnosis not present

## 2023-10-10 DIAGNOSIS — Z85038 Personal history of other malignant neoplasm of large intestine: Secondary | ICD-10-CM | POA: Diagnosis not present

## 2023-10-10 DIAGNOSIS — E782 Mixed hyperlipidemia: Secondary | ICD-10-CM | POA: Diagnosis not present

## 2023-10-10 DIAGNOSIS — Z9181 History of falling: Secondary | ICD-10-CM | POA: Diagnosis not present

## 2023-10-10 DIAGNOSIS — Z7982 Long term (current) use of aspirin: Secondary | ICD-10-CM | POA: Diagnosis not present

## 2023-10-10 DIAGNOSIS — D509 Iron deficiency anemia, unspecified: Secondary | ICD-10-CM | POA: Diagnosis not present

## 2023-10-10 DIAGNOSIS — S7292XD Unspecified fracture of left femur, subsequent encounter for closed fracture with routine healing: Secondary | ICD-10-CM | POA: Diagnosis not present

## 2023-10-10 DIAGNOSIS — G8929 Other chronic pain: Secondary | ICD-10-CM | POA: Diagnosis not present

## 2023-10-10 DIAGNOSIS — Z66 Do not resuscitate: Secondary | ICD-10-CM | POA: Diagnosis not present

## 2023-10-10 DIAGNOSIS — Z7951 Long term (current) use of inhaled steroids: Secondary | ICD-10-CM | POA: Diagnosis not present

## 2023-10-10 DIAGNOSIS — E876 Hypokalemia: Secondary | ICD-10-CM | POA: Diagnosis not present

## 2023-10-10 DIAGNOSIS — C3491 Malignant neoplasm of unspecified part of right bronchus or lung: Secondary | ICD-10-CM | POA: Diagnosis not present

## 2023-10-10 DIAGNOSIS — Z96642 Presence of left artificial hip joint: Secondary | ICD-10-CM | POA: Diagnosis not present

## 2023-10-10 DIAGNOSIS — E43 Unspecified severe protein-calorie malnutrition: Secondary | ICD-10-CM | POA: Diagnosis not present

## 2023-10-10 DIAGNOSIS — I110 Hypertensive heart disease with heart failure: Secondary | ICD-10-CM | POA: Diagnosis not present

## 2023-10-10 DIAGNOSIS — Z604 Social exclusion and rejection: Secondary | ICD-10-CM | POA: Diagnosis not present

## 2023-10-10 DIAGNOSIS — E871 Hypo-osmolality and hyponatremia: Secondary | ICD-10-CM | POA: Diagnosis not present

## 2023-10-10 DIAGNOSIS — Z933 Colostomy status: Secondary | ICD-10-CM | POA: Diagnosis not present

## 2023-10-10 DIAGNOSIS — J9601 Acute respiratory failure with hypoxia: Secondary | ICD-10-CM | POA: Diagnosis not present

## 2023-10-10 DIAGNOSIS — F17210 Nicotine dependence, cigarettes, uncomplicated: Secondary | ICD-10-CM | POA: Diagnosis not present

## 2023-10-10 DIAGNOSIS — D630 Anemia in neoplastic disease: Secondary | ICD-10-CM | POA: Diagnosis not present

## 2023-10-10 DIAGNOSIS — I5031 Acute diastolic (congestive) heart failure: Secondary | ICD-10-CM | POA: Diagnosis not present

## 2023-10-11 DIAGNOSIS — E876 Hypokalemia: Secondary | ICD-10-CM | POA: Diagnosis not present

## 2023-10-11 DIAGNOSIS — J9601 Acute respiratory failure with hypoxia: Secondary | ICD-10-CM | POA: Diagnosis not present

## 2023-10-11 DIAGNOSIS — E43 Unspecified severe protein-calorie malnutrition: Secondary | ICD-10-CM | POA: Diagnosis not present

## 2023-10-11 DIAGNOSIS — C3491 Malignant neoplasm of unspecified part of right bronchus or lung: Secondary | ICD-10-CM | POA: Diagnosis not present

## 2023-10-11 DIAGNOSIS — E782 Mixed hyperlipidemia: Secondary | ICD-10-CM | POA: Diagnosis not present

## 2023-10-11 DIAGNOSIS — I5031 Acute diastolic (congestive) heart failure: Secondary | ICD-10-CM | POA: Diagnosis not present

## 2023-10-11 DIAGNOSIS — E871 Hypo-osmolality and hyponatremia: Secondary | ICD-10-CM | POA: Diagnosis not present

## 2023-10-11 DIAGNOSIS — F17210 Nicotine dependence, cigarettes, uncomplicated: Secondary | ICD-10-CM | POA: Diagnosis not present

## 2023-10-11 DIAGNOSIS — G8929 Other chronic pain: Secondary | ICD-10-CM | POA: Diagnosis not present

## 2023-10-11 DIAGNOSIS — I110 Hypertensive heart disease with heart failure: Secondary | ICD-10-CM | POA: Diagnosis not present

## 2023-10-11 DIAGNOSIS — D509 Iron deficiency anemia, unspecified: Secondary | ICD-10-CM | POA: Diagnosis not present

## 2023-10-11 DIAGNOSIS — S7292XD Unspecified fracture of left femur, subsequent encounter for closed fracture with routine healing: Secondary | ICD-10-CM | POA: Diagnosis not present

## 2023-10-11 DIAGNOSIS — Z933 Colostomy status: Secondary | ICD-10-CM | POA: Diagnosis not present

## 2023-10-11 DIAGNOSIS — D630 Anemia in neoplastic disease: Secondary | ICD-10-CM | POA: Diagnosis not present

## 2023-10-11 DIAGNOSIS — J449 Chronic obstructive pulmonary disease, unspecified: Secondary | ICD-10-CM | POA: Diagnosis not present

## 2023-10-11 DIAGNOSIS — Z66 Do not resuscitate: Secondary | ICD-10-CM | POA: Diagnosis not present

## 2023-10-12 ENCOUNTER — Encounter: Payer: Self-pay | Admitting: Orthopedic Surgery

## 2023-10-12 ENCOUNTER — Ambulatory Visit: Payer: Medicare Other | Admitting: Orthopedic Surgery

## 2023-10-12 ENCOUNTER — Other Ambulatory Visit (INDEPENDENT_AMBULATORY_CARE_PROVIDER_SITE_OTHER): Payer: Medicare Other

## 2023-10-12 DIAGNOSIS — Z96642 Presence of left artificial hip joint: Secondary | ICD-10-CM

## 2023-10-12 DIAGNOSIS — Z96649 Presence of unspecified artificial hip joint: Secondary | ICD-10-CM

## 2023-10-12 MED ORDER — OXYCODONE HCL 5 MG PO TABS: 2.5000 mg | ORAL_TABLET | Freq: Four times a day (QID) | ORAL | 0 refills | Status: DC | PRN

## 2023-10-13 NOTE — Progress Notes (Signed)
Orthopaedic Postop Note  Assessment: Kathleen Pope is a 89 y.o. female s/p Left hip hemiarthroplasty  DOS: 08/31/2023  Plan: Kathleen Pope continues to improve.  She has done well following her recent hospitalization for medical treatment.  She feels as though the left leg is getting stronger.  She continues to use a walker to assist with ambulation.  Right groin pain has remained stable.  It has not gotten worse.  We did briefly discuss obtaining an MRI.  She is comfortable proceeding with an MRI at this point.  I think this is reasonable.  She will continue posterior precautions.  Walking is excellent therapy.  She will return to clinic in approximately 6 weeks.   Follow-up: Return in about 6 weeks (around 11/23/2023). XR at next visit: AP pelvis and Left hip  Subjective:  Chief Complaint  Patient presents with   Post-op Follow-up    Hemiarthroplasty 08/31/23 improving/ walking with walker states feeling a bit stronger     History of Present Illness: Kathleen Pope is a 89 y.o. female who presents following the above stated procedure.  Surgery was 6 weeks ago.  She continues to get better.  She was recently hospitalized, spending almost a full week in the hospital.  She is doing better.  She continues to have some pain in the groin.  It is not getting worse.  She has been taking very small doses of her oxycodone, cutting the pills in half.  She is taking this 1-2 times per day.  Review of Systems: No fevers or chills No numbness or tingling No Chest Pain No shortness of breath   Objective: There were no vitals taken for this visit.  Physical Exam:  Elderly female.  No acute distress.  Stable gait with walker.  Surgical incision is healing well.  No surrounding erythema or drainage.  There is some surrounding skin irritation.  No tenderness to palpation.  She tolerates gentle range of motion of the left hip.  She is able to maintain straight leg raise.  Active motion intact in the  EHL/TA.  The mild discomfort in the right groin with axial loading.  She tolerates gentle range of motion of the right hip.  Toes are warm and well-perfused.  IMAGING: I personally ordered and reviewed the following images:  AP pelvis and left hip x-rays were obtained in clinic today.  These are compared to prior x-rays.  Well-positioned left hip hemiarthroplasty.  No acute fractures.  No subsidence.  No periprosthetic lucency.  Hip is reduced.  Single view of the right hip demonstrates no obvious injury.    Impression: Stable left hip hemiarthroplasty without subsidence  Oliver Barre, MD 10/13/2023 1:31 PM

## 2023-10-17 DIAGNOSIS — E43 Unspecified severe protein-calorie malnutrition: Secondary | ICD-10-CM | POA: Diagnosis not present

## 2023-10-17 DIAGNOSIS — D630 Anemia in neoplastic disease: Secondary | ICD-10-CM | POA: Diagnosis not present

## 2023-10-17 DIAGNOSIS — F17210 Nicotine dependence, cigarettes, uncomplicated: Secondary | ICD-10-CM | POA: Diagnosis not present

## 2023-10-17 DIAGNOSIS — D509 Iron deficiency anemia, unspecified: Secondary | ICD-10-CM | POA: Diagnosis not present

## 2023-10-17 DIAGNOSIS — Z66 Do not resuscitate: Secondary | ICD-10-CM | POA: Diagnosis not present

## 2023-10-17 DIAGNOSIS — E782 Mixed hyperlipidemia: Secondary | ICD-10-CM | POA: Diagnosis not present

## 2023-10-17 DIAGNOSIS — I110 Hypertensive heart disease with heart failure: Secondary | ICD-10-CM | POA: Diagnosis not present

## 2023-10-17 DIAGNOSIS — E871 Hypo-osmolality and hyponatremia: Secondary | ICD-10-CM | POA: Diagnosis not present

## 2023-10-17 DIAGNOSIS — I5031 Acute diastolic (congestive) heart failure: Secondary | ICD-10-CM | POA: Diagnosis not present

## 2023-10-17 DIAGNOSIS — S7292XD Unspecified fracture of left femur, subsequent encounter for closed fracture with routine healing: Secondary | ICD-10-CM | POA: Diagnosis not present

## 2023-10-17 DIAGNOSIS — Z933 Colostomy status: Secondary | ICD-10-CM | POA: Diagnosis not present

## 2023-10-17 DIAGNOSIS — J449 Chronic obstructive pulmonary disease, unspecified: Secondary | ICD-10-CM | POA: Diagnosis not present

## 2023-10-17 DIAGNOSIS — J9601 Acute respiratory failure with hypoxia: Secondary | ICD-10-CM | POA: Diagnosis not present

## 2023-10-17 DIAGNOSIS — G8929 Other chronic pain: Secondary | ICD-10-CM | POA: Diagnosis not present

## 2023-10-17 DIAGNOSIS — C3491 Malignant neoplasm of unspecified part of right bronchus or lung: Secondary | ICD-10-CM | POA: Diagnosis not present

## 2023-10-17 DIAGNOSIS — E876 Hypokalemia: Secondary | ICD-10-CM | POA: Diagnosis not present

## 2023-10-19 DIAGNOSIS — D509 Iron deficiency anemia, unspecified: Secondary | ICD-10-CM | POA: Diagnosis not present

## 2023-10-19 DIAGNOSIS — Z66 Do not resuscitate: Secondary | ICD-10-CM | POA: Diagnosis not present

## 2023-10-19 DIAGNOSIS — D630 Anemia in neoplastic disease: Secondary | ICD-10-CM | POA: Diagnosis not present

## 2023-10-19 DIAGNOSIS — J9601 Acute respiratory failure with hypoxia: Secondary | ICD-10-CM | POA: Diagnosis not present

## 2023-10-19 DIAGNOSIS — C3491 Malignant neoplasm of unspecified part of right bronchus or lung: Secondary | ICD-10-CM | POA: Diagnosis not present

## 2023-10-19 DIAGNOSIS — E782 Mixed hyperlipidemia: Secondary | ICD-10-CM | POA: Diagnosis not present

## 2023-10-19 DIAGNOSIS — Z933 Colostomy status: Secondary | ICD-10-CM | POA: Diagnosis not present

## 2023-10-19 DIAGNOSIS — I110 Hypertensive heart disease with heart failure: Secondary | ICD-10-CM | POA: Diagnosis not present

## 2023-10-19 DIAGNOSIS — S7292XD Unspecified fracture of left femur, subsequent encounter for closed fracture with routine healing: Secondary | ICD-10-CM | POA: Diagnosis not present

## 2023-10-19 DIAGNOSIS — I5031 Acute diastolic (congestive) heart failure: Secondary | ICD-10-CM | POA: Diagnosis not present

## 2023-10-19 DIAGNOSIS — F17210 Nicotine dependence, cigarettes, uncomplicated: Secondary | ICD-10-CM | POA: Diagnosis not present

## 2023-10-19 DIAGNOSIS — J449 Chronic obstructive pulmonary disease, unspecified: Secondary | ICD-10-CM | POA: Diagnosis not present

## 2023-10-19 DIAGNOSIS — G8929 Other chronic pain: Secondary | ICD-10-CM | POA: Diagnosis not present

## 2023-10-19 DIAGNOSIS — E876 Hypokalemia: Secondary | ICD-10-CM | POA: Diagnosis not present

## 2023-10-19 DIAGNOSIS — E871 Hypo-osmolality and hyponatremia: Secondary | ICD-10-CM | POA: Diagnosis not present

## 2023-10-19 DIAGNOSIS — E43 Unspecified severe protein-calorie malnutrition: Secondary | ICD-10-CM | POA: Diagnosis not present

## 2023-10-20 ENCOUNTER — Encounter (HOSPITAL_COMMUNITY): Payer: Self-pay | Admitting: Emergency Medicine

## 2023-10-20 ENCOUNTER — Emergency Department (HOSPITAL_COMMUNITY): Payer: Medicare Other

## 2023-10-20 ENCOUNTER — Other Ambulatory Visit: Payer: Self-pay

## 2023-10-20 ENCOUNTER — Observation Stay (HOSPITAL_COMMUNITY)
Admission: EM | Admit: 2023-10-20 | Discharge: 2023-10-21 | Disposition: A | Discharge status: Home / Self Care | Payer: Medicare Other | Attending: Family Medicine | Admitting: Family Medicine

## 2023-10-20 DIAGNOSIS — E876 Hypokalemia: Secondary | ICD-10-CM | POA: Diagnosis not present

## 2023-10-20 DIAGNOSIS — D509 Iron deficiency anemia, unspecified: Secondary | ICD-10-CM | POA: Diagnosis present

## 2023-10-20 DIAGNOSIS — J90 Pleural effusion, not elsewhere classified: Secondary | ICD-10-CM | POA: Diagnosis not present

## 2023-10-20 DIAGNOSIS — Z515 Encounter for palliative care: Secondary | ICD-10-CM | POA: Diagnosis not present

## 2023-10-20 DIAGNOSIS — Z932 Ileostomy status: Secondary | ICD-10-CM | POA: Insufficient documentation

## 2023-10-20 DIAGNOSIS — W19XXXA Unspecified fall, initial encounter: Secondary | ICD-10-CM | POA: Diagnosis not present

## 2023-10-20 DIAGNOSIS — R0602 Shortness of breath: Secondary | ICD-10-CM | POA: Diagnosis present

## 2023-10-20 DIAGNOSIS — J9601 Acute respiratory failure with hypoxia: Principal | ICD-10-CM | POA: Insufficient documentation

## 2023-10-20 DIAGNOSIS — M6281 Muscle weakness (generalized): Secondary | ICD-10-CM | POA: Insufficient documentation

## 2023-10-20 DIAGNOSIS — Z1152 Encounter for screening for COVID-19: Secondary | ICD-10-CM | POA: Diagnosis not present

## 2023-10-20 DIAGNOSIS — I5031 Acute diastolic (congestive) heart failure: Secondary | ICD-10-CM | POA: Diagnosis present

## 2023-10-20 DIAGNOSIS — Z85828 Personal history of other malignant neoplasm of skin: Secondary | ICD-10-CM | POA: Diagnosis not present

## 2023-10-20 DIAGNOSIS — I110 Hypertensive heart disease with heart failure: Secondary | ICD-10-CM | POA: Insufficient documentation

## 2023-10-20 DIAGNOSIS — K922 Gastrointestinal hemorrhage, unspecified: Secondary | ICD-10-CM | POA: Diagnosis not present

## 2023-10-20 DIAGNOSIS — J449 Chronic obstructive pulmonary disease, unspecified: Secondary | ICD-10-CM | POA: Diagnosis not present

## 2023-10-20 DIAGNOSIS — R0989 Other specified symptoms and signs involving the circulatory and respiratory systems: Secondary | ICD-10-CM | POA: Diagnosis not present

## 2023-10-20 DIAGNOSIS — Z79899 Other long term (current) drug therapy: Secondary | ICD-10-CM | POA: Insufficient documentation

## 2023-10-20 DIAGNOSIS — S72002A Fracture of unspecified part of neck of left femur, initial encounter for closed fracture: Secondary | ICD-10-CM | POA: Diagnosis present

## 2023-10-20 DIAGNOSIS — Z7189 Other specified counseling: Secondary | ICD-10-CM

## 2023-10-20 DIAGNOSIS — S7292XA Unspecified fracture of left femur, initial encounter for closed fracture: Secondary | ICD-10-CM | POA: Diagnosis not present

## 2023-10-20 DIAGNOSIS — Z9981 Dependence on supplemental oxygen: Secondary | ICD-10-CM | POA: Diagnosis not present

## 2023-10-20 DIAGNOSIS — Z923 Personal history of irradiation: Secondary | ICD-10-CM | POA: Insufficient documentation

## 2023-10-20 DIAGNOSIS — I509 Heart failure, unspecified: Secondary | ICD-10-CM

## 2023-10-20 DIAGNOSIS — R2681 Unsteadiness on feet: Secondary | ICD-10-CM | POA: Diagnosis not present

## 2023-10-20 DIAGNOSIS — Z85118 Personal history of other malignant neoplasm of bronchus and lung: Secondary | ICD-10-CM | POA: Diagnosis not present

## 2023-10-20 DIAGNOSIS — E871 Hypo-osmolality and hyponatremia: Secondary | ICD-10-CM | POA: Diagnosis not present

## 2023-10-20 DIAGNOSIS — R0902 Hypoxemia: Secondary | ICD-10-CM | POA: Diagnosis not present

## 2023-10-20 DIAGNOSIS — Z66 Do not resuscitate: Secondary | ICD-10-CM | POA: Diagnosis not present

## 2023-10-20 DIAGNOSIS — R918 Other nonspecific abnormal finding of lung field: Secondary | ICD-10-CM | POA: Diagnosis not present

## 2023-10-20 DIAGNOSIS — C3491 Malignant neoplasm of unspecified part of right bronchus or lung: Secondary | ICD-10-CM | POA: Diagnosis present

## 2023-10-20 DIAGNOSIS — F17210 Nicotine dependence, cigarettes, uncomplicated: Secondary | ICD-10-CM | POA: Diagnosis not present

## 2023-10-20 DIAGNOSIS — J9600 Acute respiratory failure, unspecified whether with hypoxia or hypercapnia: Secondary | ICD-10-CM | POA: Diagnosis present

## 2023-10-20 DIAGNOSIS — E782 Mixed hyperlipidemia: Secondary | ICD-10-CM | POA: Diagnosis present

## 2023-10-20 DIAGNOSIS — I10 Essential (primary) hypertension: Secondary | ICD-10-CM | POA: Diagnosis present

## 2023-10-20 LAB — CBC WITH DIFFERENTIAL/PLATELET
Abs Immature Granulocytes: 0.05 10*3/uL (ref 0.00–0.07)
Basophils Absolute: 0 10*3/uL (ref 0.0–0.1)
Basophils Relative: 0 %
Eosinophils Absolute: 0.1 10*3/uL (ref 0.0–0.5)
Eosinophils Relative: 1 %
HCT: 34.4 % — ABNORMAL LOW (ref 36.0–46.0)
Hemoglobin: 11 g/dL — ABNORMAL LOW (ref 12.0–15.0)
Immature Granulocytes: 1 %
Lymphocytes Relative: 7 %
Lymphs Abs: 0.7 10*3/uL (ref 0.7–4.0)
MCH: 29.9 pg (ref 26.0–34.0)
MCHC: 32 g/dL (ref 30.0–36.0)
MCV: 93.5 fL (ref 80.0–100.0)
Monocytes Absolute: 0.8 10*3/uL (ref 0.1–1.0)
Monocytes Relative: 8 %
Neutro Abs: 8.4 10*3/uL — ABNORMAL HIGH (ref 1.7–7.7)
Neutrophils Relative %: 83 %
Platelets: 504 10*3/uL — ABNORMAL HIGH (ref 150–400)
RBC: 3.68 MIL/uL — ABNORMAL LOW (ref 3.87–5.11)
RDW: 15.3 % (ref 11.5–15.5)
WBC: 10.1 10*3/uL (ref 4.0–10.5)
nRBC: 0 % (ref 0.0–0.2)

## 2023-10-20 LAB — COMPREHENSIVE METABOLIC PANEL
ALT: 12 U/L (ref 0–44)
AST: 25 U/L (ref 15–41)
Albumin: 3.4 g/dL — ABNORMAL LOW (ref 3.5–5.0)
Alkaline Phosphatase: 140 U/L — ABNORMAL HIGH (ref 38–126)
Anion gap: 12 (ref 5–15)
BUN: 16 mg/dL (ref 8–23)
CO2: 25 mmol/L (ref 22–32)
Calcium: 9.1 mg/dL (ref 8.9–10.3)
Chloride: 96 mmol/L — ABNORMAL LOW (ref 98–111)
Creatinine, Ser: 0.69 mg/dL (ref 0.44–1.00)
GFR, Estimated: >60 mL/min (ref >60)
Glucose, Bld: 119 mg/dL — ABNORMAL HIGH (ref 70–99)
Potassium: 3.4 mmol/L — ABNORMAL LOW (ref 3.5–5.1)
Sodium: 133 mmol/L — ABNORMAL LOW (ref 135–145)
Total Bilirubin: 0.6 mg/dL (ref <1.2)
Total Protein: 7 g/dL (ref 6.5–8.1)

## 2023-10-20 LAB — PROTIME-INR
INR: 1 (ref 0.8–1.2)
Prothrombin Time: 13.1 s (ref 11.4–15.2)

## 2023-10-20 LAB — RESP PANEL BY RT-PCR (RSV, FLU A&B, COVID)  RVPGX2
Influenza A by PCR: NEGATIVE
Influenza B by PCR: NEGATIVE
Resp Syncytial Virus by PCR: NEGATIVE
SARS Coronavirus 2 by RT PCR: NEGATIVE

## 2023-10-20 LAB — PHOSPHORUS: Phosphorus: 3.4 mg/dL (ref 2.5–4.6)

## 2023-10-20 LAB — BRAIN NATRIURETIC PEPTIDE: B Natriuretic Peptide: 807 pg/mL — ABNORMAL HIGH (ref 0.0–100.0)

## 2023-10-20 LAB — TROPONIN I (HIGH SENSITIVITY)
Troponin I (High Sensitivity): 14 ng/L (ref <18)
Troponin I (High Sensitivity): 12 ng/L (ref <18)

## 2023-10-20 LAB — MAGNESIUM: Magnesium: 2 mg/dL (ref 1.7–2.4)

## 2023-10-20 MED ORDER — FUROSEMIDE 10 MG/ML IJ SOLN
20.0000 mg | Freq: Once | INTRAMUSCULAR | Status: AC
  Administered 2023-10-20: 20 mg via INTRAVENOUS
  Filled 2023-10-20: qty 2

## 2023-10-20 MED ORDER — HYDRALAZINE HCL 20 MG/ML IJ SOLN: 10.0000 mg | INTRAMUSCULAR | Status: DC | PRN

## 2023-10-20 MED ORDER — ACETAMINOPHEN 650 MG RE SUPP: 650.0000 mg | Freq: Four times a day (QID) | RECTAL | Status: DC | PRN

## 2023-10-20 MED ORDER — IPRATROPIUM BROMIDE 0.02 % IN SOLN: 0.5000 mg | Freq: Four times a day (QID) | RESPIRATORY_TRACT | Status: DC | PRN

## 2023-10-20 MED ORDER — ONDANSETRON HCL 4 MG PO TABS: 4.0000 mg | ORAL_TABLET | Freq: Four times a day (QID) | ORAL | Status: DC | PRN

## 2023-10-20 MED ORDER — TRAZODONE HCL 50 MG PO TABS
25.0000 mg | ORAL_TABLET | Freq: Every evening | ORAL | Status: DC | PRN
  Administered 2023-10-20: 25 mg via ORAL
  Filled 2023-10-20: qty 1

## 2023-10-20 MED ORDER — HEPARIN SODIUM (PORCINE) 5000 UNIT/ML IJ SOLN
5000.0000 [IU] | Freq: Three times a day (TID) | INTRAMUSCULAR | Status: DC
  Administered 2023-10-20 – 2023-10-21 (×3): 5000 [IU] via SUBCUTANEOUS
  Filled 2023-10-20 (×3): qty 1

## 2023-10-20 MED ORDER — ONDANSETRON HCL 4 MG/2ML IJ SOLN: 4.0000 mg | Freq: Four times a day (QID) | INTRAMUSCULAR | Status: DC | PRN

## 2023-10-20 MED ORDER — SODIUM CHLORIDE 0.9% FLUSH
3.0000 mL | Freq: Two times a day (BID) | INTRAVENOUS | Status: DC
  Administered 2023-10-20 – 2023-10-21 (×3): 3 mL via INTRAVENOUS

## 2023-10-20 MED ORDER — OXYCODONE HCL 5 MG PO TABS
5.0000 mg | ORAL_TABLET | ORAL | Status: DC | PRN
  Administered 2023-10-20: 5 mg via ORAL
  Filled 2023-10-20: qty 1

## 2023-10-20 MED ORDER — ACETAMINOPHEN 325 MG PO TABS: 650.0000 mg | ORAL_TABLET | Freq: Four times a day (QID) | ORAL | Status: DC | PRN

## 2023-10-20 MED ORDER — FUROSEMIDE 10 MG/ML IJ SOLN
40.0000 mg | Freq: Two times a day (BID) | INTRAMUSCULAR | Status: DC
  Administered 2023-10-20 – 2023-10-21 (×3): 40 mg via INTRAVENOUS
  Filled 2023-10-20 (×3): qty 4

## 2023-10-20 MED ORDER — POTASSIUM CHLORIDE CRYS ER 20 MEQ PO TBCR
40.0000 meq | EXTENDED_RELEASE_TABLET | Freq: Once | ORAL | Status: AC
  Administered 2023-10-20: 40 meq via ORAL
  Filled 2023-10-20: qty 2

## 2023-10-20 MED ORDER — LEVALBUTEROL HCL 0.63 MG/3ML IN NEBU: 0.6300 mg | INHALATION_SOLUTION | Freq: Four times a day (QID) | RESPIRATORY_TRACT | Status: DC | PRN

## 2023-10-20 MED ORDER — HYDROMORPHONE HCL 1 MG/ML IJ SOLN: 0.5000 mg | INTRAMUSCULAR | Status: DC | PRN

## 2023-10-20 MED ORDER — FLEET ENEMA RE ENEM: 1.0000 | ENEMA | Freq: Once | RECTAL | Status: DC | PRN

## 2023-10-20 MED ORDER — BISACODYL 5 MG PO TBEC: 5.0000 mg | DELAYED_RELEASE_TABLET | Freq: Every day | ORAL | Status: DC | PRN

## 2023-10-20 MED ORDER — SENNOSIDES-DOCUSATE SODIUM 8.6-50 MG PO TABS: 1.0000 | ORAL_TABLET | Freq: Every evening | ORAL | Status: DC | PRN

## 2023-10-20 MED ORDER — METHYLPREDNISOLONE SODIUM SUCC 40 MG IJ SOLR
40.0000 mg | Freq: Two times a day (BID) | INTRAMUSCULAR | Status: DC
  Administered 2023-10-20 – 2023-10-21 (×3): 40 mg via INTRAVENOUS
  Filled 2023-10-20 (×3): qty 1

## 2023-10-20 NOTE — Assessment & Plan Note (Addendum)
-   Daily weight, I's and O's, BNP and Reds Clip  -Continue IV Lasix 40 mg twice a day -Last echo reviewed 09/28/2023- EJF: 65-70% mild LVH -Continue home medications -Elevated BNP -Low-sodium diet

## 2023-10-20 NOTE — Assessment & Plan Note (Addendum)
-   Monitoring closely, DuoNeb bronchodilator,  -IV steroid with quick taper

## 2023-10-20 NOTE — ED Notes (Signed)
Pt given new purewick, peri care, and full linen change

## 2023-10-20 NOTE — Assessment & Plan Note (Signed)
-  Continue statins ?

## 2023-10-20 NOTE — Assessment & Plan Note (Signed)
-   Monitoring likely secondary to pleural effusion, SIADH, lung malignancy, -Close to baseline, following BMP

## 2023-10-20 NOTE — Consult Note (Signed)
Consultation Note Date: 10/20/2023   Patient Name: Kathleen Pope  DOB: 1933-10-28  MRN: [id number]  Age / Sex: 89 y.o., female  PCP: Elfredia Nevins, MD Referring Physician: Kendell Bane, MD  Reason for Consultation: Establishing goals of care  HPI/Patient Profile: 89 y.o. female  with past medical history of R lung CA under treatment s/p radiation tx, COPD, not O2 dependent, longtime current smoker, HFp EF, HTN/HLD, ileostomy status post GI hemorrhage at cecal ulcer 2022, s/p left hip arthroplasty 10/16 admitted on 10/20/2023 with acute aspiratory failure that is multifactorial including heart failure with preserved EF and increased left pleural effusion.      Clinical Assessment and Goals of Care: I have reviewed medical records including EPIC notes, labs and imaging, received report from RN, assessed the patient.  [name] is lying quietly on the stretcher in the ED.  She appears chronically ill and somewhat frail, thin.  She greets me, making and keeping eye contact.  She is alert and oriented x 3, able to make her needs known.  Her niece, [name], is present at bedside.  We meet at the bedside to discuss diagnosis prognosis, GOC, EOL wishes, disposition and options.  I introduced Palliative Medicine as specialized medical care for people living with serious illness. It focuses on providing relief from the symptoms and stress of a serious illness. The goal is to improve quality of life for both the patient and the family.  We discussed a brief life review of the patient.  [name] is a widow.  Her only child, daughter, died approximately 3 years ago.  She has been living independently, but with help with ADLs from a home health aide.  Her nephew [name] and his wife [name] help with her needs.  She states that she has a church family who also helps by bringing meals.  We then focused on their current illness.   We talk about her fluid overload, heart failure.  We talk in detail about how the heart works.  [name] states that she had seen some swelling in her ankle.  She shared that she had been taking Lasix.  We also talk about pleural effusion and the plan for thoracentesis.  She shares she had a thoracentesis about a month ago, approximately 0.75 L removed.  We talked about possible causes for pleural effusion.  We talk about her lung cancer and the treatment plan.  She tells me that she has follow-up with trusted oncologist, Dr. Ellin Saba, in February.  The natural disease trajectory and expectations at EOL were discussed.  Advanced directives, concepts specific to code status, artifical feeding and hydration, and rehospitalization were considered and discussed.  [name] readily and easily states that she would not want attempted resuscitation.  She speaks of her faith.  Palliative Care services outpatient were explained and offered.  We talked about the benefits of outpatient palliative services for further goals of care discussions and support.  Patient and niece quickly and readily endorsed desire for outpatient palliative  services.  We talk about provider choice.  I encouraged them to work with transition of care team when they choose a provider.  Discussed the importance of continued conversation with family and the medical providers regarding overall plan of care and treatment options, ensuring decisions are within the context of the patient's values and GOCs.  Questions and concerns were addressed.  The family was encouraged to call with questions or concerns.  PMT will continue to support holistically.  Conference with attending, bedside nursing staff, transition of care team related to patient condition, needs, goals of care, disposition.    HCPOA HCPOA -nephew, [name] along with his wife [name].  Mrs. Benston is a widow her daughter died 2 to 3 years ago.    SUMMARY OF RECOMMENDATIONS    At this point continue to treat the treatable but no CPR or intubation.   Time for outcomes. Would prefer to return home with home health services already in place. Agreeable to outpatient palliative services, considering provider choice.   Code Status/Advance Care Planning: DNR -verified with patient and family  Symptom Management:  Per hospitalist, no additional needs at this time.  Palliative Prophylaxis:  Frequent Pain Assessment and Oral Care  Additional Recommendations (Limitations, Scope, Preferences): Continue to treat the treatable but no CPR or intubation.  Psycho-social/Spiritual:  Desire for further Chaplaincy support:no Additional Recommendations: Caregiving  Support/Resources and Education on Hospice  Prognosis:  Unable to determine, based on outcomes.  Guarded at this point.  6 months or less would not be surprising based on 3 hospital stays in the last 6 months, decreasing functional status, chronic illness burden.  Discharge Planning:  To be determined, based on outcomes.  Prefers to return home with home health services already in place.       Primary Diagnoses: Present on Admission:  Acute respiratory failure (HCC)  Bronchogenic cancer of right lung (HCC)  Acute heart failure with preserved ejection fraction (HFpEF) (HCC)  Pleural effusion on left  Cigarette smoker  Closed left hip fracture (HCC)  COPD (chronic obstructive pulmonary disease) (HCC)  DNR (do not resuscitate)  GI hemorrhage at cecal ulcer s/p colectomy/ileostomy 01/17/2021  HTN (hypertension)  Hypokalemia  Hyponatremia  Microcytic anemia  Mixed hyperlipidemia   I have reviewed the medical record, interviewed the patient and family, and examined the patient. The following aspects are pertinent.  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   Benign neoplasm of ascending colon    Benign neoplasm of cecum    Cavitating mass in right upper lung lobe 01/02/2023   Chronic airway obstruction, not elsewhere classified    Complication of anesthesia    COPD (chronic obstructive pulmonary disease) (HCC)    De Quervain's disease (tenosynovitis) 03/14/2014   Diverticulosis    External hemorrhoids 03/08/2013   GI hemorrhage from post-treatment cecal ulcer 08/18/2012   Heart murmur    systolic with bil rad into carotids   Hemorrhoids    History of radiation therapy    5 cylces stereotactic body radiotherapy   HTN (hypertension)    Hypercholesteremia    Hyponatremia 07/19/2023   Lower GI bleed multiple   AVM's   Lung cancer (HCC) 02/14/2023   Lung mass 02/05/2023   Oxygen deficiency    PONV (postoperative nausea and vomiting)  Pressure injury of skin 07/19/2023   Prolonged QT interval 08/30/2023   Severe sepsis (HCC) 08/30/2023   Social History   Socioeconomic History   Marital status: Widowed    Spouse name: Not on file   Number of children: 1   Years of education: Not on file   Highest education level: Not on file  Occupational History   Occupation: retired    Associate Professor: RETIRED  Tobacco Use   Smoking status: Every Day    Current packs/day: 1.00    Average packs/day: 1 pack/day for 59.0 years (59.0 ttl pk-yrs)    Types: Cigarettes   Smokeless tobacco: Never   Tobacco comments:    Started smoking at age 78  Vaping Use   Vaping status: Never Used  Substance and Sexual Activity   Alcohol use: No   Drug use: No   Sexual activity: Never  Other Topics Concern   Not on file  Social History Narrative   Lives Alone.  A Nephew checks in on her.    Has an elderly dog that is precious to patient.    Daughter died 2021-11-17   (+) CaneSmoke less than pack per day since age 20   Social Determinants of Health   Financial Resource Strain: Not on file  Food Insecurity: No Food Insecurity  (10/20/2023)   Hunger Vital Sign    Worried About Running Out of Food in the Last Year: Never true    Ran Out of Food in the Last Year: Never true  Transportation Needs: No Transportation Needs (10/20/2023)   PRAPARE - Administrator, Civil Service (Medical): No    Lack of Transportation (Non-Medical): No  Physical Activity: Not on file  Stress: Not on file  Social Connections: Not on file   Family History  Problem Relation Age of Onset   Breast cancer Mother    Lung cancer Father    Breast cancer Sister    Anuerysm Brother        in stomach   Colon cancer Neg Hx    Esophageal cancer Neg Hx    Rectal cancer Neg Hx    Scheduled Meds:  furosemide  40 mg Intravenous Q12H   heparin  5,000 Units Subcutaneous Q8H   methylPREDNISolone (SOLU-MEDROL) injection  40 mg Intravenous Q12H   sodium chloride flush  3 mL Intravenous Q12H   sodium chloride flush  3 mL Intravenous Q12H   Continuous Infusions: PRN Meds:.acetaminophen **OR** acetaminophen, bisacodyl, hydrALAZINE, HYDROmorphone (DILAUDID) injection, ipratropium, levalbuterol, ondansetron **OR** ondansetron (ZOFRAN) IV, oxyCODONE, senna-docusate, sodium phosphate, traZODone Medications Prior to Admission:  Prior to Admission medications   Medication Sig Start Date End Date Taking? Authorizing Provider  acetaminophen (TYLENOL) 500 MG tablet Take 2 tablets (1,000 mg total) by mouth every 6 (six) hours as needed for moderate pain or mild pain. 02/14/23  Yes Leslye Peer, MD  ECHINACEA PO Take 167 mg by mouth daily.   Yes [provider]  Ferrous Sulfate 27 MG TABS Take 54 mg by mouth daily with breakfast.   Yes [provider]  fluticasone-salmeterol (ADVAIR) 100-50 MCG/ACT AEPB Inhale 1 puff into the lungs 2 (two) times daily. 09/30/23  Yes Emokpae, Courage, MD  furosemide (LASIX) 20 MG tablet Take 1 tablet (20 mg total) by mouth daily. 09/30/23 09/29/24 Yes Emokpae, Courage, MD  metoprolol tartrate  (LOPRESSOR) 25 MG tablet Take 0.5 tablets (12.5 mg total) by mouth 2 (two) times daily. 09/06/23  Yes Sharee Holster, NP  Multiple Vitamins-Minerals (MULTIVITAMIN WITH MINERALS) tablet Take 1 tablet by mouth daily. Centrum Silver   Yes [provider]  Omega-3 Fatty Acids (FISH OIL) 1000 MG CAPS Take 1,000 Units by mouth daily.   Yes [provider]  oxyCODONE (OXY IR/ROXICODONE) 5 MG immediate release tablet Take 0.5 tablets (2.5 mg total) by mouth every 6 (six) hours as needed for moderate pain (pain score 4-6). 10/12/23  Yes Oliver Barre, MD  Potassium 99 MG TABS Take 1 tablet (99 mg total) by mouth daily at 2 PM. 09/06/23  Yes Green, Chong Sicilian, NP  pravastatin (PRAVACHOL) 10 MG tablet Take 1 tablet (10 mg total) by mouth daily. 09/06/23  Yes Sharee Holster, NP  nicotine (NICODERM CQ - DOSED IN MG/24 HOURS) 14 mg/24hr patch Place 1 patch (14 mg total) onto the skin daily. Patient not taking: Reported on 10/20/2023 10/01/23   Shon Hale, MD   No Known Allergies Review of Systems  Unable to perform ROS: Age    Physical Exam Vitals and nursing note reviewed.  Constitutional:      General: She is not in acute distress.    Appearance: She is not ill-appearing.  Cardiovascular:     Rate and Rhythm: Normal rate.  Pulmonary:     Effort: Pulmonary effort is normal. No tachypnea.  Musculoskeletal:     Right lower leg: No edema.     Left lower leg: No edema.  Skin:    General: Skin is warm and dry.  Neurological:     Mental Status: She is alert and oriented to person, place, and time.  Psychiatric:        Mood and Affect: Mood normal.        Behavior: Behavior normal.     Vital Signs: BP (!) 150/73   Pulse 98   Temp 98.3 F (36.8 C) (Oral)   Resp 19   Ht 5\' 1"  (1.549 m)   Wt 42 kg   SpO2 92%   BMI 17.50 kg/m      Pain Score: 0-No pain   SpO2: SpO2: 92 % O2 Device:SpO2: 92 % O2 Flow Rate: .O2 Flow Rate (L/min): 5 L/min  IO: Intake/output  summary:  Intake/Output Summary (Last 24 hours) at 10/20/2023 1427 Last data filed at 10/20/2023 1234 Gross per 24 hour  Intake --  Output 600 ml  Net -600 ml    LBM:   Baseline Weight: Weight: 42 kg Most recent weight: Weight: 42 kg     Palliative Assessment/Data:     Time In: 1400  Time Out: 1515 Time Total: 75 minutes  Greater than 50%  of this time was spent counseling and coordinating care related to the above assessment and plan.  Signed by: Katheran Awe, NP   Please contact Palliative Medicine Team phone at [phone] for questions and concerns.  For individual provider: See Loretha Stapler

## 2023-10-20 NOTE — Assessment & Plan Note (Signed)
-   Monitoring her bleeding

## 2023-10-20 NOTE — Assessment & Plan Note (Signed)
Currently stable, follow-up with Dr. Kirtland Bouchard as an outpatient, status post radiation therapy

## 2023-10-20 NOTE — ED Provider Notes (Signed)
[place] EMERGENCY DEPARTMENT AT [facility] Provider Note   CSN: [id number] Arrival date & time: 10/20/23  0865     History  Chief Complaint  Patient presents with   Chest Pain   Shortness of Breath    Kathleen Pope is a 89 y.o. female.  HPI 89 year old female presents with shortness of breath.  History is from patient and family.  Patient has multiple comorbidities including COPD, CHF, lung cancer, hypertension.  She has been feeling short of breath since yesterday, gradually worsening.  Has a cough with clear sputum.  Has a little bit of ankle swelling but states that is because she did not take her Lasix this morning.  She was discharged from the hospital about a month ago for CHF.  She reports compliance with her Lasix besides this morning.  She has not had a fever.  She did have some on and off chest pain throughout the night but none now. Does not wear oxygen normally but is found to be hypoxic in triage.  Home Medications Prior to Admission medications   Medication Sig Start Date End Date Taking? Authorizing Provider  acetaminophen (TYLENOL) 500 MG tablet Take 2 tablets (1,000 mg total) by mouth every 6 (six) hours as needed for moderate pain or mild pain. 02/14/23  Yes Leslye Peer, MD  ECHINACEA PO Take 167 mg by mouth daily.   Yes [provider]  Ferrous Sulfate 27 MG TABS Take 54 mg by mouth daily with breakfast.   Yes [provider]  fluticasone-salmeterol (ADVAIR) 100-50 MCG/ACT AEPB Inhale 1 puff into the lungs 2 (two) times daily. 09/30/23  Yes Emokpae, Courage, MD  furosemide (LASIX) 20 MG tablet Take 1 tablet (20 mg total) by mouth daily. 09/30/23 09/29/24 Yes Emokpae, Courage, MD  metoprolol tartrate (LOPRESSOR) 25 MG tablet Take 0.5 tablets (12.5 mg total) by mouth 2 (two) times daily. 09/06/23  Yes Sharee Holster, NP  Multiple Vitamins-Minerals (MULTIVITAMIN WITH MINERALS) tablet Take 1 tablet by mouth daily. Centrum Silver   Yes  [provider]  Omega-3 Fatty Acids (FISH OIL) 1000 MG CAPS Take 1,000 Units by mouth daily.   Yes [provider]  oxyCODONE (OXY IR/ROXICODONE) 5 MG immediate release tablet Take 0.5 tablets (2.5 mg total) by mouth every 6 (six) hours as needed for moderate pain (pain score 4-6). 10/12/23  Yes Oliver Barre, MD  Potassium 99 MG TABS Take 1 tablet (99 mg total) by mouth daily at 2 PM. 09/06/23  Yes Green, Chong Sicilian, NP  pravastatin (PRAVACHOL) 10 MG tablet Take 1 tablet (10 mg total) by mouth daily. 09/06/23  Yes Sharee Holster, NP  nicotine (NICODERM CQ - DOSED IN MG/24 HOURS) 14 mg/24hr patch Place 1 patch (14 mg total) onto the skin daily. Patient not taking: Reported on 10/20/2023 10/01/23   Shon Hale, MD      Allergies    Patient has no known allergies.    Review of Systems   Review of Systems  Constitutional:  Negative for fever.  Respiratory:  Positive for cough and shortness of breath.   Cardiovascular:  Positive for chest pain and leg swelling.    Physical Exam Updated Vital Signs BP 138/79   Pulse 98   Temp 98.7 F (37.1 C)   Resp 18   Ht 5\' 1"  (1.549 m)   Wt 42 kg   SpO2 93%   BMI 17.50 kg/m  Physical Exam Vitals and nursing note  reviewed.  Constitutional:      General: She is not in acute distress.    Appearance: She is well-developed. She is not ill-appearing or diaphoretic.  HENT:     Head: Normocephalic and atraumatic.  Cardiovascular:     Rate and Rhythm: Regular rhythm. Tachycardia present.     Heart sounds: Murmur heard.  Pulmonary:     Effort: Pulmonary effort is normal. No tachypnea or accessory muscle usage.     Breath sounds: Normal breath sounds.     Comments: Breathing comfortably on non-rebreather. No overt wheezing on exam. Abdominal:     Palpations: Abdomen is soft.     Tenderness: There is no abdominal tenderness.  Musculoskeletal:     Comments: Mild ankle edema bilaterally  Skin:    General: Skin is warm and  dry.  Neurological:     Mental Status: She is alert.     ED Results / Procedures / Treatments   Labs (all labs ordered are listed, but only abnormal results are displayed) Labs Reviewed  COMPREHENSIVE METABOLIC PANEL - Abnormal; Notable for the following components:      Result Value   Sodium 133 (*)    Potassium 3.4 (*)    Chloride 96 (*)    Glucose, Bld 119 (*)    Albumin 3.4 (*)    Alkaline Phosphatase 140 (*)    All other components within normal limits  BRAIN NATRIURETIC PEPTIDE - Abnormal; Notable for the following components:   B Natriuretic Peptide 807.0 (*)    All other components within normal limits  CBC WITH DIFFERENTIAL/PLATELET - Abnormal; Notable for the following components:   RBC 3.68 (*)    Hemoglobin 11.0 (*)    HCT 34.4 (*)    Platelets 504 (*)    Neutro Abs 8.4 (*)    All other components within normal limits  RESP PANEL BY RT-PCR (RSV, FLU A&B, COVID)  RVPGX2  EXPECTORATED SPUTUM ASSESSMENT W GRAM STAIN, RFLX TO RESP C  MAGNESIUM  PHOSPHORUS  PROTIME-INR  TROPONIN I (HIGH SENSITIVITY)  TROPONIN I (HIGH SENSITIVITY)    EKG EKG Interpretation Date/Time:  Thursday October 20 2023 09:48:44 EST Ventricular Rate:  95 PR Interval:  184 QRS Duration:  89 QT Interval:  375 QTC Calculation: 472 R Axis:   -5  Text Interpretation: Sinus rhythm LVH with secondary repolarization abnormality similar to Nov 2024 Confirmed by Pricilla Loveless [phone]) on 10/20/2023 9:58:06 AM  Radiology DG Chest Portable 1 View  Result Date: 10/20/2023 CLINICAL DATA:  Shortness of breath and hypoxia EXAM: PORTABLE CHEST 1 VIEW COMPARISON:  Chest radiograph dated 09/28/2023 FINDINGS: Low lung volumes with bronchovascular crowding. Increased bilateral interstitial opacities. Similar right mid lung opacity containing metallic clip. Similar to slightly increased small right and increased moderate left pleural effusions. No pneumothorax. Left heart border is obscured. No acute  osseous abnormality. IMPRESSION: 1. Increased bilateral interstitial opacities, likely pulmonary edema. 2. Similar to slightly increased small right and increased moderate left pleural effusions. Electronically Signed   By: Agustin Cree M.D.   On: 10/20/2023 10:16    Procedures Procedures    Medications Ordered in ED Medications  heparin injection 5,000 Units (has no administration in time range)  sodium chloride flush (NS) 0.9 % injection 3 mL (has no administration in time range)  sodium chloride flush (NS) 0.9 % injection 3 mL (has no administration in time range)  acetaminophen (TYLENOL) tablet 650 mg (has no administration in time range)    Or  acetaminophen (TYLENOL) suppository 650 mg (has no administration in time range)  oxyCODONE (Oxy IR/ROXICODONE) immediate release tablet 5 mg (has no administration in time range)  HYDROmorphone (DILAUDID) injection 0.5-1 mg (has no administration in time range)  traZODone (DESYREL) tablet 25 mg (has no administration in time range)  senna-docusate (Senokot-S) tablet 1 tablet (has no administration in time range)  bisacodyl (DULCOLAX) EC tablet 5 mg (has no administration in time range)  sodium phosphate (FLEET) enema 1 enema (has no administration in time range)  ondansetron (ZOFRAN) tablet 4 mg (has no administration in time range)    Or  ondansetron (ZOFRAN) injection 4 mg (has no administration in time range)  ipratropium (ATROVENT) nebulizer solution 0.5 mg (has no administration in time range)  levalbuterol (XOPENEX) nebulizer solution 0.63 mg (has no administration in time range)  hydrALAZINE (APRESOLINE) injection 10 mg (has no administration in time range)  furosemide (LASIX) injection 40 mg (has no administration in time range)  potassium chloride SA (KLOR-CON M) CR tablet 40 mEq (40 mEq Oral Given 10/20/23 1117)  furosemide (LASIX) injection 20 mg (20 mg Intravenous Given 10/20/23 1117)    ED Course/ Medical Decision Making/ A&P                                  Medical Decision Making Amount and/or Complexity of Data Reviewed External Data Reviewed: notes. Labs: ordered.    Details: Elevated BNP.  Mild hypokalemia. Radiology: ordered and independent interpretation performed.    Details: CHF and pleural effusion ECG/medicine tests: ordered and independent interpretation performed.    Details: LVH without acute change from baseline  Risk Prescription drug management. Decision regarding hospitalization.   Patient presents with shortness of breath and hypoxia.  Has recurrent pleural effusion and CHF.  She is not distressed but is requiring a sizable amount of oxygen and right now is on high flow at 8 L.  However she is resting comfortably.  Will start diuresis but she may require recurrent thoracentesis.  However given she is not distressed I do not think she needs this emergently and we can see how she responds to some diuresis.  She will need admission.  Discussed with Dr. Flossie Dibble.         Final Clinical Impression(s) / ED Diagnoses Final diagnoses:  Acute respiratory failure with hypoxia (HCC)  Acute congestive heart failure, unspecified heart failure type [facility])    Rx / DC Orders ED Discharge Orders     None         Pricilla Loveless, MD 10/20/23 1325

## 2023-10-20 NOTE — ED Notes (Signed)
ED TO INPATIENT HANDOFF REPORT ED Nurse Name and Phone #: [phone]  S Name/Age/Gender Kathleen Pope 89 y.o. female Room/Bed: APA11/APA11  Code Status   Code Status: Limited: Do not attempt resuscitation (DNR) -DNR-LIMITED -Do Not Intubate/DNI   Home/SNF/Other Home Patient oriented to: self, place, time, and situation Is this baseline? Yes   Triage Complete: Triage complete  Chief Complaint Acute respiratory failure (HCC) [J96.00] SOB (shortness of breath) [R06.02]  Triage Note Pt called son this am due to shortness of breath, chest pain, intermittent hot flashes and weakness. Pt reports she was awake all night.   Allergies No Known Allergies  Level of Care/Admitting Diagnosis ED Disposition     ED Disposition  Admit   Condition  --   Comment  Hospital Area: [facility] [[id number]]  Level of Care: Telemetry [5]  Covid Evaluation: Asymptomatic - no recent exposure (last 10 days) testing not required  Diagnosis: SOB (shortness of breath) [119147]  Admitting Physician: Felipa Furnace  Attending Physician: Kendell Bane [phone]  Certification:: I certify this patient will need inpatient services for at least 2 midnights          B Medical/Surgery History Past Medical History:  Diagnosis Date   Acute blood loss anemia 08/18/2012   Acute hypoxic respiratory failure (HCC) 05/12/2022   Anemia    pt denies, yet takes iron   AVM (arteriovenous malformation) of colon 08/18/2012   Benign neoplasm of ascending colon    Benign neoplasm of cecum    Cavitating mass in right upper lung lobe 01/02/2023   Chronic airway obstruction, not elsewhere classified    Complication of anesthesia    COPD (chronic obstructive pulmonary disease) (HCC)    De Quervain's disease (tenosynovitis) 03/14/2014   Diverticulosis    External hemorrhoids 03/08/2013   GI hemorrhage from post-treatment cecal ulcer 08/18/2012   Heart murmur    systolic with bil rad into  carotids   Hemorrhoids    History of radiation therapy    5 cylces stereotactic body radiotherapy   HTN (hypertension)    Hypercholesteremia    Hyponatremia 07/19/2023   Lower GI bleed multiple   AVM's   Lung cancer (HCC) 02/14/2023   Lung mass 02/05/2023   Oxygen deficiency    PONV (postoperative nausea and vomiting)    Pressure injury of skin 07/19/2023   Prolonged QT interval 08/30/2023   Severe sepsis (HCC) 08/30/2023   Past Surgical History:  Procedure Laterality Date   BRONCHIAL BIOPSY  02/14/2023   Procedure: BRONCHIAL BIOPSIES;  Surgeon: Leslye Peer, MD;  Location: [facility] ENDOSCOPY;  Service: Pulmonary;;   BRONCHIAL BRUSHINGS  02/14/2023   Procedure: BRONCHIAL BRUSHINGS;  Surgeon: Leslye Peer, MD;  Location: [facility] ENDOSCOPY;  Service: Pulmonary;;   BRONCHIAL NEEDLE ASPIRATION BIOPSY  02/14/2023   Procedure: BRONCHIAL NEEDLE ASPIRATION BIOPSIES;  Surgeon: Leslye Peer, MD;  Location: [facility] ENDOSCOPY;  Service: Pulmonary;;   BRONCHIAL WASHINGS  02/14/2023   Procedure: BRONCHIAL WASHINGS;  Surgeon: Leslye Peer, MD;  Location: [facility] ENDOSCOPY;  Service: Pulmonary;;   BUNIONECTOMY     left   COLECTOMY     COLON RESECTION N/A 01/17/2021   Procedure: EXPLORATORY LAPAROTOMY COMPLETION COLECTOMY ileostomy;  Surgeon: Axel Filler, MD;  Location: [facility] ORS;  Service: General;  Laterality: N/A;   COLONOSCOPY  08/10/2012   Procedure: COLONOSCOPY;  Surgeon: Meryl Dare, MD,FACG;  Location: [facility] ENDOSCOPY;  Service: Endoscopy;  Laterality: N/A;   COLONOSCOPY  08/19/2012  Procedure: COLONOSCOPY;  Surgeon: Iva Boop, MD;  Location: [facility] ENDOSCOPY;  Service: Endoscopy;  Laterality: N/A;   COLONOSCOPY N/A 03/11/2013   Procedure: COLONOSCOPY;  Surgeon: Hilarie Fredrickson, MD;  Location: [facility] ENDOSCOPY;  Service: Endoscopy;  Laterality: N/A;   COLONOSCOPY N/A 01/16/2021   Procedure: COLONOSCOPY;  Surgeon: Sherrilyn Rist, MD;  Location: [facility] ENDOSCOPY;  Service: Gastroenterology;   Laterality: N/A;   COLONOSCOPY WITH PROPOFOL N/A 01/10/2021   Procedure: COLONOSCOPY WITH PROPOFOL;  Surgeon: Hilarie Fredrickson, MD;  Location: [facility] ENDOSCOPY;  Service: Endoscopy;  Laterality: N/A;   COLOSTOMY N/A 01/15/2021   Procedure: COLOSTOMY;  Surgeon: Harriette Bouillon, MD;  Location: [facility] ORS;  Service: General;  Laterality: N/A;   ESOPHAGOGASTRODUODENOSCOPY (EGD) WITH PROPOFOL N/A 01/14/2021   Procedure: ESOPHAGOGASTRODUODENOSCOPY (EGD) WITH PROPOFOL;  Surgeon: Sherrilyn Rist, MD;  Location: [facility] ENDOSCOPY;  Service: Endoscopy;  Laterality: N/A;   FIDUCIAL MARKER PLACEMENT  02/14/2023   Procedure: FIDUCIAL MARKER PLACEMENT;  Surgeon: Leslye Peer, MD;  Location: [facility] ENDOSCOPY;  Service: Pulmonary;;   FLEXIBLE SIGMOIDOSCOPY N/A 01/14/2021   Procedure: Arnell Sieving;  Surgeon: Sherrilyn Rist, MD;  Location: [facility]OS[facility];  Service: Endoscopy;  Laterality: N/A;   HIP ARTHROPLASTY Left 08/31/2023   Procedure: ARTHROPLASTY BIPOLAR HIP (HEMIARTHROPLASTY);  Surgeon: Oliver Barre, MD;  Location: [facility];  Service: Orthopedics;  Laterality: Left;   HOT HEMOSTASIS N/A 01/10/2021   Procedure: HOT HEMOSTASIS (ARGON PLASMA COAGULATION/BICAP);  Surgeon: Hilarie Fredrickson, MD;  Location: [facility] ENDOSCOPY;  Service: Endoscopy;  Laterality: N/A;   IR ANGIOGRAM SELECTIVE EACH ADDITIONAL VESSEL  01/12/2021   IR ANGIOGRAM SELECTIVE EACH ADDITIONAL VESSEL  01/12/2021   IR ANGIOGRAM SELECTIVE EACH ADDITIONAL VESSEL  01/12/2021   IR ANGIOGRAM VISCERAL SELECTIVE  01/09/2021   IR ANGIOGRAM VISCERAL SELECTIVE  01/12/2021   IR EMBO ARTERIAL NOT HEMORR HEMANG INC GUIDE ROADMAPPING  01/12/2021   IR US GUIDE VASC ACCESS RIGHT  01/09/2021   IR US GUIDE VASC ACCESS RIGHT  01/12/2021   JOINT REPLACEMENT     PARTIAL COLECTOMY N/A 01/15/2021   Procedure: PARTIAL COLECTOMY;  Surgeon: Harriette Bouillon, MD;  Location: [facility];  Service: General;  Laterality: N/A;   POLYPECTOMY  01/10/2021   Procedure: POLYPECTOMY;   Surgeon: Hilarie Fredrickson, MD;  Location: [facility] ENDOSCOPY;  Service: Endoscopy;;   SKIN CANCER EXCISION Right 12/2022   Right Leg above knee   TONSILLECTOMY     VIDEO BRONCHOSCOPY WITH ENDOBRONCHIAL ULTRASOUND Bilateral 02/14/2023   Procedure: VIDEO BRONCHOSCOPY WITH ENDOBRONCHIAL ULTRASOUND;  Surgeon: Leslye Peer, MD;  Location: [facility] ENDOSCOPY;  Service: Pulmonary;  Laterality: Bilateral;   VIDEO BRONCHOSCOPY WITH RADIAL ENDOBRONCHIAL ULTRASOUND  02/14/2023   Procedure: VIDEO BRONCHOSCOPY WITH RADIAL ENDOBRONCHIAL ULTRASOUND;  Surgeon: Leslye Peer, MD;  Location: [facility] ENDOSCOPY;  Service: Pulmonary;;     A IV Location/Drains/Wounds Patient Lines/Drains/Airways Status     Active Line/Drains/Airways     Name Placement date Placement time Site Days   Peripheral IV 10/20/23 20 G Left Antecubital 10/20/23  0940  Antecubital  less than 1   Ileostomy Standard (end) RUQ 01/17/21  0916  RUQ  1006   External Urinary Catheter 10/20/23  1052  --  less than 1   Pressure Injury 07/18/23 Sacrum Posterior Stage 1 -  Intact skin with non-blanchable redness of a localized area usually over a bony prominence. sm pea size area denuded on sacrum 07/18/23  1624  -- 94   Pressure Injury 09/29/23  Coccyx Medial;Mid Stage 2 -  Partial thickness loss of dermis presenting as a shallow open injury with a red, pink wound bed without slough. small round 09/29/23  1100  -- 21            Intake/Output Last 24 hours  Intake/Output Summary (Last 24 hours) at 10/20/2023 1705 Last data filed at 10/20/2023 1234 Gross per 24 hour  Intake 160 ml  Output 600 ml  Net -440 ml    Labs/Imaging Results for orders placed or performed during the hospital encounter of 10/20/23 (from the past 48 hour(s))  Resp panel by RT-PCR (RSV, Flu A&B, Covid) Anterior Nasal Swab     Status: None   Collection Time: 10/20/23  9:36 AM   Specimen: Anterior Nasal Swab  Result Value Ref Range   SARS Coronavirus 2 by RT PCR NEGATIVE NEGATIVE     Comment: (NOTE) SARS-CoV-2 target nucleic acids are NOT DETECTED.  The SARS-CoV-2 RNA is generally detectable in upper respiratory specimens during the acute phase of infection. The lowest concentration of SARS-CoV-2 viral copies this assay can detect is 138 copies/mL. A negative result does not preclude SARS-Cov-2 infection and should not be used as the sole basis for treatment or other patient management decisions. A negative result may occur with  improper specimen collection/handling, submission of specimen other than nasopharyngeal swab, presence of viral mutation(s) within the areas targeted by this assay, and inadequate number of viral copies(<138 copies/mL). A negative result must be combined with clinical observations, patient history, and epidemiological information. The expected result is Negative.  Fact Sheet for Patients:  BloggerCourse.com  Fact Sheet for Healthcare Providers:  SeriousBroker.it  This test is no t yet approved or cleared by the [place] FDA and  has been authorized for detection and/or diagnosis of SARS-CoV-2 by FDA under an Emergency Use Authorization (EUA). This EUA will remain  in effect (meaning this test can be used) for the duration of the COVID-19 declaration under Section 564(b)(1) of the Act, 21 U.S.C.section 360bbb-3(b)(1), unless the authorization is terminated  or revoked sooner.       Influenza A by PCR NEGATIVE NEGATIVE   Influenza B by PCR NEGATIVE NEGATIVE    Comment: (NOTE) The Xpert Xpress SARS-CoV-2/FLU/RSV plus assay is intended as an aid in the diagnosis of influenza from Nasopharyngeal swab specimens and should not be used as a sole basis for treatment. Nasal washings and aspirates are unacceptable for Xpert Xpress SARS-CoV-2/FLU/RSV testing.  Fact Sheet for Patients: BloggerCourse.com  Fact Sheet for Healthcare  Providers: SeriousBroker.it  This test is not yet approved or cleared by the [place] FDA and has been authorized for detection and/or diagnosis of SARS-CoV-2 by FDA under an Emergency Use Authorization (EUA). This EUA will remain in effect (meaning this test can be used) for the duration of the COVID-19 declaration under Section 564(b)(1) of the Act, 21 U.S.C. section 360bbb-3(b)(1), unless the authorization is terminated or revoked.     Resp Syncytial Virus by PCR NEGATIVE NEGATIVE    Comment: (NOTE) Fact Sheet for Patients: BloggerCourse.com  Fact Sheet for Healthcare Providers: SeriousBroker.it  This test is not yet approved or cleared by the [place] FDA and has been authorized for detection and/or diagnosis of SARS-CoV-2 by FDA under an Emergency Use Authorization (EUA). This EUA will remain in effect (meaning this test can be used) for the duration of the COVID-19 declaration under Section 564(b)(1) of the Act, 21 U.S.C. section 360bbb-3(b)(1), unless the authorization is terminated or  revoked.  Performed at [facility], [address], [place], [place] [address]   Comprehensive metabolic panel     Status: Abnormal   Collection Time: 10/20/23  9:38 AM  Result Value Ref Range   Sodium 133 (L) 135 - 145 mmol/L   Potassium 3.4 (L) 3.5 - 5.1 mmol/L   Chloride 96 (L) 98 - 111 mmol/L   CO2 25 22 - 32 mmol/L   Glucose, Bld 119 (H) 70 - 99 mg/dL    Comment: Glucose reference range applies only to samples taken after fasting for at least 8 hours.   BUN 16 8 - 23 mg/dL   Creatinine, Ser 8.29 0.44 - 1.00 mg/dL   Calcium 9.1 8.9 - 56.2 mg/dL   Total Protein 7.0 6.5 - 8.1 g/dL   Albumin 3.4 (L) 3.5 - 5.0 g/dL   AST 25 15 - 41 U/L   ALT 12 0 - 44 U/L   Alkaline Phosphatase 140 (H) 38 - 126 U/L   Total Bilirubin 0.6 <1.2 mg/dL   GFR, Estimated >13 >08 mL/min    Comment: (NOTE) Calculated  using the CKD-EPI Creatinine Equation (2021)    Anion gap 12 5 - 15    Comment: Performed at [facility], [address], [place], [place] [address]  Brain natriuretic peptide     Status: Abnormal   Collection Time: 10/20/23  9:38 AM  Result Value Ref Range   B Natriuretic Peptide 807.0 (H) 0.0 - 100.0 pg/mL    Comment: Performed at [facility], [address] [place], [place] [address]  Troponin I (High Sensitivity)     Status: None   Collection Time: 10/20/23  9:38 AM  Result Value Ref Range   Troponin I (High Sensitivity) 14 <18 ng/L    Comment: (NOTE) Elevated high sensitivity troponin I (hsTnI) values and significant  changes across serial measurements may suggest ACS but many other  chronic and acute conditions are known to elevate hsTnI results.  Refer to the "Links" section for chest pain algorithms and additional  guidance. Performed at [facility], [address], [place], [place] [address]   CBC with Differential     Status: Abnormal   Collection Time: 10/20/23  9:38 AM  Result Value Ref Range   WBC 10.1 4.0 - 10.5 K/uL   RBC 3.68 (L) 3.87 - 5.11 MIL/uL   Hemoglobin 11.0 (L) 12.0 - 15.0 g/dL   HCT 32.4 (L) 40.1 - 02.7 %   MCV 93.5 80.0 - 100.0 fL   MCH 29.9 26.0 - 34.0 pg   MCHC 32.0 30.0 - 36.0 g/dL   RDW 25.3 66.4 - 40.3 %   Platelets 504 (H) 150 - 400 K/uL   nRBC 0.0 0.0 - 0.2 %   Neutrophils Relative % 83 %   Neutro Abs 8.4 (H) 1.7 - 7.7 K/uL   Lymphocytes Relative 7 %   Lymphs Abs 0.7 0.7 - 4.0 K/uL   Monocytes Relative 8 %   Monocytes Absolute 0.8 0.1 - 1.0 K/uL   Eosinophils Relative 1 %   Eosinophils Absolute 0.1 0.0 - 0.5 K/uL   Basophils Relative 0 %   Basophils Absolute 0.0 0.0 - 0.1 K/uL   Immature Granulocytes 1 %   Abs Immature Granulocytes 0.05 0.00 - 0.07 K/uL    Comment: Performed at [facility], [address], [place], [place] [address]  Troponin I (High Sensitivity)     Status: None   Collection Time: 10/20/23 11:14 AM  Result  Value Ref  Range   Troponin I (High Sensitivity) 12 <18 ng/L    Comment: (NOTE) Elevated high sensitivity troponin I (hsTnI) values and significant  changes across serial measurements may suggest ACS but many other  chronic and acute conditions are known to elevate hsTnI results.  Refer to the "Links" section for chest pain algorithms and additional  guidance. Performed at [facility], [address], [place], [place] [address]   Magnesium     Status: None   Collection Time: 10/20/23 11:14 AM  Result Value Ref Range   Magnesium 2.0 1.7 - 2.4 mg/dL    Comment: Performed at [facility], [address] [place], [place] [address]  Phosphorus     Status: None   Collection Time: 10/20/23 11:14 AM  Result Value Ref Range   Phosphorus 3.4 2.5 - 4.6 mg/dL    Comment: Performed at [facility], [address], [place], [place] [address]  Protime-INR     Status: None   Collection Time: 10/20/23 11:14 AM  Result Value Ref Range   Prothrombin Time 13.1 11.4 - 15.2 seconds   INR 1.0 0.8 - 1.2    Comment: (NOTE) INR goal varies based on device and disease states. Performed at [facility], [address], [place], [place] [address]    DG Chest Portable 1 View  Result Date: 10/20/2023 CLINICAL DATA:  Shortness of breath and hypoxia EXAM: PORTABLE CHEST 1 VIEW COMPARISON:  Chest radiograph dated 09/28/2023 FINDINGS: Low lung volumes with bronchovascular crowding. Increased bilateral interstitial opacities. Similar right mid lung opacity containing metallic clip. Similar to slightly increased small right and increased moderate left pleural effusions. No pneumothorax. Left heart border is obscured. No acute osseous abnormality. IMPRESSION: 1. Increased bilateral interstitial opacities, likely pulmonary edema. 2. Similar to slightly increased small right and increased moderate left pleural effusions. Electronically Signed   By: Agustin Cree M.D.   On: 10/20/2023 10:16    Pending Labs Unresulted Labs (From admission,  onward)     Start     Ordered   10/21/23 0500  Basic metabolic panel  Daily,   R      10/20/23 1151   10/21/23 0500  CBC  Daily,   R      10/20/23 1151   10/21/23 0500  Protime-INR  Tomorrow morning,   R        10/20/23 1151   10/21/23 0500  APTT  Tomorrow morning,   R        10/20/23 1151   10/21/23 0500  Brain natriuretic peptide  Daily,   R      10/20/23 1414   10/20/23 1151  Expectorated Sputum Assessment w Gram Stain, Rflx to Resp Cult  ONCE - URGENT,   URGENT       Comments: If productive cough    10/20/23 1151            Vitals/Pain Today's Vitals   10/20/23 1330 10/20/23 1400 10/20/23 1415 10/20/23 1430  BP: (!) 147/83 133/69 (!) 140/85 139/77  Pulse: 98 91 86 86  Resp: (!) 23 15 (!) 21 (!) 22  Temp:      TempSrc:      SpO2: 92% 93% 94% 93%  Weight:      Height:      PainSc:        Isolation Precautions No active isolations  Medications Medications  heparin injection 5,000 Units (5,000 Units Subcutaneous Given 10/20/23 1336)  sodium chloride flush (NS) 0.9 %  injection 3 mL (3 mLs Intravenous Given 10/20/23 1338)  sodium chloride flush (NS) 0.9 % injection 3 mL (3 mLs Intravenous Given 10/20/23 1338)  acetaminophen (TYLENOL) tablet 650 mg (has no administration in time range)    Or  acetaminophen (TYLENOL) suppository 650 mg (has no administration in time range)  oxyCODONE (Oxy IR/ROXICODONE) immediate release tablet 5 mg (has no administration in time range)  HYDROmorphone (DILAUDID) injection 0.5-1 mg (has no administration in time range)  traZODone (DESYREL) tablet 25 mg (has no administration in time range)  senna-docusate (Senokot-S) tablet 1 tablet (has no administration in time range)  bisacodyl (DULCOLAX) EC tablet 5 mg (has no administration in time range)  sodium phosphate (FLEET) enema 1 enema (has no administration in time range)  ondansetron (ZOFRAN) tablet 4 mg (has no administration in time range)    Or  ondansetron (ZOFRAN) injection 4 mg  (has no administration in time range)  ipratropium (ATROVENT) nebulizer solution 0.5 mg (has no administration in time range)  levalbuterol (XOPENEX) nebulizer solution 0.63 mg (has no administration in time range)  hydrALAZINE (APRESOLINE) injection 10 mg (has no administration in time range)  furosemide (LASIX) injection 40 mg (40 mg Intravenous Given 10/20/23 1336)  methylPREDNISolone sodium succinate (SOLU-MEDROL) 40 mg/mL injection 40 mg (40 mg Intravenous Given 10/20/23 1336)  potassium chloride SA (KLOR-CON M) CR tablet 40 mEq (40 mEq Oral Given 10/20/23 1117)  furosemide (LASIX) injection 20 mg (20 mg Intravenous Given 10/20/23 1117)    Mobility walks with device     Focused Assessments    R Recommendations: See Admitting Provider Note  Report given to:   Additional Notes:

## 2023-10-20 NOTE — Assessment & Plan Note (Signed)
-   Functional, monitoring

## 2023-10-20 NOTE — Assessment & Plan Note (Addendum)
Multifactorial including heart failure HFpEF, increase left pleural effusion Underlying COPD -with history of underlying lung Kecia -Continue with IV Lasix -Bronchodilators scheduled and as needed -IV steroids with quick taper -Pursuing with possible US thoracentesis

## 2023-10-20 NOTE — Assessment & Plan Note (Signed)
-   Monitoring closely -Continue iron supplements

## 2023-10-20 NOTE — ED Triage Notes (Signed)
Pt called son this am due to shortness of breath, chest pain, intermittent hot flashes and weakness. Pt reports she was awake all night.

## 2023-10-20 NOTE — Hospital Course (Signed)
Kathleen Pope is a 89 year old female with chronic history of RT lung CA under treatment s/p radiation therapy, COPD, not O2 dependent, longtime smoker, HFp EF, HTN, HLD, ileostomy, s/p left hip arthroplasty 10/16.... Presented once again to the ED with chief complaint of shortness of breath lower extremity edema. Patient symptoms progressed to getting worse since yesterday.  Associated with cough but clear sputum.  Progressively worsening lower extremity edema and ankle area, did not take her Lasix this morning, states she is compliant with her Lasix.  Denies any fever chills intermittent chest pain with cough at night. Not O2 dependent at home   ED course: Blood pressure (!) 142/127, pulse (!) 102, temperature 98.7 F (37.1 C), RR (!) 24, height 5\' 1"  (1.549 m), weight 42 kg, SpO2 94%.  Sodium 133, potassium 3.4, chloride 96, glucose 119, alk phos 140, albumin 3.4, BNP 756>> 807 Troponin 14 WBC 10.1 hemoglobin 11.0, Respiratory panel, influenza A, B, SARS-CoV-2 negative  Chest x-ray: IMPRESSION: 1. Increased bilateral interstitial opacities, likely pulmonary edema. 2. Similar to slightly increased small right and increased moderate left pleural effusions  Course of patient to admitted to acute respiratory failure likely due to pulmonary edema, increased left pleural effusion

## 2023-10-20 NOTE — Assessment & Plan Note (Signed)
Confirmed CODE STATUS

## 2023-10-20 NOTE — Assessment & Plan Note (Addendum)
-   Recent history of left hip arthroplasty 08/31/2023 by Dr. Dallas Schimke -Remained stable

## 2023-10-20 NOTE — Assessment & Plan Note (Signed)
-  Advised to quit smoking -NicoDerm patch provided

## 2023-10-20 NOTE — H&P (Signed)
History and Physical   Patient: Kathleen Pope                            PCP: Elfredia Nevins, MD                    DOB: [birth year]            DOA: 10/20/2023 PIR:[id number]             DOS: 10/20/2023, 1:30 PM  Elfredia Nevins, MD  Patient coming from:   HOME  I have personally reviewed patient's medical records, in electronic medical records, including:  [facility], and care everywhere.    Chief Complaint:   Chief Complaint  Patient presents with   Chest Pain   Shortness of Breath    History of present illness:    Kathleen Pope is a 89 year old female with chronic history of RT lung CA under treatment s/p radiation therapy, COPD, not O2 dependent, longtime smoker, HFp EF, HTN, HLD, ileostomy, s/p left hip arthroplasty 10/16.... Presented once again to the ED with chief complaint of shortness of breath lower extremity edema. Patient symptoms progressed to getting worse since yesterday.  Associated with cough but clear sputum.  Progressively worsening lower extremity edema and ankle area, did not take her Lasix this morning, states she is compliant with her Lasix.  Denies any fever chills intermittent chest pain with cough at night. Not O2 dependent at home   ED course: Blood pressure (!) 142/127, pulse (!) 102, temperature 98.7 F (37.1 C), RR (!) 24, height 5\' 1"  (1.549 m), weight 42 kg, SpO2 94%.  Sodium 133, potassium 3.4, chloride 96, glucose 119, alk phos 140, albumin 3.4, BNP 756>> 807 Troponin 14 WBC 10.1 hemoglobin 11.0, Respiratory panel, influenza A, B, SARS-CoV-2 negative  Chest x-ray: IMPRESSION: 1. Increased bilateral interstitial opacities, likely pulmonary edema. 2. Similar to slightly increased small right and increased moderate left pleural effusions  Course of patient to admitted to acute respiratory failure likely due to pulmonary edema, increased left pleural effusion   Patient Denies having: Fever, Chills, Chest Pain, Abd pain, N/V/D,  headache, dizziness, lightheadedness,  Dysuria, Joint pain, rash, open wounds    Review of Systems: As per HPI, otherwise 10 point review of systems were negative.   ----------------------------------------------------------------------------------------------------------------------  No Known Allergies  Home MEDs:  Prior to Admission medications   Medication Sig Start Date End Date Taking? Authorizing Provider  acetaminophen (TYLENOL) 500 MG tablet Take 2 tablets (1,000 mg total) by mouth every 6 (six) hours as needed for moderate pain or mild pain. 02/14/23  Yes Leslye Peer, MD  ECHINACEA PO Take 167 mg by mouth daily.   Yes [provider]  Ferrous Sulfate 27 MG TABS Take 54 mg by mouth daily with breakfast.   Yes [provider]  fluticasone-salmeterol (ADVAIR) 100-50 MCG/ACT AEPB Inhale 1 puff into the lungs 2 (two) times daily. 09/30/23  Yes Emokpae, Courage, MD  furosemide (LASIX) 20 MG tablet Take 1 tablet (20 mg total) by mouth daily. 09/30/23 09/29/24 Yes Emokpae, Courage, MD  metoprolol tartrate (LOPRESSOR) 25 MG tablet Take 0.5 tablets (12.5 mg total) by mouth 2 (two) times daily. 09/06/23  Yes Sharee Holster, NP  Multiple Vitamins-Minerals (MULTIVITAMIN WITH MINERALS) tablet Take 1 tablet by mouth daily. Centrum Silver   Yes [provider]  Omega-3 Fatty Acids (FISH OIL) 1000 MG CAPS Take 1,000 Units  by mouth daily.   Yes [provider]  oxyCODONE (OXY IR/ROXICODONE) 5 MG immediate release tablet Take 0.5 tablets (2.5 mg total) by mouth every 6 (six) hours as needed for moderate pain (pain score 4-6). 10/12/23  Yes Oliver Barre, MD  Potassium 99 MG TABS Take 1 tablet (99 mg total) by mouth daily at 2 PM. 09/06/23  Yes Green, Chong Sicilian, NP  pravastatin (PRAVACHOL) 10 MG tablet Take 1 tablet (10 mg total) by mouth daily. 09/06/23  Yes Sharee Holster, NP  nicotine (NICODERM CQ - DOSED IN MG/24 HOURS) 14 mg/24hr patch Place 1 patch (14 mg  total) onto the skin daily. Patient not taking: Reported on 10/20/2023 10/01/23   Shon Hale, MD    PRN MEDs: acetaminophen **OR** acetaminophen, bisacodyl, hydrALAZINE, HYDROmorphone (DILAUDID) injection, ipratropium, levalbuterol, ondansetron **OR** ondansetron (ZOFRAN) IV, oxyCODONE, senna-docusate, sodium phosphate, traZODone  Past Medical History:  Diagnosis Date   Acute blood loss anemia 08/18/2012   Acute hypoxic respiratory failure (HCC) 05/12/2022   Anemia    pt denies, yet takes iron   AVM (arteriovenous malformation) of colon 08/18/2012   Benign neoplasm of ascending colon    Benign neoplasm of cecum    Cavitating mass in right upper lung lobe 01/02/2023   Chronic airway obstruction, not elsewhere classified    Complication of anesthesia    COPD (chronic obstructive pulmonary disease) (HCC)    De Quervain's disease (tenosynovitis) 03/14/2014   Diverticulosis    External hemorrhoids 03/08/2013   GI hemorrhage from post-treatment cecal ulcer 08/18/2012   Heart murmur    systolic with bil rad into carotids   Hemorrhoids    History of radiation therapy    5 cylces stereotactic body radiotherapy   HTN (hypertension)    Hypercholesteremia    Hyponatremia 07/19/2023   Lower GI bleed multiple   AVM's   Lung cancer (HCC) 02/14/2023   Lung mass 02/05/2023   Oxygen deficiency    PONV (postoperative nausea and vomiting)    Pressure injury of skin 07/19/2023   Prolonged QT interval 08/30/2023   Severe sepsis (HCC) 08/30/2023    Past Surgical History:  Procedure Laterality Date   BRONCHIAL BIOPSY  02/14/2023   Procedure: BRONCHIAL BIOPSIES;  Surgeon: Leslye Peer, MD;  Location: [facility];  Service: Pulmonary;;   BRONCHIAL BRUSHINGS  02/14/2023   Procedure: BRONCHIAL BRUSHINGS;  Surgeon: Leslye Peer, MD;  Location: [facility];  Service: Pulmonary;;   BRONCHIAL NEEDLE ASPIRATION BIOPSY  02/14/2023   Procedure: BRONCHIAL NEEDLE ASPIRATION BIOPSIES;   Surgeon: Leslye Peer, MD;  Location: [facility];  Service: Pulmonary;;   BRONCHIAL WASHINGS  02/14/2023   Procedure: BRONCHIAL WASHINGS;  Surgeon: Leslye Peer, MD;  Location: [facility] ENDOSCOPY;  Service: Pulmonary;;   BUNIONECTOMY     left   COLECTOMY     COLON RESECTION N/A 01/17/2021   Procedure: EXPLORATORY LAPAROTOMY COMPLETION COLECTOMY ileostomy;  Surgeon: Axel Filler, MD;  Location: [facility];  Service: General;  Laterality: N/A;   COLONOSCOPY  08/10/2012   Procedure: COLONOSCOPY;  Surgeon: Meryl Dare, MD,FACG;  Location: [facility];  Service: Endoscopy;  Laterality: N/A;   COLONOSCOPY  08/19/2012   Procedure: COLONOSCOPY;  Surgeon: Iva Boop, MD;  Location: [facility];  Service: Endoscopy;  Laterality: N/A;   COLONOSCOPY N/A 03/11/2013   Procedure: COLONOSCOPY;  Surgeon: Hilarie Fredrickson, MD;  Location: [facility];  Service: Endoscopy;  Laterality: N/A;   COLONOSCOPY N/A 01/16/2021   Procedure: COLONOSCOPY;  Surgeon: Sherrilyn Rist, MD;  Location: [facility];  Service: Gastroenterology;  Laterality: N/A;   COLONOSCOPY WITH PROPOFOL N/A 01/10/2021   Procedure: COLONOSCOPY WITH PROPOFOL;  Surgeon: Hilarie Fredrickson, MD;  Location: [facility];  Service: Endoscopy;  Laterality: N/A;   COLOSTOMY N/A 01/15/2021   Procedure: COLOSTOMY;  Surgeon: Harriette Bouillon, MD;  Location: [facility];  Service: General;  Laterality: N/A;   ESOPHAGOGASTRODUODENOSCOPY (EGD) WITH PROPOFOL N/A 01/14/2021   Procedure: ESOPHAGOGASTRODUODENOSCOPY (EGD) WITH PROPOFOL;  Surgeon: Sherrilyn Rist, MD;  Location: [facility];  Service: Endoscopy;  Laterality: N/A;   FIDUCIAL MARKER PLACEMENT  02/14/2023   Procedure: FIDUCIAL MARKER PLACEMENT;  Surgeon: Leslye Peer, MD;  Location: [facility] ENDOSCOPY;  Service: Pulmonary;;   FLEXIBLE SIGMOIDOSCOPY N/A 01/14/2021   Procedure: Arnell Sieving;  Surgeon: Sherrilyn Rist, MD;  Location: [facility];  Service: Endoscopy;  Laterality: N/A;    HIP ARTHROPLASTY Left 08/31/2023   Procedure: ARTHROPLASTY BIPOLAR HIP (HEMIARTHROPLASTY);  Surgeon: Oliver Barre, MD;  Location: [facility];  Service: Orthopedics;  Laterality: Left;   HOT HEMOSTASIS N/A 01/10/2021   Procedure: HOT HEMOSTASIS (ARGON PLASMA COAGULATION/BICAP);  Surgeon: Hilarie Fredrickson, MD;  Location: [facility];  Service: Endoscopy;  Laterality: N/A;   IR ANGIOGRAM SELECTIVE EACH ADDITIONAL VESSEL  01/12/2021   IR ANGIOGRAM SELECTIVE EACH ADDITIONAL VESSEL  01/12/2021   IR ANGIOGRAM SELECTIVE EACH ADDITIONAL VESSEL  01/12/2021   IR ANGIOGRAM VISCERAL SELECTIVE  01/09/2021   IR ANGIOGRAM VISCERAL SELECTIVE  01/12/2021   IR EMBO ARTERIAL NOT HEMORR HEMANG INC GUIDE ROADMAPPING  01/12/2021   IR US GUIDE VASC ACCESS RIGHT  01/09/2021   IR US GUIDE VASC ACCESS RIGHT  01/12/2021   JOINT REPLACEMENT     PARTIAL COLECTOMY N/A 01/15/2021   Procedure: PARTIAL COLECTOMY;  Surgeon: Harriette Bouillon, MD;  Location: [facility];  Service: General;  Laterality: N/A;   POLYPECTOMY  01/10/2021   Procedure: POLYPECTOMY;  Surgeon: Hilarie Fredrickson, MD;  Location: [facility];  Service: Endoscopy;;   SKIN CANCER EXCISION Right 12/2022   Right Leg above knee   TONSILLECTOMY     VIDEO BRONCHOSCOPY WITH ENDOBRONCHIAL ULTRASOUND Bilateral 02/14/2023   Procedure: VIDEO BRONCHOSCOPY WITH ENDOBRONCHIAL ULTRASOUND;  Surgeon: Leslye Peer, MD;  Location: [facility];  Service: Pulmonary;  Laterality: Bilateral;   VIDEO BRONCHOSCOPY WITH RADIAL ENDOBRONCHIAL ULTRASOUND  02/14/2023   Procedure: VIDEO BRONCHOSCOPY WITH RADIAL ENDOBRONCHIAL ULTRASOUND;  Surgeon: Leslye Peer, MD;  Location: [facility];  Service: Pulmonary;;     reports that she has been smoking cigarettes. She has a 59 pack-year smoking history. She has never used smokeless tobacco. She reports that she does not drink alcohol and does not use drugs.   Family History  Problem Relation Age of Onset   Breast cancer Mother    Lung  cancer Father    Breast cancer Sister    Anuerysm Brother        in stomach   Colon cancer Neg Hx    Esophageal cancer Neg Hx    Rectal cancer Neg Hx     Physical Exam:   Vitals:   10/20/23 1200 10/20/23 1230 10/20/23 1245 10/20/23 1320  BP: 138/79 (!) 143/77 (!) 150/73   Pulse: 98 93 98   Resp: 18 (!) 22 19   Temp:    98.3 F (36.8 C)  TempSrc:    Oral  SpO2: 93% 93% 92%   Weight:      Height:  Constitutional: NAD, calm, comfortable Eyes: PERRL, lids and conjunctivae normal ENMT: Mucous membranes are moist. Posterior pharynx clear of any exudate or lesions.Normal dentition.  Neck: normal, supple, no masses, no thyromegaly Respiratory:  BS auscultation bilaterally, mild wheezing, no crackles. Normal respiratory effort. No accessory muscle use.  Cardiovascular: Regular rate and rhythm, no murmurs / rubs / gallops. No extremity edema. 2+ pedal pulses. No carotid bruits.  Abdomen: no tenderness, no masses palpated. No hepatosplenomegaly. Bowel sounds positive.  Musculoskeletal: no clubbing / cyanosis. No joint deformity upper and lower extremities. Good ROM, no contractures. Normal muscle tone.  Neurologic: CN II-XII grossly intact. Sensation intact, DTR normal. Strength 5/5 in all 4.  Psychiatric: Normal judgment and insight. Alert and oriented x 3. Normal mood.  Skin: no rashes, lesions, ulcers. No induration   Pressure Injury 07/18/23 Sacrum Posterior Stage 1 -  Intact skin with non-blanchable redness of a localized area usually over a bony prominence. sm pea size area denuded on sacrum (Active)  07/18/23 1624  Location: Sacrum  Location Orientation: Posterior  Staging: Stage 1 -  Intact skin with non-blanchable redness of a localized area usually over a bony prominence.  Wound Description (Comments): sm pea size area denuded on sacrum  Present on Admission: Yes     Pressure Injury 09/29/23 Coccyx Medial;Mid Stage 2 -  Partial thickness loss of dermis presenting as a  shallow open injury with a red, pink wound bed without slough. small round (Active)  09/29/23 1100  Location: Coccyx  Location Orientation: Medial;Mid  Staging: Stage 2 -  Partial thickness loss of dermis presenting as a shallow open injury with a red, pink wound bed without slough.  Wound Description (Comments): small round  Present on Admission: No  Dressing Type Foam - Lift dressing to assess site every shift 09/29/23 1100         Labs on admission:    I have personally reviewed following labs and imaging studies  CBC: Recent Labs  Lab 10/20/23 0938  WBC 10.1  NEUTROABS 8.4*  HGB 11.0*  HCT 34.4*  MCV 93.5  PLT 504*   Basic Metabolic Panel: Recent Labs  Lab 10/20/23 0938 10/20/23 1114  NA 133*  --   K 3.4*  --   CL 96*  --   CO2 25  --   GLUCOSE 119*  --   BUN 16  --   CREATININE 0.69  --   CALCIUM 9.1  --   MG  --  2.0  PHOS  --  3.4   GFR: Estimated Creatinine Clearance: 32.2 mL/min (by C-G formula based on SCr of 0.69 mg/dL). Liver Function Tests: Recent Labs  Lab 10/20/23 0938  AST 25  ALT 12  ALKPHOS 140*  BILITOT 0.6  PROT 7.0  ALBUMIN 3.4*   No results for input(s): "LIPASE", "AMYLASE" in the last 168 hours. No results for input(s): "AMMONIA" in the last 168 hours. Coagulation Profile: Recent Labs  Lab 10/20/23 1114  INR 1.0    Urine analysis:    Component Value Date/Time   COLORURINE YELLOW 08/30/2023 2220   APPEARANCEUR CLEAR 08/30/2023 2220   LABSPEC 1.014 08/30/2023 2220   PHURINE 6.0 08/30/2023 2220   GLUCOSEU 50 (A) 08/30/2023 2220   HGBUR NEGATIVE 08/30/2023 2220   BILIRUBINUR NEGATIVE 08/30/2023 2220   KETONESUR 5 (A) 08/30/2023 2220   PROTEINUR 30 (A) 08/30/2023 2220   UROBILINOGEN 1.0 03/09/2013 2028   NITRITE NEGATIVE 08/30/2023 2220   LEUKOCYTESUR NEGATIVE 08/30/2023 2220  Last A1C:  Lab Results  Component Value Date   HGBA1C 7.4 (H) 01/14/2021     Radiologic Exams on Admission:   DG Chest Portable 1  View  Result Date: 10/20/2023 CLINICAL DATA:  Shortness of breath and hypoxia EXAM: PORTABLE CHEST 1 VIEW COMPARISON:  Chest radiograph dated 09/28/2023 FINDINGS: Low lung volumes with bronchovascular crowding. Increased bilateral interstitial opacities. Similar right mid lung opacity containing metallic clip. Similar to slightly increased small right and increased moderate left pleural effusions. No pneumothorax. Left heart border is obscured. No acute osseous abnormality. IMPRESSION: 1. Increased bilateral interstitial opacities, likely pulmonary edema. 2. Similar to slightly increased small right and increased moderate left pleural effusions. Electronically Signed   By: Agustin Cree M.D.   On: 10/20/2023 10:16    EKG:   Independently reviewed.  Orders placed or performed during the hospital encounter of 10/20/23   ED EKG   ED EKG   EKG 12-Lead   EKG 12-Lead   EKG 12-Lead   ---------------------------------------------------------------------------------------------------------------------------------------    Assessment / Plan:   Principal Problem:   Acute respiratory failure (HCC) Active Problems:   Pleural effusion on left   HTN (hypertension)   Bronchogenic cancer of right lung (HCC)   Acute heart failure with preserved ejection fraction (HFpEF) (HCC)   Hypokalemia   Cigarette smoker   GI hemorrhage at cecal ulcer s/p colectomy/ileostomy 01/17/2021   COPD (chronic obstructive pulmonary disease) (HCC)   DNR (do not resuscitate)   Mixed hyperlipidemia   Hyponatremia   Closed left hip fracture (HCC)   Microcytic anemia   Assessment and Plan: * Acute respiratory failure (HCC) Multifactorial including heart failure HFpEF, increase left pleural effusion Underlying COPD -with history of underlying lung Kecia -Continue with IV Lasix -Bronchodilators scheduled and as needed -IV steroids with quick taper -Pursuing with possible US thoracentesis  Pleural effusion on left -  Monitor, try to obtain ultrasound-guided thoracentesis -Continue Lasix  Hypokalemia - Monitoring her bleeding  Acute heart failure with preserved ejection fraction (HFpEF) (HCC) - Daily weight, I's and O's, BNP and Reds Clip  -Continue IV Lasix 40 mg twice a day -Last echo reviewed 09/28/2023- EJF: 65-70% mild LVH -Continue home medications -Elevated BNP -Low-sodium diet  Bronchogenic cancer of right lung (HCC) Currently stable, follow-up with Dr. Kirtland Bouchard as an outpatient, status post radiation therapy  HTN (hypertension) - Labile, monitoring, Continue home meds  DNR (do not resuscitate) Confirmed CODE STATUS  COPD (chronic obstructive pulmonary disease) (HCC) - Monitoring closely, DuoNeb bronchodilator,  -IV steroid with quick taper  GI hemorrhage at cecal ulcer s/p colectomy/ileostomy 01/17/2021 - Functional, monitoring  Cigarette smoker -Advised to quit smoking -NicoDerm patch provided  Microcytic anemia - Monitoring closely -Continue iron supplements  Closed left hip fracture (HCC) - Recent history of left hip arthroplasty 08/31/2023 by Dr. Dallas Schimke -Remained stable  Hyponatremia - Monitoring likely secondary to pleural effusion, SIADH, lung malignancy, -Close to baseline, following BMP  Mixed hyperlipidemia Continue statins               Consults called:  None  -------------------------------------------------------------------------------------------------------------------------------------------- DVT prophylaxis:  heparin injection 5,000 Units Start: 10/20/23 1400 TED hose Start: 10/20/23 1150 SCDs Start: 10/20/23 1150   Code Status:   Code Status: Full Code   Admission status: Patient will be admitted as Inpatient, with a greater than 2 midnight length of stay. Level of care: Stepdown   Family Communication:  none at bedside  (The above findings and plan of care has been discussed with  patient in detail, the patient expressed understanding  and agreement of above plan)  --------------------------------------------------------------------------------------------------------------------------------------------------  Disposition Plan:  Anticipated 1-2 days Status is: Inpatient Remains inpatient appropriate because: Respiratory support, thoracentesis, breathing treatment, Lasix     ----------------------------------------------------------------------------------------------------------------------------------------------------  Time spent:  70  Min.  Was spent seeing and evaluating the patient, reviewing all medical records, drawn plan of care.  SIGNED: Kendell Bane, MD, FHM. FAAFP. [place] [facility], Pager  (Please use [email or web address] to page/ or secure chat through epic) If 7PM-7AM, please contact night-coverage [email or web address],  10/20/2023, 1:30 PM

## 2023-10-20 NOTE — Assessment & Plan Note (Signed)
-   Labile, monitoring, Continue home meds

## 2023-10-20 NOTE — Assessment & Plan Note (Signed)
-   Monitor, try to obtain ultrasound-guided thoracentesis -Continue Lasix

## 2023-10-21 ENCOUNTER — Inpatient Hospital Stay (HOSPITAL_COMMUNITY): Payer: Medicare Other

## 2023-10-21 ENCOUNTER — Encounter (HOSPITAL_COMMUNITY): Payer: Self-pay | Admitting: Family Medicine

## 2023-10-21 DIAGNOSIS — J439 Emphysema, unspecified: Secondary | ICD-10-CM | POA: Diagnosis not present

## 2023-10-21 DIAGNOSIS — J90 Pleural effusion, not elsewhere classified: Secondary | ICD-10-CM | POA: Diagnosis not present

## 2023-10-21 DIAGNOSIS — J9601 Acute respiratory failure with hypoxia: Secondary | ICD-10-CM | POA: Diagnosis not present

## 2023-10-21 DIAGNOSIS — I700 Atherosclerosis of aorta: Secondary | ICD-10-CM | POA: Diagnosis not present

## 2023-10-21 LAB — BODY FLUID CELL COUNT WITH DIFFERENTIAL
Eos, Fluid: 0 %
Lymphs, Fluid: 36 %
Monocyte-Macrophage-Serous Fluid: 28 % — ABNORMAL LOW (ref 50–90)
Neutrophil Count, Fluid: 36 % — ABNORMAL HIGH (ref 0–25)
Total Nucleated Cell Count, Fluid: 424 uL (ref 0–1000)

## 2023-10-21 LAB — PROTIME-INR
INR: 1 (ref 0.8–1.2)
Prothrombin Time: 13.6 s (ref 11.4–15.2)

## 2023-10-21 LAB — PROTEIN, PLEURAL OR PERITONEAL FLUID: Total protein, fluid: <3 g/dL

## 2023-10-21 LAB — GRAM STAIN

## 2023-10-21 LAB — CBC
HCT: 36.1 % (ref 36.0–46.0)
Hemoglobin: 11.7 g/dL — ABNORMAL LOW (ref 12.0–15.0)
MCH: 30.3 pg (ref 26.0–34.0)
MCHC: 32.4 g/dL (ref 30.0–36.0)
MCV: 93.5 fL (ref 80.0–100.0)
Platelets: 499 10*3/uL — ABNORMAL HIGH (ref 150–400)
RBC: 3.86 MIL/uL — ABNORMAL LOW (ref 3.87–5.11)
RDW: 15.3 % (ref 11.5–15.5)
WBC: 6.8 10*3/uL (ref 4.0–10.5)
nRBC: 0 % (ref 0.0–0.2)

## 2023-10-21 LAB — GLUCOSE, CAPILLARY: Glucose-Capillary: 123 mg/dL — ABNORMAL HIGH (ref 70–99)

## 2023-10-21 LAB — BASIC METABOLIC PANEL
Anion gap: 12 (ref 5–15)
BUN: 19 mg/dL (ref 8–23)
CO2: 28 mmol/L (ref 22–32)
Calcium: 9 mg/dL (ref 8.9–10.3)
Chloride: 98 mmol/L (ref 98–111)
Creatinine, Ser: 0.86 mg/dL (ref 0.44–1.00)
GFR, Estimated: >60 mL/min (ref >60)
Glucose, Bld: 157 mg/dL — ABNORMAL HIGH (ref 70–99)
Potassium: 4.4 mmol/L (ref 3.5–5.1)
Sodium: 138 mmol/L (ref 135–145)

## 2023-10-21 LAB — LACTATE DEHYDROGENASE, PLEURAL OR PERITONEAL FLUID: LD, Fluid: 53 U/L — ABNORMAL HIGH (ref 3–23)

## 2023-10-21 LAB — APTT: aPTT: 34 s (ref 24–36)

## 2023-10-21 LAB — BRAIN NATRIURETIC PEPTIDE: B Natriuretic Peptide: 601 pg/mL — ABNORMAL HIGH (ref 0.0–100.0)

## 2023-10-21 MED ORDER — FUROSEMIDE 20 MG PO TABS: 40.0000 mg | ORAL_TABLET | Freq: Every day | ORAL | 1 refills | Status: DC

## 2023-10-21 MED ORDER — LIDOCAINE HCL (PF) 2 % IJ SOLN
INTRAMUSCULAR | Status: AC
  Filled 2023-10-21: qty 10

## 2023-10-21 NOTE — Care Management Obs Status (Signed)
MEDICARE OBSERVATION STATUS NOTIFICATION   Patient Details  Name: Kathleen Pope MRN: [id number] Date of Birth: 07/13/1933   Medicare Observation Status Notification Given:  Yes    Elliot Gault, LCSW 10/21/2023, 10:23 AM

## 2023-10-21 NOTE — Progress Notes (Signed)
OT Cancellation Note  Patient Details Name: Kathleen Pope MRN: [id number] DOB: November 12, 1933   Cancelled Treatment:    Reason Eval/Treat Not Completed: OT screened, no needs identified, will sign off. Pt discharging home with resumption of HH services and seeking outpatient palliative care. Pt has HH aide and family/friends who assist as needed. No further OT services required at this time.    Ezra Sites, OTR/L  [phone] 10/21/2023, 10:28 AM

## 2023-10-21 NOTE — Progress Notes (Signed)
Nsg Discharge Note  Admit Date:  10/20/2023 Discharge date: 10/21/2023   NAVEY SLEDZ to be D/C'd Home per MD order.  AVS completed.  Patient/caregiver able to verbalize understanding.  Discharge Medication: Allergies as of 10/21/2023   No Known Allergies      Medication List     TAKE these medications    acetaminophen 500 MG tablet Commonly known as: TYLENOL Take 2 tablets (1,000 mg total) by mouth every 6 (six) hours as needed for moderate pain or mild pain.   ECHINACEA PO Take 167 mg by mouth daily.   Ferrous Sulfate 27 MG Tabs Take 54 mg by mouth daily with breakfast.   Fish Oil 1000 MG Caps Take 1,000 Units by mouth daily.   fluticasone-salmeterol 100-50 MCG/ACT Aepb Commonly known as: ADVAIR Inhale 1 puff into the lungs 2 (two) times daily.   furosemide 20 MG tablet Commonly known as: Lasix Take 2 tablets (40 mg total) by mouth daily. What changed: how much to take   metoprolol tartrate 25 MG tablet Commonly known as: LOPRESSOR Take 0.5 tablets (12.5 mg total) by mouth 2 (two) times daily.   multivitamin with minerals tablet Take 1 tablet by mouth daily. Centrum Silver   nicotine 14 mg/24hr patch Commonly known as: NICODERM CQ - dosed in mg/24 hours Place 1 patch (14 mg total) onto the skin daily.   oxyCODONE 5 MG immediate release tablet Commonly known as: Oxy IR/ROXICODONE Take 0.5 tablets (2.5 mg total) by mouth every 6 (six) hours as needed for moderate pain (pain score 4-6).   Potassium 99 MG Tabs Take 1 tablet (99 mg total) by mouth daily at 2 PM.   pravastatin 10 MG tablet Commonly known as: PRAVACHOL Take 1 tablet (10 mg total) by mouth daily.        Discharge Assessment: Vitals:   10/21/23 0900 10/21/23 0910  BP: 124/69 120/60  Pulse: 96 (!) 104  Resp: 20 18  Temp:    SpO2: 99% 99%   Skin clean, dry and intact without evidence of skin break down, no evidence of skin tears noted. IV catheter discontinued intact. Site without signs  and symptoms of complications - no redness or edema noted at insertion site, patient denies c/o pain - only slight tenderness at site.  Dressing with slight pressure applied.  D/c Instructions-Education: Discharge instructions given to patient/family with verbalized understanding. D/c education completed with patient/family including follow up instructions, medication list, d/c activities limitations if indicated, with other d/c instructions as indicated by MD - patient able to verbalize understanding, all questions fully answered. Patient instructed to return to ED, call 911, or call MD for any changes in condition.  Patient escorted via WC, and D/C home via private auto.  Laurena Spies, RN 10/21/2023 10:12 AM

## 2023-10-21 NOTE — Care Management CC44 (Signed)
Condition Code 44 Documentation Completed  Patient Details  Name: Kathleen Pope MRN: [id number] Date of Birth: January 17, 1933   Condition Code 44 given:  Yes Patient signature on Condition Code 44 notice:  Yes Documentation of 2 MD's agreement:  Yes Code 44 added to claim:  Yes    Elliot Gault, LCSW 10/21/2023, 10:23 AM

## 2023-10-21 NOTE — Discharge Summary (Signed)
Physician Discharge Summary   Patient: Kathleen Pope MRN: [id number] DOB: 1933/10/02  Admit date:     10/20/2023  Discharge date: 10/21/23  Discharge Physician: Kendell Bane   PCP: Elfredia Nevins, MD   Recommendations at discharge:   Follow-up with PCP and oncologist in 1-2 weeks Increased oral Lasix from 20 to 40 mg daily S/p thoracentesis yielding 1.1 L from the left pleural effusion  Discharge Diagnoses: Principal Problem:   Acute respiratory failure (HCC) Active Problems:   Pleural effusion on left   HTN (hypertension)   Bronchogenic cancer of right lung (HCC)   Acute heart failure with preserved ejection fraction (HFpEF) (HCC)   Hypokalemia   Cigarette smoker   GI hemorrhage at cecal ulcer s/p colectomy/ileostomy 01/17/2021   COPD (chronic obstructive pulmonary disease) (HCC)   DNR (do not resuscitate)   Mixed hyperlipidemia   Hyponatremia   Closed left hip fracture (HCC)   Microcytic anemia   SOB (shortness of breath)  Resolved Problems:   * No resolved hospital problems. *  Hospital Course: Kathleen Pope is a 89 year old female with chronic history of RT lung CA under treatment s/p radiation therapy, COPD, not O2 dependent, longtime smoker, HFp EF, HTN, HLD, ileostomy, s/p left hip arthroplasty 10/16.... Presented once again to the ED with chief complaint of shortness of breath lower extremity edema. Patient symptoms progressed to getting worse since yesterday.  Associated with cough but clear sputum.  Progressively worsening lower extremity edema and ankle area, did not take her Lasix this morning, states she is compliant with her Lasix.  Denies any fever chills intermittent chest pain with cough at night. Not O2 dependent at home   ED course: Blood pressure (!) 142/127, pulse (!) 102, temperature 98.7 F (37.1 C), RR (!) 24, height 5\' 1"  (1.549 m), weight 42 kg, SpO2 94%.  Sodium 133, potassium 3.4, chloride 96, glucose 119, alk phos 140, albumin 3.4, BNP  756>> 807 Troponin 14 WBC 10.1 hemoglobin 11.0, Respiratory panel, influenza A, B, SARS-CoV-2 negative  Chest x-ray: IMPRESSION: 1. Increased bilateral interstitial opacities, likely pulmonary edema. 2. Similar to slightly increased small right and increased moderate left pleural effusions  Course of patient to admitted to acute respiratory failure likely due to pulmonary edema, increased left pleural effusion   * Acute respiratory failure (HCC) Multifactorial including heart failure HFpEF, increase left pleural effusion -Much improved  Underlying COPD -with history of underlying lung cancer -Continue with IV Lasix >> switch to p.o., home dose Lasix increased from 20 to 40 mg daily -Bronchodilators scheduled and as needed -IV steroids x 2 10/21/2023-US thoracentesis -yielding 1.1 L of clear fluid  Pleural effusion on left 10/21/2023 ultrasound-guided thoracentesis -using 1.1 L -Continue Lasix -switch to p.o.  Hypokalemia Repleted  Acute heart failure with preserved ejection fraction (HFpEF) (HCC) - Daily weight, I's and O's, BNP and Reds Clip  -Continue IV Lasix 40 mg twice a day>> switch to p.o. 40 mg daily -Last echo reviewed 09/28/2023- EJF: 65-70% mild LVH -Continue home medications -Elevated BNP -Low-sodium diet  Bronchogenic cancer of right lung (HCC) Currently stable, follow-up with Dr. Kirtland Bouchard as an outpatient, status post radiation therapy  HTN (hypertension) - Labile, monitoring, Continue home meds  DNR (do not resuscitate) Confirmed CODE STATUS  COPD (chronic obstructive pulmonary disease) (HCC) - Monitoring closely, DuoNeb bronchodilator,  -IV steroids x 2--much improved shortness of breath and wheezing  GI hemorrhage at cecal ulcer s/p colectomy/ileostomy 01/17/2021 - Functional, monitoring  Cigarette smoker -Advised to  quit smoking -NicoDerm patch provided  Microcytic anemia - Monitoring closely -Continue iron supplements  Closed left hip fracture  (HCC) - Recent history of left hip arthroplasty 08/31/2023 by Dr. Dallas Schimke -Remained stable  Hyponatremia - Monitoring likely secondary to pleural effusion, SIADH, lung malignancy, -Close to baseline,   Mixed hyperlipidemia Continue statins          Consultants: IR Procedures performed: Thoracentesis Disposition: Home Diet recommendation:  Discharge Diet Orders (From admission, onward)     Start     Ordered   10/21/23 0000  Diet - low sodium heart healthy        10/21/23 1004           Regular diet DISCHARGE MEDICATION: Allergies as of 10/21/2023   No Known Allergies      Medication List     TAKE these medications    acetaminophen 500 MG tablet Commonly known as: TYLENOL Take 2 tablets (1,000 mg total) by mouth every 6 (six) hours as needed for moderate pain or mild pain.   ECHINACEA PO Take 167 mg by mouth daily.   Ferrous Sulfate 27 MG Tabs Take 54 mg by mouth daily with breakfast.   Fish Oil 1000 MG Caps Take 1,000 Units by mouth daily.   fluticasone-salmeterol 100-50 MCG/ACT Aepb Commonly known as: ADVAIR Inhale 1 puff into the lungs 2 (two) times daily.   furosemide 20 MG tablet Commonly known as: Lasix Take 2 tablets (40 mg total) by mouth daily. What changed: how much to take   metoprolol tartrate 25 MG tablet Commonly known as: LOPRESSOR Take 0.5 tablets (12.5 mg total) by mouth 2 (two) times daily.   multivitamin with minerals tablet Take 1 tablet by mouth daily. Centrum Silver   nicotine 14 mg/24hr patch Commonly known as: NICODERM CQ - dosed in mg/24 hours Place 1 patch (14 mg total) onto the skin daily.   oxyCODONE 5 MG immediate release tablet Commonly known as: Oxy IR/ROXICODONE Take 0.5 tablets (2.5 mg total) by mouth every 6 (six) hours as needed for moderate pain (pain score 4-6).   Potassium 99 MG Tabs Take 1 tablet (99 mg total) by mouth daily at 2 PM.   pravastatin 10 MG tablet Commonly known as: PRAVACHOL Take 1  tablet (10 mg total) by mouth daily.        Discharge Exam: Filed Weights   10/20/23 1610 10/20/23 1819 10/21/23 0500  Weight: 42 kg 39.6 kg 39.2 kg        General:  AAO x 3,  cooperative, no distress;   HEENT:  Normocephalic, PERRL, otherwise with in Normal limits   Neuro:  CNII-XII intact. , normal motor and sensation, reflexes intact   Lungs:   Clear to auscultation BL, Respirations unlabored,  No wheezes / crackles  Cardio:    S1/S2, RRR, No murmure, No Rubs or Gallops   Abdomen:  Soft, non-tender, bowel sounds active all four quadrants, no guarding or peritoneal signs.  Muscular  skeletal:  Limited exam -global generalized weaknesses - in bed, able to move all 4 extremities,   2+ pulses,  symmetric, No pitting edema  Skin:  Dry, warm to touch, negative for any Rashes,  Wounds: Please see nursing documentation  Pressure Injury 07/18/23 Sacrum Posterior Stage 1 -  Intact skin with non-blanchable redness of a localized area usually over a bony prominence. sm pea size area denuded on sacrum (Active)  07/18/23 1624  Location: Sacrum  Location Orientation: Posterior  Staging: Stage 1 -  Intact skin with non-blanchable redness of a localized area usually over a bony prominence.  Wound Description (Comments): sm pea size area denuded on sacrum  Present on Admission: Yes     Pressure Injury 09/29/23 Coccyx Medial;Mid Stage 2 -  Partial thickness loss of dermis presenting as a shallow open injury with a red, pink wound bed without slough. small round (Active)  09/29/23 1100  Location: Coccyx  Location Orientation: Medial;Mid  Staging: Stage 2 -  Partial thickness loss of dermis presenting as a shallow open injury with a red, pink wound bed without slough.  Wound Description (Comments): small round  Present on Admission: No  Dressing Type Foam - Lift dressing to assess site every shift 09/29/23 1100          Condition at discharge: fair  The results of significant  diagnostics from this hospitalization (including imaging, microbiology, ancillary and laboratory) are listed below for reference.   Imaging Studies: DG Chest Portable 1 View  Result Date: 10/20/2023 CLINICAL DATA:  Shortness of breath and hypoxia EXAM: PORTABLE CHEST 1 VIEW COMPARISON:  Chest radiograph dated 09/28/2023 FINDINGS: Low lung volumes with bronchovascular crowding. Increased bilateral interstitial opacities. Similar right mid lung opacity containing metallic clip. Similar to slightly increased small right and increased moderate left pleural effusions. No pneumothorax. Left heart border is obscured. No acute osseous abnormality. IMPRESSION: 1. Increased bilateral interstitial opacities, likely pulmonary edema. 2. Similar to slightly increased small right and increased moderate left pleural effusions. Electronically Signed   By: Agustin Cree M.D.   On: 10/20/2023 10:16   DG HIP UNILAT WITH PELVIS 2-3 VIEWS LEFT  Result Date: 10/13/2023 AP pelvis and left hip x-rays were obtained in clinic today.  These are compared to prior x-rays.  Well-positioned left hip hemiarthroplasty.  No acute fractures.  No subsidence.  No periprosthetic lucency.  Hip is reduced.  Single view of the right hip demonstrates no obvious injury.   Impression: Stable left hip hemiarthroplasty without subsidence   ECHOCARDIOGRAM COMPLETE  Result Date: 09/28/2023    ECHOCARDIOGRAM REPORT   Patient Name:   TEKIRA TABACCO Date of Exam: 09/28/2023 Medical Rec #:  [id number]    Height:       61.0 in Accession #:    [id number]   Weight:       93.7 lb Date of Birth:  [birth year]     BSA:          1.368 m Patient Age:    89 years     BP:           117/55 mmHg Patient Gender: F            HR:           79 bpm. [facility] Procedure: 2D Echo, Cardiac Doppler and Color Doppler Indications:    CHF- Acute Diastolic I50.31  History:        Patient has prior history of Echocardiogram examinations, most                 recent  05/12/2022. CHF, COPD; Risk Factors:Hypertension,                 Dyslipidemia and Current Smoker.  Sonographer:    Lucendia Herrlich RCS Referring Phys: [id number] Cleora Fleet  Sonographer Comments: Image acquisition challenging due to uncooperative patient. IMPRESSIONS  1. Left ventricular ejection fraction, by estimation, is 65 to 70%. The left ventricle has normal function. The left ventricle  has no regional wall motion abnormalities. There is mild asymmetric left ventricular hypertrophy of the basal segment. Left ventricular diastolic parameters are indeterminate.  2. Right ventricular systolic function is normal. The right ventricular size is normal. There is moderately elevated pulmonary artery systolic pressure. The estimated right ventricular systolic pressure is 51.2 mmHg.  3. Left atrial size was moderately dilated.  4. Possible small left pleural effusion noted.  5. The mitral valve is degenerative. Mild to moderate mitral valve regurgitation. Mild mitral stenosis. The mean mitral valve gradient is 6.0 mmHg.  6. The aortic valve is abnormal, possibly functionally bicuspid and severely calcified. Aortic valve regurgitation is mild. Moderate to severe aortic valve stenosis, upper end of scale. Aortic valve mean gradient measures 36.5 mmHg. Aortic valve Vmax measures 4.07 m/s.  7. The inferior vena cava is dilated in size with <50% respiratory variability, suggesting right atrial pressure of 15 mmHg. Comparison(s): Prior images reviewed side by side. LVEF remains vigorous at 65-70%. Moderate to severe aortic stenosis (upper end of scale0 with mean AV gradient 36.5 mmHg). Mild mitral stenosis with mild to moderate mitral regurgitation. FINDINGS  Left Ventricle: Left ventricular ejection fraction, by estimation, is 65 to 70%. The left ventricle has normal function. The left ventricle has no regional wall motion abnormalities. The left ventricular internal cavity size was normal in size. There is  mild  asymmetric left ventricular hypertrophy of the basal segment. Left ventricular diastolic function could not be evaluated due to mitral annular calcification (moderate or greater). Left ventricular diastolic parameters are indeterminate. Right Ventricle: The right ventricular size is normal. No increase in right ventricular wall thickness. Right ventricular systolic function is normal. There is moderately elevated pulmonary artery systolic pressure. The tricuspid regurgitant velocity is 3.01 m/s, and with an assumed right atrial pressure of 15 mmHg, the estimated right ventricular systolic pressure is 51.2 mmHg. Left Atrium: Left atrial size was moderately dilated. Right Atrium: Right atrial size was normal in size. Pericardium: Possible small left pleural effusion noted. There is no evidence of pericardial effusion. Mitral Valve: The mitral valve is degenerative in appearance. There is mild thickening of the mitral valve leaflet(s). Mild to moderate mitral annular calcification. Mild to moderate mitral valve regurgitation, with posteriorly-directed jet. Mild mitral valve stenosis. MV peak gradient, 18.3 mmHg. The mean mitral valve gradient is 6.0 mmHg. Tricuspid Valve: The tricuspid valve is grossly normal. Tricuspid valve regurgitation is mild. Aortic Valve: The aortic valve is abnormal. There is severe calcifcation of the aortic valve. Aortic valve regurgitation is mild. Aortic regurgitation PHT measures 214 msec. Moderate to severe aortic stenosis is present. Aortic valve mean gradient measures 36.5 mmHg. Aortic valve peak gradient measures 66.3 mmHg. Pulmonic Valve: The pulmonic valve was grossly normal. Pulmonic valve regurgitation is trivial. Aorta: The aortic root is normal in size and structure. Venous: The inferior vena cava is dilated in size with less than 50% respiratory variability, suggesting right atrial pressure of 15 mmHg. IAS/Shunts: No atrial level shunt detected by color flow Doppler.  LEFT  VENTRICLE PLAX 2D LVIDd:         3.60 cm   Diastology LVIDs:         2.30 cm   LV e' medial:    3.81 cm/s LV PW:         1.00 cm   LV E/e' medial:  34.1 LV IVS:        1.20 cm   LV e' lateral:   4.13 cm/s LVOT diam:  1.80 cm   LV E/e' lateral: 31.5 LVOT Area:     2.54 cm  RIGHT VENTRICLE             IVC RV S prime:     10.60 cm/s  IVC diam: 2.40 cm TAPSE (M-mode): 2.0 cm LEFT ATRIUM             Index        RIGHT ATRIUM          Index LA diam:        3.10 cm 2.27 cm/m   RA Area:     9.70 cm LA Vol (A2C):   31.5 ml 23.02 ml/m  RA Volume:   18.20 ml 13.30 ml/m LA Vol (A4C):   54.5 ml 39.83 ml/m LA Biplane Vol: 42.1 ml 30.77 ml/m  AORTIC VALVE AV Vmax:      407.00 cm/s AV Vmean:     280.500 cm/s AV VTI:       0.804 m AV Peak Grad: 66.3 mmHg AV Mean Grad: 36.5 mmHg AI PHT:       214 msec  AORTA Ao Root diam: 2.90 cm Ao Asc diam:  3.30 cm MITRAL VALVE                TRICUSPID VALVE MV Area (PHT): 3.48 cm     TR Peak grad:   36.2 mmHg MV Peak grad:  18.3 mmHg    TR Vmax:        301.00 cm/s MV Mean grad:  6.0 mmHg MV Vmax:       2.14 m/s     SHUNTS MV Vmean:      112.0 cm/s   Systemic Diam: 1.80 cm MV Decel Time: 218 msec MR Peak grad: 137.8 mmHg MR Mean grad: 91.0 mmHg MR Vmax:      587.00 cm/s MR Vmean:     450.0 cm/s MV E velocity: 130.00 cm/s MV A velocity: 101.00 cm/s MV E/A ratio:  1.29 Nona Dell MD Electronically signed by Nona Dell MD Signature Date/Time: 09/28/2023/5:22:31 PM    Final    DG HIP UNILAT WITH PELVIS 2-3 VIEWS RIGHT  Result Date: 09/28/2023 CLINICAL DATA:  [id number] Groin pain [id number] EXAM: DG HIP (WITH OR WITHOUT PELVIS) 2-3V RIGHT COMPARISON:  Pelvic XR, 09/01/2023 and 11/20/2021. CT hip 08/30/2023. FINDINGS: Postoperative change of LEFT hip arthroplasty, in anatomic alignment. No periprosthetic lucency or fracture. Mild, relative degenerative change otherwise normal appearance of the RIGHT hip without fracture or dislocation. Vascular calcifications. IMPRESSION: 1. No acute  displaced fracture, dislocation or significant degenerative change within the RIGHT hip. 2. LEFT hip arthroplasty in anatomic alignment. No periprosthetic lucency or fracture. Electronically Signed   By: Roanna Banning M.D.   On: 09/28/2023 12:26   US THORACENTESIS ASP PLEURAL SPACE W/IMG GUIDE  Result Date: 09/28/2023 INDICATION: Patient with history of COPD, CHF, right lung cancer s/p radiation admitted with acute CHF exacerbation and found to have new left pleural effusion. Request for diagnostic and therapeutic left thoracentesis. EXAM: ULTRASOUND GUIDED LEFT THORACENTESIS MEDICATIONS: 6 mL 1% lidocaine COMPLICATIONS: None immediate. PROCEDURE: An ultrasound guided thoracentesis was thoroughly discussed with the patient and questions answered. The benefits, risks, alternatives and complications were also discussed. The patient understands and wishes to proceed with the procedure. Written consent was obtained. Ultrasound was performed to localize and mark an adequate pocket of fluid in the left chest. The area was then prepped and draped in the normal sterile fashion. 1% Lidocaine was  used for local anesthesia. Under ultrasound guidance a 6 Fr Safe-T-Centesis catheter was introduced. Thoracentesis was performed. The catheter was removed and a dressing applied. FINDINGS: A total of approximately 850 mL of clear yellow fluid was removed. Samples were sent to the laboratory as requested by the clinical team. IMPRESSION: Successful ultrasound guided diagnostic and therapeutic LEFT thoracentesis yielding 850 mL of pleural fluid. Performed by Lynnette Caffey, PA-C Electronically Signed   By: Roanna Banning M.D.   On: 09/28/2023 12:20   DG Chest 1 View  Result Date: 09/28/2023 CLINICAL DATA:  [id number] Status post thoracentesis [id number] EXAM: PORTABLE CHEST 1 VIEW COMPARISON:  Chest XR, 09/27/2023 and 08/30/2023. IR thoracentesis, earlier same day. CT chest, 06/27/2023. FINDINGS: The cardiac apex is obscured. Aortic  arch atherosclerosis. Emphysematous lung change, best appreciated at the apices. Mildly improved aeration of the LEFT chest post thoracentesis without pneumothorax. Small volume residual LEFT pleural effusion with blunting of the diaphragm, associated retrocardiac and basilar opacity is unchanged. Similar degree of aeration of the RIGHT lung with nodular opacity and surgical clip similar degree of RIGHT basilar pleural thickening. No acute osseous abnormality. IMPRESSION: 1. No pneumothorax or significant residual pleural effusion post LEFT thoracentesis. 2. Similar pulmonary findings with emphysematous lung change, RIGHT mid lung nodular opacity and LEFT basilar consolidation. Electronically Signed   By: Roanna Banning M.D.   On: 09/28/2023 11:20   DG Chest Portable 1 View  Result Date: 09/27/2023 CLINICAL DATA:  Shortness of breath EXAM: PORTABLE CHEST 1 VIEW COMPARISON:  08/30/2023, chest CT 06/27/2023 FINDINGS: Bandlike density in the right mid to lower lung with clips, corresponding to known lung nodule and possible post radiation changes. Small right-sided pleural effusion. Moderate left effusion is new compared to prior. Emphysema. Airspace disease at left base. Vascular congestion and possible mild interstitial edema superimposed on underlying emphysema. No pneumothorax. IMPRESSION: 1. Moderate left and small right pleural effusions, effusion on the left is new. Airspace disease at left base may be due to atelectasis or pneumonia. 2. Vascular congestion and possible mild interstitial edema superimposed on underlying emphysema. 3. Bandlike density in the right mid to lower lung with clips, corresponding to known lung nodule and possible post radiation changes. Electronically Signed   By: Jasmine Pang M.D.   On: 09/27/2023 15:32    Microbiology: Results for orders placed or performed during the hospital encounter of 10/20/23  Resp panel by RT-PCR (RSV, Flu A&B, Covid) Anterior Nasal Swab     Status:  None   Collection Time: 10/20/23  9:36 AM   Specimen: Anterior Nasal Swab  Result Value Ref Range Status   SARS Coronavirus 2 by RT PCR NEGATIVE NEGATIVE Final    Comment: (NOTE) SARS-CoV-2 target nucleic acids are NOT DETECTED.  The SARS-CoV-2 RNA is generally detectable in upper respiratory specimens during the acute phase of infection. The lowest concentration of SARS-CoV-2 viral copies this assay can detect is 138 copies/mL. A negative result does not preclude SARS-Cov-2 infection and should not be used as the sole basis for treatment or other patient management decisions. A negative result may occur with  improper specimen collection/handling, submission of specimen other than nasopharyngeal swab, presence of viral mutation(s) within the areas targeted by this assay, and inadequate number of viral copies(<138 copies/mL). A negative result must be combined with clinical observations, patient history, and epidemiological information. The expected result is Negative.  Fact Sheet for Patients:  BloggerCourse.com  Fact Sheet for Healthcare Providers:  SeriousBroker.it  This test is no  t yet approved or cleared by the [place] and  has been authorized for detection and/or diagnosis of SARS-CoV-2 by FDA under an Emergency Use Authorization (EUA). This EUA will remain  in effect (meaning this test can be used) for the duration of the COVID-19 declaration under Section 564(b)(1) of the Act, 21 U.S.C.section 360bbb-3(b)(1), unless the authorization is terminated  or revoked sooner.       Influenza A by PCR NEGATIVE NEGATIVE Final   Influenza B by PCR NEGATIVE NEGATIVE Final    Comment: (NOTE) The Xpert Xpress SARS-CoV-2/FLU/RSV plus assay is intended as an aid in the diagnosis of influenza from Nasopharyngeal swab specimens and should not be used as a sole basis for treatment. Nasal washings and aspirates are unacceptable  for Xpert Xpress SARS-CoV-2/FLU/RSV testing.  Fact Sheet for Patients: BloggerCourse.com  Fact Sheet for Healthcare Providers: SeriousBroker.it  This test is not yet approved or cleared by the [place] FDA and has been authorized for detection and/or diagnosis of SARS-CoV-2 by FDA under an Emergency Use Authorization (EUA). This EUA will remain in effect (meaning this test can be used) for the duration of the COVID-19 declaration under Section 564(b)(1) of the Act, 21 U.S.C. section 360bbb-3(b)(1), unless the authorization is terminated or revoked.     Resp Syncytial Virus by PCR NEGATIVE NEGATIVE Final    Comment: (NOTE) Fact Sheet for Patients: BloggerCourse.com  Fact Sheet for Healthcare Providers: SeriousBroker.it  This test is not yet approved or cleared by the [place] FDA and has been authorized for detection and/or diagnosis of SARS-CoV-2 by FDA under an Emergency Use Authorization (EUA). This EUA will remain in effect (meaning this test can be used) for the duration of the COVID-19 declaration under Section 564(b)(1) of the Act, 21 U.S.C. section 360bbb-3(b)(1), unless the authorization is terminated or revoked.  Performed at [facility], [address] [place], [place] [address]     Labs: CBC: Recent Labs  Lab 10/20/23 [phone] 10/21/23 0438  WBC 10.1 6.8  NEUTROABS 8.4*  --   HGB 11.0* 11.7*  HCT 34.4* 36.1  MCV 93.5 93.5  PLT 504* 499*   Basic Metabolic Panel: Recent Labs  Lab 10/20/23 0938 10/20/23 1114 10/21/23 0438  NA 133*  --  138  K 3.4*  --  4.4  CL 96*  --  98  CO2 25  --  28  GLUCOSE 119*  --  157*  BUN 16  --  19  CREATININE 0.69  --  0.86  CALCIUM 9.1  --  9.0  MG  --  2.0  --   PHOS  --  3.4  --    Liver Function Tests: Recent Labs  Lab 10/20/23 0938  AST 25  ALT 12  ALKPHOS 140*  BILITOT 0.6  PROT 7.0  ALBUMIN  3.4*   CBG: Recent Labs  Lab 10/21/23 0754  GLUCAP 123*    Discharge time spent: greater than 30 minutes.  Signed: Kendell Bane, MD [facility] 10/21/2023

## 2023-10-21 NOTE — Evaluation (Signed)
Physical Therapy Evaluation Patient Details Name: Kathleen Pope MRN: [id number] DOB: [birth year] Today's Date: 10/21/2023  History of Present Illness  Kathleen Pope is a 89 year old female with chronic history of RT lung CA under treatment s/p radiation therapy, COPD, not O2 dependent, longtime smoker, HFp EF, HTN, HLD, ileostomy, s/p left hip arthroplasty 10/16.... Presented once again to the ED with chief complaint of shortness of breath lower extremity edema.  Patient symptoms progressed to getting worse since yesterday.  Associated with cough but clear sputum.  Progressively worsening lower extremity edema and ankle area, did not take her Lasix this morning, states she is compliant with her Lasix.   Clinical Impression  Patient functioning near baseline for functional mobility and gait demonstrating good return for transferring to/from chair, commode in bathroom and ambulating in room/hallways without loss of balance with SpO2 at 100% while on room air.  Plan:  Patient discharged from physical therapy to care of nursing for ambulation daily as tolerated for length of stay.          If plan is discharge home, recommend the following: Help with stairs or ramp for entrance   Can travel by private vehicle        Equipment Recommendations None recommended by PT  Recommendations for Other Services       Functional Status Assessment Patient has had a recent decline in their functional status and/or demonstrates limited ability to make significant improvements in function in a reasonable and predictable amount of time     Precautions / Restrictions Precautions Precautions: Fall Restrictions Weight Bearing Restrictions: No      Mobility  Bed Mobility Overal bed mobility: Modified Independent                  Transfers Overall transfer level: Modified independent                      Ambulation/Gait Ambulation/Gait assistance: Modified independent (Device/Increase  time) Gait Distance (Feet): 150 Feet Assistive device: Rolling walker (2 wheels) Gait Pattern/deviations: Decreased step length - left, Decreased stance time - right, Decreased stride length Gait velocity: decreased     General Gait Details: good return for ambulating in room, hallways without loss of balance while on room air with SpO2 at 100%  Stairs            Wheelchair Mobility     Tilt Bed    Modified Rankin (Stroke Patients Only)       Balance Overall balance assessment: Needs assistance Sitting-balance support: Feet supported, No upper extremity supported Sitting balance-Leahy Scale: Good Sitting balance - Comments: seated at EOB   Standing balance support: During functional activity, No upper extremity supported Standing balance-Leahy Scale: Fair Standing balance comment: fair/good using RW                             Pertinent Vitals/Pain Pain Assessment Pain Assessment: No/denies pain    Home Living Family/patient expects to be discharged to:: Private residence Living Arrangements: Alone Available Help at Discharge: Family;Available PRN/intermittently Type of Home: House Home Access: Stairs to enter Entrance Stairs-Rails: None Entrance Stairs-Number of Steps: 1   Home Layout: One level Home Equipment: Agricultural consultant (2 wheels);Grab bars - tub/shower;Other (comment);Cane - single point;BSC/3in1      Prior Function Prior Level of Function : Independent/Modified Independent             Mobility Comments: [employer]  ambulator with RW. Leans on furniture within the home. ADLs Comments: Independent  household, assisted for community by family     Extremity/Trunk Assessment   Upper Extremity Assessment Upper Extremity Assessment: Defer to OT evaluation    Lower Extremity Assessment Lower Extremity Assessment: Overall WFL for tasks assessed    Cervical / Trunk Assessment Cervical / Trunk Assessment: Normal  Communication    Communication Communication: No apparent difficulties  Cognition Arousal: Alert Behavior During Therapy: WFL for tasks assessed/performed Overall Cognitive Status: Within Functional Limits for tasks assessed                                          General Comments      Exercises     Assessment/Plan    PT Assessment Patient does not need any further PT services  PT Problem List         PT Treatment Interventions      PT Goals (Current goals can be found in the Care Plan section)  Acute Rehab PT Goals Patient Stated Goal: return home with family to assist PT Goal Formulation: With patient/family Time For Goal Achievement: 10/21/23 Potential to Achieve Goals: Good    Frequency       Co-evaluation               AM-PAC PT "6 Clicks" Mobility  Outcome Measure Help needed turning from your back to your side while in a flat bed without using bedrails?: None Help needed moving from lying on your back to sitting on the side of a flat bed without using bedrails?: None Help needed moving to and from a bed to a chair (including a wheelchair)?: None Help needed standing up from a chair using your arms (e.g., wheelchair or bedside chair)?: None Help needed to walk in hospital room?: None Help needed climbing 3-5 steps with a railing? : A Little 6 Click Score: 23    End of Session   Activity Tolerance: Patient tolerated treatment well Patient left: in chair;with call bell/phone within reach;with family/visitor present Nurse Communication: Mobility status PT Visit Diagnosis: Unsteadiness on feet (R26.81);Other abnormalities of gait and mobility (R26.89);Muscle weakness (generalized) (M62.81)    Time: 7846-9629 PT Time Calculation (min) (ACUTE ONLY): 15 min   Charges:   PT Evaluation $PT Eval Low Complexity: 1 Low PT Treatments $Therapeutic Activity: 8-22 mins PT General Charges $$ ACUTE PT VISIT: 1 Visit         10:49 AM, 10/21/23 Ocie Bob, MPT Physical Therapist with [facility] [phone] office [phone] mobile phone

## 2023-10-21 NOTE — Procedures (Signed)
Interventional Radiology Procedure Note  Procedure:   US guided left thoracentesis.  Complications: None Recommendations:  - Ok to shower tomorrow - Do not submerge for 7 days - Routine care  - CXR pending  Signed,  Yvone Neu. Loreta Ave, DO

## 2023-10-21 NOTE — TOC Initial Note (Signed)
Transition of Care [facility]) - Initial/Assessment Note    Patient Details  Name: Kathleen Pope MRN: [id number] Date of Birth: [birth year]  [facility] CM/SW Contact:    Elliot Gault, LCSW Phone Number: 10/21/2023, 10:35 AM  Clinical Narrative:                  Pt admitted from home. MD states pt stable for dc home today. Palliative APNP indicated to [facility] that pt interested in outpatient palliative referral. Met with pt to review dc planning.   Per pt, she has been active with [facility] for PT/OT/RN. She would like to resume these services at dc. Discussed outpatient palliative and answered pt's questions. She states that at this time, she does not want to proceed with palliative referral.  Pt family member asking to see MD prior to dc. TOC updated MD of this request. Also asked for [facility] resumption orders and notified [facility] of pt's admission and dc today.  No other TOC needs.  Expected Discharge Plan: Home w Home Health Services Barriers to Discharge: Barriers Resolved   Patient Goals and CMS Choice Patient states their goals for this hospitalization and ongoing recovery are:: return home CMS Medicare.gov Compare Post Acute Care list provided to:: Patient Choice offered to / list presented to : Patient      Expected Discharge Plan and Services In-house Referral: Clinical Social Work   Post Acute Care Choice: Resumption of Svcs/PTA Provider Living arrangements for the past 2 months: Single Family Home Expected Discharge Date: 10/21/23                                    Prior Living Arrangements/Services Living arrangements for the past 2 months: Single Family Home Lives with:: Self Patient language and need for interpreter reviewed:: Yes Do you feel safe going back to the place where you live?: Yes      Need for Family Participation in Patient Care: No (Comment) Care giver support system in place?: Yes (comment) Current home services: DME,  Home OT, Home PT, Home RN Criminal Activity/Legal Involvement Pertinent to Current Situation/Hospitalization: No - Comment as needed  Activities of Daily Living   ADL Screening (condition at time of admission) Independently performs ADLs?: Yes (appropriate for developmental age) Does the patient have a NEW difficulty with bathing/dressing/toileting/self-feeding that is expected to last >3 days?: Yes (Initiates electronic notice to provider for possible OT consult) Does the patient have a NEW difficulty with getting in/out of bed, walking, or climbing stairs that is expected to last >3 days?: Yes (Initiates electronic notice to provider for possible PT consult) Does the patient have a NEW difficulty with communication that is expected to last >3 days?: No Is the patient deaf or have difficulty hearing?: No Does the patient have difficulty seeing, even when wearing glasses/contacts?: No Does the patient have difficulty concentrating, remembering, or making decisions?: No  Permission Sought/Granted                  Emotional Assessment Appearance:: Appears younger than stated age Attitude/Demeanor/Rapport: Engaged Affect (typically observed): Pleasant Orientation: : Oriented to Self, Oriented to Place, Oriented to  Time, Oriented to Situation Alcohol / Substance Use: Not Applicable Psych Involvement: No (comment)  Admission diagnosis:  Acute respiratory failure (HCC) [J96.00] SOB (shortness of breath) [R06.02] Acute respiratory failure with hypoxia (HCC) [J96.01] Acute congestive heart failure, unspecified heart failure type (  HCC) [I50.9] Patient Active Problem List   Diagnosis Date Noted   Acute respiratory failure (HCC) 10/20/2023   SOB (shortness of breath) 10/20/2023   Acute heart failure with preserved ejection fraction (HFpEF) (HCC) 09/27/2023   S/p left hip fracture 09/27/2023   Hypokalemia 09/27/2023   Microcytic anemia 09/27/2023   DNR (do not resuscitate) 09/27/2023    Closed left hip fracture (HCC) 08/30/2023   CAP (community acquired pneumonia) 08/30/2023   COPD (chronic obstructive pulmonary disease) (HCC) 08/30/2023   SBO (small bowel obstruction) (HCC) 07/18/2023   Bronchogenic cancer of right lung (HCC) 01/16/2023   Hyponatremia 01/27/2021   Ileostomy in place (HCC) 01/24/2021   High output ileostomy (HCC) 01/24/2021   Protein-calorie malnutrition, severe 01/21/2021   Ischemic colitis with LGIB s/p colectomies & ileostomy 3/3 & 01/17/2021 01/19/2021   Squamous cell carcinoma of skin of right middle finger 12/11/2020   Pleural effusion on left 11/27/2020   GI hemorrhage at cecal ulcer s/p colectomy/ileostomy 01/17/2021 08/18/2012   HTN (hypertension) 08/18/2012   Mixed hyperlipidemia 08/04/2012   Cigarette smoker 08/04/2012   Murmur 08/04/2012   PCP:  Elfredia Nevins, MD Pharmacy:   [facility] [place] [place] - [address] [place] [address] Phone: [phone] Fax: [phone]     Social Determinants of Health (SDOH) Social History: SDOH Screenings   Food Insecurity: No Food Insecurity (10/20/2023)  Housing: Patient Declined (10/20/2023)  Transportation Needs: No Transportation Needs (10/20/2023)  Utilities: Not At Risk (10/20/2023)  Depression (PHQ2-9): Low Risk  (01/03/2023)  Tobacco Use: High Risk (10/21/2023)   SDOH Interventions:     Readmission Risk Interventions    10/21/2023   10:34 AM 09/30/2023    1:36 PM 07/19/2023    3:46 PM  Readmission Risk Prevention Plan  Post Dischage Appt   Complete  Medication Screening   Complete  Transportation Screening Complete Complete Complete  PCP or Specialist Appt within 3-5 Days  Not Complete   HRI or Home Care Consult Complete Complete   Social Work Consult for Recovery Care Planning/Counseling Complete Complete   Palliative Care Screening Complete Not Applicable   Medication Review Oceanographer) Complete Complete

## 2023-10-21 NOTE — Progress Notes (Signed)
Patient tolerated left sided thoracentesis procedure well today and 1.1 liters of clear yellow pleural fluid removed with labs collected and sent for processing. Patient verbalized understanding of post procedure instructions and transported via stretcher to have post chest xray at this time with no acute distress noted.

## 2023-10-22 DIAGNOSIS — E871 Hypo-osmolality and hyponatremia: Secondary | ICD-10-CM | POA: Diagnosis not present

## 2023-10-22 DIAGNOSIS — Z66 Do not resuscitate: Secondary | ICD-10-CM | POA: Diagnosis not present

## 2023-10-22 DIAGNOSIS — I5031 Acute diastolic (congestive) heart failure: Secondary | ICD-10-CM | POA: Diagnosis not present

## 2023-10-22 DIAGNOSIS — E782 Mixed hyperlipidemia: Secondary | ICD-10-CM | POA: Diagnosis not present

## 2023-10-22 DIAGNOSIS — C3491 Malignant neoplasm of unspecified part of right bronchus or lung: Secondary | ICD-10-CM | POA: Diagnosis not present

## 2023-10-22 DIAGNOSIS — D509 Iron deficiency anemia, unspecified: Secondary | ICD-10-CM | POA: Diagnosis not present

## 2023-10-22 DIAGNOSIS — G8929 Other chronic pain: Secondary | ICD-10-CM | POA: Diagnosis not present

## 2023-10-22 DIAGNOSIS — I110 Hypertensive heart disease with heart failure: Secondary | ICD-10-CM | POA: Diagnosis not present

## 2023-10-22 DIAGNOSIS — E43 Unspecified severe protein-calorie malnutrition: Secondary | ICD-10-CM | POA: Diagnosis not present

## 2023-10-22 DIAGNOSIS — F17210 Nicotine dependence, cigarettes, uncomplicated: Secondary | ICD-10-CM | POA: Diagnosis not present

## 2023-10-22 DIAGNOSIS — D630 Anemia in neoplastic disease: Secondary | ICD-10-CM | POA: Diagnosis not present

## 2023-10-22 DIAGNOSIS — Z933 Colostomy status: Secondary | ICD-10-CM | POA: Diagnosis not present

## 2023-10-22 DIAGNOSIS — S7292XD Unspecified fracture of left femur, subsequent encounter for closed fracture with routine healing: Secondary | ICD-10-CM | POA: Diagnosis not present

## 2023-10-22 DIAGNOSIS — J9601 Acute respiratory failure with hypoxia: Secondary | ICD-10-CM | POA: Diagnosis not present

## 2023-10-22 DIAGNOSIS — E876 Hypokalemia: Secondary | ICD-10-CM | POA: Diagnosis not present

## 2023-10-22 DIAGNOSIS — J449 Chronic obstructive pulmonary disease, unspecified: Secondary | ICD-10-CM | POA: Diagnosis not present

## 2023-10-25 DIAGNOSIS — E782 Mixed hyperlipidemia: Secondary | ICD-10-CM | POA: Diagnosis not present

## 2023-10-25 DIAGNOSIS — D509 Iron deficiency anemia, unspecified: Secondary | ICD-10-CM | POA: Diagnosis not present

## 2023-10-25 DIAGNOSIS — I5031 Acute diastolic (congestive) heart failure: Secondary | ICD-10-CM | POA: Diagnosis not present

## 2023-10-25 DIAGNOSIS — S7292XD Unspecified fracture of left femur, subsequent encounter for closed fracture with routine healing: Secondary | ICD-10-CM | POA: Diagnosis not present

## 2023-10-25 DIAGNOSIS — C3491 Malignant neoplasm of unspecified part of right bronchus or lung: Secondary | ICD-10-CM | POA: Diagnosis not present

## 2023-10-25 DIAGNOSIS — F17210 Nicotine dependence, cigarettes, uncomplicated: Secondary | ICD-10-CM | POA: Diagnosis not present

## 2023-10-25 DIAGNOSIS — J9601 Acute respiratory failure with hypoxia: Secondary | ICD-10-CM | POA: Diagnosis not present

## 2023-10-25 DIAGNOSIS — Z933 Colostomy status: Secondary | ICD-10-CM | POA: Diagnosis not present

## 2023-10-25 DIAGNOSIS — G8929 Other chronic pain: Secondary | ICD-10-CM | POA: Diagnosis not present

## 2023-10-25 DIAGNOSIS — I110 Hypertensive heart disease with heart failure: Secondary | ICD-10-CM | POA: Diagnosis not present

## 2023-10-25 DIAGNOSIS — E43 Unspecified severe protein-calorie malnutrition: Secondary | ICD-10-CM | POA: Diagnosis not present

## 2023-10-25 DIAGNOSIS — Z66 Do not resuscitate: Secondary | ICD-10-CM | POA: Diagnosis not present

## 2023-10-25 DIAGNOSIS — E871 Hypo-osmolality and hyponatremia: Secondary | ICD-10-CM | POA: Diagnosis not present

## 2023-10-25 DIAGNOSIS — E876 Hypokalemia: Secondary | ICD-10-CM | POA: Diagnosis not present

## 2023-10-25 DIAGNOSIS — D630 Anemia in neoplastic disease: Secondary | ICD-10-CM | POA: Diagnosis not present

## 2023-10-25 DIAGNOSIS — J449 Chronic obstructive pulmonary disease, unspecified: Secondary | ICD-10-CM | POA: Diagnosis not present

## 2023-10-26 DIAGNOSIS — S7292XD Unspecified fracture of left femur, subsequent encounter for closed fracture with routine healing: Secondary | ICD-10-CM | POA: Diagnosis not present

## 2023-10-26 DIAGNOSIS — G8929 Other chronic pain: Secondary | ICD-10-CM | POA: Diagnosis not present

## 2023-10-26 DIAGNOSIS — Z933 Colostomy status: Secondary | ICD-10-CM | POA: Diagnosis not present

## 2023-10-26 DIAGNOSIS — D630 Anemia in neoplastic disease: Secondary | ICD-10-CM | POA: Diagnosis not present

## 2023-10-26 DIAGNOSIS — J449 Chronic obstructive pulmonary disease, unspecified: Secondary | ICD-10-CM | POA: Diagnosis not present

## 2023-10-26 DIAGNOSIS — D509 Iron deficiency anemia, unspecified: Secondary | ICD-10-CM | POA: Diagnosis not present

## 2023-10-26 DIAGNOSIS — C3491 Malignant neoplasm of unspecified part of right bronchus or lung: Secondary | ICD-10-CM | POA: Diagnosis not present

## 2023-10-26 DIAGNOSIS — E782 Mixed hyperlipidemia: Secondary | ICD-10-CM | POA: Diagnosis not present

## 2023-10-26 DIAGNOSIS — E43 Unspecified severe protein-calorie malnutrition: Secondary | ICD-10-CM | POA: Diagnosis not present

## 2023-10-26 DIAGNOSIS — E876 Hypokalemia: Secondary | ICD-10-CM | POA: Diagnosis not present

## 2023-10-26 DIAGNOSIS — Z66 Do not resuscitate: Secondary | ICD-10-CM | POA: Diagnosis not present

## 2023-10-26 DIAGNOSIS — I110 Hypertensive heart disease with heart failure: Secondary | ICD-10-CM | POA: Diagnosis not present

## 2023-10-26 DIAGNOSIS — J9601 Acute respiratory failure with hypoxia: Secondary | ICD-10-CM | POA: Diagnosis not present

## 2023-10-26 DIAGNOSIS — F17210 Nicotine dependence, cigarettes, uncomplicated: Secondary | ICD-10-CM | POA: Diagnosis not present

## 2023-10-26 DIAGNOSIS — E871 Hypo-osmolality and hyponatremia: Secondary | ICD-10-CM | POA: Diagnosis not present

## 2023-10-26 DIAGNOSIS — I5031 Acute diastolic (congestive) heart failure: Secondary | ICD-10-CM | POA: Diagnosis not present

## 2023-10-26 LAB — CULTURE, BODY FLUID W GRAM STAIN -BOTTLE: Culture: NO GROWTH

## 2023-11-02 DIAGNOSIS — E876 Hypokalemia: Secondary | ICD-10-CM | POA: Diagnosis not present

## 2023-11-02 DIAGNOSIS — F17210 Nicotine dependence, cigarettes, uncomplicated: Secondary | ICD-10-CM | POA: Diagnosis not present

## 2023-11-02 DIAGNOSIS — I110 Hypertensive heart disease with heart failure: Secondary | ICD-10-CM | POA: Diagnosis not present

## 2023-11-02 DIAGNOSIS — Z933 Colostomy status: Secondary | ICD-10-CM | POA: Diagnosis not present

## 2023-11-02 DIAGNOSIS — S7292XD Unspecified fracture of left femur, subsequent encounter for closed fracture with routine healing: Secondary | ICD-10-CM | POA: Diagnosis not present

## 2023-11-02 DIAGNOSIS — D630 Anemia in neoplastic disease: Secondary | ICD-10-CM | POA: Diagnosis not present

## 2023-11-02 DIAGNOSIS — D509 Iron deficiency anemia, unspecified: Secondary | ICD-10-CM | POA: Diagnosis not present

## 2023-11-02 DIAGNOSIS — I5031 Acute diastolic (congestive) heart failure: Secondary | ICD-10-CM | POA: Diagnosis not present

## 2023-11-02 DIAGNOSIS — Z66 Do not resuscitate: Secondary | ICD-10-CM | POA: Diagnosis not present

## 2023-11-02 DIAGNOSIS — J9601 Acute respiratory failure with hypoxia: Secondary | ICD-10-CM | POA: Diagnosis not present

## 2023-11-02 DIAGNOSIS — J449 Chronic obstructive pulmonary disease, unspecified: Secondary | ICD-10-CM | POA: Diagnosis not present

## 2023-11-02 DIAGNOSIS — C3491 Malignant neoplasm of unspecified part of right bronchus or lung: Secondary | ICD-10-CM | POA: Diagnosis not present

## 2023-11-02 DIAGNOSIS — E782 Mixed hyperlipidemia: Secondary | ICD-10-CM | POA: Diagnosis not present

## 2023-11-02 DIAGNOSIS — E871 Hypo-osmolality and hyponatremia: Secondary | ICD-10-CM | POA: Diagnosis not present

## 2023-11-02 DIAGNOSIS — G8929 Other chronic pain: Secondary | ICD-10-CM | POA: Diagnosis not present

## 2023-11-02 DIAGNOSIS — E43 Unspecified severe protein-calorie malnutrition: Secondary | ICD-10-CM | POA: Diagnosis not present

## 2023-11-07 DIAGNOSIS — J439 Emphysema, unspecified: Secondary | ICD-10-CM | POA: Diagnosis not present

## 2023-11-07 DIAGNOSIS — I350 Nonrheumatic aortic (valve) stenosis: Secondary | ICD-10-CM | POA: Diagnosis not present

## 2023-11-07 DIAGNOSIS — Z9229 Personal history of other drug therapy: Secondary | ICD-10-CM | POA: Diagnosis not present

## 2023-11-07 DIAGNOSIS — Z932 Ileostomy status: Secondary | ICD-10-CM | POA: Diagnosis not present

## 2023-11-07 DIAGNOSIS — I10 Essential (primary) hypertension: Secondary | ICD-10-CM | POA: Diagnosis not present

## 2023-11-07 DIAGNOSIS — Z85118 Personal history of other malignant neoplasm of bronchus and lung: Secondary | ICD-10-CM | POA: Diagnosis not present

## 2023-11-07 DIAGNOSIS — J449 Chronic obstructive pulmonary disease, unspecified: Secondary | ICD-10-CM | POA: Diagnosis not present

## 2023-11-07 DIAGNOSIS — Z1331 Encounter for screening for depression: Secondary | ICD-10-CM | POA: Diagnosis not present

## 2023-11-07 DIAGNOSIS — Z0001 Encounter for general adult medical examination with abnormal findings: Secondary | ICD-10-CM | POA: Diagnosis not present

## 2023-11-07 DIAGNOSIS — E782 Mixed hyperlipidemia: Secondary | ICD-10-CM | POA: Diagnosis not present

## 2023-11-07 DIAGNOSIS — Z681 Body mass index (BMI) 19 or less, adult: Secondary | ICD-10-CM | POA: Diagnosis not present

## 2023-11-08 DIAGNOSIS — J9601 Acute respiratory failure with hypoxia: Secondary | ICD-10-CM | POA: Diagnosis not present

## 2023-11-08 DIAGNOSIS — I5031 Acute diastolic (congestive) heart failure: Secondary | ICD-10-CM | POA: Diagnosis not present

## 2023-11-08 DIAGNOSIS — E782 Mixed hyperlipidemia: Secondary | ICD-10-CM | POA: Diagnosis not present

## 2023-11-08 DIAGNOSIS — J449 Chronic obstructive pulmonary disease, unspecified: Secondary | ICD-10-CM | POA: Diagnosis not present

## 2023-11-08 DIAGNOSIS — D509 Iron deficiency anemia, unspecified: Secondary | ICD-10-CM | POA: Diagnosis not present

## 2023-11-08 DIAGNOSIS — Z66 Do not resuscitate: Secondary | ICD-10-CM | POA: Diagnosis not present

## 2023-11-08 DIAGNOSIS — E871 Hypo-osmolality and hyponatremia: Secondary | ICD-10-CM | POA: Diagnosis not present

## 2023-11-08 DIAGNOSIS — E876 Hypokalemia: Secondary | ICD-10-CM | POA: Diagnosis not present

## 2023-11-08 DIAGNOSIS — I110 Hypertensive heart disease with heart failure: Secondary | ICD-10-CM | POA: Diagnosis not present

## 2023-11-08 DIAGNOSIS — E43 Unspecified severe protein-calorie malnutrition: Secondary | ICD-10-CM | POA: Diagnosis not present

## 2023-11-08 DIAGNOSIS — G8929 Other chronic pain: Secondary | ICD-10-CM | POA: Diagnosis not present

## 2023-11-08 DIAGNOSIS — C3491 Malignant neoplasm of unspecified part of right bronchus or lung: Secondary | ICD-10-CM | POA: Diagnosis not present

## 2023-11-08 DIAGNOSIS — D630 Anemia in neoplastic disease: Secondary | ICD-10-CM | POA: Diagnosis not present

## 2023-11-08 DIAGNOSIS — S7292XD Unspecified fracture of left femur, subsequent encounter for closed fracture with routine healing: Secondary | ICD-10-CM | POA: Diagnosis not present

## 2023-11-08 DIAGNOSIS — Z933 Colostomy status: Secondary | ICD-10-CM | POA: Diagnosis not present

## 2023-11-08 DIAGNOSIS — F17210 Nicotine dependence, cigarettes, uncomplicated: Secondary | ICD-10-CM | POA: Diagnosis not present

## 2023-11-23 ENCOUNTER — Ambulatory Visit: Payer: Medicare Other | Admitting: Orthopedic Surgery

## 2023-11-23 ENCOUNTER — Other Ambulatory Visit (INDEPENDENT_AMBULATORY_CARE_PROVIDER_SITE_OTHER): Payer: Self-pay

## 2023-11-23 ENCOUNTER — Encounter: Payer: Self-pay | Admitting: Orthopedic Surgery

## 2023-11-23 DIAGNOSIS — Z96642 Presence of left artificial hip joint: Secondary | ICD-10-CM

## 2023-11-23 DIAGNOSIS — Z96649 Presence of unspecified artificial hip joint: Secondary | ICD-10-CM

## 2023-11-23 NOTE — Progress Notes (Signed)
 Orthopaedic Postop Note  Assessment: Kathleen Pope is a 89 y.o. female s/p Left hip hemiarthroplasty  DOS: 08/31/2023  Plan: Kathleen Pope has done very well.  She is ambulating with a cane.  She is very stable.  Radiographs remain unchanged.  She has no pain.  No issues with her surgical incision.  Her right groin pain has improved.  She is comfortable returning to clinic as needed.  If she has any issues, at any time, I am happy to see her in follow-up.   Follow-up: Return if symptoms worsen or fail to improve. XR at next visit: AP pelvis and Left hip  Subjective:  Chief Complaint  Patient presents with   Hip Injury    Hemiarthoplasty for hip fracture 08/31/23     History of Present Illness: Kathleen Pope is a 89 y.o. female who presents following the above stated procedure.  Surgery was approximately 3 months ago.  She is doing much better.  Health has remained stable.  She has transition to a cane, and is doing well.  No longer taking her pain medications.  No issues with her surgical incision.  Review of Systems: No fevers or chills No numbness or tingling No Chest Pain No shortness of breath   Objective: There were no vitals taken for this visit.  Physical Exam:  Elderly female.  No acute distress.  Stable gait with a cane.  Left hip surgical incision is healed.  No surrounding erythema or drainage.  No tenderness to palpation.  She is able to maintain straight leg raise.  She tolerates gentle range of motion.  No pain with axial loading.  IMAGING: I personally ordered and reviewed the following images:  AP pelvis and left hip x-rays were obtained in clinic today.  These are compared to available x-rays.  Well-positioned left hip hemiarthroplasty.  No change in overall alignment.  No periprosthetic lucency.  Hip is reduced.  No subsidence of the implants.  No acute injuries.  No bony lesions.  Impression: Stable left hip hemiarthroplasty, without change in  alignment  Oneil DELENA Horde, MD 11/23/2023 10:28 AM

## 2023-12-29 ENCOUNTER — Inpatient Hospital Stay: Payer: Medicare Other | Attending: Hematology

## 2023-12-29 ENCOUNTER — Ambulatory Visit (HOSPITAL_COMMUNITY)
Admission: RE | Admit: 2023-12-29 | Discharge: 2023-12-29 | Disposition: A | Discharge status: Home / Self Care | Payer: Medicare Other | Source: Ambulatory Visit | Attending: Hematology | Admitting: Hematology

## 2023-12-29 DIAGNOSIS — Z923 Personal history of irradiation: Secondary | ICD-10-CM | POA: Diagnosis not present

## 2023-12-29 DIAGNOSIS — C3490 Malignant neoplasm of unspecified part of unspecified bronchus or lung: Secondary | ICD-10-CM | POA: Insufficient documentation

## 2023-12-29 DIAGNOSIS — E871 Hypo-osmolality and hyponatremia: Secondary | ICD-10-CM | POA: Diagnosis not present

## 2023-12-29 DIAGNOSIS — R918 Other nonspecific abnormal finding of lung field: Secondary | ICD-10-CM | POA: Insufficient documentation

## 2023-12-29 DIAGNOSIS — C3411 Malignant neoplasm of upper lobe, right bronchus or lung: Secondary | ICD-10-CM | POA: Diagnosis not present

## 2023-12-29 DIAGNOSIS — D3502 Benign neoplasm of left adrenal gland: Secondary | ICD-10-CM | POA: Diagnosis not present

## 2023-12-29 DIAGNOSIS — C342 Malignant neoplasm of middle lobe, bronchus or lung: Secondary | ICD-10-CM | POA: Diagnosis not present

## 2023-12-29 LAB — CBC WITH DIFFERENTIAL/PLATELET
Abs Immature Granulocytes: 0.01 10*3/uL (ref 0.00–0.07)
Basophils Absolute: 0 10*3/uL (ref 0.0–0.1)
Basophils Relative: 0 %
Eosinophils Absolute: 0.2 10*3/uL (ref 0.0–0.5)
Eosinophils Relative: 3 %
HCT: 40.3 % (ref 36.0–46.0)
Hemoglobin: 13.8 g/dL (ref 12.0–15.0)
Immature Granulocytes: 0 %
Lymphocytes Relative: 23 %
Lymphs Abs: 1.5 10*3/uL (ref 0.7–4.0)
MCH: 30.9 pg (ref 26.0–34.0)
MCHC: 34.2 g/dL (ref 30.0–36.0)
MCV: 90.4 fL (ref 80.0–100.0)
Monocytes Absolute: 0.7 10*3/uL (ref 0.1–1.0)
Monocytes Relative: 11 %
Neutro Abs: 4.1 10*3/uL (ref 1.7–7.7)
Neutrophils Relative %: 63 %
Platelets: 280 10*3/uL (ref 150–400)
RBC: 4.46 MIL/uL (ref 3.87–5.11)
RDW: 16.5 % — ABNORMAL HIGH (ref 11.5–15.5)
WBC: 6.6 10*3/uL (ref 4.0–10.5)
nRBC: 0 % (ref 0.0–0.2)

## 2023-12-29 LAB — COMPREHENSIVE METABOLIC PANEL
ALT: 18 U/L (ref 0–44)
AST: 28 U/L (ref 15–41)
Albumin: 3.8 g/dL (ref 3.5–5.0)
Alkaline Phosphatase: 86 U/L (ref 38–126)
Anion gap: 9 (ref 5–15)
BUN: 14 mg/dL (ref 8–23)
CO2: 28 mmol/L (ref 22–32)
Calcium: 9.4 mg/dL (ref 8.9–10.3)
Chloride: 95 mmol/L — ABNORMAL LOW (ref 98–111)
Creatinine, Ser: 0.84 mg/dL (ref 0.44–1.00)
GFR, Estimated: >60 mL/min (ref >60)
Glucose, Bld: 129 mg/dL — ABNORMAL HIGH (ref 70–99)
Potassium: 4.1 mmol/L (ref 3.5–5.1)
Sodium: 132 mmol/L — ABNORMAL LOW (ref 135–145)
Total Bilirubin: 0.9 mg/dL (ref 0.0–1.2)
Total Protein: 7.2 g/dL (ref 6.5–8.1)

## 2023-12-29 MED ORDER — IOHEXOL 300 MG/ML  SOLN
80.0000 mL | Freq: Once | INTRAMUSCULAR | Status: AC | PRN
  Administered 2023-12-29: 80 mL via INTRAVENOUS

## 2024-01-05 ENCOUNTER — Encounter: Payer: Self-pay | Admitting: Hematology

## 2024-01-05 ENCOUNTER — Other Ambulatory Visit: Payer: Self-pay

## 2024-01-05 ENCOUNTER — Inpatient Hospital Stay: Payer: Medicare Other | Admitting: Hematology

## 2024-01-05 DIAGNOSIS — C44622 Squamous cell carcinoma of skin of right upper limb, including shoulder: Secondary | ICD-10-CM | POA: Diagnosis not present

## 2024-01-05 NOTE — Progress Notes (Signed)
Virtual Visit via Telephone Note  I connected with [name] on 01/05/24 at  2:15 PM EST by telephone and verified that I am speaking with the correct person using two identifiers.  Location: Patient: At home Provider: At office   I discussed the limitations, risks, security and privacy concerns of performing an evaluation and management service by telephone and the availability of in person appointments. I also discussed with the patient that there may be a patient responsible charge related to this service. The patient expressed understanding and agreed to proceed.   History of Present Illness: Kathleen Pope is a 89 y.o. female being called to day for follow-up of right lung cancer s/p SBRT completed at end of May 2024. She had CT chest done on 12/29/23 that was stable with no new pulmonary nodules or lesions found. Earle has been admitted to the hospital multiple times since her last visit with me on 06/23/23, most recently on 10/20/23 to 10/21/23 for acute respiratory failure with recurrent left sided pleural effusion, treated with IV lasix, IV steroids, and thoracentesis yielding 1.1 L of clear fluid.    Observations/Objective: She denies any worsening of baseline cough.  No new chest pains reported.  No recent respiratory infections.  Denies fevers or weight loss.  Assessment and Plan:  1.  T2 N0 right lung cancer: - She finished SBRT to the right lung in May 2024. - We reviewed CT chest (12/28/2022): Stable treated lesion in the right middle lung with some changes of radiation fibrosis.  Stable mediastinal and small hilar lymph nodes with no new lung nodules.  Stable left adrenal adenoma. - We reviewed results of labs which showed normal LFTs and creatinine.  Mild hyponatremia and normal CBC. - Recommend follow-up in 6 months with repeat CT scan of chest without contrast and labs.   Follow Up Instructions: RTC 6 months with CT and labs   I discussed the assessment and treatment plan  with the patient. The patient was provided an opportunity to ask questions and all were answered. The patient agreed with the plan and demonstrated an understanding of the instructions.   The patient was advised to call back or seek an in-person evaluation if the symptoms worsen or if the condition fails to improve as anticipated.  I provided 20 minutes of non-face-to-face time during this encounter.   Alben Deeds Teague,acting as a Neurosurgeon for [name], MD.,have documented all relevant documentation on the behalf of [name], MD,as directed by  [name], MD while in the presence of [name], MD.  I, [name] MD, have reviewed the above documentation for accuracy and completeness, and I agree with the above.    [name], MD

## 2024-01-09 ENCOUNTER — Other Ambulatory Visit: Payer: Self-pay | Admitting: *Deleted

## 2024-01-09 DIAGNOSIS — C3490 Malignant neoplasm of unspecified part of unspecified bronchus or lung: Secondary | ICD-10-CM

## 2024-03-08 DIAGNOSIS — I739 Peripheral vascular disease, unspecified: Secondary | ICD-10-CM | POA: Diagnosis not present

## 2024-03-08 DIAGNOSIS — L84 Corns and callosities: Secondary | ICD-10-CM | POA: Diagnosis not present

## 2024-03-08 DIAGNOSIS — L603 Nail dystrophy: Secondary | ICD-10-CM | POA: Diagnosis not present

## 2024-03-08 DIAGNOSIS — M79675 Pain in left toe(s): Secondary | ICD-10-CM | POA: Diagnosis not present

## 2024-03-20 DIAGNOSIS — Z932 Ileostomy status: Secondary | ICD-10-CM | POA: Diagnosis not present

## 2024-05-07 DIAGNOSIS — Z85828 Personal history of other malignant neoplasm of skin: Secondary | ICD-10-CM | POA: Diagnosis not present

## 2024-05-07 DIAGNOSIS — D0472 Carcinoma in situ of skin of left lower limb, including hip: Secondary | ICD-10-CM | POA: Diagnosis not present

## 2024-05-07 DIAGNOSIS — L820 Inflamed seborrheic keratosis: Secondary | ICD-10-CM | POA: Diagnosis not present

## 2024-05-07 DIAGNOSIS — D0471 Carcinoma in situ of skin of right lower limb, including hip: Secondary | ICD-10-CM | POA: Diagnosis not present

## 2024-05-07 DIAGNOSIS — Z08 Encounter for follow-up examination after completed treatment for malignant neoplasm: Secondary | ICD-10-CM | POA: Diagnosis not present

## 2024-05-22 DIAGNOSIS — J449 Chronic obstructive pulmonary disease, unspecified: Secondary | ICD-10-CM | POA: Diagnosis not present

## 2024-05-22 DIAGNOSIS — I10 Essential (primary) hypertension: Secondary | ICD-10-CM | POA: Diagnosis not present

## 2024-05-22 DIAGNOSIS — Z7689 Persons encountering health services in other specified circumstances: Secondary | ICD-10-CM | POA: Diagnosis not present

## 2024-05-22 DIAGNOSIS — E782 Mixed hyperlipidemia: Secondary | ICD-10-CM | POA: Diagnosis not present

## 2024-06-06 DIAGNOSIS — E782 Mixed hyperlipidemia: Secondary | ICD-10-CM | POA: Diagnosis not present

## 2024-06-06 DIAGNOSIS — Z7689 Persons encountering health services in other specified circumstances: Secondary | ICD-10-CM | POA: Diagnosis not present

## 2024-06-06 DIAGNOSIS — I10 Essential (primary) hypertension: Secondary | ICD-10-CM | POA: Diagnosis not present

## 2024-06-12 DIAGNOSIS — Z0001 Encounter for general adult medical examination with abnormal findings: Secondary | ICD-10-CM | POA: Diagnosis not present

## 2024-06-12 DIAGNOSIS — R7303 Prediabetes: Secondary | ICD-10-CM | POA: Diagnosis not present

## 2024-06-12 DIAGNOSIS — N1831 Chronic kidney disease, stage 3a: Secondary | ICD-10-CM | POA: Diagnosis not present

## 2024-06-12 DIAGNOSIS — F17210 Nicotine dependence, cigarettes, uncomplicated: Secondary | ICD-10-CM | POA: Diagnosis not present

## 2024-06-12 DIAGNOSIS — I10 Essential (primary) hypertension: Secondary | ICD-10-CM | POA: Diagnosis not present

## 2024-06-12 DIAGNOSIS — J449 Chronic obstructive pulmonary disease, unspecified: Secondary | ICD-10-CM | POA: Diagnosis not present

## 2024-06-12 DIAGNOSIS — E782 Mixed hyperlipidemia: Secondary | ICD-10-CM | POA: Diagnosis not present

## 2024-07-02 DIAGNOSIS — L570 Actinic keratosis: Secondary | ICD-10-CM | POA: Diagnosis not present

## 2024-07-02 DIAGNOSIS — Z85828 Personal history of other malignant neoplasm of skin: Secondary | ICD-10-CM | POA: Diagnosis not present

## 2024-07-02 DIAGNOSIS — Z08 Encounter for follow-up examination after completed treatment for malignant neoplasm: Secondary | ICD-10-CM | POA: Diagnosis not present

## 2024-07-02 DIAGNOSIS — X32XXXD Exposure to sunlight, subsequent encounter: Secondary | ICD-10-CM | POA: Diagnosis not present

## 2024-07-02 DIAGNOSIS — L820 Inflamed seborrheic keratosis: Secondary | ICD-10-CM | POA: Diagnosis not present

## 2024-07-03 ENCOUNTER — Other Ambulatory Visit: Payer: Self-pay

## 2024-07-03 DIAGNOSIS — C44622 Squamous cell carcinoma of skin of right upper limb, including shoulder: Secondary | ICD-10-CM

## 2024-07-03 DIAGNOSIS — C3490 Malignant neoplasm of unspecified part of unspecified bronchus or lung: Secondary | ICD-10-CM

## 2024-07-04 ENCOUNTER — Ambulatory Visit (HOSPITAL_COMMUNITY)
Admission: RE | Admit: 2024-07-04 | Discharge: 2024-07-04 | Disposition: A | Discharge status: Home / Self Care | Payer: Medicare Other | Source: Ambulatory Visit | Attending: Hematology | Admitting: Hematology

## 2024-07-04 ENCOUNTER — Inpatient Hospital Stay: Payer: Medicare Other | Attending: Oncology

## 2024-07-04 DIAGNOSIS — C3490 Malignant neoplasm of unspecified part of unspecified bronchus or lung: Secondary | ICD-10-CM

## 2024-07-04 DIAGNOSIS — J439 Emphysema, unspecified: Secondary | ICD-10-CM | POA: Diagnosis not present

## 2024-07-04 DIAGNOSIS — F17210 Nicotine dependence, cigarettes, uncomplicated: Secondary | ICD-10-CM | POA: Diagnosis not present

## 2024-07-04 DIAGNOSIS — Z79899 Other long term (current) drug therapy: Secondary | ICD-10-CM | POA: Diagnosis not present

## 2024-07-04 DIAGNOSIS — Z7951 Long term (current) use of inhaled steroids: Secondary | ICD-10-CM | POA: Diagnosis not present

## 2024-07-04 DIAGNOSIS — Z85118 Personal history of other malignant neoplasm of bronchus and lung: Secondary | ICD-10-CM | POA: Insufficient documentation

## 2024-07-04 DIAGNOSIS — J432 Centrilobular emphysema: Secondary | ICD-10-CM | POA: Diagnosis not present

## 2024-07-04 DIAGNOSIS — I700 Atherosclerosis of aorta: Secondary | ICD-10-CM | POA: Diagnosis not present

## 2024-07-04 DIAGNOSIS — C44622 Squamous cell carcinoma of skin of right upper limb, including shoulder: Secondary | ICD-10-CM

## 2024-07-04 LAB — CBC WITH DIFFERENTIAL/PLATELET
Basophils Absolute: 0 K/uL (ref 0.0–0.1)
Basophils Relative: 0 %
Eosinophils Absolute: 0.1 K/uL (ref 0.0–0.5)
Eosinophils Relative: 1 %
HCT: 40 % (ref 36.0–46.0)
Hemoglobin: 13.8 g/dL (ref 12.0–15.0)
Lymphocytes Relative: 26 %
Lymphs Abs: 2 K/uL (ref 0.7–4.0)
MCH: 32.9 pg (ref 26.0–34.0)
MCHC: 34.5 g/dL (ref 30.0–36.0)
MCV: 95.5 fL (ref 80.0–100.0)
Monocytes Absolute: 0.4 K/uL (ref 0.1–1.0)
Monocytes Relative: 5 %
Neutro Abs: 5.1 K/uL (ref 1.7–7.7)
Neutrophils Relative %: 68 %
Platelets: 271 K/uL (ref 150–400)
RBC: 4.19 MIL/uL (ref 3.87–5.11)
RDW: 13.9 % (ref 11.5–15.5)
Smear Review: NORMAL
WBC: 7.5 K/uL (ref 4.0–10.5)
nRBC: 0 % (ref 0.0–0.2)

## 2024-07-04 LAB — COMPREHENSIVE METABOLIC PANEL WITH GFR
ALT: 15 U/L (ref 0–44)
AST: 24 U/L (ref 15–41)
Albumin: 3.7 g/dL (ref 3.5–5.0)
Alkaline Phosphatase: 70 U/L (ref 38–126)
Anion gap: 12 (ref 5–15)
BUN: 20 mg/dL (ref 8–23)
CO2: 28 mmol/L (ref 22–32)
Calcium: 9.6 mg/dL (ref 8.9–10.3)
Chloride: 101 mmol/L (ref 98–111)
Creatinine, Ser: 1.02 mg/dL — ABNORMAL HIGH (ref 0.44–1.00)
GFR, Estimated: 53 mL/min — ABNORMAL LOW (ref >60)
Glucose, Bld: 131 mg/dL — ABNORMAL HIGH (ref 70–99)
Potassium: 3.3 mmol/L — ABNORMAL LOW (ref 3.5–5.1)
Sodium: 141 mmol/L (ref 135–145)
Total Bilirubin: 0.6 mg/dL (ref 0.0–1.2)
Total Protein: 7 g/dL (ref 6.5–8.1)

## 2024-07-10 NOTE — Progress Notes (Signed)
 Patient Care Team: Bertell Satterfield, MD as PCP - General (Internal Medicine) Celestia Joesph SQUIBB, RN as Oncology Nurse Navigator (Medical Oncology)  Clinic Day:  07/11/2024  Referring physician: Bertell Satterfield, MD   CHIEF COMPLAINT:  CC: Non-small cell lung cancer of right lung  Kathleen Pope Kathleen Pope 89 y.o. female was transferred to my care after her prior physician has left.   ASSESSMENT & PLAN:   Assessment & Plan: Kathleen Pope  is a 89 y.o. female with non-small cell lung carcinoma of right lung  Assessment & Plan Malignant neoplasm of unspecified part of unspecified bronchus or lung (HCC) Stage IIa (cT2b, cN0, M0) probable non-small cell lung cancer of right lung Carcinoma very evident on PET scan but biopsies were negative As per tumor board discussion, patient underwent SBRT to the lung lesion Patient currently on surveillance.  - We reviewed the CT scan together.  The lung nodules are slightly bigger than the previous scan and are still less than 1 cm.  Considering the growth is very small approximately 0.3 cm, discussed risk versus benefits of continue to monitor this at this time. - Patient is in agreement with the above plan - Will repeat a CT scan in 3 months for closer monitoring - If there is any increase in the size on the next CT scan, can consider obtaining a PET scan and further discuss evaluation and treatment options with the patient  Return to clinic in 3 months with CT scan.    The patient understands the plans discussed today and is in agreement with them.  She knows to contact our office if she develops concerns prior to her next appointment.  40 minutes of total time was spent for this patient encounter, including preparation, face-to-face counseling with the patient and coordination of care, physical exam, and documentation of the encounter. > 50% of the time was spent on counseling as documented under my assessment and plan.    Mickiel Dry, MD  [facility]  [facility] - A DEPT OF [facility] [address] [place] [address] Dept: [phone] Dept Fax: [phone]   Orders Placed This Encounter  Procedures   CT CHEST W CONTRAST    Standing Status:   Future    Expected Date:   10/11/2024    Expiration Date:   07/11/2025    If indicated for the ordered procedure, I authorize the administration of contrast media per Radiology protocol:   Yes    Does the patient have a contrast media/X-ray dye allergy?:   No    Preferred imaging location?:   [facility]     ONCOLOGY HISTORY:   I have reviewed her chart and materials related to her cancer extensively and collaborated history with the patient. Summary of oncologic history is as follows:   Non-small cell lung cancer of right lung:  -12/28/2022: CT chest without contrast:No acute posttraumatic findings identified in the chest. Spiculated mass anteriorly in the right lung is highly suspicious for bronchogenic carcinoma. This appears to cross the minor fissure and involve both the upper and middle lobes.  -01/06/2023: PET: Hypermetabolic subsolid mass in the right middle lobe, compatible with primary bronchogenic carcinoma (T2b N0 M0, stage II A disease). Hypermetabolic 6 mm right parotid nodule. Malignancy cannot be excluded. -02/14/2023: Pathology : RUL FNA atypical cells.  RLL FNA and brushing with atypical cells.  Lymph node 4L and lymph node 7 FNA with no malignant cells.  -  After TB discussion, was agreed upon to do SBRT -03/31/2023-04/12/2023: SBRT to the right lung -06/27/2023: CT chest with contrast: Improving anterior right lung nodule centered in the right middle lobe. Surrounding radiation changes.  -12/29/2023: CT chest with contrast: Stable irregular treated nodular lesion in the right middle lobe with adjacent fiducials.  There are progressive changes of radiation fibrosis surrounding lesion.  Stable mediastinal and  small hilar lymph nodes.  No new pulmonary nodules or lesions. -07/04/2024: CT chest without contrast: Slight interval enlargement of nodule in the right lower lobe adjacent to a marking clip, measuring 0.9 x 0.9 cm, previously 0.6 cm. Slight interval increase in size and solid character of an additional nodule superiorly and posterior in the superior segment right lower lobe, measuring 0.7 x 0.6 cm, previously 0.6 x 0.5 cm. No significant change in a treated irregular opacity in the anterior right middle lobe with adjacent ground-glass, measuring 3.3 x 1.3 cm.  Current Treatment:  Surveillance  INTERVAL HISTORY:   Kathleen Pope is here today for follow up. Patient is accompanied by her nephew today  She reported no complaints today.  Her appetite is good and has not been losing any weight.  She is still continuing to smoke about half a pack of cigarettes per day with no intention to quit.  She has occasional cough that she thinks is from smoking.  She still lives by herself and nephew lives 5 miles away.   I have reviewed the past medical history, past surgical history, social history and family history with the patient and they are unchanged from previous note.  ALLERGIES:  has no known allergies.  MEDICATIONS:  Current Outpatient Medications  Medication Sig Dispense Refill   acetaminophen  (TYLENOL ) 500 MG tablet Take 2 tablets (1,000 mg total) by mouth every 6 (six) hours as needed for moderate pain or mild pain.     amLODipine  (NORVASC ) 5 MG tablet Take 5 mg by mouth daily.     ECHINACEA PO Take 167 mg by mouth daily.     Ferrous Sulfate  27 MG TABS Take 54 mg by mouth daily with breakfast.     fluticasone -salmeterol (ADVAIR) 100-50 MCG/ACT AEPB Inhale 1 puff into the lungs 2 (two) times daily. 60 each 3   gabapentin  (NEURONTIN ) 300 MG capsule Take by mouth 2 (two) times daily.     losartan (COZAAR) 50 MG tablet Take 50 mg by mouth daily.     metoprolol  tartrate (LOPRESSOR ) 25 MG tablet  Take 0.5 tablets (12.5 mg total) by mouth 2 (two) times daily. 30 tablet 0   Multiple Vitamins-Minerals (MULTIVITAMIN WITH MINERALS) tablet Take 1 tablet by mouth daily. Centrum Silver     nicotine  (NICODERM CQ  - DOSED IN MG/24 HOURS) 14 mg/24hr patch Place 1 patch (14 mg total) onto the skin daily. 28 patch 0   Omega-3 Fatty Acids  (FISH OIL) 1000 MG CAPS Take 1,000 Units by mouth daily.     oxyCODONE  (OXY IR/ROXICODONE ) 5 MG immediate release tablet Take 0.5 tablets (2.5 mg total) by mouth every 6 (six) hours as needed for moderate pain (pain score 4-6). 20 tablet 0   Potassium 99 MG TABS Take 1 tablet (99 mg total) by mouth daily at 2 PM. 30 tablet 0   pravastatin  (PRAVACHOL ) 10 MG tablet Take 1 tablet (10 mg total) by mouth daily. 30 tablet 0   furosemide  (LASIX ) 40 MG tablet Take 40 mg by mouth 2 (two) times daily.     metoprolol  succinate (TOPROL -XL) 25  MG 24 hr tablet Take 1 tablet every day by oral route.     No current facility-administered medications for this visit.    REVIEW OF SYSTEMS:   Constitutional: Denies fevers, chills or abnormal weight loss Eyes: Denies blurriness of vision Ears, nose, mouth, throat, and face: Denies mucositis or sore throat Respiratory: Denies cough, dyspnea or wheezes Cardiovascular: Denies palpitation, chest discomfort or lower extremity swelling Gastrointestinal:  Denies nausea, heartburn or change in bowel habits Skin: Denies abnormal skin rashes Lymphatics: Denies new lymphadenopathy or easy bruising Neurological:Denies numbness, tingling or new weaknesses Behavioral/Psych: Mood is stable, no new changes  All other systems were reviewed with the patient and are negative.   VITALS:  Blood pressure 115/63, pulse 93, temperature 97.7 F (36.5 C), temperature source Oral, resp. rate 18, weight 90 lb 2.7 oz (40.9 kg), SpO2 98%.  Wt Readings from Last 3 Encounters:  07/11/24 90 lb 2.7 oz (40.9 kg)  10/21/23 86 lb 6.7 oz (39.2 kg)  09/30/23 93 lb  7.6 oz (42.4 kg)    Body mass index is 17.04 kg/m.  Performance status (ECOG): 2 - Symptomatic, <50% confined to bed  PHYSICAL EXAM:   GENERAL:alert, no distress and comfortable SKIN: skin color, texture, turgor are normal, no rashes or significant lesions EYES: normal, Conjunctiva are pink and non-injected, sclera clear LYMPH:  no palpable lymphadenopathy in the cervical, axillary or inguinal LUNGS: clear to auscultation and percussion with normal breathing effort HEART: regular rate & rhythm and no murmurs and no lower extremity edema ABDOMEN:abdomen soft, non-tender and normal bowel sounds Musculoskeletal:no cyanosis of digits and no clubbing  NEURO: alert & oriented x 3 with fluent speech, no focal motor/sensory deficits  LABORATORY DATA:  I have reviewed the data as listed   Lab Results  Component Value Date   WBC 7.5 07/04/2024   NEUTROABS 5.1 07/04/2024   HGB 13.8 07/04/2024   HCT 40.0 07/04/2024   MCV 95.5 07/04/2024   PLT 271 07/04/2024      Chemistry      Component Value Date/Time   NA 141 07/04/2024 1456   K 3.3 (L) 07/04/2024 1456   CL 101 07/04/2024 1456   CO2 28 07/04/2024 1456   BUN 20 07/04/2024 1456   CREATININE 1.02 (H) 07/04/2024 1456      Component Value Date/Time   CALCIUM  9.6 07/04/2024 1456   ALKPHOS 70 07/04/2024 1456   AST 24 07/04/2024 1456   ALT 15 07/04/2024 1456   BILITOT 0.6 07/04/2024 1456       RADIOGRAPHIC STUDIES: I have personally reviewed the radiological images as listed and agreed with the findings in the report.  CT Chest Wo Contrast CLINICAL DATA:  Non-small cell lung cancer restaging * Tracking Code: BO *  EXAM: CT CHEST WITHOUT CONTRAST  TECHNIQUE: Multidetector CT imaging of the chest was performed following the standard protocol without IV contrast.  RADIATION DOSE REDUCTION: This exam was performed according to the departmental dose-optimization program which includes automated exposure control,  adjustment of the mA and/or kV according to patient size and/or use of iterative reconstruction technique.  COMPARISON:  12/29/2023  FINDINGS: Cardiovascular: Aortic atherosclerosis. Dense aortic valve calcifications. Normal heart size. Three-vessel coronary artery calcifications. No pericardial effusion.  Mediastinum/Nodes: No enlarged mediastinal, hilar, or axillary lymph nodes. Thyroid  gland, trachea, and esophagus demonstrate no significant findings.  Lungs/Pleura: Moderate centrilobular emphysema. Significant interval enlargement of a nodule in the right lower lobe adjacent to a marking clip, measuring 0.9 x 0.9 cm,  previously 0.6 cm (series 3, image 73). Slight interval increase in size and solid character of an additional nodule superiorly and posterior in the superior segment right lower lobe, measuring 0.7 x 0.6 cm, previously 0.6 x 0.5 cm (series 3, image 67). Other small nodules in this vicinity unchanged. No significant change in additional small nodules in this vicinity. When measured with similar technique, no significant change in an irregular opacity in the anterior right middle lobe with adjacent ground-glass, measuring 3.3 x 1.3 cm (series 3, image 89). No pleural effusion or pneumothorax.  Upper Abdomen: No acute abnormality. Fat containing bilateral adrenal adenomata.  Musculoskeletal: No chest wall abnormality. No acute osseous findings.  IMPRESSION: 1. Significant interval enlargement of a nodule in the right lower lobe adjacent to a marking clip, measuring 0.9 x 0.9 cm, previously 0.6 cm. 2. Slight interval increase in size and solid character of an additional nodule superiorly and posterior in the superior segment right lower lobe, measuring 0.7 x 0.6 cm, previously 0.6 x 0.5 cm. 3. No significant change in a treated irregular opacity in the anterior right middle lobe with adjacent ground-glass, measuring 3.3 x 1.3 cm. 4. Emphysema. 5. Coronary  artery disease. 6. Dense aortic valve calcifications. Correlate for echocardiographic evidence of aortic valve dysfunction.  Aortic Atherosclerosis (ICD10-I70.0) and Emphysema (ICD10-J43.9).  Electronically Signed   By: Marolyn JONETTA Jaksch M.D.   On: 07/10/2024 21:56

## 2024-07-11 ENCOUNTER — Inpatient Hospital Stay (HOSPITAL_BASED_OUTPATIENT_CLINIC_OR_DEPARTMENT_OTHER): Admitting: Oncology

## 2024-07-11 ENCOUNTER — Ambulatory Visit: Payer: Medicare Other | Admitting: Hematology

## 2024-07-11 VITALS — BP 115/63 | HR 93 | Temp 97.7°F | Resp 18 | Wt 90.2 lb

## 2024-07-11 DIAGNOSIS — F17210 Nicotine dependence, cigarettes, uncomplicated: Secondary | ICD-10-CM | POA: Diagnosis not present

## 2024-07-11 DIAGNOSIS — Z7951 Long term (current) use of inhaled steroids: Secondary | ICD-10-CM | POA: Diagnosis not present

## 2024-07-11 DIAGNOSIS — Z79899 Other long term (current) drug therapy: Secondary | ICD-10-CM | POA: Diagnosis not present

## 2024-07-11 DIAGNOSIS — Z85118 Personal history of other malignant neoplasm of bronchus and lung: Secondary | ICD-10-CM | POA: Diagnosis not present

## 2024-07-11 DIAGNOSIS — J439 Emphysema, unspecified: Secondary | ICD-10-CM | POA: Diagnosis not present

## 2024-07-11 DIAGNOSIS — C3490 Malignant neoplasm of unspecified part of unspecified bronchus or lung: Secondary | ICD-10-CM

## 2024-07-11 NOTE — Patient Instructions (Addendum)
 [facility] at [facility] Discharge Instructions   You were seen and examined today by Dr. Davonna.  She reviewed the results of your lab work which are normal/stable.   She reviewed the results of your CT scan. The lung nodule has grown some. We will monitor this at next visit with a scan.   We will see you back in 3 months. We will repeat a scan prior to this visit.    Return as scheduled.    Thank you for choosing [facility] at [facility] to provide your oncology and hematology care.  To afford each patient quality time with our provider, please arrive at least 15 minutes before your scheduled appointment time.   If you have a lab appointment with the Cancer Center please come in thru the Main Entrance and check in at the main information desk.  You need to re-schedule your appointment should you arrive 10 or more minutes late.  We strive to give you quality time with our providers, and arriving late affects you and other patients whose appointments are after yours.  Also, if you no show three or more times for appointments you may be dismissed from the clinic at the providers discretion.     Again, thank you for choosing [facility]  Our hope is that these requests will decrease the amount of time that you wait before being seen by our physicians.       _____________________________________________________________  Should you have questions after your visit to [facility], please contact our office at [phone] and follow the prompts.  Our office hours are 8:00 a.m. and 4:30 p.m. Monday - Friday.  Please note that voicemails left after 4:00 p.m. may not be returned until the following business day.  We are closed weekends and major holidays.  You do have access to a nurse 24-7, just call the main number to the clinic [phone] and do not press any options, hold on the line and a nurse will answer the phone.     For prescription refill requests, have your pharmacy contact our office and allow 72 hours.    Due to Covid, you will need to wear a mask upon entering the hospital. If you do not have a mask, a mask will be given to you at the Main Entrance upon arrival. For doctor visits, patients may have 1 support person age 62 or older with them. For treatment visits, patients can not have anyone with them due to social distancing guidelines and our immunocompromised population.

## 2024-08-16 DIAGNOSIS — M79675 Pain in left toe(s): Secondary | ICD-10-CM | POA: Diagnosis not present

## 2024-08-16 DIAGNOSIS — I739 Peripheral vascular disease, unspecified: Secondary | ICD-10-CM | POA: Diagnosis not present

## 2024-08-16 DIAGNOSIS — L603 Nail dystrophy: Secondary | ICD-10-CM | POA: Diagnosis not present

## 2024-08-16 DIAGNOSIS — L84 Corns and callosities: Secondary | ICD-10-CM | POA: Diagnosis not present

## 2024-09-13 DIAGNOSIS — E782 Mixed hyperlipidemia: Secondary | ICD-10-CM | POA: Diagnosis not present

## 2024-09-13 DIAGNOSIS — R7303 Prediabetes: Secondary | ICD-10-CM | POA: Diagnosis not present

## 2024-09-20 DIAGNOSIS — I10 Essential (primary) hypertension: Secondary | ICD-10-CM | POA: Diagnosis not present

## 2024-09-20 DIAGNOSIS — N1831 Chronic kidney disease, stage 3a: Secondary | ICD-10-CM | POA: Diagnosis not present

## 2024-09-20 DIAGNOSIS — I129 Hypertensive chronic kidney disease with stage 1 through stage 4 chronic kidney disease, or unspecified chronic kidney disease: Secondary | ICD-10-CM | POA: Diagnosis not present

## 2024-09-20 DIAGNOSIS — E782 Mixed hyperlipidemia: Secondary | ICD-10-CM | POA: Diagnosis not present

## 2024-09-20 DIAGNOSIS — J449 Chronic obstructive pulmonary disease, unspecified: Secondary | ICD-10-CM | POA: Diagnosis not present

## 2024-10-16 ENCOUNTER — Other Ambulatory Visit: Payer: Self-pay

## 2024-10-16 DIAGNOSIS — C3490 Malignant neoplasm of unspecified part of unspecified bronchus or lung: Secondary | ICD-10-CM

## 2024-10-17 ENCOUNTER — Encounter (HOSPITAL_COMMUNITY): Payer: Self-pay

## 2024-10-17 ENCOUNTER — Ambulatory Visit (HOSPITAL_COMMUNITY)
Admission: RE | Admit: 2024-10-17 | Discharge: 2024-10-17 | Disposition: A | Source: Ambulatory Visit | Attending: Oncology

## 2024-10-17 ENCOUNTER — Inpatient Hospital Stay: Attending: Oncology

## 2024-10-17 DIAGNOSIS — I700 Atherosclerosis of aorta: Secondary | ICD-10-CM | POA: Diagnosis not present

## 2024-10-17 DIAGNOSIS — C3490 Malignant neoplasm of unspecified part of unspecified bronchus or lung: Secondary | ICD-10-CM | POA: Insufficient documentation

## 2024-10-17 DIAGNOSIS — Z923 Personal history of irradiation: Secondary | ICD-10-CM | POA: Diagnosis not present

## 2024-10-17 DIAGNOSIS — E876 Hypokalemia: Secondary | ICD-10-CM | POA: Diagnosis not present

## 2024-10-17 DIAGNOSIS — Z85118 Personal history of other malignant neoplasm of bronchus and lung: Secondary | ICD-10-CM | POA: Diagnosis present

## 2024-10-17 DIAGNOSIS — J449 Chronic obstructive pulmonary disease, unspecified: Secondary | ICD-10-CM | POA: Insufficient documentation

## 2024-10-17 DIAGNOSIS — R590 Localized enlarged lymph nodes: Secondary | ICD-10-CM | POA: Diagnosis not present

## 2024-10-17 LAB — CBC WITH DIFFERENTIAL/PLATELET
Abs Immature Granulocytes: 0.02 K/uL (ref 0.00–0.07)
Basophils Absolute: 0 K/uL (ref 0.0–0.1)
Basophils Relative: 0 %
Eosinophils Absolute: 0.2 K/uL (ref 0.0–0.5)
Eosinophils Relative: 3 %
HCT: 42 % (ref 36.0–46.0)
Hemoglobin: 14.2 g/dL (ref 12.0–15.0)
Immature Granulocytes: 0 %
Lymphocytes Relative: 25 %
Lymphs Abs: 1.9 K/uL (ref 0.7–4.0)
MCH: 31.6 pg (ref 26.0–34.0)
MCHC: 33.8 g/dL (ref 30.0–36.0)
MCV: 93.3 fL (ref 80.0–100.0)
Monocytes Absolute: 0.7 K/uL (ref 0.1–1.0)
Monocytes Relative: 9 %
Neutro Abs: 4.7 K/uL (ref 1.7–7.7)
Neutrophils Relative %: 63 %
Platelets: 265 K/uL (ref 150–400)
RBC: 4.5 MIL/uL (ref 3.87–5.11)
RDW: 14.6 % (ref 11.5–15.5)
WBC: 7.5 K/uL (ref 4.0–10.5)
nRBC: 0 % (ref 0.0–0.2)

## 2024-10-17 LAB — COMPREHENSIVE METABOLIC PANEL WITH GFR
ALT: 9 U/L (ref 0–44)
AST: 24 U/L (ref 15–41)
Albumin: 4.3 g/dL (ref 3.5–5.0)
Alkaline Phosphatase: 83 U/L (ref 38–126)
Anion gap: 16 — ABNORMAL HIGH (ref 5–15)
BUN: 10 mg/dL (ref 8–23)
CO2: 26 mmol/L (ref 22–32)
Calcium: 9.9 mg/dL (ref 8.9–10.3)
Chloride: 98 mmol/L (ref 98–111)
Creatinine, Ser: 0.98 mg/dL (ref 0.44–1.00)
GFR, Estimated: 55 mL/min — ABNORMAL LOW (ref >60)
Glucose, Bld: 179 mg/dL — ABNORMAL HIGH (ref 70–99)
Potassium: 2.9 mmol/L — ABNORMAL LOW (ref 3.5–5.1)
Sodium: 140 mmol/L (ref 135–145)
Total Bilirubin: 0.6 mg/dL (ref 0.0–1.2)
Total Protein: 7.3 g/dL (ref 6.5–8.1)

## 2024-10-17 MED ORDER — IOHEXOL 300 MG/ML  SOLN
75.0000 mL | Freq: Once | INTRAMUSCULAR | Status: AC | PRN
  Administered 2024-10-17: 75 mL via INTRAVENOUS

## 2024-10-23 ENCOUNTER — Inpatient Hospital Stay: Admitting: Hematology

## 2024-10-23 VITALS — BP 126/67 | HR 88 | Temp 98.7°F | Resp 19 | Ht 61.0 in | Wt 90.0 lb

## 2024-10-23 DIAGNOSIS — Z85118 Personal history of other malignant neoplasm of bronchus and lung: Secondary | ICD-10-CM | POA: Diagnosis not present

## 2024-10-23 DIAGNOSIS — C3491 Malignant neoplasm of unspecified part of right bronchus or lung: Secondary | ICD-10-CM

## 2024-10-23 MED ORDER — POTASSIUM CHLORIDE CRYS ER 20 MEQ PO TBCR: 20.0000 meq | EXTENDED_RELEASE_TABLET | Freq: Two times a day (BID) | ORAL | 0 refills | Status: AC

## 2024-10-23 NOTE — Progress Notes (Signed)
 [facility]   [phone] [phone]   Clinic Follow up Note   Patient Care Team: Shona Norleen PEDLAR, MD as PCP - General (Internal Medicine) Celestia Joesph SQUIBB, RN as Oncology Nurse Navigator (Medical Oncology)  Date of Service:  10/23/2024  CHIEF COMPLAINT: f/u of non-small cell lung  CURRENT THERAPY:  Cancer surveillance  Assessment & Plan Stage II non-small cell lung cancer of the right lung, post-radiation, also reviewed with -Diagnosed in February 2024, with a hypermetabolic 4.5 cm mass in the right middle lobe, no nodal or distant metastasis.  Biopsy was negative, however given high clinical suspicion, she proceeded with radiation treatment, followed by surveillance. -Her disease remains stable post-radiation, with surveillance imaging demonstrating unchanged small nodules in the right lower lobe of lung and borderline enlarged mediastinal lymph nodes. She coughs some due to COPD, with no new symptoms or decline in functional status, and maintains an ECOG Performance Status of 1.  -Continued surveillance is appropriate given her age and stable findings. PET scan was considered but deferred in favor of CT imaging due to absence of concerning features. - Reviewed recent and previous imaging in person with patient and her son. - Ordered CT chest in approximately 6 months for ongoing surveillance. - Provided anticipatory guidance regarding symptoms such as cough, dyspnea, or chest discomfort, and instructed her to report these if she occurs. - Scheduled follow-up in six months. - Instructed her to complete laboratory studies and CT chest one week prior to next oncology visit.  Hypokalemia She has persistent hypokalemia, likely secondary to chronic furosemide  use for edema. Recent laboratory testing showed a further decrease in potassium. She is able to swallow larger tablets and agrees to prescription potassium supplementation. - Prescribed potassium  supplementation: one tablet twice daily for one week, then once daily until repeat laboratory assessment. - Requested repeat potassium level in 2-4 weeks to be performed at her primary care provider's office. - Communicated with her primary care provider (Dr. Darlyn Shona) regarding the need for ongoing potassium monitoring and sent laboratory results and recommendations.  Plan - Lab and CT scan reviewed, which showed stable small lung nodule and borderline enlarged stable mediastinal nodes - Continue surveillance, follow-up in 6 months with lab and CT chest without contrast 1 week before - I called in oral potassium she will repeat BMP with PCP in 2 to 4 weeks - Will copy her PCP Dr. Shona   Discussed the use of AI scribe software for clinical note transcription with the patient, who gave verbal consent to proceed.  History of Present Illness Kathleen Pope is an 89 year old female with stage II non-small cell lung cancer of the right lung, status post radiation therapy, who presents for routine oncology follow-up.  She was diagnosed with stage II non-small cell lung cancer of the right lung in early 2024, completed five fractions of radiation therapy, and has not received chemotherapy. She has had surveillance imaging every three to six months.  She has no chest discomfort, no new or worsening cough, no increased dyspnea, and reports no recent changes in her condition. Her energy is appropriate for her age. She is independent in activities of daily living and lives alone with family support.  She has COPD with a stable chronic cough and no new respiratory symptoms. She is not using an Advair inhaler but is interested in restarting it.     All other systems were reviewed with the patient and are negative.  MEDICAL HISTORY:  Past Medical History:  Diagnosis Date   Acute blood loss anemia 08/18/2012   Acute hypoxic respiratory failure (HCC) 05/12/2022   Anemia    pt denies, yet takes iron     AVM (arteriovenous malformation) of colon 08/18/2012   Benign neoplasm of ascending colon    Benign neoplasm of cecum    Cavitating mass in right upper lung lobe 01/02/2023   Chronic airway obstruction, not elsewhere classified    Complication of anesthesia    COPD (chronic obstructive pulmonary disease) (HCC)    De Quervain's disease (tenosynovitis) 03/14/2014   Diverticulosis    External hemorrhoids 03/08/2013   GI hemorrhage from post-treatment cecal ulcer 08/18/2012   Heart murmur    systolic with bil rad into carotids   Hemorrhoids    History of radiation therapy    5 cylces stereotactic body radiotherapy   HTN (hypertension)    Hypercholesteremia    Hyponatremia 07/19/2023   Lower GI bleed multiple   AVM's   Lung cancer (HCC) 02/14/2023   Lung mass 02/05/2023   Oxygen deficiency    PONV (postoperative nausea and vomiting)    Pressure injury of skin 07/19/2023   Prolonged QT interval 08/30/2023   Severe sepsis (HCC) 08/30/2023    SURGICAL HISTORY: Past Surgical History:  Procedure Laterality Date   BRONCHIAL BIOPSY  02/14/2023   Procedure: BRONCHIAL BIOPSIES;  Surgeon: Shelah Lamar RAMAN, MD;  Location: [facility];  Service: Pulmonary;;   BRONCHIAL BRUSHINGS  02/14/2023   Procedure: BRONCHIAL BRUSHINGS;  Surgeon: Shelah Lamar RAMAN, MD;  Location: [facility] ENDOSCOPY;  Service: Pulmonary;;   BRONCHIAL NEEDLE ASPIRATION BIOPSY  02/14/2023   Procedure: BRONCHIAL NEEDLE ASPIRATION BIOPSIES;  Surgeon: Shelah Lamar RAMAN, MD;  Location: [facility];  Service: Pulmonary;;   BRONCHIAL WASHINGS  02/14/2023   Procedure: BRONCHIAL WASHINGS;  Surgeon: Shelah Lamar RAMAN, MD;  Location: [facility] ENDOSCOPY;  Service: Pulmonary;;   BUNIONECTOMY     left   COLECTOMY     COLON RESECTION N/A 01/17/2021   Procedure: EXPLORATORY LAPAROTOMY COMPLETION COLECTOMY ileostomy;  Surgeon: Rubin Calamity, MD;  Location: [facility];  Service: General;  Laterality: N/A;   COLONOSCOPY  08/10/2012   Procedure:  COLONOSCOPY;  Surgeon: Gwendlyn ONEIDA Buddy, MD,FACG;  Location: [facility];  Service: Endoscopy;  Laterality: N/A;   COLONOSCOPY  08/19/2012   Procedure: COLONOSCOPY;  Surgeon: Lupita FORBES Commander, MD;  Location: [facility];  Service: Endoscopy;  Laterality: N/A;   COLONOSCOPY N/A 03/11/2013   Procedure: COLONOSCOPY;  Surgeon: Norleen LOISE Kiang, MD;  Location: [facility];  Service: Endoscopy;  Laterality: N/A;   COLONOSCOPY N/A 01/16/2021   Procedure: COLONOSCOPY;  Surgeon: Legrand Victory LITTIE DOUGLAS, MD;  Location: [facility];  Service: Gastroenterology;  Laterality: N/A;   COLONOSCOPY WITH PROPOFOL  N/A 01/10/2021   Procedure: COLONOSCOPY WITH PROPOFOL ;  Surgeon: Kiang Norleen LOISE, MD;  Location: [facility];  Service: Endoscopy;  Laterality: N/A;   COLOSTOMY N/A 01/15/2021   Procedure: COLOSTOMY;  Surgeon: Vanderbilt Ned, MD;  Location: [facility];  Service: General;  Laterality: N/A;   ESOPHAGOGASTRODUODENOSCOPY (EGD) WITH PROPOFOL  N/A 01/14/2021   Procedure: ESOPHAGOGASTRODUODENOSCOPY (EGD) WITH PROPOFOL ;  Surgeon: Legrand Victory LITTIE DOUGLAS, MD;  Location: [facility];  Service: Endoscopy;  Laterality: N/A;   FIDUCIAL MARKER PLACEMENT  02/14/2023   Procedure: FIDUCIAL MARKER PLACEMENT;  Surgeon: Shelah Lamar RAMAN, MD;  Location: [facility] ENDOSCOPY;  Service: Pulmonary;;   FLEXIBLE SIGMOIDOSCOPY N/A 01/14/2021   Procedure: ENID MORIN;  Surgeon: Legrand Victory LITTIE DOUGLAS, MD;  Location: [facility];  Service: Endoscopy;  Laterality: N/A;   HIP ARTHROPLASTY Left 08/31/2023   Procedure: ARTHROPLASTY BIPOLAR HIP (HEMIARTHROPLASTY);  Surgeon: Onesimo Oneil LABOR, MD;  Location: [facility];  Service: Orthopedics;  Laterality: Left;   HOT HEMOSTASIS N/A 01/10/2021   Procedure: HOT HEMOSTASIS (ARGON PLASMA COAGULATION/BICAP);  Surgeon: Abran Norleen SAILOR, MD;  Location: [facility];  Service: Endoscopy;  Laterality: N/A;   IR ANGIOGRAM SELECTIVE EACH ADDITIONAL VESSEL  01/12/2021   IR ANGIOGRAM SELECTIVE EACH ADDITIONAL VESSEL  01/12/2021    IR ANGIOGRAM SELECTIVE EACH ADDITIONAL VESSEL  01/12/2021   IR ANGIOGRAM VISCERAL SELECTIVE  01/09/2021   IR ANGIOGRAM VISCERAL SELECTIVE  01/12/2021   IR EMBO ARTERIAL NOT HEMORR HEMANG INC GUIDE ROADMAPPING  01/12/2021   IR US  GUIDE VASC ACCESS RIGHT  01/09/2021   IR US  GUIDE VASC ACCESS RIGHT  01/12/2021   JOINT REPLACEMENT     PARTIAL COLECTOMY N/A 01/15/2021   Procedure: PARTIAL COLECTOMY;  Surgeon: Vanderbilt Ned, MD;  Location: [facility];  Service: General;  Laterality: N/A;   POLYPECTOMY  01/10/2021   Procedure: POLYPECTOMY;  Surgeon: Abran Norleen SAILOR, MD;  Location: [facility];  Service: Endoscopy;;   SKIN CANCER EXCISION Right 12/2022   Right Leg above knee   TONSILLECTOMY     VIDEO BRONCHOSCOPY WITH ENDOBRONCHIAL ULTRASOUND Bilateral 02/14/2023   Procedure: VIDEO BRONCHOSCOPY WITH ENDOBRONCHIAL ULTRASOUND;  Surgeon: Shelah Lamar RAMAN, MD;  Location: [facility];  Service: Pulmonary;  Laterality: Bilateral;   VIDEO BRONCHOSCOPY WITH RADIAL ENDOBRONCHIAL ULTRASOUND  02/14/2023   Procedure: VIDEO BRONCHOSCOPY WITH RADIAL ENDOBRONCHIAL ULTRASOUND;  Surgeon: Shelah Lamar RAMAN, MD;  Location: [facility];  Service: Pulmonary;;    I have reviewed the social history and family history with the patient and they are unchanged from previous note.  ALLERGIES:  has no known allergies.  MEDICATIONS:  Current Outpatient Medications  Medication Sig Dispense Refill   acetaminophen  (TYLENOL ) 500 MG tablet Take 2 tablets (1,000 mg total) by mouth every 6 (six) hours as needed for moderate pain or mild pain.     amLODipine  (NORVASC ) 5 MG tablet Take 5 mg by mouth daily.     ECHINACEA PO Take 167 mg by mouth daily.     Ferrous Sulfate  27 MG TABS Take 54 mg by mouth daily with breakfast.     fluticasone -salmeterol (ADVAIR) 100-50 MCG/ACT AEPB Inhale 1 puff into the lungs 2 (two) times daily. 60 each 3   furosemide  (LASIX ) 40 MG tablet Take 40 mg by mouth 2 (two) times daily.     gabapentin   (NEURONTIN ) 300 MG capsule Take by mouth 2 (two) times daily.     losartan (COZAAR) 50 MG tablet Take 50 mg by mouth daily.     metoprolol  succinate (TOPROL -XL) 25 MG 24 hr tablet Take 1 tablet every day by oral route.     metoprolol  tartrate (LOPRESSOR ) 25 MG tablet Take 0.5 tablets (12.5 mg total) by mouth 2 (two) times daily. 30 tablet 0   Multiple Vitamins-Minerals (MULTIVITAMIN WITH MINERALS) tablet Take 1 tablet by mouth daily. Centrum Silver     nicotine  (NICODERM CQ  - DOSED IN MG/24 HOURS) 14 mg/24hr patch Place 1 patch (14 mg total) onto the skin daily. 28 patch 0   Omega-3 Fatty Acids  (FISH OIL) 1000 MG CAPS Take 1,000 Units by mouth daily.     Potassium 99 MG TABS Take 1 tablet (99 mg total) by mouth daily at 2 PM. 30 tablet 0   potassium chloride  SA (KLOR-CON  M) 20 MEQ tablet  Take 1 tablet (20 mEq total) by mouth 2 (two) times daily. For one week then change to 1 tab daily 60 tablet 0   pravastatin  (PRAVACHOL ) 10 MG tablet Take 1 tablet (10 mg total) by mouth daily. 30 tablet 0   No current facility-administered medications for this visit.    PHYSICAL EXAMINATION: ECOG PERFORMANCE STATUS: 1 - Symptomatic but completely ambulatory  Vitals:   10/23/24 1031  BP: 126/67  Pulse: 88  Resp: 19  Temp: 98.7 F (37.1 C)  SpO2: 99%   Wt Readings from Last 3 Encounters:  10/23/24 90 lb (40.8 kg)  07/11/24 90 lb 2.7 oz (40.9 kg)  10/21/23 86 lb 6.7 oz (39.2 kg)     GENERAL:alert, no distress and comfortable SKIN: skin color, texture, turgor are normal, no rashes or significant lesions EYES: normal, Conjunctiva are pink and non-injected, sclera clear NECK: supple, thyroid  normal size, non-tender, without nodularity LYMPH:  no palpable lymphadenopathy in the cervical, axillary  LUNGS: clear to auscultation and percussion with normal breathing effort HEART: regular rate & rhythm and no murmurs and no lower extremity edema ABDOMEN:abdomen soft, non-tender and normal bowel  sounds Musculoskeletal:no cyanosis of digits and no clubbing  NEURO: alert & oriented x 3 with fluent speech, no focal motor/sensory deficits  Physical Exam    LABORATORY DATA:  I have reviewed the data as listed    Latest Ref Rng & Units 10/17/2024    1:10 PM 07/04/2024    2:56 PM 12/29/2023    1:58 PM  CBC  WBC 4.0 - 10.5 K/uL 7.5  7.5  6.6   Hemoglobin 12.0 - 15.0 g/dL 85.7  86.1  86.1   Hematocrit 36.0 - 46.0 % 42.0  40.0  40.3   Platelets 150 - 400 K/uL 265  271  280         Latest Ref Rng & Units 10/17/2024    1:10 PM 07/04/2024    2:56 PM 12/29/2023    1:58 PM  CMP  Glucose 70 - 99 mg/dL 820  868  870   BUN 8 - 23 mg/dL 10  20  14    Creatinine 0.44 - 1.00 mg/dL 9.01  8.97  9.15   Sodium 135 - 145 mmol/L 140  141  132   Potassium 3.5 - 5.1 mmol/L 2.9  3.3  4.1   Chloride 98 - 111 mmol/L 98  101  95   CO2 22 - 32 mmol/L 26  28  28    Calcium  8.9 - 10.3 mg/dL 9.9  9.6  9.4   Total Protein 6.5 - 8.1 g/dL 7.3  7.0  7.2   Total Bilirubin 0.0 - 1.2 mg/dL 0.6  0.6  0.9   Alkaline Phos 38 - 126 U/L 83  70  86   AST 15 - 41 U/L 24  24  28    ALT 0 - 44 U/L 9  15  18        RADIOGRAPHIC STUDIES: I have personally reviewed the radiological images as listed and agreed with the findings in the report. No results found.    Orders Placed This Encounter  Procedures   CT Chest Wo Contrast    Standing Status:   Future    Expected Date:   04/16/2025    Expiration Date:   10/23/2025    Preferred imaging location?:   [facility]   All questions were answered. The patient knows to call the clinic with any problems, questions or concerns. No  barriers to learning was detected. The total time spent in the appointment was 30 minutes, including review of chart and various tests results, discussions about plan of care and coordination of care plan     Onita Mattock, MD 10/23/2024

## 2024-10-24 ENCOUNTER — Inpatient Hospital Stay: Admitting: Oncology

## 2024-11-12 ENCOUNTER — Encounter: Payer: Self-pay | Admitting: *Deleted

## 2025-04-24 ENCOUNTER — Inpatient Hospital Stay

## 2025-04-24 ENCOUNTER — Ambulatory Visit (HOSPITAL_COMMUNITY)

## 2025-04-30 ENCOUNTER — Inpatient Hospital Stay: Admitting: Oncology

## 2025-05-01 ENCOUNTER — Inpatient Hospital Stay: Admitting: Oncology
